# Patient Record
Sex: Female | Born: 1940 | Race: Black or African American | Hispanic: No | Smoking: Former smoker
Health system: Southern US, Community
[De-identification: ages and names within clinical notes are randomized; demographics above are authoritative.]

## PROBLEM LIST (undated history)

## (undated) DIAGNOSIS — I10 Essential (primary) hypertension: Secondary | ICD-10-CM

## (undated) DIAGNOSIS — R609 Edema, unspecified: Secondary | ICD-10-CM

## (undated) DIAGNOSIS — I2510 Atherosclerotic heart disease of native coronary artery without angina pectoris: Secondary | ICD-10-CM

## (undated) DIAGNOSIS — G609 Hereditary and idiopathic neuropathy, unspecified: Secondary | ICD-10-CM

## (undated) DIAGNOSIS — C8590 Non-Hodgkin lymphoma, unspecified, unspecified site: Secondary | ICD-10-CM

## (undated) DIAGNOSIS — K219 Gastro-esophageal reflux disease without esophagitis: Secondary | ICD-10-CM

## (undated) DIAGNOSIS — I739 Peripheral vascular disease, unspecified: Secondary | ICD-10-CM

## (undated) DIAGNOSIS — I509 Heart failure, unspecified: Secondary | ICD-10-CM

## (undated) DIAGNOSIS — E7800 Pure hypercholesterolemia, unspecified: Secondary | ICD-10-CM

## (undated) DIAGNOSIS — R1902 Left upper quadrant abdominal swelling, mass and lump: Secondary | ICD-10-CM

## (undated) DIAGNOSIS — I482 Chronic atrial fibrillation, unspecified: Secondary | ICD-10-CM

## (undated) DIAGNOSIS — M109 Gout, unspecified: Secondary | ICD-10-CM

## (undated) DIAGNOSIS — N189 Chronic kidney disease, unspecified: Secondary | ICD-10-CM

## (undated) DIAGNOSIS — Q2543 Congenital aneurysm of aorta: Secondary | ICD-10-CM

## (undated) DIAGNOSIS — Q2549 Other congenital malformations of aorta: Secondary | ICD-10-CM

## (undated) DIAGNOSIS — F0150 Vascular dementia without behavioral disturbance: Secondary | ICD-10-CM

## (undated) DIAGNOSIS — H548 Legal blindness, as defined in USA: Secondary | ICD-10-CM

## (undated) DIAGNOSIS — R32 Unspecified urinary incontinence: Secondary | ICD-10-CM

## (undated) DIAGNOSIS — I639 Cerebral infarction, unspecified: Secondary | ICD-10-CM

## (undated) DIAGNOSIS — R569 Unspecified convulsions: Secondary | ICD-10-CM

## (undated) HISTORY — DX: Pure hypercholesterolemia, unspecified: E78.00

## (undated) HISTORY — DX: Left upper quadrant abdominal swelling, mass and lump: R19.02

## (undated) HISTORY — DX: Hereditary and idiopathic neuropathy, unspecified: G60.9

## (undated) HISTORY — DX: Essential (primary) hypertension: I10

## (undated) HISTORY — DX: Unspecified convulsions: R56.9

## (undated) HISTORY — PX: OTHER SURGICAL HISTORY: SHX169

## (undated) HISTORY — DX: Unspecified urinary incontinence: R32

## (undated) HISTORY — DX: Peripheral vascular disease, unspecified: I73.9

## (undated) HISTORY — DX: Atherosclerotic heart disease of native coronary artery without angina pectoris: I25.10

## (undated) HISTORY — DX: Vascular dementia, unspecified severity, without behavioral disturbance, psychotic disturbance, mood disturbance, and anxiety: F01.50

## (undated) HISTORY — DX: Legal blindness, as defined in USA: H54.8

## (undated) HISTORY — DX: Gout, unspecified: M10.9

## (undated) HISTORY — DX: Cerebral infarction, unspecified: I63.9

## (undated) HISTORY — DX: Edema, unspecified: R60.9

## (undated) HISTORY — DX: Chronic kidney disease, unspecified: N18.9

## (undated) HISTORY — DX: Heart failure, unspecified: I50.9

## (undated) HISTORY — PX: TUBAL LIGATION: SHX77

## (undated) HISTORY — DX: Chronic atrial fibrillation, unspecified: I48.20

---

## 1997-08-12 ENCOUNTER — Other Ambulatory Visit: Admission: RE | Admit: 1997-08-12 | Discharge: 1997-08-12 | Payer: Self-pay | Admitting: Obstetrics and Gynecology

## 1997-08-27 ENCOUNTER — Ambulatory Visit (HOSPITAL_COMMUNITY): Admission: RE | Admit: 1997-08-27 | Discharge: 1997-08-27 | Payer: Self-pay | Admitting: Hematology and Oncology

## 1997-08-27 ENCOUNTER — Ambulatory Visit (HOSPITAL_COMMUNITY): Admission: RE | Admit: 1997-08-27 | Discharge: 1997-08-27 | Payer: Self-pay | Admitting: Gastroenterology

## 1997-09-29 ENCOUNTER — Encounter: Payer: Self-pay | Admitting: Cardiology

## 1997-09-29 ENCOUNTER — Emergency Department (HOSPITAL_COMMUNITY): Admission: EM | Admit: 1997-09-29 | Discharge: 1997-09-29 | Payer: Self-pay | Admitting: Emergency Medicine

## 1998-04-24 ENCOUNTER — Inpatient Hospital Stay (HOSPITAL_COMMUNITY): Admission: EM | Admit: 1998-04-24 | Discharge: 1998-04-29 | Payer: Self-pay | Admitting: Emergency Medicine

## 1998-04-24 ENCOUNTER — Encounter: Payer: Self-pay | Admitting: Emergency Medicine

## 1998-04-27 ENCOUNTER — Encounter: Payer: Self-pay | Admitting: Cardiology

## 1999-03-11 ENCOUNTER — Inpatient Hospital Stay (HOSPITAL_COMMUNITY): Admission: EM | Admit: 1999-03-11 | Discharge: 1999-03-15 | Payer: Self-pay | Admitting: Emergency Medicine

## 1999-03-11 ENCOUNTER — Encounter: Payer: Self-pay | Admitting: Emergency Medicine

## 1999-03-12 ENCOUNTER — Encounter: Payer: Self-pay | Admitting: Cardiology

## 1999-03-13 ENCOUNTER — Encounter: Payer: Self-pay | Admitting: Cardiology

## 1999-06-07 ENCOUNTER — Encounter: Admission: RE | Admit: 1999-06-07 | Discharge: 1999-06-07 | Payer: Self-pay | Admitting: Hematology and Oncology

## 1999-06-07 ENCOUNTER — Encounter: Payer: Self-pay | Admitting: Hematology and Oncology

## 1999-07-07 ENCOUNTER — Other Ambulatory Visit: Admission: RE | Admit: 1999-07-07 | Discharge: 1999-07-07 | Payer: Self-pay | Admitting: Obstetrics and Gynecology

## 2001-08-22 ENCOUNTER — Emergency Department (HOSPITAL_COMMUNITY): Admission: EM | Admit: 2001-08-22 | Discharge: 2001-08-22 | Payer: Self-pay | Admitting: *Deleted

## 2001-08-22 ENCOUNTER — Encounter: Payer: Self-pay | Admitting: *Deleted

## 2001-10-19 ENCOUNTER — Inpatient Hospital Stay (HOSPITAL_COMMUNITY): Admission: EM | Admit: 2001-10-19 | Discharge: 2001-10-25 | Payer: Self-pay | Admitting: Emergency Medicine

## 2001-10-20 ENCOUNTER — Encounter: Payer: Self-pay | Admitting: Cardiology

## 2001-10-27 ENCOUNTER — Inpatient Hospital Stay (HOSPITAL_COMMUNITY): Admission: EM | Admit: 2001-10-27 | Discharge: 2001-10-30 | Payer: Self-pay | Admitting: Emergency Medicine

## 2001-10-27 ENCOUNTER — Encounter: Payer: Self-pay | Admitting: Emergency Medicine

## 2001-10-28 ENCOUNTER — Encounter: Payer: Self-pay | Admitting: Cardiology

## 2001-10-30 ENCOUNTER — Inpatient Hospital Stay (HOSPITAL_COMMUNITY)
Admission: RE | Admit: 2001-10-30 | Discharge: 2001-11-25 | Payer: Self-pay | Admitting: Physical Medicine & Rehabilitation

## 2001-11-23 ENCOUNTER — Encounter: Payer: Self-pay | Admitting: Physical Medicine & Rehabilitation

## 2001-12-23 ENCOUNTER — Encounter: Admission: RE | Admit: 2001-12-23 | Discharge: 2002-02-11 | Payer: Self-pay | Admitting: Cardiology

## 2002-01-04 ENCOUNTER — Encounter: Payer: Self-pay | Admitting: Emergency Medicine

## 2002-01-04 ENCOUNTER — Inpatient Hospital Stay (HOSPITAL_COMMUNITY): Admission: EM | Admit: 2002-01-04 | Discharge: 2002-01-12 | Payer: Self-pay | Admitting: Emergency Medicine

## 2002-01-05 ENCOUNTER — Encounter: Payer: Self-pay | Admitting: Cardiology

## 2002-01-06 ENCOUNTER — Encounter: Payer: Self-pay | Admitting: Cardiology

## 2002-02-12 ENCOUNTER — Encounter
Admission: RE | Admit: 2002-02-12 | Discharge: 2002-05-13 | Payer: Self-pay | Admitting: Physical Medicine & Rehabilitation

## 2002-03-24 ENCOUNTER — Ambulatory Visit (HOSPITAL_COMMUNITY)
Admission: RE | Admit: 2002-03-24 | Discharge: 2002-03-24 | Payer: Self-pay | Admitting: Physical Medicine & Rehabilitation

## 2002-05-14 ENCOUNTER — Encounter
Admission: RE | Admit: 2002-05-14 | Discharge: 2002-08-12 | Payer: Self-pay | Admitting: Physical Medicine & Rehabilitation

## 2002-05-27 ENCOUNTER — Inpatient Hospital Stay (HOSPITAL_COMMUNITY): Admission: AD | Admit: 2002-05-27 | Discharge: 2002-06-08 | Payer: Self-pay | Admitting: *Deleted

## 2002-05-27 ENCOUNTER — Encounter: Payer: Self-pay | Admitting: *Deleted

## 2002-05-30 HISTORY — PX: FEMORAL-POPLITEAL BYPASS GRAFT: SHX937

## 2002-06-08 ENCOUNTER — Encounter: Payer: Self-pay | Admitting: *Deleted

## 2002-06-08 ENCOUNTER — Inpatient Hospital Stay: Admission: RE | Admit: 2002-06-08 | Discharge: 2002-06-20 | Payer: Self-pay | Admitting: Internal Medicine

## 2002-06-16 ENCOUNTER — Encounter: Payer: Self-pay | Admitting: Internal Medicine

## 2002-06-16 ENCOUNTER — Encounter (HOSPITAL_COMMUNITY): Admission: RE | Admit: 2002-06-16 | Discharge: 2002-06-18 | Payer: Self-pay | Admitting: Internal Medicine

## 2002-07-03 ENCOUNTER — Ambulatory Visit (HOSPITAL_COMMUNITY): Admission: RE | Admit: 2002-07-03 | Discharge: 2002-07-03 | Payer: Self-pay | Admitting: Internal Medicine

## 2002-07-06 ENCOUNTER — Encounter: Admission: RE | Admit: 2002-07-06 | Discharge: 2002-10-04 | Payer: Self-pay | Admitting: *Deleted

## 2002-07-14 ENCOUNTER — Encounter
Admission: RE | Admit: 2002-07-14 | Discharge: 2002-10-12 | Payer: Self-pay | Admitting: Physical Medicine & Rehabilitation

## 2002-07-28 ENCOUNTER — Encounter (HOSPITAL_BASED_OUTPATIENT_CLINIC_OR_DEPARTMENT_OTHER): Admission: RE | Admit: 2002-07-28 | Discharge: 2002-10-26 | Payer: Self-pay | Admitting: Internal Medicine

## 2002-10-05 ENCOUNTER — Encounter: Payer: Self-pay | Admitting: Emergency Medicine

## 2002-10-06 ENCOUNTER — Inpatient Hospital Stay (HOSPITAL_COMMUNITY): Admission: EM | Admit: 2002-10-06 | Discharge: 2002-10-14 | Payer: Self-pay | Admitting: Emergency Medicine

## 2002-10-08 ENCOUNTER — Encounter: Payer: Self-pay | Admitting: Cardiology

## 2002-10-30 ENCOUNTER — Encounter (HOSPITAL_BASED_OUTPATIENT_CLINIC_OR_DEPARTMENT_OTHER): Admission: RE | Admit: 2002-10-30 | Discharge: 2003-01-28 | Payer: Self-pay | Admitting: Internal Medicine

## 2002-11-30 HISTORY — PX: FEMORAL-POPLITEAL BYPASS GRAFT: SHX937

## 2002-12-09 ENCOUNTER — Inpatient Hospital Stay (HOSPITAL_COMMUNITY): Admission: AD | Admit: 2002-12-09 | Discharge: 2002-12-22 | Payer: Self-pay | Admitting: *Deleted

## 2003-02-11 ENCOUNTER — Encounter (HOSPITAL_BASED_OUTPATIENT_CLINIC_OR_DEPARTMENT_OTHER): Admission: RE | Admit: 2003-02-11 | Discharge: 2003-03-12 | Payer: Self-pay | Admitting: Internal Medicine

## 2003-05-30 ENCOUNTER — Emergency Department (HOSPITAL_COMMUNITY): Admission: EM | Admit: 2003-05-30 | Discharge: 2003-05-31 | Payer: Self-pay | Admitting: Emergency Medicine

## 2003-06-02 ENCOUNTER — Encounter (HOSPITAL_BASED_OUTPATIENT_CLINIC_OR_DEPARTMENT_OTHER): Admission: RE | Admit: 2003-06-02 | Discharge: 2003-06-18 | Payer: Self-pay | Admitting: Internal Medicine

## 2003-09-15 ENCOUNTER — Encounter (HOSPITAL_BASED_OUTPATIENT_CLINIC_OR_DEPARTMENT_OTHER): Admission: RE | Admit: 2003-09-15 | Discharge: 2003-10-01 | Payer: Self-pay | Admitting: Internal Medicine

## 2003-12-01 ENCOUNTER — Ambulatory Visit (HOSPITAL_COMMUNITY): Admission: RE | Admit: 2003-12-01 | Discharge: 2003-12-01 | Payer: Self-pay | Admitting: Gastroenterology

## 2003-12-01 ENCOUNTER — Encounter (INDEPENDENT_AMBULATORY_CARE_PROVIDER_SITE_OTHER): Payer: Self-pay | Admitting: *Deleted

## 2003-12-28 ENCOUNTER — Encounter (HOSPITAL_BASED_OUTPATIENT_CLINIC_OR_DEPARTMENT_OTHER): Admission: RE | Admit: 2003-12-28 | Discharge: 2004-01-18 | Payer: Self-pay | Admitting: Internal Medicine

## 2004-04-05 ENCOUNTER — Encounter (HOSPITAL_BASED_OUTPATIENT_CLINIC_OR_DEPARTMENT_OTHER): Admission: RE | Admit: 2004-04-05 | Discharge: 2004-04-14 | Payer: Self-pay | Admitting: Internal Medicine

## 2006-03-12 ENCOUNTER — Emergency Department (HOSPITAL_COMMUNITY): Admission: EM | Admit: 2006-03-12 | Discharge: 2006-03-13 | Payer: Self-pay | Admitting: Emergency Medicine

## 2006-07-19 ENCOUNTER — Ambulatory Visit: Payer: Self-pay | Admitting: *Deleted

## 2007-02-06 ENCOUNTER — Ambulatory Visit: Payer: Self-pay | Admitting: *Deleted

## 2007-08-07 ENCOUNTER — Ambulatory Visit: Payer: Self-pay | Admitting: *Deleted

## 2007-11-06 ENCOUNTER — Encounter: Admission: RE | Admit: 2007-11-06 | Discharge: 2007-11-06 | Payer: Self-pay | Admitting: *Deleted

## 2008-02-12 ENCOUNTER — Ambulatory Visit: Payer: Self-pay | Admitting: *Deleted

## 2008-02-18 ENCOUNTER — Ambulatory Visit (HOSPITAL_COMMUNITY): Admission: RE | Admit: 2008-02-18 | Discharge: 2008-02-18 | Payer: Self-pay | Admitting: *Deleted

## 2008-02-18 ENCOUNTER — Ambulatory Visit: Payer: Self-pay | Admitting: *Deleted

## 2008-04-01 ENCOUNTER — Ambulatory Visit: Payer: Self-pay | Admitting: *Deleted

## 2008-08-26 ENCOUNTER — Ambulatory Visit: Payer: Self-pay | Admitting: *Deleted

## 2008-09-01 ENCOUNTER — Ambulatory Visit (HOSPITAL_COMMUNITY): Admission: RE | Admit: 2008-09-01 | Discharge: 2008-09-01 | Payer: Self-pay | Admitting: *Deleted

## 2008-09-01 ENCOUNTER — Ambulatory Visit: Payer: Self-pay | Admitting: *Deleted

## 2008-09-01 HISTORY — PX: AORTOGRAM: SHX6300

## 2008-09-03 ENCOUNTER — Inpatient Hospital Stay (HOSPITAL_COMMUNITY): Admission: RE | Admit: 2008-09-03 | Discharge: 2008-09-09 | Payer: Self-pay | Admitting: Vascular Surgery

## 2008-09-04 ENCOUNTER — Encounter: Payer: Self-pay | Admitting: Vascular Surgery

## 2008-09-28 ENCOUNTER — Ambulatory Visit: Payer: Self-pay | Admitting: Vascular Surgery

## 2008-12-08 ENCOUNTER — Inpatient Hospital Stay (HOSPITAL_COMMUNITY): Admission: RE | Admit: 2008-12-08 | Discharge: 2008-12-15 | Payer: Self-pay | Admitting: Emergency Medicine

## 2008-12-10 ENCOUNTER — Encounter (INDEPENDENT_AMBULATORY_CARE_PROVIDER_SITE_OTHER): Payer: Self-pay | Admitting: Neurology

## 2008-12-13 ENCOUNTER — Ambulatory Visit: Payer: Self-pay | Admitting: Physical Medicine & Rehabilitation

## 2008-12-13 ENCOUNTER — Encounter (INDEPENDENT_AMBULATORY_CARE_PROVIDER_SITE_OTHER): Payer: Self-pay | Admitting: Neurology

## 2009-03-16 ENCOUNTER — Ambulatory Visit: Payer: Self-pay | Admitting: Vascular Surgery

## 2009-03-22 ENCOUNTER — Ambulatory Visit: Payer: Self-pay | Admitting: Vascular Surgery

## 2009-09-06 ENCOUNTER — Ambulatory Visit: Payer: Self-pay | Admitting: Vascular Surgery

## 2009-09-14 ENCOUNTER — Ambulatory Visit: Payer: Self-pay | Admitting: Cardiology

## 2009-09-29 ENCOUNTER — Ambulatory Visit: Payer: Self-pay | Admitting: Cardiology

## 2009-10-31 ENCOUNTER — Ambulatory Visit: Payer: Self-pay | Admitting: Cardiology

## 2009-11-29 ENCOUNTER — Ambulatory Visit: Payer: Self-pay | Admitting: Cardiology

## 2009-12-13 ENCOUNTER — Ambulatory Visit: Payer: Self-pay | Admitting: Cardiology

## 2009-12-30 ENCOUNTER — Ambulatory Visit: Payer: Self-pay | Admitting: Cardiology

## 2010-01-18 ENCOUNTER — Ambulatory Visit: Payer: Self-pay | Admitting: Cardiology

## 2010-01-26 ENCOUNTER — Ambulatory Visit: Payer: Self-pay | Admitting: Cardiology

## 2010-02-01 ENCOUNTER — Ambulatory Visit: Payer: Self-pay | Admitting: Cardiology

## 2010-02-08 ENCOUNTER — Ambulatory Visit: Payer: Self-pay | Admitting: Cardiology

## 2010-02-20 ENCOUNTER — Ambulatory Visit: Payer: Self-pay | Admitting: Cardiology

## 2010-02-27 ENCOUNTER — Ambulatory Visit: Payer: Self-pay | Admitting: Cardiology

## 2010-03-13 ENCOUNTER — Encounter (INDEPENDENT_AMBULATORY_CARE_PROVIDER_SITE_OTHER): Payer: Medicare Other

## 2010-03-13 DIAGNOSIS — I119 Hypertensive heart disease without heart failure: Secondary | ICD-10-CM

## 2010-03-13 DIAGNOSIS — I4891 Unspecified atrial fibrillation: Secondary | ICD-10-CM

## 2010-03-28 ENCOUNTER — Ambulatory Visit (INDEPENDENT_AMBULATORY_CARE_PROVIDER_SITE_OTHER): Payer: Medicare Other | Admitting: Cardiology

## 2010-03-28 DIAGNOSIS — Z7901 Long term (current) use of anticoagulants: Secondary | ICD-10-CM

## 2010-03-28 DIAGNOSIS — I4891 Unspecified atrial fibrillation: Secondary | ICD-10-CM

## 2010-03-28 DIAGNOSIS — I6350 Cerebral infarction due to unspecified occlusion or stenosis of unspecified cerebral artery: Secondary | ICD-10-CM

## 2010-03-28 DIAGNOSIS — I119 Hypertensive heart disease without heart failure: Secondary | ICD-10-CM

## 2010-04-26 ENCOUNTER — Ambulatory Visit (INDEPENDENT_AMBULATORY_CARE_PROVIDER_SITE_OTHER): Payer: Medicare Other | Admitting: *Deleted

## 2010-04-26 DIAGNOSIS — I4891 Unspecified atrial fibrillation: Secondary | ICD-10-CM | POA: Insufficient documentation

## 2010-04-26 DIAGNOSIS — Z7901 Long term (current) use of anticoagulants: Secondary | ICD-10-CM

## 2010-04-26 LAB — POCT INR: INR: 2.1

## 2010-05-02 ENCOUNTER — Telehealth: Payer: Self-pay | Admitting: Cardiology

## 2010-05-02 NOTE — Telephone Encounter (Signed)
PT SAID LEFT SOME PAPERS TO BE FILLED OUT AND JUST CHECKING THE STATUS.

## 2010-05-03 LAB — GLUCOSE, CAPILLARY
Glucose-Capillary: 108 mg/dL — ABNORMAL HIGH (ref 70–99)
Glucose-Capillary: 115 mg/dL — ABNORMAL HIGH (ref 70–99)
Glucose-Capillary: 132 mg/dL — ABNORMAL HIGH (ref 70–99)
Glucose-Capillary: 134 mg/dL — ABNORMAL HIGH (ref 70–99)
Glucose-Capillary: 144 mg/dL — ABNORMAL HIGH (ref 70–99)
Glucose-Capillary: 107 mg/dL — ABNORMAL HIGH (ref 70–99)
Glucose-Capillary: 120 mg/dL — ABNORMAL HIGH (ref 70–99)
Glucose-Capillary: 124 mg/dL — ABNORMAL HIGH (ref 70–99)
Glucose-Capillary: 134 mg/dL — ABNORMAL HIGH (ref 70–99)
Glucose-Capillary: 137 mg/dL — ABNORMAL HIGH (ref 70–99)
Glucose-Capillary: 139 mg/dL — ABNORMAL HIGH (ref 70–99)
Glucose-Capillary: 139 mg/dL — ABNORMAL HIGH (ref 70–99)
Glucose-Capillary: 141 mg/dL — ABNORMAL HIGH (ref 70–99)
Glucose-Capillary: 143 mg/dL — ABNORMAL HIGH (ref 70–99)
Glucose-Capillary: 146 mg/dL — ABNORMAL HIGH (ref 70–99)
Glucose-Capillary: 173 mg/dL — ABNORMAL HIGH (ref 70–99)
Glucose-Capillary: 183 mg/dL — ABNORMAL HIGH (ref 70–99)
Glucose-Capillary: 76 mg/dL (ref 70–99)

## 2010-05-03 LAB — COMPREHENSIVE METABOLIC PANEL
ALT: 16 U/L (ref 0–35)
AST: 23 U/L (ref 0–37)
Albumin: 3.7 g/dL (ref 3.5–5.2)
Alkaline Phosphatase: 68 U/L (ref 39–117)
BUN: 19 mg/dL (ref 6–23)
CO2: 25 mEq/L (ref 19–32)
Calcium: 9.5 mg/dL (ref 8.4–10.5)
Chloride: 107 mEq/L (ref 96–112)
Creatinine, Ser: 1.13 mg/dL (ref 0.4–1.2)
GFR calc Af Amer: 58 mL/min — ABNORMAL LOW (ref >60)
GFR calc non Af Amer: 48 mL/min — ABNORMAL LOW (ref >60)
Glucose, Bld: 181 mg/dL — ABNORMAL HIGH (ref 70–99)
Potassium: 4.9 mEq/L (ref 3.5–5.1)
Sodium: 137 mEq/L (ref 135–145)
Total Bilirubin: 0.5 mg/dL (ref 0.3–1.2)
Total Protein: 7.4 g/dL (ref 6.0–8.3)

## 2010-05-03 LAB — CBC
HCT: 39.3 % (ref 36.0–46.0)
Hemoglobin: 13 g/dL (ref 12.0–15.0)
Hemoglobin: 13.9 g/dL (ref 12.0–15.0)
MCHC: 33.1 g/dL (ref 30.0–36.0)
MCV: 84.5 fL (ref 78.0–100.0)
MCV: 85.1 fL (ref 78.0–100.0)
Platelets: 148 10*3/uL — ABNORMAL LOW (ref 150–400)
RBC: 4.61 MIL/uL (ref 3.87–5.11)
RBC: 4.63 MIL/uL (ref 3.87–5.11)
RBC: 4.98 MIL/uL (ref 3.87–5.11)
RDW: 16 % — ABNORMAL HIGH (ref 11.5–15.5)
WBC: 3.6 10*3/uL — ABNORMAL LOW (ref 4.0–10.5)
WBC: 4.5 10*3/uL (ref 4.0–10.5)
WBC: 4.6 10*3/uL (ref 4.0–10.5)

## 2010-05-03 LAB — HEMOGLOBIN A1C
Hgb A1c MFr Bld: 7.4 % — ABNORMAL HIGH (ref 4.6–6.1)
Mean Plasma Glucose: 166 mg/dL

## 2010-05-03 LAB — DIFFERENTIAL
Eosinophils Absolute: 0.2 10*3/uL (ref 0.0–0.7)
Eosinophils Relative: 3 % (ref 0–5)
Lymphs Abs: 1.5 10*3/uL (ref 0.7–4.0)
Monocytes Relative: 13 % — ABNORMAL HIGH (ref 3–12)

## 2010-05-03 LAB — PROTIME-INR
INR: 1.96 — ABNORMAL HIGH (ref 0.00–1.49)
INR: 2.14 — ABNORMAL HIGH (ref 0.00–1.49)
INR: 1.1 (ref 0.00–1.49)
INR: 1.15 (ref 0.00–1.49)
INR: 1.57 — ABNORMAL HIGH (ref 0.00–1.49)
Prothrombin Time: 22.2 seconds — ABNORMAL HIGH (ref 11.6–15.2)
Prothrombin Time: 23.7 seconds — ABNORMAL HIGH (ref 11.6–15.2)
Prothrombin Time: 14.6 seconds (ref 11.6–15.2)
Prothrombin Time: 16.2 seconds — ABNORMAL HIGH (ref 11.6–15.2)
Prothrombin Time: 18.6 seconds — ABNORMAL HIGH (ref 11.6–15.2)

## 2010-05-03 LAB — APTT
aPTT: 28 seconds (ref 24–37)
aPTT: 30 seconds (ref 24–37)

## 2010-05-03 LAB — URINALYSIS, ROUTINE W REFLEX MICROSCOPIC
Bilirubin Urine: NEGATIVE
Glucose, UA: NEGATIVE mg/dL
Hgb urine dipstick: NEGATIVE
Ketones, ur: NEGATIVE mg/dL
Nitrite: NEGATIVE
Protein, ur: NEGATIVE mg/dL
Specific Gravity, Urine: 1.009 (ref 1.005–1.030)
Urobilinogen, UA: 0.2 mg/dL (ref 0.0–1.0)
pH: 5 (ref 5.0–8.0)

## 2010-05-03 LAB — CK TOTAL AND CKMB (NOT AT ARMC)
CK, MB: 1.3 ng/mL (ref 0.3–4.0)
Relative Index: INVALID (ref 0.0–2.5)
Total CK: 93 U/L (ref 7–177)

## 2010-05-03 LAB — LIPID PANEL
Cholesterol: 208 mg/dL — ABNORMAL HIGH (ref 0–200)
HDL: 53 mg/dL (ref >39)
LDL Cholesterol: 126 mg/dL — ABNORMAL HIGH (ref 0–99)
Total CHOL/HDL Ratio: 3.9 RATIO
Triglycerides: 146 mg/dL (ref <150)
VLDL: 29 mg/dL (ref 0–40)

## 2010-05-03 LAB — BASIC METABOLIC PANEL
Calcium: 9.8 mg/dL (ref 8.4–10.5)
Chloride: 105 mEq/L (ref 96–112)
Creatinine, Ser: 1.07 mg/dL (ref 0.4–1.2)
GFR calc Af Amer: >60 mL/min (ref >60)
Sodium: 139 mEq/L (ref 135–145)

## 2010-05-03 LAB — MRSA PCR SCREENING: MRSA by PCR: NEGATIVE

## 2010-05-03 LAB — TROPONIN I: Troponin I: 0.03 ng/mL (ref 0.00–0.06)

## 2010-05-07 LAB — URINALYSIS, ROUTINE W REFLEX MICROSCOPIC
Bilirubin Urine: NEGATIVE
Glucose, UA: 250 mg/dL — AB
Ketones, ur: 15 mg/dL — AB
Ketones, ur: NEGATIVE mg/dL
Nitrite: NEGATIVE
Protein, ur: NEGATIVE mg/dL
Urobilinogen, UA: 1 mg/dL (ref 0.0–1.0)
pH: 6 (ref 5.0–8.0)

## 2010-05-07 LAB — COMPREHENSIVE METABOLIC PANEL
ALT: 23 U/L (ref 0–35)
Calcium: 9.8 mg/dL (ref 8.4–10.5)
GFR calc Af Amer: 54 mL/min — ABNORMAL LOW (ref >60)
Glucose, Bld: 346 mg/dL — ABNORMAL HIGH (ref 70–99)
Sodium: 136 mEq/L (ref 135–145)
Total Protein: 7 g/dL (ref 6.0–8.3)

## 2010-05-07 LAB — GLUCOSE, CAPILLARY
Glucose-Capillary: 162 mg/dL — ABNORMAL HIGH (ref 70–99)
Glucose-Capillary: 170 mg/dL — ABNORMAL HIGH (ref 70–99)
Glucose-Capillary: 170 mg/dL — ABNORMAL HIGH (ref 70–99)
Glucose-Capillary: 179 mg/dL — ABNORMAL HIGH (ref 70–99)
Glucose-Capillary: 186 mg/dL — ABNORMAL HIGH (ref 70–99)
Glucose-Capillary: 195 mg/dL — ABNORMAL HIGH (ref 70–99)
Glucose-Capillary: 215 mg/dL — ABNORMAL HIGH (ref 70–99)
Glucose-Capillary: 218 mg/dL — ABNORMAL HIGH (ref 70–99)
Glucose-Capillary: 260 mg/dL — ABNORMAL HIGH (ref 70–99)
Glucose-Capillary: 262 mg/dL — ABNORMAL HIGH (ref 70–99)

## 2010-05-07 LAB — BASIC METABOLIC PANEL
BUN: 14 mg/dL (ref 6–23)
CO2: 26 mEq/L (ref 19–32)
Calcium: 9.1 mg/dL (ref 8.4–10.5)
Chloride: 104 mEq/L (ref 96–112)
Creatinine, Ser: 1.03 mg/dL (ref 0.4–1.2)
GFR calc Af Amer: >60 mL/min (ref >60)
GFR calc non Af Amer: 53 mL/min — ABNORMAL LOW (ref >60)
Glucose, Bld: 168 mg/dL — ABNORMAL HIGH (ref 70–99)
Potassium: 4.4 mEq/L (ref 3.5–5.1)
Sodium: 135 mEq/L (ref 135–145)

## 2010-05-07 LAB — PROTIME-INR
INR: 1.3 (ref 0.00–1.49)
INR: 1.5 (ref 0.00–1.49)
INR: 1.7 — ABNORMAL HIGH (ref 0.00–1.49)
INR: 2.2 — ABNORMAL HIGH (ref 0.00–1.49)
Prothrombin Time: 14.2 seconds (ref 11.6–15.2)
Prothrombin Time: 16.2 seconds — ABNORMAL HIGH (ref 11.6–15.2)
Prothrombin Time: 16.9 seconds — ABNORMAL HIGH (ref 11.6–15.2)
Prothrombin Time: 17.9 seconds — ABNORMAL HIGH (ref 11.6–15.2)
Prothrombin Time: 19.9 seconds — ABNORMAL HIGH (ref 11.6–15.2)
Prothrombin Time: 22.1 seconds — ABNORMAL HIGH (ref 11.6–15.2)
Prothrombin Time: 24.3 seconds — ABNORMAL HIGH (ref 11.6–15.2)

## 2010-05-07 LAB — URINE MICROSCOPIC-ADD ON

## 2010-05-07 LAB — TYPE AND SCREEN
ABO/RH(D): B POS
Antibody Screen: NEGATIVE

## 2010-05-07 LAB — CBC
HCT: 40.3 % (ref 36.0–46.0)
Hemoglobin: 13.6 g/dL (ref 12.0–15.0)
Hemoglobin: 13.8 g/dL (ref 12.0–15.0)
MCHC: 33.8 g/dL (ref 30.0–36.0)
MCV: 87 fL (ref 78.0–100.0)
RDW: 14.7 % (ref 11.5–15.5)
RDW: 15.2 % (ref 11.5–15.5)

## 2010-05-07 LAB — POCT I-STAT, CHEM 8
Calcium, Ion: 1.23 mmol/L (ref 1.12–1.32)
Glucose, Bld: 129 mg/dL — ABNORMAL HIGH (ref 70–99)
HCT: 45 % (ref 36.0–46.0)
Hemoglobin: 15.3 g/dL — ABNORMAL HIGH (ref 12.0–15.0)
TCO2: 26 mmol/L (ref 0–100)

## 2010-05-09 ENCOUNTER — Telehealth: Payer: Self-pay | Admitting: Cardiology

## 2010-05-09 NOTE — Telephone Encounter (Signed)
Per Dr. Mart Piggs this matter in the coumadin clinic tomorrow.  L/M with Leanna Battles, RN

## 2010-05-09 NOTE — Telephone Encounter (Signed)
Ms.Malone,RN, wants RN to know that the patient presented today with symptoms of scalp, temporal and jaw pain.  Patient has an appointment tomorrow with the Coumadin clinic. Ms.Malone did not know if this may need to be assessed by Dr.Brackbill's RN or not.  She just wanted to let us know.

## 2010-05-10 ENCOUNTER — Ambulatory Visit (INDEPENDENT_AMBULATORY_CARE_PROVIDER_SITE_OTHER): Payer: Medicare Other | Admitting: *Deleted

## 2010-05-10 DIAGNOSIS — Z7901 Long term (current) use of anticoagulants: Secondary | ICD-10-CM

## 2010-05-10 DIAGNOSIS — I4891 Unspecified atrial fibrillation: Secondary | ICD-10-CM

## 2010-05-15 LAB — GLUCOSE, CAPILLARY
Glucose-Capillary: 130 mg/dL — ABNORMAL HIGH (ref 70–99)
Glucose-Capillary: 83 mg/dL (ref 70–99)

## 2010-05-15 LAB — POCT I-STAT, CHEM 8
BUN: 25 mg/dL — ABNORMAL HIGH (ref 6–23)
Calcium, Ion: 1.22 mmol/L (ref 1.12–1.32)
Chloride: 104 mEq/L (ref 96–112)
Potassium: 3.6 mEq/L (ref 3.5–5.1)
Sodium: 142 mEq/L (ref 135–145)

## 2010-05-15 LAB — PROTIME-INR: INR: 1.6 — ABNORMAL HIGH (ref 0.00–1.49)

## 2010-05-22 ENCOUNTER — Encounter: Payer: Self-pay | Admitting: Cardiology

## 2010-05-22 DIAGNOSIS — I6990 Unspecified sequelae of unspecified cerebrovascular disease: Secondary | ICD-10-CM | POA: Insufficient documentation

## 2010-05-22 DIAGNOSIS — E7800 Pure hypercholesterolemia, unspecified: Secondary | ICD-10-CM | POA: Insufficient documentation

## 2010-05-22 DIAGNOSIS — E0822 Diabetes mellitus due to underlying condition with diabetic chronic kidney disease: Secondary | ICD-10-CM | POA: Insufficient documentation

## 2010-05-22 DIAGNOSIS — I10 Essential (primary) hypertension: Secondary | ICD-10-CM | POA: Insufficient documentation

## 2010-05-22 DIAGNOSIS — I4891 Unspecified atrial fibrillation: Secondary | ICD-10-CM | POA: Insufficient documentation

## 2010-05-29 ENCOUNTER — Ambulatory Visit: Payer: Medicare Other | Admitting: Cardiology

## 2010-05-29 ENCOUNTER — Encounter: Payer: Medicare Other | Admitting: *Deleted

## 2010-05-31 ENCOUNTER — Encounter: Payer: Medicare Other | Admitting: *Deleted

## 2010-06-08 ENCOUNTER — Other Ambulatory Visit: Payer: Self-pay | Admitting: *Deleted

## 2010-06-08 DIAGNOSIS — E7800 Pure hypercholesterolemia, unspecified: Secondary | ICD-10-CM

## 2010-06-08 MED ORDER — SIMVASTATIN 20 MG PO TABS: 20.0000 mg | ORAL_TABLET | Freq: Every day | ORAL | Status: DC

## 2010-06-08 NOTE — Telephone Encounter (Signed)
Faxed refill for simvastatin to [facility]

## 2010-06-09 ENCOUNTER — Ambulatory Visit (INDEPENDENT_AMBULATORY_CARE_PROVIDER_SITE_OTHER): Payer: Medicare Other | Admitting: *Deleted

## 2010-06-09 DIAGNOSIS — I4891 Unspecified atrial fibrillation: Secondary | ICD-10-CM

## 2010-06-09 DIAGNOSIS — Z7901 Long term (current) use of anticoagulants: Secondary | ICD-10-CM

## 2010-06-13 NOTE — H&P (Signed)
Casey [name], Casey [name]             ACCOUNT NO.:  [id number]   MEDICAL RECORD NO.:  [id number]          PATIENT TYPE:  INP   LOCATION:  3316                         FACILITY:  [facility]   PHYSICIAN:  Quita Skye. Hart Rochester, M.D.  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   HISTORY OF PRESENT ILLNESS:  Casey [name] is a 71 year old patient of  Dr. Madilyn Fireman who has undergone an initial left fem-pop bypass in 2004.  She  required a revision with a proximal patch angioplasty following that.  She was found to have decreased flow in January and had a left external  iliac PTA and stent.  She did well with this until July of this year  when it was determined that the left external iliac stent was occluded  through an angiogram done on September 01, 2008.  Duplex scan in the office  showed that the left iliac artery stent flow was not visualized, but the  left fem-pop bypass was patent.  ABIs done at the same time showed on  the right they were stable and on the left, there was decreased flow.  She is admitted for a right-to-left fem-fem bypass, as she had open  iliac and profound femoral arteries on the right with 3+ palpable  femoral pulse.   PAST MEDICAL HISTORY:  1. Significant for peripheral vascular disease as described above.  2. Hypertension.  3. Coronary artery disease.  4. Congestive heart failure.  5. Atrial fib.  6. History of left hemispheric cerebrovascular accident in the past.  7. Diabetes.  8. Chronic renal disease with a creatinine of 1.4 and GFR of 54.  9. Chronic anemia.   PAST SURGICAL HISTORY:  She had a left fem-pop bypass in 2004 and left  iliac stenting in January 2010.   ALLERGIES:  PENICILLIN causes rash.   MEDICATIONS:  1. Lasix 20 mg daily.  2. Amlodipine 2.5 mg  daily.  3. Simvastatin 80 mg daily.  4. Metoprolol 50 mg b.i.d.  5. Ditropan 5 mg 1 in the a.m. and 2 in the p.m.  6. Lanoxin 0.125 mg daily.  7. Klor-Con 10 mEq daily.  8. Colchicine 0.6 mg daily.   SOCIAL HISTORY:  She has never smoked and did not drink any alcohol.   PHYSICAL EXAMINATION:  GENERAL:  This is a well-developed, well-  nourished female, in no acute distress.  HEENT:  Grossly within normal limits.  LUNGS:  Clear.  HEART:  Rate and rhythm is irregular with rate in the 80s.  ABDOMEN:  Soft.  BILATERAL LOWER EXTREMITIES:  She has no left femoral pulse.  On the  right, she has 3+ right femoral pulse, which is palpable.  Both feet are  warm and pink.  She has good motion and sensation in bilateral lower  extremities.   ASSESSMENT:  Left external iliac occlusive disease with patent left  femoral-popliteal bypass.   Plan is to do a  right-to-left fem-fem bypass with exploration of the  left fem-pop bypass and possible thrombectomy.  She will then be  admitted to the hospital and restarted on her Coumadin.      Casey Goo, PA-C      Quita Skye. Hart Rochester, M.D.  Electronically Signed    RR/MEDQ  D:  09/03/2008  T:  09/03/2008  Job:  [id number]

## 2010-06-13 NOTE — Assessment & Plan Note (Signed)
OFFICE VISIT   Casey [name], Casey [name] A  DOB:  May 19, 1940                                       03/22/2009  [id number]#:[id number]   The patient is a 70 year old black female who has known history of  peripheral vascular disease.  She is most recently status post a right  to left femoral-femoral bypass graft by Dr. Josephina Gip on 09/03/2008.  Prior to that she had a left external iliac artery PTA and stenting by  Dr. Amada Kingfisher in January of 2010 and a left fem-pop bypass with vein in  May of 2004 by Dr. Madilyn Fireman followed by a Dacron patch angioplasty of the  proximal vein patch stenosis in November of 2004 also by Dr. Madilyn Fireman.  Her  fem-fem bypass was done secondary to left iliac occlusion with severe  claudication in her left leg threatening her left femoral-popliteal  bypass graft.  She was most recently seen in our office on 09/28/2008.  Her grafts were patent at that time and claudication symptoms were  better and she has been seen on a 6 month basis.  Of note, since that  visit she has been hospitalized for a new left brain stroke.  Today she  continues to deny any claudication symptoms such as calf, leg or buttock  tightness or pain.  She is able to walk half a mile.  She does ambulate  with a cane.  She has had no foot ulcers.  She does report that the  bottom of her right foot continues to have a tingling sensation which  she describes as nerve pain which she has had since surgery that has  not gotten worse and does not interfere with her daily living.   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  She is on anticoagulation therapy for atrial  fibrillation.  She has hypertension, diabetes mellitus type,  coronary  artery disease and congestive heart failure and renal insufficiency,  although her creatinine in November of 2010 was 1.07.   REVIEW OF SYSTEM:  Negative for chest pain, shortness of breath,  hematochezia, dysuria.   On 03/16/2009 she had a bypass graft evaluation in our vascular lab.  This showed an ABI of 0.27 on the right and 0.64 on the left.  These  were both decreased from her previous study from August of 2010 which  showed 0.33 on the right and 0.82 on the left.  Both her femoral-femoral  bypass and left femoral-popliteal bypass were patent.  There were no  significant high velocities that would indicate any severe stenosis.  These tests were also reviewed by Dr. Josephina Gip.   PHYSICAL EXAM FINDINGS:  Vital signs:  Show blood pressure 158/80, heart  rate of 78, respirations 20.  General:  She is a 70 year old black  female, alert, cooperative, in no acute distress.  HEENT:  Head is  normocephalic atraumatic.  Lungs:  Sounds are clear bilaterally.  Heart:  Rate is irregular.  No murmur was auscultated.  She has palpable carotid  pulses.  No bruits were auscultated.  She had trace ankle  edema  bilaterally.  Abdomen:  Soft, nontender and nondistended.  Neurologic:  Shows that she is alert and oriented.  She does have a right  hemiparesis.  Extremities:  Show palpable femoral-femoral bypass graft.  Feet show no evidence of ulcers.  She has monophasic dorsalis pedis  Doppler signals bilaterally.   Overall the patient seems to be doing well following her femoral-femoral  bypass.  She is now living alone.  She is able to walk half a mile and  denies any claudication or rest pain symptoms.  Although her ABIs show  some decrease in numbers the graft remains patent and since she remains  asymptomatic we will continue to follow this on a 6 month basis.  She  will call us sooner if she develops any foot ulcers or new claudication  or rest pain symptoms.   Jerold Coombe, PA   Quita Skye. Hart Rochester, M.D.  Electronically Signed   AWZ/MEDQ  D:  03/22/2009  T:  03/23/2009  Job:  [id number]   cc:   Cassell Clement, M.D.

## 2010-06-13 NOTE — Assessment & Plan Note (Signed)
OFFICE VISIT   JORA, GALLUZZO A  DOB:  October 08, 1940                                       04/01/2008  YQMVH#:[id number]   The patient has most recently undergone a procedure on 02/18/2008 with  left lower extremity arteriography and PTA and stenting of left external  iliac artery.  This was carried out for an at risk left femoral  popliteal bypass graft.  The bypass graft is widely patent.  The  procedure was uneventful without residual stenosis.   The patient does have chronic lower extremity pain.  Most recent ankle  brachial indices measured 0.41 on the right and 0.51 prior to  intervention in the left lower extremity.   PHYSICAL EXAMINATION:  She appears generally well.  Status post CVA.  Blood pressure 107/69, pulse is 72 per minute.  She has intact femoral  pulses bilaterally.  No palpable popliteal, posterior tibial or dorsalis  pedis pulses.   Medications reviewed today.   Plan to follow up per protocol.  Functioning left femoral popliteal  graft.   Balinda Quails, M.D.  Electronically Signed   PGH/MEDQ  D:  04/01/2008  T:  04/02/2008  Job:  [id number]

## 2010-06-13 NOTE — Discharge Summary (Signed)
Casey [name], Casey [name]             ACCOUNT NO.:  [id number]   MEDICAL RECORD NO.:  [id number]          PATIENT TYPE:  INP   LOCATION:  2039                         FACILITY:  [facility]   PHYSICIAN:  Quita Skye. Hart Rochester, M.D.  DATE OF BIRTH:  11-13-1940   DATE OF ADMISSION:  09/03/2008  DATE OF DISCHARGE:                               DISCHARGE SUMMARY   DATE OF DISCHARGE:  Tentatively September 09, 2008.   CHIEF COMPLAINT:  Peripheral vascular disease with left iliac occlusion  and severe claudication of the left lower extremity.   HISTORY OF PRESENT ILLNESS:  Casey [name] is a 70 year old woman who has  undergone initial left fem-pop bypass in 2004.  She had a revision with  proximal patch angioplasty following that.  She was found to have  decreased flow in January and had a left external iliac PTA and  stenting.  She did well with this until July of this year when it was  determined that the left external iliac stent was occluded via angiogram  done in August of 2010.  Duplex scan showed that the left iliac artery  stent flow was not visualized but the left fem-pop bypass was patent.  ABIs done at the same time showed that the right were stable and on the  left there was decreased flow.  She was admitted to the hospital for a  right to left fem-fem bypass.  This was discussed with the patient and  she consented to the procedure.   PAST MEDICAL HISTORY:  1. Significant for peripheral vascular disease as above.  2. Hypertension.  3. Coronary artery disease.  4. Congestive heart failure.  5. Atrial fibrillation.  6. History of left hemispheric stroke.  7. Diabetes.  8. Chronic renal disease.  9. Chronic anemia.   ALLERGIES:  PENICILLIN which causes a rash.   SOCIAL HISTORY:  She has never smoked.  Does not drink any alcohol.   HOSPITAL COURSE:  The patient was admitted to the hospital on September 03, 2008, for a right to left fem-fem bypass with exploration of left fem-  pop bypass  and it was found that the left limb of the fem bypass was  occluded.  Postoperatively she had good flow to the left leg with  Doppler signal.  In the left lower extremity her leg was warm and pink.  She had a 3+ fem-fem graft pulse.  The wounds were healing well.  Hemoglobin and hematocrit and electrolytes remained stable.  She  complained of some burning pain in the left thigh to the knee which was  felt to be femoral nerve pain.  She was put on Lovenox and Coumadin,  PT/INR were followed daily and adjustments made appropriately.  Wounds  continued to heal well.  She on the right had a positive AP Doppler  signal.  On the left she had DP Doppler signal.  She was begun on  physical therapy and was slow to move.  The Foley was discontinued and  then reinserted for inability to void, however, she remained afebrile  and had no urinary tract symptoms.  She was  slow to move with physical  therapy secondary to pain in the left thigh.  Her feet continued to be  warm and pink with good perfusion.  Fem-fem bypass remained patent.  By  September 08, 2008, her INR was 2.0.  Her insulin was increased to 20 units  of Lantus daily for fasting glucose greater than 200.  She will be  discharged to rehab facility for physical therapy.   DISPOSITION:  1. The patient will be discharged to skilled nursing facility and      rehab when bed available with hope of returning home in a couple of      weeks when she is ambulating well.  2. She will follow up with Dr. Hart Rochester in 2 weeks post discharge.  3. She will continue her insulin coverage and sliding scale per      protocol.  4. She will continue on Coumadin and PT/INRs checked weekly per      nursing home protocol.   DISCHARGE MEDICATIONS:  1. Lasix 20 mg daily.  2. Amlodipine 2.5 mg daily.  3. Simvastatin 80 mg q.p.m.  4. Metoprolol 50 mg b.i.d.  5. Ditropan 5 mg in the morning and 10 mg in the evening.  6. Lanoxin 0.125 mg daily.  7. Klor-Con 10 mEq  daily.  8. Colchicine 0.6 mg daily.  9. Coumadin 6 mg every Monday, Wednesday, Friday.  Coumadin 3 mg every      Tuesday, Thursday, Saturday and Sunday.  10.Gabapentin 200 mg p.o. t.i.d.  11.Lantus 20 units daily subcutaneous.  12.A prescription for oxycodone 5 mg one to two tabs q.4 hours p.r.n.      pain #40 was given and sent home with the patient.   DISCHARGE DIAGNOSES:  1. Peripheral vascular disease and severe claudication with left iliac      artery occlusion.  Status post right to left fem-fem bypass with      good perfusion of leg.  2. Hypertension.  3. Coronary artery disease.  4. Congestive heart failure.  5. Atrial fibrillation.  6. History of left hemispheric stroke.  7. Diabetes.  8. Chronic renal disease with creatinine 1.4, GFR of 54.  9. Chronic anemia.     Della Goo, PA-C      Quita Skye. Hart Rochester, M.D.  Electronically Signed   RR/MEDQ  D:  09/08/2008  T:  09/08/2008  Job:  [id number]

## 2010-06-13 NOTE — Assessment & Plan Note (Signed)
OFFICE VISIT   Casey [name], Casey [name] A  DOB:  [birth year]                                       09/28/2008  [id number]#:[id number]   The patient returns 3 weeks post right to left femoral-femoral bypass  grafting.  She had a threatened left femoral popliteal vein graft which  has remained patent.  The incisions have healed nicely and she continues  to have an excellent pulse in the femoral-femoral graft and an excellent  popliteal pulse in the left leg.  She states the left leg feels much  better.  She has been in the [facility] rehab facility and she feels that  she is ready to return home.  She was living independently at home  preoperatively.   PHYSICAL EXAMINATION:  Vital signs:  Today reveal a blood pressure of  124/74, heart rate 73, respirations 14.   Hopefully she will be able to return home soon.  I would ask the  facility to consider that in the week or two for her to return to her  normal preoperative living state.  We will continue to follow her left  femoral-popliteal vein graft on our protocol.   Quita Skye Hart Rochester, M.D.  Electronically Signed   JDL/MEDQ  D:  09/28/2008  T:  09/29/2008  Job:  [id number]

## 2010-06-13 NOTE — Assessment & Plan Note (Signed)
OFFICE VISIT   TAHARI, CLABAUGH A  DOB:  October 23, 1940                                       08/26/2008  VWUJW#:[id number]   The patient has a left femoral-popliteal bypass graft which was  initially placed in 2004.  She required revision with proximal patch  angioplasty to follow this.  She has had a left external iliac PTA and  stent performed in January of this year.  Duplex evaluation at this time  reveals suspicion that the left external iliac stent is occluded and  this places at risk her bypass graft which appears to be patent,  however, very slow flow noted.   I have set her up for this Wednesday 09/01/2008 for a left lower  extremity arteriogram and possible reintervention.  She will discontinue  her Coumadin on Saturday July 31 with plan to start Lovenox on the  following 2 days.   Balinda Quails, M.D.  Electronically Signed   PGH/MEDQ  D:  08/26/2008  T:  08/27/2008  Job:  [id number]

## 2010-06-13 NOTE — Procedures (Signed)
BYPASS GRAFT EVALUATION   INDICATION:  Followup evaluation of left lower extremity bypass graft.  The patient complains of bilateral claudication and questionable right  lower extremity rest pain.   HISTORY:  Diabetes:  Yes.  Cardiac:  CAD, atrial fibrillation, CHF.  Hypertension:  Yes.  Smoking:  No.  Previous Surgery:  Repair of vein graft stenosis on 12/15/2002.  Left  distal external iliac artery-popliteal artery BPG with reversed  saphenous vein on 12/05/2002 by Dr. Madilyn Fireman.   SINGLE LEVEL ARTERIAL EXAM                               RIGHT              LEFT  Brachial:                    132                136  Anterior tibial:             44                 100 (0.74)  Posterior tibial:            Barely audible     98  Peroneal:                    54 (0.40)  Ankle/brachial index:        0.40               0.74   PREVIOUS ABI:  Date:  02/06/2007  RIGHT:  0.37  LEFT:  0.72   LOWER EXTREMITY BYPASS GRAFT DUPLEX EXAM:   DUPLEX:     1. Elevated peak systolic velocity of 213 cm/second noted in native        distal external iliac artery which is stable from previous        studies.     2. Patent left DEIA-pop BPG with velocities ranging from 38 cm/second        - 164 cm/second without evidence of stenosis.     3. Patent distal native artery.   IMPRESSION:  1. Stable ABIs bilaterally.  2. Patent left DEIA-pop artery BPG.     ___________________________________________  P. Liliane Bade, M.D.   PB/MEDQ  D:  08/07/2007  T:  08/07/2007  Job:  [id number]

## 2010-06-13 NOTE — Op Note (Signed)
NAMEBRAYA, HABERMEHL             ACCOUNT NO.:  [id number]   MEDICAL RECORD NO.:  [id number]          PATIENT TYPE:  INP   LOCATION:  3316                         FACILITY:  [facility]   PHYSICIAN:  Quita Skye. Hart Rochester, M.D.  DATE OF BIRTH:  11-28-1940   DATE OF PROCEDURE:  09/03/2008  DATE OF DISCHARGE:                               OPERATIVE REPORT   PREOPERATIVE DIAGNOSES:  Left iliac occlusion with severe claudication,  left leg and threatened left femoral-popliteal bypass graft.   POSTOPERATIVE DIAGNOSES:  Left iliac occlusion with severe claudication,  left leg and threatened left femoral-popliteal bypass graft.   OPERATION:  Right-to-left femoral-femoral bypass using an 8-mm  Hemashield Dacron graft.   SURGEON:  Quita Skye. Hart Rochester, MD   FIRST ASSISTANT:  Della Goo, PA-C   ANESTHESIA:  General endotracheal.   DESCRIPTION OF PROCEDURE:  The patient was taken to the operating room  and placed in the supine position at which time satisfactory general  endotracheal anesthesia was administered.  Both groins and the left  lower extremity were prepped with Betadine scrub and solution and draped  in routine sterile manner.  Longitudinal incisions were made in both  inguinal areas and carried down through subcutaneous tissue.  On the  right side, there was a 3+ pulse in the femoral artery, and it was  exposed up beneath the inguinal ligament and down to the origin of  superficial femoral and profunda.  The profunda was patent, superficial  femoral was occluded.  On the left side, external iliac was exposed  beneath the inguinal ligament where it was pulseless.  There was a  previous patch angioplasty and a vein graft originated from that and  this did not have a palpable pulse, but was patent on the angiogram.  Suprapubic subcutaneous tunnel was created, and the patient was  heparinized.  Beginning on the right side, the vessels were occluded  with vascular clamps, longitudinal  opening made in the common femoral,  and 15 blade extended with Potts scissors down to the terminal common  femoral artery.  The vessel was thickened, but had excellent inflow.  The 8-mm Hemashield Dacron graft, which had been tunnel, was spatulated  and anastomosed end-to-side with 5-0 Prolene.  Following this, attention  turned to the left side where a longitudinal opening made as far  proximally as possible with the inguinal ligament being the limiting  factor.  This was actually in the hood of the vein graft anastomosis and  carried down the vein graft about 3 cm.  There was very sluggish inflow  coming from above, and no patent profunda was identified.  There was  excellent backbleeding from the vein graft, and Fogarty catheter was  passed down the vein graft to the ankle level.  Upon return, no thrombus  was retrieved, and there was good backbleeding.  Dacron graft  anastomosed end-to-side to the arteriotomy with 5-0 Prolene.  Clamps  released.  There  was an excellent pulse in both femoral arteries and good Doppler flow.  Protamine was given to reverse the heparin following adequate  hemostasis.  Wounds irrigated with saline,  closed in layers with Vicryl  in subcuticular fashion with Dermabond.  Sterile dressing applied.  The  patient was taken to recovery room in satisfactory condition.      Quita Skye Hart Rochester, M.D.  Electronically Signed     JDL/MEDQ  D:  09/03/2008  T:  09/03/2008  Job:  [id number]

## 2010-06-13 NOTE — Procedures (Signed)
BYPASS GRAFT EVALUATION   INDICATION:  Followup, left lower extremity bypass graft.  Patient  states that claudication has become somewhat worse.   HISTORY:  Diabetes:  Yes.  Cardiac:  CAD, A fib, CHF.  Hypertension:  Yes.  Smoking:  Quit.  Previous Surgery:  Repair of vein graft stenosis, left EIA to popliteal  artery bypass graft with reverse saphenous vein on 12/15/2002 by Dr.  Madilyn Fireman.   SINGLE LEVEL ARTERIAL EXAM                               RIGHT              LEFT  Brachial:                    149                142  Anterior tibial:             55                 97  Posterior tibial:            54                 107  Peroneal:  Ankle/brachial index:        0.37               0.72   PREVIOUS ABI:  Date: 07/19/2006  RIGHT:  0.30  LEFT:  0.69   LOWER EXTREMITY BYPASS GRAFT DUPLEX EXAM:   DUPLEX:  1. Doppler arterial waveforms appear monophasic proximal to, within,      and distal to bypass graft.  2. Elevated velocities > 200 cm/s near proximal anastomosis appears      stable.   IMPRESSION:  1. Patent left external iliac artery to popliteal artery bypass graft.  2. Bilateral ankle brachial indices appear stable from previous study.   ___________________________________________  P. Liliane Bade, M.D.   AS/MEDQ  D:  02/06/2007  T:  02/07/2007  Job:  [id number]

## 2010-06-13 NOTE — Procedures (Signed)
BYPASS GRAFT EVALUATION   INDICATION:  Followup evaluation of lower extremity bypass graft.   HISTORY:  Diabetes:  Yes.  Cardiac:  Atrial fibrillation.  Hypertension:  Yes.  Smoking:  No.  Previous Surgery:  Repair of vein graft stenosis on 12/15/2002.  Left  distal external iliac artery to popliteal artery bypass graft with  reverse saphenous vein on 12/05/2002 by Dr. Madilyn Fireman.   SINGLE LEVEL ARTERIAL EXAM                               RIGHT              LEFT  Brachial:                    140                133  Anterior tibial:             38                 53  Posterior tibial:            57                 72  Peroneal:  Ankle/brachial index:        0.41               0.51   PREVIOUS ABI:  Date:  08/07/2007  RIGHT:  0.40  LEFT:  0.74   LOWER EXTREMITY BYPASS GRAFT DUPLEX EXAM:   DUPLEX:  Doppler arterial waveforms are monophasic proximal to, within  and distal to the bypass graft with an elevated peak systolic velocity  of 373 cm/sec in the mid left external iliac artery.  Distal anastomosis  of the bypass graft could not be visualized.   IMPRESSION:  1. Right ABI stable compared to previous study.  2. Left ABI has decreased since previous study.  3. Patent left external iliac to popliteal artery bypass graft.  4. Left external iliac artery stenosis.   ___________________________________________  P. Liliane Bade, M.D.   MC/MEDQ  D:  02/12/2008  T:  02/13/2008  Job:  [id number]

## 2010-06-13 NOTE — Op Note (Signed)
NAMEMONQUIE, FULGHAM             ACCOUNT NO.:  [id number]   MEDICAL RECORD NO.:  [id number]          PATIENT TYPE:  AMB   LOCATION:  SDS                          FACILITY:  [facility]   PHYSICIAN:  Balinda Quails, M.D.    DATE OF BIRTH:  1940-04-27   DATE OF PROCEDURE:  09/01/2008  DATE OF DISCHARGE:  09/01/2008                               OPERATIVE REPORT   SURGEON:  Balinda Quails, MD   DIAGNOSIS:  Left iliac occlusion.   PROCEDURE:  Abdominal aortogram with bilateral lower extremity runoff  arteriography.   ACCESS:  Right common femoral artery 5-French sheath.   CONTRAST:  120 mL of Visipaque.   COMPLICATIONS:  None apparent.   CLINICAL NOTE:  Casey [name] is a 70 year old female who initially  underwent left femoral-popliteal bypass in 2004 with subsequent repair  of vein graft stenosis.  She had a left distal external iliac stent  placed in January 2010.  She presented for protocol followup, this  reveals probable high-grade stenosis or possible occlusion of the left  external iliac stent placed in the left femoral-popliteal bypass at  risk.  This was limb salvage bypass graft.   PROCEDURE NOTE:  The patient was brought to the cath lab in stable  condition.  Placed in the supine position.  Right groin was prepped and  draped in sterile fashion.   Skin and subcutaneous tissue of the right groin were instilled with 1%  Xylocaine.  An 18-gauge needle was introduced in the right common  femoral artery.  A 0.035 Bentson guidewire was advanced through the  needle into the midabdominal aorta.  The 5-French sheath advanced over  the guidewire, the dilator removed, sheath flushed with heparin and  saline solution.   A crossover IMA catheter was then advanced over guidewire.  This was  engaged in the left common iliac artery origin.  An attempted left iliac  arteriogram was obtained.  This, however, revealed the left common iliac  artery to be occluded just beyond its origin.   There was a short cuff of  the vessel.   The guidewire was then reinserted and a pigtail catheter was then  advanced over the guidewire and placed in the infrarenal aorta.  An AP  pelvic arteriogram was obtained.  This revealed the infrarenal aorta to  be widely patent.  The right common iliac artery was intact as were the  right hypogastric and right external iliac arteries.  The left common  iliac artery was occluded just beyond its origin.  Collaterals  reconstituted the left common femoral artery at the head of the femur.  There were extensive pelvic collaterals evident via lumbar vessels and  the middle sacral vessel.   The lower extremity runoff arteriogram was obtained with injection of  contrast through the pigtail catheter.  This revealed the distal left  common femoral artery to reconstitute.  The left profunda femoris artery  was intact.  The left superficial femoral artery occluded.  The left  femoral-popliteal bypass graft was patent.  This revealed an anastomosis  to the distal left popliteal artery.  There was  tibial disease present  with small tibial vessels, however, all tibial vessels were patent in  the left leg.  The right leg revealed the common femoral and profunda  femoris to be intact.  The right superficial femoral artery was occluded  at its origin.  There was no significant reconstitution of the right  popliteal artery.  Tibial vessels were noted in the right leg, small in  caliber, appeared to be all intact via collaterals.   This completed the arteriogram procedure.  The guidewire reinserted and  pigtail catheter removed.  Right femoral sheath removed without apparent  complication.   FINAL IMPRESSION:  Left common iliac artery occlusion, left external  iliac artery occlusion with reconstitution of the distal left popliteal  artery and patent left femoral-popliteal bypass via collateral flow.   DISPOSITION:  These results have been reviewed with the  patient further.  She will require a right-to-left femoral-femoral bypass for maintenance  of at-risk left femoral-popliteal graft.  This will be scheduled fairly  urgently.      Balinda Quails, M.D.  Electronically Signed     PGH/MEDQ  D:  09/26/2008  T:  09/26/2008  Job:  [id number]

## 2010-06-13 NOTE — Assessment & Plan Note (Signed)
OFFICE VISIT   Casey [name]  DOB:  1940-06-14                                       09/06/2009  HYQMV#:[id number]   The patient returns today for continued followup regarding lower  extremity occlusive disease.  She had previously undergone left femoral-  popliteal bypass grafting by Dr. Madilyn Fireman and right to left femoral-femoral  bypass grafting by me in August of 2010.  She has mild symptoms of  claudication in the right foot and lower calf after walking about 1  mile.  She does not have any rest pain or history of nonhealing ulcers,  infection or numbness in the feet.  She has no symptoms in the left leg  at the present time.  She denies any neurologic symptoms such as  hemiparesis, aphasia, amaurosis fugax, diplopia, blurred vision or  syncope.  She does have some orthopnea, occasional chest discomfort,  history of [name] stroke and atrial fibrillation as well as some decreased  visual acuity.   CHRONIC MEDICAL PROBLEMS:  1. Diabetes type 1.  2. Hypertension.  3. Hyperlipidemia.  4. History of atrial fibrillation.  5. Chronic renal insufficiency.  6. Anemia.   SOCIAL HISTORY:  She is single, has two children, is retired.  Does not  use tobacco or alcohol.   REVIEW OF SYSTEMS:  As noted above.  All other systems are negative.   PHYSICAL EXAM:  Vital signs:  Blood pressure 163/73, heart rate 66,  respirations 20.  General:  [name] well-developed, well-nourished female in  no apparent distress, alert and oriented times 3.  HEENT:  Exam is  normal for age.  EOMs intact.  Lungs:  Clear to auscultation.  No  rhonchi or wheezing.  Cardiovascular:  Regular rhythm.  No murmurs.  Carotid pulses 3+.  No audible bruits.  Abdomen:  Soft, nontender with  no masses.  Lower extremities:  Exam reveals 3+ femoral pulses  bilaterally as well as the femoral-femoral bypass.  Left popliteal was  2+ on palpation.  There are no distal pulses palpable in either foot.  Both  feet are well-perfused.   Today I ordered lower extremity arterial Doppler studies which I  reviewed and interpreted.  She has biphasic flow in the left foot with  normal ABI and her right leg is 0.54 consistent with right superficial  femoral occlusion.  I think her symptoms are quite stable.  We will  continue to follow her on an annual basis in the vascular lab in 1 year  unless she has any progressive symptoms.     Casey [name], M.D.  Electronically Signed   JDL/MEDQ  D:  09/06/2009  T:  09/07/2009  Job:  [id number]

## 2010-06-13 NOTE — Assessment & Plan Note (Signed)
OFFICE VISIT   Casey [name], Casey [name]  DOB:  [birth year]                                       02/12/2008  [id number]#:[id number]   The patient underwent [name] left femoral popliteal bypass on 06/03/2002 for  limb salvage and subsequent revision of the bypass with Dacron patch  angioplasty for proximal stenosis on 12/14/2002.   She has continued to follow up per protocol.  Has noted some discomfort  in the left lower extremity.  Left ABI is reduced from 0.74 to 0.51.   The left femoral popliteal vein graft remains patent, however, there is  evidence of [name] high-grade stenosis in the left external iliac artery with  velocity noted at 373 cm/sec.   With these findings I do think we should go ahead with an arteriogram.   Blood pressure is 136/80, pulse 84 per minute.   She will stop Coumadin on 02/15/2008, procedure will be carried out  02/18/2008 with potential intervention for vein graft stenosis left  femoral popliteal bypass.   Balinda Quails, M.D.  Electronically Signed   PGH/MEDQ  D:  02/12/2008  T:  02/13/2008  Job:  [id number]

## 2010-06-13 NOTE — Procedures (Signed)
BYPASS GRAFT EVALUATION   INDICATION:  Followup left lower extremity revascularization, stable  claudication from preop.   HISTORY:  Diabetes:  Yes.  Cardiac:  Atrial fibrillation.  Hypertension:  Yes.  Smoking:  No.  Previous Surgery:  Left external iliac artery PTA and stent 02/18/2008.  Repair of vein graft stenosis 12/15/2002.  Left distal EIA to popliteal  artery bypass graft 12/05/2002 by Dr. Madilyn Fireman.   SINGLE LEVEL ARTERIAL EXAM                               RIGHT              LEFT  Brachial:                    154                148  Anterior tibial:             62                 49  Posterior tibial:            54                 63  Peroneal:  Ankle/brachial index:        0.40               0.41   PREVIOUS ABI:  Date:  02/12/2008  RIGHT:  0.41  LEFT:  0.51   LOWER EXTREMITY BYPASS GRAFT DUPLEX EXAM:   DUPLEX:  Left external iliac artery stent flow not visualized,  suggestive of possible occlusion.  Left distal external iliac artery to popliteal artery bypass graft  appears patent, however, low flow and depth of graft made some areas  technically difficult to visualize.   IMPRESSION:  1. Right ABI appears stable.  2. Left ABI shows decrease from preop study.  3. Possible left external iliac artery stent occlusion.  4. Patent left distal external iliac artery to popliteal artery bypass      graft where visualized with low flow noted.  5. Technically difficult study due to non-n.p.o. as well as low flow      and depth of graft.        ___________________________________________  P. Liliane Bade, M.D.   AS/MEDQ  D:  08/26/2008  T:  08/26/2008  Job:  [id number]

## 2010-06-13 NOTE — Op Note (Signed)
Casey [name], Casey [name]             ACCOUNT NO.:  [id number]   MEDICAL RECORD NO.:  [id number]          PATIENT TYPE:  AMB   LOCATION:  SDS                          FACILITY:  [facility]   PHYSICIAN:  Balinda Quails, M.D.    DATE OF BIRTH:  [birth year]   DATE OF PROCEDURE:  02/18/2008  DATE OF DISCHARGE:                               OPERATIVE REPORT   DIAGNOSIS:  Stenosis, left femoral-popliteal bypass.   PROCEDURES:  1. Left lower extremity arteriogram.  2. Left external iliac PTA/stent.   COMPLICATIONS:  None apparent.   ACCESS:  Right common femoral artery with 6-French sheath.   CONTRAST:  Visipaque 100 mL.   CLINICAL NOTE:  [name] is a 70 year old female with a history  of left femoral-popliteal bypass for limb salvage.  Recent surveillance  revealed evidence of a stenosis in the left external iliac artery  proximal to the left femoral-popliteal graft.  Brought to the cath lab  at this time for a diagnostic workup and intervention for at-risk left  femoral-popliteal bypass.   PROCEDURE NOTE:  The patient brought to the cath lab in stable  condition.  Placed in supine position.  Right groin prepped and draped  in the sterile fashion.  Skin and subcutaneous tissues were instilled  with 1% Xylocaine.  An 18-gauge needle introduced to right common  femoral artery.  A 0.035 Bentson guidewire advanced through the needle  into the mid abdominal aorta.  A 5-French sheath advanced over the  guidewire.  A crossover catheter advanced over the guidewire and engaged  in the left common iliac artery.   Left iliac arteriogram was obtained.  Left common iliac artery was  widely patent.  The left hypogastric artery revealed a high-grade  stenosis at its origin.  Multiple areas of stenosis were noted in the  left external iliac artery.  These appeared to be web-like stenoses.   The left lower extremity arteriogram revealed a patent left femoral-  popliteal graft and distal  anastomosis was widely patent.  Runoff via  the left posterior tibial and peroneal arteries.   A Wholey guidewire was then passed through the catheter and into the  left external iliac artery and down into the left femoral-popliteal vein  graft.  The right femoral sheath removed and a 6-French Terumo sheath  advanced over the guidewire into the distal left common iliac artery.  The dilator removed and sheath flushed with heparin saline solution.  The patient administered 5000 units of heparin intravenously.  ACT 186  seconds.   Predilatation of the stenotic area and left external iliac artery  carried out with a 5 x 4  Synergy balloon, 3 inflations to 8  atmospheres.  The balloon then removed and a 7 x 40 Smart stent placed  in the left external iliac artery at the area of major disease.  This  was then postdilated with a 6 x 40 Synergy balloon at 6 atmospheres for  2 inflations.   Completion arteriogram revealed an excellent technical result without  evidence of complication.   Guidewire removed and the sheath drawn back into the right  external  iliac artery.   FINAL IMPRESSION:  1. Stenosis, left external iliac artery proximal to left femoral-      popliteal bypass.  2. Patent left femoral-popliteal bypass.  3. Successful angioplasty and stenting left external iliac artery      without residual stenosis.      Balinda Quails, M.D.  Electronically Signed     PGH/MEDQ  D:  02/18/2008  T:  02/19/2008  Job:  [id number]

## 2010-06-13 NOTE — Procedures (Signed)
BYPASS GRAFT EVALUATION   INDICATION:  Right to left fem-fem, left external iliac artery to  popliteal bypass grafts.   HISTORY:  Diabetes:  Yes.  Cardiac:  Atrial fibrillation.  Hypertension:  Yes.  Smoking:  No.  Previous Surgery:  Left external iliac artery stent 09/17/2008, left  external iliac artery to pop bypass graft 12/05/2002, right to left fem-  fem bypass graft 09/03/2008.   SINGLE LEVEL ARTERIAL EXAM                               RIGHT              LEFT  Brachial:                    137                128  Anterior tibial:             37                 61  Posterior tibial:            17                 88  Peroneal:  Ankle/brachial index:        0.27               0.64   PREVIOUS ABI:  Date:  09/04/2008  RIGHT:  0.33  LEFT:  0.82   LOWER EXTREMITY BYPASS GRAFT DUPLEX EXAM:   DUPLEX:  Monophasic Doppler waveforms throughout fem-fem and left iliac  to popliteal bypass grafts.   IMPRESSION:     1. Patent bypass graft with diminished flow.     2. Left ankle brachial index suggests moderate disease.     3. The right ankle brachial index suggests critical disease.        ___________________________________________  Janetta Hora. Fields, MD   CJ/MEDQ  D:  03/16/2009  T:  03/16/2009  Job:  [id number]

## 2010-06-16 NOTE — H&P (Signed)
NAME:  Casey [name], Casey [name]                       ACCOUNT NO.:  [id number]   MEDICAL RECORD NO.:  [id number]                   PATIENT TYPE:  INP   LOCATION:  1832                                 FACILITY:  [facility]   PHYSICIAN:  Peter M. [place]il[place], M.D.               DATE OF BIRTH:  1940/02/21   DATE OF ADMISSION:  01/04/2002  DATE OF DISCHARGE:                                HISTORY & PHYSICAL   HISTORY OF PRESENT ILLNESS:  The patient is a 70 year old black female with  history of congestive heart failure.  She is status post CVA and also has  history of chronic atrial fibrillation.  She presents today with a two day  history of worsening shortness of breath associated with minimal cough.  She  has had some increased pedal edema and also notes significant orthopnea.  Today, her shortness of breath got acutely worse.  She presented to the  emergency room.  She also complained of some mid substernal chest pain.  She  denies any recent increased sodium intake.  She reports that she has been  taking her medications.   PAST MEDICAL HISTORY:  1. 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  2. Chronic atrial fibrillation.  3. Hypertension.  4. Hyperlipidemia.  5. Congestive heart failure, ejection fraction of 20% by echo in September     2003.  6. 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  7. Status post tubal ligation.  8. History of medical noncompliance.   ALLERGIES:  PENICILLIN.   CURRENT MEDICATIONS:  1. Foltx once daily.  2. Zocor 20 mg q. h.s.  3. Coumadin 6 mg q. h.s.  4. Combivent metered dose inhaler p.r.n.  5. Lasix 20 mg daily.  6. Avapro 150 mg daily.  7. Prilosec 20 mg daily.  8. Humibid LA b.i.d.  9. Hydrocodone/APAP p.r.n.  10.      Oxybutynin 10 mg in the p.m.  11.      Remeron 15 mg q. h.s. p.r.n.   SOCIAL HISTORY:  The patient lives with her daughter.   She is retired from  [employer]  She denies alcohol use.  She has been a long-term one pack per day smoker  but denies current smoking.   FAMILY HISTORY:  Her father died at age 34 of a CVA.  Mother died at age 47  of unknown causes.  Sister died with lupus.   REVIEW OF SYSTEMS:  As per HPI.  Otherwise, noncontributory.   PHYSICAL EXAMINATION:  GENERAL:  The patient is a well-developed black  female in acute respiratory distress.  VITAL SIGNS:  Respiratory rate is 36, blood pressure is 141/76, pulse is 110  and irregular, temperature is 97.1, saturations 91% on 2 L nasal cannula.  HEENT:  Unremarkable.  NECK:  The patient  has JVD to the angle of the jaw.  She has no bruits or  thyromegaly.  LUNGS:  Diffuse expiratory wheezes with bilateral rales one-half way up.  CARDIAC:  Tachycardic irregular rhythm without murmur.  She has a prominent  apical lift.  ABDOMEN:  Soft, nontender.  EXTREMITIES:  Trace edema in the pedal area.  NEUROLOGIC:  Right hemiparesis.   LABORATORY DATA:  ECG shows atrial fibrillation, rapid ventricular response,  nonspecific  ST-T wave changes.  No acute changes noted in comparison with  prior ECG.   Chest x-ray shows cardiomegaly with early pulmonary edema.   IMPRESSION:  1. Acute congestive heart failure.  2. History of chronic obstructive pulmonary disease.  3. History of dilated cardiomyopathy.  4. Status post cerebrovascular accident with right hemiparesis.  5. Chronic atrial fibrillation.  6. Chronic anticoagulation.   PLAN:  The patient will be admitted to PCU.  She will be treated with IV  Lasix and neubulizer therapy.  Rule out for myocardial infarction.  Will  obtain routine lab work including cardiac enzymes and BNP level.                                               Peter M. [place]iland, M.D.    PMJ/MEDQ  D:  01/04/2002  T:  01/04/2002  Job:  [id number]   cc:   Cassell Clement, M.D.  [address], [address]  [place]  [place]  [address]  Fax: [phone]

## 2010-06-16 NOTE — H&P (Signed)
NAME:  Casey [name], Casey [name]                       ACCOUNT NO.:  [id number]   MEDICAL RECORD NO.:  [id number]                   PATIENT TYPE:  INP   LOCATION:  5522                                 FACILITY:  [facility]   PHYSICIAN:  Balinda Quails, M.D.                 DATE OF BIRTH:  April 30, 1940   DATE OF ADMISSION:  12/09/2002  DATE OF DISCHARGE:                                HISTORY & PHYSICAL   PRIMARY CARE PHYSICIAN:  Karlene Einstein, M.D.   CARDIOLOGIST:  Cassell Clement, M.D.   HISTORY OF PRESENT ILLNESS:  This 70 year old African-American female  returned to the CVTS office and Dr. Madilyn Fireman on November 30, 2002, for follow of  her left femoral-popliteal bypass and left lower extremity peripheral  vascular occlusive disease. Doppler evaluation that day revealed ABI on the  right of 0.45 and on the left 0.87.  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  Dr. Madilyn Fireman felt this most likely will  threaten her graft patency and recommended arteriogram for further  evaluation and possible percutaneous intervention.  The procedure, risks,  and benefits were all discussed with Casey [name], and she agreed with this  plan. The plan is for discontinuation of her Coumadin prior to admission,  and she will be admitted to the hospital two days prior to her procedure for  heparin therapy.   PAST MEDICAL HISTORY:  1. 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  2. Peripheral vascular occlusive disease, status post left femoral-below-the-     knee-popliteal bypass May 2004 by Dr. Madilyn Fireman.  3. Coronary artery disease.  4. Chronic atrial fibrillation on Coumadin therapy.  5. CHF, last admission to hospital September 2004.  Last 2-D echocardiogram     January 2004 revealed ejection fraction 20 to 30% with mild to moderate     tricuspid regurgitation.  6. COPD.  7. Hypertension.  8. Dyslipidemia.  9. Diabetes mellitus, type 2, diagnosed April 2004.  She checks CBGs twice     daily at home.  They usually range 114 to 120.  10.      Chronic renal insufficiency.  11.      GERD.  12.      History of gout.  13.      History of migraine headaches.  14.      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 She is in     remission.   ALLERGIES:  PENICILLIN.  She says she had remote history of a rash after  taking PENICILLIN.   MEDICATIONS PRIOR TO ADMISSION:  1. Coumadin 6 mg daily, last dose November 8.  2. Toprol.  Her dosage has recently been changed to  100 mg a day; however,     she has only been alternating 50 and 100 mg each day due to financial     considerations.  3. Lanoxin 0.125 mg daily.  4. Amaryl 4 mg daily.  5. Colchicine 0.6 mg daily.  6. Ditropan 5 mg q.a.m., 10 mg q.p.m.  7. Zocor 20 mg daily.   SOCIAL HISTORY:  Casey [name] is married.  She has two adult children, a  daughter and a son, six grandchildren, and three great grandchildren.  She  is retired from [employer] after 33 years of employment.  She quit smoking  cigarettes October 2003, does not drink alcohol.  She currently lives with  her daughter in a multi-level dwelling.  She drives, and her daughter, Amie Critchley, will be available for assistance after discharge from the  hospital.   FAMILY HISTORY:  Mother had hypertension and questionable history of  cervical cancer.  She died at age 41.  Her father died at age 59 with CVA.  Siblings include brother who died of head and neck cancer.  Another brother  died in the [place] war.  A sister died of lupus.   REVIEW OF SYSTEMS:  GENERAL:  Casey [name] is feeling pretty well.  She has  had some headaches recently due to elevated blood pressure.  Dr. Kerry Dory  has recently increased her  Toprol dose.  She wears eye glasses and has upper  and lower dentures.  She has had no difficulty chewing or swallowing, no  gross shortness of breath. She does report a dry cough.  She does sleep with  two pillows.  No recent complaints of chest pain.  She does have some mild  left lower extremity swelling.  She has no GI complaints.  Her appetite is  okay.  She follows a regular diet.  GU:  She reports frequency, especially  at night.  She needs to get up every one to two hours.  She does have  urinary incontinence.  No recent dizziness or syncope, no recent falls.  She  ambulates independently, does not use any assistive devices.  No complaints  of claudication.  No recent fevers or infections.  No troubles with  depression or anxiety.   PHYSICAL EXAMINATION:  VITAL SIGNS:  Blood pressure on the right 140/90, on  the left 144/92.  Heart rate approximately 80 beats per minute.  Her height  is 5 feet 5 inches. She keeps her weight between 177 and 180 pounds.  GENERAL:  A 70 year old African-American female in no acute distress.  HEENT:  Eyes: PERRLA.  EOMI.  Oral mucosa is pink and moist.  NECK:  Full range of motion.  No carotid bruits, thyromegaly, or  lymphadenopathy.  LUNGS:  Breathing is  unlabored.  Her breath sounds are clear bilaterally.  HEART:  Irregularly irregular rhythm.  ABDOMEN:  Rotund.  It is soft and nontender.  EXTREMITIES:  She has 2+ femoral pulses bilaterally.  The left femoral pulse  has a bruit.  She has a 1+ left dorsalis pedis pulse.  No pedal pulses are  palpable on the right.  She does have mild left lower extremity edema.  NEUROLOGIC:  Alert and oriented.  Cranial nerves II-XII grossly intact.  Her  gait is steady.  She has a flaccid right upper extremity.  Remaining  extremities have full range of motion.  Her muscle strength is 4/5 in left  upper extremity as well as bilateral lower extremities.  LABORATORY DATA:  Studies are pending and will include CBC,  BMET, PT, INR,  and PTT.    IMPRESSION:  This is a 70 year old female with threatened left femoral-  popliteal bypass due to proximal disease.  Plan is for elective admission to  [facility] and Dr. Madilyn Fireman on November 10 for conversion from  Coumadin to heparin anticoagulation therapy.  She will have a left lower  extremity arteriogram and percutaneous intervention on December 11, 2002.  On admission, we will continue her home medication regimen.      Toribio Harbour, N.P.                  Balinda Quails, M.D.    CTK/MEDQ  D:  12/09/2002  T:  12/09/2002  Job:  [id number]   cc:   Karlene Einstein, M.D.  Internal Medicine Resident - [place]  [place] [address]  Fax: [phone]   Cassell Clement, M.D.  [address], [address]  [place]  [place] [address]  Fax: [phone]

## 2010-06-16 NOTE — H&P (Signed)
NAME:  Casey [name], WHETSEL                         ACCOUNT NO.:  [id number]   MEDICAL RECORD NO.:  [id number]                   PATIENT TYPE:   LOCATION:                                       FACILITY:   PHYSICIAN:  Thomas A. Patty Sermons, M.D.           DATE OF BIRTH:   DATE OF ADMISSION:  10/19/2001  DATE OF DISCHARGE:                                HISTORY & PHYSICAL   HISTORY OF PRESENT ILLNESS:  This is a 70 year old black female admitted  with left-sided headache.  She complains of headache behind her left eye.  A  CT scan of the brain in the emergency room showed a subacute right occipital  infarct which is nonhemorrhagic.  The patient has a history of hypertension,  chronic atrial fibrillation, and prior congestive heart failure.  She has  been on long-term Coumadin but ran out about a week ago and did not have the  money to purchase additional medication.  The patient complains of dyspnea  but has not had chest pain.  She has felt extremely fatigued recently.   CURRENT MEDICATIONS:  1. Diovan 160 mg q.d.  2. Furosemide 40 mg q.d.  3. Coumadin 5 mg q.d.  4. Cardizem 240 mg q.d.  5. Lanoxin 0.25 mg q.d.  6. Potassium 2 mg b.i.d.  7. Darvocet p.r.n.  8. However, she has run out of Lasix, Coumadin, Cardizem, and potassium.   SOCIAL HISTORY:  Reveals that she smokes occasional cigarettes.  She drinks  an occasional beer.  She is single.  She lives with her grandson who is 71.  She is retired from [employer].   ALLERGIES:  Allergic to PENICILLIN.   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  She is being followed by Dr.  Quintin Alto C. Shinn.  She has a history of tubal ligation.  She had a history of  ejection fraction of 37% documented in 1998.   FAMILY HISTORY:  Revealed that her father died at age 41 of a stroke.  Mother  died at 48 of old age.  She had a sister who died of lupus.   SOCIAL HISTORY:  She is retired medically from [employer].   REVIEW OF SYSTEMS:  She had no hematochezia or melena.  She does have  dysuria and nocturia and urinary frequency.  She has had frequent headaches.  The remainder of the review of systems is negative.   PHYSICAL EXAMINATION:  VITAL SIGNS:  Blood pressure is 177/91, pulse is 95  and irregularly irregular.  Respirations are 22.  HEENT:  The pupils are equal and reactive.  Extraocular movements are full.  Sclerae are clear.  Mouth and pharynx are normal.  NECK:  Jugular venous pressure is normal.  Carotid is normal.  Thyroid is  normal.  There is no lymphadenopathy.  CHEST:  Clear.  HEART:  Reveals normal first and second heart sounds without murmurs, rubs,  or gallops.  ABDOMEN:  Soft and nontender.  EXTREMITIES:  There are 1+ pedal pulses.  No edema or phlebitis.   LABORATORY DATA:  Electrocardiogram shows atrial fibrillation with left  ventricular hypertrophy, with mild strain, no acute change.  Chest x-ray  shows old scarring right lung.  There is no CHF but there is cardiomegaly.  CT brain scan shows subacute infarct in the right occipital area.   Labs include pro time 13.1, INR 0.9, PTT 28.  Potassium 2.9, BUN 11,  creatinine 0.9, blood sugar 125.  Digoxin level 0.1.  Urinalysis with  positive nitrites.  CK-MB 2.3, troponin I negative.  White count 7900,  hemoglobin 16.5.  B-type natriuretic peptide 307.   IMPRESSION:  1. Probable subacute cerebral infarct, most likely secondary to embolism     from the left atrium.  2. Chronic headaches which in this case are probably not related to the     embolism.  3. Chronic atrial fibrillation.  4. Chronic Coumadin anticoagulation which is subtherapeutic secondary to her     having run out of medication.  5. Chronic hypertension.  6. Complicated congestive heart failure.  7. Hypokalemia.  8. Past history of  chronic lymphocytic leukemia of the larynx treated by Dr.     Quintin Alto C. Shinn with chemotherapy and radiation with no evidence of     recurrence.  9. Probable urinary tract infection.   DISPOSITION:  We are going to admit her.  We are going to treat with IV  heparin until pro time is therapeutic.  We will replete her potassium.  We  will get a urine culture.  We will get a follow-up MRI as recommended by  radiology.  We will also get 2-D echocardiogram and carotid dopplers.                                               Thomas A. Patty Sermons, M.D.    TAB/MEDQ  D:  10/19/2001  T:  10/19/2001  Job:  [id number]

## 2010-06-16 NOTE — Discharge Summary (Signed)
NAME:  Casey [name], GUNNOE                       ACCOUNT NO.:  [id number]   MEDICAL RECORD NO.:  [id number]                   PATIENT TYPE:  INP   LOCATION:  2006                                 FACILITY:  [facility]   PHYSICIAN:  Lonia Blood, M.D.            DATE OF BIRTH:  1940/02/11   DATE OF ADMISSION:  10/05/2002  DATE OF DISCHARGE:  10/14/2002                                 DISCHARGE SUMMARY   DISCHARGE DIAGNOSES:  1. Acute congestive heart failure exacerbation secondary to uncontrolled     atrial fibrillation.     a. Known significant coronary artery disease.     b. Echocardiogram  this admission revealing ejection fraction 25 to 35%        with severe diffuse left ventricular hypokinesis.     c. Significant titration of medications required during this        hospitalization.     d. Initiation of digoxin therapy.  2. Significant peripheral vascular disease with left femoral to popliteal     bypass graft May 2004.  3. Left parietal embolic cerebrovascular accident September 2003 with     residual right upper extremity hemiparesis and unstable gait.  4. Chronic anemia.  5. Hyperlipidemia.  6. Chronic obstructive pulmonary disease.  7. Diabetes mellitus type 2.  8. Hypertension.  9. Chronic renal failure.  10.      Chronic atrial fibrillation.  11.      History of chronic lymphocytic leukemia.  12.      Gastroesophageal reflux disease.  13.      Urinary incontinence.  14.      Gout.  15.      Depression.  16.      Status post bilateral tubal ligation.   DISCHARGE MEDICATIONS:  1. Toprol XL 25 mg daily.  2. Amaryl 4 mg daily.  3. Colchicine 0.6 mg daily.  4. Ditropan 5 mg a.m. and 10 mg q.p.m.  5. Zocor 20 mg daily.  6. Lanoxin 0.125 mg daily.  7. Coumadin 6 mg q. evening at six.   PROCEDURE:  1. Transthoracic echocardiogram October 08, 2002, - LV systolic function     markedly decreased.  Ejection fraction 25 to 35%.  Severe diffuse LV     hypokinesis.  No  other significant findings.  2. CT scan of the chest October 12, 2002, - radiographic abnormality     noticed on the x-ray corresponds with a 7 mm right middle lobe nodule.     Borderline mediastinal adenopathy.  No significant change when compared     to prior study.  3. CT scan of the head October 10, 2002, - encephalomalacia left cerebral     hemisphere secondary to remote infarct.  No acute hemorrhage or edema.   CONSULTATIONS:  Cassell Clement, M.D., cardiology.   FOLLOW UP:  The patient will follow up in Dr. Yevonne Pax office the Monday  following discharge for recheck of  her INR and to have a digoxin level  obtained.  She will follow up with Dr. Patty Sermons on October 22, 2002, at  3:30 for a full office visit.   HISTORY OF PRESENT ILLNESS:  Casey [name] is a very pleasant 70 year old  female who presented to the hospital on the day of admission with complaints  of severe shortness of breath and sensation of palpitations.  On evaluation  in the emergency room, she was found to be in atrial fibrillation with a  rapid response at approximately 110 beats per minute.  Decision was made  that the patient would be admitted to the hospital to allow for better  control of her atrial fibrillation and further investigate her complaints of  shortness of breath.   HOSPITAL COURSE:  PROBLEM #1 -  ACUTE CONGESTIVE HEART FAILURE EXACERBATION  SECONDARY TO ATRIAL FIBRILLATION:  [name] is known to have significant  coronary artery disease as well as peripheral vascular disease.  She has a  known history of congestive heart failure.  Though the patient's ventricular  rate was never in the 180 to 200 range, it was felt that her ischemic  cardiomyopathy was being greatly exacerbated by ventricular rates up into  the 120s.  Titration of medications was carried out during this  hospitalization to include addition of calcium channel blocker and  continuation of beta-blocker.  Initially,  the patient's heart rate was well  controlled.  Unfortunately, with a combination of these negative  chronotropes, the patient began to experience significant hypotension and  bradycardia.  As a result, decision was made to consult cardiology.  With  confirmation of severe systolic dysfunction on the patient's echocardiogram,  decision was made to initiate digoxin therapy.  Calcium channel blocker was  discontinued.  The beta-blocker was continued.  Ultimately, the patient's  hypotension resolved and her atrial fibrillation became well controlled.  At  the time of discharge, the patient was stable with well compensated  congestive heart failure.  Her atrial fibrillation was well controlled on  the above listed medical regimen.  She will follow up with Dr. Patty Sermons in  the outpatient setting to reassess her tolerance of this regimen.   PROBLEM #2 -  ATRIAL FIBRILLATION:  The patient has a history of chronic  atrial fibrillation for which she has previously been on a number of  negative chronotropes as well as Coumadin.  During this hospitalization,  atrial fibrillation was contributing significantly to an acute exacerbation  of the patient's end-stage diastolic congestive heart failure.  Medications  were titrated as noted above until such time that the patient's ventricular  response rate became well controlled.  Coumadin was continued.  The patient  will follow up with Dr. Patty Sermons for reassessment of her atrial  fibrillation.  At the time of discharge, ventricular response rate is well  controlled and Coumadin is therapeutic.   PROBLEM #3 -  DIABETES:  The patient's diabetes is well controlled.  No  change in her baseline regimen was required during this hospitalization.   PROBLEM #4 -  HYPERTENSION:  The patient has a longstanding history of  hypertension.  During this hospitalization, the patient actually became hypotensive because of aggressive therapy for her atrial fibrillation.   With  appropriate titration of her medications, her blood pressure did, in fact,  return to normal.  At the time of discharge, blood pressure is stable and is  neither too low or too high.   PROBLEM #5 -  CHRONIC OBSTRUCTIVE PULMONARY  DISEASE:  The patient's COPD was  stable during this hospitalization.   PROBLEM #6 -  QUESTIONABLE PULMONARY NODULE WITH HISTORY OF CHRONIC  LYMPHOCYTIC LEUKEMIA:  The patient has a history of CLL and some chronic  mediastinal lymphadenopathy associated with such.  Chest x-ray during this  hospitalization revealed bilateral hilar prominence.  Mention was also made  of her right mid lung nodule.  After discussing the situation with the  patient, decision was made to pursue a CT scan of the chest to further  assess these findings.  Nodule was, in fact, confirmed by CT scan  but did  not have typical appearance of a malignancy.  It is noted that the  possibility of metastatic deposit cannot be ruled out.  The patient  adenopathy appears to be stable in comparison to her prior examinations.  After discussion with the patient, the decision was made that this would  simply be followed and a repeat CT scan of the chest could be accomplished  in approximately three months.                                                Lonia Blood, M.D.    JTM/MEDQ  D:  12/02/2002  T:  12/03/2002  Job:  [id number]   cc:   Cassell Clement, M.D.  [address]  [place]  [place] [address]  Fax: [phone]   Lenon Curt. Chilton Si, M.D.  [address]  [place]  [place] [address]  Fax: [phone]

## 2010-06-16 NOTE — Op Note (Signed)
NAME:  Casey [name], Casey [name]                       ACCOUNT NO.:  [id number]   MEDICAL RECORD NO.:  [id number]                   PATIENT TYPE:  INP   LOCATION:  3705                                 FACILITY:  [facility]   PHYSICIAN:  Balinda Quails, M.D.                 DATE OF BIRTH:  July 20, 1940   DATE OF PROCEDURE:  05/28/2002  DATE OF DISCHARGE:                                 OPERATIVE REPORT   DIAGNOSIS:  Bilateral lower extremity peripheral vascular disease.   PROCEDURE:  Abdominal aortogram with bilateral lower extremity runoff,  arteriography.   ACCESS:  Right common femoral artery 5 French sheath.   CONTRAST:  110 mL of Visipaque.   COMPLICATIONS:  None apparent.   CLINICAL NOTE:  The patient is a 70 year old female with a history of a  significant cerebrovascular accident with right hemiparesis.  She is now  able to ambulate.  She has marked limiting claudication.  She is brought to  the catheterization lab at this time for diagnostic arteriography.  She has  a severe reduction of the ankle brachial indices bilaterally.   DESCRIPTION OF PROCEDURE:  The patient was brought to the catheterization  lab in stable condition.  Placed in the supine position.  Both groins  prepped and draped in a sterile fashion.  Administered 2 mg of Nubain, 1 mg  of Versed intravenously.  Also received 10 mg of labetalol intravenously.   Skin and subcutaneous tissue in the right groin instilled with 1% Xylocaine.  A needle was easily introduced in the right common femoral artery.  A 0.025  J-wire passed through the needle into the midabdominal aorta.   A 5 French sheath had been advanced over the guidewire, the dilator removed,  and the sheath flushed with heparin and saline solution.  A pigtail catheter  advanced over the guidewire to the juxtarenal aorta. AP abdominal aortogram  obtained.  This revealed single widely patent bilateral renal arteries.  The  infrarenal aorta revealed no significant  stenosis.  The inferior mesenteric  artery was patent.  The common iliac arteries bilaterally were normal.  The  left internal iliac artery revealed a high-grade stenosis at its origin.  The right internal iliac artery was widely patent.  The right external iliac  artery was normal.  The left external iliac artery provided flow to the  common femoral level.   The pigtail catheter was brought down to the aortic bifurcation and a  bilateral lower extremity runoff arteriography obtained.   The left lower extremity revealed common femoral occlusion.  There was  reconstitution of the profunda femoris artery.  The left superficial femoral  artery was also occluded.  There was reconstitution of a severely-diseased  popliteal artery above the knee.  This then gained more normal caliber below  the knee joint.  In the left calf there was intact three-vessel tibial  artery runoff.   The right lower  extremity revealed the external iliac artery to provide  continuous flow to a patent common femoral artery.  The right common femoral  artery provided flow to the profunda femoris, which revealed a stenosis at  its origin.  The profunda collaterals in the right thigh formed an extensive  geniculates at the knee level.  The right popliteal artery was occluded.  There was reconstitution of the right anterior tibial artery, which provided  dominant flow to the right foot.   This completed the arteriogram procedure.  There were no apparent  complications.  The guidewire reinserted and the pigtail catheter removed.  The right femoral sheath removed.   FINAL IMPRESSION:  1. Widely patent bilateral single renal arteries.  2. Left common femoral artery occlusion.  3. Bilateral superficial femoral artery occlusions.  4. Reconstitution of the left popliteal artery with intact three-vessel     tibial runoff.  5. Right popliteal occlusion with single anterior tibial runoff.   DISPOSITION:  These results will  be reviewed with the patient.  The patient  will require revascularization of the lower extremities for advanced  peripheral vascular disease, and this will be a staged procedure.                                               Balinda Quails, M.D.    PGH/MEDQ  D:  05/28/2002  T:  05/29/2002  Job:  [id number]   cc:   Ellwood Dense, M.D.  [address] [place]  [place] [address]  Fax: [phone]

## 2010-06-16 NOTE — Op Note (Signed)
NAME:  Casey [name], Casey [name]                       ACCOUNT NO.:  [id number]   MEDICAL RECORD NO.:  [id number]                   PATIENT TYPE:  INP   LOCATION:  2036                                 FACILITY:  [facility]   PHYSICIAN:  Balinda Quails, M.D.                 DATE OF BIRTH:  February 04, 1940   DATE OF PROCEDURE:  12/15/2002  DATE OF DISCHARGE:                                 OPERATIVE REPORT   PREOPERATIVE DIAGNOSIS:  Proximal vein graft stenosis, left femoral-  popliteal bypass.   POSTOPERATIVE DIAGNOSIS:  Proximal vein graft stenosis, left femoral-  popliteal bypass.   OPERATION PERFORMED:  Repair of proximal vein graft stenosis, left femoral-  popliteal bypass with Dacron patch angioplasty.   SURGEON:  Balinda Quails, M.D.   ASSISTANT:  Jerold Coombe, P.A.   ANESTHESIA:  General endotracheal.   ANESTHESIOLOGIST:  Sheldon Silvan, M.D.   INDICATIONS FOR PROCEDURE:  Casey [name] is a 70 year old female with a  history of diabetes and advanced peripheral vascular disease.  She has  previously undergone limb salvage left femoral-popliteal bypass.  Surveillance of this graft revealed a proximal stenosis.  Arteriography  verified these findings with a tight stenosis in the proximal portion of the  graft.  She was brought to the operating room at this time for repair of  this stenosis.   DESCRIPTION OF PROCEDURE:  The patient was brought to the operating room in  stable condition.  Placed in supine position.  Left leg prepped and draped  in the sterile fashion.  General endotracheal anesthesia induced.  Oblique  skin incision made through the scar in the left groin  Subcutaneous tissue  divided with electrocautery.  Dissection carried down to expose the proximal  anastomosis.  The inguinal ligament was mobilized and retracted superiorly.  Proximal anastomosis was actually in the retroperitoneum above the level of  the inguinal ligament.  The external iliac artery was freed and  encircled  with a vessel loop proximally. The common femoral artery was freed and  encircled with a vessel loop distally.  The patient was administered 5000  units of heparin intravenously.  Adequate circulation time permitted.  The  vein graft was then opened longitudinally over the hood of the anastomosis.  There was proximal web-like stenosis.  This was resected.   A Dacron patch angioplasty with a Finesse Hemashield patch was carried out  with a running 6-0 Prolene suture.  At the completion of the proximal patch  angioplasty, the graft was flushed.  Clamps were then removed.  Excellent  flow present.  Adequate hemostasis was obtained.  Sponge and instrument  counts were correct.  The patient was administered 50 mg protamine  intravenously.  Subcutaneous tissues then closed with two layers of running  2-0 Vicryl suture.  Staples applied to the skin.  Sterile dressings applied.  The patient tolerated the procedure well.  Transferred to the recovery room  in stable condition.                                              Balinda Quails, M.D.   PGH/MEDQ  D:  12/15/2002  T:  12/16/2002  Job:  [id number]

## 2010-06-16 NOTE — Discharge Summary (Signed)
NAME:  Casey [name], Casey [name]                       ACCOUNT NO.:  [id number]   MEDICAL RECORD NO.:  [id number]                   PATIENT TYPE:  INP   LOCATION:  3705                                 FACILITY:  [facility]   PHYSICIAN:  Cassell Clement, M.D.              DATE OF BIRTH:  08/22/1940   DATE OF ADMISSION:  01/04/2002  DATE OF DISCHARGE:  01/12/2002                                 DISCHARGE SUMMARY   DISCHARGE DIAGNOSES:  1. Congestive heart failure.  2. Atrial fibrillation.  3. Chronic obstructive pulmonary disease.  4. Chronic lymphoid leukemia in remission.  5. Hypertension of cardiovascular disease.  6. History of tobacco usage.  7. Chronic Coumadin anticoagulation for atrial fibrillation.  8. History of cerebrovascular accident.   PROCEDURE:  None.   HISTORY OF PRESENT ILLNESS:  This 70 year old black female was admitted on  January 04, 2002, by Dr. [place] because of increased dyspnea associated with  cough and pedal edema and orthopnea.  On the day of admission, her shortness  of breath got acutely worse and she presented to the emergency room. She  also complained of mid substernal chest pain. She reports that she has been  taking her medication as prescribed.   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   PHYSICAL EXAMINATION:  GENERAL: A well-developed black female in acute  respiratory distress.  VITAL SIGNS:  Respiratory rate 36, blood pressure 141/76, pulse 110 and  irregular, temperature 97.  O2 saturations 91% on 2 liters.  NECK:  Elevated jugular venous distention, no carotid bruits.  LUNGS:  Diffuse expiratory wheezes.  HEART:  Tachycardic irregular rhythm without murmur to the prominent apical  lift.  ABDOMEN:  Soft and nontender.  EXTREMITIES:  Trace edema.  NEUROLOGY:  Right hemiparesis effecting predominantly the right arm.   HOSPITAL COURSE:  The patient was admitted to the telemetry unit. She  was  treated with IV Lasix and nebulizer therapy.  We will rule her out for a  myocardial infarction.  By the following day, she was less dyspneic after an  adequate diuresis. Chest x-ray was reviewed and showed increased heart size  since the previous x-ray.  Two-dimensional echocardiogram was requested and  did show severe LV dysfunction with an ejection fraction of only 25%. Coreg  was added to her regimen. The patient developed severe leg cramps secondary  to brisk diuresis from Lasix which had to be reduced. Her potassium was also  repleted.  By December 10, she was clinically improving with chest x-ray  showing clearing of CHF. She continued to have some course rhonchi and Z-Pak  and Humibid were added for evidence of bronchitis.  Her pro-time was  followed closely in the hospital.  The patient underwent physical therapy in  the hospital.  She tolerated the addition of Coreg with no exacerbation of  her heart failure.  Her  pro-time was slow to reach target, but did reach an  INR of 2.5 on January 12, 2002, at which point she was able to be  discharged.  For the three days prior to discharge, she had received 10 mg  of Coumadin each day.   DISCHARGE MEDICATIONS:  1. Coumadin 3 mg tablet and she is to take total of 9 mg on December 15 and     December 16 and be rechecked on December 17.  2. Folatex one tablet daily.  3. Zocor 20 mg daily.  4. Lasix 40 mg daily.  5. Avapro 150 mg daily.  6. Prilosec 20 mg daily.  7. Generic Ditropan 5 mg in the morning and 10 at night.  8. Combivent inhaler two puffs q.i.d.  9. Coreg 6.25 twice a day.  10.      Lanoxin 0.25 mg daily.  11.      Remeron 15 mg at h.s.  12.      Tylenol p.r.n.   ACTIVITY:  She is to walk as tolerated and she is to resume outpatient  physical therapy after discharge.   DIET:  No added salt, low cholesterol diet.   She is to call us if she experiences rapid weight gain or increased dyspnea.  She will see Dr.  Patty Sermons on December 17 for office visit and pro-time.   CONDITION ON DISCHARGE:  Improved.                                               Cassell Clement, M.D.    TB/MEDQ  D:  02/18/2002  T:  02/19/2002  Job:  [id number]

## 2010-06-16 NOTE — Discharge Summary (Signed)
NAME:  CATALEYAH, COLBORN                       ACCOUNT NO.:  [id number]   MEDICAL RECORD NO.:  [id number]                   PATIENT TYPE:  INP   LOCATION:  2036                                 FACILITY:  [facility]   PHYSICIAN:  Balinda Quails, M.D.                 DATE OF BIRTH:  07-Apr-1940   DATE OF ADMISSION:  12/09/2002  DATE OF DISCHARGE:  12/22/2002                                 DISCHARGE SUMMARY   ADMISSION DIAGNOSIS:  Threatened left femoral popliteal bypass secondary to  proximal disease.   DISCHARGE DIAGNOSES:  1. Proximal vein graft stenosis of the left femoral popliteal bypass.  2. History of cerebrovascular accident in September of 2003 with residual     right upper extremity hemiparesis.  3. Peripheral vascular occlusive disease status post left femoral below the     knee popliteal bypass in May of 2004.  4. Coronary artery disease.  5. Chronic atrial fibrillation on Coumadin therapy.  6. Congestive heart failure, last admission to the hospital on September,     2004 for this.  7. Chronic pulmonary obstructive disease.  8. Hypertension.  9. Dyslipidemia.  10.      Diabetes mellitus type 2.  11.      Chronic renal insufficiency.  12.      Gastroesophageal reflux disease.  13.      History of gout.  14.      History of migraine headaches.  15.      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  The     patient is being followed by [facility].   HOSPITAL PROCEDURES:  1. Abdominal aortogram with pelvic runoff arteriography and selective left     lower extremity arteriogram completed on December 11, 2002.  2. Repair of proximal vein graft stenosis, left femoral popliteal bypass     with Dacron patch angioplasty completed on December 15, 2002, performed     by Dr. Madilyn Fireman.  3. Routine postoperative arterial brachial indexes.   CONSULTATIONS:  1. Cardiac rehabilitation.  2. Pharmacy for heparin therapy.  3. Physical therapy.  4. Case Production designer, theatre/television/film.   HISTORY OF PRESENT ILLNESS:  Ms. Zipp is a 70 year old African-American  female who presented to the CVTS office to see Dr. Madilyn Fireman on November 30, 2002  for follow up of her left femoral popliteal bypass and left lower extremity  peripheral vascular occlusive disease.  Doppler evaluation on that day  revealed an ABI on the right of 0.45, and on the left of 0.87.  Further  imaging studies revealed a stenosis in the left external iliac artery and  proximal anastomosis of her left femoral popliteal bypass.  At that time,  Dr. Madilyn Fireman felt this most likely would threaten her graft patency and he  recommended arteriogram for further evaluation and possible percutaneous  intervention.  The procedures, risks,  and benefits were all discussed with  Ms. Biscoe at that time and she agreed with the plan to proceed with  arteriogram.  The patient discontinued her Coumadin prior to admission and  was admitted on December 09, 2002 to undergo heparin therapy for the planned  procedure.   HOSPITAL COURSE:  Ms. Meath was admitted on November 70, 2004 and was  started on IV heparin therapy for anticoagulation coverage.  The patient was  taken to the vascular lab, then on December 11, 2002 her PT and PTT were at  adequate levels for arteriogram.  Ms. Shull underwent an abdominal  aortogram, pelvic runoff arteriography and selective left lower extremity  arteriogram at that time.  The final impression of this study was that the  patient had high-grade stenosis at the origin of the left femoral popliteal  vein graft.  The results were reviewed with the patient at that time.  The  plan was to schedule an elective operative treatment for this high-grade  stenosis.  The patient remained in the hospital with plan of surgical  intervention in the near future.   On hospital day number three, the patient complained of urinary  frequency  and urgency.  She denied any dysuria.  A urinalysis was sent and came back  positive for a small amount of leukocytes and bacteria in her urine.  She  was started on oral ciprofloxacin prophylactically in preparation for her  upcoming surgical procedure.  The patient's other medical issues remained  stable over the next several days.  Her blood sugars were well controlled.  She remained in atrial fibrillation throughout this time.   The patient was taken to the operating room on December 15, 2002 at which  time she underwent repair of her proximal vein graft stenosis and left  femoral and popliteal bypass with a Dacron patch angioplasty.  The patient  tolerated the procedure well and was transferred to the recovery room in  stable condition.  Postoperative ABIs on postoperative day number one were  as follows:  right ABI was 0.48 and the left ABI was 0.98.  The impression  was that the right ABI indicated severe reduction in arterial flow, and the  left ABI was within normal limits.  There had been an increase in the ABI  postoperatively bilaterally.  The patient was continued on IV heparin  therapy as well as oral Coumadin until her INR reached a goal of 2.0.  The  patient remained stable over the next several days and with anxious  anticipation the patient for discharge to home.   She had no other medical issues besides the monitoring of her PTI, INR for  therapeutic level.  The patient was started on all of her other preoperative  medications.   At the time of discharge, Ms. Manzer had reached her goal INR of 2.0.  She  remained on oral Coumadin therapy and IV heparin therapy was discontinued.  Her incisions were clean, dry and intact without any drainage, redness or  warmth.  Her staples remained intact.  She was afebrile and vital signs were  stable.  She was ambulating in the hallway without difficulties.  Her bowels were regular and she was urinating without pain,  frequency or urgency.  Case  Management had visited the patient and plans were made for her daughter to  stay with her over the next week or two until she fully recovered.  It was  deemed appropriate for Ms. Shader then to be  discharged to home on  December 22, 2002.   DISCHARGE MEDICATIONS:  1. Toprol XL 100 mg p.o. daily.  2. Lanoxin 0.125 mg daily.  3. Amaryl 4 mg p.o. daily.  4. Colchicine 0.6 mg daily.  5. Ditropan 5 mg q.a.m. and 10 mg every evening.  6. Zocor 20 mg daily.  7. Warfarin 6 mg daily.  8. Ultram 50 mg 1-2 tablets p.o. q.4-6h p.r.n.   ALLERGIES:  The patient has a remote history of a rash with PENICILLIN.   DISCHARGE INSTRUCTIONS:  ACTIVITY:  The patient should avoid driving.  She  is to avoid heavy lifting or strenuous activity.  She is to walk daily.  DIET:  The patient should remain on a diet as she was preoperatively.  WOUND CARE:  The patient may shower.  She should gently wash her incisions  with soap and water daily.  She is to notify a health care Janaya Broy if she  has any increased redness, swelling or drainage from her incisions.  SPECIAL INSTRUCTIONS:  The patient should follow up for INR check with Dr.  Patty Sermons on Monday, December 27, 2002.  The patient is aware of this and  will arrange appointment time.  FOLLOW UP APPOINTMENTS:  1. The patient is to see Dr. Madilyn Fireman in the CVTS office in approximately three     weeks with ABIs on that day; the office will call her with appointment     date and time.  2. She will also have an appointment for staple removal in approximately one     week.  The office will call with that date and time as well.      Carolyn A. Arlean Hopping, M.D.    CAF/MEDQ  D:  12/21/2002  T:  12/22/2002  Job:  [id number]   cc:   Karlene Einstein, M.D.  Internal Medicine Resident - [place]  [place] [address]  Fax: [phone]   Cassell Clement, M.D.  [address]  [place]  [place]  [address]  Fax: [phone]

## 2010-06-16 NOTE — Consult Note (Signed)
NAME:  Casey [name], Casey [name]                       ACCOUNT NO.:  [id number]   MEDICAL RECORD NO.:  [id number]                   PATIENT TYPE:  IPS   LOCATION:  NA                                   FACILITY:  [facility]   PHYSICIAN:  Deanna Artis. Sharene Skeans, M.D.           DATE OF BIRTH:  [birth year]   DATE OF CONSULTATION:  10/27/2001  DATE OF DISCHARGE:                                   CONSULTATION   CHIEF COMPLAINT:  Right arm weakness.   HISTORY OF PRESENT CONDITION:  The patient is a 70 year old right-handed  African-American woman admitted to [facility] 10/19/2001 through  10/25/2001 by Dr. Ronny Flurry.  The patient had right occipital headache  and showed evidence of what was called a subacute infarction; however, it  was remote on the basis of diffusion weighted imaging on MRI scan.   The patient did have very tiny sub-centimeter lesions along a watershed  distribution of the left middle cerebral artery.  There was evidence of  significant edema in the right occipital region without increased diffusion  signal.  This marked the lesion as being subacute to remote.   The patient has had a longstanding history of hypertension, chronic atrial  fibrillation, prior congestive heart failure, and had been on Coumadin but  ran out the week prior to the admission and came in with fatigue.   The patient was evaluated by Dr. Ronny Flurry who found evidence of  increased homocysteinemia at 22.62.  The patient ruled out for myocardial  infarction.  Cholesterol 224, triglycerides 247,  VLDL 49, HDL 63, LDL 112,  total cholesterol to HDL 3.6.  CRP high sensitivity was 7.65 which was high  risk for myocardial infarction.  The patient had abnormal urinalysis  suggesting urinary tract infection; however, this was just a clean catch.   The patient had her Coumadin adjusted and was discharged with a PT of 19.1,  INR 1.7 without apparent weakness.   MRI of the brain, as I mentioned, had shown a  string of acute sub-centimeter  lesions in the left brain.  There was thickening of the diploic space.  MRA  showed no significant proximal stenosis and mild intracranial  atherosclerotic changes.   A 2-D echocardiogram showed 60 to 80% right internal carotid artery  stenosis, no evidence of left internal carotid artery stenosis, antegrade  vertebral flow bilaterally, external carotid artery stenosis bilaterally.  There was mild to moderate plaque in both internal and external carotid  arteries bilaterally.   EKG showed atrial fibrillation.   A 2-D echocardiogram showed marked decrease of left ventricular systolic  function with ejection fraction 20 to 30%, akinesis of the entire  anteroseptal wall, left ventricular wall thickness moderately increased, no  left ventricular thrombus identified.  Left atrium was mildly dilated.  Aortic valve thickness was increased, and the aortic valve was mildly to  moderately calcified.  No other definite abnormalities were seen.  The patient went home and on Saturday seemed to be well.  On Sunday at some  point she developed weakness in her right arm and mentioned it to her son  who called from [place] but did not mention that it was a significant new  finding.  Her daughter found out about it today when she came for a visit  and insisted that her mother come back to the hospital (against mother's  will).  It was there that she was found to have subtherapeutic INR and  evidence of right hemiparesis.  CT scan of the brain showed evidence of a  new left parietal ischemic infarction which must have developed between  discharge on 10/25/2001 and admission on 10/27/2001 given the history that the  patient left the hospital without focal deficits.   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   REVIEW OF SYSTEMS:   The patient has had some diarrhea.  She has complained  of a headache in her left forehead and around her left eye and had some  dizziness as well.  She has not been able to read.  The 12-system was  otherwise negative except as noted above.   MEDICATIONS:  1. Lasix 40 mg per day.  2. K-Dur 28 mEq 3 times a day.  3. Coumadin 5 mg per day.  4. Diovan 160 mg twice a day.  5. Lipitor 10 mg at nighttime.  6. Folic acid 1 mg per day.  7. Norvasc 10 mg per day.   The patient was to avoid salt, eat low-cholesterol diet, and to stop  smoking.  (She smoked a pack of cigarettes per day and only recently cut  down to two to three cigarettes per day.)   FAMILY HISTORY:  Father died at age 27 of stroke.  Mother died at age 64 of  unknown causes.  Sister died of lupus.   SOCIAL HISTORY:  The patient worked for [employer] and retired on medical  leave.  The patient drinks an occasional beer.  She is single.  She lives  with a grandson who is age 70.   ALLERGIES:  PENICILLIN.   PHYSICAL EXAMINATION:  GENERAL:  A thin woman who appears somewhat older  than her stated age.  VITAL SIGNS:  Blood pressure 151/78, resting pulse 86, respirations 16,  temperature 97.  HEENT AND NECK:  Ears, nose, and throat: No signs of infection.  No bruits.  LUNGS:  Clear.  HEART:  No murmurs, pulses normal, irregularly irregular pulse.  ABDOMEN:  Soft.  Bowel sounds normal.  No hepatosplenomegaly.  EXTREMITIES:  Normal.  NEUROLOGIC:  Mental Status: The patient is awake.  She is able to name  objects.  She follows commands.  There is some confusion.  She had some  difficulty with spontaneous language.  Cranial Nerves: Round, reactive  pupils.  Right homonomous hemianopsia (field type). She has some cataracts  that made it difficult to see fundi.  Fundi were normal with sharp disk  margins, no hemorrhages.  Symmetric facial strength.  Midline tongue and uvula.  Air conduction greater than bone conduction  bilaterally.  Motor  Examination:  Normal strength and no drift on the left side.  Right arm was  flaccid.  Right leg 3/5 proximally, 4+ to 5/5 distally.  Left hip flexor  4+/5, rest is 5/5.  Sensory: Mild alternation  in right facial sensation,  otherwise normal.  Good stereoagnosis on the right.  Cerebellar Examination:  Finger-to-nose, rapid repetitive movements were okay on the left, could not  be tested on the right.  Gait could not be tested.  Deep tendon reflexes: No  reflex predominance.  The patient had a  left extensor and a right flexor  plantar response.   IMPRESSION:  1. Acute left parietal ischemic infarction, cardiogenic source (434.11).  2. Remote late subacute right occipital cerebrovascular accident.  3. Hypertension with compensated congestive heart failure.  4. Chronic atrial fibrillation.   PLAN:  1. IV heparin.  2. Adjust Coumadin to INR of 3 with heparin overlap of 24 to 48 hours.  3. PT and OT consultation.   I appreciate the opportunity to see the patient.  If you have questions or I  can be of assistance, do not hesitate to contact me.  She will be seeing and  followed on stroke consultation service.                                                Deanna Artis. Sharene Skeans, M.D.    [facility]  D:  10/27/2001  T:  10/30/2001  Job:  [id number]   cc:   Darden Palmer., M.D.  [address], [address]  [place]  [place] [address]  Fax: [phone]

## 2010-06-16 NOTE — H&P (Signed)
NAME:  Casey [name], Casey [name]                       ACCOUNT NO.:  [id number]   MEDICAL RECORD NO.:  [id number]                   PATIENT TYPE:  INP   LOCATION:                                       FACILITY:  [facility]   PHYSICIAN:  Rosanne Sack, M.D.         DATE OF BIRTH:  12-22-1940   DATE OF ADMISSION:  06/08/2002  DATE OF DISCHARGE:                                HISTORY & PHYSICAL   CHIEF COMPLAINT:  Generalized weakness and deconditioning.   PROBLEM LIST:  1. Deconditioning and generalized weakness secondary to recent vascular     surgery and prolonged hospital stay.  2. New onset type 2 diabetes, Initial capillary blood glucose (CBG) when     discovered it the 600s, now roughly stable on oral medications, CBGs,     sliding scale insulin.  3. Uncontrolled hypertension, improved blood pressure though still somewhat     elevated in the 150 to 170 range.  4. Chronic renal insufficiency status post acute renal insufficiency and     dehydration.  Dehydration resolved with baseline BUN of 7 and creatinine     1.0.  5. Chronic atrial fibrillation, stable current dose Coreg.  Anticoagulation     in process, conversion Lovenox to Coumadin.  INR today 1.1 with pharmacy     following dosing.  6. Anemia of chronic disease with hemoglobin roughly stable postoperatively     at 11.6.  7. Peripheral vascular disease, original reason for hospitalization     05/27/2002.  Bilateral lower extremity claudication.     a. Status post CVTS consult with Dr. Madilyn Fireman.     b. Left leg femoral bypass graft 06/03/2002, Dr. Madilyn Fireman.  8. History of congestive heart failure.     a. Previous cardiologist, Dr. Patty Sermons.     b. A 2-D echocardiogram 01/15/2002 noted markedly decreased left        ventricular function with ejection fraction estimated 20 to 30%.        Repeat echocardiogram done 01/2002 noted ejection fraction reduced 25%        with A-V thickness mildly to moderately increased.  Left atrium  mildly        to moderately dilated.  Right ventricular systolic pressure was 35        mmHg.  There was mild to moderate tricuspid regurgitation.  Right        atrium was mildly dilated.  9. History of cerebrovascular accident with right hemiparesis.  Stroke to     the left frontal parietal area 09/2001.     a. MRI/MRA of the brain 10/28/2001 noted significant extension left        frontal parietal infarct.  Narrowing left internal carotid artery and        A1 segment left anterior cerebral artery.     b. Carotid Doppler study 09/2001 noted 60 to 80% stenosis right internal        carotid artery.  c. Status post neurology consult, Dr. Sharene Skeans.  10.      History of tobacco smoking.  11.      History of urinary tract infection, resolved.  12.      History of medication noncompliance.  13.      History of chronic lymphatic leukemia larynx and lung in 1998,     status post multiple rounds of chemotherapy, in remission.  14.      Hyperlipidemia on Zocor.  15.      Previous surgeries listed status post tubal ligation.  16.      Chronic obstructive pulmonary disease with 46-pack-year tobacco     history.   ALLERGIES:  PENICILLIN.   HISTORY OF PRESENT ILLNESS:  This 70 year old female presented to Dr. Madilyn Fireman  with complaints of bilateral lower extremity claudication.  Admitted for  surgical repair.  She had lower extremity outpatient arterial Doppler study  noting ABI right 0.24 and left 0.18.  Her symptoms have progressed since  2000.  She was admitted for surgical repair of the left lower extremity.  Had an abdominal aortogram with bilateral lower extremity runoff,  arteriogram.  This revealed single, widely patent bilateral renal arteries.  Inferior mesenteric patent.  Common iliac arteries normal.  Left internal  iliac artery high grade stenosis at origin.  Right internal iliac widely  patent.  Left external iliac provided flow to the common femoral.  Proceeded  with a left  femoral-popliteal bypass on 06/03/2002.  The patient tolerated the  procedure well.  Postoperatively per lab work, noted she was quite  hyperglycemic with blood glucose in to the 600s.  Internal medicine consult  obtained with [facility], Dr. Nolon Rod Monguilod.  Dr. Jamie Brookes followed along for medical problems including diabetes, mild  acute renal insufficiency/dehydration, hypertension.  She was placed  initially on insulin and then transitioned to oral medications. Currently  her diabetes status is roughly stable on oral medications.  She is still  covered, however, with sliding scale insulin per CBGs.   Her blood pressure medications were adjusted, and surgically she has  remained stable postoperatively.  She was though to have a urinary tract  infection and treated for this.  Her chronic atrial fibrillation remained  stable on Coreg.  Following surgery, she was returned to Coumadin, and  Lovenox started concurrently.  Her INRs still subtherapeutic at 1.1, thus  continues Lovenox.  This will be continued until INR therapeutic at 2.0 or  greater.  Pharmacy is following the Coumadin dosing.   She was found to be quite deconditioned postoperatively.  Transferred to  [facility] for continued physical and occupational therapy in that setting.  This  will be under the service of Dr. Nolon Rod Monguilod, [phone].   FAMILY HISTORY:  Mother died at age 51, high blood pressure, congestive  failure.  Father died at age 54 with stroke.  One brother deceased with  throat cancer.  One sister died with lupus.   SOCIAL HISTORY:  The patient is single.  Smoked approximately one pack daily  for 46 years.  Quite 10/2001.   REVIEW OF SYSTEMS:  No history of thyroid disease.  Has some trouble  swallowing.  No history of weight loss, diabetes, kidney disease, or asthma.  She does have COPD with longstanding smoking history.  She has a postive history of CVA.  No syncope or falls.   No significant angina.  She does have  history of chronic atrial fibrillation but no MI.  No history  of deep vein  thrombosis or pulmonary emboli.  No history of GI bleed or dysuria.  No  hematuria, reflux, or heart failure.  Does have some shortness of breath and  dyspnea on exertion, likely related to COPD.  Denies orthopnea.   MEDICATIONS AT TIME OF TRANSFER:  1. Zocor 20 mg daily at bedtime.  2. Avapro 150 mg daily.  3. Lasix 40 mg daily.  4. Protonix 40 mg daily.  5. Ditropan 5 mg a.m. and 10 mg p.m.  6. Coreg 3.125 mg twice daily.  7. Remeron 15 mg daily at bedtime.  8. Four times daily CBGs with Humulin regular sliding scale insulin coverage     as follows, 71 to 130, give 0 units; 131 to 160, give 1 unit; 161 to 200,     give 2 units; 201 to 250, give 4 units; 251 to 300, give 6 units; 301 to     350, give 8 units; 351 to 400, give 10 units; greater than 400, give 12     units and call.  9. Glucophage 800 mg a.m. and 500 mg p.m.  10.      Amaryl 4 mg twice daily.  11.      Coumadin currently 7.5 mg daily with pharmacy following dosing.  12.      Lovenox 80 mg subcutaneously every 12 hours, to continue until INR     2.0 or greater.  13.      OxyContin immediate release 5 to 10 mg every 4 hours as needed.  14.      Ultram 50 mg 1 or 2 tables equalling 100 mg every 6 hours as     needed.  15.      Tylenol 650 mg every 4 hours as needed.  16.      Dulcolax 10 mg equals two 5 mg tablets daily as needed for     constipation.  17.      Milk of magnesia 30 ml daily as needed.   PHYSICAL EXAMINATION:  VITAL SIGNS:  Temperature 97.2, pulse 85,  respirations 20, blood pressure 170/60.  GENERAL:  Well-developed, moderately obese female in no acute distress.  She  is alert and cooperative.  HEENT:  Head normocephalic.  Eyes: PERRLA.  Extraocular movements intact.  Ears: Canals clear and hearing grossly normal.  Nose: Nares patent without  mass or discharge.  Oral mucosa pink and  moist.  NECK:  No jugular venous distention or bruits noted.  No adenopathy or  thyromegaly.  HEART:  Rate and rhythm irregularly irregular with controlled rate.  No  murmur, S3, S4.  LUNGS:  Clear to auscultation bilaterally though somewhat reduced.  No  distress or cough.  BREASTS:  Deferred as noncontributory.  No axillary adenopathy.  ABDOMEN:  Soft and round with positive bowel sounds.  No pain or masses with  palpation.  No organomegaly noted.  URINARY/GENITAL:  No bladder pain or CVA tenderness.  RECTAL:  Deferred as noncontributory.  EXTREMITIES:  Status post left femoral-popliteal bypass.  Incision clean and  dry without erythema or increased warmth.  Closed with surgical staples, no  discharge noted.  Decreased pedal pulses 1/3.  No significant lower  extremity edema.  NEUROLOGIC:  Strength right side 5/5, left side 4/5.  Deep tendon reflexes  3/5 all extremities.  Cranial nerves II-XII grossly intact.  Downgoing toes.  LABORATORY DATA:  CBC 06/07/2002: WBC 7.4, hemoglobin 11.6, hematocrit 33.8,  platelets 182.  Last INR on 06/08/2002 was  1.1.  Metabolic panel 06/04/2002:  Sodium 143,  potassium 3.9, chloride 109, CO2 26, BUN 7, creatinine 1.0.  Serum TSH was 3.536.  Digoxin level 0.7.  Hemoglobin A1C 05/28/2002 elevated  at 11.5.  Urinalysis on admission showed 3 to 6 white blood cells and rare  bacteria.   Chest x-ray 05/27/2002 noted cardiomegaly and right lower lobe scar.   IMPRESSION AND PLAN:  1. Deconditioning/weakness.  The patient for [facility] placement today. Will     transfer to service of  Dr. Nolon Rod Monguilod [phone]).  Physical     and occupational therapy in that setting will follow along.  2. Uncontrolled diabetes.  Increase Glucophage 800 mg a.m. and continue 500     mg p.m.  Follow CBGs four times daily and cover with sliding scale     insulin as indicated.  3. Uncontrolled hypertension.  Will increase Avapro to 300 mg daily.  Follow     blood pressure and  renal function post transfer.  Basic metabolic panel     in a.m.  4. Chronic atrial fibrillation.  Stable on current dose of Coreg.  Follow     vital signs and exam post transfer.  5. Chronic anticoagulation.  In process now conversion Lovenox to Coumadin.     INR today 1.1.  Follow along with pharmacy as they adjust dosing.     Lovenox can be discontinued when INR 2.0 or greater.  6. Anemia of chronic disease.  Hemoglobin stable.  No signs of active     bleeding. Follow hemoglobin and hematocrit in a.m.  7. Peripheral arterial disease status post left femoral-popliteal bypass.     Surgical site stable.  Dr. Madilyn Fireman will follow along as consult post     transfer to [facility].   DISPOSITION:  Transfer [facility] for continued physical and occupational therapy.   CONDITION:  Stable.    ACTIVITY:  Up as tolerated with physical and occupational therapy.   DIET:  1800 calorie ADA, low-sodium, low-fat.      Everett Graff, N.P.                     Rosanne Sack, M.D.    TC/MEDQ  D:  06/08/2002  T:  06/09/2002  Job:  [id number]   cc:   Balinda Quails, M.D.  [address]  [place]  [place] [address]  Fax: [phone]   [facility]

## 2010-06-16 NOTE — H&P (Signed)
NAME:  Casey [name], Casey [name]                       ACCOUNT NO.:  [id number]   MEDICAL RECORD NO.:  [id number]                   PATIENT TYPE:  EMS   LOCATION:  [facility]                                 FACILITY:  [facility]   PHYSICIAN:  Lenon Curt. Chilton Si, M.D.               DATE OF BIRTH:  09-07-1940   DATE OF ADMISSION:  10/05/2002  DATE OF DISCHARGE:                                HISTORY & PHYSICAL   CHIEF COMPLAINT:  Short of breath and heart was fluttering.   HISTORY OF PRESENT ILLNESS:  A 70 year old black female, primary care Dr.  Kerry Dory, and under the cardiac care of Dr. Patty Sermons, who presented to  [facility] emergency room with complaints of shortness of breath,  increasing over the last two days.  She has had increased edema of the left  foot and leg, and is status post bypass graft of this femoral artery in May  2004.  The patient also felt like her heart went back into atrial  fibrillation.  Prior history indicates she is now in chronic atrial  fibrillation, and in fact is on Coumadin for this.  In the emergency room,  her chest x-ray did show an increase in interstitial markings of  cardiomegaly.  Brain natruretic peptide was also elevated.  EKG confirmed  atrial fibrillation with variable rates from 85 to 115.  She has a past  history of congestive heart failure, and previous 2-D echocardiograms have  disclosed 20-30% ejection fractions.  She denies any recent fever, chills,  nausea, or chest pains.   PAST HISTORY:  1. Known coronary artery disease.  2. Peripheral vascular disease with recent left femoral-popliteal bypass     graft May 2004.  3. September 2003 - left parietal embolic CVA with residual right upper     extremity hemiparesis and unstable gait.  4. 2000 - congestive heart failure.  5. History of anemia.  6. Hyperlipidemia.  7. COPD.  8. April 2004 - noninsulin-dependent diabetes mellitus.  9. Hypertension.  10.      History of renal insufficiency.  11.       Atrial fibrillation.  12.      GERD.  13.      Urinary incontinence.  14.      Gout.  15.      Depression.  16.      History of migraine headaches.  17.      Elevated homocysteine.  18.      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  This was     subsequently treated with a laser surgery by Dr. Nedra Hai, according to the     patient.  She then subsequently had radiation therapy, and then was     treated through the oncologist with initially chlorambucil and     prednisone, and then several courses of Cytoxan, vincristine, and  prednisone.  She has been followed regularly at the oncology clinic with     the last appointment being 2002, and with a consultation by Dr. Darnelle Catalan     last year when she was in the hospital with her strokes.   SURGERY:  1. Jun 03, 2002 - left femoral-popliteal bypass graft by Dr. Liliane Bade.  2. History of tubal ligation.   CONSULTANTS:  1. Cassell Clement, M.D., cardiology.  2. Balinda Quails, M.D., CVTS.  3. Ellwood Dense, M.D., rehabilitation.  4. Lowell C. Shinn, M.D./Gustav C. Magrinat, M.D., hemology/oncology.  5. Dr. Christophe Louis, optometry.  6. Kathy Breach, M.D., ENT.  7. Kathrin Greathouse, MD, radiation oncology.  8. Deanna Artis. Sharene Skeans, M.D., neurology.   PROCEDURES:  1. January 2004 and May 2003 - 2-D echocardiogram - ejection fractions 20-     30%, mild to moderate tricuspid regurgitation.  2. September 2003 - MRI of the brain.  Left frontoparietal infarction.  3. September 2003 - carotid Dopplers, 68% stenosis, right internal coronary     artery.  4. May 2004 - CT of the chest - adenopathy of the hilar region.  5. February 2001 - CT of the chest - right infrahilar node.  6. May 28, 2002 - abdominal aortogram with runoff, left common femoral     artery occlusion, and right popliteal occlusion.   ALLERGIES:  The patient denies any.  Prior records elude  to possible  penicillin reactions.   MEDICATIONS:  1. Hydrocodone/APAP 5/500 one q.6h. p.r.n. pain.  2. Prilosec 20 mg daily.  3. Folic-cyanocobalamin-pyridoxine one 1daily.  4. Zocor 20 mg daily.  5. Oxybutynin 5 mg in the morning, 10 mg in the evening.  6. Mi 15 mg q.h.s.  7. Warfarin 3 mg two tabs daily.  8. Toprol-XL 25 mg one daily.  9. Colchicine 0.6 mg daily.  10.      Amaryl 4 mg each morning.   FAMILY HISTORY:  Mother died at age 93 with hypertension and possible  cervical cancer.  Father died at age 33 of a stroke.  One brother died with  head and neck cancer.  One brother died in [place].  One sister died with  lupus.  Two children living.   SOCIAL HISTORY:  Was living with a grandson for the earlier parts of this  year.  Has reportedly no tobacco or alcohol use now, but has used both of  these extensively in the past.  She is retired from [employer].  Daughters  are Amie Critchley, at-home number [phone], or alternate number [phone],  and Dorette Grate at [phone].   REVIEW OF SYSTEMS:  GENERAL:  Other than her current complaint, she has had  no recent weight loss or serious weight gain.  She has had her bypass  surgery and has noticed some increased swelling down the left leg since  then.  EYES:  Blurry vision.  EARS/NOSE/THROAT:  No complaints.  RESPIRATORY:  See history of present illness.  Denies cough or hemoptysis.  HEART:  See history of present illness.  GI:  No complaints.  GU:  Incontinence of urine.  MUSCULOSKELETAL:  No complaints.  SKIN:  No  complaints.  NEUROLOGIC:  Balance has changed since her stroke last fall,  and she also has residual mild right upper extremity weakness.   PHYSICAL EXAMINATION:  VITAL SIGNS:  Temperature 97.7, pulse 105 and  irregular, with atrial fibrillation, respirations 20, blood pressure 166/116  (repeated 182/102).  GENERAL:  This is an  alert, cooperative, acutely ill, but lucid elderly female.  HEENT:  Sclerae white.   Pupils equal, round and reactive to light and  accommodation.  Prescription lenses present.  Pinnae, external auditory  canals, and TM's normal.  Hearing grossly normal.  Oropharynx without acute  lesions.  NECK:  Supple.  No thyromegaly, nodule mass, or audible bruit at this time.  NODES:  No palpable cervical, axillary, inguinal, or other areas.  BREASTS:  Nontender without nodules or mass.  CHEST:  Bilateral rales with shortness of breath at rest.  No rub or focal  pain.  HEART:  Heart rate 85-115.  Atrial fibrillation.  Left chest heave.  A grade  2/6 left sternal border systolic ejection murmur.  ABDOMEN:  Nontender.  No organomegaly, mass, or bruit.  GENITAL:  Deferred.  Done June 2004 with a normal Pap smear at [facility].  RECTAL:  Deferred.  EXTREMITIES:  Edema of the left lower extremity.  Scars in the left leg at  the left groin and calf.  SKIN:  Normal.  NEUROLOGIC:  Grossly normal.  Cranial nerves intact.  There was mild minimal  right upper extremity weakness.  There was no evidence of tremor.  She was  able to use both arms.   CHEST X-RAY:  Interstitial edema and cardiomegaly.   LABORATORY DATA:  Urinalysis normal.  CBC normal.  Complete metabolic panel  showed a glucose of 133, creatinine 1.0, BUN 12.  CK 76, MB 2.1.  Troponin  #1 was 0.02.  INR 2.5.  PTT 39.  BNP 920.   IMPRESSION:  1. 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  2. Noninsulin-dependent diabetes mellitus.  3. Hypertension.   PLAN:  The patient is to be admitted for diuresis and initiation of  additional therapy for congestive heart failure.  She apparently has been  poorly compliant with therapies in the past, and at least some of her  medical care has been obtained through [employer].  She has  recently established herself as a patient with Dr. Kerry Dory.  We will  notify Dr. Patty Sermons of her admission.   For this evening, we have started Coreg.   We will leave her on her standing  dose of Toprol for rate control at the present time.  Avapro was added for  blood pressure control, as well as strengthening the heart.  We will need to  watch renal function.                                                Lenon Curt Chilton Si, M.D.    AGG/MEDQ  D:  10/06/2002  T:  10/06/2002  Job:  [id number]   cc:   Cassell Clement, M.D.  [address]  [place]  [place] [address]  Fax: [phone]   P. Liliane Bade, M.D.  [address]  [place]  [place] [address]  Fax: [phone]

## 2010-06-16 NOTE — Discharge Summary (Signed)
[facility]  Patient:    Casey [name], Casey [name]                    MRN: [id number] Adm. Date:  [id number] Disc. Date: [id number] Attending:  Rudean Hitt                           Discharge Summary  FINAL DIAGNOSIS: 1.  Congestive heart failure. 2.  Hypertensive cardiovascular disease. 3.  Bronchitis. 4.  Cigarette use disorder. 5.  Chronic Coumadin anticoagulation. 6.  Remote lymphosarcoma. 7.  Hypertension. 8.  Atrial fibrillation, chronic.  OPERATION/PROCEDURE:  None.  HISTORY OF PRESENT ILLNESS:  This patient is a 70 year old black female admitted as an emergency on March 11, 1999 by Dr. Roger Shelter because of worsening dyspnea.  She has had chronic atrial fibrillation managed with rate control and  chronic Coumadin.  She presented to the emergency room on March 11, 1999 with complaints of chest pain and worsening dyspnea.  She was supposed to see Dr. Patty Sermons in the office early next week.  Blood pressure had been noted by the company nurse to be very elevated at 190/100 and her Zestril had been increased on an outpatient basis, and she was sent home to rest.  She came to the emergency oom around 12 p.m. being unable to sleep, with acutely worsening dyspnea, cough productive of frothy sputum, as well as some intermittent chest pain.  X-ray showed mild CHF and she was given IV Lasix and had a good initial diuresis.  PHYSICAL EXAMINATION:  VITAL SIGNS:  On physical examination her blood pressure was down to 152/77, rate 75 and irregular, respirations 20.  Room air oxygen saturation 96 per cent.  SKIN:  Warm and dry.  CHEST:  Rales in the lungs were noted one-fourth of the way up bilaterally.  HEART:  Irregularly irregular rhythm.  ABDOMEN:  Positive bowel sounds, nontender.  EXTREMITIES:  No edema.  NEUROLOGIC:  Intact.  LABORATORY DATA:  Initial laboratory studies showed CK-MB and Troponin were negative.   Digoxin level was 0.8.  Potassium was 3.4.  INR for her Coumadin was  3.3.  HOSPITAL COURSE:  She was admitted to telemetry.  A repeat 2-D echocardiogram was requested.  She did complain of chronic cough and she was switched from Zestril  over to Cozaar.  She was diuresed with IV Lasix.  Potassium was supplemented. Serial enzymes revealed CK was negative x three.  She showed gradual improvement while in the hospital.  Rhythm remained stable in atrial fibrillation with a controlled ventricular response.  After diuresis blood pressure was more easy to control.  She underwent a CT scan of the chest consistent with her old lymphoma but no evidence of any active lymphoma.  Her 2-D echocardiogram showed the left ventricle was normal in size and there was mild concentric LVH, ejection fraction was mildly reduced at 45 to 55 per cent, with septal hypokinesis.  The left atrium was moderately dilated at 5.28.  By March 15, 1999 she had been switched over from IV to p.o. Cardizem and blood pressure was stable at 140/70 supine, 120/70 standing.  Her lungs were clear and she was able to be discharged home.  It was felt that she would not be able to return to work for the foreseeable future and we recommended that she proceed to apply for medical disability.  DISCHARGE MEDICATIONS: 1.  Coumadin 5 mg 1 q.d.  2.  Z-pack as directed for bronchitis. 3.  Humibid LA 600 mg b.i.d. for chest congestion. 4.  K-Dur 20 mEq 1 q.d. 5.  Cardizem CD 120 mg q.d. 6.  Lasix 40 mg b.i.d. 7.  Lanoxin 0.25 mg q.d. 8.  Cozaar 50 mg q.d. 9.  Darvocet p.r.n. pain. 10. Tylenol 2 tablets p.r.n. pain.  She is to stop Zestril now that she is on Cozaar.  ACTIVITY:  She is to walk as tolerated.  She is disabled from work.  DIET:  She will be on a low-salt diet.  DISPOSITION:  She will be seen in the office in one week for an office visit, pro time, and a BMP.  DISCHARGE CONDITION:  Improved. DD:   04/05/99 TD:  04/07/99 Job: [id number]

## 2010-06-16 NOTE — Cardiovascular Report (Signed)
NAME:  Casey [name], Casey [name]                       ACCOUNT NO.:  [id number]   MEDICAL RECORD NO.:  [id number]                   PATIENT TYPE:  INP   LOCATION:  5522                                 FACILITY:  [facility]   PHYSICIAN:  Balinda Quails, M.D.                 DATE OF BIRTH:  1940/02/12   DATE OF PROCEDURE:  12/11/2002  DATE OF DISCHARGE:                              CARDIAC CATHETERIZATION   PHYSICIAN:  Balinda Quails, M.D.   DIAGNOSES:  Vein graft stenosis left femoral popliteal bypass.   PROCEDURE:  1. Abdominal aortogram.  2. Pelvic runoff arteriography.  3. Selective left lower extremity arteriogram.   ACCESS:  Right common femoral artery 5-French sheath.   CONTRAST:  110 mL of Visipaque.   COMPLICATIONS:  None apparent.   CLINICAL NOTE:  Casey [name] is a 70 year old diabetic female with a  history of advanced peripheral vascular disease.  She has previously  undergone a left femoral popliteal bypass for limb salvage.  Recent  surveillance revealed evidence of stenosis in the proximal segment of the  graft at the common femoral artery.  Brought to the catheterization  laboratory at this time for diagnostic arteriography and possible  percutaneous intervention.   PROCEDURE NOTE:  The patient brought to the catheterization lab in stable  condition.  Placed in the supine position.  Both groins prepped and draped  in the sterile fashion.  Administered 1 mg of Versed, 2 mg of Nubain  intravenously.  Skin and subcutaneous tissue in the right groin instilled  with 1% Xylocaine.  A needle was easily introduced into the right common  femoral artery.  A 0.035 J-wire passed through the needle into the mid  abdominal aorta.  The needle was removed and a 5-French sheath advanced over  the guidewire, the dilator removed, and sheath flushed with heparin and  saline solution.  A pigtail catheter was then advanced over the guidewire to  the mid abdominal aorta.  An AP mid abdominal  aortogram obtained.  This  revealed single widely patent bilateral renal arteries.  The inferior  mesenteric artery was noted to be widely patent also.  Common iliac arteries  bilaterally were widely patent with mild atherosclerotic irregularity.  The  external iliac arteries revealed no significant stenosis.  There was a tight  stenosis of the left hypogastric artery origin.  The right hypogastric  artery was widely patent.   Pelvic arteriography was obtained with the pigtail catheter drawn down to  the bifurcation.  This again verified widely patent common and external  iliac arteries bilaterally.  The hypogastric artery origin on the left  revealed a tight stenosis.   The pigtail catheter was hooked on the bifurcation and a 0.035 Glidewire was  advanced into the left external iliac artery.  A catheter exchange made for  an end hole catheter positioned in the left external iliac artery.  Left  lower extremity  runoff arteriography was obtained.  Initial views revealed  the left femoral popliteal vein graft to be widely patent throughout its  course.  There was an anastomosis to the distal below knee popliteal artery.  The runoff revealed intact posterior tibial and peroneal vessels.  Slow flow  was noted through the anterior tibial artery with reconstitution of the  dorsalis pedis distally.   Magnified oblique views of the origin of the left femoral popliteal bypass  graft common femoral level were obtained.  These did reveal a high grade  stenosis at the origin of the left femoral popliteal graft to the area of  anastomosis.  This was not felt to be a lesion that would be most suitably  treated with angioplasty.   The pigtail catheter was then drawn back up into the left common iliac  artery.  An oblique left iliac arteriogram was obtained.  This revealed the  left external iliac artery to be widely patent.   At completion the end hole catheter removed, the right femoral sheath   removed.  No apparent complications.   FINAL IMPRESSION:  High grade stenosis origin of left femoral popliteal vein  graft.   DISPOSITION:  These results have been reviewed with the patient.  She will  be scheduled for elective operative treatment of this.                                               Balinda Quails, M.D.    PGH/MEDQ  D:  12/11/2002  T:  12/12/2002  Job:  [id number]   cc:   [facility]   Cassell Clement, M.D.  [address], [address]  [place]  [place] [address]  Fax: [phone]

## 2010-06-16 NOTE — H&P (Signed)
NAME:  Casey [name], Casey [name].                      ACCOUNT NO.:  [id number]   MEDICAL RECORD NO.:  [id number]                   PATIENT TYPE:  INP   LOCATION:                                       FACILITY:  [facility]   PHYSICIAN:  Balinda Quails, M.D.                 DATE OF BIRTH:  July 08, 1940   DATE OF ADMISSION:  05/27/2002  DATE OF DISCHARGE:                                HISTORY & PHYSICAL   REFERRING PHYSICIANS:  1. Cassell Clement, M.D.  2. Ellwood Dense, M.D.   CHIEF COMPLAINT:  Bilateral lower extremity claudication.   HISTORY OF PRESENT ILLNESS:  The patient is [name] 70 year old black female with  bilateral claudication at 30 to 70 years.  ABI's are 0.24 on the right and  0.18 on the left. She complains of pain in the calf, pain dates back to 2000  when she retired from [employer] work.  She has had [name] progression of her symptoms  and was referred to CVTS and Dr. Madilyn Fireman by Dr. Thomasena Edis.   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  She has [name] history of hypertension,  hyperlipidemia for four years, history of congestive heart failure, and  atrial fibrillation.  Her last hospitalization was December 2003.  She also  has [name] history of tobacco use.  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   MEDICATIONS ON ADMISSION:  1. Zocor 20 mg daily.  2. Avapro 150 mg daily.  3. Coumadin 6 mg daily.  4. Foltx one daily.  5. Digitek 0.25 mg daily.  6. Lasix 40 mg daily.  7. Coreg 6.25 mg b.i.d.  8. Prilosec 20 mg daily.  9. Oxybutynin/Ditropan 5 mg one in the morning and two in the evening.   ALLERGIES:  PENICILLIN causes [name] rash.   REVIEW OF SYMPTOMS:  No history of thyroid disease, some trouble swallowing,  no history of weight loss, no history of diabetes, kidney disease, no  history of asthma, chronic obstructive pulmonary disease, TIA's, positive  history of  CVA.  No history of syncope, amaurosis.  She does not have any  angina to speak of.  She does have some arrhythmias, no myocardial  infarction, no history of deep venous thrombosis, pulmonary embolus, no  gastrointestinal bleed, no dysuria, no hematuria, no reflux, no heart  failure.  She does have shortness of breath and dyspnea on exertion.  She  denies PND.  She has two pillow orthopnea.   FAMILY HISTORY:  Mother died at 59 with high blood pressure and congestive  heart failure.  Father died age 99 with [name] stroke.  One brother is deceased  with throat cancer.  One sister is deceased with lupus.   SOCIAL HISTORY:  The patient is single.  Cigarettes less than one pack per  day  for 46 years.  Patient quit October 2003 with stroke.   PHYSICAL EXAMINATION:  VITAL SIGNS:  Blood pressure is 140/80 in right arm,  150/72 in the left.  Respirations were 80 and unlabored.  The patient was  alert and oriented.  HEENT: Head normocephalic. Eyes:  PERRLA.  EOM's intact.  There are soft  bruits, bilateral arcus senilis and partial plates.  NECK:  Supple.  No jugular venous distention.  No bruits on the right, soft  bruit on the left.  No thyromegaly.  No lymphadenopathy.  CHEST:  Symmetrical on inspiration.  No wheezing, rales or rhonchi.  CARDIAC:  Irregular with no murmurs, rubs or gallops.  ABDOMEN:  Soft, nontender, positive bowel sounds, no hepatosplenomegaly.  GENITOURINARY/RECTAL:  Deferred.  EXTREMITIES:  No cyanosis, clubbing or edema.  The left foot is cool, no  palpable pulses bilaterally.  NEUROLOGICAL:  Weak right hand.  Gait is slightly unsteady.  She is alert  and oriented, cranial nerves II-XII are intact. She drives herself to and  from her appointments.   IMPRESSION:  1. Bilateral lower extremity claudication.  2. 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  3. Hypertension.  4. Congestive heart failure, new onset atrial  fibrillation.  5. History of non-Hodgkin's lymphoma radiated without recurrence 16 years     ago.   PLAN:  Patient is admitted for heparin, arteriogram with further treatment  as indicated.     Eber Hong, P.[name].                 Balinda Quails, M.D.    WDJ/MEDQ  D:  05/27/2002  T:  05/27/2002  Job:  [id number]

## 2010-06-16 NOTE — Discharge Summary (Signed)
NAME:  Casey [name], Casey [name] NO.:  [id number]   MEDICAL RECORD NO.:  [id number]                   PATIENT TYPE:   LOCATION:                                       FACILITY:   PHYSICIAN:  Cassell Clement, MD                DATE OF BIRTH:  10/09/1940   DATE OF ADMISSION:  10/19/2001  DATE OF DISCHARGE:  10/25/2001                                 DISCHARGE SUMMARY   FINAL DIAGNOSES:  1. Cerebral artery occlusion with cerebral infarction.  2. Chronic atrial fibrillation.  3. Compensated congestive heart failure.  4. Urinary tract infection.  5. Hypokalemia.  6. Headache.  7. 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   HISTORY OF PRESENT ILLNESS:  This 70 year old black female was admitted with  a left-sided headache and left eye.  She had a CT scan of the brain in the  emergency room which showed a subacute right occipital infarct which is non-  hemorrhagic, but no evidence of any left brain acute stroke.  The patient  has a complex past medical history, including hypertension, chronic atrial  fibrillation, and compensated congestive heart failure.  She had been on  long-term Coumadin, but had missed her most recent appointment, and had run  out of her medications a week prior to coming to the hospital and had not  called for refills.  The patient denies any recent chest pain, but has been  dyspneic and markedly fatigued.   SOCIAL HISTORY:  She smokes occasional cigarettes and drinks occasional  beer.  She is single.  She lives with a grandson who is 57.  She herself is  retired from [employer].   PAST MEDICAL HISTORY:  1. She has had chronic lymphocytic lymphoma of the larynx.  Dr. Lyndal Pulley     is her physician.  2. History of a depressed left ventricular ejection fraction of 37% in 1998.   PHYSICAL EXAMINATION:  VITAL SIGNS:  Blood  pressure 177/91, pulse 95 and  irregularly irregular, respirations were 22.  NECK:  Carotids reveal no bruits, there is no lymphadenopathy.  HEART:  Reveals normal first and second heart sounds, no murmurs, rubs, or  gallops.  Rhythm is irregular, atrial fibrillation.  ABDOMEN:  Soft and nontender.  EXTREMITIES:  Show 1+ edema, there is no phlebitis.   HOSPITAL COURSE:  The patient was admitted.  Her initial labs in the  emergency room showed that her prothrombin time ws 13.1, and her INR was  0.9.  Other labs of interest were a low potassium of 2.9, BUN 11, creatinine  0.9, blood sugar 125.  Digoxin level was low.  CK-MB was 2.3, troponin-I was  negative.  White count was 7900.  Hemoglobin is 16.5.  The patient after  admission was treated  with IV heparin and kept on heparin until prothrombin  time was therapeutic.  The patient underwent a 2-D echocardiogram that  showed severe left ventricular systolic dysfunction.  The carotid Dopplers  showed moderate stenosis.  Rhythm remained in atrial fibrillation with a  slow ventricular response.  She was off all medications that would tend to  slow her heart.  Blood pressure remained high, and she was given an  increased dose of Zyban q.12h.  The heart rate remained slow response atrial  fibrillation.  Lanoxin was stopped because of the slow heart rate.  It was  noted that her lipids and her C-reactive protein were elevated, and she was  started on Lipitor.  Homocystine level was also felt to be elevated, and she  was started on folic acid.  She was relatively resistant to anticoagulation  with Coumadin.  On 10/24/01, she received 10 units of Coumadin stat for an  INR of 1.5.  On 10/25/01, INR had increased to 1.7, prothrombin time up to  19.1, and she was given 10 mg additional Coumadin prior to going home.  She  was anxious to be discharged home, and by 10/25/01, was ambulating well in  the hall, and her headaches had improved.  It was emphasized  to the patient  how important it was not to allow herself to run out of Coumadin in the  future.  Her blood pressure at discharge was improved on the higher dose of  Norvasc.  She was seen by the smoking cessation counselor one day prior to  discharge.   Telemetry showed a persistent atrial flutter fibrillation with a variable  response, and at times the pulse was slow despite not being on beta blockers  or Lanoxin.   LABORATORY DATA:  The patient had a MRI of the brain and of the head on  10/20/01, which showed bilateral parietal occipital areas of signal  alteration which did not correspond to the diffusion abnormality, and raised  the question of hypertensive encephalopathy with rise in water regulation  and vasogenic edema.  There was no abnormal intracranial enhancement seen  post contrast.  MR angiography of the carotids revealed no significant  proximal stenosis, and there was intracranial atherosclerotic change.   CONDITION ON DISCHARGE:  The patient is being discharged home improved on  10/25/01.   DISCHARGE MEDICATIONS:  1. Lasix 40 mg q.d.  2. K-Dur 20 mEq t.i.d.  3. Coumadin 10 mg on 10/25/01, then 5 mg q.d. starting on 10/26/01.  4. Diovan 160 mg b.i.d.  5. Lipitor 10 mg q.d. at night for cholesterol.  6. Folic acid 1 mg q.d. for heart.  7. Norvasc 10 mg q.d. for blood pressure.   ACTIVITY:  Walk as tolerated.   DIET:  Avoid salts, low cholesterol diet.   She is advised to stop Lanoxin and to stop Cardizem.  She is advised not to  smoke.   FOLLOWUP:  She is to see Dr. Patty Sermons on Wednesday, 10/29/01, for office  visit and prothrombin time, and she will call for an appointment.      Cassell Clement, MD                      Cassell Clement, MD    TB/MEDQ  D:  11/05/2001  T:  11/09/2001  Job:  [id number]

## 2010-06-16 NOTE — Discharge Summary (Signed)
NAME:  Casey [name], Casey [name]                       ACCOUNT NO.:  [id number]   MEDICAL RECORD NO.:  [id number]                   PATIENT TYPE:  INP   LOCATION:  2028                                 FACILITY:  [facility]   PHYSICIAN:  Balinda Quails, M.D.                 DATE OF BIRTH:  [birth year]   DATE OF ADMISSION:  05/27/2002  DATE OF DISCHARGE:  06/08/2002                                 DISCHARGE SUMMARY   PROVIDERS OF CARE:  Hospitalist is [employer], hospitalist Juan-  Elliot Gurney, M.D.  Cardiology is Cassell Clement, M.D.  Neurology is  Deanna Artis. Sharene Skeans, M.D.  Rehab consult in the past has been Ellwood Dense, M.D.   FINAL DIAGNOSES:  1. Peripheral vascular disease.  2. Newly-diagnosed diabetes.  3. Hypertension.  4. Congestive heart failure with ejection fraction of 20-30%.  5. Atrial fibrillation.  6. Bradycardia.  7. Dyslipidemia.  8. Chronic obstructive pulmonary disease.  9. Urinary tract infection.  10.      History of cerebrovascular accident with right hemiparesis     September 2003.  11.      Carotid artery disease.  12.      Non-Hodgkin's lymphoma.  13.      History of throat cancer, lymphoma resection.   PROCEDURES:  1. Left lower extremity femoral to below-knee popliteal bypass graft with     nonreversed greater saphenous vein on 06/03/02.  2. Postoperative ankle brachial indices on 06/03/02 showed left ABI going from     0.18 to 0.79, right ABI increasing from 0.24 to 0.5.   HOSPITAL COURSE:  The patient is a 70 year old female with history of  diabetes, atrial fibrillation, and stroke.  She is referred to CVTS  secondary to lower extremity pain, claudication, and decreased ABIs.  Arteriography was performed, and this showed severe bilateral disease.  Dr.  Madilyn Fireman recommended staged bilateral lower extremity revascularization.  She  was managed medically by [facility] hospitalist Dr. Jamie Brookes.  After several days of medical care and  tuning up, she underwent left lower  extremity femoral to below-knee popliteal bypass graft.  She tolerated the  procedure well.  Postop she did very well.  There were no major  complications. She was treated for her diabetes and also was treated for a  UTI postop.  She was also on heparin postoperatively.  Coumadin was started.  This was managed by pharmacy.  She was debilitated, and SACU was felt to be  appropriate for her.  By 06/07/02 she has made good progress.  She is  afebrile, vital signs are stable.  Her left lower extremity is well-  perfused.  There is a 2+ popliteal pulse.  Her INR has been slow to bound.  It is 1.1 today.  PTT is 45.  Pharmacy continues to manage the heparin and  Coumadin.  It is anticipated she should be suitable for transfer to  SACU  Monday, 06/08/02.   DISCHARGE MEDICATIONS:  1. Zocor 20 mg daily.  2. Avapro 150 mg daily.  3. Lasix 40 mg daily.  4. Protonix 40 mg daily.  5. Ditropan 5 mg in the morning and 10 mg in the evening.  6. Coreg 3.125 mg p.o. b.i.d.  7. Remeron 15 mg p.o. q.h.s.  8. Sliding scale insulin.  9. Coumadin and heparin as per pharmacy.  10.      Glucophage 500 mg p.o. b.i.d.  11.      Amaryl 4 mg p.o. b.i.d.  12.      Ultram 50-100 mg every four to six hours p.r.n. for pain.  13.      Tylenol 650 mg p.o. q.4h. p.r.n. pain.   ALLERGIES:  PENICILLIN.   CONDITION ON DISCHARGE:  Improved.   DISPOSITION:  SACU.   FOLLOW-UP:  CVTS will continue to follow along peripherally while the  patient is on SACU.     Lissa Merlin, Alver Sorrow, M.D.    Alwyn Ren  D:  06/07/2002  T:  06/07/2002  Job:  [id number]   cc:   Rosanne Sack, M.D.  [address]  [place], [place] [address]  Fax: [phone]   Cassell Clement, M.D.  [address]  [place]  [place] [address]  Fax: [phone]

## 2010-06-16 NOTE — Op Note (Signed)
NAME:  Casey [name], Casey [name]             ACCOUNT NO.:  [id number]   MEDICAL RECORD NO.:  [id number]          PATIENT TYPE:  AMB   LOCATION:  ENDO                         FACILITY:  [facility]   PHYSICIAN:  John C. Madilyn Fireman, M.D.    DATE OF BIRTH:  Jun 17, 1940   DATE OF PROCEDURE:  12/01/2003  DATE OF DISCHARGE:                                 OPERATIVE REPORT   PROCEDURE PERFORMED:  Colonoscopy with polypectomy.   ENDOSCOPIST:  Barrie Folk, M.D.   INDICATIONS FOR PROCEDURE:  Heme positive stool.   DESCRIPTION OF PROCEDURE:  The patient was placed in the left lateral  decubitus position and placed on the pulse monitor with continuous low-flow  oxygen delivered by nasal cannula.  The patient was sedated with 50 mcg of  IV fentanyl and 5 mg of IV Versed.  The Olympus video colonoscope was  inserted into the rectum and advanced to the cecum, confirmed by  transillumination of McBurney's point and visualization of the ileocecal  valve and appendiceal orifice.  The prep was excellent.  In the base of the  cecum, there was an 8 mm polyp near the appendiceal orifice, which was  removed by snare.  The ascending colon appeared normal.  Within the  transverse colon, there was a 6 mm polyp removed by snare.  Otherwise, no  abnormalities within the transverse, descending or sigmoid colon.  I did not  see any diverticula.  The rectum appeared normal and retroflex view of the  anus revealed no obvious internal hemorrhoids. The scope was then withdrawn  and the patient returned to the recovery room in stable condition.  She  tolerated the procedure well.  There were no immediate complications.   IMPRESSION:  Cecal and transverse colon polyps.   PLAN:  Await histology to determine method and interval for future colon  screening.      JCH/MEDQ  D:  12/01/2003  T:  12/01/2003  Job:  [id number]   cc:   Karlene Einstein, M.D.  Fax: [phone]   Cassell Clement, M.D.  [address], [address]   [place]  [place] [address]  Fax: [phone]

## 2010-06-16 NOTE — Discharge Summary (Signed)
NAME:  Casey [name], Casey [name]                       ACCOUNT NO.:  [id number]   MEDICAL RECORD NO.:  [id number]                   PATIENT TYPE:  INP   LOCATION:  2028                                 FACILITY:  [facility]   PHYSICIAN:  Rosanne Sack, M.D.         DATE OF BIRTH:  Apr 09, 1940   DATE OF ADMISSION:  06/08/2002  DATE OF DISCHARGE:  06/20/2002                                 DISCHARGE SUMMARY   CHIEF COMPLAINT:  Generalized weakness and deconditioning.   DISCHARGE DIAGNOSES:  1. Deconditioning and weakness secondary to prolonged hospital stay and     recent left femoral/popliteal bypass, post transfer to subacute care     unit.     A. Not felt dischargeable post physical and occupational therapy, but I        believe will require physical and occupational therapy at discharge        with home health.  2. Hypotension and orthostasis, improved with discontinuance of angiotensin     to walker.  3. Intermittent complaints of weakness, thought most likely secondary to     prednisone, now status post discontinuance of prednisone.     A. Diagnosed chronic atrial fibrillation, stable, blood pressure stable,        computed tomography scan of chest, negative pulmonary embolus, cardiac        enzymes unremarkable, and thyroid stimulating hormone within normal        limits.     B. Computed tomography scan of the chest, Jun 16, 2002, found no evidence        of pulmonary embolus.  There was hilar and mediastinal adenopathy with        prior history of lymphoma.  Felt this could represent acute lymphoma.        Noted left atrial enlargement and a scattered subsegmental        atelectasis, especially in lower lobes.  4. Uncontrolled diabetes with blood pressures roughly stable on current     medications.  Will maintain post discharge on Amaryl only.     A. Glucophage discontinued due to chronic renal insufficiency.  5. Acute gout, resolved, post steroid taper.     A. We will continue  prophylaxis with colchicine post discharge.  6. History of non-Hodgkin's lymphoma.     A. Will need to follow up with Dr. Patty Sermons as an outpatient post        discharge in this regard.  Noted abnormalities per chest computed        tomography scan.  7. Chronic atrial fibrillation, stable on current medications.     A. Status post discontinuance of Coreg and initiation of Toprol XL 25 mg        daily.     B. Previous medications, Coreg and digoxin, not continued post discharge.  8. History of cerebrovascular accidents, stable, with no neurologic changes     over hospitalization.  9. Peripheral vascular disease, status post  left femoral/popliteal bypass by     Dr. Madilyn Fireman, Jun 03, 2002.  10.      Chronic renal insufficiency, baseline BUN 7, creatinine 1.0.  11.      Chronic anticoagulation due to chronic atrial fibrillation, on     Coumadin.     A. Will resume previous 6-mg-daily dosing and request followup at Dr.        Yevonne Pax office, Jun 22, 2002, or Jun 23, 2002, for repeat        prothrombin time/International Normalized Ratio.  12.      History of congestive heart failure.     A. Followed in cardiology and primary care by Dr. Patty Sermons.     B. A 2-D echocardiogram, Jun 15, 2001, noted markedly decreased left        ventricular function with ejection fraction estimated 20 to 30%.  A        repeat echocardiogram, January 2004, noted ejection fraction of 25%        with atrioventricular thickness mildly to moderately increased.  Left        atrium mildly to moderately dilated.  Right ventricular systolic        pressure was 35 mmHg.  There was mild to moderate tricuspid        regurgitation, and the right atrium was mildly dilated.  13.      History of right hemiparesis, status post cerebrovascular accident,     September 2003.     A. Magnetic resonance image/magnetic resonance angiography of the brain,        October 28, 2001, noted significant extension left frontal parietal         infarct, narrowing left internal carotid artery, and A-1 segment of        the left anterior cerebral artery.     B. Doppler study, September 2003, of the carotids noted 68% stenosis,        right internal carotid artery.     C. Status post consult with neurology, Dr. Sharene Skeans.  14.      Tobacco smoking.  15.      History of urinary tract infection.  16.      History of medication noncompliance.  17.      History of chronic lymphatic leukemia, larynx and lung, 1998, with     multiple rounds of chemotherapy, reported previously in remission.  18.      Hyperlipidemia.  19.      Previous surgeries listed, tubal ligation.  20.      Chronic obstructive pulmonary disease with 46-year tobacco history.  21.      Allergies listed, no known drug allergies.   DISCHARGE MEDICATIONS:  1. Zocor 20 mg daily.  2. Protonix 40 mg daily but may substitute previous Prilosec 20 mg once     daily.  3. Ditropan 5 mg a.m. and 10 mg p.m.  4. Remeron 15 mg daily at bedtime.  5. Milk of Magnesium 30 mL daily.  6. Colchicine 0.6 mg daily.  7. Amaryl 4 mg daily, a.m.  8. Toprol XL 25 mg daily.  9. Coumadin 6 mg daily, in p.m.  10.      Vicodin 5/500 mg one tablet every six hours as needed for pain.  11.      Patient instructed not to restart Avapro, Digitek, Lasix, Coreg at     discharge.   CONSULTS OVER THE COURSE OF HOSPITALIZATION:  1. Dr. Madilyn Fireman, CVTS.  2. Rehab services,  subacute care unit.   PROCEDURES OVER THE COURSE OF HOSPITALIZATION:  A CT scan of the chest, Jun 16, 2002, with results as above.   SPECIAL DISCHARGE INSTRUCTIONS:   DISCHARGE DIET:  An 1800-calorie ADA, low-sodium, low-fat, low-cholesterol.   FOLLOW UP:  1. Follow up with Dr. Madilyn Fireman in approximately one week post discharge for     review of incision and staple removal; call [phone] for an appointment.  2. Follow up with Dr. Patty Sermons in one to two weeks; also contact his office    regarding lab draw for PT/INR done on Jun 22, 2002, or Jun 23, 2002, next     week.   ACTIVITY:  Home health physical and occupational therapy.   WOUND CARE:  Wound care instructions for the left fem/pop incision, clean  with mild soap and water and pat dry.  Notify Dr. Madilyn Fireman if any increased  incisional redness, pain, pussy discharge.   PLAN:  Should have a followup CBC and basic metabolic profile at next office  visit with Dr. Patty Sermons.  Also, ongoing followup of Coumadin PT/INR.   LABORATORY DATA:  PT/INR from Jun 19, 2002, 24.8 and 2.7, respectively.  Serum TSH normal, 1.846.  CBC done Jun 17, 2002, found WBC 6.5, hemoglobin  12.9, hematocrit 29.3, platelets 369.  Cardiac enzymes done on Jun 16, 2002,  CK 34, MB 1.0, troponin 0.01.  Metabolic panel, Jun 13, 2002, sodium 140,  potassium 4.1, chloride 104, CO2 of 29, BUN 32, and creatinine 1.2.  Last  serum uric acid was 6.0.   HISTORY AND PHYSICAL:  For dictated admission history of present illness,  family history, social history, review of systems, admission medications,  physical exam, laboratory data, impressions, and plan, please see dictated  admission history and physical, dated Jun 08, 2002.   HOSPITAL COURSE:  1. Weakness and deconditioning.  Patient was transferred to the subacute     care unit post prolonged hospital stay for left leg femoral/popliteal     bypass graft by Dr. Madilyn Fireman; for full details of this portion of     hospitalization, see that hospital discharge summary.  She was felt to be     too deconditioned and unstable in her gait to return home to her     independent living environment.  She was transferred to the subacute care     unit on Jun 08, 2002; this for ongoing physical and occupational therapy.     She progressed nicely with physical and occupational therapy in that     setting and will be tentatively ready for discharge to home on Jun 20, 2002.  She will likely need some continuance of physical and occupational     therapy with home  health post discharge.  Also, equipment needs, as     determined by the subacute care unit staff, will be provided.  2. Hypotension and orthostasis.  Patient complained of some dizziness with     ambulation.  Orthostatic blood pressures were checked, and there was a     significant drop in her systolic blood pressure, supine compared to     standing.  Her blood pressure medications were adjusted with her previous     ARB discontinued.  Post discontinuance of this medication, the     orthostasis appears to have resolved.  There was also some bradycardia     noted; this was also thought possibly contributory to the patient's     complaints.  She had been maintained on Coreg, and this was discontinued.     It was replaced with low-dose metoprolol, and at discharge, I am changing     this to Toprol XL at 25 mg daily.  Pulse rate has been stable on this,     and, again, the postural drop appears to have resolved. 3. Complaints of weakness and some chest tightness.  Patient does have a     known history of coronary artery disease, as outlined above.  She had     some intermittent complaints of some chest tightness.  Also, some     intermittent complaints of shortness of breath.  A CT scan of the chest     was done, Jun 16, 2002, essentially to evaluate for pulmonary emboli, and     this was negative; full report, as above.  Her cardiac enzymes were     checked times one set and were unremarkable.  A 12-lead EKG was     unchanged.  Serum TSH was evaluated, and this was negative.  Ultimately,     complaints thought quite possibly due to prednisone, which was provided     for an acute gout; the prednisone was discontinued prior to discharge.  4. Uncontrolled diabetes.  Blood glucose has been essentially stable on     current medications.  Initial portion of hospitalization, it was somewhat     uncontrolled with hyperglycemia.  Her previous medication, Glucophage,     was discontinued due to her  chronic renal insufficiency.  She is     maintained currently on Amaryl, and I will continue this dose post     discharge.  She is provided a Glucometer at discharge and instructions on     its utilization.  Blood glucose control and hemoglobin A1c will need to     be followed as an outpatient post discharge.  It must be noted that     diabetes was new-onset and first identified during the original portion     of this hospitalization, April 2004.  5. Acute gout.  The patient, post transfer to [facility], began complaining of     some left foot pain; x-ray of the left foot was obtained on Jun 08, 2002,     noting degenerative changes, particularly of tarsal bones, without acute     abnormality.  This was felt to be an acute flare of gout.  She was     started on a prednisone taper and colchicine.  Clinically, the patient     has improved nicely on this regime.  Prior to discharge, the prednisone     was discontinued.  She will continue on the colchicine at prophylactic     dosing post discharge.  6. History of non-Hodgkin's lymphoma.  This was felt to be in remission, per     admission hospital information.  CT scan report from Jun 16, 2002, of the     chest, as above, but this did note some adenopathy of the hilar region     with a node measuring 2.1 x 1.7 cm on the right and a hilar region, 1.6 x     1.4 cm, node on the left.  AP window lymph node 1.7 x 0.8.  Considering     her history, she should be reviewed post discharge at next appointment     with Dr. Patty Sermons for followup, as this could represent active lymphoma.     Perhaps a repeat oncology appointment  would be in order and followup     chest CT scan at some point.  7. Chronic atrial fib.  This has remained stable over the course of     hospitalization; this, despite change from Coreg to Toprol XL.  8. History of previous cerebrovascular accidents.  There have been no     neurologic changes over the course of hospitalization, and  patient has    remained stable from this perspective.  9. Peripheral vascular disease.  Again, patient had a left leg fem/pop     bypass, Jun 03, 2002, by Dr. Madilyn Fireman.  He followed her along during subacute     care unit setting and removed every other staple along the incision     during her subacute care stay; she tolerated this well.  At discharge,     the incision is well approximated, clean and dry, without erythema or     other signs of infection.  She was instructed to follow up with Dr. Madilyn Fireman     in approximately one week at his office, calling [phone] for an     appointment.  She was also instructed to monitor the site and inform of     any increased redness, pain, pussy discharge to Dr. Madilyn Fireman.   CONDITION AT TIME OF DISCHARGE:  Stable/improved, although fair with  multiple medical problems, as above.   ACTIVITY:  Up as tolerated.  Likely will need followup home health PT/OT.   DISPOSITION:  Returning home where she lives independently.   DISCHARGE PROCESS:  Greater than 30 minutes, 10:00 a.m. through 11:00 a.m.     Everett Graff, N.P.                     Rosanne Sack, M.D.    TC/MEDQ  D:  06/19/2002  T:  06/20/2002  Job:  [id number]   cc:   Cassell Clement, M.D.  [address], [address]  [place]  [place] [address]  Fax: [phone]   P. Liliane Bade, M.D.  [address]  [place]  [place] [address]  Fax: [phone]

## 2010-06-16 NOTE — H&P (Signed)
NAME:  Casey [name], Casey [name]                       ACCOUNT NO.:  [id number]   MEDICAL RECORD NO.:  [id number]                   PATIENT TYPE:  INP   LOCATION:  1829                                 FACILITY:  [facility]   PHYSICIAN:  W. Ashley Royalty., M.D.         DATE OF BIRTH:  [birth year]   DATE OF ADMISSION:  10/27/2001  DATE OF DISCHARGE:                                HISTORY & PHYSICAL   REASON FOR ADMISSION:  Right arm weakness.   HISTORY OF PRESENT ILLNESS:  This 70 year old black female has a history of  chronic headaches, decreased left ventricular function, and was discharged  just two days ago following an admission for a subacute right occipital  infarct which was nonhemorrhagic.  She also has chronic atrial fibrillation  and has been on longterm Coumadin, did not have money to purchase additional  medicine, and was subtherapeutic on admission.  During that hospitalization  she was found to have a slow ventricular response and was taken off of her  medicines to block the AV node.  She was treated with heparin.  Two-  dimensional echocardiogram showed severe LV systolic dysfunction with an EF  of around 20%.  MRI scan done on September 22, showed bilateral parieto-  occipital areas of signal alteration, question of a high participant  encephalopathy was noted.  There was an acute infarction in the left  hemisphere. There was no significant proximal stenosis.  There was  intracranial atherosclerotic disease noted at that time.  CT scan showed a  subacute infarct in the right high occipital lobe. Chest x-ray showed  cardiomegaly.  A carotid Doppler study showed a 60 to 80% right internal  carotid artery stenosis and mild to moderate plaque in the ICA and ECA  bilaterally.  There is no ICA stenosis.  There is antegrade vertebral artery  flow.  ECA stenoses were noted bilaterally.  She was discharged home.  Evidently she lives with a nephew.  The daughter I talked to tonight  said  that she laid in the bed all day yesterday and today.  Another daughter  checked on her and said that she was not right and was not talking right,  complaining of a bad headache, and was brought back to the emergency room  tonight.  She was found to have severe right arm weakness with some right  facial weakness, and when asked about the duration of the weakness, the  family was uncertain whether the arm was weak yesterday or whether it  happened today.  A CT scan showed a new left parietal stroke and she is  admitted at this time for treatment of this.   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  She is in clinical remission.  She has a prior history of  hypertension and cigarette abuse.  She was  recently diagnosed with  hyperlipidemia as well as elevated homocystine.   PAST SURGICAL HISTORY:  Tubal ligation.   ALLERGIES:  PENICILLIN.   MEDICATIONS:  1. Diovan 160 daily.  2. Furosemide 40 mg daily.  3. Coumadin 5 mg daily.  4. Potassium 20 mEq t.i.d.  5. Norvasc 10 mg daily.   SOCIAL HISTORY:    FAMILY HISTORY:   REVIEW OF SYSTEMS:  All unchanged from the previous record dictated on  October 19, 2001, except as noted above.   PHYSICAL EXAMINATION:  GENERAL:  She is a black female appearing her stated  age.  VITAL SIGNS:  Blood pressure is currently 140/74, pulse is currently 70 and  irregular.  SKIN:  Warm and dry.  HEENT:  EOMI. PERRLA.  CNS were clear, somewhat protuberant.  No hemorrhages  in the eyes.  Pharynx is negative.  NECK:  Supple without masses.  There is no JVD or thyromegaly.  No definite  bruits were noted.  LUNGS:  Clear.  HEART:  Normal S1 and S2, no S3.  Irregular rhythm.  ABDOMEN:  Soft and nontender. There is no mass.  Her femoral pulses are 2+,  peripheral pulses are 1+. There is no edema noted.  NEUROLOGY:  Right  facial weakness.  There is flaccid paralysis of the right  arm. The right leg has good strength to it.  Toes are downgoing bilaterally.   Chest x-ray showed cardiomegaly last week.  Pro-time INR is subtherapeutic  at 1.8.   IMPRESSION:  1. New left parietal infarct with dysarthria and right arm weakness, right     facial weakness.  2. Atrial fibrillation.  3. Subtherapeutic Coumadin anticoagulation.  4. Chronic hypertensive heart disease.  5. Compensated congestive heart failure.  6. History of lymphoma of larynx with previous treatment.  7. History of cigarette abuse.   RECOMMENDATIONS:  Admit.  Begin IV heparin.  Neurologic consultation.  Control blood pressure.  Try to get Coumadin therapeutic.  Stroke team to  see.                                               Darden Palmer., M.D.    WST/MEDQ  D:  10/27/2001  T:  10/27/2001  Job:  [id number]   cc:   Thomas A. Patty Sermons, M.D.   Deanna Artis. Sharene Skeans, M.D.  [address]  [place]  [place] [address]  Fax: [phone]

## 2010-06-16 NOTE — Op Note (Signed)
NAME:  Casey [name], Casey [name]                       ACCOUNT NO.:  [id number]   MEDICAL RECORD NO.:  [id number]                   PATIENT TYPE:  INP   LOCATION:  3316                                 FACILITY:  [facility]   PHYSICIAN:  Balinda Quails, M.D.                 DATE OF BIRTH:  10-24-1940   DATE OF PROCEDURE:  06/03/2002  DATE OF DISCHARGE:                                 OPERATIVE REPORT   PREOPERATIVE DIAGNOSIS:  Ischemic left lower extremity.   POSTOPERATIVE DIAGNOSIS:  Ischemic left lower extremity.   OPERATION PERFORMED:  Left femoral-popliteal bypass with reversed saphenous  vein graft.   SURGEON:  Balinda Quails, M.D.   ASSISTANT:  Lissa Merlin, P.A.   ANESTHESIA:  General endotracheal.   ANESTHESIOLOGIST:  Burna Forts, M.D.   INDICATIONS FOR PROCEDURE:  This is a 70 year old female with recent onset  diabetes, history of atrial fibrillation and a stroke.  She was referred to  the office with pain in her lower extremities and marked reduction in ankle  brachial indices.  Arteriography was performed.  In the left leg she had an  occlusion of the common femoral artery, reconstitution of the profunda  femoris, occlusion of the left superficial femoral artery,  reconstitution  of a diseased popliteal artery with diseased tibial artery run-off.  The  right lower extremity reveals an occluded right superficial femoral artery  and popliteal artery with reconstitution of the dominant anterior tibial  artery.  She is brought to the operating room at this time for left lower  extremity revascularization, planned left femoral popliteal bypass.   DESCRIPTION OF PROCEDURE:  The patient was brought to the operating room in  stable condition.  Placed in supine position.  General endotracheal  anesthesia induced.  Left leg prepped and draped in sterile fashion.  An  oblique skin incision made in the left groin.  Dissection carried down  through the subcutaneous tissue with  electrocautery.  The left  saphenofemoral junction exposed.  The proximal saphenous vein was free,  tributaries ligated with 3-0 silk and divided.  The common femoral artery  was then exposed.  The distal common femoral artery was occluded.  Proximal  dissection was carried up under the inguinal ligament.  There was an  excellent pulse at the junction of the external iliac and common femoral  arteries.  The left external iliac artery was freed and encircled with a  vessel loop.  This was the donor vessel for the bypass graft.   Attention then placed on the vein which was harvested throughout the length  of the left thigh through a separate longitudinal skin incision down to the  midcalf.  The vein tributaries were ligated with 3-0 silk and divided.  Through the medial incision, the popliteal fossa entered.  The left  popliteal artery exposed.  The most distal segment of the left popliteal  artery although diseased was  the softest segment and this was freed.  The  origin of the tibial peroneal trunk and anterior tibial artery were created  and encircled with vessel loops.  A subsartorial tunnel was made between the  knee and the groin.  The patient then administered 5000 units heparin  intravenously.  The left external iliac artery was controlled proximally and  distally with clamps.  A longitudinal arteriotomy made.  Although this  vessel was diseased with plaque, there was excellent inflow.  The reversed  vein graft was beveled and anastomosed end-to-side to the distal left  external iliac artery using running 6-0 Prolene suture.  At completion of  this proximal anastomosis, the vein graft was flushed, excellent flow was  present to the vein graft.  This was brought down through the subsartorial  tunnel in a reversed fashion.  The left popliteal artery was then controlled  proximally and distally with Seraphim clamps.  A longitudinal arteriotomy  made.  The artery was diseased with a  large posterior plaque extending down  into the tibial arteries.  The vein graft was beveled and appropriate length  and anastomosed end-to-side to the distal left popliteal artery using  running 6-0 Prolene suture.  At completion of the distal anastomosis,  flushing was carried out, clamps were removed and excellent flow was  present.   Adequate hemostasis was obtained.  Sponge, needle and instrument counts were  correct.  The subcutaneous tissue was drained through the saphenous vein bed  with a 15 round Jackson-Pratt drain, exited inferiorly through the skin,  fixed to the skin with a 2-0 silk suture.   The groin incision closed with a deep layer of running 2-0 Vicryl suture and  a superficial layer of running 3-0 Vicryl suture.  Staples applied to the  skin.  Vein harvest incisions were closed with a single layer of running 2-0  Vicryl suture and staples applied to the skin.  The popliteal incision was  closed with a deep layer of interrupted 2-0 Vicryl suture for the fascia.  Running 2-0 Vicryl suture for the subcutaneous layer and running 3-0 Vicryl  suture for the superficial subcutaneous layer.  Staples applied to the skin.   The patient tolerated the procedure well, was stable and transferred to the  recovery room without apparent complication.                                                Balinda Quails, M.D.    PGH/MEDQ  D:  06/03/2002  T:  06/04/2002  Job:  [id number]

## 2010-06-16 NOTE — Discharge Summary (Signed)
NAME:  Casey [name], Casey [name]                       ACCOUNT NO.:  [id number]   MEDICAL RECORD NO.:  [id number]                   PATIENT TYPE:  IPS   LOCATION:  4039                                 FACILITY:  [facility]   PHYSICIAN:  Ellwood Dense, MD                  DATE OF BIRTH:  06-05-1940   DATE OF ADMISSION:  10/30/2001  DATE OF DISCHARGE:  11/25/2001                                 DISCHARGE SUMMARY   DISCHARGE DIAGNOSES:  1. 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  2. Atrial fibrillation.  3. Congestive heart failure, compensated.  4. Renal insufficiency, resolved.  5. Situational depression.  6. Dyslipidemia.  7. Urgency, probably neurogenic in nature.   HISTORY OF PRESENT ILLNESS:  The patient is a 70 year old left-handed female  with a history of hypertension, migraines, and recent right occipital  infarct with chronic Coumadin for atrial fibrillation, readmitted on  October 27, 2001, with complaints of right arm numbness and dysarthria.  The INR was 1.8 at admission.  A CCT done showed more extensive  nonhemorrhagic infarcts in the left posterior parietal region.  MRI showed  significant left hemispheric infarct in left frontoparietal lobe and  occipital lobe with abnormal narrowing of left ICA and segment of left ECA.  Neurology was consulted for input and felt that acute infarcts were probably  cardioembolic in nature secondary to atrial fibrillation.  They recommended  INR goals of 3.0 or greater.  The patient continues on heparin as the INR is  at 2.6.  Her right upper extremity remains flaccid with mild right lower  extremity weakness noted.  PT was initiated and the patient is moderate  assist for bed mobility and minimal assist for transfers.  She ambulates 25  feet with problems advancing the right lower extremity secondary to  instability.   PAST MEDICAL HISTORY:  Significant for:  1. Hypertension.  2. Atrial fibrillation with an EF of  20%.  3. Right CVA.  Discharged recently on October 25, 2001.  4. Hypertension.  5. CLL of larynx treated with chemotherapy.  6. Migraines.  7. 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  8. Elevated homocystine levels.  9. CHF.   ALLERGIES:  PENICILLIN.   SOCIAL HISTORY:  The patient lives with family.  The house is two levels  with a few steps at entry.  She was smoking one pack per day.  Rare use of  alcohol.  She is retired from Designer, fashion/clothing.   HOSPITAL COURSE:  The patient was admitted to rehabilitation on October 30, 2001, for inpatient therapies to consist of PT and OT daily.  Post  admission, she was maintained on Coumadin for CVA prophylaxis.  Cassell Clement, M.D., has been following daily and adjusting her Coumadin.  The  patient remained in atrial fibrillation with heart rate control.  The last  EKG of November 14, 2001, showed atrial fibrillation with PVCs and left axis  deviation  with ST changes to be less prominent.  Check of labs showed the  patient to be getting dehydrated with a BUN of 48 and creatinine 1.7.  Also,  the BP was noted to be on the lower side.  The patient's Norvasc was held  and the Lasix dose was decreased.  Her renal status did improve and Lasix  was resumed at 20 mg every other day.  Check of electrolytes on the day of  discharge showed renal insufficiency to have resolved with sodium 145,  potassium 4.7, chloride 106, CO2 89, BUN 17, creatinine 1.3, and glucose  118.  The patient's weights have been followed along and is noted to have  gained approximately 6 pounds.  On discussion with Dr. Patty Sermons, Lasix was  increased to 20 mg every day for now.  The patient's blood pressures have  been reasonably stable in the last week.   During this stay there have been problems with depressed mood in part  secondary to aphasia.  Gladstone Pih, Ph.D., of neuropsychiatry was  consulted for input and has been following the patient along.  Also, the  patient was started  on Remeron with improvement in her affect and progress.  The patient's appetite has also picked up on this.  She has had problems  with frequency.  A UAUC checked on November 18, 2001, showed greater than  100,000 colonies of Escherichia coli.  She was started on Macrodantin for  this.  As she still continued with symptoms of urgency, she was started on  Ditropan with some improvement in her symptomatology.  On November 23, 2001,  the patient was noted to have cough with wheezing.  A chest x-ray done  showed no acute disease, however, due to a productive sounding cough, as  well as wheezing, she was started on Z-Pak and Humibid.  An inhaler was  ordered to be used post discharge.  Of note, a hemoglobin at admission  showed a hemoglobin of 16.3, hematocrit 49.4, white count 5.4, and platelets  246.  The patient's PT and INR at discharge are therapeutic at 29.8 and 3.8,  respectively.  The patient is discharged on 6 mg a day of Coumadin.  The  next pro time is to be drawn by the home health R.N. on November 18, 2001,  with the results to Dr. Yevonne Pax office.   At the time of admission, the patient was noted to have problems following  multistep commands with a decrease in high-level comprehension, as well as  requiring moderate assist for high-level expression.  No apraxia or  dysarthria was noted.  Speech therapy has been following the patient along.  Her expressive problems have decreased with the patient able to compensate  for word-finding problems.  Currently the patient's right upper extremity  strength is improving.  She still requires some cueing instructions to  position the right upper extremity during ambulation and transfers.  She is  currently at supervision for ADLs and modified independent for hygiene.  She  is modified independent for bed mobility, modified independent for  transfers, and supervision for ambulating 200+ feet on uneven surfaces.  She has decreased activity  tolerance and decreased stability of the right hip  secondary to weakness and timing sequencing deficits.  She is at risk  secondary to endurance and fatigue.  This puts her at a high risk for falls.  The family is to provide intermittent supervision post discharge.  Further  follow-up therapies to include home health PT  and OT by [facility].  A home health R.N. has been arranged to draw pro times with the results to  Cassell Clement, M.D.   DISPOSITION:  On November 25, 2001, the patient is discharged to home.   DISCHARGE MEDICATIONS:  1. Zithromax 250 mg p.o. per day x 3 days.  2. Zocor 20 mg q.h.s.  3. Foltx one per day.  4. Coumadin 6 mg per day.  5. Remeron 50 mg a day.  6. Combivent two puffs b.i.d.  7. Lasix 20 mg per day.  8. Avapro 150 mg per day.  9. Macrodantin 50 mg q.i.d.  10.      Prilosec 20 mg a day.  11.      Humibid LA 600 mg b.i.d.  12.      Ditropan 5 mg in a.m. and 10 mg in p.m.  13.      Vicodin one p.o. q.a.m. for headaches p.r.n.   ACTIVITY:  Intermittent supervision level.   DIET:  Low fat, low salt.   SPECIAL INSTRUCTIONS:  No alcohol and no smoking.  Home health R.N. to draw  pro times next on November 28, 2001, with results to Cassell Clement, M.D.   FOLLOW-UP:  The patient is to follow up with Cassell Clement, M.D., in two  weeks.  Follow up with Ellwood Dense, M.D., on January 05, 2002, at 10:30  a.m.     Dian Situ, P.A.          Ellwood Dense, MD    PP/MEDQ  D:  11/25/2001  T:  11/26/2001  Job:  [id number]   cc:   Cassell Clement, MD   C. Lesia Sago, M.D.  [address]  [address]  [place]  [place] [address]  Fax: [phone]

## 2010-06-16 NOTE — Discharge Summary (Signed)
NAME:  Casey [name], Casey [name]                       ACCOUNT NO.:  [id number]   MEDICAL RECORD NO.:  [id number]                   PATIENT TYPE:  INP   LOCATION:  4025                                 FACILITY:  [facility]   PHYSICIAN:  Cassell Clement, M.D.              DATE OF BIRTH:  November 27, 1940   DATE OF ADMISSION:  10/27/2001  DATE OF DISCHARGE:  10/30/2001                                 DISCHARGE SUMMARY   HISTORY OF PRESENT ILLNESS:  This is a 70 year old black female who had been  discharged two days earlier after admission for right occipital infarct.  She had had a history of chronic atrial fibrillation and CHF and was  subtherapeutic on admission.  Blood pressure was markedly elevated on  admission, but responded to the medication.  She had onset of right arm  numbness and was brought back to the emergency room complaining of headache  on the evening of October 27, 2001.  Duration of the right arm weakness  has improved, but not noted on discharge. Family was not available at  present for Dr. Donnie Aho to interview.   PHYSICAL EXAMINATION:  VITAL SIGNS:  Blood pressure 140/70.  CHEST:  Clear.  No S3 gallop.  ABDOMEN:  Negative.  EXTREMITIES: Pedal pulses were diminished.  She has a right flaccid weakness  of the right arm.   LABORATORY DATA:  INR on admission was 1.8.  CT scan showed a left parietal  infarct, but no bleeding. I felt that the patient had a new left brain CVA  in the face of chronic atrial fibrillation.  She was admitted and her  Coumadin was increased and she was placed on IV heparin until her pro-time  was therapeutic.  She was seen by Deanna Artis. Hickling, M.D. who diagnosed an  acute left parietal ischemia infarction of cardiogenic source.  He thought  that the Coumadin should be adjusted to an INR of 3.0  with heparin overlap  for 24 to 48 hours.  He recommended stroke team consult and physical therapy  and occupational therapy consults.  This was undertaken. It  was felt that  she would be a good candidate for the rehab service.  At the time of  transfer to the rehab service, she still had a flaccid paralysis of the  right arm.  By October 1, her INR was up to 2.3 and we continued to push her  INR further up to a goal INR of 3.0.  She was seen in consultation by the  physical medicine and rehab team who felt she would be a good candidate for  rehab.  A bed became available on October 30, 2001, and she was transferred  that date.  Cassell Clement, M.D.    TB/MEDQ  D:  12/17/2001  T:  12/17/2001  Job:  [id number]

## 2010-06-16 NOTE — Consult Note (Signed)
NAME:  Casey [name], Casey [name]                       ACCOUNT NO.:  [id number]   MEDICAL RECORD NO.:  [id number]                   PATIENT TYPE:  INP   LOCATION:  2028                                 FACILITY:  [facility]   PHYSICIAN:  Valentino Hue. Magrinat, M.D.            DATE OF BIRTH:  Nov 30, 1940   DATE OF CONSULTATION:  DATE OF DISCHARGE:  06/08/2002                                   CONSULTATION   HISTORY OF PRESENT ILLNESS:  The patient is a 70 year old woman currently in  the [facility] for rehabilitation following left femoral bypass grafting Jun 03, 2002.   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  This dates back at least to August  1988 when she was evaluated by Dr. Glenford Peers for difficulty swallowing.  She was found to have adenopathy in the supraglottic area and received  radiation to relieve the obstruction.  She was then briefly treated with  chlorambucil and prednisone (three months) and the following year, 1989, she  received six cycles of Cytoxan, vincristine, and prednisone.  The patient  was then followed off treatment and was last seen by Dr. Catha Gosselin in our office  in 2002.  She is otherwise followed by [facility] for her health needs.   PAST MEDICAL HISTORY:  The past medical history is extensive and well  delineated in Dr. Gailen Shelter note.  It includes:  1. Anemia of chronic illness.  2. Severe peripheral vascular disease with bilateral lower extremity     claudication, status post left femoral bypass grafting as noted above.  3. History of congestive heart failure, previously followed by Dr. Patty Sermons     with an ejection fraction in the 20%-30% per prior studies.  4. History of a cerebrovascular accident with right hemiparesis.  5. History of tobacco abuse.  6. History of hyperlipidemia.  7. History of bilateral tubal ligation.  8. History of chronic obstructive pulmonary disease.  9. History of  type 2 diabetes.  10.      History of uncontrolled hypertension.  11.      History of chronic renal insufficiency.  12.      History of chronic atrial fibrillation.   FAMILY HISTORY:  The patient's father died at the age of 81 from a stroke.  The patient's mother died at the age of 14 from complications of  hypertension.  The patient had one brother who died from head and neck  cancer, one brother was killed in [place], and one sister died from lupus.   GYNECOLOGIC HISTORY:  The patient has had two live births.   SOCIAL HISTORY:  She used to work at [employer].  She is single.  Prior to  admission, the patient was living with her daughter who works first shift.  The patient was receiving some outpatient therapy through the [facility].   HEALTH MAINTENANCE:  There is continuing  tobacco and apparently also ETOH  abuse.   REVIEW OF SYSTEMS:  The patient is currently asleep and unable to give me a  review of systems but per the prior notes in the chart she continues to have  some trouble swallowing.  There is no history of weight loss.  It is hard to  assess her energy level given the prior stroke and significant congestive  heart failure and peripheral vascular disease but there had been no history  of unusual infections, bleeding, or night sweats.   ALLERGIES:  PENICILLIN.   MEDICATIONS:  1. Zocor.  2. Avapro.  3. Lasix.  4. Protonix.  5. Ditropan.  6. Coreg.  7. Remeron.  8. Subcutaneous insulin.  9. Coumadin.  10.      Glucophage.  11.      Amaryl.   PHYSICAL EXAMINATION:  GENERAL:  A middle-aged black female.  VITAL SIGNS:  Temperature 97.3, pulse 46, respiratory rate 18, blood  pressure 102/65 with a CBG of 400.  I did not wake up the patient to do an  exam but Dr. Jamie Brookes notes no adenopathy and no organomegaly.   LABORATORY DATA:  Lab work shows a white cell count of 11.7 on May 13 and  dropping to 10.6 on May 16.  Hemoglobin was 12.6 with an MCV of 86.7  and her  platelet count was 369,000.   IMPRESSION AND PLAN:  A 70 year old [place] woman with a history of  chronic lymphoid leukemia/well-differentiated lymphocytic lymphoma present  at least since 1988 and treated in the past with radiation and oral and  intravenous chemotherapy (mild).  She was last seen in our office by Dr.  Lyndal Pulley May 2002.   At this point, the patient has multiple medical problems but no signs or  symptoms of active lymphoma.  From that point-of-view, she requires no  intervention.  We will follow peripherally during the hospitalization and  schedule her a yearly appointment as an outpatient once she is fully  ambulatory.                                               Valentino Hue. Magrinat, M.D.    Ronna Polio  D:  06/16/2002  T:  06/17/2002  Job:  [id number]   cc:   Rosanne Sack, M.D.  [address]  [place], [place] [address]  Fax: [phone]   [facility]   Deanna Artis. Sharene Skeans, M.D.  [address]  [address]  [place]  [place] [address]  Fax: [phone]   Cassell Clement, M.D.  [address]  [place]  [place] [address]  Fax: [phone]

## 2010-06-23 ENCOUNTER — Ambulatory Visit (INDEPENDENT_AMBULATORY_CARE_PROVIDER_SITE_OTHER): Payer: Medicare Other | Admitting: *Deleted

## 2010-06-23 DIAGNOSIS — I4891 Unspecified atrial fibrillation: Secondary | ICD-10-CM

## 2010-06-23 DIAGNOSIS — Z7901 Long term (current) use of anticoagulants: Secondary | ICD-10-CM

## 2010-06-23 LAB — POCT INR: INR: 2.2

## 2010-06-27 ENCOUNTER — Telehealth: Payer: Self-pay | Admitting: Cardiology

## 2010-06-27 NOTE — Telephone Encounter (Signed)
RN wanted [place] to know that her BP readings are progressivley getting lower.  Last Tues: 111/59, 110/54.  Today it is 97/52.  Pulse ranges btwn 52 and 61.  She is asymptomatic.

## 2010-06-27 NOTE — Telephone Encounter (Signed)
Call to Willia Craze RN from wellness clinic. Discussed with her Dr Yevonne Pax recommendations of Toprol XL 50mg  to be decreased to once a day. She asked that we call and advise the pt. 1344 advised pt to decrease Toprol to 50 mg. Pt verbalizes understanding.

## 2010-06-27 NOTE — Telephone Encounter (Signed)
This is Dr. Yevonne Pax pt.

## 2010-07-07 ENCOUNTER — Ambulatory Visit (INDEPENDENT_AMBULATORY_CARE_PROVIDER_SITE_OTHER): Payer: Medicare Other | Admitting: Cardiology

## 2010-07-07 ENCOUNTER — Ambulatory Visit (INDEPENDENT_AMBULATORY_CARE_PROVIDER_SITE_OTHER): Payer: Medicare Other | Admitting: *Deleted

## 2010-07-07 ENCOUNTER — Encounter: Payer: Self-pay | Admitting: Cardiology

## 2010-07-07 DIAGNOSIS — I10 Essential (primary) hypertension: Secondary | ICD-10-CM

## 2010-07-07 DIAGNOSIS — I4891 Unspecified atrial fibrillation: Secondary | ICD-10-CM

## 2010-07-07 DIAGNOSIS — Z7901 Long term (current) use of anticoagulants: Secondary | ICD-10-CM

## 2010-07-07 LAB — POCT INR: INR: 2.4

## 2010-07-07 NOTE — Assessment & Plan Note (Signed)
The patient has a past history of essential hypertension.  She has been feeling well on her present regimen and her blood pressure has been well-controlled.  She's having no headaches or dizzy spells.  No chest pain or angina or symptoms of congestive heart failure.

## 2010-07-07 NOTE — Progress Notes (Signed)
[name] Date of Birth:  1940-07-29 [facility] [address]   [address] [place], [place]  [address] [phone]           Fax   [phone]  History of Present Illness: This pleasant 70 year old woman is seen for a scheduled followup visit.  She has a history of hypertension diabetes chronic atrial fibrillation dyslipidemia and prior embolic strokes.  Since last visit she's been doing well.  She's not having any chest pain or shortness of breath.  No dizziness or syncope.  She gets a little confused about her medicine so her drugstore prepare is several weeks worth of pills in containers for her on a regular basis to keep her straight on her medicines.  Current Outpatient Prescriptions  Medication Sig Dispense Refill  . allopurinol (ZYLOPRIM) 100 MG tablet Take 100 mg by mouth daily.        Marland Kitchen amLODipine (NORVASC) 5 MG tablet Take 5 mg by mouth daily.        Marland Kitchen aspirin 81 MG tablet Take 81 mg by mouth daily.        . digoxin (LANOXIN) 0.125 MG tablet Take 125 mcg by mouth daily.        . furosemide (LASIX) 20 MG tablet Take 20 mg by mouth daily.        Marland Kitchen gabapentin (NEURONTIN) 300 MG capsule Take 300 mg by mouth at bedtime.        . levETIRAcetam (KEPPRA) 250 MG tablet Take 250 mg by mouth every 12 (twelve) hours.        . metoprolol (TOPROL-XL) 100 MG 24 hr tablet Take 50 mg by mouth 2 (two) times daily.        Marland Kitchen oxybutynin (DITROPAN) 5 MG tablet Take 5 mg by mouth 3 (three) times daily.        . potassium chloride (K-DUR,KLOR-CON) 10 MEQ tablet Take 10 mEq by mouth daily.        . simvastatin (ZOCOR) 20 MG tablet Take 1 tablet (20 mg total) by mouth at bedtime.  30 tablet  11    Allergies  Allergen Reactions  . Penicillins     Patient Active Problem List  Diagnoses  . Atrial fibrillation  . Chronic atrial fibrillation  . Stroke  . Hypertension  . Hypercholesterolemia  . Diabetes mellitus    History  Smoking status  . Former Smoker  .  Quit date: 01/29/2001  Smokeless tobacco  . Not on file    History  Alcohol Use No    No family history on file.  Review of Systems: Constitutional: no fever chills diaphoresis or fatigue or change in weight.  Head and neck: no hearing loss, no epistaxis, no photophobia or visual disturbance. Respiratory: No cough, shortness of breath or wheezing. Cardiovascular: No chest pain peripheral edema, palpitations. Gastrointestinal: No abdominal distention, no abdominal pain, no change in bowel habits hematochezia or melena. Genitourinary: No dysuria, no frequency, no urgency, no nocturia. Musculoskeletal:No arthralgias, no back pain, no gait disturbance or myalgias. Neurological: No dizziness, no headaches, no numbness, no seizures, no syncope, no weakness, no tremors. Hematologic: No lymphadenopathy, no easy bruising. Psychiatric: No confusion, no hallucinations, no sleep disturbance.    Physical Exam: Filed Vitals:   07/07/10 1049  BP: 120/70  Pulse: 70  The general appearance reveals a well-developed well-nourished [place] American woman with a evidence of a prior right hemiparesis.Pupils equal and reactive.   Extraocular Movements are full.  There  is no scleral icterus.  The mouth and pharynx are normal.  The neck is supple.  The carotids reveal no bruits.  The jugular venous pressure is normal.  The thyroid is not enlarged.  There is no lymphadenopathy.The chest is clear to percussion and auscultation. There are no rales or rhonchi. Expansion of the chest is symmetrical.The heart is irregular.  There is no murmur gallop rub or click.The abdomen is soft and nontender. Bowel sounds are normal. The liver and spleen are not enlarged. There Are no abdominal masses. There are no bruits.The pedal pulses are good.  There is no phlebitis or edema.  There is no cyanosis or clubbing. The skin is warm and dry.  There is no rash.   Assessment / Plan: Continue same medication.  Recheck her monthly  protimes and we will see her in 4 months

## 2010-07-07 NOTE — Assessment & Plan Note (Signed)
The patient has a past history of chronic atrial fibrillation.  She has had several previous embolic strokes.  She is on long-term Coumadin.  Recently her Coumadin regulation has been more satisfactory.  Her INR today is 2.4.  She has had no recent TIA or stroke symptoms.

## 2010-08-04 ENCOUNTER — Ambulatory Visit (INDEPENDENT_AMBULATORY_CARE_PROVIDER_SITE_OTHER): Payer: Medicare Other | Admitting: *Deleted

## 2010-08-04 DIAGNOSIS — I4891 Unspecified atrial fibrillation: Secondary | ICD-10-CM

## 2010-08-04 DIAGNOSIS — Z7901 Long term (current) use of anticoagulants: Secondary | ICD-10-CM

## 2010-08-04 LAB — POCT INR: INR: 2.1

## 2010-09-01 ENCOUNTER — Ambulatory Visit (INDEPENDENT_AMBULATORY_CARE_PROVIDER_SITE_OTHER): Payer: Medicare Other | Admitting: *Deleted

## 2010-09-01 DIAGNOSIS — Z7901 Long term (current) use of anticoagulants: Secondary | ICD-10-CM

## 2010-09-01 DIAGNOSIS — I4891 Unspecified atrial fibrillation: Secondary | ICD-10-CM

## 2010-09-29 ENCOUNTER — Ambulatory Visit (INDEPENDENT_AMBULATORY_CARE_PROVIDER_SITE_OTHER): Payer: Medicare Other | Admitting: *Deleted

## 2010-09-29 ENCOUNTER — Encounter: Payer: Medicare Other | Admitting: *Deleted

## 2010-09-29 DIAGNOSIS — Z7901 Long term (current) use of anticoagulants: Secondary | ICD-10-CM

## 2010-09-29 DIAGNOSIS — R0989 Other specified symptoms and signs involving the circulatory and respiratory systems: Secondary | ICD-10-CM

## 2010-09-29 DIAGNOSIS — I4891 Unspecified atrial fibrillation: Secondary | ICD-10-CM

## 2010-09-29 LAB — POCT INR: INR: 2.4

## 2010-09-29 NOTE — Patient Instructions (Signed)
Advised patient and will let [facility] know since they fill her pill box.

## 2010-10-06 ENCOUNTER — Telehealth: Payer: Self-pay | Admitting: Cardiology

## 2010-10-06 NOTE — Telephone Encounter (Signed)
Called because she received two brand new Glucose meters in the mail and does not know how to use them and is very confused. Please call back. I have pulled her chart.

## 2010-10-06 NOTE — Telephone Encounter (Signed)
Advised patient not sure what kind of monitor she may have.  She will take to the clinic they have on tuesdays where she lives.  [facility] manages her diabetes

## 2010-10-12 ENCOUNTER — Ambulatory Visit (INDEPENDENT_AMBULATORY_CARE_PROVIDER_SITE_OTHER): Payer: Medicare Other | Admitting: Vascular Surgery

## 2010-10-12 DIAGNOSIS — I739 Peripheral vascular disease, unspecified: Secondary | ICD-10-CM

## 2010-10-12 DIAGNOSIS — Z48812 Encounter for surgical aftercare following surgery on the circulatory system: Secondary | ICD-10-CM

## 2010-10-16 ENCOUNTER — Ambulatory Visit (INDEPENDENT_AMBULATORY_CARE_PROVIDER_SITE_OTHER): Payer: Medicare Other | Admitting: Vascular Surgery

## 2010-10-16 ENCOUNTER — Encounter: Payer: Self-pay | Admitting: Vascular Surgery

## 2010-10-16 VITALS — BP 126/59 | HR 54 | Resp 20 | Ht 65.0 in | Wt 153.0 lb

## 2010-10-16 DIAGNOSIS — I70219 Atherosclerosis of native arteries of extremities with intermittent claudication, unspecified extremity: Secondary | ICD-10-CM

## 2010-10-16 NOTE — Progress Notes (Signed)
Addended by: Clementeen Hoof on: 10/16/2010 12:48 PM   Modules accepted: Orders

## 2010-10-16 NOTE — Procedures (Unsigned)
BYPASS GRAFT EVALUATION  INDICATION:  Follow up peripheral vascular disease.  HISTORY: Diabetes:  Yes. Cardiac:  A fib. Hypertension:  Yes. Smoking:  Previous. Previous Surgery:  Left external iliac artery to popliteal artery bypass graft, left external iliac artery stent on 09/17/2008.  Right to left femoral to femoral bypass graft on 09/03/2008.  SINGLE LEVEL ARTERIAL EXAM                              RIGHT              LEFT Brachial: Anterior tibial: Posterior tibial: Peroneal: Ankle/brachial index:  PREVIOUS ABI:  Date: 09/06/2009  RIGHT:  0.54  LEFT:  0.99  LOWER EXTREMITY BYPASS GRAFT DUPLEX EXAM:  DUPLEX: 1. Elevated velocities present involving the right distal external     iliac artery and common femoral artery, suggestive of 50% to 75%     stenosis with a peak systolic velocity of 345 cm/s. 2. Mildly elevated velocity present at the proximal anastomosis of the     right femoral to left femoral bypass graft, which may be suggestive     of stenosis; however, no plaque is identified.  IMPRESSION: 1. Patent right femoral to left femoral bypass graft. 2. Patent left common femoral artery to popliteal artery bypass graft. 3. Right ankle brachial index is 0.49, which is in the moderate-to-     severe claudication range. 4. Left ankle brachial index is 0.93, which is in the mild     claudication range. 5. Bilateral decrease in the ankle brachial indices since 09/06/2009.  ___________________________________________ Quita Skye. Hart Rochester, M.D.  SH/MEDQ  D:  10/12/2010  T:  10/12/2010  Job:  [id number]

## 2010-10-16 NOTE — Progress Notes (Signed)
Subjective:     Patient ID: Casey [name], female   DOB: July 19, 1940, 70 y.o.   MRN: [id number]  HPI this 70 year old female returns today because of an abnormal duplex scan of her right lower extremity. She has a functioning right to left femoral-femoral bypass and a functioning left femoral popliteal saphenous vein graft. Last week she had a duplex scan performed which revealed an area of high velocity in the right external iliac artery above a patent right to left femoral-femoral bypass. The external iliac artery had a velocity of 375 cm/s. The femoral-femoral bypass and a left femoral-popliteal bypass grafts are patent. She does take chronic Coumadin therapy. She does not ambulate much because of a previous stroke.  Past Medical History  Diagnosis Date  . Chronic atrial fibrillation   . Stroke   . Hypertension   . Hypercholesterolemia   . Diabetes mellitus     History  Substance Use Topics  . Smoking status: Former Smoker    Types: Cigarettes    Quit date: 01/29/2001  . Smokeless tobacco: Never Used  . Alcohol Use: No    Family History  Problem Relation Age of Onset  . Cancer Mother   . Stroke Mother   . Stroke Father     Allergies  Allergen Reactions  . Penicillins     Current outpatient prescriptions:allopurinol (ZYLOPRIM) 100 MG tablet, Take 100 mg by mouth daily.  , Disp: , Rfl: ;  amLODipine (NORVASC) 5 MG tablet, Take 5 mg by mouth daily.  , Disp: , Rfl: ;  aspirin 81 MG tablet, Take 81 mg by mouth daily.  , Disp: , Rfl: ;  digoxin (LANOXIN) 0.125 MG tablet, Take 125 mcg by mouth daily.  , Disp: , Rfl: ;  furosemide (LASIX) 20 MG tablet, Take 20 mg by mouth daily.  , Disp: , Rfl:  gabapentin (NEURONTIN) 300 MG capsule, Take 300 mg by mouth at bedtime.  , Disp: , Rfl: ;  levETIRAcetam (KEPPRA) 250 MG tablet, Take 250 mg by mouth every 12 (twelve) hours.  , Disp: , Rfl: ;  metoprolol (TOPROL-XL) 100 MG 24 hr tablet, Take 50 mg by mouth 2 (two) times daily.  , Disp: ,  Rfl: ;  oxybutynin (DITROPAN) 5 MG tablet, Take 5 mg by mouth 3 (three) times daily.  , Disp: , Rfl:  potassium chloride (K-DUR,KLOR-CON) 10 MEQ tablet, Take 10 mEq by mouth daily.  , Disp: , Rfl: ;  simvastatin (ZOCOR) 20 MG tablet, Take 1 tablet (20 mg total) by mouth at bedtime., Disp: 30 tablet, Rfl: 11  BP 126/59  Pulse 54  Resp 20  Ht 5\' 5"  (1.651 m)  Wt 153 lb (69.4 kg)  BMI 25.46 kg/m2  Body mass index is 25.46 kg/(m^2).        Review of Systems denies rest pain in either lower extremity and has had no change in granulation. Denies chest pain.     Objective:   Physical Exam Blood pressure 126/59 heart rate 64 respirations 20 Chest good auscultation no rhonchi or wheezing Abdomen soft nontender with no masses Lower skin exam reveals 2-3+ femoral pulse palpable bilaterally. There is a thrill in the right femoral area. Left popliteal pulse is 2+. No evidence of ischemia distally.    Assessment:     Area of increased velocity in right external iliac artery proximal to right to left femoral-femoral bypass    Plan:     Return in 6 months with repeat ABIs and duplex  scan including right external iliac artery to see if any change occurs. Consider angiography at that time.

## 2010-10-19 ENCOUNTER — Encounter: Payer: Self-pay | Admitting: Vascular Surgery

## 2010-10-27 ENCOUNTER — Encounter: Payer: Medicare Other | Admitting: *Deleted

## 2010-11-02 ENCOUNTER — Ambulatory Visit (INDEPENDENT_AMBULATORY_CARE_PROVIDER_SITE_OTHER): Payer: Medicare Other | Admitting: *Deleted

## 2010-11-02 DIAGNOSIS — Z7901 Long term (current) use of anticoagulants: Secondary | ICD-10-CM

## 2010-11-02 DIAGNOSIS — I4891 Unspecified atrial fibrillation: Secondary | ICD-10-CM

## 2010-11-21 ENCOUNTER — Telehealth: Payer: Self-pay | Admitting: Cardiology

## 2010-11-21 NOTE — Telephone Encounter (Signed)
Advised  Dr. Patty Sermons doesn't monitor her diabetes

## 2010-11-21 NOTE — Telephone Encounter (Signed)
Calling about order for diabetic supplies that was faxed

## 2010-11-30 ENCOUNTER — Ambulatory Visit (INDEPENDENT_AMBULATORY_CARE_PROVIDER_SITE_OTHER): Payer: Medicare Other | Admitting: *Deleted

## 2010-11-30 DIAGNOSIS — Z7901 Long term (current) use of anticoagulants: Secondary | ICD-10-CM

## 2010-11-30 DIAGNOSIS — I4891 Unspecified atrial fibrillation: Secondary | ICD-10-CM

## 2010-12-25 ENCOUNTER — Telehealth: Payer: Self-pay | Admitting: Cardiology

## 2010-12-25 NOTE — Telephone Encounter (Signed)
Pt's dtr wants to know last ov with dr Patty Sermons, has appt this Friday 11-30

## 2010-12-25 NOTE — Telephone Encounter (Signed)
Left message

## 2010-12-27 ENCOUNTER — Telehealth: Payer: Self-pay | Admitting: Cardiology

## 2010-12-27 NOTE — Telephone Encounter (Signed)
Advised of last appointment

## 2010-12-27 NOTE — Telephone Encounter (Signed)
They have been trying to contact us regarding pt diabetic supplies

## 2010-12-27 NOTE — Telephone Encounter (Signed)
Advised form faxed back to contact PCP

## 2010-12-28 ENCOUNTER — Telehealth: Payer: Self-pay | Admitting: Cardiology

## 2010-12-28 NOTE — Telephone Encounter (Signed)
Re receipt of fax to dr Patty Sermons on this pt

## 2010-12-29 ENCOUNTER — Ambulatory Visit (INDEPENDENT_AMBULATORY_CARE_PROVIDER_SITE_OTHER): Payer: Medicare Other | Admitting: *Deleted

## 2010-12-29 ENCOUNTER — Ambulatory Visit (INDEPENDENT_AMBULATORY_CARE_PROVIDER_SITE_OTHER): Payer: Medicare Other | Admitting: Cardiology

## 2010-12-29 ENCOUNTER — Encounter: Payer: Self-pay | Admitting: Cardiology

## 2010-12-29 VITALS — BP 122/72 | HR 60 | Ht 65.0 in | Wt 153.0 lb

## 2010-12-29 DIAGNOSIS — E7800 Pure hypercholesterolemia, unspecified: Secondary | ICD-10-CM

## 2010-12-29 DIAGNOSIS — I10 Essential (primary) hypertension: Secondary | ICD-10-CM

## 2010-12-29 DIAGNOSIS — E119 Type 2 diabetes mellitus without complications: Secondary | ICD-10-CM

## 2010-12-29 DIAGNOSIS — Z7901 Long term (current) use of anticoagulants: Secondary | ICD-10-CM

## 2010-12-29 DIAGNOSIS — I4891 Unspecified atrial fibrillation: Secondary | ICD-10-CM

## 2010-12-29 DIAGNOSIS — E1140 Type 2 diabetes mellitus with diabetic neuropathy, unspecified: Secondary | ICD-10-CM | POA: Insufficient documentation

## 2010-12-29 NOTE — Telephone Encounter (Signed)
Left message for Trinna Post to call

## 2010-12-29 NOTE — Patient Instructions (Signed)
Will obtain INR today  Your physician recommends that you schedule a follow-up appointment in: 4 months with fasting labs (lp/bmet/hfp)

## 2010-12-29 NOTE — Progress Notes (Signed)
[name] Date of Birth:  1940/09/28 [facility] [address]   [address] [place], [place]  [address] [phone]           Fax   [phone]  HPI: This pleasant African American woman is seen for a scheduled four-month followup office visit.  This atrial fibrillation and prior embolic strokes.  She is on long-term Coumadin.  Since last visit she denies any new TIA symptoms or strokes.  The patient also has a history of high blood pressure and diabetes and dyslipidemia.  She has poor vision and a relative takes care of organizing her medication from the drugstore.  Current Outpatient Prescriptions  Medication Sig Dispense Refill  . allopurinol (ZYLOPRIM) 100 MG tablet Take 100 mg by mouth daily.        Marland Kitchen amLODipine (NORVASC) 5 MG tablet Take 5 mg by mouth daily.        Marland Kitchen aspirin 81 MG tablet Take 81 mg by mouth daily.        . digoxin (LANOXIN) 0.125 MG tablet Take 125 mcg by mouth daily.        . furosemide (LASIX) 20 MG tablet Take 20 mg by mouth daily.        Marland Kitchen gabapentin (NEURONTIN) 300 MG capsule Take 300 mg by mouth at bedtime.        . levETIRAcetam (KEPPRA) 250 MG tablet Take 250 mg by mouth every 12 (twelve) hours.        . metoprolol (TOPROL-XL) 100 MG 24 hr tablet Take 50 mg by mouth 2 (two) times daily.        Marland Kitchen oxybutynin (DITROPAN) 5 MG tablet Take 5 mg by mouth 3 (three) times daily.        . potassium chloride (K-DUR,KLOR-CON) 10 MEQ tablet Take 10 mEq by mouth daily.        . simvastatin (ZOCOR) 20 MG tablet Take 1 tablet (20 mg total) by mouth at bedtime.  30 tablet  11  . warfarin (COUMADIN) 5 MG tablet Take 5 mg by mouth as directed. 1 tablet 4 days a week and 1/2 tablet 3 days a week or as directed         Allergies  Allergen Reactions  . Penicillins     Patient Active Problem List  Diagnoses  . Atrial fibrillation  . Chronic atrial fibrillation  . Stroke  . Hypertension  . Hypercholesterolemia  . Diabetes mellitus     History  Smoking status  . Former Smoker  . Types: Cigarettes  . Quit date: 01/29/2001  Smokeless tobacco  . Never Used    History  Alcohol Use No    Family History  Problem Relation Age of Onset  . Cancer Mother   . Stroke Mother   . Stroke Father     Review of Systems: The patient denies any heat or cold intolerance.  No weight gain or weight loss.  The patient denies headaches or blurry vision.  There is no cough or sputum production.  The patient denies dizziness.  There is no hematuria or hematochezia.  The patient denies any muscle aches or arthritis.  The patient denies any rash.  The patient denies frequent falling or instability.  There is no history of depression or anxiety.  All other systems were reviewed and are negative.   Physical Exam: Filed Vitals:   12/29/10 1525  BP: 122/72  Pulse: 60   the general appearance reveals a elderly  African American woman in no distress.  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   Extraocular Movements are full.  There is no scleral icterus.  The mouth and pharynx are normal.  The neck is supple.  The carotids reveal no bruits.  The jugular venous pressure is normal.  The thyroid is not enlarged.  There is no lymphadenopathy.  The chest is clear to percussion and auscultation. There are no rales or rhonchi. Expansion of the chest is symmetrical.  Heart reveals an irregular rhythm and a soft systolic ejection murmur at the base.The abdomen is soft and nontender. Bowel sounds are normal. The liver and spleen are not enlarged. There Are no abdominal masses. There are no bruits.  The pedal pulses are good.  There is no phlebitis or edema.  There is no cyanosis or clubbing. Neurologic reveals right hemiparesisThe skin is warm and dry.  There is no rash.     Assessment / Plan: Checking a protime today.  She will return in 4 months for followup office visit lipid panel hepatic function panel and a basal  metabolic panel.  Continue same medication

## 2010-12-29 NOTE — Assessment & Plan Note (Signed)
The patient states that she is taking her warfarin as prescribed.  We're sending her for an INR today.  She has not had any TIA symptoms or bleeding problems.

## 2010-12-29 NOTE — Assessment & Plan Note (Signed)
The patient is a diabetic.  She is followed by [facility] care though she states she has not seen them in a while.  She denies any hypoglycemic episodes.

## 2010-12-29 NOTE — Assessment & Plan Note (Signed)
The patient denies any headaches or dizzy spells related to her high blood pressure.

## 2011-01-01 NOTE — Patient Instructions (Signed)
Pt called in to the coumadin clinic to inform us that she had received her coumadin tablets from the pharmacy and they are cut in half. Reviewed her instructions. She verbalizes understanding and will call the Coumadin Clinic with any questions.

## 2011-01-01 NOTE — Progress Notes (Signed)
Addended byEarma Reading R on: 01/01/2011 02:33 PM   Modules accepted: Level of Service

## 2011-01-08 ENCOUNTER — Ambulatory Visit (INDEPENDENT_AMBULATORY_CARE_PROVIDER_SITE_OTHER): Payer: Medicare Other | Admitting: *Deleted

## 2011-01-08 DIAGNOSIS — Z7901 Long term (current) use of anticoagulants: Secondary | ICD-10-CM

## 2011-01-08 DIAGNOSIS — I4891 Unspecified atrial fibrillation: Secondary | ICD-10-CM

## 2011-01-25 ENCOUNTER — Encounter: Payer: Medicare Other | Admitting: *Deleted

## 2011-02-02 ENCOUNTER — Ambulatory Visit (INDEPENDENT_AMBULATORY_CARE_PROVIDER_SITE_OTHER): Payer: Medicare Other | Admitting: *Deleted

## 2011-02-02 DIAGNOSIS — I4891 Unspecified atrial fibrillation: Secondary | ICD-10-CM

## 2011-02-02 DIAGNOSIS — Z7901 Long term (current) use of anticoagulants: Secondary | ICD-10-CM

## 2011-02-06 ENCOUNTER — Telehealth: Payer: Self-pay | Admitting: Cardiology

## 2011-02-06 NOTE — Telephone Encounter (Signed)
Pt requested refill of coumadin, [facility], pharmacy was to request on Friday and deliver today, pt has not received yet

## 2011-02-07 MED ORDER — WARFARIN SODIUM 5 MG PO TABS: 5.0000 mg | ORAL_TABLET | ORAL | Status: DC

## 2011-02-07 NOTE — Telephone Encounter (Signed)
Addended by: Raul Del on: 02/07/2011 08:21 AM   Modules accepted: Orders

## 2011-02-23 ENCOUNTER — Other Ambulatory Visit: Payer: Self-pay | Admitting: *Deleted

## 2011-02-23 MED ORDER — ASPIRIN 81 MG PO TABS: 81.0000 mg | ORAL_TABLET | Freq: Every day | ORAL | Status: DC

## 2011-02-23 NOTE — Telephone Encounter (Signed)
Refilled aspirin.

## 2011-03-21 ENCOUNTER — Telehealth: Payer: Self-pay | Admitting: Cardiology

## 2011-03-21 NOTE — Telephone Encounter (Signed)
Advised will have papers signed and forward to [facility]

## 2011-03-21 NOTE — Telephone Encounter (Signed)
New Msg: Pt calling wanting to check on status of pt papers that she left for Dr. Patty Sermons to fill out which would allow pt to ride [employer] bus. Please return pt call to discuss further.

## 2011-04-16 ENCOUNTER — Encounter: Payer: Self-pay | Admitting: Vascular Surgery

## 2011-04-17 ENCOUNTER — Ambulatory Visit: Payer: Medicare Other | Admitting: Vascular Surgery

## 2011-04-17 ENCOUNTER — Other Ambulatory Visit: Payer: Medicare Other

## 2011-04-17 ENCOUNTER — Telehealth: Payer: Self-pay | Admitting: Cardiology

## 2011-04-17 NOTE — Telephone Encounter (Signed)
New msg Pt's grandson called about paperwork that was sent about riding the [facility] bus for transportation. Please call him back

## 2011-04-17 NOTE — Telephone Encounter (Signed)
Patient's grandson called no answer,left message on voice mail Dr.Brackbill's nurse not in office today will send message to her,she will call 04/18/11.

## 2011-04-18 NOTE — Telephone Encounter (Signed)
Fu call °Patient returning your call °

## 2011-04-18 NOTE — Telephone Encounter (Signed)
Left message

## 2011-04-18 NOTE — Telephone Encounter (Signed)
Spoke with patient and advised information has been sent to [facility]

## 2011-06-18 ENCOUNTER — Encounter (HOSPITAL_COMMUNITY): Payer: Self-pay | Admitting: *Deleted

## 2011-06-18 ENCOUNTER — Inpatient Hospital Stay (HOSPITAL_COMMUNITY)
Admission: EM | Admit: 2011-06-18 | Discharge: 2011-06-21 | DRG: 637 | Disposition: A | Discharge status: Home Health | Payer: Medicare Other | Attending: Internal Medicine | Admitting: Internal Medicine

## 2011-06-18 ENCOUNTER — Emergency Department (HOSPITAL_COMMUNITY): Payer: Medicare Other

## 2011-06-18 DIAGNOSIS — Z79899 Other long term (current) drug therapy: Secondary | ICD-10-CM

## 2011-06-18 DIAGNOSIS — J180 Bronchopneumonia, unspecified organism: Secondary | ICD-10-CM | POA: Diagnosis present

## 2011-06-18 DIAGNOSIS — I4891 Unspecified atrial fibrillation: Secondary | ICD-10-CM | POA: Diagnosis present

## 2011-06-18 DIAGNOSIS — E1100 Type 2 diabetes mellitus with hyperosmolarity without nonketotic hyperglycemic-hyperosmolar coma (NKHHC): Principal | ICD-10-CM | POA: Diagnosis present

## 2011-06-18 DIAGNOSIS — A498 Other bacterial infections of unspecified site: Secondary | ICD-10-CM | POA: Diagnosis present

## 2011-06-18 DIAGNOSIS — N179 Acute kidney failure, unspecified: Secondary | ICD-10-CM | POA: Diagnosis present

## 2011-06-18 DIAGNOSIS — Q254 Other congenital malformations of aorta: Secondary | ICD-10-CM

## 2011-06-18 DIAGNOSIS — Z7901 Long term (current) use of anticoagulants: Secondary | ICD-10-CM

## 2011-06-18 DIAGNOSIS — R739 Hyperglycemia, unspecified: Secondary | ICD-10-CM

## 2011-06-18 DIAGNOSIS — E785 Hyperlipidemia, unspecified: Secondary | ICD-10-CM | POA: Diagnosis present

## 2011-06-18 DIAGNOSIS — Z7982 Long term (current) use of aspirin: Secondary | ICD-10-CM

## 2011-06-18 DIAGNOSIS — R7989 Other specified abnormal findings of blood chemistry: Secondary | ICD-10-CM

## 2011-06-18 DIAGNOSIS — I482 Chronic atrial fibrillation, unspecified: Secondary | ICD-10-CM

## 2011-06-18 DIAGNOSIS — E782 Mixed hyperlipidemia: Secondary | ICD-10-CM

## 2011-06-18 DIAGNOSIS — I10 Essential (primary) hypertension: Secondary | ICD-10-CM

## 2011-06-18 DIAGNOSIS — R627 Adult failure to thrive: Secondary | ICD-10-CM | POA: Diagnosis present

## 2011-06-18 DIAGNOSIS — I69959 Hemiplegia and hemiparesis following unspecified cerebrovascular disease affecting unspecified side: Secondary | ICD-10-CM

## 2011-06-18 DIAGNOSIS — Z880 Allergy status to penicillin: Secondary | ICD-10-CM

## 2011-06-18 DIAGNOSIS — Z794 Long term (current) use of insulin: Secondary | ICD-10-CM

## 2011-06-18 DIAGNOSIS — N390 Urinary tract infection, site not specified: Secondary | ICD-10-CM | POA: Diagnosis present

## 2011-06-18 DIAGNOSIS — I639 Cerebral infarction, unspecified: Secondary | ICD-10-CM

## 2011-06-18 DIAGNOSIS — Z823 Family history of stroke: Secondary | ICD-10-CM

## 2011-06-18 DIAGNOSIS — Z87891 Personal history of nicotine dependence: Secondary | ICD-10-CM

## 2011-06-18 DIAGNOSIS — E7800 Pure hypercholesterolemia, unspecified: Secondary | ICD-10-CM | POA: Diagnosis present

## 2011-06-18 LAB — URINE MICROSCOPIC-ADD ON

## 2011-06-18 LAB — DIFFERENTIAL
Lymphocytes Relative: 33 % (ref 12–46)
Lymphs Abs: 2.4 10*3/uL (ref 0.7–4.0)
Monocytes Relative: 9 % (ref 3–12)
Neutrophils Relative %: 55 % (ref 43–77)

## 2011-06-18 LAB — URINALYSIS, ROUTINE W REFLEX MICROSCOPIC
Bilirubin Urine: NEGATIVE
Glucose, UA: >1000 mg/dL — AB
Specific Gravity, Urine: 1.026 (ref 1.005–1.030)
Urobilinogen, UA: 0.2 mg/dL (ref 0.0–1.0)
pH: 5.5 (ref 5.0–8.0)

## 2011-06-18 LAB — CBC
HCT: 44.9 % (ref 36.0–46.0)
Hemoglobin: 15.3 g/dL — ABNORMAL HIGH (ref 12.0–15.0)
MCV: 83.8 fL (ref 78.0–100.0)
RBC: 5.36 MIL/uL — ABNORMAL HIGH (ref 3.87–5.11)
RDW: 14.3 % (ref 11.5–15.5)
WBC: 7.3 10*3/uL (ref 4.0–10.5)

## 2011-06-18 LAB — GLUCOSE, CAPILLARY: Glucose-Capillary: 423 mg/dL — ABNORMAL HIGH (ref 70–99)

## 2011-06-18 LAB — BASIC METABOLIC PANEL
BUN: 27 mg/dL — ABNORMAL HIGH (ref 6–23)
CO2: 22 mEq/L (ref 19–32)
Chloride: 93 mEq/L — ABNORMAL LOW (ref 96–112)
Glucose, Bld: 462 mg/dL — ABNORMAL HIGH (ref 70–99)
Potassium: 5 mEq/L (ref 3.5–5.1)
Sodium: 129 mEq/L — ABNORMAL LOW (ref 135–145)

## 2011-06-18 LAB — PROTIME-INR: Prothrombin Time: 42.2 seconds — ABNORMAL HIGH (ref 11.6–15.2)

## 2011-06-18 MED ORDER — SODIUM CHLORIDE 0.9 % IV SOLN
INTRAVENOUS | Status: AC
  Administered 2011-06-19: via INTRAVENOUS
  Filled 2011-06-18: qty 1

## 2011-06-18 MED ORDER — SODIUM CHLORIDE 0.9 % IV BOLUS (SEPSIS)
1000.0000 mL | Freq: Once | INTRAVENOUS | Status: AC
  Administered 2011-06-19: 1000 mL via INTRAVENOUS

## 2011-06-18 MED ORDER — SODIUM CHLORIDE 0.9 % IV SOLN
INTRAVENOUS | Status: DC
  Administered 2011-06-19: 01:00:00 via INTRAVENOUS

## 2011-06-18 MED ORDER — SODIUM CHLORIDE 0.9 % IV SOLN
INTRAVENOUS | Status: DC
  Administered 2011-06-19: 4.2 [IU]/h via INTRAVENOUS

## 2011-06-18 MED ORDER — DEXTROSE-NACL 5-0.45 % IV SOLN
INTRAVENOUS | Status: DC
  Administered 2011-06-19: 04:00:00 via INTRAVENOUS

## 2011-06-18 MED ORDER — SODIUM CHLORIDE 0.9 % IV SOLN: 1000.0000 mL | INTRAVENOUS | Status: DC

## 2011-06-18 MED ORDER — SODIUM CHLORIDE 0.9 % IV BOLUS (SEPSIS)
1000.0000 mL | Freq: Once | INTRAVENOUS | Status: AC
  Administered 2011-06-18: 1000 mL via INTRAVENOUS

## 2011-06-18 MED ORDER — SODIUM CHLORIDE 0.9 % IV SOLN
INTRAVENOUS | Status: AC
  Administered 2011-06-19: via INTRAVENOUS

## 2011-06-18 MED ORDER — CIPROFLOXACIN IN D5W 400 MG/200ML IV SOLN
400.0000 mg | Freq: Once | INTRAVENOUS | Status: AC
  Administered 2011-06-18: 400 mg via INTRAVENOUS
  Filled 2011-06-18: qty 200

## 2011-06-18 MED ORDER — DEXTROSE 50 % IV SOLN: 25.0000 mL | INTRAVENOUS | Status: DC | PRN

## 2011-06-18 MED ORDER — INSULIN REGULAR BOLUS VIA INFUSION
0.0000 [IU] | Freq: Three times a day (TID) | INTRAVENOUS | Status: DC
  Filled 2011-06-18: qty 10

## 2011-06-18 NOTE — ED Notes (Signed)
MD at bedside, admitting.

## 2011-06-18 NOTE — ED Provider Notes (Signed)
History     CSN: [id number]  Arrival date & time 06/18/11  1811   First MD Initiated Contact with Patient 06/18/11 2024      Chief Complaint  Patient presents with  . Hyperglycemia    (Consider location/radiation/quality/duration/timing/severity/associated sxs/prior treatment) HPI Pt reports she has had elevated blood sugar at home the last 2 days. Was reported in the 500 at home. EMS was apparently called by a neighbor although the patient denies any pain, fever, SOB, cough, N/V/D or dysuria. She is otherwise in her normal state of health.   Past Medical History  Diagnosis Date  . Chronic atrial fibrillation   . Stroke   . Hypertension   . Hypercholesterolemia   . Diabetes mellitus     Past Surgical History  Procedure Date  . Femoral to femoral bypass graft      Family History  Problem Relation Age of Onset  . Cancer Mother   . Stroke Mother   . Stroke Father     History  Substance Use Topics  . Smoking status: Former Smoker    Types: Cigarettes    Quit date: 01/29/2001  . Smokeless tobacco: Never Used  . Alcohol Use: No    OB History    Grav Para Term Preterm Abortions TAB SAB Ect Mult Living                  Review of Systems All other systems reviewed and are negative except as noted in HPI.   Allergies  Penicillins  Home Medications   Current Outpatient Rx  Name Route Sig Dispense Refill  . ALLOPURINOL 100 MG PO TABS Oral Take 100 mg by mouth daily.      Marland Kitchen AMLODIPINE BESYLATE 5 MG PO TABS Oral Take 5 mg by mouth daily.      . ASPIRIN 81 MG PO TABS Oral Take 1 tablet (81 mg total) by mouth daily. 30 tablet 11  . DIGOXIN 0.125 MG PO TABS Oral Take 125 mcg by mouth daily.      . FUROSEMIDE 20 MG PO TABS Oral Take 20 mg by mouth daily.      Marland Kitchen GABAPENTIN 300 MG PO CAPS Oral Take 300 mg by mouth at bedtime.      Marland Kitchen LEVETIRACETAM 250 MG PO TABS Oral Take 250 mg by mouth every 12 (twelve) hours.      Marland Kitchen METOPROLOL SUCCINATE ER 100 MG PO TB24 Oral Take  50 mg by mouth 2 (two) times daily.      . OXYBUTYNIN CHLORIDE 5 MG PO TABS Oral Take 5 mg by mouth 3 (three) times daily.      Marland Kitchen POTASSIUM CHLORIDE CRYS ER 10 MEQ PO TBCR Oral Take 10 mEq by mouth daily.      Marland Kitchen SIMVASTATIN 20 MG PO TABS Oral Take 1 tablet (20 mg total) by mouth at bedtime. 30 tablet 11  . WARFARIN SODIUM 5 MG PO TABS Oral Take 1 tablet (5 mg total) by mouth as directed. 1 tablet daily except 1/2 tablet on Tuesdays and Saturdays. 30 tablet 3    BP 134/59  Pulse 66  Temp(Src) 97.8 F (36.6 C) (Oral)  Resp 18  SpO2 98%  Physical Exam  Nursing note and vitals reviewed. Constitutional: She is oriented to person, place, and time. She appears well-developed and well-nourished.  HENT:  Head: Normocephalic and atraumatic.  Eyes: EOM are normal. Pupils are equal, round, and reactive to light.  Neck: Normal range of motion. Neck  supple.  Cardiovascular: Normal rate, normal heart sounds and intact distal pulses.   Pulmonary/Chest: Effort normal and breath sounds normal.  Abdominal: Bowel sounds are normal. She exhibits no distension. There is no tenderness.  Musculoskeletal: Normal range of motion. She exhibits no edema and no tenderness.  Neurological: She is alert and oriented to person, place, and time. No cranial nerve deficit or sensory deficit.       R sided hemiplegia is baseline from old stroke  Skin: Skin is warm and dry. No rash noted.  Psychiatric: She has a normal mood and affect.    ED Course  Procedures (including critical care time)  Labs Reviewed  CBC - Abnormal; Notable for the following:    RBC 5.36 (*)    Hemoglobin 15.3 (*)    All other components within normal limits  BASIC METABOLIC PANEL - Abnormal; Notable for the following:    Sodium 129 (*)    Chloride 93 (*)    Glucose, Bld 462 (*)    BUN 27 (*)    Creatinine, Ser 1.17 (*)    GFR calc non Af Amer 46 (*)    GFR calc Af Amer 53 (*)    All other components within normal limits  URINALYSIS,  ROUTINE W REFLEX MICROSCOPIC - Abnormal; Notable for the following:    APPearance CLOUDY (*)    Glucose, UA >1000 (*)    Hgb urine dipstick SMALL (*)    Ketones, ur 15 (*)    Nitrite POSITIVE (*)    Leukocytes, UA MODERATE (*)    All other components within normal limits  GLUCOSE, CAPILLARY - Abnormal; Notable for the following:    Glucose-Capillary 480 (*)    All other components within normal limits  URINE MICROSCOPIC-ADD ON - Abnormal; Notable for the following:    Squamous Epithelial / LPF FEW (*)    Bacteria, UA MANY (*)    All other components within normal limits  DIFFERENTIAL  URINE CULTURE  APTT  PROTIME-INR   No results found.   No diagnosis found.    MDM  Pt with UTI, likely as the cause of her hyperglycemia. Will admit for hydration, Abx and glucose management.         Allysson Rinehimer B. Bernette Mayers, MD 06/18/11 2252

## 2011-06-18 NOTE — ED Notes (Signed)
Attempted to try PIV start w/o success due to poor veins.  Christa, RN looked and agrees with poor vein access.  Paged IV therapy

## 2011-06-18 NOTE — ED Notes (Signed)
Pt.has a month hx. Of high blood sugar.  Pt.'s blood sugars have been running in the "500's."  Today pt.'s meter read high and pt. Was sent here by her neighbor.  Per EMS pt.'s blood sugar was 532.  Pt. denies n/v/d, pain, or SOB.

## 2011-06-18 NOTE — ED Notes (Signed)
Hospitalist notified of patient's HR ranging from 30's-60's. R.N also aware.

## 2011-06-18 NOTE — H&P (Signed)
PCP:   Vida Roller, MD, MD   Chief Complaint:  High sugars for 2 days, generalized weakness for 1 day. HPI: Casey [name] came in with high sugars since yesterday. She states that the meter showed unreadable value. In ED her sugars were noted to be greater than 400mg /dl. She has since been started on IVF and GlucoStabilizer and referred for admission. She was also found to have uti, hence given cipro. She states that her sugars have been high in the last month, but insists she has been compliant with her meds and diet. She has been increasingly thirsty. She denies fever, diarrhea or dysuria or cough. She has lost significant amount of weight in the last 2 months such that she now wears size 10 instead of size 12.  Review of Systems:  Unremarkable except as highlighted in the hpi. Past Medical History: Past Medical History  Diagnosis Date  . Chronic atrial fibrillation   . Stroke   . Hypertension   . Hypercholesterolemia   . Diabetes mellitus    Past Surgical History  Procedure Date  . Femoral to femoral bypass graft      Medications: Prior to Admission medications   Medication Sig Start Date End Date Taking? Authorizing Provider  allopurinol (ZYLOPRIM) 100 MG tablet Take 100 mg by mouth daily.     Yes Historical Provider, MD  amLODipine (NORVASC) 5 MG tablet Take 5 mg by mouth daily.     Yes Historical Provider, MD  aspirin 81 MG tablet Take 1 tablet (81 mg total) by mouth daily. 02/23/11  Yes Cassell Clement, MD  digoxin (LANOXIN) 0.125 MG tablet Take 125 mcg by mouth daily.     Yes Historical Provider, MD  furosemide (LASIX) 20 MG tablet Take 20 mg by mouth daily.     Yes Historical Provider, MD  gabapentin (NEURONTIN) 300 MG capsule Take 300 mg by mouth at bedtime.     Yes Historical Provider, MD  levETIRAcetam (KEPPRA) 250 MG tablet Take 250 mg by mouth every 12 (twelve) hours.     Yes Historical Provider, MD  metoprolol (TOPROL-XL) 100 MG 24 hr tablet Take 50 mg by mouth 2  (two) times daily.     Yes Historical Provider, MD  oxybutynin (DITROPAN) 5 MG tablet Take 5 mg by mouth 3 (three) times daily.     Yes Historical Provider, MD  potassium chloride (K-DUR,KLOR-CON) 10 MEQ tablet Take 10 mEq by mouth daily.     Yes Historical Provider, MD  simvastatin (ZOCOR) 20 MG tablet Take 1 tablet (20 mg total) by mouth at bedtime. 06/08/10  Yes Cassell Clement, MD  warfarin (COUMADIN) 5 MG tablet Take 1 tablet (5 mg total) by mouth as directed. 1 tablet daily except 1/2 tablet on Tuesdays and Saturdays. 02/07/11   Cassell Clement, MD    Allergies:   Allergies  Allergen Reactions  . Penicillins     Social History:  reports that she quit smoking about 10 years ago. Her smoking use included Cigarettes. She has never used smokeless tobacco. She reports that she does not drink alcohol or use illicit drugs. Lives alone.  Family History: Family History  Problem Relation Age of Onset  . Cancer Mother   . Stroke Mother   . Stroke Father     Physical Exam: Filed Vitals:   06/18/11 1815 06/18/11 2139 06/18/11 2230  BP: 134/59 162/42 155/56  Pulse: 66 76 57  Temp: 97.8 F (36.6 C)    TempSrc: Oral  Resp: 18 16 18   SpO2: 98% 100% 97%  Patient shivering, sick looking. Dry oral mucosa. No oral thrush. No caroptid bruits. No JVD. Good air entry bilaterally. No rhonchi or rales. S1S2 heard. No murmurs. RRR. Abdomen- soft, non tender. +BS. CNS- grossly intact. Extremities- no pedal edema. Peripheral pulses equal.   Labs on Admission:   [facility] 06/18/11 2059  NA 129*  K 5.0  CL 93*  CO2 22  GLUCOSE 462*  BUN 27*  CREATININE 1.17*  CALCIUM 10.2  MG --  PHOS --   No results found for this basename: AST:2,ALT:2,ALKPHOS:2,BILITOT:2,PROT:2,ALBUMIN:2 in the last 72 hours No results found for this basename: LIPASE:2,AMYLASE:2 in the last 72 hours  Basename 06/18/11 2059  WBC 7.3  NEUTROABS 4.0  HGB 15.3*  HCT 44.9  MCV 83.8  PLT 206   No results found for  this basename: CKTOTAL:3,CKMB:3,CKMBINDEX:3,TROPONINI:3 in the last 72 hours No results found for this basename: TSH,T4TOTAL,FREET3,T3FREE,THYROIDAB in the last 72 hours No results found for this basename: VITAMINB12:2,FOLATE:2,FERRITIN:2,TIBC:2,IRON:2,RETICCTPCT:2 in the last 72 hours  Radiological Exams on Admission: No results found.  Assessment  71 year old diabetic, who presents with nonketotic hyperosmolar hyperglycemia. Last hba1c 7.4 in 2010 per records. She has a uti, which may have triggered the hyperglycemia, as she professes compliance with diabetic meds(I do not see any listed for diabetes, and she is not sure what she is taking for the diabetes, other than the fact that her doctor took her off insulin a few years ago).  Plan  .Uncontrolled type 2 DM with hyperosmolar nonketotic hyperglycemia- admit med/surg. Continue ivfr/glucose stabilizer, check hb a1c. May eventually need to be on insulin. Marland KitchenUTI (lower urinary tract infection)- septic work up, including cxr/UC/BC. Will cover with vanc/cipro as patient sick looking and may be getting septic. ?descalate abx in the next 24-48 hours. Marland KitchenHTN (hypertension), benign- uncontrolled. Resume home meds. .A-fib- rate controlled. Rate sometimes dips into 40's. Check digoxin level. Continue coumadin per pharmacy. [facility] INR 2-3 .Hyperlipidemia- on zocor. Continue. Marland KitchenAKI (acute kidney injury)- seems prerenal. Give ivf. Hold lasix. Expect quick resolution. gi prophylaxis. Condition guarded.  Casey [name] [phone]. 06/18/2011, 11:43 PM

## 2011-06-19 DIAGNOSIS — N179 Acute kidney failure, unspecified: Secondary | ICD-10-CM

## 2011-06-19 DIAGNOSIS — I10 Essential (primary) hypertension: Secondary | ICD-10-CM

## 2011-06-19 DIAGNOSIS — I369 Nonrheumatic tricuspid valve disorder, unspecified: Secondary | ICD-10-CM

## 2011-06-19 DIAGNOSIS — R7989 Other specified abnormal findings of blood chemistry: Secondary | ICD-10-CM

## 2011-06-19 DIAGNOSIS — E782 Mixed hyperlipidemia: Secondary | ICD-10-CM

## 2011-06-19 LAB — CBC
MCHC: 33.6 g/dL (ref 30.0–36.0)
RDW: 14.2 % (ref 11.5–15.5)

## 2011-06-19 LAB — GLUCOSE, CAPILLARY
Glucose-Capillary: 158 mg/dL — ABNORMAL HIGH (ref 70–99)
Glucose-Capillary: 98 mg/dL (ref 70–99)
Glucose-Capillary: 112 mg/dL — ABNORMAL HIGH (ref 70–99)
Glucose-Capillary: 260 mg/dL — ABNORMAL HIGH (ref 70–99)
Glucose-Capillary: 230 mg/dL — ABNORMAL HIGH (ref 70–99)
Glucose-Capillary: 372 mg/dL — ABNORMAL HIGH (ref 70–99)

## 2011-06-19 LAB — HEMOGLOBIN A1C
Hgb A1c MFr Bld: 13.2 % — ABNORMAL HIGH (ref <5.7)
Mean Plasma Glucose: 332 mg/dL — ABNORMAL HIGH (ref <117)

## 2011-06-19 LAB — COMPREHENSIVE METABOLIC PANEL
ALT: 18 U/L (ref 0–35)
Alkaline Phosphatase: 88 U/L (ref 39–117)
CO2: 25 mEq/L (ref 19–32)
GFR calc Af Amer: 67 mL/min — ABNORMAL LOW (ref >90)
GFR calc non Af Amer: 57 mL/min — ABNORMAL LOW (ref >90)
Glucose, Bld: 104 mg/dL — ABNORMAL HIGH (ref 70–99)
Potassium: 3.2 mEq/L — ABNORMAL LOW (ref 3.5–5.1)
Sodium: 140 mEq/L (ref 135–145)
Total Bilirubin: 0.4 mg/dL (ref 0.3–1.2)

## 2011-06-19 LAB — PROTIME-INR
INR: 4.93 — ABNORMAL HIGH (ref 0.00–1.49)
Prothrombin Time: 46.6 seconds — ABNORMAL HIGH (ref 11.6–15.2)

## 2011-06-19 LAB — MAGNESIUM: Magnesium: 1.8 mg/dL (ref 1.5–2.5)

## 2011-06-19 LAB — DIGOXIN LEVEL: Digoxin Level: 1 ng/mL (ref 0.8–2.0)

## 2011-06-19 MED ORDER — ONDANSETRON HCL 4 MG PO TABS: 4.0000 mg | ORAL_TABLET | Freq: Four times a day (QID) | ORAL | Status: DC | PRN

## 2011-06-19 MED ORDER — INSULIN GLARGINE 100 UNIT/ML ~~LOC~~ SOLN
5.0000 [IU] | Freq: Every day | SUBCUTANEOUS | Status: DC
  Administered 2011-06-19: 5 [IU] via SUBCUTANEOUS

## 2011-06-19 MED ORDER — METOPROLOL SUCCINATE ER 50 MG PO TB24
50.0000 mg | ORAL_TABLET | Freq: Two times a day (BID) | ORAL | Status: DC
  Administered 2011-06-19 – 2011-06-21 (×5): 50 mg via ORAL
  Filled 2011-06-19 (×6): qty 1

## 2011-06-19 MED ORDER — DEXTROSE 50 % IV SOLN: 25.0000 mL | Freq: Once | INTRAVENOUS | Status: DC | PRN

## 2011-06-19 MED ORDER — BD GETTING STARTED TAKE HOME KIT: 1/2ML X 30G SYRINGES
1.0000 | Freq: Once | Status: AC
  Administered 2011-06-19: 1
  Filled 2011-06-19: qty 1

## 2011-06-19 MED ORDER — LEVETIRACETAM 250 MG PO TABS
250.0000 mg | ORAL_TABLET | Freq: Two times a day (BID) | ORAL | Status: DC
  Administered 2011-06-19 – 2011-06-21 (×5): 250 mg via ORAL
  Filled 2011-06-19 (×6): qty 1

## 2011-06-19 MED ORDER — WARFARIN SODIUM 5 MG PO TABS: 5.0000 mg | ORAL_TABLET | ORAL | Status: DC

## 2011-06-19 MED ORDER — DIGOXIN 125 MCG PO TABS
125.0000 ug | ORAL_TABLET | Freq: Every day | ORAL | Status: DC
  Administered 2011-06-19 – 2011-06-21 (×3): 125 ug via ORAL
  Filled 2011-06-19 (×3): qty 1

## 2011-06-19 MED ORDER — INSULIN ASPART 100 UNIT/ML ~~LOC~~ SOLN
0.0000 [IU] | Freq: Every day | SUBCUTANEOUS | Status: DC
  Administered 2011-06-19: 5 [IU] via SUBCUTANEOUS

## 2011-06-19 MED ORDER — CIPROFLOXACIN IN D5W 400 MG/200ML IV SOLN
400.0000 mg | Freq: Two times a day (BID) | INTRAVENOUS | Status: DC
  Administered 2011-06-20 (×2): 400 mg via INTRAVENOUS
  Filled 2011-06-19 (×3): qty 200

## 2011-06-19 MED ORDER — INSULIN GLARGINE 100 UNIT/ML ~~LOC~~ SOLN
3.0000 [IU] | Freq: Every day | SUBCUTANEOUS | Status: DC
  Administered 2011-06-19: 3 [IU] via SUBCUTANEOUS

## 2011-06-19 MED ORDER — ALUM & MAG HYDROXIDE-SIMETH 200-200-20 MG/5ML PO SUSP: 30.0000 mL | Freq: Four times a day (QID) | ORAL | Status: DC | PRN

## 2011-06-19 MED ORDER — INSULIN ASPART 100 UNIT/ML ~~LOC~~ SOLN: 0.0000 [IU] | Freq: Three times a day (TID) | SUBCUTANEOUS | Status: DC

## 2011-06-19 MED ORDER — DEXTROSE 5 % IV SOLN: INTRAVENOUS | Status: DC

## 2011-06-19 MED ORDER — VANCOMYCIN HCL IN DEXTROSE 1-5 GM/200ML-% IV SOLN
1000.0000 mg | Freq: Every day | INTRAVENOUS | Status: DC
  Administered 2011-06-19: 1000 mg via INTRAVENOUS
  Filled 2011-06-19 (×2): qty 200

## 2011-06-19 MED ORDER — CIPROFLOXACIN IN D5W 400 MG/200ML IV SOLN
400.0000 mg | Freq: Two times a day (BID) | INTRAVENOUS | Status: DC
  Administered 2011-06-19: 400 mg via INTRAVENOUS
  Filled 2011-06-19 (×2): qty 200

## 2011-06-19 MED ORDER — HYDROCODONE-ACETAMINOPHEN 5-325 MG PO TABS: 1.0000 | ORAL_TABLET | ORAL | Status: DC | PRN

## 2011-06-19 MED ORDER — INSULIN ASPART 100 UNIT/ML ~~LOC~~ SOLN
0.0000 [IU] | Freq: Three times a day (TID) | SUBCUTANEOUS | Status: DC
  Administered 2011-06-19: 15 [IU] via SUBCUTANEOUS
  Administered 2011-06-19: 5 [IU] via SUBCUTANEOUS
  Administered 2011-06-20: 8 [IU] via SUBCUTANEOUS
  Administered 2011-06-20: 15 [IU] via SUBCUTANEOUS
  Administered 2011-06-20: 3 [IU] via SUBCUTANEOUS

## 2011-06-19 MED ORDER — LIVING WELL WITH DIABETES BOOK
Freq: Once | Status: AC
  Administered 2011-06-19: 16:00:00
  Filled 2011-06-19: qty 1

## 2011-06-19 MED ORDER — GLUCERNA SHAKE PO LIQD
237.0000 mL | Freq: Two times a day (BID) | ORAL | Status: DC
  Administered 2011-06-19 – 2011-06-20 (×2): 237 mL via ORAL
  Filled 2011-06-19 (×2): qty 237

## 2011-06-19 MED ORDER — POTASSIUM CHLORIDE CRYS ER 20 MEQ PO TBCR
40.0000 meq | EXTENDED_RELEASE_TABLET | Freq: Once | ORAL | Status: AC
  Administered 2011-06-19: 40 meq via ORAL
  Filled 2011-06-19: qty 2

## 2011-06-19 MED ORDER — DEXTROSE 50 % IV SOLN: 50.0000 mL | Freq: Once | INTRAVENOUS | Status: DC | PRN

## 2011-06-19 MED ORDER — DOCUSATE SODIUM 100 MG PO CAPS
100.0000 mg | ORAL_CAPSULE | Freq: Two times a day (BID) | ORAL | Status: DC
  Administered 2011-06-19 – 2011-06-21 (×5): 100 mg via ORAL
  Filled 2011-06-19 (×6): qty 1

## 2011-06-19 MED ORDER — GABAPENTIN 300 MG PO CAPS
300.0000 mg | ORAL_CAPSULE | Freq: Every day | ORAL | Status: DC
  Administered 2011-06-19 – 2011-06-20 (×2): 300 mg via ORAL
  Filled 2011-06-19 (×3): qty 1

## 2011-06-19 MED ORDER — ZOLPIDEM TARTRATE 5 MG PO TABS: 5.0000 mg | ORAL_TABLET | Freq: Every evening | ORAL | Status: DC | PRN

## 2011-06-19 MED ORDER — SIMVASTATIN 20 MG PO TABS
20.0000 mg | ORAL_TABLET | Freq: Every day | ORAL | Status: DC
  Administered 2011-06-19 – 2011-06-20 (×2): 20 mg via ORAL
  Filled 2011-06-19 (×3): qty 1

## 2011-06-19 MED ORDER — OXYBUTYNIN CHLORIDE 5 MG PO TABS
5.0000 mg | ORAL_TABLET | Freq: Three times a day (TID) | ORAL | Status: DC
  Administered 2011-06-19 – 2011-06-21 (×7): 5 mg via ORAL
  Filled 2011-06-19 (×9): qty 1

## 2011-06-19 MED ORDER — ONDANSETRON HCL 4 MG/2ML IJ SOLN: 4.0000 mg | Freq: Four times a day (QID) | INTRAMUSCULAR | Status: DC | PRN

## 2011-06-19 MED ORDER — ASPIRIN 81 MG PO CHEW
81.0000 mg | CHEWABLE_TABLET | Freq: Every day | ORAL | Status: DC
  Administered 2011-06-19 – 2011-06-21 (×3): 81 mg via ORAL
  Filled 2011-06-19 (×3): qty 1

## 2011-06-19 MED ORDER — ASPIRIN 81 MG PO TABS: 81.0000 mg | ORAL_TABLET | Freq: Every day | ORAL | Status: DC

## 2011-06-19 MED ORDER — INSULIN GLARGINE 100 UNIT/ML ~~LOC~~ SOLN: 15.0000 [IU] | Freq: Every day | SUBCUTANEOUS | Status: DC

## 2011-06-19 MED ORDER — MORPHINE SULFATE 2 MG/ML IJ SOLN: 2.0000 mg | INTRAMUSCULAR | Status: DC | PRN

## 2011-06-19 MED ORDER — GLUCOSE 40 % PO GEL: 1.0000 | Freq: Once | ORAL | Status: DC | PRN

## 2011-06-19 MED ORDER — ACETAMINOPHEN 650 MG RE SUPP: 650.0000 mg | Freq: Four times a day (QID) | RECTAL | Status: DC | PRN

## 2011-06-19 MED ORDER — ALLOPURINOL 100 MG PO TABS
100.0000 mg | ORAL_TABLET | Freq: Every day | ORAL | Status: DC
  Administered 2011-06-19 – 2011-06-21 (×3): 100 mg via ORAL
  Filled 2011-06-19 (×3): qty 1

## 2011-06-19 MED ORDER — AMLODIPINE BESYLATE 5 MG PO TABS
5.0000 mg | ORAL_TABLET | Freq: Every day | ORAL | Status: DC
  Administered 2011-06-19 – 2011-06-21 (×3): 5 mg via ORAL
  Filled 2011-06-19 (×3): qty 1

## 2011-06-19 MED ORDER — ENOXAPARIN SODIUM 40 MG/0.4ML ~~LOC~~ SOLN: 40.0000 mg | SUBCUTANEOUS | Status: DC

## 2011-06-19 MED ORDER — ACETAMINOPHEN 325 MG PO TABS: 650.0000 mg | ORAL_TABLET | Freq: Four times a day (QID) | ORAL | Status: DC | PRN

## 2011-06-19 MED ORDER — WARFARIN - PHYSICIAN DOSING INPATIENT: Freq: Every day | Status: DC

## 2011-06-19 NOTE — Progress Notes (Signed)
INITIAL ADULT NUTRITION ASSESSMENT Date: 06/19/2011   Time: 12:18 PM Reason for Assessment: Nutrition Risk  ASSESSMENT: Female 71 y.o.  Dx: weakness  Hx:  Past Medical History  Diagnosis Date  . Chronic atrial fibrillation   . Stroke   . Hypertension   . Hypercholesterolemia   . Diabetes mellitus     Related Meds:     . sodium chloride   Intravenous STAT  . allopurinol  100 mg Oral Daily  . amLODipine  5 mg Oral Daily  . aspirin  81 mg Oral Daily  . ciprofloxacin  400 mg Intravenous Once  . ciprofloxacin  400 mg Intravenous Q12H  . digoxin  125 mcg Oral Daily  . docusate sodium  100 mg Oral BID  . gabapentin  300 mg Oral QHS  . insulin aspart  0-15 Units Subcutaneous TID WC  . insulin aspart  0-5 Units Subcutaneous QHS  . insulin glargine  5 Units Subcutaneous QHS  . insulin (NOVOLIN-R) infusion   Intravenous To Major  . levETIRAcetam  250 mg Oral Q12H  . metoprolol succinate  50 mg Oral BID  . oxybutynin  5 mg Oral TID  . potassium chloride  40 mEq Oral Once  . simvastatin  20 mg Oral QHS  . sodium chloride  1,000 mL Intravenous Once  . sodium chloride  1,000 mL Intravenous Once  . Warfarin - Physician Dosing Inpatient   Does not apply q1800  . DISCONTD: aspirin  81 mg Oral Daily  . DISCONTD: ciprofloxacin  400 mg Intravenous Q12H  . DISCONTD: enoxaparin  40 mg Subcutaneous Q24H  . DISCONTD: insulin aspart  0-9 Units Subcutaneous TID WC  . DISCONTD: insulin glargine  15 Units Subcutaneous Daily  . DISCONTD: insulin glargine  3 Units Subcutaneous Daily  . DISCONTD: insulin regular  0-10 Units Intravenous TID WC  . DISCONTD: vancomycin  1,000 mg Intravenous QHS  . DISCONTD: warfarin  5 mg Oral UD     Ht: 5\' 5"  (165.1 cm)  Wt: 144 lb 10 oz (65.6 kg)  Ideal Wt: 56.8 kg  % Ideal Wt: 116%  Usual Wt: 156 lbs per pt report Wt Readings from Last 10 Encounters:  06/19/11 144 lb 10 oz (65.6 kg)  12/29/10 153 lb (69.4 kg)  10/16/10 153 lb (69.4 kg)  07/07/10  156 lb (70.761 kg)    % Usual Wt: 92%  Body mass index is 24.07 kg/(m^2). WNL  Food/Nutrition Related Hx: Per nutrition risk assessment pt has had weight loss.   Labs:  CMP     Component Value Date/Time   NA 140 06/19/2011 0515   K 3.2* 06/19/2011 0515   CL 103 06/19/2011 0515   CO2 25 06/19/2011 0515   GLUCOSE 104* 06/19/2011 0515   BUN 18 06/19/2011 0515   CREATININE 0.97 06/19/2011 0515   CALCIUM 10.0 06/19/2011 0515   PROT 6.9 06/19/2011 0515   ALBUMIN 3.4* 06/19/2011 0515   AST 17 06/19/2011 0515   ALT 18 06/19/2011 0515   ALKPHOS 88 06/19/2011 0515   BILITOT 0.4 06/19/2011 0515   GFRNONAA 57* 06/19/2011 0515   GFRAA 67* 06/19/2011 0515   CBG (last 3)   Basename 06/19/11 1211 06/19/11 1004 06/19/11 0847  GLUCAP 353* 260* 147*    Lab Results  Component Value Date   HGBA1C  Value: 7.4 (NOTE) The ADA recommends the following therapeutic goal for glycemic control related to Hgb A1c measurement: Goal of therapy: <6.5 Hgb A1c  Reference: American Diabetes Association:  Clinical Practice Recommendations 2010, Diabetes Care, 2010, 33: (Suppl  1).* 12/08/2008      Intake/Output Summary (Last 24 hours) at 06/19/11 1220 Last data filed at 06/19/11 0900  Gross per 24 hour  Intake   2375 ml  Output      0 ml  Net   2375 ml     Diet Order: Carb Control  Supplements/Tube Feeding: none  IVF:    DISCONTD: sodium chloride   DISCONTD: sodium chloride Last Rate: Stopped (06/19/11 0044)  DISCONTD: dextrose   DISCONTD: dextrose   DISCONTD: dextrose 5 % and 0.45% NaCl Last Rate: 50 mL/hr at 06/19/11 0420  DISCONTD: insulin (NOVOLIN-R) infusion Last Rate: 4.2 Units/hr (06/19/11 0044)    Estimated Nutritional Needs:   Kcal: 1550-1700 Protein: 70-80 gm  Fluid:  1.6- 1.9 L  Per MD notes pt reports a 25 lb weight loss in 6 months. Per pt weight hx, pt has had about 9 lb weight loss (5.8%) not significant in 6 months. Pt has had increased blood sugars over the last few months despite  being compliant with her diet and medications per her report. ? If uncontrolled sugars playing a roll in recent weight loss.  RD spoke with pt who stated she was eating her normal amount and losing weight. Willing to drink Glucerna at this admission.    NUTRITION DIAGNOSIS: Unintentional weight loss  Status: Ongoing  RELATED TO: etiology unknown  AS EVIDENCE BY: weight loss despite normal intake  MONITORING/EVALUATION(Goals): Goal: PO intake to maintain weight  Monitor: PO intake, weight, labs, I/O's  EDUCATION NEEDS: -No education needs identified at this time  INTERVENTION: 1. Recommend obtaining more recent A1c.  2. Will add Glucerna shake BID 3. RD will continue to follow   Dietitian [phone]  DOCUMENTATION CODES Per approved criteria  -Not Applicable    Clarene Duke MARIE 06/19/2011, 12:18 PM

## 2011-06-19 NOTE — Evaluation (Signed)
Physical Therapy Evaluation Patient Details Name: Casey [name] MRN: [id number] DOB: Dec 25, 1940 Today's Date: 06/19/2011 Time: 4098-1191 PT Time Calculation (min): 14 min  PT Assessment / Plan / Recommendation Clinical Impression  Pt adm with hyperglycemia.  Pt very weak and unable to maintain standing. Needs skilled PT to maximize I and safety so pt can eventually live in least restrictive environment.  Lived alone and will need ST-SNF.    PT Assessment  Patient needs continued PT services    Follow Up Recommendations  Skilled nursing facility    Barriers to Discharge        lEquipment Recommendations  Defer to next venue    Recommendations for Other Services OT consult   Frequency Min 3X/week    Precautions / Restrictions Precautions Precautions: Fall Restrictions Weight Bearing Restrictions: No   Pertinent Vitals/Pain N/A      Mobility  Bed Mobility Bed Mobility: Supine to Sit;Sitting - Scoot to Edge of Bed Supine to Sit: 4: Min assist;HOB flat Sitting - Scoot to [place] of Bed: 3: Mod assist Details for Bed Mobility Assistance: Pt hesitant to use lt hand for mobility due to IV  Transfers Transfers: Sit to Stand;Stand to Sit;Stand Pivot Transfers Sit to Stand: 1: +2 Total assist;From bed Sit to Stand: Patient Percentage: 40% Stand to Sit: 1: +2 Total assist;To bed;To chair/3-in-1;With upper extremity assist Stand to Sit: Patient Percentage: 40% Stand Pivot Transfers: 1: +2 Total assist Stand Pivot Transfers: Patient Percentage: 40% Details for Transfer Assistance: Pt with knees buckling in standing.  Transferred from bed to chair.    Exercises     PT Diagnosis: Difficulty walking;Generalized weakness  PT Problem List: Decreased strength;Decreased activity tolerance;Decreased mobility;Decreased balance PT Treatment Interventions: Functional mobility training;Gait training;DME instruction;Therapeutic activities;Therapeutic exercise;Balance  training;Patient/family education   PT Goals Acute Rehab PT Goals PT Goal Formulation: With patient Time For Goal Achievement: 07/03/11 Potential to Achieve Goals: Good Pt will go Supine/Side to Sit: with modified independence PT Goal: Supine/Side to Sit - Progress: Goal set today Pt will go Sit to Supine/Side: with modified independence PT Goal: Sit to Supine/Side - Progress: Goal set today Pt will go Sit to Stand: with min assist PT Goal: Sit to Stand - Progress: Goal set today Pt will go Stand to Sit: with min assist PT Goal: Stand to Sit - Progress: Goal set today Pt will Ambulate: 16 - 50 feet;with min assist;with least restrictive assistive device PT Goal: Ambulate - Progress: Goal set today  Visit Information  Last PT Received On: 06/19/11 Assistance Needed: +2    Subjective Data  Subjective: "They woke me up every hour last night." Patient Stated Goal: Return home   Prior Functioning  Home Living Lives With: Alone (Independent living vs ALF) Type of Home: Apartment Home Access: Level entry Home Layout: One level Home Adaptive Equipment: Straight cane Prior Function Level of Independence: Independent with assistive device(s) (amb with cane) Vocation: Retired Musician: No difficulties    Cognition  Overall Cognitive Status: Appears within functional limits for tasks assessed/performed Arousal/Alertness: Awake/alert Orientation Level: Appears intact for tasks assessed Behavior During Session: [facility] for tasks performed    Extremity/Trunk Assessment Right Lower Extremity Assessment RLE ROM/Strength/Tone: Deficits RLE ROM/Strength/Tone Deficits: test 3+/5 but functionally is less than 3 Left Lower Extremity Assessment LLE ROM/Strength/Tone: Deficits LLE ROM/Strength/Tone Deficits: test 3+/5 but functionally is less than 3   Balance Static Sitting Balance Static Sitting - Balance Support: Bilateral upper extremity supported Static Sitting - Level  of Assistance:  5: Stand by assistance  End of Session PT - End of Session Equipment Utilized During Treatment: Gait belt Activity Tolerance: Patient limited by fatigue Patient left: in chair;with call bell/phone within reach Nurse Communication: Mobility status   Casey [name] 06/19/2011, 10:08 AM  Casey [name] PT [phone]

## 2011-06-19 NOTE — Progress Notes (Addendum)
Inpatient Diabetes Program Recommendations  AACE/ADA: New Consensus Statement on Inpatient Glycemic Control (2009)  Target Ranges:  Prepandial:   less than 140 mg/dL      Peak postprandial:   less than 180 mg/dL (1-2 hours)      Critically ill patients:  140 - 180 mg/dL   Reason for Visit: Results for Casey [name], Casey [name] (MRN [id number]) as of 06/19/2011 15:06  Ref. Range 06/19/2011 08:47 06/19/2011 10:04 06/19/2011 12:11  Glucose-Capillary Latest Range: 70-99 mg/dL 409 (H) 811 (H) 914 (H)    Inpatient Diabetes Program Recommendations Insulin - Basal: Increase Lantus to 12 units daily. Insulin - Meal Coverage: Add Novolog meal coverage 4 units tid with meals. HgbA1C: A1C pending.  Note: Will follow.     Addendum:  Spoke briefly to RN.  She states patient is very weak and may need short-term placement.  Will order diabetes education material for family and patient if appropriate.

## 2011-06-19 NOTE — Progress Notes (Signed)
Addendum  Patient seen and examined, chart and data base reviewed.  I agree with the above assessment and plan  For full details please see Mrs. Algis Downs PA. Note.  UTI, known diabetic and history of atrial fibrillation. Has unintentional weight loss too.   Clint Lipps Pager: [phone] 06/19/2011, 2:17 PM

## 2011-06-19 NOTE — Progress Notes (Signed)
Patient ID: Casey [name], female   DOB: 15-Nov-1940, 71 y.o.   MRN: [id number]  PATIENT DETAILS Name: Casey [name] Age: 71 y.o. Sex: female Date of Birth: 1940-08-06 Admit Date: 06/18/2011 PCP:  Cassell Clement  Subjective: Patient appears extremely weak.  Not a good historian.  Received some history from grand daughter.  Patient has lost 25 lbs in 6 months, but has an appetite and eats.  Has grown progressively weak over 6 months - gets out of the recliner as little as possible.  Can't remember the names of her medications at home.  She requests that I call Dr. Patty Sermons to let him know she is sick.  Objective: Weight change:   Intake/Output Summary (Last 24 hours) at 06/19/11 1133 Last data filed at 06/19/11 0900  Gross per 24 hour  Intake   2375 ml  Output      0 ml  Net   2375 ml   Blood pressure 156/66, pulse 66, temperature 98 F (36.7 C), temperature source Oral, resp. rate 18, height 5\' 5"  (1.651 m), weight 65.6 kg (144 lb 10 oz), SpO2 98.00%. Filed Vitals:   06/19/11 0100 06/19/11 0510 06/19/11 0535 06/19/11 0833  BP: 174/71  123/60 156/66  Pulse: 51  66   Temp: 97.6 F (36.4 C)  98 F (36.7 C)   TempSrc: Oral  Oral   Resp: 18  18   Height:      Weight:  65.6 kg (144 lb 10 oz)    SpO2: 100%  98%     Physical Exam: General: No acute distress, appears frail, fatigued, chronically ill. Lungs: Clear to auscultation bilaterally without wheezes or crackles Cardiovascular: Irregular rate and rhythm without murmur gallop or rub normal S1 and S2 Abdomen: Nontender, nondistended, soft, bowel sounds positive, no rebound, no ascites, no appreciable mass Extremities: No significant cyanosis, clubbing, or edema bilateral lower extremities Neuro:  Cranial Nerves 2 - 12 grossly intact.  Non focal.  Basic Metabolic Panel:  Lab 06/19/11 7829 06/18/11 2059  NA 140 129*  K 3.2* 5.0  CL 103 93*  CO2 25 22  GLUCOSE 104* 462*  BUN 18 27*  CREATININE 0.97 1.17*    CALCIUM 10.0 10.2  MG 1.8 --  PHOS 2.0* --   Liver Function Tests:  Lab 06/19/11 0515  AST 17  ALT 18  ALKPHOS 88  BILITOT 0.4  PROT 6.9  ALBUMIN 3.4*   CBC:  Lab 06/19/11 0515 06/18/11 2059  WBC 6.0 7.3  NEUTROABS -- 4.0  HGB 13.6 15.3*  HCT 40.5 44.9  MCV 84.2 83.8  PLT 203 206   CBG:  Lab 06/19/11 1004 06/19/11 0847 06/19/11 0728 06/19/11 0623 06/19/11 0517 06/19/11 0412  GLUCAP 260* 147* 112* 98 119* 158*   Coagulation:  Lab 06/19/11 0515 06/18/11 2313  LABPROT 46.6* 42.2*  INR 4.93* 4.34*    Studies/Results: Scheduled Meds:    . sodium chloride   Intravenous STAT  . allopurinol  100 mg Oral Daily  . amLODipine  5 mg Oral Daily  . aspirin  81 mg Oral Daily  . ciprofloxacin  400 mg Intravenous Once  . ciprofloxacin  400 mg Intravenous Q12H  . digoxin  125 mcg Oral Daily  . docusate sodium  100 mg Oral BID  . gabapentin  300 mg Oral QHS  . insulin aspart  0-15 Units Subcutaneous TID WC  . insulin aspart  0-5 Units Subcutaneous QHS  . insulin glargine  5 Units Subcutaneous QHS  .  insulin (NOVOLIN-R) infusion   Intravenous To Major  . levETIRAcetam  250 mg Oral Q12H  . metoprolol succinate  50 mg Oral BID  . oxybutynin  5 mg Oral TID  . potassium chloride  40 mEq Oral Once  . simvastatin  20 mg Oral QHS  . sodium chloride  1,000 mL Intravenous Once  . sodium chloride  1,000 mL Intravenous Once  . Warfarin - Physician Dosing Inpatient   Does not apply q1800  . DISCONTD: aspirin  81 mg Oral Daily  . DISCONTD: ciprofloxacin  400 mg Intravenous Q12H  . DISCONTD: enoxaparin  40 mg Subcutaneous Q24H  . DISCONTD: insulin aspart  0-9 Units Subcutaneous TID WC  . DISCONTD: insulin glargine  15 Units Subcutaneous Daily  . DISCONTD: insulin glargine  3 Units Subcutaneous Daily  . DISCONTD: insulin regular  0-10 Units Intravenous TID WC  . DISCONTD: vancomycin  1,000 mg Intravenous QHS  . DISCONTD: warfarin  5 mg Oral UD   Continuous Infusions:    .  DISCONTD: sodium chloride    . DISCONTD: sodium chloride Stopped (06/19/11 0044)  . DISCONTD: dextrose    . DISCONTD: dextrose    . DISCONTD: dextrose 5 % and 0.45% NaCl 50 mL/hr at 06/19/11 0420  . DISCONTD: insulin (NOVOLIN-R) infusion 4.2 Units/hr (06/19/11 0044)   PRN Meds:.acetaminophen, acetaminophen, alum & mag hydroxide-simeth, ondansetron (ZOFRAN) IV, ondansetron, zolpidem, DISCONTD: dextrose, DISCONTD: dextrose, DISCONTD: dextrose, DISCONTD: dextrose, DISCONTD: dextrose, DISCONTD: dextrose, DISCONTD: HYDROcodone-acetaminophen, DISCONTD:  morphine injection  Anti-infectives:  Anti-infectives     Start     Dose/Rate Route Frequency Ordered Stop   06/19/11 1200   ciprofloxacin (CIPRO) IVPB 400 mg        400 mg 200 mL/hr over 60 Minutes Intravenous Every 12 hours 06/19/11 1100     06/19/11 1000   ciprofloxacin (CIPRO) IVPB 400 mg  Status:  Discontinued        400 mg 200 mL/hr over 60 Minutes Intravenous Every 12 hours 06/19/11 0031 06/19/11 1100   06/19/11 0130   vancomycin (VANCOCIN) IVPB 1000 mg/200 mL premix  Status:  Discontinued        1,000 mg 200 mL/hr over 60 Minutes Intravenous Daily at bedtime 06/19/11 0031 06/19/11 1100   06/18/11 2230   ciprofloxacin (CIPRO) IVPB 400 mg        400 mg 200 mL/hr over 60 Minutes Intravenous  Once 06/18/11 2219 06/18/11 2325          Assessment/Plan: Active Problems:  Uncontrolled type 2 DM with hyperosmolar nonketotic hyperglycemia  UTI (lower urinary tract infection)  HTN (hypertension), benign  A-fib  Current use of long term anticoagulation  Hyperlipidemia  AKI (acute kidney injury)    Urinary Tract Infection -started on Vanc and Cipro.  Will narrow to Cipro.  Patient has an allergy to PCN.  Urine Culture is pending.  Uncontrolled DM II - patient initially placed on glucose stabilizer, now on Lantus and SSI Moderate. - Does not appear to have been on any medications at home for DM.  CBGs likely worse due to acute  infection.  -Will request DM Coordinator Consultation.  Unintentional Weight Loss, failure to thrive -Estimated 25 lbs in 6 months, despite eating -Patient denies pain -Will request nutrition consult. -Physical Therapy working with patient. -If patient's clinically appearance does not improve with resolution of UTI would consider CT scan to r/o malignancy.  Atrial Fibrillation / history of CHF -Patient with symptoms of decreased energy / increased SOB will  re-check Echo (last done in 2010). -noted to be occasionally bradycardic in the 40s.  Supratherapeutic INR -Coumadin per Pharmacy  HTN (hypertension) - Benign- uncontrolled. Resume home meds.  Hyperlipidemia On zocor. Continue.  AKI (acute kidney injury) -Resolved with IVF.  Will continue to hold lasix for now.  Abnormal CXR -will review results with Dr. Arthor Captain.  Not currently on antibiotic therapy for PNA.  DVT Prophylaxis:  -INR supra-therapeutic  Disposition - Patient lives at home alone.  Will likely go to grand-daughter's house at discharge.   LOS: 1 day  Stephani Police 06/19/2011, 11:33 AM [phone]

## 2011-06-19 NOTE — Progress Notes (Signed)
ANTIBIOTIC and ANTICOAGULATION CONSULT NOTE - INITIAL  Pharmacy Consult for vancomycin, ciprofloxacin, and warfarin Indication: Empiric, h/o atrial fibrillation  Allergies  Allergen Reactions  . Penicillins     Patient Measurements:   Ht 5'5" Wt 150 lb per RN.   Vital Signs: Temp: 97.5 F (36.4 C) (05/20 2343) Temp src: Oral (05/20 2343) BP: 186/70 mmHg (05/20 2343) Pulse Rate: 57  (05/20 2230) Intake/Output from previous day: 05/20 0701 - 05/21 0700 In: 1250 [I.V.:1250] Out: -  Intake/Output from this shift: Total I/O In: 1250 [I.V.:1250] Out: -   Labs:  Basename 06/18/11 2059  WBC 7.3  HGB 15.3*  PLT 206  LABCREA --  CREATININE 1.17*   Basename 06/18/11 2313 06/18/11 2059  HGB -- 15.3*  HCT -- 44.9  PLT -- 206  APTT 54* --  LABPROT 42.2* --  INR 4.34* --  HEPARINUNFRC -- --  CREATININE -- 1.17*  CKTOTAL -- --  CKMB -- --  TROPONINI -- --    The CrCl is unknown because both a height and weight (above a minimum accepted value) are required for this calculation. No results found for this basename: VANCOTROUGH:2,VANCOPEAK:2,VANCORANDOM:2,GENTTROUGH:2,GENTPEAK:2,GENTRANDOM:2,TOBRATROUGH:2,TOBRAPEAK:2,TOBRARND:2,AMIKACINPEAK:2,AMIKACINTROU:2,AMIKACIN:2, in the last 72 hours   Microbiology: No results found for this or any previous visit (from the past 720 hour(s)).  Medical History: Past Medical History  Diagnosis Date  . Chronic atrial fibrillation   . Stroke   . Hypertension   . Hypercholesterolemia   . Diabetes mellitus     Medications:  Scheduled:    . sodium chloride   Intravenous STAT  . ciprofloxacin  400 mg Intravenous Once  . insulin (NOVOLIN-R) infusion   Intravenous To Major  . insulin regular  0-10 Units Intravenous TID WC  . sodium chloride  1,000 mL Intravenous Once  . sodium chloride  1,000 mL Intravenous Once   Assessment: 71 yo female with h/o atrial fibrillation on Coumadin PTA. Pharmacy consulted to manage vancomycin and  ciprofloxacin for empiric therapy. INR (4.24) is above-goal.   Goal of Therapy:  INR 2-3 Monitor platelets by anticoagulation protocol: Yes Vancomycin trough 10-20 mcg/mL    Plan:  1. Hold Coumadin.  2. Daily PT / INR.  3. Cipro 400mg  IV Q12H. 4. Vancomycin  1gm IV Q24H.  Emeline Gins 06/19/2011,12:22 AM

## 2011-06-19 NOTE — Plan of Care (Signed)
Problem: Phase I Progression Outcomes Goal: Initial discharge plan identified Outcome: Completed/Met Date Met:  06/19/11 Needs ST rehab

## 2011-06-19 NOTE — ED Notes (Signed)
Patient had episode of incontinence, patient cleaned and linens changed.

## 2011-06-19 NOTE — Progress Notes (Signed)
PT Treatment Note   06/19/11 1112  PT Visit Information  Last PT Received On 06/19/11  Assistance Needed +2  PT Time Calculation  PT Start Time 1043  PT Stop Time 1055  PT Time Calculation (min) 12 min  Subjective Data  Subjective "I need to use the bathroom."  Precautions  Precautions Fall  Cognition  Overall Cognitive Status Appears within functional limits for tasks assessed/performed  Arousal/Alertness Awake/alert  Orientation Level Appears intact for tasks assessed  Behavior During Session [facility] for tasks performed  Bed Mobility  Bed Mobility Sit to Supine  Sit to Supine 3: Mod assist  Details for Bed Mobility Assistance Assist to bring legs up and lower trunk in controlled motion.  Transfers  Sit to Stand 1: +2 Total assist;From chair/3-in-1  Sit to Stand: Patient Percentage 50%  Stand to Sit 1: +2 Total assist;To bed;To chair/3-in-1;With upper extremity assist  Stand to Sit: Patient Percentage 50%  Stand Pivot Transfers 1: +2 Total assist  Stand Pivot Transfers: Patient Percentage 50%  Details for Transfer Assistance Pt with knees buckling in standing.  Transferred from bed to chair.  Static Sitting Balance  Static Sitting - Balance Support Bilateral upper extremity supported  Static Sitting - Level of Assistance 5: Stand by assistance  PT - End of Session  Equipment Utilized During Treatment Gait belt  Activity Tolerance Patient limited by fatigue  Patient left in bed;with call bell/phone within reach;with nursing in room;with family/visitor present  Nurse Communication Mobility status  PT - Assessment/Plan  PT Plan Discharge plan remains appropriate;Frequency remains appropriate  PT Frequency Min 3X/week  Recommendations for Other Services OT consult  Follow Up Recommendations Skilled nursing facility  Equipment Recommended Defer to next venue  Acute Rehab PT Goals  PT Goal: Supine/Side to Sit - Progress Progressing toward goal  PT Goal: Sit to Supine/Side -  Progress Progressing toward goal  PT Goal: Sit to Stand - Progress Progressing toward goal  PT Goal: Stand to Sit - Progress Progressing toward goal    [facility] PT [phone]

## 2011-06-19 NOTE — Progress Notes (Signed)
Pt admitted to 5503 on GlucoStabilizer. Skin intact.Pt is alert and oriented but vision is very blurry. Very unstable on her feet, complaining of some dizziness. Oriented to room and unit.  Idalia Needle RN

## 2011-06-19 NOTE — Progress Notes (Signed)
ANTICOAGULATION CONSULT NOTE - Follow Up Consult  Pharmacy Consult for warfarin Indication: atrial fibrillation  Vital Signs: Temp: 98 F (36.7 C) (05/21 0535) Temp src: Oral (05/21 0535) BP: 123/60 mmHg (05/21 0535) Pulse Rate: 66  (05/21 0535)  Labs:  Basename 06/19/11 0515 06/18/11 2313 06/18/11 2059  HGB 13.6 -- 15.3*  HCT 40.5 -- 44.9  PLT 203 -- 206  APTT 53* 54* --  LABPROT 46.6* 42.2* --  INR 4.93* 4.34* --  HEPARINUNFRC -- -- --  CREATININE 0.97 -- 1.17*  CKTOTAL -- -- --  CKMB -- -- --  TROPONINI -- -- --    Estimated Creatinine Clearance: 47.9 ml/min (by C-G formula based on Cr of 0.97).  Assessment: 71 yof on chronic coumadin for history of atrial fibrillation. Today INR remains elevated at 4.93. CBC is WNL and there is not documented bleeding.   Goal of Therapy:  INR 2-3   Plan:  1. No coumadin today 2. F/u AM INR  Casey [name], Drake Leach 06/19/2011,8:39 AM

## 2011-06-19 NOTE — ED Notes (Signed)
Called [phone], RN report given via phone.

## 2011-06-20 DIAGNOSIS — N179 Acute kidney failure, unspecified: Secondary | ICD-10-CM

## 2011-06-20 DIAGNOSIS — I10 Essential (primary) hypertension: Secondary | ICD-10-CM

## 2011-06-20 DIAGNOSIS — R7989 Other specified abnormal findings of blood chemistry: Secondary | ICD-10-CM

## 2011-06-20 DIAGNOSIS — E782 Mixed hyperlipidemia: Secondary | ICD-10-CM

## 2011-06-20 LAB — GLUCOSE, CAPILLARY
Glucose-Capillary: 436 mg/dL — ABNORMAL HIGH (ref 70–99)
Glucose-Capillary: 193 mg/dL — ABNORMAL HIGH (ref 70–99)
Glucose-Capillary: 119 mg/dL — ABNORMAL HIGH (ref 70–99)

## 2011-06-20 LAB — PROTIME-INR: INR: 3.51 — ABNORMAL HIGH (ref 0.00–1.49)

## 2011-06-20 LAB — BASIC METABOLIC PANEL
CO2: 22 mEq/L (ref 19–32)
Calcium: 9.5 mg/dL (ref 8.4–10.5)
Chloride: 105 mEq/L (ref 96–112)
Potassium: 4.2 mEq/L (ref 3.5–5.1)
Sodium: 137 mEq/L (ref 135–145)

## 2011-06-20 LAB — PRO B NATRIURETIC PEPTIDE: Pro B Natriuretic peptide (BNP): 1846 pg/mL — ABNORMAL HIGH (ref 0–125)

## 2011-06-20 LAB — URINE CULTURE
Colony Count: >=100000
Culture  Setup Time: 201305210149

## 2011-06-20 MED ORDER — INSULIN GLARGINE 100 UNIT/ML ~~LOC~~ SOLN: 10.0000 [IU] | Freq: Every day | SUBCUTANEOUS | Status: DC

## 2011-06-20 MED ORDER — METFORMIN HCL 500 MG PO TABS
500.0000 mg | ORAL_TABLET | Freq: Two times a day (BID) | ORAL | Status: DC
  Administered 2011-06-20 – 2011-06-21 (×2): 500 mg via ORAL
  Filled 2011-06-20 (×4): qty 1

## 2011-06-20 MED ORDER — INSULIN ASPART 100 UNIT/ML ~~LOC~~ SOLN
3.0000 [IU] | Freq: Three times a day (TID) | SUBCUTANEOUS | Status: DC
  Administered 2011-06-20: 3 [IU] via SUBCUTANEOUS

## 2011-06-20 MED ORDER — INSULIN ASPART 100 UNIT/ML ~~LOC~~ SOLN
4.0000 [IU] | Freq: Three times a day (TID) | SUBCUTANEOUS | Status: DC
  Administered 2011-06-20 – 2011-06-21 (×2): 4 [IU] via SUBCUTANEOUS

## 2011-06-20 MED ORDER — INSULIN ASPART 100 UNIT/ML ~~LOC~~ SOLN
15.0000 [IU] | Freq: Once | SUBCUTANEOUS | Status: AC
  Administered 2011-06-20: 15 [IU] via SUBCUTANEOUS

## 2011-06-20 MED ORDER — LEVOFLOXACIN 500 MG PO TABS
500.0000 mg | ORAL_TABLET | Freq: Every day | ORAL | Status: DC
  Administered 2011-06-20: 500 mg via ORAL
  Filled 2011-06-20 (×2): qty 1

## 2011-06-20 MED ORDER — INSULIN GLARGINE 100 UNIT/ML ~~LOC~~ SOLN
12.0000 [IU] | Freq: Every day | SUBCUTANEOUS | Status: DC
  Administered 2011-06-20: 12 [IU] via SUBCUTANEOUS

## 2011-06-20 NOTE — Progress Notes (Signed)
Inpatient Diabetes Program Recommendations  AACE/ADA: New Consensus Statement on Inpatient Glycemic Control (2009)  Target Ranges:  Prepandial:   less than 140 mg/dL      Peak postprandial:   less than 180 mg/dL (1-2 hours)      Critically ill patients:  140 - 180 mg/dL   Reason for Visit: Results for Casey [name], Casey [name] (MRN [id number]) as of 06/20/2011 14:11  Ref. Range 06/19/2011 16:57 06/19/2011 21:40 06/20/2011 05:15 06/20/2011 07:57 06/20/2011 12:26  Glucose-Capillary Latest Range: 70-99 mg/dL 409 (H) 811 (H)  914 (H) 496 (H)   Insulin further increased today.  Spoke to patient.  Note that patient will be discharged home instead of to rehab. Patient states that she has been on insulin in the past, but it was discontinued by MD.  She states she knows how to administer insulin.  Asked RN to allow patient to draw up and give own insulin to assess her ability to do so.  Unsure of patient's support at home.  Note that this insulin regimen will require 4 shots a day.  Patient currently does not have a meter, so therefore will need a new one.  Agree with Home health referral. Will need further education prior to discharge.

## 2011-06-20 NOTE — Care Management Note (Signed)
    Page 1 of 2   06/21/2011     11:48:54 AM   CARE MANAGEMENT NOTE 06/21/2011  Patient:  Casey [name], Casey [name]   Account Number:  [id number]  Date Initiated:  06/20/2011  Documentation initiated by:  Letha Cape  Subjective/Objective Assessment:   dx dm, hyperosmolar nonketotic hyperglycemia  admit- lives alone in independent living.     Action/Plan:   pt eval- rec hhpt   Anticipated DC Date:  06/21/2011   Anticipated DC Plan:  HOME W HOME HEALTH SERVICES      DC Planning Services  CM consult      [facility] Choice  HOME HEALTH   Choice offered to / List presented to:  C-1 Patient        HH arranged  HH-1 RN  HH-2 PT      [facility] agency  [facility]   Status of service:  Completed, signed off Medicare Important Message given?   (If response is "NO", the following Medicare IM given date fields will be blank) Date Medicare IM given:   Date Additional Medicare IM given:    Discharge Disposition:  HOME W HOME HEALTH SERVICES  Per UR Regulation:  Reviewed for med. necessity/level of care/duration of stay  If discussed at Long Length of Stay Meetings, dates discussed:    Comments:  PCP Dr. Patty Sermons  06/21/11 11:47 Letha Cape RN, BSN [phone] patient dc to  home with hh servicesx.  [facility] notified.  06/20/11 15:08 Letha Cape RN, BSN [phone] patient lives alone in independent living.  Patient has medication coverage and transportation.  Per physical therapy recs hhpt.  Patient states she has worked with [facility] before and would like to work with them again, referral made to [facility]  for HHRN/PT.  Lupita Leash notified.  Patient for possible dc tomorrow.  Soc will begin 24-48 hrs post discharge.   NCM will continue to follow for dc needs. Patient uses [facility] for MD appts, patient has pt/inr appt with [facility] on 5/24 at 10:45, [facility] will pick her up between 9:45 and 10:15 on 5/24.

## 2011-06-20 NOTE — Progress Notes (Signed)
PATIENT DETAILS Name: Casey [name] Age: 71 y.o. Sex: female Date of Birth: 09/19/1940 Admit Date: 06/18/2011 ZOX:WRUEAV,WUJWJ D, MD, MD  Subjective: Anxious to be discharged home  Objective: Vital signs in last 24 hours: Filed Vitals:   06/19/11 1327 06/19/11 2124 06/20/11 0426 06/20/11 1334  BP: 98/65 129/68 153/85 114/76  Pulse: 68 65 56 52  Temp: 97.9 F (36.6 C) 98.3 F (36.8 C) 97.8 F (36.6 C) 97.6 F (36.4 C)  TempSrc: Oral Oral Oral Oral  Resp: 18 18 16 18   Height:      Weight:   65.9 kg (145 lb 4.5 oz)   SpO2: 97% 96% 100% 99%    Weight change: 2.396 kg (5 lb 4.5 oz)  Body mass index is 24.18 kg/(m^2).  Intake/Output from previous day:  Intake/Output Summary (Last 24 hours) at 06/20/11 1340 Last data filed at 06/20/11 0900  Gross per 24 hour  Intake    558 ml  Output      0 ml  Net    558 ml    PHYSICAL EXAM: Gen Exam: Awake and alert with clear speech.   Neck: Supple, No JVD.   Chest: B/L Clear.   CVS: S1 S2 Regular, no murmurs.  Abdomen: soft, BS +, non tender, non distended.  Extremities: no edema, lower extremities warm to touch. Neurologic: Non Focal.   Skin: No Rash.   Wounds: N/A.    CONSULTS:  None  LAB RESULTS: CBC  Lab 06/19/11 0515 06/18/11 2059  WBC 6.0 7.3  HGB 13.6 15.3*  HCT 40.5 44.9  PLT 203 206  MCV 84.2 83.8  MCH 28.3 28.5  MCHC 33.6 34.1  RDW 14.2 14.3  LYMPHSABS -- 2.4  MONOABS -- 0.7  EOSABS -- 0.2  BASOSABS -- 0.1  BANDABS -- --    Chemistries   Lab 06/20/11 0515 06/19/11 0515 06/18/11 2059  NA 137 140 129*  K 4.2 3.2* 5.0  CL 105 103 93*  CO2 22 25 22   GLUCOSE 273* 104* 462*  BUN 14 18 27*  CREATININE 0.96 0.97 1.17*  CALCIUM 9.5 10.0 10.2  MG -- 1.8 --    GFR Estimated Creatinine Clearance: 48.4 ml/min (by C-G formula based on Cr of 0.96).  Coagulation profile  Lab 06/20/11 0515 06/19/11 0515 06/18/11 2313  INR 3.51* 4.93* 4.34*  PROTIME -- -- --    Cardiac Enzymes No results  found for this basename: CK:3,CKMB:3,TROPONINI:3,MYOGLOBIN:3 in the last 168 hours  No components found with this basename: POCBNP:3 No results found for this basename: DDIMER:2 in the last 72 hours  Basename 06/19/11 1058 06/19/11 0515  HGBA1C 13.2* 12.1*   No results found for this basename: CHOL:2,HDL:2,LDLCALC:2,TRIG:2,CHOLHDL:2,LDLDIRECT:2 in the last 72 hours  Basename 06/19/11 0515  TSH 3.624  T4TOTAL --  T3FREE --  THYROIDAB --   No results found for this basename: VITAMINB12:2,FOLATE:2,FERRITIN:2,TIBC:2,IRON:2,RETICCTPCT:2 in the last 72 hours No results found for this basename: LIPASE:2,AMYLASE:2 in the last 72 hours  Urine Studies No results found for this basename: UACOL:2,UAPR:2,USPG:2,UPH:2,UTP:2,UGL:2,UKET:2,UBIL:2,UHGB:2,UNIT:2,UROB:2,ULEU:2,UEPI:2,UWBC:2,URBC:2,UBAC:2,CAST:2,CRYS:2,UCOM:2,BILUA:2 in the last 72 hours  MICROBIOLOGY: Recent Results (from the past 240 hour(s))  URINE CULTURE     Status: Normal   Collection Time   06/18/11  9:27 PM      Component Value Range Status Comment   Specimen Description URINE, CLEAN CATCH   Final    Special Requests NONE   Final    Culture  Setup Time [id number]   Final    Colony Count >=100,000 COLONIES/ML  Final    Culture ESCHERICHIA COLI   Final    Report Status 06/20/2011 FINAL   Final    Organism ID, Bacteria ESCHERICHIA COLI   Final   CULTURE, BLOOD (ROUTINE X 2)     Status: Normal (Preliminary result)   Collection Time   06/18/11 11:30 PM      Component Value Range Status Comment   Specimen Description BLOOD RIGHT ARM   Final    Special Requests BOTTLES DRAWN AEROBIC AND ANAEROBIC 10CC EACH   Final    Culture  Setup Time [id number]   Final    Culture     Final    Value:        BLOOD CULTURE RECEIVED NO GROWTH TO DATE CULTURE WILL BE HELD FOR 5 DAYS BEFORE ISSUING A FINAL NEGATIVE REPORT   Report Status PENDING   Incomplete   CULTURE, BLOOD (ROUTINE X 2)     Status: Normal (Preliminary result)   Collection  Time   06/18/11 11:45 PM      Component Value Range Status Comment   Specimen Description BLOOD RIGHT HAND   Final    Special Requests BOTTLES DRAWN AEROBIC ONLY 4CC   Final    Culture  Setup Time [id number]   Final    Culture     Final    Value:        BLOOD CULTURE RECEIVED NO GROWTH TO DATE CULTURE WILL BE HELD FOR 5 DAYS BEFORE ISSUING A FINAL NEGATIVE REPORT   Report Status PENDING   Incomplete     RADIOLOGY STUDIES/RESULTS: Dg Chest Port 1 View  06/18/2011  *RADIOLOGY REPORT*  Clinical Data: Fever.  Chills.  Hyperglycemia.  PORTABLE CHEST - 1 VIEW 06/18/2011 2336 hours:  Comparison: Portable chest x-ray 12/08/2008 and two-view chest x- ray 09/01/2008, 03/12/2006.  Findings: Cardiac silhouette moderately enlarged but stable. Prominent central pulmonary arteries, unchanged.  Thoracic aorta atherosclerotic, unchanged.  Streaky airspace opacities at the left lung base.  Lungs otherwise clear.  Pulmonary vascularity normal.  IMPRESSION: Bronchopneumonia suspected at the left lung base.  Stable cardiomegaly without pulmonary edema.  Original Report Authenticated By: Arnell Sieving, M.D.    MEDICATIONS: Scheduled Meds:   . allopurinol  100 mg Oral Daily  . amLODipine  5 mg Oral Daily  . aspirin  81 mg Oral Daily  . bd getting started take home kit  1 kit Other Once  . ciprofloxacin  400 mg Intravenous Q12H  . digoxin  125 mcg Oral Daily  . docusate sodium  100 mg Oral BID  . feeding supplement  237 mL Oral BID BM  . gabapentin  300 mg Oral QHS  . insulin aspart  0-15 Units Subcutaneous TID WC  . insulin aspart  0-5 Units Subcutaneous QHS  . insulin aspart  4 Units Subcutaneous TID WC  . insulin glargine  12 Units Subcutaneous QHS  . levETIRAcetam  250 mg Oral Q12H  . living well with diabetes book   Does not apply Once  . metFORMIN  500 mg Oral BID WC  . metoprolol succinate  50 mg Oral BID  . oxybutynin  5 mg Oral TID  . simvastatin  20 mg Oral QHS  . Warfarin - Physician  Dosing Inpatient   Does not apply q1800  . DISCONTD: insulin aspart  3 Units Subcutaneous TID WC  . DISCONTD: insulin glargine  10 Units Subcutaneous QHS  . DISCONTD: insulin glargine  5 Units Subcutaneous QHS   Continuous Infusions:  PRN Meds:.acetaminophen, acetaminophen, alum & mag hydroxide-simeth, ondansetron (ZOFRAN) IV, ondansetron, zolpidem  Antibiotics: Anti-infectives     Start     Dose/Rate Route Frequency Ordered Stop   06/19/11 1200   ciprofloxacin (CIPRO) IVPB 400 mg        400 mg 200 mL/hr over 60 Minutes Intravenous Every 12 hours 06/19/11 1100     06/19/11 1000   ciprofloxacin (CIPRO) IVPB 400 mg  Status:  Discontinued        400 mg 200 mL/hr over 60 Minutes Intravenous Every 12 hours 06/19/11 0031 06/19/11 1100   06/19/11 0130   vancomycin (VANCOCIN) IVPB 1000 mg/200 mL premix  Status:  Discontinued        1,000 mg 200 mL/hr over 60 Minutes Intravenous Daily at bedtime 06/19/11 0031 06/19/11 1100   06/18/11 2230   ciprofloxacin (CIPRO) IVPB 400 mg        400 mg 200 mL/hr over 60 Minutes Intravenous  Once 06/18/11 2219 06/18/11 2325          Assessment/Plan: Patient Active Hospital Problem List: Urinary Tract Infection  -started on Vanc and Cipro. Change to Levaquin-given possible Broncho-pneumonia. Patient has an allergy to PCN. Urine Culture -pan sensitive E Coli  Uncontrolled DM II  - patient initially placed on glucose stabilizer, now on Lantus and SSI Moderate.  - CBG's very elevated today-will increase Lantus to 12 units and Novolog with meals -start metformin as well.  Unintentional Weight Loss, failure to thrive  -Estimated 25 lbs in 6 months, despite eating.TSH within normal limits -Patient denies pain  -Will request nutrition consult.  -Physical Therapy working with patient.  -no obvious evidence of malignancy-further work-up deferred to the outpatient setting  Atrial Fibrillation / history of CHF  -Patient with symptoms of decreased energy  / increased SOB will re-check Echo (last done in 2010).  -noted to be occasionally bradycardic in the 40s.  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 Patient to follow up with Dr Patty Sermons as an outpatient for this  Supratherapeutic INR  -Coumadin per Pharmacy   HTN (hypertension)  - Benign- uncontrolled. Resume home meds.  Hyperlipidemia  On zocor. Continue.  AKI (acute kidney injury)  -Resolved with IVF. Will continue to hold lasix for now.   Abnormal CXR  -will change antibiotic to Levaquin-should cover this as well.  DVT Prophylaxis:  -INR supra-therapeutic   Disposition  - home in am  Maretta Bees, MD. 06/20/2011, 1:40 PM

## 2011-06-20 NOTE — Clinical Social Work Psychosocial (Signed)
     Clinical Social Work Department BRIEF PSYCHOSOCIAL ASSESSMENT 06/20/2011  Patient:  Casey [name], Casey [name]     Account Number:  [id number]     Admit date:  06/18/2011  Clinical Social Worker:  Jacelyn Grip  Date/Time:  06/20/2011 10:00 AM  Referred by:  Physician  Date Referred:  06/20/2011 Referred for  SNF Placement   Other Referral:   Interview type:  Patient Other interview type:   and patient son via telephone    PSYCHOSOCIAL DATA Living Status:  ALONE Admitted from facility:   Level of care:   Primary support name:  Casimiro Needle Meckel/son/[phone] Primary support relationship to patient:  CHILD, ADULT Degree of support available:   adequate    CURRENT CONCERNS Current Concerns  Post-Acute Placement   Other Concerns:    SOCIAL WORK ASSESSMENT / PLAN CSW met with pt at bedside to discuss disposition planning. CSW discussed recommendation for short term rehab at SNF at discharge. Pt discussed that she was surprised that was the recommendation as prior to admission she was walking with a cane and per her report able to care for herself at home. Pt stated that she does not want to go to SNF at discharge, but confirms that she does not have 24 hour care. Pt discussed that she had previously been to SNF for short term rehab and she did not want to return. CSW clarified that pt would not have to return to same facility if that was the concern. CSW discussed home health services and pt stated that she would be agreeable to those services. CSW discussed that this CSW would ask PT to work with pt today to see how pt is progressing. Pt is agreeable to CSW contacting pt son to discuss. CSW contacted pt son via telephone to discuss disposition planning. Discussed that pt currently refusing rehab at SNF and PT planning to work with pt again today. Pt son expressed understanding, but concerned that pt does not have 24 hour supervision and has been weak due to her diabetes. CSW  notified PT and CSW to follow up with pt and pt son following PT treatment. MD and RNCM notified. CSW to follow as needed.   Assessment/plan status:  Psychosocial Support/Ongoing Assessment of Needs Other assessment/ plan:   discharge planning   Information/referral to community resources:   None at this time    PATIENTS/FAMILYS RESPONSE TO PLAN OF CARE: Pt alert and oriented x 4. Pt eager to participate with PT again today as she feels that she is at her baseline and does not want short term rehab. Pt son appears to be actively involved in care, but currently lives in [place].

## 2011-06-20 NOTE — Progress Notes (Signed)
ANTICOAGULATION CONSULT NOTE - Follow Up Consult  Pharmacy Consult for warfarin Indication: atrial fibrillation  Vital Signs: Temp: 97.8 F (36.6 C) (05/22 0426) Temp src: Oral (05/22 0426) BP: 153/85 mmHg (05/22 0426) Pulse Rate: 56  (05/22 0426)  Labs:  Basename 06/20/11 0515 06/19/11 0515 06/18/11 2313 06/18/11 2059  HGB -- 13.6 -- 15.3*  HCT -- 40.5 -- 44.9  PLT -- 203 -- 206  APTT -- 53* 54* --  LABPROT 35.7* 46.6* 42.2* --  INR 3.51* 4.93* 4.34* --  HEPARINUNFRC -- -- -- --  CREATININE 0.96 0.97 -- 1.17*  CKTOTAL -- -- -- --  CKMB -- -- -- --  TROPONINI -- -- -- --    Estimated Creatinine Clearance: 48.4 ml/min (by C-G formula based on Cr of 0.96).  Assessment: 71 yof on chronic coumadin for history of afib. Today INR has decreased but remains supratherapeutic at 3.51. Also started on cipro which can increase the INR. No bleeding noted. No CBC today.   Goal of Therapy:  INR 2-3   Plan:  1. No coumadin tonight - will likely restart tomorrow 2. F/u AM INR  Adela Esteban, Drake Leach 06/20/2011,8:26 AM

## 2011-06-20 NOTE — Progress Notes (Signed)
Instructed patient on insulin administration. Patient was able to return demonstration of self administration of insulin. Madelin Rear RN, CMSRN

## 2011-06-20 NOTE — Progress Notes (Signed)
Clinical Social Worker received notification from Physical Therapist that pt was much improved with mobility today during Physical Therapy treatment and discharge recommendation updated to Home PT with intermittent supervision. Clinical Social Worker notified pt son and RNCM. RNCM to assist with arranging home health services. No further social work needs identified at this time. Clinical Social Worker signing off.   Jacklynn Lewis, MSW, LCSWA  Clinical Social Work [phone]

## 2011-06-20 NOTE — Progress Notes (Signed)
Physical Therapy Treatment Patient Details Name: NOVA SCHMUHL MRN: [id number] DOB: December 07, 1940 Today's Date: 06/20/2011 Time: 3086-5784 PT Time Calculation (min): 30 min  PT Assessment / Plan / Recommendation Comments on Treatment Session  Pt adm with hyperglycemia.  Pt much improved with mobility today.  Pt at or very close to baseline.  Feel pt can return home with [facility] services.    Follow Up Recommendations  Home health PT;Supervision - Intermittent    Barriers to Discharge        Equipment Recommendations  None recommended by PT    Recommendations for Other Services    Frequency Min 3X/week   Plan Discharge plan needs to be updated;Frequency remains appropriate    Precautions / Restrictions Precautions Precautions: Fall Restrictions Weight Bearing Restrictions: No   Pertinent Vitals/Pain N/A    Mobility  Bed Mobility Supine to Sit: 6: Modified independent (Device/Increase time);HOB flat Sitting - Scoot to Edge of Bed: 6: Modified independent (Device/Increase time) Details for Bed Mobility Assistance: Incr time Transfers Sit to Stand: 6: Modified independent (Device/Increase time);With armrests;From bed;With upper extremity assist;From chair/3-in-1 Stand to Sit: 6: Modified independent (Device/Increase time);With armrests;To chair/3-in-1;With upper extremity assist Ambulation/Gait Ambulation/Gait Assistance: 5: Supervision Ambulation Distance (Feet): 200 Feet Assistive device: Straight cane Gait Pattern: Decreased step length - left Gait velocity: slow pace    Exercises     PT Diagnosis:    PT Problem List:   PT Treatment Interventions:     PT Goals Acute Rehab PT Goals PT Goal: Supine/Side to Sit - Progress: Met PT Goal: Sit to Stand - Progress: Met PT Goal: Stand to Sit - Progress: Met Pt will Ambulate: >150 feet;with modified independence;with cane PT Goal: Ambulate - Progress: Updated due to goal met  Visit Information  Last PT Received On:  06/20/11 Assistance Needed: +1    Subjective Data  Subjective: "I want to get on out of here."   Cognition  Overall Cognitive Status: 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) Arousal/Alertness: Awake/alert Orientation Level: Appears intact for tasks assessed Behavior During Session: [facility] for tasks performed    Balance  Static Sitting Balance Static Sitting - Balance Support: No upper extremity supported Static Sitting - Level of Assistance: 7: Independent Static Standing Balance Static Standing - Balance Support: Left upper extremity supported Static Standing - Level of Assistance: 6: Modified independent (Device/Increase time)  End of Session PT - End of Session Equipment Utilized During Treatment: Gait belt Activity Tolerance: Patient tolerated treatment well Patient left: in chair;with call bell/phone within reach;with chair alarm set    [facility] 06/20/2011, 10:32 AM  Skip Mayer PT [phone]

## 2011-06-21 DIAGNOSIS — R7989 Other specified abnormal findings of blood chemistry: Secondary | ICD-10-CM

## 2011-06-21 DIAGNOSIS — I10 Essential (primary) hypertension: Secondary | ICD-10-CM

## 2011-06-21 DIAGNOSIS — E782 Mixed hyperlipidemia: Secondary | ICD-10-CM

## 2011-06-21 DIAGNOSIS — N179 Acute kidney failure, unspecified: Secondary | ICD-10-CM

## 2011-06-21 LAB — PROTIME-INR
INR: 2.3 — ABNORMAL HIGH (ref 0.00–1.49)
Prothrombin Time: 25.7 seconds — ABNORMAL HIGH (ref 11.6–15.2)

## 2011-06-21 LAB — GLUCOSE, CAPILLARY

## 2011-06-21 MED ORDER — LEVOFLOXACIN 500 MG PO TABS: 500.0000 mg | ORAL_TABLET | Freq: Every day | ORAL | Status: AC

## 2011-06-21 MED ORDER — INSULIN ASPART 100 UNIT/ML ~~LOC~~ SOLN: 4.0000 [IU] | Freq: Three times a day (TID) | SUBCUTANEOUS | Status: DC

## 2011-06-21 MED ORDER — INSULIN GLARGINE 100 UNIT/ML ~~LOC~~ SOLN: 12.0000 [IU] | Freq: Every day | SUBCUTANEOUS | Status: DC

## 2011-06-21 MED ORDER — METFORMIN HCL 500 MG PO TABS: 500.0000 mg | ORAL_TABLET | Freq: Two times a day (BID) | ORAL | Status: DC

## 2011-06-21 NOTE — Progress Notes (Signed)
Cattaleya A Putnam to be D/C'd Home per MD order.  Discussed with the patient the After Visit Summary and all questions fully answered. IV discontinued, no bleeding noted from site. Diabetic teaching completed.    Susann Givens, RN, [facility] 06/21/2011 10:10 AM

## 2011-06-21 NOTE — Discharge Summary (Signed)
PATIENT DETAILS Name: Casey [name] Age: 71 y.o. Sex: female Date of Birth: 08-Jan-1941 MRN: [id number]. Admit Date: 06/18/2011 Admitting Physician: Conley Canal, MD JXB:JYNWGN,FAOZH D, MD, MD  PRIMARY DISCHARGE DIAGNOSIS:  Active Problems:  Uncontrolled type 2 DM with hyperosmolar nonketotic hyperglycemia  UTI (lower urinary tract infection)  HTN (hypertension), benign  A-fib  Current use of long term anticoagulation  Hyperlipidemia  AKI (acute kidney injury)      PAST MEDICAL HISTORY: Past Medical History  Diagnosis Date  . Chronic atrial fibrillation   . Stroke   . Hypertension   . Hypercholesterolemia   . Diabetes mellitus     DISCHARGE MEDICATIONS: Medication List  As of 06/21/2011  9:56 AM   TAKE these medications         allopurinol 100 MG tablet   Commonly known as: ZYLOPRIM   Take 100 mg by mouth daily.      amLODipine 5 MG tablet   Commonly known as: NORVASC   Take 5 mg by mouth daily.      aspirin 81 MG tablet   Take 1 tablet (81 mg total) by mouth daily.      digoxin 0.125 MG tablet   Commonly known as: LANOXIN   Take 125 mcg by mouth daily.      donepezil 10 MG tablet   Commonly known as: ARICEPT   Take 10 mg by mouth daily.      furosemide 20 MG tablet   Commonly known as: LASIX   Take 20 mg by mouth daily.      insulin aspart 100 UNIT/ML injection   Commonly known as: novoLOG   Inject 4 Units into the skin 3 (three) times daily with meals.      insulin glargine 100 UNIT/ML injection   Commonly known as: LANTUS   Inject 12 Units into the skin at bedtime.      levETIRAcetam 250 MG tablet   Commonly known as: KEPPRA   Take 250 mg by mouth every 12 (twelve) hours.      levofloxacin 500 MG tablet   Commonly known as: LEVAQUIN   Take 1 tablet (500 mg total) by mouth daily.      metFORMIN 500 MG tablet   Commonly known as: GLUCOPHAGE   Take 1 tablet (500 mg total) by mouth 2 (two) times daily with a meal.      metoprolol succinate  50 MG 24 hr tablet   Commonly known as: TOPROL-XL   Take 50 mg by mouth 2 (two) times daily. Take with or immediately following a meal.      oxybutynin 5 MG tablet   Commonly known as: DITROPAN   Take 5-10 mg by mouth 2 (two) times daily. Take 1 tab in the morning and 2 tab at night      potassium chloride 10 MEQ tablet   Commonly known as: K-DUR,KLOR-CON   Take 10 mEq by mouth daily.      simvastatin 20 MG tablet   Commonly known as: ZOCOR   Take 1 tablet (20 mg total) by mouth at bedtime.      traMADol 50 MG tablet   Commonly known as: ULTRAM   Take 50 mg by mouth 3 (three) times daily as needed. For pain      warfarin 5 MG tablet   Commonly known as: COUMADIN   Take 1 tablet (5 mg total) by mouth as directed. 1 tablet daily except 1/2 tablet on Tuesdays and Saturdays.  BRIEF HPI:  See H&P, Labs, Consult and Test reports for all details in brief, Ms Hew came in with high sugars one day prior to admission. She states that the meter showed unreadable value. In ED her sugars were noted to be greater than 400mg /dl. She was then started on IVF and GlucoStabilizer and referred for admission. She was also found to have uti, hence given cipro. She states that her sugars have been high in the last month, but insists she has been compliant with her meds and diet. She has been increasingly thirsty. She denies fever, diarrhea or dysuria or cough. She has lost significant amount of weight in the last 2 months such that she now wears size 10 instead of size 12.  CONSULTATIONS:   None  PERTINENT RADIOLOGIC STUDIES: Dg Chest Port 1 View  06/18/2011  *RADIOLOGY REPORT*  Clinical Data: Fever.  Chills.  Hyperglycemia.  PORTABLE CHEST - 1 VIEW 06/18/2011 2336 hours:  Comparison: Portable chest x-ray 12/08/2008 and two-view chest x- ray 09/01/2008, 03/12/2006.  Findings: Cardiac silhouette moderately enlarged but stable. Prominent central pulmonary arteries, unchanged.  Thoracic  aorta atherosclerotic, unchanged.  Streaky airspace opacities at the left lung base.  Lungs otherwise clear.  Pulmonary vascularity normal.  IMPRESSION: Bronchopneumonia suspected at the left lung base.  Stable cardiomegaly without pulmonary edema.  Original Report Authenticated By: Arnell Sieving, M.D.     PERTINENT LAB RESULTS: CBC:  Basename 06/19/11 0515 06/18/11 2059  WBC 6.0 7.3  HGB 13.6 15.3*  HCT 40.5 44.9  PLT 203 206   CMET CMP     Component Value Date/Time   NA 137 06/20/2011 0515   K 4.2 06/20/2011 0515   CL 105 06/20/2011 0515   CO2 22 06/20/2011 0515   GLUCOSE 273* 06/20/2011 0515   BUN 14 06/20/2011 0515   CREATININE 0.96 06/20/2011 0515   CALCIUM 9.5 06/20/2011 0515   PROT 6.9 06/19/2011 0515   ALBUMIN 3.4* 06/19/2011 0515   AST 17 06/19/2011 0515   ALT 18 06/19/2011 0515   ALKPHOS 88 06/19/2011 0515   BILITOT 0.4 06/19/2011 0515   GFRNONAA 58* 06/20/2011 0515   GFRAA 67* 06/20/2011 0515    GFR Estimated Creatinine Clearance: 48.4 ml/min (by C-G formula based on Cr of 0.96). No results found for this basename: LIPASE:2,AMYLASE:2 in the last 72 hours No results found for this basename: CKTOTAL:3,CKMB:3,CKMBINDEX:3,TROPONINI:3 in the last 72 hours No components found with this basename: POCBNP:3 No results found for this basename: DDIMER:2 in the last 72 hours  Basename 06/19/11 1058 06/19/11 0515  HGBA1C 13.2* 12.1*   No results found for this basename: CHOL:2,HDL:2,LDLCALC:2,TRIG:2,CHOLHDL:2,LDLDIRECT:2 in the last 72 hours  Basename 06/19/11 0515  TSH 3.624  T4TOTAL --  T3FREE --  THYROIDAB --   No results found for this basename: VITAMINB12:2,FOLATE:2,FERRITIN:2,TIBC:2,IRON:2,RETICCTPCT:2 in the last 72 hours Coags:  Basename 06/21/11 0536 06/20/11 0515  INR 2.30* 3.51*   Microbiology: Recent Results (from the past 240 hour(s))  URINE CULTURE     Status: Normal   Collection Time   06/18/11  9:27 PM      Component Value Range Status Comment    Specimen Description URINE, CLEAN CATCH   Final    Special Requests NONE   Final    Culture  Setup Time [id number]   Final    Colony Count >=100,000 COLONIES/ML   Final    Culture ESCHERICHIA COLI   Final    Report Status 06/20/2011 FINAL   Final  Organism ID, Bacteria ESCHERICHIA COLI   Final   CULTURE, BLOOD (ROUTINE X 2)     Status: Normal (Preliminary result)   Collection Time   06/18/11 11:30 PM      Component Value Range Status Comment   Specimen Description BLOOD RIGHT ARM   Final    Special Requests BOTTLES DRAWN AEROBIC AND ANAEROBIC 10CC EACH   Final    Culture  Setup Time [id number]   Final    Culture     Final    Value:        BLOOD CULTURE RECEIVED NO GROWTH TO DATE CULTURE WILL BE HELD FOR 5 DAYS BEFORE ISSUING A FINAL NEGATIVE REPORT   Report Status PENDING   Incomplete   CULTURE, BLOOD (ROUTINE X 2)     Status: Normal (Preliminary result)   Collection Time   06/18/11 11:45 PM      Component Value Range Status Comment   Specimen Description BLOOD RIGHT HAND   Final    Special Requests BOTTLES DRAWN AEROBIC ONLY 4CC   Final    Culture  Setup Time [id number]   Final    Culture     Final    Value:        BLOOD CULTURE RECEIVED NO GROWTH TO DATE CULTURE WILL BE HELD FOR 5 DAYS BEFORE ISSUING A FINAL NEGATIVE REPORT   Report Status PENDING   Incomplete      BRIEF HOSPITAL COURSE:   Active Problems:  Uncontrolled type 2 DM with hyperosmolar nonketotic hyperglycemia -patient initially placed on glucose stabilizer, and then transitioned to Lantus and scheduled pre-meal Novolog  - started on  metformin as well. -CBG's still on the higher side-however will slowly need to increase and optimize insulin regimen to prevent hypoglycemic episodes -Insulin and diabetic education have been done, patient has been taught how to inject insulin, home RN has been set up   UTI (lower urinary tract infection) -Urine cultures-pan sensitive E Coli-initially on Cipro-but questionable  bronchopneumonia seen on CXR-therefore changed to Levaquin-she will take this for 3 more days on discharge   HTN (hypertension), benign - controlled, continue with pre-admission meds   A-fib -rate controlled -continue with digoxin, metoprolol -on chronic coumadin therpay -INR check scheduled tomorrow at [facility]  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 Patient to follow up with Dr Patty Sermons as an outpatient for this   Current use of long term anticoagulation -continue with coumadin on discharge   Hyperlipidemia -continue with Statin   AKI (acute kidney injury) Resolved with IVF. Resume low dose lasix on discharge  TODAY-DAY OF DISCHARGE:  Subjective:   Charlyne Robertshaw today has no headache,no chest abdominal pain,no new weakness tingling or numbness, feels much better wants to go home today.   Objective:   Blood pressure 112/65, pulse 68, temperature 97.4 F (36.3 C), temperature source Oral, resp. rate 20, height 5\' 5"  (1.651 m), weight 66.8 kg (147 lb 4.3 oz), SpO2 96.00%.  Intake/Output Summary (Last 24 hours) at 06/21/11 0956 Last data filed at 06/21/11 0900  Gross per 24 hour  Intake    240 ml  Output    350 ml  Net   -110 ml    Exam Awake Alert, Oriented *3, No new F.N deficits, Normal affect [place].AT,PERRAL Supple Neck,No JVD, No cervical lymphadenopathy appriciated.  Symmetrical Chest wall movement, Good  air movement bilaterally, CTAB RRR,No Gallops,Rubs or new Murmurs, No Parasternal Heave +ve B.Sounds, Abd Soft, Non tender, No organomegaly appriciated, No rebound -guarding or rigidity. No Cyanosis, Clubbing or edema, No new Rash or bruise  DISPOSITION: Home with Home Health Serices   DISCHARGE INSTRUCTIONS:    Follow-up Information    Follow up with [facility] on 06/22/2011. (10:45 am)    Contact information:   [phone],  [facility]      Follow up with Vida Roller, MD. Schedule an appointment as soon as possible for a visit in 1 week.   Contact information:   [address] [place] [address] [phone]       Follow up with Cassell Clement, MD. Schedule an appointment as soon as possible for a visit in 1 week.   Contact information:   [address], [address] [place] [address] [phone]          Total Time spent on discharge equals 45 minutes.  SignedJeoffrey Massed 06/21/2011 9:56 AM

## 2011-06-21 NOTE — Progress Notes (Signed)
Physical Therapy Treatment Patient Details Name: Casey [name] MRN: [id number] DOB: 10-15-1940 Today's Date: 06/21/2011 Time: 4098-1191 PT Time Calculation (min): 10 min  PT Assessment / Plan / Recommendation Comments on Treatment Session  Pt adm with hyperglycemia.  Pt more fatigued today so amb distance not as great today.  Pt to return home with [facility] services including PT.    Follow Up Recommendations  Home health PT    Barriers to Discharge        Equipment Recommendations  None recommended by PT    Recommendations for Other Services    Frequency Min 3X/week   Plan Discharge plan remains appropriate;Frequency remains appropriate    Precautions / Restrictions Precautions Precautions: Fall Restrictions Weight Bearing Restrictions: No   Pertinent Vitals/Pain N/A    Mobility  Bed Mobility Supine to Sit: 6: Modified independent (Device/Increase time) Sitting - Scoot to Edge of Bed: 6: Modified independent (Device/Increase time) Sit to Supine: 6: Modified independent (Device/Increase time) Details for Bed Mobility Assistance: Incr time Transfers Sit to Stand: 6: Modified independent (Device/Increase time);From bed;With upper extremity assist Stand to Sit: 6: Modified independent (Device/Increase time);With upper extremity assist;To bed Ambulation/Gait Ambulation/Gait Assistance: 5: Supervision Ambulation Distance (Feet): 75 Feet Assistive device: Straight cane Gait Pattern: Decreased step length - left Gait velocity: slow pace    Exercises     PT Diagnosis:    PT Problem List:   PT Treatment Interventions:     PT Goals Acute Rehab PT Goals PT Goal: Sit to Supine/Side - Progress: Met PT Goal: Ambulate - Progress: Progressing toward goal  Visit Information  Last PT Received On: 06/21/11 Assistance Needed: +1    Subjective Data  Subjective: Pt states she is tired this morning because she didn't sleep much last night.   Cognition  Overall Cognitive Status:  History of cognitive impairments - at baseline Arousal/Alertness: Awake/alert Orientation Level: Appears intact for tasks assessed Behavior During Session: [facility] for tasks performed    Balance  Static Standing Balance Static Standing - Balance Support: Left upper extremity supported Static Standing - Level of Assistance: 6: Modified independent (Device/Increase time)  End of Session PT - End of Session Equipment Utilized During Treatment: Gait belt Activity Tolerance: Patient limited by fatigue Patient left: in bed;with call bell/phone within reach;with bed alarm set Nurse Communication: Mobility status    Teige Rountree 06/21/2011, 10:04 AM  Skip Mayer PT [phone]

## 2011-06-22 ENCOUNTER — Other Ambulatory Visit (HOSPITAL_COMMUNITY): Payer: Self-pay | Admitting: *Deleted

## 2011-06-22 ENCOUNTER — Ambulatory Visit (INDEPENDENT_AMBULATORY_CARE_PROVIDER_SITE_OTHER): Payer: Self-pay | Admitting: Cardiology

## 2011-06-22 ENCOUNTER — Encounter: Payer: Self-pay | Admitting: Vascular Surgery

## 2011-06-22 DIAGNOSIS — R0989 Other specified symptoms and signs involving the circulatory and respiratory systems: Secondary | ICD-10-CM

## 2011-06-22 DIAGNOSIS — I4891 Unspecified atrial fibrillation: Secondary | ICD-10-CM

## 2011-06-22 DIAGNOSIS — E7800 Pure hypercholesterolemia, unspecified: Secondary | ICD-10-CM

## 2011-06-22 LAB — POCT INR: INR: 1.7

## 2011-06-22 MED ORDER — SIMVASTATIN 20 MG PO TABS: 20.0000 mg | ORAL_TABLET | Freq: Every day | ORAL | Status: DC

## 2011-06-22 NOTE — Telephone Encounter (Signed)
Refilled simvastatin.

## 2011-06-25 LAB — CULTURE, BLOOD (ROUTINE X 2)
Culture  Setup Time: 201305210329
Culture: NO GROWTH
Culture: NO GROWTH

## 2011-06-26 ENCOUNTER — Ambulatory Visit: Payer: Medicare Other | Admitting: Vascular Surgery

## 2011-06-26 ENCOUNTER — Other Ambulatory Visit: Payer: Medicare Other

## 2011-06-26 ENCOUNTER — Telehealth: Payer: Self-pay | Admitting: Cardiology

## 2011-06-26 NOTE — Telephone Encounter (Signed)
Will forward to  Dr. Patty Sermons and anti coag clinic

## 2011-06-26 NOTE — Telephone Encounter (Signed)
Per Elmer Bales- [facility] [phone]  Patient has moved to [place] with her daughter for the time being, [facility] will transfer this to their [facility] to handle.

## 2011-06-26 NOTE — Telephone Encounter (Signed)
Noted  

## 2011-06-27 ENCOUNTER — Encounter: Payer: Self-pay | Admitting: Cardiology

## 2011-06-27 DIAGNOSIS — I4891 Unspecified atrial fibrillation: Secondary | ICD-10-CM

## 2011-06-27 NOTE — Progress Notes (Signed)
This encounter was created in error - please disregard.

## 2011-06-27 NOTE — Telephone Encounter (Signed)
Spoke with home health care Casey [name] and she stated daughter would be bringing her to town Friday to get INR

## 2011-06-29 ENCOUNTER — Telehealth: Payer: Self-pay | Admitting: Cardiology

## 2011-06-29 NOTE — Telephone Encounter (Signed)
New Problem:    Called in requesting verbal orders for an occupational therapist and home health aid for the patient.  Please call back.

## 2011-06-29 NOTE — Telephone Encounter (Signed)
Verbal orders given  

## 2011-07-02 ENCOUNTER — Telehealth: Payer: Self-pay | Admitting: *Deleted

## 2011-07-02 NOTE — Telephone Encounter (Signed)
Received call from [place], [facility]. Patient is not back in town and has not had her INR checked since 06/22/11 but due 06/29/2011. Patient presently with daughter in [place] and will not be back in [place] until 07/09/2011. The nurse is asking if we should get an INR while pt in [place] done by [facility] or can pt wait until 07/09/2011, advised by coumadin clinic due to last 2 INR being below 2 out of range and there has been a recent dose change patient does need INR arranged ASAP with [facility] in [place].

## 2011-07-03 NOTE — Telephone Encounter (Signed)
Gave order to [facility], [facility],  for an INR check 07/05/2011 call results to [facility].

## 2011-07-05 ENCOUNTER — Ambulatory Visit: Payer: Self-pay | Admitting: Internal Medicine

## 2011-07-05 DIAGNOSIS — I4891 Unspecified atrial fibrillation: Secondary | ICD-10-CM

## 2011-07-05 LAB — POCT INR: INR: 3.2

## 2011-07-19 ENCOUNTER — Ambulatory Visit: Payer: Self-pay | Admitting: Cardiology

## 2011-07-19 ENCOUNTER — Telehealth: Payer: Self-pay | Admitting: Cardiology

## 2011-07-19 DIAGNOSIS — I4891 Unspecified atrial fibrillation: Secondary | ICD-10-CM

## 2011-07-19 LAB — POCT INR: INR: 2.2

## 2011-07-19 NOTE — Telephone Encounter (Signed)
Casey [name] with [facility] calling re getting verbal order for having pt's blood sugar checked, since stroke not abel to do it herself, it was checked today while they were out and it was 164 fasting

## 2011-07-19 NOTE — Telephone Encounter (Signed)
Spoke with nurse/ beth, explained dr/nurse on vacation/ didn't feel comfortable giving verbal for glucose checks, pt had pcp but not currently, told beth to encourage pt to establish with a pcp and i will forward msg to dr/nurse. Agreed to plan.

## 2011-07-23 NOTE — Telephone Encounter (Signed)
Verbal ok given to [facility] for glucose checks.  Beth again stated she is working with patient to get PCP.  Did schedule follow up with  Dr. Patty Sermons for next  week

## 2011-07-23 NOTE — Telephone Encounter (Signed)
Agree with plan 

## 2011-07-25 ENCOUNTER — Telehealth: Payer: Self-pay | Admitting: Cardiology

## 2011-07-25 NOTE — Telephone Encounter (Signed)
error 

## 2011-07-25 NOTE — Telephone Encounter (Signed)
Number taken wrong, called [facility] and verbal ok given

## 2011-07-25 NOTE — Telephone Encounter (Signed)
New msg Casey [name] from [facility] wants to get two week extension for Physical therapy

## 2011-08-01 ENCOUNTER — Ambulatory Visit (INDEPENDENT_AMBULATORY_CARE_PROVIDER_SITE_OTHER): Payer: Medicare Other | Admitting: Pharmacist

## 2011-08-01 ENCOUNTER — Ambulatory Visit (INDEPENDENT_AMBULATORY_CARE_PROVIDER_SITE_OTHER): Payer: Medicare Other | Admitting: Cardiology

## 2011-08-01 ENCOUNTER — Encounter: Payer: Self-pay | Admitting: Cardiology

## 2011-08-01 VITALS — BP 122/78 | HR 66 | Ht 65.0 in | Wt 149.0 lb

## 2011-08-01 DIAGNOSIS — I119 Hypertensive heart disease without heart failure: Secondary | ICD-10-CM

## 2011-08-01 DIAGNOSIS — I4891 Unspecified atrial fibrillation: Secondary | ICD-10-CM

## 2011-08-01 DIAGNOSIS — IMO0001 Reserved for inherently not codable concepts without codable children: Secondary | ICD-10-CM

## 2011-08-01 DIAGNOSIS — I10 Essential (primary) hypertension: Secondary | ICD-10-CM

## 2011-08-01 LAB — POCT INR: INR: 3

## 2011-08-01 NOTE — Assessment & Plan Note (Signed)
The patient has not had any new TIA or stroke symptoms.  He remains on Coumadin and is not having any bleeding problems from the Coumadin

## 2011-08-01 NOTE — Assessment & Plan Note (Signed)
Blood pressure is adequate control on current medication.  He denies any headaches or dizzy spells.

## 2011-08-01 NOTE — Patient Instructions (Addendum)
Your physician recommends that you continue on your current medications as directed. Please refer to the Current Medication list given to you today.  Your physician recommends that you schedule a follow-up appointment in: 4 months  

## 2011-08-01 NOTE — Assessment & Plan Note (Signed)
The patient denies any hypoglycemic episodes 

## 2011-08-01 NOTE — Progress Notes (Signed)
Casey [name] Date of Birth:  December 09, 1940 [facility] [address] [place], [place]  [address] [phone]         Fax   [phone]  History of Present Illness: This pleasant elderly African American woman is seen for a scheduled followup office visit.  He has a history of atrial fibrillation and prior embolic strokes.  She has a long history of high blood pressure.  She has diabetes and dyslipidemia.  She now has a new primary care provider at [facility] care who is helping her with her insulin.  He states that her heart has been feeling better.  She has not been having any chest pain or shortness of breath.  She apparently was hospitalized in may 2013 with poorly controlled diabetes.  Current Outpatient Prescriptions  Medication Sig Dispense Refill  . allopurinol (ZYLOPRIM) 100 MG tablet Take 100 mg by mouth daily.        Marland Kitchen amLODipine (NORVASC) 5 MG tablet Take 5 mg by mouth daily.        Marland Kitchen aspirin 81 MG tablet Take 1 tablet (81 mg total) by mouth daily.  30 tablet  11  . digoxin (LANOXIN) 0.125 MG tablet Take 125 mcg by mouth daily.        Marland Kitchen donepezil (ARICEPT) 10 MG tablet Take 10 mg by mouth daily.      . furosemide (LASIX) 20 MG tablet Take 20 mg by mouth daily.        . insulin aspart (NOVOLOG) 100 UNIT/ML injection Inject 4 Units into the skin 3 (three) times daily with meals.  1 vial  0  . insulin glargine (LANTUS) 100 UNIT/ML injection Inject 12 Units into the skin at bedtime.  10 mL  0  . metFORMIN (GLUCOPHAGE) 500 MG tablet Take 1 tablet (500 mg total) by mouth 2 (two) times daily with a meal.  60 tablet  0  . metoprolol succinate (TOPROL-XL) 50 MG 24 hr tablet Take 50 mg by mouth 2 (two) times daily. Take with or immediately following a meal.      . oxybutynin (DITROPAN) 5 MG tablet Take 5-10 mg by mouth 2 (two) times daily. Take 1 tab in the morning and 2 tab at night      . potassium chloride (K-DUR,KLOR-CON) 10 MEQ tablet Take 10 mEq by mouth  daily.        . simvastatin (ZOCOR) 20 MG tablet Take 1 tablet (20 mg total) by mouth at bedtime.  30 tablet  11  . traMADol (ULTRAM) 50 MG tablet Take 50 mg by mouth 3 (three) times daily as needed. For pain      . warfarin (COUMADIN) 5 MG tablet Take 1 tablet (5 mg total) by mouth as directed. 1 tablet daily except 1/2 tablet on Tuesdays and Saturdays.  30 tablet  3    Allergies  Allergen Reactions  . Penicillins     Patient Active Problem List  Diagnosis  . Atrial fibrillation  . Chronic atrial fibrillation  . Stroke  . Hypertension  . Hypercholesterolemia  . Diabetes mellitus  . Diabetes mellitus without mention of complication  . Uncontrolled type 2 DM with hyperosmolar nonketotic hyperglycemia  . UTI (lower urinary tract infection)  . HTN (hypertension), benign  . A-fib  . Current use of long term anticoagulation  . Hyperlipidemia  . AKI (acute kidney injury)    History  Smoking status  . Former Smoker  . Types: Cigarettes  . Quit  date: 01/29/2001  Smokeless tobacco  . Never Used    History  Alcohol Use No    Family History  Problem Relation Age of Onset  . Cancer Mother   . Stroke Mother   . Stroke Father     Review of Systems: Constitutional: no fever chills diaphoresis or fatigue or change in weight.  Head and neck: no hearing loss, no epistaxis, no photophobia or visual disturbance. Respiratory: No cough, shortness of breath or wheezing. Cardiovascular: No chest pain peripheral edema, palpitations. Gastrointestinal: No abdominal distention, no abdominal pain, no change in bowel habits hematochezia or melena. Genitourinary: No dysuria, no frequency, no urgency, no nocturia. Musculoskeletal:No arthralgias, no back pain, no gait disturbance or myalgias. Neurological: No dizziness, no headaches, no numbness, no seizures, no syncope, no weakness, no tremors. Hematologic: No lymphadenopathy, no easy bruising. Psychiatric: No confusion, no hallucinations,  no sleep disturbance.    Physical Exam: Filed Vitals:   08/01/11 1521  BP: 122/78  Pulse: 66   the general appearance reveals a well-developed well-nourished elderly woman with evidence of a prior left brain stroke with right hemiparesis.The head and neck exam reveals pupils equal and reactive.  Extraocular movements are full.  There is no scleral icterus.  The mouth and pharynx are normal.  The neck is supple.  The carotids reveal no bruits.  The jugular venous pressure is normal.  The  thyroid is not enlarged.  There is no lymphadenopathy.  The chest is clear to percussion and auscultation.  There are no rales or rhonchi.  Expansion of the chest is symmetrical.  The precordium is quiet.  The first heart sound is normal.  The second heart sound is physiologically split.  There is a soft systolic ejection murmur at the base.  There is no abnormal lift or heave.  The abdomen is soft and nontender.  The bowel sounds are normal.  The liver and spleen are not enlarged.  There are no abdominal masses.  There are no abdominal bruits.  Extremities reveal good pedal pulses.  There is no phlebitis or edema.  There is no cyanosis or clubbing.   There is evidence of prior left brain stroke with right hemiparesis  There are no sensory deficits.  The skin is warm and dry.  There is no rash.    Assessment / Plan: Continue same medication.  Recheck in 4 months for a followup office visit.  Recheck in Coumadin clinic as directed

## 2011-08-10 ENCOUNTER — Telehealth: Payer: Self-pay | Admitting: Cardiology

## 2011-08-10 NOTE — Telephone Encounter (Signed)
[facility] would like to recertify pt for additional home care for a month or two

## 2011-08-10 NOTE — Telephone Encounter (Signed)
T.O. Ok to continue-given to [employer]

## 2011-08-15 ENCOUNTER — Ambulatory Visit: Payer: Self-pay | Admitting: Internal Medicine

## 2011-08-15 DIAGNOSIS — I4891 Unspecified atrial fibrillation: Secondary | ICD-10-CM

## 2011-08-15 LAB — POCT INR: INR: 2.7

## 2011-08-21 DIAGNOSIS — I4891 Unspecified atrial fibrillation: Secondary | ICD-10-CM

## 2011-08-30 ENCOUNTER — Other Ambulatory Visit: Payer: Self-pay | Admitting: Internal Medicine

## 2011-08-30 DIAGNOSIS — Z780 Asymptomatic menopausal state: Secondary | ICD-10-CM

## 2011-09-06 ENCOUNTER — Ambulatory Visit: Payer: Self-pay | Admitting: Cardiology

## 2011-09-06 DIAGNOSIS — I4891 Unspecified atrial fibrillation: Secondary | ICD-10-CM

## 2011-09-06 LAB — POCT INR: INR: 2.4

## 2011-09-11 ENCOUNTER — Other Ambulatory Visit: Payer: Medicare Other

## 2011-09-25 ENCOUNTER — Other Ambulatory Visit: Payer: Medicare Other

## 2011-09-27 ENCOUNTER — Telehealth: Payer: Self-pay | Admitting: Cardiology

## 2011-09-27 ENCOUNTER — Encounter: Payer: Self-pay | Admitting: *Deleted

## 2011-09-27 NOTE — Telephone Encounter (Signed)
New problem:  Will Dr. Patty Sermons filled out a form - patient is summoned for jury duty.

## 2011-09-27 NOTE — Telephone Encounter (Signed)
Beth from [facility] returning nurse MP call, she can be reached at [phone].  Beth said the jury date is 11/05/11

## 2011-09-27 NOTE — Telephone Encounter (Signed)
Left message to call back  

## 2011-09-27 NOTE — Telephone Encounter (Signed)
Left message with Waynetta Sandy that letter being mailed out today

## 2011-10-08 ENCOUNTER — Ambulatory Visit: Payer: Self-pay | Admitting: Internal Medicine

## 2011-10-08 DIAGNOSIS — I4891 Unspecified atrial fibrillation: Secondary | ICD-10-CM

## 2011-10-11 ENCOUNTER — Encounter: Payer: Self-pay | Admitting: Cardiology

## 2011-11-05 ENCOUNTER — Ambulatory Visit (INDEPENDENT_AMBULATORY_CARE_PROVIDER_SITE_OTHER): Payer: Medicare Other | Admitting: Pharmacist

## 2011-11-05 DIAGNOSIS — I4891 Unspecified atrial fibrillation: Secondary | ICD-10-CM

## 2011-11-12 ENCOUNTER — Ambulatory Visit
Admission: RE | Admit: 2011-11-12 | Discharge: 2011-11-12 | Disposition: A | Discharge status: Home / Self Care | Payer: Medicare Other | Source: Ambulatory Visit | Attending: Internal Medicine | Admitting: Internal Medicine

## 2011-11-12 ENCOUNTER — Other Ambulatory Visit: Payer: Self-pay | Admitting: Internal Medicine

## 2011-11-12 DIAGNOSIS — R1904 Left lower quadrant abdominal swelling, mass and lump: Secondary | ICD-10-CM

## 2011-12-04 ENCOUNTER — Inpatient Hospital Stay (HOSPITAL_COMMUNITY)
Admission: EM | Admit: 2011-12-04 | Discharge: 2011-12-11 | DRG: 909 | Disposition: A | Discharge status: Skilled Nursing Facility | Payer: Medicare Other | Attending: Vascular Surgery | Admitting: Vascular Surgery

## 2011-12-04 ENCOUNTER — Encounter: Payer: Self-pay | Admitting: Cardiology

## 2011-12-04 ENCOUNTER — Ambulatory Visit (INDEPENDENT_AMBULATORY_CARE_PROVIDER_SITE_OTHER): Payer: Medicare Other | Admitting: Cardiology

## 2011-12-04 ENCOUNTER — Emergency Department (HOSPITAL_COMMUNITY): Payer: Medicare Other

## 2011-12-04 ENCOUNTER — Encounter (HOSPITAL_COMMUNITY): Payer: Self-pay | Admitting: Emergency Medicine

## 2011-12-04 VITALS — BP 160/82 | HR 63 | Resp 18 | Ht 65.0 in | Wt 158.0 lb

## 2011-12-04 DIAGNOSIS — Z7982 Long term (current) use of aspirin: Secondary | ICD-10-CM

## 2011-12-04 DIAGNOSIS — Z8673 Personal history of transient ischemic attack (TIA), and cerebral infarction without residual deficits: Secondary | ICD-10-CM

## 2011-12-04 DIAGNOSIS — Z794 Long term (current) use of insulin: Secondary | ICD-10-CM

## 2011-12-04 DIAGNOSIS — I724 Aneurysm of artery of lower extremity: Secondary | ICD-10-CM | POA: Diagnosis present

## 2011-12-04 DIAGNOSIS — Z87891 Personal history of nicotine dependence: Secondary | ICD-10-CM

## 2011-12-04 DIAGNOSIS — Z79899 Other long term (current) drug therapy: Secondary | ICD-10-CM

## 2011-12-04 DIAGNOSIS — Z7901 Long term (current) use of anticoagulants: Secondary | ICD-10-CM

## 2011-12-04 DIAGNOSIS — R109 Unspecified abdominal pain: Secondary | ICD-10-CM

## 2011-12-04 DIAGNOSIS — I729 Aneurysm of unspecified site: Secondary | ICD-10-CM

## 2011-12-04 DIAGNOSIS — K219 Gastro-esophageal reflux disease without esophagitis: Secondary | ICD-10-CM | POA: Diagnosis present

## 2011-12-04 DIAGNOSIS — Y849 Medical procedure, unspecified as the cause of abnormal reaction of the patient, or of later complication, without mention of misadventure at the time of the procedure: Secondary | ICD-10-CM | POA: Diagnosis present

## 2011-12-04 DIAGNOSIS — I739 Peripheral vascular disease, unspecified: Secondary | ICD-10-CM | POA: Diagnosis present

## 2011-12-04 DIAGNOSIS — E7800 Pure hypercholesterolemia, unspecified: Secondary | ICD-10-CM

## 2011-12-04 DIAGNOSIS — I4891 Unspecified atrial fibrillation: Secondary | ICD-10-CM | POA: Diagnosis present

## 2011-12-04 DIAGNOSIS — T85898A Other specified complication of other internal prosthetic devices, implants and grafts, initial encounter: Principal | ICD-10-CM | POA: Diagnosis present

## 2011-12-04 DIAGNOSIS — E119 Type 2 diabetes mellitus without complications: Secondary | ICD-10-CM | POA: Diagnosis present

## 2011-12-04 DIAGNOSIS — R0989 Other specified symptoms and signs involving the circulatory and respiratory systems: Secondary | ICD-10-CM

## 2011-12-04 DIAGNOSIS — I10 Essential (primary) hypertension: Secondary | ICD-10-CM | POA: Diagnosis present

## 2011-12-04 HISTORY — DX: Heart failure, unspecified: I50.9

## 2011-12-04 HISTORY — DX: Other congenital malformations of aorta: Q25.49

## 2011-12-04 HISTORY — DX: Non-Hodgkin lymphoma, unspecified, unspecified site: C85.90

## 2011-12-04 HISTORY — DX: Congenital aneurysm of aorta: Q25.43

## 2011-12-04 HISTORY — DX: Gastro-esophageal reflux disease without esophagitis: K21.9

## 2011-12-04 LAB — COMPREHENSIVE METABOLIC PANEL
ALT: 14 U/L (ref 0–35)
Albumin: 3.8 g/dL (ref 3.5–5.2)
Alkaline Phosphatase: 83 U/L (ref 39–117)
BUN: 20 mg/dL (ref 6–23)
Potassium: 4.1 mEq/L (ref 3.5–5.1)
Sodium: 140 mEq/L (ref 135–145)
Total Protein: 7.7 g/dL (ref 6.0–8.3)

## 2011-12-04 LAB — CBC WITH DIFFERENTIAL/PLATELET
Basophils Relative: 1 % (ref 0–1)
Eosinophils Absolute: 0.3 10*3/uL (ref 0.0–0.7)
MCH: 28.1 pg (ref 26.0–34.0)
MCHC: 33.3 g/dL (ref 30.0–36.0)
Neutrophils Relative %: 56 % (ref 43–77)
Platelets: 171 10*3/uL (ref 150–400)

## 2011-12-04 LAB — POCT I-STAT, CHEM 8
BUN: 22 mg/dL (ref 6–23)
Calcium, Ion: 1.3 mmol/L (ref 1.13–1.30)
Chloride: 109 mEq/L (ref 96–112)
Creatinine, Ser: 1.1 mg/dL (ref 0.50–1.10)
Glucose, Bld: 117 mg/dL — ABNORMAL HIGH (ref 70–99)

## 2011-12-04 LAB — PROTIME-INR
INR: 2.88 — ABNORMAL HIGH (ref 0.00–1.49)
Prothrombin Time: 28.7 seconds — ABNORMAL HIGH (ref 11.6–15.2)

## 2011-12-04 LAB — APTT: aPTT: 41 seconds — ABNORMAL HIGH (ref 24–37)

## 2011-12-04 LAB — URINALYSIS, ROUTINE W REFLEX MICROSCOPIC
Bilirubin Urine: NEGATIVE
Nitrite: NEGATIVE
Specific Gravity, Urine: 1.015 (ref 1.005–1.030)
pH: 5 (ref 5.0–8.0)

## 2011-12-04 LAB — LACTIC ACID, PLASMA: Lactic Acid, Venous: 0.9 mmol/L (ref 0.5–2.2)

## 2011-12-04 MED ORDER — IOHEXOL 300 MG/ML  SOLN
100.0000 mL | Freq: Once | INTRAMUSCULAR | Status: AC | PRN
  Administered 2011-12-04: 100 mL via INTRAVENOUS

## 2011-12-04 NOTE — ED Notes (Addendum)
Patient voided x2,she was placed on bedpan.

## 2011-12-04 NOTE — ED Notes (Signed)
Pt is back in room from radiology 

## 2011-12-04 NOTE — ED Notes (Addendum)
Pt waiting for ED MD to evaluate. Pt remains stable, on cardiac monitor, requesting food. MD aware patient continues need to been seen.

## 2011-12-04 NOTE — ED Provider Notes (Signed)
CT results reviewed and discussed with Dr. Ethelda Chick and with resident, shared with patient.  Case discussed with Dr. Edilia Bo (Vascular surgery)--he will see in ED.  Jimmye Norman, NP 12/04/11 2350

## 2011-12-04 NOTE — ED Notes (Signed)
Pt here via EMS from [facility]. Pt c/o LLQ abdominal pain and mass which developed over the last month. 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

## 2011-12-04 NOTE — Progress Notes (Signed)
This 71 year old woman with a long history of atrial fibrillation, chronic Coumadin anticoagulation, prior embolic strokes, high blood pressure, diabetes, dyslipidemia, comes in for a routine followup office visit.  She is complaining of severe lower abdominal discomfort.  She is followed by [facility] care who saw her 2 weeks ago and ordered a KUB of the abdomen which showed only stool.  Today she comes in complaining of lower abdominal pain.  Examination reveals a large suprapubic pulsatile mass.  There is an audible bruit.  She has had previous stroke of peripheral vascular surgery remotely.  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  We will send her to come the emergency room for further evaluation.  If her renal function is satisfactory she will need a CT scan of the abdomen with contrast.

## 2011-12-04 NOTE — H&P (Signed)
Vascular and Vein Specialist of [facility]  Patient name: Casey [name] MRN: [id number] DOB: 27-Jan-1941 Sex: female  CC: pulsatile mass left lower abdomen  HPI: Casey [name] is a 71 y.o. female who underwent a right to left fem-fem bypass graft by Dr. Hart Rochester in 2010. I believe the patient had a previous left femoropopliteal bypass graft with vein. She was last seen in follow up by Dr. Hart Rochester in September of 2012. She was to return in 6 months but apparently did not return at that time. She had a routine visit with Dr. Patty Sermons today. She noted that she had been having some lower abdominal discomfort and on exam was found a to have a large pulsatile mass in the left lower quadrant. She was sent to the emergency department.  She is a very poor historian. She believes that she has had this mass for approximately one month. It has been gradually enlarging. He denies fever or chills. She does not remember any sudden acute onset of symptoms. She is on chronic Coumadin therapy for atrial fibrillation. She denies rest pain or nonhealing ulcers.  Past Medical History  Diagnosis Date  . Chronic atrial fibrillation   . Stroke   . Hypertension   . Hypercholesterolemia   . Diabetes mellitus     Family History  Problem Relation Age of Onset  . Cancer Mother   . Stroke Mother   . Stroke Father     SOCIAL HISTORY: History  Substance Use Topics  . Smoking status: Former Smoker    Types: Cigarettes    Quit date: 01/29/2001  . Smokeless tobacco: Never Used  . Alcohol Use: No    Allergies  Allergen Reactions  . Penicillins     Current Facility-Administered Medications  Medication Dose Route Frequency Provider Last Rate Last Dose  . [COMPLETED] iohexol (OMNIPAQUE) 300 MG/ML solution 100 mL  100 mL Intravenous Once PRN Medication Radiologist, MD   100 mL at 12/04/11 2113   Current Outpatient Prescriptions  Medication Sig Dispense Refill  . allopurinol (ZYLOPRIM) 100 MG tablet Take  100 mg by mouth daily.        Marland Kitchen amLODipine (NORVASC) 5 MG tablet Take 5 mg by mouth daily.        Marland Kitchen aspirin 81 MG tablet Take 1 tablet (81 mg total) by mouth daily.  30 tablet  11  . digoxin (LANOXIN) 0.125 MG tablet Take 125 mcg by mouth daily.        Marland Kitchen donepezil (ARICEPT) 10 MG tablet Take 10 mg by mouth daily.      . furosemide (LASIX) 20 MG tablet Take 20 mg by mouth daily.        . insulin aspart (NOVOLOG) 100 UNIT/ML injection Inject 4 Units into the skin 3 (three) times daily with meals.  1 vial  0  . insulin glargine (LANTUS) 100 UNIT/ML injection Inject 12 Units into the skin at bedtime.  10 mL  0  . metFORMIN (GLUCOPHAGE) 500 MG tablet Take 1 tablet (500 mg total) by mouth 2 (two) times daily with a meal.  60 tablet  0  . metoprolol succinate (TOPROL-XL) 50 MG 24 hr tablet Take 50 mg by mouth 2 (two) times daily. Take with or immediately following a meal.      . oxybutynin (DITROPAN) 5 MG tablet Take 5-10 mg by mouth 2 (two) times daily. Take 1 tab in the am and 2 tabs at night      . polyethylene glycol powder (  GLYCOLAX/MIRALAX) powder Take 17 g by mouth daily.       . potassium chloride (K-DUR,KLOR-CON) 10 MEQ tablet Take 10 mEq by mouth daily.        . simvastatin (ZOCOR) 20 MG tablet Take 1 tablet (20 mg total) by mouth at bedtime.  30 tablet  11  . traMADol (ULTRAM) 50 MG tablet Take 50 mg by mouth 3 (three) times daily as needed. For pain      . warfarin (COUMADIN) 5 MG tablet Take 2.5-5 mg by mouth daily. 1 tablet daily except 1/2 tablet on Tuesdays and Saturdays.      . [DISCONTINUED] warfarin (COUMADIN) 5 MG tablet Take 1 tablet (5 mg total) by mouth as directed. 1 tablet daily except 1/2 tablet on Tuesdays and Saturdays.  30 tablet  3    REVIEW OF SYSTEMS: Arly.Keller ] denotes positive finding; [  ] denotes negative finding CARDIOVASCULAR:  [ ]  chest pain   [ ]  chest pressure   [ ]  palpitations   [ ]  orthopnea   [ ]  dyspnea on exertion   [ ]  claudication   [ ]  rest pain   [ ]  DVT   [  ] phlebitis PULMONARY:   [ ]  productive cough   [ ]  asthma   [ ]  wheezing NEUROLOGIC:   [ ]  weakness  [ ]  paresthesias  [ ]  aphasia  [ ]  amaurosis  [ ]  dizziness HEMATOLOGIC:   [ ]  bleeding problems   [ ]  clotting disorders MUSCULOSKELETAL:  Arly.Keller ] joint pain   [ ]  joint swelling [ ]  leg swelling GASTROINTESTINAL: [ ]   blood in stool  [ ]   hematemesis GENITOURINARY:  [ ]   dysuria  [ ]   hematuria PSYCHIATRIC:  [ ]  history of major depression INTEGUMENTARY:  [ ]  rashes  [ ]  ulcers CONSTITUTIONAL:  [ ]  fever   [ ]  chills  PHYSICAL EXAM: Filed Vitals:   12/04/11 1651 12/04/11 1800 12/04/11 1916 12/04/11 2120  BP: 162/54 149/78 145/83 178/82  Pulse: 66 50 77 69  Temp: 97.7 F (36.5 C)  98 F (36.7 C) 97.9 F (36.6 C)  TempSrc: Oral  Oral Oral  Resp: 18 19 18 14   SpO2: 100% 100% 98% 99%   There is no height or weight on file to calculate BMI. GENERAL: The patient is a well-nourished female, in no acute distress. The vital signs are documented above. CARDIOVASCULAR: There is a regular rate and rhythm without significant murmur appreciated. I do not detect carotid bruits. She has a thrill in her left groin. She has a large pulsatile mass in the left lower quadrant. She has a palpable right femoral pulse. I cannot palpate pedal pulses. PULMONARY: There is good air exchange bilaterally without wheezing or rales. ABDOMEN: Soft and non-tender with normal pitched bowel sounds. I do not palpate an abdominal aortic aneurysm. MUSCULOSKELETAL: There are no major deformities or cyanosis. NEUROLOGIC: No focal weakness or paresthesias are detected. SKIN: There are no ulcers or rashes noted. PSYCHIATRIC: The patient has a normal affect.  DATA:  I have reviewed her CT scan. This shows a large pseudoaneurysm involving the left side of her right to left fem-fem bypass graft. I am unable to follow the graft all the way to the femoral artery on the left.this was a CT scan of the abdomen and pelvis and I am  not able to determine if her left pop bypass graft is patent.  Her INR is 2.88. Crt is normal.  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 Hart Rochester in 2010. Based on her exam it does not appear to be clearly infected. She is afebrile and is not warm. It appears that she's had this for close to a month and that he has been gradually enlarging. Even the size I think she will require exploration and possible removal of her graft or revision of her graft. We will hold her Coumadin and place her on Lovenox.  I will obtain ABIs and also a duplex of her left femoropopliteal bypass graft. We will tentatively plan surgery later in the week either Thursday or Friday once her INR is improved. I discussed the serious nature of her problem and the fact that this could become a limb threatening problem if her graft needs to be removed.  Kati Riggenbach S [facility] [place] Office: [phone]

## 2011-12-04 NOTE — ED Notes (Signed)
Patient transported to CT 

## 2011-12-04 NOTE — ED Provider Notes (Signed)
History     CSN: [id number]  Arrival date & time 12/04/11  1641   First MD Initiated Contact with Patient 12/04/11 1920      Chief Complaint  Patient presents with  . Abdominal Pain    (Consider location/radiation/quality/duration/timing/severity/associated sxs/prior treatment) HPI Sent here via EMS from cariology appointment with pulsating lower abdominal mass that has been gradually increasing in size over past few weeks.  Pain described as pressure, 7/10 in intensity. The location of the patient's problem is lower abdomen.  Modifying factors:  Worse with palpation.  Associated symptoms: none.  Past Medical History  Diagnosis Date  . Chronic atrial fibrillation   . Stroke   . Hypertension   . Hypercholesterolemia   . Diabetes mellitus     Past Surgical History  Procedure Date  . Femoral to femoral bypass graft      Family History  Problem Relation Age of Onset  . Cancer Mother   . Stroke Mother   . Stroke Father     History  Substance Use Topics  . Smoking status: Former Smoker    Types: Cigarettes    Quit date: 01/29/2001  . Smokeless tobacco: Never Used  . Alcohol Use: No    OB History    Grav Para Term Preterm Abortions TAB SAB Ect Mult Living                  Review of Systems Negative for respiratory distress, cough. Negative for vomiting, diarrhea.  All other systems reviewed and negative unless noted in HPI.    Allergies  Penicillins  Home Medications   Current Outpatient Rx  Name  Route  Sig  Dispense  Refill  . ALLOPURINOL 100 MG PO TABS   Oral   Take 100 mg by mouth daily.           Marland Kitchen AMLODIPINE BESYLATE 5 MG PO TABS   Oral   Take 5 mg by mouth daily.           . ASPIRIN 81 MG PO TABS   Oral   Take 1 tablet (81 mg total) by mouth daily.   30 tablet   11   . DIGOXIN 0.125 MG PO TABS   Oral   Take 125 mcg by mouth daily.           . DONEPEZIL HCL 10 MG PO TABS   Oral   Take 10 mg by mouth daily.         .  FUROSEMIDE 20 MG PO TABS   Oral   Take 20 mg by mouth daily.           . INSULIN ASPART 100 UNIT/ML [place] SOLN   Subcutaneous   Inject 4 Units into the skin 3 (three) times daily with meals.   1 vial   0   . INSULIN GLARGINE 100 UNIT/ML [place] SOLN   Subcutaneous   Inject 12 Units into the skin at bedtime.   10 mL   0   . METFORMIN HCL 500 MG PO TABS   Oral   Take 1 tablet (500 mg total) by mouth 2 (two) times daily with a meal.   60 tablet   0   . METOPROLOL SUCCINATE ER 50 MG PO TB24   Oral   Take 50 mg by mouth 2 (two) times daily. Take with or immediately following a meal.         . OXYBUTYNIN CHLORIDE 5 MG PO TABS  Oral   Take 5-10 mg by mouth 2 (two) times daily. Take 1 tab in the am and 2 tabs at night         . POLYETHYLENE GLYCOL 3350 PO POWD   Oral   Take 17 g by mouth daily.          Marland Kitchen POTASSIUM CHLORIDE CRYS ER 10 MEQ PO TBCR   Oral   Take 10 mEq by mouth daily.           Marland Kitchen SIMVASTATIN 20 MG PO TABS   Oral   Take 1 tablet (20 mg total) by mouth at bedtime.   30 tablet   11   . TRAMADOL HCL 50 MG PO TABS   Oral   Take 50 mg by mouth 3 (three) times daily as needed. For pain         . WARFARIN SODIUM 5 MG PO TABS   Oral   Take 2.5-5 mg by mouth daily. 1 tablet daily except 1/2 tablet on Tuesdays and Saturdays.           BP 145/83  Pulse 77  Temp 98 F (36.7 C) (Oral)  Resp 18  SpO2 98%  Physical Exam Nursing note and vitals reviewed.  Constitutional: Pt is alert and appears stated age. Oropharynx: Airway open without erythema or exudate. Respiratory: No respiratory distress. Equal breathing bilaterally. CV: Extremities warm and well perfused. Neuro: No motor nor sensory deficit. Head: Normocephalic and atraumatic. Eyes: No conjunctivitis, no scleral icterus. Neck: Supple, no mass. Chest: Non-tender. Abdomen: Lower abdominal mass with palpable pulsation. Tender to palpation.  MSK: Extremities are atraumatic without  deformity. Skin: No rash, no wounds.  ED Course  Procedures (including critical care time)  Labs Reviewed  COMPREHENSIVE METABOLIC PANEL - Abnormal; Notable for the following:    Glucose, Bld 115 (*)     GFR calc non Af Amer 56 (*)     GFR calc Af Amer 65 (*)     All other components within normal limits  URINALYSIS, ROUTINE W REFLEX MICROSCOPIC - Abnormal; Notable for the following:    APPearance HAZY (*)     All other components within normal limits  PROTIME-INR - Abnormal; Notable for the following:    Prothrombin Time 28.7 (*)     INR 2.88 (*)     All other components within normal limits  APTT - Abnormal; Notable for the following:    aPTT 41 (*)     All other components within normal limits  POCT I-STAT, CHEM 8 - Abnormal; Notable for the following:    Glucose, Bld 117 (*)     All other components within normal limits  GLUCOSE, CAPILLARY - Abnormal; Notable for the following:    Glucose-Capillary 141 (*)     All other components within normal limits  CBC WITH DIFFERENTIAL  LACTIC ACID, PLASMA  TYPE AND SCREEN  HEMOGLOBIN A1C   Ct Abdomen Pelvis W Contrast  12/04/2011  *RADIOLOGY REPORT*  Clinical Data: Left lower quadrant abdominal pain and enlarged mass.  CT ABDOMEN AND PELVIS WITH CONTRAST  Technique:  Multidetector CT imaging of the abdomen and pelvis was performed following the standard protocol during bolus administration of intravenous contrast.  Contrast: OMNIPAQUE IOHEXOL 300 MG/ML  SOLN  Comparison: None.  Findings:  There are extensive atherosclerotic calcifications within a normal caliber abdominal aorta.  The left common iliac artery occludes shortly after its origin (image 46, series 2). The left external iliac artery is  occluded throughout its course.  There is minimal opacification of the distal aspect of the left internal iliac vascular supply, likely via right to left transpelvic collaterals.  Post right to left fem-fem bypass grafting.  The right common  femoral anastomosis appeas widely patent.  There is a large (approximately 10.4 x 7.8 cm pseudoaneurysm involving the mid and distal aspect of the bypass graft within the left lower abdominal quadrant.  There is opacification of the left common femoral artery (image 74).  There are extensive atherosclerotic calcifications involving the origin of the major branch vessels of the abdominal aorta and particularly involving the proximal course of the SMA possibly resulting in hemodynamically significant narrowing.  The IMA remains patent.  Normal hepatic contour.  No discrete hepatic lesions.  Normal appearance of the gallbladder.  No intra or extrahepatic biliary ductal dilatation with the common bile duct measuring approximately 7.4 mm in greatest oblique coronal dimension (image 41, series 400).  No ascites.  There is symmetric enhancement and excretion of the bilateral kidneys.  Note is made of an approximately 3.2 cm hypoattenuating (4 HU) partially exophytic cyst arising from the inferior pole left kidney.  Note is made of a slightly lateral line of the right kidney.  No discrete right-sided renal lesions.  No definite renal stones.  No urinary obstruction.  No perinephric stranding.  Normal appearance of the bilateral adrenal glands, pancreas and spleen.  Colonic diverticulosis without evidence of diverticulitis.  The bowel is otherwise normal in course and caliber without wall thickening or evidence of obstruction.  Normal appearance of the appendix.  No pneumoperitoneum, pneumatosis or portal venous gas.  Partially calcified fibroid within an otherwise atrophic uterus. No discrete adnexal mass.  No free fluid in the pelvis.  Limited visualization of the lower thorax demonstrates bibasilar subsegmental atelectasis, left greater than right.  Cardiomegaly, in particular, there is enlargement of the right atrium.  No pericardial effusion.  No acute or aggressive osseous abnormalities.  There is partial sacralization  of the L5 vertebral body.  Lumbar spine degenerative change, worst at L4 - L5.  IMPRESSION: 1.  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  These findings are compatible with graft failure though note superimposed infection of the graft component is not excluded. Clinical correlation is advised.  2.  Extensive atherosclerotic calcifications within a normal caliber abdominal aorta.  Possible hemodynamically significant narrowing of the SMA.  The IMA remains patent.  Above findings discussed with Dr. Harmon Dun at 2131.   Original Report Authenticated By: Tacey Ruiz, MD      1. Hypercholesterolemia   2. Pseudoaneurysm       MDM  71 y.o. female here from cardiology clinic with concern for lower abdominal pulsating mass.  Pertinent past problems include a fib on coumadin, prior stroke, HTN, HL, DM on insulin, prior fem to fem bypass graft. Pt in stable condition. This problem has been gradually getting worse over past few weeks. Plan for labs and imaging. Pending results will move pt to CDU.   Medical Decision Making discussed with ED attending Doug Sou, MD          Charm Barges, MD 12/05/11 [phone]

## 2011-12-05 ENCOUNTER — Encounter (HOSPITAL_COMMUNITY): Payer: Self-pay | Admitting: Physician Assistant

## 2011-12-05 DIAGNOSIS — Z01810 Encounter for preprocedural cardiovascular examination: Secondary | ICD-10-CM

## 2011-12-05 DIAGNOSIS — I4891 Unspecified atrial fibrillation: Secondary | ICD-10-CM

## 2011-12-05 DIAGNOSIS — I729 Aneurysm of unspecified site: Secondary | ICD-10-CM

## 2011-12-05 DIAGNOSIS — T82898A Other specified complication of vascular prosthetic devices, implants and grafts, initial encounter: Secondary | ICD-10-CM

## 2011-12-05 DIAGNOSIS — I369 Nonrheumatic tricuspid valve disorder, unspecified: Secondary | ICD-10-CM

## 2011-12-05 DIAGNOSIS — Z48812 Encounter for surgical aftercare following surgery on the circulatory system: Secondary | ICD-10-CM

## 2011-12-05 LAB — COMPREHENSIVE METABOLIC PANEL
ALT: 13 U/L (ref 0–35)
AST: 20 U/L (ref 0–37)
CO2: 25 mEq/L (ref 19–32)
Chloride: 100 mEq/L (ref 96–112)
GFR calc non Af Amer: 56 mL/min — ABNORMAL LOW (ref >90)
Sodium: 137 mEq/L (ref 135–145)
Total Bilirubin: 0.6 mg/dL (ref 0.3–1.2)

## 2011-12-05 LAB — GLUCOSE, CAPILLARY: Glucose-Capillary: 151 mg/dL — ABNORMAL HIGH (ref 70–99)

## 2011-12-05 LAB — CBC
MCV: 83.9 fL (ref 78.0–100.0)
Platelets: 155 10*3/uL (ref 150–400)
RBC: 4.34 MIL/uL (ref 3.87–5.11)
WBC: 7.2 10*3/uL (ref 4.0–10.5)

## 2011-12-05 LAB — PROTIME-INR
INR: 1.43 (ref 0.00–1.49)
Prothrombin Time: 17.1 seconds — ABNORMAL HIGH (ref 11.6–15.2)

## 2011-12-05 LAB — HEMOGLOBIN A1C
Hgb A1c MFr Bld: 8.3 % — ABNORMAL HIGH (ref <5.7)
Mean Plasma Glucose: 192 mg/dL — ABNORMAL HIGH (ref <117)

## 2011-12-05 MED ORDER — ONDANSETRON HCL 4 MG/2ML IJ SOLN: 4.0000 mg | Freq: Four times a day (QID) | INTRAMUSCULAR | Status: DC | PRN

## 2011-12-05 MED ORDER — AMLODIPINE BESYLATE 5 MG PO TABS
5.0000 mg | ORAL_TABLET | Freq: Every day | ORAL | Status: DC
  Administered 2011-12-06: 5 mg via ORAL
  Filled 2011-12-05: qty 1

## 2011-12-05 MED ORDER — VITAMIN K1 10 MG/ML IJ SOLN
2.0000 mg | Freq: Once | INTRAVENOUS | Status: AC
  Administered 2011-12-05: 2 mg via INTRAVENOUS
  Filled 2011-12-05 (×2): qty 0.2

## 2011-12-05 MED ORDER — CEFAZOLIN SODIUM 1-5 GM-% IV SOLN
1.0000 g | INTRAVENOUS | Status: AC
  Administered 2011-12-05 – 2011-12-06 (×2): 1 g via INTRAVENOUS
  Filled 2011-12-05: qty 50

## 2011-12-05 MED ORDER — ACETAMINOPHEN 650 MG RE SUPP: 325.0000 mg | RECTAL | Status: DC | PRN

## 2011-12-05 MED ORDER — INSULIN ASPART 100 UNIT/ML ~~LOC~~ SOLN
4.0000 [IU] | Freq: Three times a day (TID) | SUBCUTANEOUS | Status: DC
  Administered 2011-12-05 (×2): 4 [IU] via SUBCUTANEOUS

## 2011-12-05 MED ORDER — POLYETHYLENE GLYCOL 3350 17 G PO PACK
17.0000 g | PACK | Freq: Every day | ORAL | Status: DC
  Administered 2011-12-07: 17 g via ORAL
  Filled 2011-12-05 (×3): qty 1

## 2011-12-05 MED ORDER — PANTOPRAZOLE SODIUM 40 MG PO TBEC
40.0000 mg | DELAYED_RELEASE_TABLET | Freq: Every day | ORAL | Status: DC
  Administered 2011-12-05: 40 mg via ORAL
  Filled 2011-12-05: qty 1

## 2011-12-05 MED ORDER — POTASSIUM CHLORIDE CRYS ER 10 MEQ PO TBCR
10.0000 meq | EXTENDED_RELEASE_TABLET | Freq: Every day | ORAL | Status: DC
  Administered 2011-12-05: 10 meq via ORAL
  Filled 2011-12-05 (×2): qty 1

## 2011-12-05 MED ORDER — GUAIFENESIN-DM 100-10 MG/5ML PO SYRP: 15.0000 mL | ORAL_SOLUTION | ORAL | Status: DC | PRN

## 2011-12-05 MED ORDER — OXYCODONE-ACETAMINOPHEN 5-325 MG PO TABS
1.0000 | ORAL_TABLET | ORAL | Status: DC | PRN
  Administered 2011-12-05: 2 via ORAL
  Filled 2011-12-05: qty 2

## 2011-12-05 MED ORDER — DIGOXIN 125 MCG PO TABS
125.0000 ug | ORAL_TABLET | Freq: Every day | ORAL | Status: DC
  Administered 2011-12-05: 125 ug via ORAL
  Filled 2011-12-05 (×2): qty 1

## 2011-12-05 MED ORDER — SIMVASTATIN 20 MG PO TABS
20.0000 mg | ORAL_TABLET | Freq: Every day | ORAL | Status: DC
  Administered 2011-12-05: 20 mg via ORAL
  Filled 2011-12-05 (×2): qty 1

## 2011-12-05 MED ORDER — INSULIN GLARGINE 100 UNIT/ML ~~LOC~~ SOLN
12.0000 [IU] | Freq: Every day | SUBCUTANEOUS | Status: DC
  Administered 2011-12-05: 12 [IU] via SUBCUTANEOUS

## 2011-12-05 MED ORDER — AMLODIPINE BESYLATE 5 MG PO TABS
5.0000 mg | ORAL_TABLET | Freq: Every day | ORAL | Status: DC
  Administered 2011-12-05: 5 mg via ORAL
  Filled 2011-12-05: qty 1

## 2011-12-05 MED ORDER — METOPROLOL SUCCINATE ER 50 MG PO TB24
50.0000 mg | ORAL_TABLET | Freq: Two times a day (BID) | ORAL | Status: DC
  Administered 2011-12-05 (×2): 50 mg via ORAL
  Filled 2011-12-05 (×4): qty 1

## 2011-12-05 MED ORDER — INSULIN ASPART 100 UNIT/ML ~~LOC~~ SOLN
0.0000 [IU] | Freq: Three times a day (TID) | SUBCUTANEOUS | Status: DC
  Administered 2011-12-05: 3 [IU] via SUBCUTANEOUS
  Administered 2011-12-05 – 2011-12-07 (×3): 2 [IU] via SUBCUTANEOUS
  Administered 2011-12-08: 5 [IU] via SUBCUTANEOUS
  Administered 2011-12-08 – 2011-12-09 (×2): 2 [IU] via SUBCUTANEOUS
  Administered 2011-12-09 (×2): 3 [IU] via SUBCUTANEOUS
  Administered 2011-12-10 – 2011-12-11 (×3): 2 [IU] via SUBCUTANEOUS

## 2011-12-05 MED ORDER — POLYETHYLENE GLYCOL 3350 17 GM/SCOOP PO POWD
17.0000 g | Freq: Every day | ORAL | Status: DC
  Filled 2011-12-05: qty 255

## 2011-12-05 MED ORDER — METOPROLOL SUCCINATE ER 50 MG PO TB24
50.0000 mg | ORAL_TABLET | Freq: Every day | ORAL | Status: DC
  Filled 2011-12-05: qty 1

## 2011-12-05 MED ORDER — DONEPEZIL HCL 10 MG PO TABS
10.0000 mg | ORAL_TABLET | Freq: Every day | ORAL | Status: DC
  Administered 2011-12-05: 10 mg via ORAL
  Filled 2011-12-05 (×2): qty 1

## 2011-12-05 MED ORDER — POTASSIUM CHLORIDE CRYS ER 20 MEQ PO TBCR: 20.0000 meq | EXTENDED_RELEASE_TABLET | Freq: Once | ORAL | Status: DC

## 2011-12-05 MED ORDER — ASPIRIN 81 MG PO CHEW
81.0000 mg | CHEWABLE_TABLET | Freq: Every day | ORAL | Status: DC
  Administered 2011-12-05: 81 mg via ORAL
  Filled 2011-12-05 (×2): qty 1

## 2011-12-05 MED ORDER — TRAMADOL HCL 50 MG PO TABS: 50.0000 mg | ORAL_TABLET | Freq: Three times a day (TID) | ORAL | Status: DC | PRN

## 2011-12-05 MED ORDER — HYDRALAZINE HCL 20 MG/ML IJ SOLN
10.0000 mg | INTRAMUSCULAR | Status: DC | PRN
  Filled 2011-12-05: qty 0.5

## 2011-12-05 MED ORDER — OXYBUTYNIN CHLORIDE 5 MG PO TABS
5.0000 mg | ORAL_TABLET | Freq: Two times a day (BID) | ORAL | Status: DC
  Filled 2011-12-05 (×2): qty 2

## 2011-12-05 MED ORDER — PHENOL 1.4 % MT LIQD
1.0000 | OROMUCOSAL | Status: DC | PRN
  Filled 2011-12-05: qty 177

## 2011-12-05 MED ORDER — LABETALOL HCL 5 MG/ML IV SOLN
10.0000 mg | INTRAVENOUS | Status: DC | PRN
  Filled 2011-12-05: qty 4

## 2011-12-05 MED ORDER — METOPROLOL TARTRATE 1 MG/ML IV SOLN: 2.0000 mg | INTRAVENOUS | Status: DC | PRN

## 2011-12-05 MED ORDER — ALLOPURINOL 100 MG PO TABS
100.0000 mg | ORAL_TABLET | Freq: Every day | ORAL | Status: DC
  Administered 2011-12-05: 100 mg via ORAL
  Filled 2011-12-05 (×2): qty 1

## 2011-12-05 MED ORDER — LABETALOL HCL 5 MG/ML IV SOLN
10.0000 mg | Freq: Once | INTRAVENOUS | Status: AC
  Administered 2011-12-05: 10 mg via INTRAVENOUS
  Filled 2011-12-05: qty 4

## 2011-12-05 MED ORDER — ACETAMINOPHEN 325 MG PO TABS: 325.0000 mg | ORAL_TABLET | ORAL | Status: DC | PRN

## 2011-12-05 MED ORDER — MORPHINE SULFATE 4 MG/ML IJ SOLN
5.0000 mg | Freq: Once | INTRAMUSCULAR | Status: AC
  Administered 2011-12-05: 5 mg via INTRAVENOUS
  Filled 2011-12-05: qty 2

## 2011-12-05 MED ORDER — ENOXAPARIN SODIUM 80 MG/0.8ML ~~LOC~~ SOLN
70.0000 mg | Freq: Two times a day (BID) | SUBCUTANEOUS | Status: DC
  Administered 2011-12-05: 70 mg via SUBCUTANEOUS
  Filled 2011-12-05 (×4): qty 0.8

## 2011-12-05 MED ORDER — ALUM & MAG HYDROXIDE-SIMETH 200-200-20 MG/5ML PO SUSP: 15.0000 mL | ORAL | Status: DC | PRN

## 2011-12-05 MED ORDER — OXYBUTYNIN CHLORIDE 5 MG PO TABS
10.0000 mg | ORAL_TABLET | Freq: Every day | ORAL | Status: DC
  Administered 2011-12-05 – 2011-12-10 (×6): 10 mg via ORAL
  Filled 2011-12-05 (×8): qty 2

## 2011-12-05 MED ORDER — MORPHINE SULFATE 4 MG/ML IJ SOLN: 4.0000 mg | INTRAMUSCULAR | Status: DC | PRN

## 2011-12-05 MED ORDER — OXYBUTYNIN CHLORIDE 5 MG PO TABS
5.0000 mg | ORAL_TABLET | Freq: Every day | ORAL | Status: DC
  Administered 2011-12-07 – 2011-12-11 (×5): 5 mg via ORAL
  Filled 2011-12-05 (×9): qty 1

## 2011-12-05 MED ORDER — METFORMIN HCL 500 MG PO TABS
500.0000 mg | ORAL_TABLET | Freq: Two times a day (BID) | ORAL | Status: DC
  Administered 2011-12-05: 500 mg via ORAL
  Filled 2011-12-05 (×3): qty 1

## 2011-12-05 MED ORDER — FUROSEMIDE 20 MG PO TABS
20.0000 mg | ORAL_TABLET | Freq: Every day | ORAL | Status: DC
  Administered 2011-12-05: 20 mg via ORAL
  Filled 2011-12-05 (×2): qty 1

## 2011-12-05 NOTE — Progress Notes (Signed)
I have personally reviewed her two-dimensional echocardiogram.  Her ejection fraction is 45%.  She is cleared from a cardiology standpoint for surgery tomorrow.

## 2011-12-05 NOTE — Progress Notes (Signed)
  Echocardiogram 2D Echocardiogram has been performed.  Casey [name] 12/05/2011, 11:56 AM

## 2011-12-05 NOTE — Progress Notes (Signed)
VASCULAR PROGRESS NOTE  SUBJECTIVE: Patient does not complaining of leg pain this morning.  PHYSICAL EXAM: Filed Vitals:   12/05/11 0435 12/05/11 0505 12/05/11 0605 12/05/11 0700  BP: 152/69 159/75 128/66 158/71  Pulse: 80 68 88 79  Temp: 97.7 F (36.5 C) 97.9 F (36.6 C) 97.9 F (36.6 C) 98.2 F (36.8 C)  TempSrc: Oral Oral Oral Oral  Resp: 24 22 16 16   Height: 5\' 5"  (1.651 m)     Weight: 158 lb (71.668 kg)     SpO2: 100%  100%    No change in the left lower quadrant pseudoaneurysm. Both feet appear adequately perfused.  LABS: Lab Results  Component Value Date   WBC 6.6 12/04/2011   HGB 13.9 12/04/2011   HCT 41.0 12/04/2011   MCV 84.4 12/04/2011   PLT 171 12/04/2011   Lab Results  Component Value Date   CREATININE 1.10 12/04/2011   Lab Results  Component Value Date   INR 2.88* 12/04/2011   Patient is receiving FFP and follow up INR is pending. 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 Is not clear from exam or CT scan whether or not the left femoropopliteal bypass graft is patent.The best I can tell, she has likely had the pseudoaneurysm for approximately one month. However, given its size I think it needs to be addressed and I have recommended elective repair tomorrow. INR last night was 2.88 and she is receiving fresh frozen plasma and also received 2 mg of vitamin K. Follow up INR is pending. In anticipation of surgery tomorrow I have ordered a duplex of her left femoropopliteal bypass graft and ABIs. Her cardiologist is Dr. Ronny Flurry and we will also have cardiology see the patient for preoperative evaluation.  2. Although this does not appear to be infected by exam and she is not febrile, certainly is possible that on exploration we will find evidence of infection. In that case the graft would have to be removed with vein patch  angioplasty of the femoral arteries. I've explained that if the graft is removed this could result in a limb threatening situation.  3. If the graft is not infected I will have to try to determine how to maintain perfusion to the left leg after the pseudoaneurysm is addressed and this could potentially be a significant problem. This is a very large pseudoaneurysm  And she will be at high risk for wound complications related to this which could result in graft infection. I think I will have to determine how best to approach this if it is not infected intraoperatively. This could potentially require revision of her fem-fem graft or potentially even left axillofemoral bypass grafting. I've explained the risks including but not limited to bleeding, wound healing problems, limb loss or other unpredictable medical problems. She states that her family is in [place] and they are not available.  Waverly Ferrari, MD, FACS Beeper: [phone] 12/05/2011

## 2011-12-05 NOTE — Consult Note (Signed)
CARDIOLOGY CONSULT NOTE  Patient ID: Casey [name], MRN: [id number], DOB/AGE: 71/31/1942 71 y.o. Admit date: 12/04/2011   Date of Consult: 12/05/2011 Primary Physician: Vida Roller, MD Primary Cardiologist: Patty Sermons  Chief Complaint: lower abdominal pain Reason for Consult: pre-op clearance  HPI: Casey [name] is a 71 y/o F with hx of chronic atrial fib, stroke, HTN, DM, PVD who underwent R->L fem-fem bpg 2010. She was seen yesterday by Dr. Patty Sermons for routine cardiology followup and reported lower abd pain and gradually enlarging pulsatile LLQ mass for the last month. In October she had been treated for constipation after KUB by [facility] showed moderate stool burden. Dr. 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 She will require exploration and possible removal of her graft or revision. Coumadin has been held with pharmacy consult for Lovenox when INR<2. 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 She denies any cardiac symptoms including CP, SOB/DOE, diaphoresis, nausea, palpitations, orthopnea, LEE or syncope. She only reports occasional LLQ abd soreness particular when she goes to change positions. No fever or chills.  Past Medical History  Diagnosis Date  . Chronic atrial fibrillation   . Stroke   . Hypertension   . Hypercholesterolemia   . Diabetes mellitus   . Sinus of Valsalva aneurysm     a. By 2D echo 05/2011.  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      Most Recent Cardiac Studies: 2D Echo 06/19/11 Study Conclusions - Left ventricle: The cavity size was normal. Wall thickness was increased in a pattern of mild LVH. Systolic function was normal. The estimated ejection fraction was in the range  of 50% to 55%. Wall motion was normal; there were no regional wall motion abnormalities. - Left atrium: The atrium was moderately dilated. - Right atrium: The atrium was mildly dilated. - Pulmonary arteries: Systolic pressure was mildly to moderately increased. PA peak pressure: 45mm Hg (S). - Pericardium, extracardiac: A trivial pericardial effusion was identified. 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   Surgical History:  Past Surgical History  Procedure Date  . Femoral to femoral bypass graft       Home Meds: Prior to Admission medications   Medication Sig Start Date End Date Taking? Authorizing Provider  allopurinol (ZYLOPRIM) 100 MG tablet Take 100 mg by mouth daily.     Yes Historical Provider, MD  amLODipine (NORVASC) 5 MG tablet Take 5 mg by mouth daily.     Yes Historical Provider, MD  aspirin 81 MG tablet Take 1 tablet (81 mg total) by mouth daily. 02/23/11  Yes Cassell Clement, MD  digoxin (LANOXIN) 0.125 MG tablet Take 125 mcg by mouth daily.     Yes Historical Provider, MD  donepezil (ARICEPT) 10 MG tablet Take 10 mg by mouth daily.   Yes Historical Provider, MD  furosemide (LASIX) 20 MG tablet Take 20 mg by mouth daily.     Yes Historical Provider, MD  insulin aspart (NOVOLOG) 100 UNIT/ML injection Inject 4 Units into the skin 3 (three) times daily with meals. 06/21/11 06/20/12 Yes Shanker Levora Dredge, MD  insulin glargine (LANTUS) 100 UNIT/ML injection Inject 12 Units into the skin at bedtime. 06/21/11  06/20/12 Yes Shanker Levora Dredge, MD  metFORMIN (GLUCOPHAGE) 500 MG tablet Take 1 tablet (500 mg total) by mouth 2 (two) times daily with a meal. 06/21/11 06/20/12 Yes Shanker Levora Dredge, MD  metoprolol succinate (TOPROL-XL) 50 MG 24 hr tablet Take 50 mg by mouth 2 (two) times daily. Take with or immediately following a meal.   Yes Historical Provider, MD  oxybutynin (DITROPAN) 5 MG tablet Take 5-10 mg by  mouth 2 (two) times daily. Take 1 tab in the am and 2 tabs at night   Yes Historical Provider, MD  polyethylene glycol powder (GLYCOLAX/MIRALAX) powder Take 17 g by mouth daily.  11/13/11  Yes Historical Provider, MD  potassium chloride (K-DUR,KLOR-CON) 10 MEQ tablet Take 10 mEq by mouth daily.     Yes Historical Provider, MD  simvastatin (ZOCOR) 20 MG tablet Take 1 tablet (20 mg total) by mouth at bedtime. 06/22/11  Yes Cassell Clement, MD  traMADol (ULTRAM) 50 MG tablet Take 50 mg by mouth 3 (three) times daily as needed. For pain   Yes Historical Provider, MD  warfarin (COUMADIN) 5 MG tablet Take 2.5-5 mg by mouth daily. 1 tablet daily except 1/2 tablet on Tuesdays and Saturdays. 02/07/11  Yes Cassell Clement, MD    Inpatient Medications:     . allopurinol  100 mg Oral Daily  . amLODipine  5 mg Oral Daily  . aspirin  81 mg Oral Daily  .  ceFAZolin (ANCEF) IV  1 g Intravenous On Call  . digoxin  125 mcg Oral Daily  . donepezil  10 mg Oral Daily  . furosemide  20 mg Oral Daily  . insulin aspart  0-15 Units Subcutaneous TID WC  . insulin aspart  4 Units Subcutaneous TID WC  . insulin glargine  12 Units Subcutaneous QHS  . [COMPLETED] labetalol  10 mg Intravenous Once  . metFORMIN  500 mg Oral BID WC  . metoprolol succinate  50 mg Oral BID  . [COMPLETED]  morphine injection  5 mg Intravenous Once  . oxybutynin  10 mg Oral QHS  . oxybutynin  5 mg Oral Daily  . pantoprazole  40 mg Oral Daily  . phytonadione (VITAMIN K) IV  2 mg Intravenous Once  . polyethylene glycol  17 g Oral Daily  . potassium chloride  10 mEq Oral Daily  . potassium chloride  20-40 mEq Oral Once  . simvastatin  20 mg Oral QHS  . [DISCONTINUED] oxybutynin  5-10 mg Oral BID  . [DISCONTINUED] polyethylene glycol powder  17 g Oral Daily    Allergies:  Allergies  Allergen Reactions  . Penicillins     History   Social History  . Marital Status: Single    Spouse Name: N/A    Number of Children: N/A  . Years  of Education: N/A   Occupational History  . Not on file.   Social History Main Topics  . Smoking status: Former Smoker    Types: Cigarettes    Quit date: 01/29/2001  . Smokeless tobacco: Never Used  . Alcohol Use: No  . Drug Use: No  . Sexually Active: No   Other Topics Concern  . Not on file   Social History Narrative  . No narrative on file     Family History  Problem Relation Age of Onset  . Cancer Mother   . Stroke Mother   . Stroke Father      Review of Systems: General: negative for chills, fever, night sweats  Cardiovascular: see above Dermatogical: negative for rash but enlarging mass as above Respiratory: negative for cough or wheezing Urologic: negative for hematuria Abdominal: negative for nausea, vomiting, diarrhea, bright red blood per rectum, melena, or hematemesis Neurologic: negative for visual changes, syncope, or dizziness All other systems reviewed and are otherwise negative except as noted above.  Labs:  Lab Results  Component Value Date   WBC 6.6 12/04/2011   HGB 13.9 12/04/2011   HCT 41.0 12/04/2011   MCV 84.4 12/04/2011   PLT 171 12/04/2011     Lab 12/04/11 2039 12/04/11 1835  NA 143 --  K 4.3 --  CL 109 --  CO2 -- 25  BUN 22 --  CREATININE 1.10 --  CALCIUM -- 10.0  PROT -- 7.7  BILITOT -- 0.4  ALKPHOS -- 83  ALT -- 14  AST -- 20  GLUCOSE 117* --    Radiology/Studies:  Ct Abdomen Pelvis W Contrast 12/04/2011  *RADIOLOGY REPORT*  Clinical Data: Left lower quadrant abdominal pain and enlarged mass.  CT ABDOMEN AND PELVIS WITH CONTRAST  Technique:  Multidetector CT imaging of the abdomen and pelvis was performed following the standard protocol during bolus administration of intravenous contrast.  Contrast: OMNIPAQUE IOHEXOL 300 MG/ML  SOLN  Comparison: None.  Findings:  There are extensive atherosclerotic calcifications within a normal caliber abdominal aorta.  The left common iliac artery occludes shortly after its origin (image  46, series 2). The left external iliac artery is occluded throughout its course.  There is minimal opacification of the distal aspect of the left internal iliac vascular supply, likely via right to left transpelvic collaterals.  Post right to left fem-fem bypass grafting.  The right common femoral anastomosis appeas widely patent.  There is a large (approximately 10.4 x 7.8 cm pseudoaneurysm involving the mid and distal aspect of the bypass graft within the left lower abdominal quadrant.  There is opacification of the left common femoral artery (image 74).  There are extensive atherosclerotic calcifications involving the origin of the major branch vessels of the abdominal aorta and particularly involving the proximal course of the SMA possibly resulting in hemodynamically significant narrowing.  The IMA remains patent.  Normal hepatic contour.  No discrete hepatic lesions.  Normal appearance of the gallbladder.  No intra or extrahepatic biliary ductal dilatation with the common bile duct measuring approximately 7.4 mm in greatest oblique coronal dimension (image 41, series 400).  No ascites.  There is symmetric enhancement and excretion of the bilateral kidneys.  Note is made of an approximately 3.2 cm hypoattenuating (4 HU) partially exophytic cyst arising from the inferior pole left kidney.  Note is made of a slightly lateral line of the right kidney.  No discrete right-sided renal lesions.  No definite renal stones.  No urinary obstruction.  No perinephric stranding.  Normal appearance of the bilateral adrenal glands, pancreas and spleen.  Colonic diverticulosis without evidence of diverticulitis.  The bowel is otherwise normal in course and caliber without wall thickening or evidence of obstruction.  Normal appearance of the appendix.  No pneumoperitoneum, pneumatosis or portal venous gas.  Partially calcified fibroid within an otherwise atrophic uterus. No discrete adnexal mass.  No free fluid in the pelvis.   Limited visualization of the lower thorax demonstrates bibasilar subsegmental atelectasis, left greater than right.  Cardiomegaly, in particular, there is enlargement of the right atrium.  No pericardial effusion.  No acute or aggressive osseous abnormalities.  There is partial sacralization of the L5 vertebral  body.  Lumbar spine degenerative change, worst at L4 - L5.  IMPRESSION: 1.  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  These findings are compatible with graft failure though note superimposed infection of the graft component is not excluded. Clinical correlation is advised.  2.  Extensive atherosclerotic calcifications within a normal caliber abdominal aorta.  Possible hemodynamically significant narrowing of the SMA.  The IMA remains patent.  Above findings discussed with Dr. Harmon Dun at 2131.   Original Report Authenticated By: Tacey Ruiz, MD    EKG: atrial fib 68bpm left axis deviation, diffuse ST & T wave abnormality similar to prior tracing (reviewed with Dr. Patty Sermons)  Physical Exam: Blood pressure 138/70, pulse 74, temperature 98.4 F (36.9 C), temperature source Oral, resp. rate 16, height 5\' 5"  (1.651 m), weight 158 lb (71.668 kg), SpO2 100.00%. General: Well developed AAF in no acute distress. Head: Normocephalic, atraumatic, sclera non-icteric, no xanthomas, nares are without discharge.  Neck: Bilateral carotid bruits R>L. JVD not elevated. Lungs: Clear bilaterally to auscultation without wheezes, rales, or rhonchi. Breathing is unlabored. Heart: Irregularly irregular but rate controlled, with S1 S2. No murmurs, rubs, or gallops appreciated. Abdomen: General abdomen is soft, nontender and nondistented with normoactive bowel sounds. However, she has a very firm palpable LLQ mass about 2/3 the size of a bowling ball that is pulsatile in nature with a palpable thrill. Msk:  Strength and tone appear  normal for age. Extremities: No clubbing or cyanosis. Tr LLE edema.  Distal pedal pulses are diminished L>r. Neuro: Alert and oriented X 3. No facial asymmetry. No focal deficit. Moves all extremities spontaneously. Psych:  Responds to questions appropriately with a normal affect.   Assessment and Plan:   1. PVD with prior fem-fem bpg with large pseudoaneurysm requiring upcoming surgical intervention 2. Chronic atrial fibrillation, on Coumadin 3. ?Sinus of valsalva aneurysm by last echo 4. H/o CVA 5. HTN 6. Diabetes mellitus 7. Remote hx of lymphoma - will need to f/u with primary doctor to make sure no further surveillance is needed for this as outpatient (normal CBC) 8. Carotid bruits - w/u per vascular   Discussed case with Dr. Patty Sermons. Preliminarily we will obtain 2D echo to re-evaluate LV function, wall motion, and also to assess possible sinus of valsalva aneurysm seen on prior echocardiogram. Continue tele perioperatively as well as BB & digoxin. See addendum by Dr. Patty Sermons below.  Signed, Dayna Dunn PA-C 12/05/2011, 10:14 AM Agree with assessment and plan as noted above.  The patient has not been experiencing any symptoms of angina pectoris or congestive heart failure.  She has had no recent TIAs or recurrent cerebrovascular accident symptoms.  She has had steadily progressive discomfort in her lower abdomen over the past several weeks.  Exam in the office yesterday showed the large firm but pulsatile lower abdominal mass with a palpable thrill and an audible bruit.  We will update her two-dimensional echocardiogram to evaluate left ventricular systolic function and wall motion.  Her electrocardiogram was reviewed and compared with previous EKGs and shows no significant change in the chronic inferolateral T-wave abnormalities  We will continue current outpatient medication except warfarin has been reversed.  Will also hold metformin since she did receive contrast last evening.  From a  cardiac standpoint we will await results of today's echo but at this point I see no reason not to proceed with surgery tomorrow as planned.

## 2011-12-05 NOTE — ED Notes (Addendum)
Pt notified me that she was having pain in her left upper thigh and leg. Pt rated pain as a 10/ pt continued to moan and holler verbally. Notified Charge Christa,RN and the  Cardiovascular Physician Dr. Waverly Ferrari, Per telephone order by Dr. Edilia Bo give patient 5mg  of morphine once by IV for pain, 10mg  labetalol once IV for pt Blood Pressure of 184/111. Read back and confirmed order

## 2011-12-05 NOTE — ED Notes (Signed)
Patient has been placed on bedpan 2 more times to void.

## 2011-12-05 NOTE — ED Provider Notes (Addendum)
Medical screening examination/treatment/procedure(s) were conducted as a shared visit with non-physician practitioner(s) and myself.  I personally evaluated the patient during the encounter. Patient seen by me complains of painful mass in lower abdomen for one month. On exam patient has a grapefruit-sized mass as suprapubic area, minimally tender, nonpulsatile  Doug Sou, MD 12/05/11 1610  Doug Sou, MD 12/05/11 [phone]

## 2011-12-05 NOTE — Progress Notes (Signed)
ANTICOAGULATION CONSULT NOTE - Initial Consult  Pharmacy Consult for Lovenox Indication: atrial fibrillation  Allergies  Allergen Reactions  . Penicillins     Patient Measurements: Height: 5\' 5"  (165.1 cm) Weight: 158 lb (71.668 kg) IBW/kg (Calculated) : 57   Vital Signs: Temp: 96.9 F (36.1 C) (11/06 1615) Temp src: Oral (11/06 1615) BP: 153/82 mmHg (11/06 1615) Pulse Rate: 68  (11/06 1415)  Labs:  Basename 12/05/11 1746 12/04/11 2039 12/04/11 1835  HGB -- 13.9 13.0  HCT -- 41.0 39.0  PLT -- -- 171  APTT -- -- 41*  LABPROT 17.1* -- 28.7*  INR 1.43 -- 2.88*  HEPARINUNFRC -- -- --  CREATININE -- 1.10 0.99  CKTOTAL -- -- --  CKMB -- -- --  TROPONINI -- -- --    Estimated Creatinine Clearance: 46.6 ml/min (by C-G formula based on Cr of 1.1).   Medical History: Past Medical History  Diagnosis Date  . Chronic atrial fibrillation   . Stroke   . Hypertension   . Hypercholesterolemia   . Diabetes mellitus   . Sinus of Valsalva aneurysm     a. By 2D echo 05/2011.  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  . CHF (congestive heart failure)   . GERD (gastroesophageal reflux disease)    Assessment: Casey [name] is a 71 y.o. female found to have a large pseudoaneurysm of fem-fem bypass graft that will need exploration later in week. On chronic coumadin PTA for afib. Coumadin currently on hold and plan to start full dose lovenox when INR ~2 (spoke with Dr. Edilia Bo regarding start time of lovenox). INR 2.88 at baseline now down to 1.43 after Vit K 2mg  IV given ~921 AM. FFP given ~0400, and ~noon.  Last coumadin dose taken 11/5.   Goal of Therapy:  Anti-Xa level 0.6-1.2 units/ml 4hrs after LMWH dose given Monitor platelets by anticoagulation protocol: Yes   Plan:  Begin lovenox 70mg  SQ q12h  CBC every 3 days while on lovenox   Thank you,  Brett Fairy, PharmD 12/05/2011 7:37 PM

## 2011-12-05 NOTE — ED Provider Notes (Signed)
I have personally seen and examined the patient.  I have discussed the plan of care with the resident.  I have reviewed the documentation on PMH/FH/Soc. History.  I have reviewed the documentation of the resident and agree.  Doug Sou, MD 12/05/11 [phone]

## 2011-12-05 NOTE — Progress Notes (Signed)
*  PRELIMINARY RESULTS* Vascular Ultrasound Left Lower Extremity Arterial Duplex has been completed.   The left femoral-popliteal bypass demonstrates patency from the groin to the distal calf, with monophasic waveforms throughout. There is evidence of a very large pseudoaneurysm of the left groin with what appears to be an outpouching of unknown etiology extending anteriorly into the palpable abdominal mass. Preliminary results discussed with Dr.Dickson.  12/05/2011 11:57 AM Gertie Fey, RDMS, RDCS

## 2011-12-05 NOTE — ED Notes (Signed)
MD Dickson at bedside.

## 2011-12-05 NOTE — Progress Notes (Signed)
VASCULAR LAB PRELIMINARY  ARTERIAL  ABI completed:      RIGHT    LEFT    PRESSURE WAVEFORM  PRESSURE WAVEFORM  BRACHIAL 149 Triphasic  BRACHIAL 154 Triphasic   DP Unable to obtain due to barely audible signal Severely dampened mono DP Unable to obtain due to uncontrollable movement Severely dampened mono  AT   AT    PT Not insonated  PT Unable to obtain due to uncontrollable movement Dampened monophasic   PER Not insonated  PER    GREAT TOE  NA GREAT TOE  NA    RIGHT LEFT  ABI       Devony Mcgrady, RVT 12/05/2011, 9:55 AM

## 2011-12-05 NOTE — Progress Notes (Signed)
BP 145/64 LEFT ARM HR 71,   BP 151/73 HR 78 RIGHT ARM

## 2011-12-05 NOTE — Progress Notes (Addendum)
ANTICOAGULATION CONSULT NOTE - Initial Consult  Pharmacy Consult for Lovenox (when INR </=2) Indication: atrial fibrillation  Allergies  Allergen Reactions  . Penicillins     Patient Measurements: Height: 5\' 5"  (165.1 cm) Weight: 158 lb (71.668 kg) IBW/kg (Calculated) : 57   Vital Signs: Temp: 97.9 F (36.6 C) (11/05 2120) Temp src: Oral (11/05 2120) BP: 178/82 mmHg (11/05 2120) Pulse Rate: 69  (11/05 2120)  Labs:  [facility] 12/04/11 2039 12/04/11 1835  HGB 13.9 13.0  HCT 41.0 39.0  PLT -- 171  APTT -- 41*  LABPROT -- 28.7*  INR -- 2.88*  HEPARINUNFRC -- --  CREATININE 1.10 0.99  CKTOTAL -- --  CKMB -- --  TROPONINI -- --    Estimated Creatinine Clearance: 46.6 ml/min (by C-G formula based on Cr of 1.1).   Medical History: Past Medical History  Diagnosis Date  . Chronic atrial fibrillation   . Stroke   . Hypertension   . Hypercholesterolemia   . Diabetes mellitus     Medications:  Allopurinol, amlodipine, ASA 81 mg, digoxin, donepezil, lasix, SSI, Lantus, metformin, metoprolol XL, oxybutynin, KCL, miralax, zocor, warfarin 5mg  daily except for 2.5mg  on Tues and Sat  Assessment: 71 y.o. female presents with pulsatile mass L lower abdomen - found to have large pseudoaneurysm of fem-fem bypass graft - will need exploration later in week. On chronic coumadin PTA for afib. Coumadin currently on hold and plan to start lovenox when INR ~2 (spoke with Dr. Edilia Bo regarding start time of lovenox). INR 2.88 at baseline. Last coumadin dose taken 11/5.  Goal of Therapy:  Anti-Xa level 0.6-1.2 units/ml 4hrs after LMWH dose given Monitor platelets by anticoagulation protocol: Yes   Plan:  1. Daily INR - will plan to start lovenox when INR close to 2 (per Dr Edilia Bo phone order clarification)  Shona Simpson Hilario Quarry 12/05/2011,12:58 AM  Addendum: Pt now being given Vit K 2mg  IV and FFP in case surgery needs to be done sooner. Will f/u INR in a.m.

## 2011-12-05 NOTE — Progress Notes (Signed)
VASCULAR PROGRESS NOTE  SUBJECTIVE: Called by ED nurse to see patient because of sudden increase in pain in both legs. Pt denies pain at the site of the pseudoaneurysm. She says that she has pain in both legs, but is unable to describe the pain for me. Earlier, when I first examined her, she was complaining of pain in her right arm. She is a very poor historian.   PHYSICAL EXAM: Filed Vitals:   12/04/11 2120 12/04/11 2300 12/05/11 0030 12/05/11 0230  BP: 178/82 145/80 188/90 179/91  Pulse: 69 136 71 68  Temp: 97.9 F (36.6 C)     TempSrc: Oral     Resp: 14 21 16 17   Height:  5\' 5"  (1.651 m)    Weight:  158 lb (71.668 kg)    SpO2: 99% 97% 98% 98%   No change in pulsatile mass in LLQ. This is not tender. Both feet are warm.  LABS: Lab Results  Component Value Date   WBC 6.6 12/04/2011   HGB 13.9 12/04/2011   HCT 41.0 12/04/2011   MCV 84.4 12/04/2011   PLT 171 12/04/2011   Lab Results  Component Value Date   CREATININE 1.10 12/04/2011   Lab Results  Component Value Date   INR 2.88* 12/04/2011   CBG (last 3)   Basename 12/05/11 0018  GLUCAP 141*     ASSESSMENT/PLAN: Etiology of bilat leg pain is not clear, but given that the feet are warm, I do not think that the pain is from ischemia. Likewise, there has not been any change in the LLQ aneurysm, which she says has been there for 1 month. Regardless, will transfuse FFP slowly in case we need to consider surgery sooner than Thursday. Will also control BP and give pain meds as needed.  Waverly Ferrari, MD, FACS Beeper: [phone] 12/05/2011

## 2011-12-06 ENCOUNTER — Encounter (HOSPITAL_COMMUNITY)
Admission: EM | Disposition: A | Discharge status: Skilled Nursing Facility | Payer: Self-pay | Source: Home / Self Care | Attending: Vascular Surgery

## 2011-12-06 ENCOUNTER — Inpatient Hospital Stay (HOSPITAL_COMMUNITY): Payer: Medicare Other | Admitting: Anesthesiology

## 2011-12-06 ENCOUNTER — Encounter (HOSPITAL_COMMUNITY): Payer: Self-pay | Admitting: Anesthesiology

## 2011-12-06 ENCOUNTER — Inpatient Hospital Stay (HOSPITAL_COMMUNITY): Payer: Medicare Other

## 2011-12-06 HISTORY — PX: FEMORAL ARTERY EXPLORATION: SHX5160

## 2011-12-06 HISTORY — PX: FEMORAL-FEMORAL BYPASS GRAFT: SHX936

## 2011-12-06 HISTORY — PX: FALSE ANEURYSM REPAIR: SHX5152

## 2011-12-06 LAB — BASIC METABOLIC PANEL
CO2: 25 mEq/L (ref 19–32)
Calcium: 9.8 mg/dL (ref 8.4–10.5)
Chloride: 104 mEq/L (ref 96–112)
Creatinine, Ser: 1 mg/dL (ref 0.50–1.10)
GFR calc Af Amer: 64 mL/min — ABNORMAL LOW (ref >90)
Sodium: 140 mEq/L (ref 135–145)

## 2011-12-06 LAB — PROTIME-INR
INR: 1.38 (ref 0.00–1.49)
Prothrombin Time: 16.6 seconds — ABNORMAL HIGH (ref 11.6–15.2)

## 2011-12-06 LAB — PREPARE FRESH FROZEN PLASMA: Unit division: 0

## 2011-12-06 LAB — CBC
MCH: 28.1 pg (ref 26.0–34.0)
MCV: 83.5 fL (ref 78.0–100.0)
Platelets: 194 10*3/uL (ref 150–400)
RDW: 15.3 % (ref 11.5–15.5)
WBC: 6.6 10*3/uL (ref 4.0–10.5)

## 2011-12-06 LAB — GLUCOSE, CAPILLARY: Glucose-Capillary: 133 mg/dL — ABNORMAL HIGH (ref 70–99)

## 2011-12-06 SURGERY — EXPLORATION, ARTERY, FEMORAL
Anesthesia: General | Site: Groin | Wound class: Clean

## 2011-12-06 MED ORDER — LIDOCAINE HCL (CARDIAC) 20 MG/ML IV SOLN
INTRAVENOUS | Status: DC | PRN
  Administered 2011-12-06: 50 mg via INTRAVENOUS

## 2011-12-06 MED ORDER — PROTAMINE SULFATE 10 MG/ML IV SOLN
INTRAVENOUS | Status: DC | PRN
  Administered 2011-12-06 (×4): 10 mg via INTRAVENOUS

## 2011-12-06 MED ORDER — HYDROMORPHONE HCL PF 1 MG/ML IJ SOLN: 0.2500 mg | INTRAMUSCULAR | Status: DC | PRN

## 2011-12-06 MED ORDER — METOPROLOL SUCCINATE ER 25 MG PO TB24
25.0000 mg | ORAL_TABLET | Freq: Two times a day (BID) | ORAL | Status: DC
Start: 2011-12-06 — End: 2011-12-06
  Filled 2011-12-06: qty 1

## 2011-12-06 MED ORDER — ROCURONIUM BROMIDE 100 MG/10ML IV SOLN
INTRAVENOUS | Status: DC | PRN
  Administered 2011-12-06: 50 mg via INTRAVENOUS

## 2011-12-06 MED ORDER — ALBUMIN HUMAN 5 % IV SOLN
INTRAVENOUS | Status: DC | PRN
  Administered 2011-12-06: 14:00:00 via INTRAVENOUS

## 2011-12-06 MED ORDER — VECURONIUM BROMIDE 10 MG IV SOLR
INTRAVENOUS | Status: DC | PRN
  Administered 2011-12-06: 2 mg via INTRAVENOUS

## 2011-12-06 MED ORDER — HEPARIN SODIUM (PORCINE) 1000 UNIT/ML IJ SOLN
INTRAMUSCULAR | Status: DC | PRN
  Administered 2011-12-06: 7000 [IU] via INTRAVENOUS

## 2011-12-06 MED ORDER — ZOLPIDEM TARTRATE 5 MG PO TABS: 5.0000 mg | ORAL_TABLET | Freq: Every evening | ORAL | Status: DC | PRN

## 2011-12-06 MED ORDER — HYDROMORPHONE HCL PF 1 MG/ML IJ SOLN
INTRAMUSCULAR | Status: AC
  Administered 2011-12-06: 0.25 mg via INTRAVENOUS
  Filled 2011-12-06: qty 1

## 2011-12-06 MED ORDER — FENTANYL CITRATE 0.05 MG/ML IJ SOLN
INTRAMUSCULAR | Status: DC | PRN
  Administered 2011-12-06: 100 ug via INTRAVENOUS
  Administered 2011-12-06: 150 ug via INTRAVENOUS

## 2011-12-06 MED ORDER — 0.9 % SODIUM CHLORIDE (POUR BTL) OPTIME
TOPICAL | Status: DC | PRN
  Administered 2011-12-06: 2000 mL

## 2011-12-06 MED ORDER — HYDROMORPHONE HCL PF 1 MG/ML IJ SOLN
0.2500 mg | INTRAMUSCULAR | Status: DC | PRN
  Administered 2011-12-06 (×2): 0.25 mg via INTRAVENOUS

## 2011-12-06 MED ORDER — LACTATED RINGERS IV SOLN
INTRAVENOUS | Status: DC | PRN
  Administered 2011-12-06 (×2): via INTRAVENOUS

## 2011-12-06 MED ORDER — SODIUM CHLORIDE 0.9 % IV SOLN
INTRAVENOUS | Status: DC
  Administered 2011-12-06: 75 mL/h via INTRAVENOUS

## 2011-12-06 MED ORDER — SODIUM CHLORIDE 0.9 % IR SOLN
Status: DC | PRN
  Administered 2011-12-06: 10:00:00

## 2011-12-06 MED ORDER — TEMAZEPAM 15 MG PO CAPS: 15.0000 mg | ORAL_CAPSULE | Freq: Every evening | ORAL | Status: DC | PRN

## 2011-12-06 MED ORDER — THROMBIN 20000 UNITS EX SOLR
CUTANEOUS | Status: AC
  Filled 2011-12-06: qty 20000

## 2011-12-06 MED ORDER — MAGNESIUM SULFATE 40 MG/ML IJ SOLN
2.0000 g | Freq: Once | INTRAMUSCULAR | Status: AC | PRN
  Filled 2011-12-06: qty 50

## 2011-12-06 MED ORDER — DOCUSATE SODIUM 100 MG PO CAPS
100.0000 mg | ORAL_CAPSULE | Freq: Every day | ORAL | Status: DC
  Administered 2011-12-07 – 2011-12-11 (×5): 100 mg via ORAL
  Filled 2011-12-06 (×5): qty 1

## 2011-12-06 MED ORDER — SODIUM CHLORIDE 0.9 % IV SOLN: 500.0000 mL | Freq: Once | INTRAVENOUS | Status: AC | PRN

## 2011-12-06 MED ORDER — SODIUM CHLORIDE 0.9 % IV SOLN
INTRAVENOUS | Status: DC | PRN
  Administered 2011-12-06: 15:00:00 via INTRAVENOUS

## 2011-12-06 MED ORDER — VANCOMYCIN HCL IN DEXTROSE 1-5 GM/200ML-% IV SOLN
1000.0000 mg | Freq: Two times a day (BID) | INTRAVENOUS | Status: AC
  Administered 2011-12-06 – 2011-12-07 (×2): 1000 mg via INTRAVENOUS
  Filled 2011-12-06 (×2): qty 200

## 2011-12-06 MED ORDER — CEFAZOLIN SODIUM 1-5 GM-% IV SOLN
INTRAVENOUS | Status: AC
  Filled 2011-12-06: qty 50

## 2011-12-06 MED ORDER — POTASSIUM CHLORIDE CRYS ER 20 MEQ PO TBCR: 20.0000 meq | EXTENDED_RELEASE_TABLET | Freq: Once | ORAL | Status: AC | PRN

## 2011-12-06 MED ORDER — MORPHINE SULFATE 2 MG/ML IJ SOLN
2.0000 mg | INTRAMUSCULAR | Status: DC | PRN
  Administered 2011-12-07 – 2011-12-08 (×4): 2 mg via INTRAVENOUS
  Filled 2011-12-06 (×5): qty 1

## 2011-12-06 MED ORDER — ONDANSETRON HCL 4 MG/2ML IJ SOLN: 4.0000 mg | Freq: Four times a day (QID) | INTRAMUSCULAR | Status: DC | PRN

## 2011-12-06 MED ORDER — LACTATED RINGERS IV SOLN
INTRAVENOUS | Status: DC
  Administered 2011-12-06: 11:00:00 via INTRAVENOUS

## 2011-12-06 MED ORDER — DOPAMINE-DEXTROSE 3.2-5 MG/ML-% IV SOLN: 3.0000 ug/kg/min | INTRAVENOUS | Status: DC

## 2011-12-06 MED ORDER — PROPOFOL 10 MG/ML IV BOLUS
INTRAVENOUS | Status: DC | PRN
  Administered 2011-12-06: 150 mg via INTRAVENOUS

## 2011-12-06 SURGICAL SUPPLY — 74 items
BANDAGE ELASTIC 4 VELCRO ST LF (GAUZE/BANDAGES/DRESSINGS) IMPLANT
BANDAGE ESMARK 6X9 LF (GAUZE/BANDAGES/DRESSINGS) IMPLANT
BNDG ESMARK 6X9 LF (GAUZE/BANDAGES/DRESSINGS)
CANISTER SUCTION 2500CC (MISCELLANEOUS) ×5 IMPLANT
CANNULA VESSEL W/WING W/VALVE (CANNULA) ×5 IMPLANT
CATH EMB 4FR 40CM (CATHETERS) ×5 IMPLANT
CATH EMB 4FR 80CM (CATHETERS) ×5 IMPLANT
CATH EMB 6FR 80CM (CATHETERS) ×5 IMPLANT
CLIP TI MEDIUM 24 (CLIP) ×5 IMPLANT
CLIP TI WIDE RED SMALL 24 (CLIP) ×5 IMPLANT
CLOTH BEACON ORANGE TIMEOUT ST (SAFETY) ×5 IMPLANT
COVER SURGICAL LIGHT HANDLE (MISCELLANEOUS) ×5 IMPLANT
CUFF TOURNIQUET SINGLE 24IN (TOURNIQUET CUFF) IMPLANT
CUFF TOURNIQUET SINGLE 34IN LL (TOURNIQUET CUFF) IMPLANT
CUFF TOURNIQUET SINGLE 44IN (TOURNIQUET CUFF) IMPLANT
DERMABOND ADVANCED (GAUZE/BANDAGES/DRESSINGS) ×2
DERMABOND ADVANCED .7 DNX12 (GAUZE/BANDAGES/DRESSINGS) ×8 IMPLANT
DRAIN CHANNEL 15F RND FF W/TCR (WOUND CARE) IMPLANT
DRAIN PENROSE 3/4X12 (DRAIN) IMPLANT
DRAIN SNY 10X20 3/4 PERF (WOUND CARE) IMPLANT
DRAPE INCISE IOBAN 66X45 STRL (DRAPES) ×5 IMPLANT
DRAPE PROXIMA HALF (DRAPES) ×5 IMPLANT
DRAPE WARM FLUID 44X44 (DRAPE) ×5 IMPLANT
DRAPE X-RAY CASS 24X20 (DRAPES) IMPLANT
DRSG COVADERM 4X10 (GAUZE/BANDAGES/DRESSINGS) IMPLANT
DRSG COVADERM 4X8 (GAUZE/BANDAGES/DRESSINGS) IMPLANT
ELECT REM PT RETURN 9FT ADLT (ELECTROSURGICAL) ×5
ELECTRODE REM PT RTRN 9FT ADLT (ELECTROSURGICAL) ×4 IMPLANT
EVACUATOR SILICONE 100CC (DRAIN) IMPLANT
GAUZE SPONGE 4X4 16PLY XRAY LF (GAUZE/BANDAGES/DRESSINGS) ×5 IMPLANT
GLOVE BIO SURGEON STRL SZ7.5 (GLOVE) ×5 IMPLANT
GLOVE BIOGEL PI IND STRL 6.5 (GLOVE) ×4 IMPLANT
GLOVE BIOGEL PI IND STRL 8 (GLOVE) ×4 IMPLANT
GLOVE BIOGEL PI INDICATOR 6.5 (GLOVE) ×1
GLOVE BIOGEL PI INDICATOR 8 (GLOVE) ×1
GLOVE ORTHO TXT STRL SZ7.5 (GLOVE) ×5 IMPLANT
GLOVE SURG SS PI 6.5 STRL IVOR (GLOVE) ×10 IMPLANT
GLOVE SURG SS PI 7.0 STRL IVOR (GLOVE) ×15 IMPLANT
GOWN PREVENTION PLUS XLARGE (GOWN DISPOSABLE) ×10 IMPLANT
GOWN STRL NON-REIN LRG LVL3 (GOWN DISPOSABLE) ×10 IMPLANT
INSERT FOGARTY SM (MISCELLANEOUS) IMPLANT
KIT BASIN OR (CUSTOM PROCEDURE TRAY) ×5 IMPLANT
KIT ROOM TURNOVER OR (KITS) ×5 IMPLANT
MARKER GRAFT CORONARY BYPASS (MISCELLANEOUS) IMPLANT
NS IRRIG 1000ML POUR BTL (IV SOLUTION) ×10 IMPLANT
PACK PERIPHERAL VASCULAR (CUSTOM PROCEDURE TRAY) ×5 IMPLANT
PAD ARMBOARD 7.5X6 YLW CONV (MISCELLANEOUS) ×10 IMPLANT
PADDING CAST COTTON 6X4 STRL (CAST SUPPLIES) IMPLANT
SET COLLECT BLD 21X3/4 12 (NEEDLE) IMPLANT
SPONGE SURGIFOAM ABS GEL 100 (HEMOSTASIS) IMPLANT
STAPLER VISISTAT 35W (STAPLE) IMPLANT
STOPCOCK 4 WAY LG BORE MALE ST (IV SETS) ×15 IMPLANT
SUT ETHILON 3 0 PS 1 (SUTURE) IMPLANT
SUT PROLENE 5 0 C 1 24 (SUTURE) ×5 IMPLANT
SUT PROLENE 5 0 C 1 36 (SUTURE) ×5 IMPLANT
SUT PROLENE 6 0 BV (SUTURE) ×5 IMPLANT
SUT PROLENE 7 0 BV 1 (SUTURE) IMPLANT
SUT SILK 2 0 FS (SUTURE) IMPLANT
SUT SILK 3 0 (SUTURE) ×1
SUT SILK 3-0 18XBRD TIE 12 (SUTURE) ×4 IMPLANT
SUT VIC AB 2-0 CT1 27 (SUTURE) ×3
SUT VIC AB 2-0 CT1 TAPERPNT 27 (SUTURE) ×12 IMPLANT
SUT VIC AB 2-0 CTB1 (SUTURE) ×10 IMPLANT
SUT VIC AB 3-0 SH 27 (SUTURE) ×3
SUT VIC AB 3-0 SH 27X BRD (SUTURE) ×12 IMPLANT
SUT VICRYL 4-0 PS2 18IN ABS (SUTURE) ×15 IMPLANT
SYR 3ML LL SCALE MARK (SYRINGE) ×15 IMPLANT
TOWEL OR 17X24 6PK STRL BLUE (TOWEL DISPOSABLE) ×10 IMPLANT
TOWEL OR 17X26 10 PK STRL BLUE (TOWEL DISPOSABLE) ×10 IMPLANT
TRAY FOLEY CATH 14FRSI W/METER (CATHETERS) ×5 IMPLANT
TUBING EXTENTION W/L.L. (IV SETS) IMPLANT
UNDERPAD 30X30 INCONTINENT (UNDERPADS AND DIAPERS) ×5 IMPLANT
WATER STERILE IRR 1000ML POUR (IV SOLUTION) ×5 IMPLANT
YANKAUER SUCT BULB TIP NO VENT (SUCTIONS) ×5 IMPLANT

## 2011-12-06 NOTE — Interval H&P Note (Signed)
History and Physical Interval Note:  12/06/2011 11:04 AM  Casey [name]  has presented today for surgery, with the diagnosis of Pseudoaneurysm of left side femoral-femoral bypass   The various methods of treatment have been discussed with the patient and family. After consideration of risks, benefits and other options for treatment, the patient has consented to  Procedure(s) (LRB) with comments: FEMORAL ARTERY EXPLORATION (Left) - Exploration of large pseudoaneurysm left side of fem-fem bypass graft  BYPASS GRAFT AXILLA-BIFEMORAL (Left) - Possible Revision of Fem-Fem Bypass; possible removal of Fem-Fem Bypass; Possible Left Ax- Fem Bypass Graft as a surgical intervention .  The patient's history has been reviewed, patient examined, no change in status, stable for surgery.  I have reviewed the patient's chart and labs.  Questions were answered to the patient's satisfaction.     DICKSON,CHRISTOPHER S

## 2011-12-06 NOTE — Op Note (Signed)
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 Exploration of left femoral pseudoaneurysm  2. Repair of pseudoaneurysm of fem-fem bypass graft  3. Revision of fem-fem bypass graft  SURGEON: Di Kindle. Edilia Bo, MD, FACS  ASSIST: Doreatha Massed, PA  ANESTHESIA: Gen.   EBL: minimal  INDICATIONS: Casey [name] is a 71 y.o. female who had a previous femoropopliteal bypass graft with vein and then most recently a right to left fem-fem bypass graft by Dr. Hart Rochester 2010. She presented with a large pseudoaneurysm on the left side of her right to left fem-fem bypass graft. Once her Coumadin was corrected she was brought for repair of her pseudoaneurysm  FINDINGS: a previous Dacron patch on the common femoral artery had separated as had the anastomosis of the fem-fem graft on the left. Femoropopliteal bypass graft was patent. I was able to reimplant the left limb of the fem-fem bypass graft encompassing both the external iliac artery which had some backbleeding and also the origin of the left femoropopliteal bypass graft. The superficial femoral artery and deep femoral arteries on the left were occluded.  TECHNIQUE: The patient was brought to the operating room and received a general anesthetic. Both groins, the lower abdomen, and the entire left lower extremity were prepped and draped in the usual sterile fashion. First an incision was made in the right groin for proximal control. The right limb of the fem-fem bypass graft was dissected free circumferentially so that it could be clamped. Next a long incision was made in the left groin and distally I was able to control the native superficial femoral artery and deep femoral artery. Proximal bypass graft was identified. The patient was then heparinized. The right limb of the fem-fem bypass graft was  clamped and then the superficial femoral artery and deep femoral arteries were controlled with vessel loops. The large pseudoaneurysm was entered and a large amount of clot and blood retrieved. There was back bleeding from the femoropopliteal bypass graft and I placed a 4 Fogarty catheter inflated the balloon for hemostasis. Was also bleeding from the common femoral artery proximally and again a separate Fogarty catheter was used for control. At this point it became clear that the Dacron patch which had been previously placed on the common femoral artery in which likely was the origin of the femoropopliteal bypass graft with vein had separated from the artery. Likewise the anastomosis of the fem-fem graft and separate it completely. It was enough graft remaining that I could reimplant the graft to encompass both the origin of the left femoropopliteal bypass graft and also the common femoral artery proximally. Thrombectomy was performed of the graft there was no clot within the graft. The anastomosis was sewn with 5-0 Prolene suture. Prior to completing anastomosis the graft was flushed the arteries backbled and the anastomosis completed. Flow was established the left leg. Hemostasis was obtained and the wounds. The right groin incision was closed with deep layer of 2-0 Vicryl, subcutaneous layer 2-0 Vicryl and the skin closed with 4-0 subcuticular stitch. Of note the heparin was partially reversed with protamine. The left groin incision was closed the deep layer 2-0 Vicryl, subcutaneous layer of 3-0 Vicryl, and the skin closed with a 4-0 subcuticular stitch. Dermabond was applied. Patient tolerated the procedure well and was transferred to the recovery room in stable condition. All  needle and sponge counts were correct  Waverly Ferrari, MD, FACS [facility]  DATE OF DICTATION:   12/06/2011

## 2011-12-06 NOTE — Progress Notes (Signed)
Subjective:  No new complaints overnight.  No chest pain or dyspnea.  O2 sats normal. Telemetry shows atrial fibrillation with slow response while asleep, normal response otherwise.  Objective:  Vital Signs in the last 24 hours: Temp:  [96.8 F (36 C)-99 F (37.2 C)] 98.6 F (37 C) (11/07 0434) Pulse Rate:  [68-97] 83  (11/07 0434) Resp:  [16-20] 18  (11/07 0434) BP: (138-168)/(60-89) 152/60 mmHg (11/07 0434) SpO2:  [97 %-100 %] 97 % (11/07 0434) Weight:  [152 lb 6.4 oz (69.128 kg)] 152 lb 6.4 oz (69.128 kg) (11/07 0434)  Intake/Output from previous day: 11/06 0701 - 11/07 0700 In: 1305 [P.O.:480; I.V.:500; Blood:325] Out: -  Intake/Output from this shift:       . allopurinol  100 mg Oral Daily  . amLODipine  5 mg Oral Daily  . aspirin  81 mg Oral Daily  . [COMPLETED]  ceFAZolin (ANCEF) IV  1 g Intravenous On Call  . digoxin  125 mcg Oral Daily  . donepezil  10 mg Oral Daily  . enoxaparin (LOVENOX) injection  70 mg Subcutaneous Q12H  . furosemide  20 mg Oral Daily  . insulin aspart  0-15 Units Subcutaneous TID WC  . insulin aspart  4 Units Subcutaneous TID WC  . insulin glargine  12 Units Subcutaneous QHS  . metoprolol succinate  50 mg Oral BID  . metoprolol succinate  50 mg Oral Daily  . oxybutynin  10 mg Oral QHS  . oxybutynin  5 mg Oral Daily  . pantoprazole  40 mg Oral Daily  . [COMPLETED] phytonadione (VITAMIN K) IV  2 mg Intravenous Once  . polyethylene glycol  17 g Oral Daily  . potassium chloride  10 mEq Oral Daily  . potassium chloride  20-40 mEq Oral Once  . simvastatin  20 mg Oral QHS  . [DISCONTINUED] amLODipine  5 mg Oral Daily  . [DISCONTINUED] metFORMIN  500 mg Oral BID WC      Physical Exam: The patient appears to be in no distress.  Head: Normocephalic, atraumatic, sclera non-icteric, no xanthomas, nares are without discharge.  Neck: Bilateral carotid bruits R>L. JVD not elevated.  Lungs: Clear bilaterally to auscultation without wheezes,  rales, or rhonchi. Breathing is unlabored.  Heart: Irregularly irregular but rate controlled, with S1 S2. No murmurs, rubs, or gallops appreciated.  Abdomen: General abdomen is soft, nontender and nondistented with normoactive bowel sounds. However, she has a very firm palpable LLQ mass about 2/3 the size of a bowling ball that is pulsatile in nature with a palpable thrill.  Msk: Strength and tone appear normal for age.  Extremities: No clubbing or cyanosis. Tr LLE edema. Distal pedal pulses are diminished L>r.  Neuro: Alert and oriented X 3. No facial asymmetry. No focal deficit. Moves all extremities spontaneously.  Psych: Responds to questions appropriately with a normal affect.    Lab Results:  Basename 12/06/11 0440 12/05/11 2004  WBC PENDING 7.2  HGB 12.3 12.1  PLT 194 155    Basename 12/06/11 0440 12/05/11 2004  NA 140 137  K 3.8 3.9  CL 104 100  CO2 25 25  GLUCOSE 102* 167*  BUN 15 18  CREATININE 1.00 0.99   No results found for this basename: TROPONINI:2,CK,MB:2 in the last 72 hours Hepatic Function Panel  Basename 12/05/11 2004  PROT 7.5  ALBUMIN 3.7  AST 20  ALT 13  ALKPHOS 95  BILITOT 0.6  BILIDIR --  IBILI --   No results  found for this basename: CHOL in the last 72 hours No results found for this basename: PROTIME in the last 72 hours  Imaging: Imaging results have been reviewed  Cardiac Studies: Telemetry shows atrial fibrillation with variable ventricular response. 2D echo shows EF 45% Assessment/Plan:   1. PVD with prior fem-fem bpg with large pseudoaneurysm requiring upcoming surgical intervention  2. 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 H/o CVA  5. HTN  6. Diabetes mellitus  7. Remote hx of lymphoma - will need to f/u with primary doctor to make sure no further surveillance is needed for this as outpatient (normal CBC)  8. Carotid bruits - w/u per vascular   Plan: Okay for surgery today.    LOS: 2 days    Cassell Clement 12/06/2011, 6:46 AM

## 2011-12-06 NOTE — H&P (View-Only) (Signed)
VASCULAR PROGRESS NOTE  SUBJECTIVE: Patient does not complaining of leg pain this morning.  PHYSICAL EXAM: Filed Vitals:   12/05/11 0435 12/05/11 0505 12/05/11 0605 12/05/11 0700  BP: 152/69 159/75 128/66 158/71  Pulse: 80 68 88 79  Temp: 97.7 F (36.5 C) 97.9 F (36.6 C) 97.9 F (36.6 C) 98.2 F (36.8 C)  TempSrc: Oral Oral Oral Oral  Resp: 24 22 16 16  Height: 5' 5" (1.651 m)     Weight: 158 lb (71.668 kg)     SpO2: 100%  100%    No change in the left lower quadrant pseudoaneurysm. Both feet appear adequately perfused.  LABS: Lab Results  Component Value Date   WBC 6.6 12/04/2011   HGB 13.9 12/04/2011   HCT 41.0 12/04/2011   MCV 84.4 12/04/2011   PLT 171 12/04/2011   Lab Results  Component Value Date   CREATININE 1.10 12/04/2011   Lab Results  Component Value Date   INR 2.88* 12/04/2011   Patient is receiving FFP and follow up INR is pending. 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 Is not clear from exam or CT scan whether or not the left femoropopliteal bypass graft is patent.The best I can tell, she has likely had the pseudoaneurysm for approximately one month. However, given its size I think it needs to be addressed and I have recommended elective repair tomorrow. INR last night was 2.88 and she is receiving fresh frozen plasma and also received 2 mg of vitamin K. Follow up INR is pending. In anticipation of surgery tomorrow I have ordered a duplex of her left femoropopliteal bypass graft and ABIs. Her cardiologist is Dr. Tom Brackbill and we will also have cardiology see the patient for preoperative evaluation.  2. Although this does not appear to be infected by exam and she is not febrile, certainly is possible that on exploration we will find evidence of infection. In that case the graft would have to be removed with vein patch  angioplasty of the femoral arteries. I've explained that if the graft is removed this could result in a limb threatening situation.  3. If the graft is not infected I will have to try to determine how to maintain perfusion to the left leg after the pseudoaneurysm is addressed and this could potentially be a significant problem. This is a very large pseudoaneurysm  And she will be at high risk for wound complications related to this which could result in graft infection. I think I will have to determine how best to approach this if it is not infected intraoperatively. This could potentially require revision of her fem-fem graft or potentially even left axillofemoral bypass grafting. I've explained the risks including but not limited to bleeding, wound healing problems, limb loss or other unpredictable medical problems. She states that her family is in [place] and they are not available.  Christopher Dickson, MD, FACS Beeper: [phone] 12/05/2011    

## 2011-12-06 NOTE — Anesthesia Postprocedure Evaluation (Signed)
  Anesthesia Post-op Note  Patient: Casey [name]  Procedure(s) Performed: Procedure(s) (LRB) with comments: FEMORAL ARTERY EXPLORATION (N/A) - Exploration of large pseudoaneurysm left side of fem-fem bypass graft  BYPASS GRAFT FEMORAL-FEMORAL ARTERY (Bilateral) - Revision of left to right Femoral-Femoral bypass graft REPAIR FALSE ANEURYSM (Left) - Repair of left femoral Artery pseudoaneurysm  Patient Location: PACU  Anesthesia Type:General  Level of Consciousness: awake  Airway and Oxygen Therapy: Patient Spontanous Breathing  Post-op Pain: mild  Post-op Assessment: Post-op Vital signs reviewed  Post-op Vital Signs: Reviewed  Complications: No apparent anesthesia complications

## 2011-12-06 NOTE — Progress Notes (Signed)
Transported via bed to surgery per surgical staff. Casey [name]

## 2011-12-06 NOTE — Anesthesia Preprocedure Evaluation (Addendum)
Anesthesia Evaluation  Patient identified by MRN, date of birth, ID band Patient awake    Reviewed: Allergy & Precautions, H&P , NPO status , Patient's Chart, lab work & pertinent test results  Airway Mallampati: I TM Distance: >3 FB Neck ROM: full    Dental   Pulmonary          Cardiovascular hypertension, Pt. on medications +CHF + dysrhythmias Atrial Fibrillation Rhythm:irregular Rate:Normal     Neuro/Psych CVA    GI/Hepatic GERD-  ,  Endo/Other  diabetes, Type 2  Renal/GU CRFRenal disease     Musculoskeletal   Abdominal   Peds  Hematology   Anesthesia Other Findings   Reproductive/Obstetrics                          Anesthesia Physical Anesthesia Plan  ASA: III  Anesthesia Plan: General   Post-op Pain Management:    Induction: Intravenous  Airway Management Planned: Oral ETT  Additional Equipment:   Intra-op Plan:   Post-operative Plan: Possible Post-op intubation/ventilation  Informed Consent: I have reviewed the patients History and Physical, chart, labs and discussed the procedure including the risks, benefits and alternatives for the proposed anesthesia with the patient or authorized representative who has indicated his/her understanding and acceptance.     Plan Discussed with: CRNA, Anesthesiologist and Surgeon  Anesthesia Plan Comments:        Anesthesia Quick Evaluation

## 2011-12-06 NOTE — Transfer of Care (Signed)
Immediate Anesthesia Transfer of Care Note  Patient: Aliyah A Kollman  Procedure(s) Performed: Procedure(s) (LRB) with comments: FEMORAL ARTERY EXPLORATION (N/A) - Exploration of large pseudoaneurysm left side of fem-fem bypass graft  BYPASS GRAFT FEMORAL-FEMORAL ARTERY (Bilateral) - Revision of left to right Femoral-Femoral bypass graft REPAIR FALSE ANEURYSM (Left) - Repair of left femoral Artery pseudoaneurysm  Patient Location: PACU  Anesthesia Type:General  Level of Consciousness: awake and alert   Airway & Oxygen Therapy: Patient Spontanous Breathing and Patient connected to nasal cannula oxygen  Post-op Assessment: Report given to PACU RN and Post -op Vital signs reviewed and stable  Post vital signs: Reviewed and stable  Complications: No apparent anesthesia complications

## 2011-12-06 NOTE — Preoperative (Signed)
Beta Blockers   Reason not to administer Beta Blockers:Not Applicable 

## 2011-12-06 NOTE — Progress Notes (Signed)
Patient's HR in 30's to 40's Atrial Fib. BP 140/58. Patient is asymptomatic. Spoke to Dr. Patty Sermons regarding HR; orders given to hold digoxin, metoprolol parameters/changes, and to hold lovenox. Casey [name]

## 2011-12-07 ENCOUNTER — Encounter (HOSPITAL_COMMUNITY): Payer: Self-pay | Admitting: Vascular Surgery

## 2011-12-07 ENCOUNTER — Telehealth: Payer: Self-pay | Admitting: Vascular Surgery

## 2011-12-07 DIAGNOSIS — Z48812 Encounter for surgical aftercare following surgery on the circulatory system: Secondary | ICD-10-CM

## 2011-12-07 LAB — PROTIME-INR: INR: 1.23 (ref 0.00–1.49)

## 2011-12-07 LAB — GLUCOSE, CAPILLARY
Glucose-Capillary: 105 mg/dL — ABNORMAL HIGH (ref 70–99)
Glucose-Capillary: 138 mg/dL — ABNORMAL HIGH (ref 70–99)
Glucose-Capillary: 142 mg/dL — ABNORMAL HIGH (ref 70–99)
Glucose-Capillary: 144 mg/dL — ABNORMAL HIGH (ref 70–99)

## 2011-12-07 LAB — TYPE AND SCREEN

## 2011-12-07 LAB — BASIC METABOLIC PANEL
BUN: 10 mg/dL (ref 6–23)
CO2: 25 mEq/L (ref 19–32)
Calcium: 8.9 mg/dL (ref 8.4–10.5)
Chloride: 106 mEq/L (ref 96–112)
Creatinine, Ser: 0.94 mg/dL (ref 0.50–1.10)

## 2011-12-07 LAB — CBC
HCT: 34.3 % — ABNORMAL LOW (ref 36.0–46.0)
MCH: 27.5 pg (ref 26.0–34.0)
MCV: 84.1 fL (ref 78.0–100.0)
RBC: 4.08 MIL/uL (ref 3.87–5.11)
WBC: 7.5 10*3/uL (ref 4.0–10.5)

## 2011-12-07 MED ORDER — ALLOPURINOL 100 MG PO TABS
100.0000 mg | ORAL_TABLET | Freq: Every day | ORAL | Status: DC
  Administered 2011-12-07 – 2011-12-11 (×5): 100 mg via ORAL
  Filled 2011-12-07 (×5): qty 1

## 2011-12-07 MED ORDER — POTASSIUM CHLORIDE CRYS ER 10 MEQ PO TBCR
10.0000 meq | EXTENDED_RELEASE_TABLET | Freq: Every day | ORAL | Status: DC
  Administered 2011-12-07 – 2011-12-11 (×5): 10 meq via ORAL
  Filled 2011-12-07 (×5): qty 1

## 2011-12-07 MED ORDER — OXYBUTYNIN CHLORIDE 5 MG PO TABS
5.0000 mg | ORAL_TABLET | Freq: Two times a day (BID) | ORAL | Status: DC
  Filled 2011-12-07 (×3): qty 2

## 2011-12-07 MED ORDER — INSULIN GLARGINE 100 UNIT/ML ~~LOC~~ SOLN
12.0000 [IU] | Freq: Every day | SUBCUTANEOUS | Status: DC
  Administered 2011-12-07 – 2011-12-10 (×3): 12 [IU] via SUBCUTANEOUS

## 2011-12-07 MED ORDER — METFORMIN HCL 500 MG PO TABS
500.0000 mg | ORAL_TABLET | Freq: Two times a day (BID) | ORAL | Status: DC
  Administered 2011-12-07 – 2011-12-11 (×9): 500 mg via ORAL
  Filled 2011-12-07 (×12): qty 1

## 2011-12-07 MED ORDER — INSULIN ASPART 100 UNIT/ML ~~LOC~~ SOLN
4.0000 [IU] | Freq: Three times a day (TID) | SUBCUTANEOUS | Status: DC
  Administered 2011-12-07 – 2011-12-11 (×10): 4 [IU] via SUBCUTANEOUS

## 2011-12-07 MED ORDER — ENOXAPARIN SODIUM 80 MG/0.8ML ~~LOC~~ SOLN
70.0000 mg | Freq: Two times a day (BID) | SUBCUTANEOUS | Status: DC
  Administered 2011-12-07 – 2011-12-08 (×4): 70 mg via SUBCUTANEOUS
  Filled 2011-12-07 (×8): qty 0.8

## 2011-12-07 MED ORDER — INFLUENZA VIRUS VACC SPLIT PF IM SUSP
0.5000 mL | INTRAMUSCULAR | Status: AC
  Administered 2011-12-09: 0.5 mL via INTRAMUSCULAR
  Filled 2011-12-07: qty 0.5

## 2011-12-07 MED ORDER — OXYBUTYNIN CHLORIDE 5 MG PO TABS: 5.0000 mg | ORAL_TABLET | Freq: Two times a day (BID) | ORAL | Status: DC

## 2011-12-07 MED ORDER — DONEPEZIL HCL 10 MG PO TABS
10.0000 mg | ORAL_TABLET | Freq: Every day | ORAL | Status: DC
  Administered 2011-12-07 – 2011-12-11 (×5): 10 mg via ORAL
  Filled 2011-12-07 (×5): qty 1

## 2011-12-07 MED ORDER — ASPIRIN 81 MG PO CHEW
81.0000 mg | CHEWABLE_TABLET | Freq: Every day | ORAL | Status: DC
  Administered 2011-12-07 – 2011-12-11 (×5): 81 mg via ORAL
  Filled 2011-12-07 (×5): qty 1

## 2011-12-07 MED ORDER — SIMVASTATIN 20 MG PO TABS
20.0000 mg | ORAL_TABLET | Freq: Every day | ORAL | Status: DC
  Administered 2011-12-07 – 2011-12-10 (×4): 20 mg via ORAL
  Filled 2011-12-07 (×5): qty 1

## 2011-12-07 MED ORDER — POLYETHYLENE GLYCOL 3350 17 GM/SCOOP PO POWD
17.0000 g | Freq: Every day | ORAL | Status: DC
  Administered 2011-12-07 – 2011-12-09 (×3): 17 g via ORAL
  Filled 2011-12-07 (×7): qty 255

## 2011-12-07 MED ORDER — METOPROLOL SUCCINATE ER 50 MG PO TB24
50.0000 mg | ORAL_TABLET | Freq: Two times a day (BID) | ORAL | Status: DC
  Administered 2011-12-07 – 2011-12-11 (×9): 50 mg via ORAL
  Filled 2011-12-07 (×10): qty 1

## 2011-12-07 MED ORDER — OXYCODONE HCL 5 MG PO TABS: 5.0000 mg | ORAL_TABLET | Freq: Four times a day (QID) | ORAL | Status: DC | PRN

## 2011-12-07 MED ORDER — POLYETHYLENE GLYCOL 3350 17 GM/SCOOP PO POWD
17.0000 g | Freq: Every day | ORAL | Status: DC
  Filled 2011-12-07: qty 255

## 2011-12-07 MED ORDER — FUROSEMIDE 20 MG PO TABS
20.0000 mg | ORAL_TABLET | Freq: Every day | ORAL | Status: DC
  Administered 2011-12-07 – 2011-12-11 (×5): 20 mg via ORAL
  Filled 2011-12-07 (×5): qty 1

## 2011-12-07 MED ORDER — AMLODIPINE BESYLATE 5 MG PO TABS
5.0000 mg | ORAL_TABLET | Freq: Every day | ORAL | Status: DC
  Filled 2011-12-07: qty 1

## 2011-12-07 MED ORDER — TRAMADOL HCL 50 MG PO TABS
50.0000 mg | ORAL_TABLET | Freq: Three times a day (TID) | ORAL | Status: DC | PRN
  Administered 2011-12-07 – 2011-12-11 (×7): 50 mg via ORAL
  Filled 2011-12-07 (×7): qty 1

## 2011-12-07 MED ORDER — AMLODIPINE BESYLATE 10 MG PO TABS
10.0000 mg | ORAL_TABLET | Freq: Every day | ORAL | Status: DC
  Administered 2011-12-07 – 2011-12-11 (×5): 10 mg via ORAL
  Filled 2011-12-07 (×5): qty 1

## 2011-12-07 MED ORDER — DIGOXIN 125 MCG PO TABS
125.0000 ug | ORAL_TABLET | Freq: Every day | ORAL | Status: DC
  Administered 2011-12-07 – 2011-12-11 (×5): 125 ug via ORAL
  Filled 2011-12-07 (×5): qty 1

## 2011-12-07 NOTE — Progress Notes (Signed)
Notified Dr. Imogene Burn, on call, of patient's home medications being DC'd when patient transferred from PACU.  No new orders received; was told medications would be addressed tomorrow morning.  Will continue to monitor.  Vivi Martens RN

## 2011-12-07 NOTE — Telephone Encounter (Signed)
Message copied by Sara Chu on Fri Dec 07, 2011  2:06 PM ------      Message from: [place], [place] K      Created: Fri Dec 07, 2011  8:15 AM      Regarding: Schedule                   ----- Message -----         From: Dara Lords, PA         Sent: 12/07/2011   7:33 AM           To: Sharee Pimple, CMA            S/p       1. Exploration of left femoral pseudoaneurysm        2. Repair of pseudoaneurysm of fem-fem bypass graft        3. Revision of fem-fem bypass graft      By CSD on 12/06/11            F/u with him in 2 weeks.            Thanks,      Lelon Mast

## 2011-12-07 NOTE — Evaluation (Signed)
Physical Therapy Evaluation Patient Details Name: Casey [name] MRN: [id number] DOB: 25-Oct-1940 Today's Date: 12/07/2011 Time: 0850-0910 PT Time Calculation (min): 20 min  PT Assessment / Plan / Recommendation Clinical Impression  Pt is 71 y/o female admitted for s/p bilateral fem-fem bypass graft.  Pt will benefit from acute PT services to improve overall mobility and prepare for safe d/c to next venue.  Pt very limited due to pain and very restless.  Pt wanting to return to bed.    PT Assessment  Patient needs continued PT services    Follow Up Recommendations  SNF    Does the patient have the potential to tolerate intense rehabilitation      Barriers to Discharge Decreased caregiver support      Equipment Recommendations  None recommended by PT    Recommendations for Other Services     Frequency Min 3X/week    Precautions / Restrictions Precautions Precautions: Fall Restrictions Weight Bearing Restrictions: No   Pertinent Vitals/Pain 10/10 all over and restless in recliner; HR increased to 148 with mobility      Mobility  Bed Mobility Bed Mobility: Sit to Supine Sit to Supine: 1: +2 Total assist;HOB flat Sit to Supine: Patient Percentage: 50% Details for Bed Mobility Assistance: +2 (A) to slowly descend upper body into bed and elevate LE.  Cues for proper technique Transfers Transfers: Sit to Stand;Stand to Sit;Stand Pivot Transfers Sit to Stand: 1: +2 Total assist;From chair/3-in-1 Sit to Stand: Patient Percentage: 60% Stand to Sit: 1: +2 Total assist;To bed Stand to Sit: Patient Percentage: 60% Stand Pivot Transfers: 1: +2 Total assist Stand Pivot Transfers: Patient Percentage: 60% Details for Transfer Assistance: +2 (A) to initiate transfer and slowly descend to bed with cues for hand placement.  +2 (A) to maintain balance and max cues for proper step sequence.   Ambulation/Gait Ambulation/Gait Assistance: Not tested (comment)    Shoulder Instructions      Exercises     PT Diagnosis: Difficulty walking;Abnormality of gait;Generalized weakness;Acute pain  PT Problem List: Decreased strength;Decreased range of motion;Decreased activity tolerance;Decreased balance;Decreased mobility;Decreased knowledge of use of DME;Pain PT Treatment Interventions: DME instruction;Gait training;Functional mobility training;Therapeutic activities;Therapeutic exercise;Balance training;Patient/family education   PT Goals Acute Rehab PT Goals PT Goal Formulation: With patient Time For Goal Achievement: 12/14/11 Potential to Achieve Goals: Good Pt will go Supine/Side to Sit: with supervision PT Goal: Supine/Side to Sit - Progress: Goal set today Pt will go Sit to Supine/Side: with supervision PT Goal: Sit to Supine/Side - Progress: Goal set today Pt will go Sit to Stand: with supervision PT Goal: Sit to Stand - Progress: Goal set today Pt will go Stand to Sit: with supervision PT Goal: Stand to Sit - Progress: Goal set today Pt will Transfer Bed to Chair/Chair to Bed: with min assist PT Transfer Goal: Bed to Chair/Chair to Bed - Progress: Goal set today Pt will Ambulate: 16 - 50 feet;with min assist;with rolling walker PT Goal: Ambulate - Progress: Goal set today  Visit Information  Last PT Received On: 12/07/11 Assistance Needed: +2    Subjective Data  Subjective: "I need to move out of this chair." Patient Stated Goal: To eventually return to ALF   Prior Functioning  Home Living Lives With: Alone Available Help at Discharge:  (none- help has to be hired ) Type of Home: Assisted living Home Access: Level entry Home Layout: One level Bathroom Shower/Tub: Health visitor: Standard Home Adaptive Equipment: Straight cane;Shower chair with back Prior  Function Level of Independence: Independent with assistive device(s) Able to Take Stairs?: No Driving: No Vocation: Retired Musician: No difficulties Dominant  Hand: Left    Cognition  Overall Cognitive Status: Appears within functional limits for tasks assessed/performed Arousal/Alertness: Awake/alert Orientation Level: Appears intact for tasks assessed Behavior During Session: Restless (due to pain)    Extremity/Trunk Assessment Right Lower Extremity Assessment RLE ROM/Strength/Tone: Unable to fully assess;Due to pain RLE Sensation:  (H) Left Lower Extremity Assessment LLE ROM/Strength/Tone: Unable to fully assess;Due to pain   Balance Balance Balance Assessed: Yes Static Sitting Balance Static Sitting - Balance Support: Feet supported Static Sitting - Level of Assistance: 5: Stand by assistance Static Sitting - Comment/# of Minutes: ~5 minutes prior to bed mobility  End of Session PT - End of Session Equipment Utilized During Treatment: Gait belt Activity Tolerance: Patient limited by pain Patient left: in bed;with call bell/phone within reach;with nursing in room Nurse Communication: Mobility status  GP     Hildur Bayer 12/07/2011, 10:06 AM Jake Shark, PT DPT [phone]

## 2011-12-07 NOTE — Progress Notes (Signed)
Utilization review completed.  

## 2011-12-07 NOTE — Progress Notes (Signed)
VASCULAR LAB PRELIMINARY  ARTERIAL  ABI completed:    RIGHT    LEFT    PRESSURE WAVEFORM  PRESSURE WAVEFORM  BRACHIAL 115 Triphasic  BRACHIAL 113 Triphasic   DP 65 Monophasic  DP 100 Monophasic   AT   AT    PT   PT 104 Monophasic   PER 41 Monophasic  PER    GREAT TOE  NA GREAT TOE  NA    RIGHT LEFT  ABI 0.57 0.90     Maecy Podgurski, RVT 12/07/2011, 2:58 PM

## 2011-12-07 NOTE — Progress Notes (Signed)
Report called to receiving RN on 2000.  This am, pt misplaced upper and lower partials when she was trying to put them in.  Bed and pt searched unsuccessfully.  Pt's family aware that pt misplaced partials.  Pt transferred to 2040 via wheelchair rest of belongings.   Roselie Awkward, RN

## 2011-12-07 NOTE — Care Management Note (Signed)
    Page 1 of 1   12/11/2011     4:28:23 PM   CARE MANAGEMENT NOTE 12/11/2011  Patient:  Casey [name], Casey [name]   Account Number:  [id number]  Date Initiated:  12/07/2011  Documentation initiated by:  Donn Pierini  Subjective/Objective Assessment:   Pt admitted to 3300 s/p . Exploration of left femoral pseudoaneurysm  2. Repair of pseudoaneurysm of fem-fem bypass graft  3. Revision of fem-fem bypass graft  on 12/06/11     Action/Plan:   PTA pt lived at home alone, PT/OT evals   Anticipated DC Date:  12/10/2011   Anticipated DC Plan:  SKILLED NURSING FACILITY  In-house referral  Clinical Social Worker      DC Planning Services  CM consult      Choice offered to / List presented to:             Status of service:  Completed, signed off Medicare Important Message given?   (If response is "NO", the following Medicare IM given date fields will be blank) Date Medicare IM given:   Date Additional Medicare IM given:    Discharge Disposition:  SKILLED NURSING FACILITY  Per UR Regulation:  Reviewed for med. necessity/level of care/duration of stay  If discussed at Long Length of Stay Meetings, dates discussed:   12/11/2011    Comments:  12/11/11 Birda Didonato,RN,BSN [phone] PT FOR DISCHARGE TO SNF TODAY, PER CSW ARRANGEMENTS.  12/07/11- 1200- Donn Pierini RN, BSN [phone] Pt admitted to 3300 post op - with tx order to 2000 today- PT/OT evals recommending SNF- CSW consulted for possible SNF placement

## 2011-12-07 NOTE — Progress Notes (Signed)
Subjective:  Doing well postoperatively.  No chest pain or shortness of breath.  Expected amount of incisional pain.  Rhythm remains atrial fibrillation with controlled ventricular response.  Blood pressure running slightly high.  Objective:  Vital Signs in the last 24 hours: Temp:  [97.4 F (36.3 C)-99.1 F (37.3 C)] 99.1 F (37.3 C) (11/08 0822) Pulse Rate:  [72-101] 92  (11/08 0411) Resp:  [16-25] 23  (11/08 0411) BP: (144-174)/(51-88) 160/59 mmHg (11/08 0411) SpO2:  [92 %-100 %] 99 % (11/08 0411)  Intake/Output from previous day: 11/07 0701 - 11/08 0700 In: 2975 [I.V.:2525; IV Piggyback:450] Out: 2170 [Urine:1970; Blood:200] Intake/Output from this shift:       . allopurinol  100 mg Oral Daily  . amLODipine  10 mg Oral Daily  . aspirin  81 mg Oral Daily  . [COMPLETED]  ceFAZolin (ANCEF) IV  1 g Intravenous On Call  . digoxin  125 mcg Oral Daily  . docusate sodium  100 mg Oral Daily  . enoxaparin (LOVENOX) injection  70 mg Subcutaneous Q12H  . furosemide  20 mg Oral Daily  . insulin aspart  0-15 Units Subcutaneous TID WC  . insulin aspart  4 Units Subcutaneous TID WC  . insulin glargine  12 Units Subcutaneous QHS  . metFORMIN  500 mg Oral BID WC  . metoprolol succinate  50 mg Oral BID  . oxybutynin  10 mg Oral QHS  . oxybutynin  5 mg Oral Daily  . polyethylene glycol  17 g Oral Daily  . potassium chloride  10 mEq Oral Daily  . simvastatin  20 mg Oral QHS  . vancomycin  1,000 mg Intravenous Q12H  . [DISCONTINUED] allopurinol  100 mg Oral Daily  . [DISCONTINUED] amLODipine  5 mg Oral Daily  . [DISCONTINUED] amLODipine  5 mg Oral Daily  . [DISCONTINUED] aspirin  81 mg Oral Daily  . [DISCONTINUED] digoxin  125 mcg Oral Daily  . [DISCONTINUED] donepezil  10 mg Oral Daily  . [DISCONTINUED] enoxaparin (LOVENOX) injection  70 mg Subcutaneous Q12H  . [DISCONTINUED] furosemide  20 mg Oral Daily  . [DISCONTINUED] insulin aspart  4 Units Subcutaneous TID WC  .  [DISCONTINUED] insulin glargine  12 Units Subcutaneous QHS  . [DISCONTINUED] metoprolol succinate  25 mg Oral BID  . [DISCONTINUED] metoprolol succinate  50 mg Oral Daily  . [DISCONTINUED] oxybutynin  5-10 mg Oral BID  . [DISCONTINUED] pantoprazole  40 mg Oral Daily  . [DISCONTINUED] polyethylene glycol powder  17 g Oral Daily  . [DISCONTINUED] potassium chloride  10 mEq Oral Daily  . [DISCONTINUED] potassium chloride  20-40 mEq Oral Once  . [DISCONTINUED] simvastatin  20 mg Oral QHS      . sodium chloride 75 mL/hr (12/06/11 1645)  . DOPamine    . lactated ringers 50 mL/hr at 12/06/11 1036    Physical Exam: The patient appears to be in no distress.  Head: Normocephalic, atraumatic, sclera non-icteric, no xanthomas, nares are without discharge.  Neck: Bilateral carotid bruits R>L. JVD not elevated.  Lungs: Clear bilaterally to auscultation without wheezes, rales, or rhonchi. Breathing is unlabored.  Heart: Irregularly irregular but rate controlled, with S1 S2. No murmurs, rubs, or gallops appreciated.  Abdomen: General abdomen is soft, nontender and nondistented with normoactive bowel sounds.  Groin incisions intact Msk: Strength and tone appear normal for age.  Extremities: No clubbing or cyanosis. Tr LLE edema. Distal pedal pulses are diminished. Neuro: Alert and oriented X 3. No facial asymmetry. No  focal deficit. Moves all extremities spontaneously.  Psych: Responds to questions appropriately with a normal affect.    Lab Results:  Basename 12/07/11 0442 12/06/11 0440  WBC 7.5 6.6  HGB 11.2* 12.3  PLT 149* 194    Basename 12/07/11 0442 12/06/11 0440  NA 140 140  K 3.8 3.8  CL 106 104  CO2 25 25  GLUCOSE 138* 102*  BUN 10 15  CREATININE 0.94 1.00   No results found for this basename: TROPONINI:2,CK,MB:2 in the last 72 hours Hepatic Function Panel  Basename 12/05/11 2004  PROT 7.5  ALBUMIN 3.7  AST 20  ALT 13  ALKPHOS 95  BILITOT 0.6  BILIDIR --  IBILI --    No results found for this basename: CHOL in the last 72 hours No results found for this basename: PROTIME in the last 72 hours  Imaging: Imaging results have been reviewed  Cardiac Studies: Telemetry shows atrial fibrillation with variable ventricular response. 2D echo shows EF 45% Assessment/Plan:   1. status post resection of pseudoaneurysm of femoral-femoral bypass. 2. Chronic atrial fibrillation, Coumadin presently on hold. 3  Mild LV systolic dysfunction EF 45% 4. H/o CVA  5. HTN  6. Diabetes mellitus  7. Remote hx of lymphoma - will need to f/u with primary doctor to make sure no further surveillance is needed for this as outpatient (normal CBC)  8. Carotid bruits - w/u per vascular   Plan: Continue current medication.  Will increase amlodipine up to 10 mg daily for better systolic blood pressure control.   LOS: 3 days    Cassell Clement 12/07/2011, 8:49 AM

## 2011-12-07 NOTE — Progress Notes (Signed)
ANTICOAGULATION CONSULT NOTE - Follow Up Consult  Pharmacy Consult for Lovenox Indication: atrial fibrillation  Labs:  Basename 12/07/11 0442 12/06/11 0440 12/05/11 2004 12/05/11 1746 12/04/11 1835  HGB 11.2* 12.3 -- -- --  HCT 34.3* 36.5 36.4 -- --  PLT 149* 194 155 -- --  APTT -- -- -- -- 41*  LABPROT 15.3* 16.6* -- 17.1* --  INR 1.23 1.38 -- 1.43 --  HEPARINUNFRC -- -- -- -- --  CREATININE 0.94 1.00 0.99 -- --  CKTOTAL -- -- -- -- --  CKMB -- -- -- -- --  TROPONINI -- -- -- -- --    Assessment: 71yo female had been on Coumadin PTA for Afib, changed to Lovenox to bridge to procedure, now s/p vascular surgery, to resume Lovenox this am with plan to resume Coumadin in a few days.   Plan:  Will resume Lovenox 70mg  SQ Q12H and monitor CBC.  Colleen Can PharmD BCPS 12/07/2011,7:46 AM

## 2011-12-07 NOTE — Evaluation (Signed)
Occupational Therapy Evaluation Patient Details Name: Casey [name] MRN: [id number] DOB: 16-Aug-1940 Today's Date: 12/07/2011 Time: 4010-2725 OT Time Calculation (min): 24 min  OT Assessment / Plan / Recommendation Clinical Impression  Pt s/p revision Lt to Rt. femoral-femoral bypass graft and reapair of left femoral artery pseudoaneurysm. Eval limited by pain. Pt will benefit from skilled OT in the acute sitting to maximize I with ADL and ADL mobility prior to d/c.    OT Assessment  Patient needs continued OT Services    Follow Up Recommendations  SNF    Barriers to Discharge      Equipment Recommendations  None recommended by OT;None recommended by PT    Recommendations for Other Services    Frequency  Min 2X/week    Precautions / Restrictions Precautions Precautions: Fall Restrictions Weight Bearing Restrictions: No   Pertinent Vitals/Pain Pt reports 10/10 "all over" pain, was able to specify pain in LLE. Pt is premedicated and was repositioned at end of session for pain relief    ADL  Lower Body Bathing: +1 Total assistance Where Assessed - Lower Body Bathing: Supported sit to stand Lower Body Dressing: +1 Total assistance Where Assessed - Lower Body Dressing: Supported sit to stand Toilet Transfer: +2 Total assistance Toilet Transfer: Patient Percentage: 60% Toilet Transfer Method: Stand pivot Equipment Used: Gait belt Transfers/Ambulation Related to ADLs: Pt +2total(pt=60%) with stand pivot transfer ADL Comments: Pt limited by pain    OT Diagnosis: Generalized weakness;Acute pain  OT Problem List: Decreased strength;Decreased activity tolerance;Impaired balance (sitting and/or standing);Decreased safety awareness;Decreased knowledge of use of DME or AE;Decreased knowledge of precautions;Cardiopulmonary status limiting activity;Impaired UE functional use;Pain;Increased edema OT Treatment Interventions: Self-care/ADL training;DME and/or AE instruction;Therapeutic  activities;Patient/family education;Balance training   OT Goals Acute Rehab OT Goals OT Goal Formulation: With patient Time For Goal Achievement: 12/21/11 Potential to Achieve Goals: Good ADL Goals Pt Will Perform Grooming: Standing at sink;with supervision ADL Goal: Grooming - Progress: Goal set today Pt Will Perform Upper Body Dressing: Independently;Sitting, bed;Sitting, chair ADL Goal: Upper Body Dressing - Progress: Goal set today Pt Will Perform Lower Body Dressing: with min assist;Sit to stand from bed;Sit to stand from chair ADL Goal: Lower Body Dressing - Progress: Goal set today Pt Will Transfer to Toilet: with supervision;Ambulation;with DME ADL Goal: Toilet Transfer - Progress: Goal set today Pt Will Perform Toileting - Clothing Manipulation: with supervision;Standing ADL Goal: Toileting - Clothing Manipulation - Progress: Goal set today  Visit Information  Last OT Received On: 12/07/11 Assistance Needed: +2    Subjective Data  Subjective: I hurt all over. Patient Stated Goal: Return home   Prior Functioning     Home Living Lives With: Alone Available Help at Discharge:  (none- help has to be hired ) Type of Home: Assisted living Home Access: Level entry Home Layout: One level Bathroom Shower/Tub: Health visitor: Standard Home Adaptive Equipment: Straight cane;Shower chair with back Prior Function Level of Independence: Independent with assistive device(s) Able to Take Stairs?: No Driving: No Vocation: Retired Musician: No difficulties Dominant Hand: Left         Vision/Perception     Cognition  Overall Cognitive Status: Appears within functional limits for tasks assessed/performed Arousal/Alertness: Awake/alert Orientation Level: Appears intact for tasks assessed Behavior During Session: Restless (due to pain)    Extremity/Trunk Assessment Right Upper Extremity Assessment RUE ROM/Strength/Tone:  Deficits RUE ROM/Strength/Tone Deficits: grossly 3-/5 shoulder to wrist; grip grossly 2/5 (premorbid stroke) RUE Coordination: Deficits Left Upper Extremity Assessment  LUE ROM/Strength/Tone: Deficits LUE ROM/Strength/Tone Deficits: grossly 4/5 LUE Coordination: WFL - gross/fine motor Right Lower Extremity Assessment RLE ROM/Strength/Tone: Unable to fully assess;Due to pain RLE Sensation:  (H) Left Lower Extremity Assessment LLE ROM/Strength/Tone: Unable to fully assess;Due to pain     Mobility Bed Mobility Bed Mobility: Sit to Supine Sit to Supine: 1: +2 Total assist;HOB flat Sit to Supine: Patient Percentage: 50% Details for Bed Mobility Assistance: +2 (A) to slowly descend upper body into bed and elevate LE.  Cues for proper technique Transfers Sit to Stand: 1: +2 Total assist;From chair/3-in-1 Sit to Stand: Patient Percentage: 60% Stand to Sit: 1: +2 Total assist;To bed Stand to Sit: Patient Percentage: 60% Details for Transfer Assistance: +2 (A) to initiate transfer and slowly descend to bed with cues for hand placement.  +2 (A) to maintain balance and max cues for proper step sequence.       Shoulder Instructions     Exercise     Balance Balance Balance Assessed: Yes Static Sitting Balance Static Sitting - Balance Support: Feet supported Static Sitting - Level of Assistance: 5: Stand by assistance Static Sitting - Comment/# of Minutes: ~5 minutes prior to bed mobility   End of Session OT - End of Session Equipment Utilized During Treatment: Gait belt Activity Tolerance: Patient tolerated treatment well Patient left: in bed;with call bell/phone within reach Nurse Communication: Mobility status  GO     Corey Laski 12/07/2011, 11:29 AM

## 2011-12-07 NOTE — Progress Notes (Addendum)
[facility] Progress Note  12/07/2011 7:16 AM POD 1  Subjective:  "hurtin' all over this morning"  Tm 99.1 140's-170's systolic HR  80's irreg 99%2LO2NC  Filed Vitals:   12/07/11 0411  BP: 160/59  Pulse: 92  Temp: 98.9 F (37.2 C)  Resp: 23    Physical Exam: Incisions:  Bilateral groin incisions are c/d/i without hematoma; tender to palpation over pubis area. Extremities:  Bilateral feet are warm and well perfused.  CBC    Component Value Date/Time   WBC 7.5 12/07/2011 0442   RBC 4.08 12/07/2011 0442   HGB 11.2* 12/07/2011 0442   HCT 34.3* 12/07/2011 0442   PLT 149* 12/07/2011 0442   MCV 84.1 12/07/2011 0442   MCH 27.5 12/07/2011 0442   MCHC 32.7 12/07/2011 0442   RDW 14.8 12/07/2011 0442   LYMPHSABS 1.9 12/04/2011 1835   MONOABS 0.7 12/04/2011 1835   EOSABS 0.3 12/04/2011 1835   BASOSABS 0.0 12/04/2011 1835    BMET    Component Value Date/Time   NA 140 12/07/2011 0442   K 3.8 12/07/2011 0442   CL 106 12/07/2011 0442   CO2 25 12/07/2011 0442   GLUCOSE 138* 12/07/2011 0442   BUN 10 12/07/2011 0442   CREATININE 0.94 12/07/2011 0442   CALCIUM 8.9 12/07/2011 0442   GFRNONAA 60* 12/07/2011 0442   GFRAA 69* 12/07/2011 0442    INR    Component Value Date/Time   INR 1.23 12/07/2011 0442   INR 3.0 11/05/2011 1325     Intake/Output Summary (Last 24 hours) at 12/07/11 0716 Last data filed at 12/07/11 0600  Gross per 24 hour  Intake   2975 ml  Output   2170 ml  Net    805 ml     Assessment/Plan:  71 y.o. female is s/p  1. Exploration of left femoral pseudoaneurysm  2. Repair of pseudoaneurysm of fem-fem bypass graft  3. Revision of fem-fem bypass graft   POD 1  -doing well this am -start lovenox this am for Afib-will start coumadin in a few days. -mobilize  -transfer to 2000  Doreatha Massed, PA-C [facility] [phone] 12/07/2011 7:16 AM  Feet warm Incisions look fine. Suprapubic area, soft Transfer to 2000. Stop  Coumadin tomorrow PM per pharmacy  Di Kindle. Edilia Bo, MD, FACS Beeper [phone] 12/07/2011

## 2011-12-08 LAB — GLUCOSE, CAPILLARY
Glucose-Capillary: 128 mg/dL — ABNORMAL HIGH (ref 70–99)
Glucose-Capillary: 81 mg/dL (ref 70–99)
Glucose-Capillary: 242 mg/dL — ABNORMAL HIGH (ref 70–99)

## 2011-12-08 MED ORDER — WARFARIN - PHARMACIST DOSING INPATIENT
Freq: Every day | Status: DC
  Administered 2011-12-10: 18:00:00

## 2011-12-08 MED ORDER — WARFARIN SODIUM 7.5 MG PO TABS
7.5000 mg | ORAL_TABLET | Freq: Once | ORAL | Status: AC
  Administered 2011-12-08: 7.5 mg via ORAL
  Filled 2011-12-08: qty 1

## 2011-12-08 NOTE — Progress Notes (Signed)
ANTICOAGULATION CONSULT NOTE - Initial Consult  Pharmacy Consult for Coumadin Indication: atrial fibrillation  Allergies  Allergen Reactions  . Penicillins Other (See Comments)    Patient states that she was told previously by a doctor to not take this medication but is unsure why.     Patient Measurements: Height: 5\' 5"  (165.1 cm) Weight: 152 lb 6.4 oz (69.128 kg) IBW/kg (Calculated) : 57   Vital Signs: Temp: 98.5 F (36.9 C) (11/08 2025) Temp src: Oral (11/08 2025) BP: 136/60 mmHg (11/08 2025) Pulse Rate: 81  (11/08 2025)  Labs:  Basename 12/07/11 0442 12/06/11 0440 12/05/11 2004 12/05/11 1746  HGB 11.2* 12.3 -- --  HCT 34.3* 36.5 36.4 --  PLT 149* 194 155 --  APTT -- -- -- --  LABPROT 15.3* 16.6* -- 17.1*  INR 1.23 1.38 -- 1.43  HEPARINUNFRC -- -- -- --  CREATININE 0.94 1.00 0.99 --  CKTOTAL -- -- -- --  CKMB -- -- -- --  TROPONINI -- -- -- --    Estimated Creatinine Clearance: 53.6 ml/min (by C-G formula based on Cr of 0.94).   Medical History: Past Medical History  Diagnosis Date  . Chronic atrial fibrillation   . Stroke   . Hypertension   . Hypercholesterolemia   . Diabetes mellitus   . Sinus of Valsalva aneurysm     a. By 2D echo 05/2011.  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  . CHF (congestive heart failure)   . GERD (gastroesophageal reflux disease)     Medications:  Prescriptions prior to admission  Medication Sig Dispense Refill  . allopurinol (ZYLOPRIM) 100 MG tablet Take 100 mg by mouth daily.        Marland Kitchen amLODipine (NORVASC) 5 MG tablet Take 5 mg by mouth daily.        Marland Kitchen aspirin 81 MG tablet Take 1 tablet (81 mg total) by mouth daily.  30 tablet  11  . digoxin (LANOXIN) 0.125 MG tablet Take 125 mcg by mouth daily.        Marland Kitchen donepezil (ARICEPT) 10 MG tablet Take 10 mg by mouth daily.      . furosemide (LASIX) 20 MG  tablet Take 20 mg by mouth daily.        . insulin aspart (NOVOLOG) 100 UNIT/ML injection Inject 4 Units into the skin 3 (three) times daily with meals.  1 vial  0  . insulin glargine (LANTUS) 100 UNIT/ML injection Inject 12 Units into the skin at bedtime.  10 mL  0  . metFORMIN (GLUCOPHAGE) 500 MG tablet Take 1 tablet (500 mg total) by mouth 2 (two) times daily with a meal.  60 tablet  0  . metoprolol succinate (TOPROL-XL) 50 MG 24 hr tablet Take 50 mg by mouth 2 (two) times daily. Take with or immediately following a meal.      . oxybutynin (DITROPAN) 5 MG tablet Take 5-10 mg by mouth 2 (two) times daily. Take 1 tab in the am and 2 tabs at night      . polyethylene glycol powder (GLYCOLAX/MIRALAX) powder Take 17 g by mouth daily.       . potassium chloride (K-DUR,KLOR-CON) 10 MEQ tablet Take 10 mEq by mouth daily.        . simvastatin (ZOCOR) 20 MG tablet Take 1 tablet (20 mg total) by mouth at bedtime.  30 tablet  11  . traMADol (ULTRAM) 50 MG tablet Take 50 mg by mouth 3 (three) times daily as needed. For pain      . warfarin (COUMADIN) 5 MG tablet Take 2.5-5 mg by mouth daily. 1 tablet daily except 1/2 tablet on Tuesdays and Saturdays.       Scheduled:    . allopurinol  100 mg Oral Daily  . amLODipine  10 mg Oral Daily  . aspirin  81 mg Oral Daily  . digoxin  125 mcg Oral Daily  . docusate sodium  100 mg Oral Daily  . donepezil  10 mg Oral Daily  . enoxaparin (LOVENOX) injection  70 mg Subcutaneous Q12H  . furosemide  20 mg Oral Daily  . influenza  inactive virus vaccine  0.5 mL Intramuscular Tomorrow-1000  . insulin aspart  0-15 Units Subcutaneous TID WC  . insulin aspart  4 Units Subcutaneous TID WC  . insulin glargine  12 Units Subcutaneous QHS  . metFORMIN  500 mg Oral BID WC  . metoprolol succinate  50 mg Oral BID  . oxybutynin  10 mg Oral QHS  . oxybutynin  5 mg Oral Daily  . oxybutynin  5-10 mg Oral BID  . polyethylene glycol powder  17 g Oral Daily  . potassium chloride   10 mEq Oral Daily  . simvastatin  20 mg Oral QHS  . [COMPLETED] vancomycin  1,000 mg Intravenous Q12H  . warfarin  7.5 mg Oral ONCE-1800  . Warfarin - Pharmacist Dosing Inpatient   Does not apply q1800  . [DISCONTINUED] amLODipine  5 mg Oral Daily  . [DISCONTINUED] oxybutynin  5-10 mg Oral BID  . [DISCONTINUED] polyethylene glycol  17 g Oral Daily  . [DISCONTINUED] polyethylene glycol powder  17 g Oral Daily    Assessment: 71yo female now to resume Coumadin s/p vascular surgery; has been on Lovenox bridge.  Goal of Therapy:  INR 2-3   Plan:  Will give boosted Coumadin dose of 7.5mg  po x1 this evening and monitor INR for dose adjustments prior to resuming home regimen.  Colleen Can PharmD BCPS 12/08/2011,1:00 AM

## 2011-12-08 NOTE — Progress Notes (Signed)
VASCULAR PROGRESS NOTE  SUBJECTIVE: No complaints. She lives in an apartment alone, but says that she does fine by herself.  PHYSICAL EXAM: Filed Vitals:   12/07/11 1200 12/07/11 1552 12/07/11 2025 12/08/11 0503  BP: 125/48 134/73 136/60 129/59  Pulse: 89  81 82  Temp: 98.8 F (37.1 C)  98.5 F (36.9 C) 99.3 F (37.4 C)  TempSrc: Oral  Oral Oral  Resp: 25  20 18   Height:      Weight:      SpO2: 95% 96% 96% 98%   Groin incisions look fine.  Feet warm  LABS: Lab Results  Component Value Date   WBC 7.5 12/07/2011   HGB 11.2* 12/07/2011   HCT 34.3* 12/07/2011   MCV 84.1 12/07/2011   PLT 149* 12/07/2011   Lab Results  Component Value Date   CREATININE 0.94 12/07/2011   Lab Results  Component Value Date   INR 1.22 12/08/2011   CBG (last 3)   Basename 12/07/11 2124 12/07/11 1613 12/07/11 1225  GLUCAP 142* 90 138*   ASSESSMENT/PLAN: 1. 2 Days Post-Op s/p: Repair of pseudoaneurysm left limb of femfem graft. 2. Lovenox and Coumadin. If she is D/C'd before coumadin is therapeutic, she will need Lovenox bridge. 3. She does not feel that she needs SNF. However, PTx recommends this. Will see how she does with PT over next day or 2. If slow progress then agree with SNF. Otherwise, will try to get her home with HHPT.  Lovenia Shuck, MD, FACS Beeper: [phone] 12/08/2011

## 2011-12-08 NOTE — Progress Notes (Signed)
Notified Dr. Edilia Bo of CBG 86. Orders were given to hold scheduled Lantus for tonight. Will continue to monitor.

## 2011-12-08 NOTE — Progress Notes (Signed)
Subjective:  Doing well postoperatively.  No chest pain or shortness of breath.  Expected amount of incisional pain.  Rhythm remains atrial fibrillation with controlled ventricular response.  Blood pressure stable.  Objective:  Vital Signs in the last 24 hours: Temp:  [98.5 F (36.9 C)-99.3 F (37.4 C)] 99.3 F (37.4 C) (11/09 0503) Pulse Rate:  [73-82] 73  (11/09 1051) Resp:  [18-20] 18  (11/09 0503) BP: (101-136)/(59-73) 101/61 mmHg (11/09 1051) SpO2:  [96 %-98 %] 98 % (11/09 0503)  Intake/Output from previous day: 11/08 0701 - 11/09 0700 In: 680 [P.O.:480; IV Piggyback:200] Out: 225 [Urine:225] Intake/Output from this shift: Total I/O In: 240 [P.O.:240] Out: -      . allopurinol  100 mg Oral Daily  . amLODipine  10 mg Oral Daily  . aspirin  81 mg Oral Daily  . digoxin  125 mcg Oral Daily  . docusate sodium  100 mg Oral Daily  . donepezil  10 mg Oral Daily  . enoxaparin (LOVENOX) injection  70 mg Subcutaneous Q12H  . furosemide  20 mg Oral Daily  . influenza  inactive virus vaccine  0.5 mL Intramuscular Tomorrow-1000  . insulin aspart  0-15 Units Subcutaneous TID WC  . insulin aspart  4 Units Subcutaneous TID WC  . insulin glargine  12 Units Subcutaneous QHS  . metFORMIN  500 mg Oral BID WC  . metoprolol succinate  50 mg Oral BID  . oxybutynin  10 mg Oral QHS  . oxybutynin  5 mg Oral Daily  . oxybutynin  5-10 mg Oral BID  . polyethylene glycol powder  17 g Oral Daily  . potassium chloride  10 mEq Oral Daily  . simvastatin  20 mg Oral QHS  . warfarin  7.5 mg Oral ONCE-1800  . Warfarin - Pharmacist Dosing Inpatient   Does not apply q1800  . [DISCONTINUED] polyethylene glycol  17 g Oral Daily      . [DISCONTINUED] sodium chloride 75 mL/hr (12/06/11 1645)  . [DISCONTINUED] DOPamine    . [DISCONTINUED] lactated ringers 50 mL/hr at 12/06/11 1036    Physical Exam: The patient appears to be in no distress.  Head: Normocephalic, atraumatic, sclera non-icteric,  no xanthomas, nares are without discharge.  Neck: Bilateral carotid bruits R>L. JVD not elevated.  Lungs: Clear bilaterally to auscultation without wheezes, rales, or rhonchi. Breathing is unlabored.  Heart: Irregularly irregular but rate controlled, with S1 S2. No murmurs, rubs, or gallops appreciated.  Abdomen: General abdomen is soft, nontender and nondistented with normoactive bowel sounds.  Groin incisions intact Msk: Strength and tone appear normal for age.  Extremities: No clubbing or cyanosis. Tr LLE edema. Distal pedal pulses are diminished. Neuro: Alert and oriented X 3. No facial asymmetry. No focal deficit. Moves all extremities spontaneously.  Psych: Responds to questions appropriately with a normal affect.    Lab Results:  Basename 12/07/11 0442 12/06/11 0440  WBC 7.5 6.6  HGB 11.2* 12.3  PLT 149* 194    Basename 12/07/11 0442 12/06/11 0440  NA 140 140  K 3.8 3.8  CL 106 104  CO2 25 25  GLUCOSE 138* 102*  BUN 10 15  CREATININE 0.94 1.00   No results found for this basename: TROPONINI:2,CK,MB:2 in the last 72 hours Hepatic Function Panel  Basename 12/05/11 2004  PROT 7.5  ALBUMIN 3.7  AST 20  ALT 13  ALKPHOS 95  BILITOT 0.6  BILIDIR --  IBILI --   No results found for this basename:  CHOL in the last 72 hours No results found for this basename: PROTIME in the last 72 hours  Imaging: Imaging results have been reviewed  Cardiac Studies: Telemetry shows atrial fibrillation with variable ventricular response. 2D echo shows EF 45% Assessment/Plan:   1. status post resection of pseudoaneurysm of femoral-femoral bypass. 2. Chronic atrial fibrillation, Coumadin presently on hold. 3  Mild LV systolic dysfunction EF 45% 4. H/o CVA  5. HTN  6. Diabetes mellitus  7. Remote hx of lymphoma - will need to f/u with primary doctor to make sure no further surveillance is needed for this as outpatient (normal CBC)  8. Carotid bruits - w/u per vascular   Plan:  Continue current medication.  Warfarin has been restarted. Physical therapy working with her.    LOS: 4 days    Cassell Clement 12/08/2011, 12:30 PM

## 2011-12-09 LAB — GLUCOSE, CAPILLARY
Glucose-Capillary: 163 mg/dL — ABNORMAL HIGH (ref 70–99)
Glucose-Capillary: 160 mg/dL — ABNORMAL HIGH (ref 70–99)

## 2011-12-09 LAB — CBC
HCT: 30.1 % — ABNORMAL LOW (ref 36.0–46.0)
Hemoglobin: 10 g/dL — ABNORMAL LOW (ref 12.0–15.0)
MCHC: 33.2 g/dL (ref 30.0–36.0)
RDW: 14.7 % (ref 11.5–15.5)
WBC: 8.3 10*3/uL (ref 4.0–10.5)

## 2011-12-09 LAB — PROTIME-INR
INR: 1.16 (ref 0.00–1.49)
Prothrombin Time: 14.6 seconds (ref 11.6–15.2)

## 2011-12-09 MED ORDER — ENOXAPARIN SODIUM 100 MG/ML ~~LOC~~ SOLN
100.0000 mg | SUBCUTANEOUS | Status: DC
  Administered 2011-12-09 – 2011-12-11 (×3): 100 mg via SUBCUTANEOUS
  Filled 2011-12-09 (×4): qty 1

## 2011-12-09 MED ORDER — WARFARIN SODIUM 7.5 MG PO TABS
7.5000 mg | ORAL_TABLET | Freq: Once | ORAL | Status: AC
  Administered 2011-12-09: 7.5 mg via ORAL
  Filled 2011-12-09: qty 1

## 2011-12-09 MED ORDER — ENOXAPARIN (LOVENOX) PATIENT EDUCATION KIT
PACK | Freq: Once | Status: AC
  Administered 2011-12-09: 09:00:00
  Filled 2011-12-09: qty 1

## 2011-12-09 NOTE — Progress Notes (Signed)
Subjective:  Doing well postoperatively.  No chest pain or shortness of breath.  Still having some operative site discomfort.  She is looking forward to ambulating with physical therapy later today. Rhythm remains atrial fibrillation with controlled ventricular response.  Blood pressure stable.  Objective:  Vital Signs in the last 24 hours: Temp:  [98.6 F (37 C)-99.5 F (37.5 C)] 98.7 F (37.1 C) (11/10 0421) Pulse Rate:  [75-87] 87  (11/10 0421) Resp:  [18-20] 18  (11/10 0421) BP: (116-120)/(55-64) 118/58 mmHg (11/10 0421) SpO2:  [97 %-98 %] 97 % (11/10 0421)  Intake/Output from previous day: 11/09 0701 - 11/10 0700 In: 480 [P.O.:480] Out: 400 [Urine:400] Intake/Output from this shift:       . allopurinol  100 mg Oral Daily  . amLODipine  10 mg Oral Daily  . aspirin  81 mg Oral Daily  . digoxin  125 mcg Oral Daily  . docusate sodium  100 mg Oral Daily  . donepezil  10 mg Oral Daily  . enoxaparin (LOVENOX) injection  100 mg Subcutaneous Q24H  . [COMPLETED] enoxaparin   Does not apply Once  . furosemide  20 mg Oral Daily  . [COMPLETED] influenza  inactive virus vaccine  0.5 mL Intramuscular Tomorrow-1000  . insulin aspart  0-15 Units Subcutaneous TID WC  . insulin aspart  4 Units Subcutaneous TID WC  . insulin glargine  12 Units Subcutaneous QHS  . metFORMIN  500 mg Oral BID WC  . metoprolol succinate  50 mg Oral BID  . oxybutynin  10 mg Oral QHS  . oxybutynin  5 mg Oral Daily  . polyethylene glycol powder  17 g Oral Daily  . potassium chloride  10 mEq Oral Daily  . simvastatin  20 mg Oral QHS  . [COMPLETED] warfarin  7.5 mg Oral ONCE-1800  . warfarin  7.5 mg Oral ONCE-1800  . Warfarin - Pharmacist Dosing Inpatient   Does not apply q1800  . [DISCONTINUED] enoxaparin (LOVENOX) injection  70 mg Subcutaneous Q12H  . [DISCONTINUED] oxybutynin  5-10 mg Oral BID      Physical Exam: The patient appears to be in no distress.  Head: Normocephalic, atraumatic, sclera  non-icteric, no xanthomas, nares are without discharge.  Neck: Bilateral carotid bruits R>L. JVD not elevated.  Lungs: Clear bilaterally to auscultation without wheezes, rales, or rhonchi. Breathing is unlabored.  Heart: Irregularly irregular but rate controlled, with S1 S2. No murmurs, rubs, or gallops appreciated.  Abdomen: General abdomen is soft, nontender and nondistented with normoactive bowel sounds.  Groin incisions intact Msk: Strength and tone appear normal for age.  Extremities: No clubbing or cyanosis. Tr LLE edema. Distal pedal pulses are diminished. Neuro: Alert and oriented X 3. No facial asymmetry. No focal deficit. Moves all extremities spontaneously.  Psych: Responds to questions appropriately with a normal affect.    Lab Results:  Basename 12/09/11 0635 12/07/11 0442  WBC 8.3 7.5  HGB 10.0* 11.2*  PLT 181 149*    Basename 12/07/11 0442  NA 140  K 3.8  CL 106  CO2 25  GLUCOSE 138*  BUN 10  CREATININE 0.94   No results found for this basename: TROPONINI:2,CK,MB:2 in the last 72 hours Hepatic Function Panel No results found for this basename: PROT,ALBUMIN,AST,ALT,ALKPHOS,BILITOT,BILIDIR,IBILI in the last 72 hours No results found for this basename: CHOL in the last 72 hours No results found for this basename: PROTIME in the last 72 hours  Imaging: Imaging results have been reviewed  Cardiac Studies:  Telemetry shows atrial fibrillation with variable ventricular response. 2D echo shows EF 45% Assessment/Plan:   1. status post resection of pseudoaneurysm of femoral-femoral bypass. 2. Chronic atrial fibrillation, Coumadin presently on hold. 3  Mild LV systolic dysfunction EF 45% 4. H/o CVA  5. HTN  6. Diabetes mellitus  7. Remote hx of lymphoma - will need to f/u with primary doctor to make sure no further surveillance is needed for this as outpatient (normal CBC)  8. Carotid bruits - w/u per vascular   Plan: Continue current medication.  Warfarin is not  yet therapeutic.  Anticipate home soon with home health physical therapy and with Lovenox bridging back to therapeutic Coumadin levels.    LOS: 5 days    Cassell Clement 12/09/2011, 12:05 PM

## 2011-12-09 NOTE — Progress Notes (Signed)
Pt ambulated with two people assisting and rolling walker. Pt very limited by pain and fatigue; only able to walk approx 75 feet. Pt does not have a steady gait and required a lot of help walking. Placed pt back to bed with call bell in reach.

## 2011-12-09 NOTE — Progress Notes (Signed)
ANTICOAGULATION CONSULT NOTE - Initial Consult  Pharmacy Consult for Lovenox/Coumadin Indication: atrial fibrillation  Allergies  Allergen Reactions  . Penicillins Other (See Comments)    Patient states that she was told previously by a doctor to not take this medication but is unsure why.     Patient Measurements: Height: 5\' 5"  (165.1 cm) Weight: 152 lb 6.4 oz (69.128 kg) IBW/kg (Calculated) : 57  Heparin Dosing Weight:    Vital Signs: Temp: 98.7 F (37.1 C) (11/10 0421) Temp src: Oral (11/10 0421) BP: 118/58 mmHg (11/10 0421) Pulse Rate: 87  (11/10 0421)  Labs:  Basename 12/09/11 0635 12/08/11 0500 12/07/11 0442  HGB 10.0* -- 11.2*  HCT 30.1* -- 34.3*  PLT 181 -- 149*  APTT -- -- --  LABPROT 14.6 15.2 15.3*  INR 1.16 1.22 1.23  HEPARINUNFRC -- -- --  CREATININE -- -- 0.94  CKTOTAL -- -- --  CKMB -- -- --  TROPONINI -- -- --    Estimated Creatinine Clearance: 53.6 ml/min (by C-G formula based on Cr of 0.94).   Medical History: Past Medical History  Diagnosis Date  . Chronic atrial fibrillation   . Stroke   . Hypertension   . Hypercholesterolemia   . Diabetes mellitus   . Sinus of Valsalva aneurysm     a. By 2D echo 05/2011.  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  . CHF (congestive heart failure)   . GERD (gastroesophageal reflux disease)     Assessment: 71 y.o. female presents with pulsatile mass L lower abdomen - found to have large pseudoaneurysm of fem-fem bypass graft. OR on 11/7 for repair of pseudoaneurysm left limb of femfem graft.  Anticoag: Lovenox to change to once daily dosing for discharge. INR 1.16. Hgb down to 10. Platelets stable. CHADS2 score=4 (Home Coumadin 5mg  qMWThFSUN & 2.5MG  qTSat)  ID: WBC 8.3, Tm 99.5  Cardiovas: Hx afib,, HTN: VSS/S/p ECHO 11/6 with EF 45%   Meds: amlo, asa 81mg , dig, furosemide,  metoprolol, simvastatin, K  Endocrinology: Hx DM: CBGs 81-242 on metformin, Lantus, SSI. Allopurinol for gout GI / Nutrition:  Neuro: hx stroke. Meds: Aricept Nephrology: oxybutynin, Scr 0.94 stable Pulm: RA Heme / Onc: hx lymphoma 1980s, Hgb down to 10  Goal of Therapy:  INR 2-3 Monitor platelets by anticoagulation protocol: Yes   Plan:  Lovenox 100mg  sq once daily. Coumadin 7.5mg  po x1 again today.  Casey [name], Casey [name] 12/09/2011,8:39 AM

## 2011-12-09 NOTE — Progress Notes (Signed)
VASCULAR PROGRESS NOTE  SUBJECTIVE: No specific complaints.   PHYSICAL EXAM: Filed Vitals:   12/08/11 1051 12/08/11 1544 12/08/11 2040 12/09/11 0421  BP: 101/61 120/55 116/64 118/58  Pulse: 73 75 86 87  Temp:  98.6 F (37 C) 99.5 F (37.5 C) 98.7 F (37.1 C)  TempSrc:  Oral Oral Oral  Resp:  18 20 18   Height:      Weight:      SpO2:  98% 98% 97%   Groin incisions look fine.  Both feet warm.  LABS: Lab Results  Component Value Date   WBC 8.3 12/09/2011   HGB 10.0* 12/09/2011   HCT 30.1* 12/09/2011   MCV 82.9 12/09/2011   PLT 181 12/09/2011   CBG (last 3)   Basename 12/09/11 0604 12/08/11 2104 12/08/11 1623  GLUCAP 163* 86 242*    ASSESSMENT/PLAN: 1. 3 Days Post-Op s/p: Repair of pseudoaneurysm of left limb of right to left fem fem bypass. 2. Chronic A Fib. Followed by Dr. Patty Sermons. Coumadin started yesterday. Needs Lovenox bridge. If goes home, may need HHN to give SQ Lovenox.  3. PTx last worked with her on Friday. They had recommended SNF, but patient wants to go home. Will see how she does with next PTx session. If does well, could go home with HHPT.   Waverly Ferrari, MD, FACS Beeper: [phone] 12/09/2011

## 2011-12-09 NOTE — Progress Notes (Signed)
Educated pt about how to give lovenox injections. Pt will return demonstration tomorrow. Gave pt lovenox starter kit and showed her the contents along with the book.

## 2011-12-10 DIAGNOSIS — E7800 Pure hypercholesterolemia, unspecified: Secondary | ICD-10-CM

## 2011-12-10 LAB — GLUCOSE, CAPILLARY: Glucose-Capillary: 142 mg/dL — ABNORMAL HIGH (ref 70–99)

## 2011-12-10 MED ORDER — POLYETHYLENE GLYCOL 3350 17 G PO PACK
17.0000 g | PACK | Freq: Every day | ORAL | Status: DC
  Administered 2011-12-10 – 2011-12-11 (×2): 17 g via ORAL
  Filled 2011-12-10 (×2): qty 1

## 2011-12-10 MED ORDER — WARFARIN SODIUM 5 MG PO TABS
5.0000 mg | ORAL_TABLET | Freq: Once | ORAL | Status: AC
  Administered 2011-12-10: 5 mg via ORAL
  Filled 2011-12-10: qty 1

## 2011-12-10 NOTE — Progress Notes (Signed)
Subjective: No CP or SOB Objective: Filed Vitals:   12/09/11 2155 12/10/11 0530 12/10/11 0558 12/10/11 0638  BP: 120/72 114/69    Pulse: 95 65    Temp:  98.5 F (36.9 C)    TempSrc:  Oral    Resp:    17  Height:      Weight:   154 lb 12.8 oz (70.217 kg)   SpO2:  98%     Weight change:   Intake/Output Summary (Last 24 hours) at 12/10/11 0756 Last data filed at 12/09/11 1735  Gross per 24 hour  Intake    720 ml  Output      0 ml  Net    720 ml    General: Alert, awake, oriented x3, in no acute distress Neck:  JVP is normal Heart: Irregular rate and rhythm, without murmurs, rubs, gallops.  Lungs: Clear to auscultation.  No rales or wheezes. Exemities:  No edema.   Neuro: Grossly intact, nonfocal.  Te;e"  Afib  80s  Lab Results: Results for orders placed during the hospital encounter of 12/04/11 (from the past 24 hour(s))  GLUCOSE, CAPILLARY     Status: Abnormal   Collection Time   12/09/11 11:03 AM      Component Value Range   Glucose-Capillary 160 (*) 70 - 99 mg/dL  GLUCOSE, CAPILLARY     Status: Abnormal   Collection Time   12/09/11  4:10 PM      Component Value Range   Glucose-Capillary 135 (*) 70 - 99 mg/dL  GLUCOSE, CAPILLARY     Status: Abnormal   Collection Time   12/09/11  9:13 PM      Component Value Range   Glucose-Capillary 133 (*) 70 - 99 mg/dL  PROTIME-INR     Status: Abnormal   Collection Time   12/10/11  5:38 AM      Component Value Range   Prothrombin Time 15.4 (*) 11.6 - 15.2 seconds   INR 1.24  0.00 - 1.49  GLUCOSE, CAPILLARY     Status: Abnormal   Collection Time   12/10/11  6:04 AM      Component Value Range   Glucose-Capillary 122 (*) 70 - 99 mg/dL    Studies/Results: @RISRSLT24 @  Medications: Reviewed   Patient Active Hospital Problem List:  1.  Afib.  Rates controlled.  Patient to get instruction on Lovenox injection for bridging until coumadin therapeutic. HHN can check  INR if giving lovenox.  WIll review with our coumadin  clinic. WIll make sure she has f/u in clinic near future.  LOS: 6 days   Dietrich Pates 12/10/2011, 7:56 AM

## 2011-12-10 NOTE — Clinical Social Work Psychosocial (Signed)
Clinical Social Work Department BRIEF PSYCHOSOCIAL ASSESSMENT 12/10/2011  Patient:  Casey [name], Casey [name]     Account Number:  [id number]     Admit date:  12/04/2011  Clinical Social Worker:  Thomasene Mohair  Date/Time:  12/10/2011 11:00 AM  Referred by:  Physician  Date Referred:  12/07/2011 Referred for  SNF Placement   Other Referral:   Interview type:  Patient Other interview type:    PSYCHOSOCIAL DATA Living Status:  ALONE Admitted from facility:   Level of care:   Primary support name:  Amie Critchley [phone] Primary support relationship to patient:  CHILD, ADULT Degree of support available:   Lives out of area, but is a good support emotionally    CURRENT CONCERNS Current Concerns  Post-Acute Placement   Other Concerns:    SOCIAL WORK ASSESSMENT / PLAN CSW was referred to P tto assisst with dc planning. Pt lives alone in senior living apartments. She only has 1 cousin in the area with whom she talks to on the phone but does not see too often. Pt has 2 adult children who live in [place].  Pt is requiring assistance with mobility and does not have support at home.  CSW began snf bed seach, Pt likely ready for dc today.   Assessment/plan status:  Psychosocial Support/Ongoing Assessment of Needs Other assessment/ plan:   Information/referral to community resources:    PATIENT'S/FAMILY'S RESPONSE TO PLAN OF CARE: Pt has been to snf in the past and would prefer to go home. Pt shared that she worries about "not being able to go home once there." CSW offered reassurance that SNF recommendation is based on goal for rehab and go home to independent living.  Pt plans to work with therapist today to see how she has improved. Pt is agreeable to CSW searcing for SNF bed.   Frederico Hamman, LCSW [phone]

## 2011-12-10 NOTE — Progress Notes (Addendum)
[facility]  Progress Note Bypass Surgery  Date of Surgery: 12/04/2011 - 12/06/2011  Procedure(s): Left FEMORAL ARTERY EXPLORATION BYPASS GRAFT FEMORAL-FEMORAL ARTERY REPAIR FALSE ANEURYSM Surgeon: Surgeon(s): Chuck Hint, MD  4 Days Post-Op  History of Present Illness  Casey [name] is a 71 y.o. female who is doing well, but slow to progress with ambulation. She is 2 assist to transfer at this point. Pt wants to go back to assisted living. She is on coumadin and can receive lovenox bridging. She would minimally need HHPT and RN   Significant Diagnostic Studies: CBC Lab Results  Component Value Date   WBC 8.3 12/09/2011   HGB 10.0* 12/09/2011   HCT 30.1* 12/09/2011   MCV 82.9 12/09/2011   PLT 181 12/09/2011    BMET     Component Value Date/Time   NA 140 12/07/2011 0442   K 3.8 12/07/2011 0442   CL 106 12/07/2011 0442   CO2 25 12/07/2011 0442   GLUCOSE 138* 12/07/2011 0442   BUN 10 12/07/2011 0442   CREATININE 0.94 12/07/2011 0442   CALCIUM 8.9 12/07/2011 0442   GFRNONAA 60* 12/07/2011 0442   GFRAA 69* 12/07/2011 0442    COAG Lab Results  Component Value Date   INR 1.24 12/10/2011   INR 1.16 12/09/2011   INR 1.22 12/08/2011   No results found for this basename: PTT    Physical Examination  BP Readings from Last 3 Encounters:  12/10/11 114/69  12/10/11 114/69  12/04/11 160/82   Temp Readings from Last 3 Encounters:  12/10/11 98.5 F (36.9 C) Oral  12/10/11 98.5 F (36.9 C) Oral  06/21/11 97.4 F (36.3 C) Oral   SpO2 Readings from Last 3 Encounters:  12/10/11 98%  12/10/11 98%  12/04/11 97%   Pulse Readings from Last 3 Encounters:  12/10/11 65  12/10/11 65  12/04/11 63    Pt is A&O x 3 left lower extremity: Incision/s is/are clean,dry.intact, and  healing without hematoma, erythema or drainage Limb is warm; with good color  Assessment/Plan: Pt. Doing well except slow mobilization, still requiring mod  assist Post-op pain is controlled Wounds are healing well PT/OT for ambulation - await further recommendations Continue wound care as ordered  Marlowe Shores [phone] 12/10/2011 9:53 AM  Waiting to see how she does with physical therapy. If she does well then she can go home with home health physical therapy. She will need a Lovenox bridge until her Coumadin is therapeutic as per Dr. Patty Sermons. Possibly home tomorrow.  Cari Caraway Beeper [phone] 12/10/2011

## 2011-12-10 NOTE — Progress Notes (Signed)
Physical Therapy Treatment Patient Details Name: Casey [name] MRN: [id number] DOB: [birth year] Today's Date: 12/10/2011 Time: 5284-1324 PT Time Calculation (min): 36 min  PT Assessment / Plan / Recommendation Comments on Treatment Session  pt's gait improved, but she is unrealistic about  some of her abilities, incl having difficulty getting in the bed    Follow Up Recommendations  SNF     Does the patient have the potential to tolerate intense rehabilitation     Barriers to Discharge        Equipment Recommendations  None recommended by PT    Recommendations for Other Services    Frequency Min 3X/week   Plan Discharge plan remains appropriate;Frequency remains appropriate    Precautions / Restrictions Precautions Precautions: Fall   Pertinent Vitals/Pain     Mobility  Bed Mobility Bed Mobility: Sit to Supine Sit to Supine: HOB flat;3: Mod assist Details for Bed Mobility Assistance: Vc's for help problem solving ways to get her legs into the bed given the pain in her incision areas. Transfers Transfers: Sit to Stand;Stand to Sit Sit to Stand: 4: Min assist Stand to Sit: 4: Min guard Details for Transfer Assistance: vc's for hand placement and minaor steadying assist to stand Ambulation/Gait Ambulation/Gait Assistance: 4: Min guard Ambulation Distance (Feet): 300 Feet Assistive device: Straight cane Ambulation/Gait Assistance Details: mildly, stable HP gait on the R LE.  Safe use of the cane which pt prefers over the RW. Gait Pattern: Step-through pattern (mildly HP gait) Stairs: No Wheelchair Mobility Wheelchair Mobility: No    Exercises     PT Diagnosis:    PT Problem List:   PT Treatment Interventions:     PT Goals Acute Rehab PT Goals Time For Goal Achievement: 12/14/11 Potential to Achieve Goals: Good PT Goal: Supine/Side to Sit - Progress: Progressing toward goal PT Goal: Sit to Supine/Side - Progress: Progressing toward goal PT Goal: Sit to  Stand - Progress: Progressing toward goal PT Goal: Stand to Sit - Progress: Progressing toward goal PT Transfer Goal: Bed to Chair/Chair to Bed - Progress: Progressing toward goal Pt will Ambulate: 16 - 50 feet;with rolling walker;with supervision PT Goal: Ambulate - Progress: Updated due to goal met  Visit Information  Last PT Received On: 12/10/11 Assistance Needed: +1    Subjective Data  Subjective: I don't want to hear about going to rehab, if I find I can't do something, I'll just wait for someone to come. Patient Stated Goal: To eventually return to ALF   Cognition  Overall Cognitive Status: Appears within functional limits for tasks assessed/performed (but doesn't problem solve  well for potential barriers) Arousal/Alertness: Awake/alert Orientation Level: Appears intact for tasks assessed Behavior During Session: [facility] for tasks performed    Balance  Balance Balance Assessed: No  End of Session PT - End of Session Activity Tolerance: Patient tolerated treatment well Patient left: in bed;with call bell/phone within reach;with nursing in room Nurse Communication: Mobility status   GP     Niv Darley, Eliseo Gum 12/10/2011, 6:14 PM 12/10/2011  [place] Bing, PT [phone] (pager)

## 2011-12-10 NOTE — Progress Notes (Signed)
ANTICOAGULATION CONSULT NOTE - Follow Up Consult  Pharmacy Consult for Lovenox and Coumadin Indication: atrial fibrillation  Allergies  Allergen Reactions  . Penicillins Other (See Comments)    Patient states that she was told previously by a doctor to not take this medication but is unsure why.     Patient Measurements: Height: 5\' 5"  (165.1 cm) Weight: 154 lb 12.8 oz (70.217 kg) IBW/kg (Calculated) : 57   Vital Signs: Temp: 98.5 F (36.9 C) (11/11 0530) Temp src: Oral (11/11 0530) BP: 114/69 mmHg (11/11 0530) Pulse Rate: 65  (11/11 0530)  Labs:  Basename 12/10/11 0538 12/09/11 0635 12/08/11 0500  HGB -- 10.0* --  HCT -- 30.1* --  PLT -- 181 --  APTT -- -- --  LABPROT 15.4* 14.6 15.2  INR 1.24 1.16 1.22  HEPARINUNFRC -- -- --  CREATININE -- -- --  CKTOTAL -- -- --  CKMB -- -- --  TROPONINI -- -- --    Estimated Creatinine Clearance: 54 ml/min (by C-G formula based on Cr of 0.94).   Medications:  Scheduled:    . allopurinol  100 mg Oral Daily  . amLODipine  10 mg Oral Daily  . aspirin  81 mg Oral Daily  . digoxin  125 mcg Oral Daily  . docusate sodium  100 mg Oral Daily  . donepezil  10 mg Oral Daily  . enoxaparin (LOVENOX) injection  100 mg Subcutaneous Q24H  . furosemide  20 mg Oral Daily  . insulin aspart  0-15 Units Subcutaneous TID WC  . insulin aspart  4 Units Subcutaneous TID WC  . insulin glargine  12 Units Subcutaneous QHS  . metFORMIN  500 mg Oral BID WC  . metoprolol succinate  50 mg Oral BID  . oxybutynin  10 mg Oral QHS  . oxybutynin  5 mg Oral Daily  . polyethylene glycol powder  17 g Oral Daily  . potassium chloride  10 mEq Oral Daily  . simvastatin  20 mg Oral QHS  . [COMPLETED] warfarin  7.5 mg Oral ONCE-1800  . Warfarin - Pharmacist Dosing Inpatient   Does not apply q1800    Assessment: 71 yo female with pulsatile mass L lower abdomen found to have large pseudoaneurysm of fem-fem bypass graft s/p repair on 11/7. Patient received  vitamin K 2mg  on 11/6. Pharmacy consulted to manage Lovenox and Coumadin for hx Afib. INR is subtherapeutic and slow to move after two doses of 7.5 mg. No bleeding noted, Hb trending down - will watch.  Home dose: 5 mg daily except 2.5 mg Tues, Sat  Goal of Therapy:  INR 2-3 Monitor platelets by anticoagulation protocol: Yes   Plan:  -Lovenox 100 mg SQ q24h -Coumadin 5 mg po tonight -INR daily, CBC q72h while on Lovenox  [facility], [address].D., BCPS Clinical Pharmacist Pager: [phone] 12/10/2011 9:48 AM

## 2011-12-10 NOTE — Clinical Social Work Note (Signed)
CSW gave Pt SNF bed offers. Pt was upset and stated that she does not want to go to rehab. Pt also upset because she slipped down in the recliner and could not pull herself into a more comfortable position.  CSW gently noted the concern with not going to st-snf at dc d/t to pt's current strength. Pt was waiting to work with therapist at time of CSW visit.  CSW will f/u in the am with Pt's decision. Pt has been to SNF and has 10 different facilities in the area to chose from at this time.   Frederico Hamman, LCSW [phone]

## 2011-12-11 LAB — PROTIME-INR: Prothrombin Time: 17 seconds — ABNORMAL HIGH (ref 11.6–15.2)

## 2011-12-11 LAB — GLUCOSE, CAPILLARY: Glucose-Capillary: 150 mg/dL — ABNORMAL HIGH (ref 70–99)

## 2011-12-11 MED ORDER — ENOXAPARIN SODIUM 100 MG/ML ~~LOC~~ SOLN: 100.0000 mg | SUBCUTANEOUS | Status: DC

## 2011-12-11 MED ORDER — OXYCODONE HCL 5 MG PO TABS: 5.0000 mg | ORAL_TABLET | Freq: Four times a day (QID) | ORAL | Status: DC | PRN

## 2011-12-11 NOTE — Discharge Summary (Addendum)
[facility] Discharge Summary   Patient ID:  Casey [name] MRN: [id number] DOB/AGE: Aug 18, 1940 71 y.o.  Admit date: 12/04/2011 Discharge date: 12/11/2011 Date of Surgery: 12/04/2011 - 12/06/2011 Surgeon: Moishe Spice): Chuck Hint, MD  Admission Diagnosis: Hypercholesterolemia [272.0] Pseudoaneurysm [442.9] from dr off Pseudoaneurysm of left side femoral-femoral bypass   Discharge Diagnoses:  Hypercholesterolemia [272.0] Pseudoaneurysm [442.9] from dr off Pseudoaneurysm of left side femoral-femoral bypass   Secondary Diagnoses: Past Medical History  Diagnosis Date  . Chronic atrial fibrillation   . Stroke   . Hypertension   . Hypercholesterolemia   . Diabetes mellitus   . Sinus of Valsalva aneurysm     a. By 2D echo 05/2011.  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  . CHF (congestive heart failure)   . GERD (gastroesophageal reflux disease)     Procedure(s): FEMORAL ARTERY EXPLORATION BYPASS GRAFT FEMORAL-FEMORAL ARTERY REPAIR FALSE ANEURYSM  Discharged Condition: good  HPI:  Casey [name] is a 71 y.o. female who underwent a right to left fem-fem bypass graft by Dr. Hart Rochester in 2010. I believe the patient had a previous left femoropopliteal bypass graft with vein. She was last seen in follow up by Dr. Hart Rochester in September of 2012. She was to return in 6 months but apparently did not return at that time. She had a routine visit with Dr. Patty Sermons today. She noted that she had been having some lower abdominal discomfort and on exam was found a to have a large pulsatile mass in the left lower quadrant. She was sent to the emergency department.  She is a very poor historian. She believes that she has had this mass for approximately one month. It has been gradually enlarging. He denies fever or chills. She does not remember any  sudden acute onset of symptoms. She is on chronic Coumadin therapy for atrial fibrillation. She denies rest pain or nonhealing ulcers.    Hospital Course:  Casey [name] is a 71 y.o. female is S/P Both Procedure(s): FEMORAL ARTERY EXPLORATION BYPASS GRAFT FEMORAL-FEMORAL ARTERY REPAIR FALSE ANEURYSM Extubated: POD # 0 Post-op wounds healing well Pt. Ambulating, voiding and taking PO diet without difficulty. Pt pain controlled with PO pain meds. Labs as below Complications:none  Consults:  Treatment Team:  Chuck Hint, MD [facility], MD  Significant Diagnostic Studies: CBC Lab Results  Component Value Date   WBC 8.3 12/09/2011   HGB 10.0* 12/09/2011   HCT 30.1* 12/09/2011   MCV 82.9 12/09/2011   PLT 181 12/09/2011    BMET    Component Value Date/Time   NA 140 12/07/2011 0442   K 3.8 12/07/2011 0442   CL 106 12/07/2011 0442   CO2 25 12/07/2011 0442   GLUCOSE 138* 12/07/2011 0442   BUN 10 12/07/2011 0442   CREATININE 0.94 12/07/2011 0442   CALCIUM 8.9 12/07/2011 0442   GFRNONAA 60* 12/07/2011 0442   GFRAA 69* 12/07/2011 0442   COAG Lab Results  Component Value Date   INR 1.42 12/11/2011   INR 1.24 12/10/2011   INR 1.16 12/09/2011     Disposition:  Discharge to :  Skilled Nursing Facility Discharge Orders    Future Appointments: Provider: Department: Dept Phone: Center:   12/17/2011 1:00 PM [facility] [phone] None   12/19/2011 4:00 PM Chuck Hint, MD [facility]  [phone] [facility]     Future Orders Please Complete By Expires   Resume previous diet      Driving Restrictions      Comments:   No driving for 2 weeks   Lifting restrictions      Comments:   No lifting for 6 weeks   Call MD for:  temperature >100.5      Call MD for:  redness, tenderness, or signs of infection (pain, swelling, bleeding, redness, odor or green/yellow discharge around  incision site)      Call MD for:  severe or increased pain, loss or decreased feeling  in affected limb(s)      Discharge wound care:      Comments:   Shower daily with soap and water starting 12/10/11   Nursing communication      Scheduling Instructions:   Please give paper Rx to patient at discharge.      Shanyla, Marconi  Home Medication Instructions [id number]   Printed on:12/11/11 0757  Medication Information                    digoxin (LANOXIN) 0.125 MG tablet Take 125 mcg by mouth daily.             furosemide (LASIX) 20 MG tablet Take 20 mg by mouth daily.             potassium chloride (K-DUR,KLOR-CON) 10 MEQ tablet Take 10 mEq by mouth daily.             amLODipine (NORVASC) 5 MG tablet Take 5 mg by mouth daily.             allopurinol (ZYLOPRIM) 100 MG tablet Take 100 mg by mouth daily.             aspirin 81 MG tablet Take 1 tablet (81 mg total) by mouth daily.           metoprolol succinate (TOPROL-XL) 50 MG 24 hr tablet Take 50 mg by mouth 2 (two) times daily. Take with or immediately following a meal.           donepezil (ARICEPT) 10 MG tablet Take 10 mg by mouth daily.           traMADol (ULTRAM) 50 MG tablet Take 50 mg by mouth 3 (three) times daily as needed. For pain           oxybutynin (DITROPAN) 5 MG tablet Take 5-10 mg by mouth 2 (two) times daily. Take 1 tab in the am and 2 tabs at night           insulin aspart (NOVOLOG) 100 UNIT/ML injection Inject 4 Units into the skin 3 (three) times daily with meals.           insulin glargine (LANTUS) 100 UNIT/ML injection Inject 12 Units into the skin at bedtime.           metFORMIN (GLUCOPHAGE) 500 MG tablet Take 1 tablet (500 mg total) by mouth 2 (two) times daily with a meal.           simvastatin (ZOCOR) 20 MG tablet Take 1 tablet (20 mg total) by mouth at bedtime.           polyethylene glycol powder (GLYCOLAX/MIRALAX) powder Take 17 g by mouth daily.            warfarin (COUMADIN) 5  MG tablet Take 2.5-5 mg by mouth daily. 1 tablet daily except 1/2 tablet on  Tuesdays and Saturdays.           enoxaparin (LOVENOX) 100 MG/ML injection Inject 1 mL (100 mg total) into the skin daily.           oxyCODONE (ROXICODONE) 5 MG immediate release tablet Take 1 tablet (5 mg total) by mouth every 6 (six) hours as needed for pain.            Verbal and written Discharge instructions given to the patient. Wound care per Discharge AVS Follow-up Information    Follow up with Eldine Rencher S, MD. In 2 weeks. (Office will call you to arrange your appt (sent))    Contact information:   [address] [place] [address] [phone]          Signed: Clinton Gallant [facility] 12/11/2011, 7:57 AM  Agree with plans for D/C to SNF today.  Cari Caraway Beeper [phone] 12/11/2011

## 2011-12-11 NOTE — Discharge Summary (Signed)
Agree with above  Cari Caraway Beeper [phone] 12/11/2011

## 2011-12-11 NOTE — Progress Notes (Signed)
Called by monitor tech for 7 beats of V-Tach. Pt was resting on the bed. Asymptomatic. Strip is in the chart. Will continue to monitor.

## 2011-12-11 NOTE — Progress Notes (Signed)
VASCULAR PROGRESS NOTE  SUBJECTIVE: She tells me that she did well ambulating yesterday. However, PT still recommending SNF?  PHYSICAL EXAM: Filed Vitals:   12/10/11 1540 12/10/11 2107 12/11/11 0319 12/11/11 0456  BP: 114/53 134/72  117/67  Pulse: 62 73  68  Temp: 98.2 F (36.8 C) 98.4 F (36.9 C)  97.9 F (36.6 C)  TempSrc: Oral Oral  Oral  Resp: 18 18  18   Height:      Weight:   153 lb 11.2 oz (69.718 kg)   SpO2: 99% 100%  97%   Incisions look fine Feet warm  LABS: Lab Results  Component Value Date   WBC 8.3 12/09/2011   HGB 10.0* 12/09/2011   HCT 30.1* 12/09/2011   MCV 82.9 12/09/2011   PLT 181 12/09/2011   Lab Results  Component Value Date   CREATININE 0.94 12/07/2011   Lab Results  Component Value Date   INR 1.42 12/11/2011   CBG (last 3)   Basename 12/10/11 2103 12/10/11 1641 12/10/11 1126  GLUCAP 80 142* 104*     ASSESSMENT/PLAN: 1. 5 Days Post-Op s/p: Repair of pseudoaneurysm of fem fem graft. 2. SNF vs home with HHPT. Will discuss with PT. 3. Lovenox bridge until coumadin therapeutic.  Cari Caraway Beeper: [phone] 12/11/2011

## 2011-12-18 ENCOUNTER — Encounter: Payer: Self-pay | Admitting: Vascular Surgery

## 2011-12-19 ENCOUNTER — Ambulatory Visit: Payer: Medicare Other | Admitting: Vascular Surgery

## 2012-01-01 ENCOUNTER — Encounter: Payer: Self-pay | Admitting: Vascular Surgery

## 2012-01-02 ENCOUNTER — Ambulatory Visit: Payer: Medicare Other | Admitting: Vascular Surgery

## 2012-02-19 ENCOUNTER — Encounter: Payer: Self-pay | Admitting: Vascular Surgery

## 2012-02-20 ENCOUNTER — Other Ambulatory Visit: Payer: Self-pay | Admitting: *Deleted

## 2012-02-20 ENCOUNTER — Encounter: Payer: Self-pay | Admitting: Vascular Surgery

## 2012-02-20 ENCOUNTER — Ambulatory Visit (INDEPENDENT_AMBULATORY_CARE_PROVIDER_SITE_OTHER): Payer: Medicare Other | Admitting: Vascular Surgery

## 2012-02-20 VITALS — BP 137/55 | HR 117 | Ht 65.0 in | Wt 161.6 lb

## 2012-02-20 DIAGNOSIS — I739 Peripheral vascular disease, unspecified: Secondary | ICD-10-CM

## 2012-02-20 DIAGNOSIS — Z48812 Encounter for surgical aftercare following surgery on the circulatory system: Secondary | ICD-10-CM

## 2012-02-20 DIAGNOSIS — I70219 Atherosclerosis of native arteries of extremities with intermittent claudication, unspecified extremity: Secondary | ICD-10-CM | POA: Insufficient documentation

## 2012-02-20 NOTE — Progress Notes (Signed)
[facility] [place]  Patient name: Casey [name] MRN: [id number] DOB: 06-23-1940 Sex: female  REASON FOR VISIT: follow up after repair of pseudoaneurysm left groin  HPI: Casey [name] is a 72 y.o. female who had a previous left femoropopliteal bypass graft and most recently a right to left fem-fem bypass graft by Dr. Hart Rochester in 2010. She developed a large pseudoaneurysm in her left groin. I explored this on 12/06/2011 repair the pseudoaneurysm of the fem-fem graft. The Dacron patch on the left common femoral artery had separated as had the anastomosis of the fem-fem graft on the left. Amazingly the femoropopliteal bypass graft on the left was patent. I was able to reimplant the left limb of the fem-fem graft and incompetence the left external iliac artery and the origin of the left femoropopliteal bypass graft. The superficial femoral artery and deep femoral arteries on the left were occluded. He comes in for a follow up visit. She has no specific complaints. She's been ambulating without difficulty.   REVIEW OF SYSTEMS: Arly.Keller ] denotes positive finding; [  ] denotes negative finding  CARDIOVASCULAR:  [ ]  chest pain   [ ]  dyspnea on exertion    CONSTITUTIONAL:  [ ]  fever   [ ]  chills  PHYSICAL EXAM: Filed Vitals:   02/20/12 1138  BP: 137/55  Pulse: 117  Height: 5\' 5"  (1.651 m)  Weight: 161 lb 9.6 oz (73.301 kg)  SpO2: 99%   Body mass index is 26.89 kg/(m^2). GENERAL: The patient is a well-nourished female, in no acute distress. The vital signs are documented above. CARDIOVASCULAR: There is a regular rate and rhythm  PULMONARY: There is good air exchange bilaterally without wheezing or rales. He has palpable femoral pulses. Both feet are warm and well-perfused. Surgeon in the left groin is healed nicely.  MEDICAL ISSUES: The patient is doing well status post repair of left femoral artery pseudoaneurysm. We'll get ABIs in graft duplex in 6 months. She knows to  call sooner if she has problems.  DICKSON,CHRISTOPHER S [facility] [place] Beeper: [phone]

## 2012-04-24 ENCOUNTER — Other Ambulatory Visit: Payer: Self-pay | Admitting: *Deleted

## 2012-04-24 DIAGNOSIS — I10 Essential (primary) hypertension: Secondary | ICD-10-CM

## 2012-04-24 DIAGNOSIS — IMO0001 Reserved for inherently not codable concepts without codable children: Secondary | ICD-10-CM

## 2012-04-24 DIAGNOSIS — I4891 Unspecified atrial fibrillation: Secondary | ICD-10-CM

## 2012-04-25 ENCOUNTER — Encounter: Payer: Self-pay | Admitting: Pharmacist

## 2012-04-28 ENCOUNTER — Ambulatory Visit (INDEPENDENT_AMBULATORY_CARE_PROVIDER_SITE_OTHER): Payer: Medicare Other | Admitting: Pharmacotherapy

## 2012-04-28 ENCOUNTER — Encounter: Payer: Self-pay | Admitting: Pharmacotherapy

## 2012-04-28 VITALS — BP 130/58 | HR 74 | Temp 96.5°F | Resp 18 | Wt 157.2 lb

## 2012-04-28 DIAGNOSIS — E785 Hyperlipidemia, unspecified: Secondary | ICD-10-CM

## 2012-04-28 DIAGNOSIS — E119 Type 2 diabetes mellitus without complications: Secondary | ICD-10-CM

## 2012-04-28 DIAGNOSIS — I10 Essential (primary) hypertension: Secondary | ICD-10-CM

## 2012-04-28 NOTE — Progress Notes (Signed)
  Subjective:    Casey [name] is a 72 y.o. female who presents for follow-up of Type 2 diabetes mellitus.    Current diabetic medications include Lantus 12 units daily and Metformin. Eye exam current (within one year): no vision problems  Trying to eat healthy.  Supplementing with Boost No routine exercise. Denies problems with feet.  Sees a podiatrist regularly.  She did not bring blood glucose meter to visit today Home blood sugar records: 70-172 Any episodes of hypoglycemia? No - lowest BG was a 68  No nocturia    Review of Systems A comprehensive review of systems was negative.    Objective:    BP 130/58  Pulse 74  Temp(Src) 96.5 F (35.8 C)  Resp 18  Wt 157 lb 3.2 oz (71.305 kg)  BMI 26.16 kg/m2  General:  alert, cooperative and appears stated age  Oropharynx: lips, mucosa, and tongue normal; teeth and gums normal   Eyes:  conjunctivae/corneas clear. PERRL, EOM's intact. Fundi benign.           Lung: clear to auscultation bilaterally  Heart:  regular rate and rhythm, S1, S2 normal, no murmur, click, rub or gallop     Extremities: extremities normal, atraumatic, no cyanosis or edema  Skin: warm and dry, no hyperpigmentation, vitiligo, or suspicious lesions         Lab Review Glucose, Bld (mg/dL)  Date Value  12/07/2011 138*  12/06/2011 102*  12/05/2011 167*     CO2 (mEq/L)  Date Value  12/07/2011 25   12/06/2011 25   12/05/2011 25      BUN (mg/dL)  Date Value  12/07/2011 10   12/06/2011 15   12/05/2011 18      Creatinine, Ser (mg/dL)  Date Value  12/07/2011 0.94   12/06/2011 1.00   12/05/2011 0.99    Will do labs today:  A1C, CMP, microalbumin  Assessment:    Diabetes Mellitus type II, under fair control.  HTN - BP well controlled.   Plan:    1.  Rx changes: none  Will wait to see lab results prior to making any changes.  She is reporting good numbers.  Continue Lantus 12 units daily and Metformin 2.  Counseled on nutrition goals. 3.   Counseled on exercise goals.  She is to exercise as able.  She has a new pair of diabetic shoes that has not been picked up yet. 4.  BP at goal <140/80  5. Follow up: 3 months - lab prior:  A1C , CMP, FLP

## 2012-04-29 LAB — HEMOGLOBIN A1C
Est. average glucose Bld gHb Est-mCnc: 148 mg/dL
Hgb A1c MFr Bld: 6.8 % — ABNORMAL HIGH (ref 4.8–5.6)

## 2012-04-29 LAB — COMPREHENSIVE METABOLIC PANEL
ALT: 9 IU/L (ref 0–32)
AST: 15 IU/L (ref 0–40)
Albumin/Globulin Ratio: 1.3 (ref 1.1–2.5)
Albumin: 4.3 g/dL (ref 3.5–4.8)
Alkaline Phosphatase: 86 IU/L (ref 39–117)
BUN/Creatinine Ratio: 19 (ref 11–26)
BUN: 22 mg/dL (ref 8–27)
CO2: 25 mmol/L (ref 19–28)
Calcium: 10.6 mg/dL — ABNORMAL HIGH (ref 8.6–10.2)
Chloride: 104 mmol/L (ref 97–108)
Creatinine, Ser: 1.16 mg/dL — ABNORMAL HIGH (ref 0.57–1.00)
GFR calc Af Amer: 55 mL/min/{1.73_m2} — ABNORMAL LOW (ref >59)
GFR calc non Af Amer: 47 mL/min/{1.73_m2} — ABNORMAL LOW (ref >59)
Globulin, Total: 3.3 g/dL (ref 1.5–4.5)
Glucose: 99 mg/dL (ref 65–99)
Potassium: 6.3 mmol/L — ABNORMAL HIGH (ref 3.5–5.2)
Sodium: 142 mmol/L (ref 134–144)
Total Bilirubin: 0.4 mg/dL (ref 0.0–1.2)
Total Protein: 7.6 g/dL (ref 6.0–8.5)

## 2012-04-29 LAB — MICROALBUMIN / CREATININE URINE RATIO
Creatinine, Ur: 43.7 mg/dL (ref 15.0–278.0)
MICROALB/CREAT RATIO: 12.1 mg/g creat (ref 0.0–30.0)
Microalbumin, Urine: 5.3 ug/mL (ref 0.0–17.0)

## 2012-05-02 ENCOUNTER — Other Ambulatory Visit: Payer: Medicare Other

## 2012-05-04 ENCOUNTER — Telehealth: Payer: Self-pay | Admitting: Pharmacotherapy

## 2012-05-06 ENCOUNTER — Telehealth: Payer: Self-pay | Admitting: Cardiology

## 2012-05-06 ENCOUNTER — Ambulatory Visit (INDEPENDENT_AMBULATORY_CARE_PROVIDER_SITE_OTHER): Payer: Medicare Other | Admitting: Internal Medicine

## 2012-05-06 ENCOUNTER — Encounter: Payer: Self-pay | Admitting: Internal Medicine

## 2012-05-06 ENCOUNTER — Telehealth: Payer: Self-pay | Admitting: Internal Medicine

## 2012-05-06 VITALS — BP 122/60 | HR 59 | Temp 97.7°F | Resp 18 | Wt 155.0 lb

## 2012-05-06 DIAGNOSIS — F0390 Unspecified dementia without behavioral disturbance: Secondary | ICD-10-CM | POA: Insufficient documentation

## 2012-05-06 DIAGNOSIS — E1329 Other specified diabetes mellitus with other diabetic kidney complication: Secondary | ICD-10-CM

## 2012-05-06 DIAGNOSIS — E0822 Diabetes mellitus due to underlying condition with diabetic chronic kidney disease: Secondary | ICD-10-CM

## 2012-05-06 DIAGNOSIS — F0280 Dementia in other diseases classified elsewhere without behavioral disturbance: Secondary | ICD-10-CM

## 2012-05-06 DIAGNOSIS — M109 Gout, unspecified: Secondary | ICD-10-CM | POA: Insufficient documentation

## 2012-05-06 DIAGNOSIS — I482 Chronic atrial fibrillation, unspecified: Secondary | ICD-10-CM

## 2012-05-06 DIAGNOSIS — I10 Essential (primary) hypertension: Secondary | ICD-10-CM

## 2012-05-06 DIAGNOSIS — N058 Unspecified nephritic syndrome with other morphologic changes: Secondary | ICD-10-CM

## 2012-05-06 DIAGNOSIS — I4891 Unspecified atrial fibrillation: Secondary | ICD-10-CM

## 2012-05-06 DIAGNOSIS — E875 Hyperkalemia: Secondary | ICD-10-CM | POA: Insufficient documentation

## 2012-05-06 NOTE — Assessment & Plan Note (Signed)
bp remains stable. Will continue amlodipine 5 mg daily for now with furosemide . On kcl supplement. Will discontinue this for now. Monitor bmp- check today

## 2012-05-06 NOTE — Assessment & Plan Note (Signed)
Recent a1c 6.8, improved from last visit. No hypoglycemia. Continue current regimen of lantus and metformin. Monitor renal function. Continue statin. With impaired renal function will recheck bmp prior to next visit and consider starting ACEI/ARB

## 2012-05-06 NOTE — Assessment & Plan Note (Signed)
Under control with current regimen of allopurinol, no recent flare up- continue current regimen

## 2012-05-06 NOTE — Assessment & Plan Note (Signed)
Rate under control with toprol xl and digoxin. Not on ASA or coumadin. Unclear about reason for stopping with hx of CVA, aneurysm and a.fib. Pt not sure if she is taking the medication. Will call her home health nurse and verify further.

## 2012-05-06 NOTE — Assessment & Plan Note (Signed)
Will stop kcl. Recheck bmp today.currently asymptomatic

## 2012-05-06 NOTE — Telephone Encounter (Signed)
Attempted to return Casey [name]'s call.  After holding for 5 minutes I was disconnected.   Called back a few minutes later and spoke with [facility] who needed information about patient's appointments.  Patient was last seen in [facility] 10/13.  Patient was supposed to follow up 6 weeks later and was a no show for her appointment.  Patient again did not show up for 4/4 appointment.

## 2012-05-06 NOTE — Progress Notes (Signed)
Patient ID: Casey [name], female   DOB: January 07, 1941, 72 y.o.   MRN: [id number]  On further review, pt was lost to follow up with home nursing and not going for inr check to cardiology office either. She has stopped taking coumadin. Being a high risk pt. Have spoken with her son (Print production planner spoke with him) and an appointment has been scheduled for tomorrow for inr check and resuming of coumadin.

## 2012-05-06 NOTE — Telephone Encounter (Signed)
New Prob   Has some questions regarding coumadin medication for pt. States it is an emergency and would like to speak to someone.

## 2012-05-06 NOTE — Assessment & Plan Note (Signed)
Mix of alzhimer and vascular dementia. Will continue aricept for now. Discussed with pt about being in assisted living but pt wants to live by herself and assistance being provided by her daughter. [facility] and they do not provide pre-packaged medication which can be a problem from hereon given her being on multiple meds and not knowing the medications and then her memory issue. Will provide home health referral for nursing to help with medication management.

## 2012-05-06 NOTE — Progress Notes (Signed)
Subjective:    Patient ID: Casey [name], female    DOB: Sep 27, 1940, 72 y.o.   MRN: [id number]  CC- routine follow up, hyperkalemia  HPI Pt with hx of dm, CVA, HTN and hyperlipidemia among others is here for her routine follow up. She has dementia but continues to live alone. She administers her medication by herself. Her daughter in law helps with her insulin and her medications are prepackaged and sent by her pharmacy/ she has no complaints this visit. She does not have her glucose monitor with her. She mentions sugar to be normal at home between 88-150. No falls reported    Review of Systems  Constitutional: Negative for fever, chills, activity change and appetite change.  HENT: Negative for congestion.   Respiratory: Negative for shortness of breath.   Cardiovascular: Negative for chest pain, palpitations and leg swelling.  Gastrointestinal: Negative for abdominal pain and abdominal distention.  Genitourinary: Negative.   Musculoskeletal: Negative for back pain and arthralgias.  Neurological: Negative for dizziness, light-headedness and headaches.  Hematological: Negative for adenopathy.  Psychiatric/Behavioral: Negative for agitation.       Objective:   Physical Exam  Constitutional: She appears well-developed and well-nourished. No distress.  HENT:  Head: Normocephalic and atraumatic.  Mouth/Throat: No oropharyngeal exudate.  Eyes: Conjunctivae and EOM are normal. Pupils are equal, round, and reactive to light.  Neck: Normal range of motion. Neck supple.  Cardiovascular: Normal rate and normal heart sounds.   Pulmonary/Chest: Effort normal and breath sounds normal.  Abdominal: Soft. Bowel sounds are normal.  Musculoskeletal: Normal range of motion.  Weakness in UE and LE on right side, sensation intact  Neurological: She is alert.  Oriented to person and place  Skin: Skin is warm and dry. She is not diaphoretic. No erythema.  Psychiatric: She has a normal mood and  affect.  using a cane  BP 122/60  Pulse 59  Temp(Src) 97.7 F (36.5 C) (Oral)  Resp 18  Wt 155 lb (70.308 kg)  BMI 25.79 kg/m2  SpO2 98%  LABS REVIEWED- Component     Latest Ref Rng 04/28/2012  Chloride     97 - 108 mmol/L 104   Lab Results  Component Value Date   HGBA1C 6.8* 04/28/2012   Lab Results  Component Value Date   HGBA1C 6.8* 04/28/2012   HGBA1C 8.3* 12/05/2011   HGBA1C 13.2* 06/19/2011   Lab Results  Component Value Date   LDLCALC  Value: 126        Total Cholesterol/HDL:CHD Risk Coronary Heart Disease Risk Table                     Men   Women  1/2 Average Risk   3.4   3.3  Average Risk       5.0   4.4  2 X Average Risk   9.6   7.1  3 X Average Risk  23.4   11.0        Use the calculated Patient Ratio above and the CHD Risk Table to determine the patient's CHD Risk.        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 No hypoglycemia. Continue current regimen of lantus and metformin. Monitor renal function. Continue statin. With impaired renal function will recheck bmp prior to next visit and consider starting ACEI/ARB  Hypertension bp remains stable. Will continue amlodipine 5 mg daily for now with furosemide . On kcl supplement. Will discontinue this for now. Monitor bmp- check today  Gout Under control with current regimen of allopurinol, no recent flare up- continue current regimen  Alzheimer's disease Mix of alzhimer and vascular dementia. Will continue aricept for now. Discussed with pt about being in assisted living but pt wants to live by herself and assistance being provided by her daughter. [facility]  [facility] and they do not provide pre-packaged medication which can be a problem from hereon given her being on multiple meds and not knowing the medications and then her memory issue. Will provide home health referral for nursing to help with medication management.  Chronic atrial fibrillation Rate under control with toprol xl and digoxin. Not on ASA or coumadin. Unclear about reason for stopping with hx of CVA, aneurysm and a.fib. Pt not sure if she is taking the medication. Will call her home health nurse and verify further.  Hyperkalemia Will stop kcl. Recheck bmp today.currently asymptomatic  discussed with patient again about it not being a safe plan for her to be at home by herself. Pt insists to live alone and have her daughter in law help her when needed.

## 2012-05-06 NOTE — Telephone Encounter (Signed)
I spoke w/ pts son on today, says he would get off work early on tomorrow and bring his mother into the office. Discussed w/ Casimiro Needle the urgency of this visit, told Casimiro Needle his mother should not be living along per Dr. Glade Lloyd. Mrs. Ose home health ins has ran out therefore, home health would not be coming to her home to make sure she has all of her medication.  cdavis

## 2012-05-07 LAB — BASIC METABOLIC PANEL
BUN/Creatinine Ratio: 19 (ref 11–26)
BUN: 24 mg/dL (ref 8–27)
CO2: 25 mmol/L (ref 19–28)
Chloride: 104 mmol/L (ref 97–108)
Sodium: 140 mmol/L (ref 134–144)

## 2012-05-08 ENCOUNTER — Telehealth: Payer: Self-pay | Admitting: Internal Medicine

## 2012-05-08 NOTE — Telephone Encounter (Addendum)
Called Mrs Riccobono's home.  No ans. Or ans machine to leave a message.   Jamesetta Orleans, Pts son.  Says Mrs. Evers (his mother) refused to come to the office on Wedneday,, May 07, 2012.  Says he will be moving back to [place] on this weekend.  Says he will able to assist his mother with her medication and office visit.  Asked if Casimiro Needle would give [place] a call on next.  Says he would do so.  fyi to Dr. Glade Lloyd.  05-08-2012 cdavis

## 2012-05-08 NOTE — Telephone Encounter (Signed)
Will need patient's INR checked. Can we arrange for homehealth to check the inr and then start her on coumadin or if son can bring her to office for INR chec- whichever is sooner will be good. thanks

## 2012-05-12 ENCOUNTER — Telehealth: Payer: Self-pay | Admitting: *Deleted

## 2012-05-12 ENCOUNTER — Ambulatory Visit (INDEPENDENT_AMBULATORY_CARE_PROVIDER_SITE_OTHER): Payer: Medicare Other | Admitting: Internal Medicine

## 2012-05-12 DIAGNOSIS — I4891 Unspecified atrial fibrillation: Secondary | ICD-10-CM

## 2012-05-12 LAB — PROTIME-INR
INR: 2.3 — AB (ref 0.9–1.1)
INR: 2.3 — AB (ref 0.9–1.1)

## 2012-05-12 LAB — POCT INR: INR: 2.3

## 2012-05-12 NOTE — Progress Notes (Signed)
Checked patient's coumadin today and was 2.3. Patient currently taking Warfarin 5mg  on Sunday, Monday,Wednesday, Thursday and Friday and 2.5mg  on Tuesday and Saturday. Per Dr. Renato Gails patient is to continue current dose and recheck in 1 month. Patient and Son agreed.

## 2012-05-12 NOTE — Progress Notes (Signed)
Patient states she hasn't taken any medications for 2 days. Medication was out of medication bottles in bag. Son is going to take them to the pharmacy and have them put in pill box correctly. Son called pharmacy in the room and is taking them after leaving here.

## 2012-05-12 NOTE — Telephone Encounter (Signed)
Casey [name] called and stated that Casey [name] has declined services over the week end They are suppose to go back out and see her'

## 2012-05-19 ENCOUNTER — Encounter: Payer: Self-pay | Admitting: *Deleted

## 2012-05-21 ENCOUNTER — Telehealth: Payer: Self-pay | Admitting: *Deleted

## 2012-05-21 NOTE — Telephone Encounter (Signed)
Patient dropped off paperwork to be filled out for Diabetic shoes and inserts. Dr. Rozetta Nunnery with [facility] wrote a Rx for 1 pair extra depth diabetic shoes and 3 pairs plastizole inserts. And he needed a Statement of Certifying Physician Form filled out by Dr. Glade Lloyd. Dr. Glade Lloyd signed and filled out form and I mailed to Patient's Address:  [address] [place] [address] Patient son, Casey [name], Notified.

## 2012-05-27 ENCOUNTER — Telehealth: Payer: Self-pay | Admitting: *Deleted

## 2012-05-27 DIAGNOSIS — I129 Hypertensive chronic kidney disease with stage 1 through stage 4 chronic kidney disease, or unspecified chronic kidney disease: Secondary | ICD-10-CM

## 2012-05-27 DIAGNOSIS — I4891 Unspecified atrial fibrillation: Secondary | ICD-10-CM

## 2012-05-27 DIAGNOSIS — N189 Chronic kidney disease, unspecified: Secondary | ICD-10-CM

## 2012-05-27 NOTE — Telephone Encounter (Signed)
[facility] will be getting patient  1.Physical therapy 2.Social Woker 3.Nursing   Casey [name] will still take care of patient INR results.

## 2012-05-28 NOTE — Telephone Encounter (Signed)
thanks

## 2012-05-29 NOTE — Addendum Note (Signed)
Addended by: Kermit Balo on: 05/29/2012 08:57 AM   Modules accepted: Level of Service

## 2012-06-04 ENCOUNTER — Telehealth: Payer: Self-pay | Admitting: *Deleted

## 2012-06-04 NOTE — Telephone Encounter (Signed)
Son states that [employer] needs the Progress Note to state that patient needs Diabetic Shoes and Inserts due to diabetes. Can you please make an addendum to the Progress Note.

## 2012-06-04 NOTE — Telephone Encounter (Signed)
i had not done a detailed foot exam on her last routine visit as there were multiple chronic problems to address. pls have an appointment schedule with son being present for diabetic shoe evalluation and also have a form brought in.

## 2012-06-05 NOTE — Telephone Encounter (Signed)
LMOM to return call.

## 2012-06-05 NOTE — Telephone Encounter (Signed)
Talked with son and scheduled an appointment for 06/10/2012 @ 9:45 with Dr. Glade Lloyd. Son agreed.

## 2012-06-06 ENCOUNTER — Encounter: Payer: Self-pay | Admitting: *Deleted

## 2012-06-09 ENCOUNTER — Ambulatory Visit (INDEPENDENT_AMBULATORY_CARE_PROVIDER_SITE_OTHER): Payer: Medicare Other | Admitting: Cardiology

## 2012-06-09 ENCOUNTER — Encounter: Payer: Self-pay | Admitting: Pharmacotherapy

## 2012-06-09 ENCOUNTER — Ambulatory Visit (INDEPENDENT_AMBULATORY_CARE_PROVIDER_SITE_OTHER): Payer: Medicare Other | Admitting: Pharmacotherapy

## 2012-06-09 ENCOUNTER — Encounter: Payer: Self-pay | Admitting: Cardiology

## 2012-06-09 ENCOUNTER — Encounter: Payer: Self-pay | Admitting: Geriatric Medicine

## 2012-06-09 VITALS — BP 132/62 | HR 70 | Temp 94.5°F | Resp 16 | Ht 65.0 in | Wt 156.8 lb

## 2012-06-09 VITALS — BP 128/74 | HR 64 | Ht 65.0 in | Wt 153.0 lb

## 2012-06-09 DIAGNOSIS — R32 Unspecified urinary incontinence: Secondary | ICD-10-CM

## 2012-06-09 DIAGNOSIS — IMO0001 Reserved for inherently not codable concepts without codable children: Secondary | ICD-10-CM

## 2012-06-09 DIAGNOSIS — I4891 Unspecified atrial fibrillation: Secondary | ICD-10-CM

## 2012-06-09 DIAGNOSIS — Z7901 Long term (current) use of anticoagulants: Secondary | ICD-10-CM

## 2012-06-09 DIAGNOSIS — I6350 Cerebral infarction due to unspecified occlusion or stenosis of unspecified cerebral artery: Secondary | ICD-10-CM

## 2012-06-09 DIAGNOSIS — E785 Hyperlipidemia, unspecified: Secondary | ICD-10-CM

## 2012-06-09 DIAGNOSIS — G894 Chronic pain syndrome: Secondary | ICD-10-CM

## 2012-06-09 DIAGNOSIS — I10 Essential (primary) hypertension: Secondary | ICD-10-CM

## 2012-06-09 DIAGNOSIS — R413 Other amnesia: Secondary | ICD-10-CM

## 2012-06-09 DIAGNOSIS — I639 Cerebral infarction, unspecified: Secondary | ICD-10-CM

## 2012-06-09 DIAGNOSIS — M109 Gout, unspecified: Secondary | ICD-10-CM

## 2012-06-09 DIAGNOSIS — R569 Unspecified convulsions: Secondary | ICD-10-CM

## 2012-06-09 MED ORDER — WARFARIN SODIUM 5 MG PO TABS: 5.0000 mg | ORAL_TABLET | Freq: Every day | ORAL | Status: DC

## 2012-06-09 NOTE — Assessment & Plan Note (Signed)
The patient has not been having any hypoglycemic episodes 

## 2012-06-09 NOTE — Patient Instructions (Signed)
Increase Coumadin 5mg  daily

## 2012-06-09 NOTE — Assessment & Plan Note (Signed)
Blood pressure was remaining stable on current therapy 

## 2012-06-09 NOTE — Patient Instructions (Addendum)
Your physician recommends that you continue on your current medications as directed. Please refer to the Current Medication list given to you today.  Your physician recommends that you schedule a follow-up appointment in: 4 MONTH OV 

## 2012-06-09 NOTE — Assessment & Plan Note (Signed)
The patient has not had any recurrent TIA or stroke symptoms and overall she appears to be stronger than she was last visit.

## 2012-06-09 NOTE — Progress Notes (Signed)
Subjective:     Patient ID: Casey [name], female   DOB: 1940/12/07, 72 y.o.   MRN: [id number]  HPI Goal INR 2-3 Denies missed doses. Son manages all medications.  He is not with her today. Denies unusual bleeding or bruising. Denies CP, palpitations, falls, Consistent with vitamin K intake   Review of Systems  HENT: Negative for nosebleeds.   Respiratory: Negative for chest tightness and shortness of breath.   Cardiovascular: Negative for chest pain and palpitations.  Gastrointestinal: Negative for blood in stool and anal bleeding.  Genitourinary: Negative for hematuria.       Objective:   Physical Exam  Constitutional: She is oriented to person, place, and time. She appears well-developed and well-nourished.  Eyes: Conjunctivae are normal. Pupils are equal, round, and reactive to light.  Neck: Normal range of motion.  Cardiovascular: An irregular rhythm present.  Musculoskeletal:  Ambulates with cane, fall risk  Neurological: She is alert and oriented to person, place, and time.  Skin: Skin is warm and dry.  Psychiatric: She has a normal mood and affect. Her behavior is normal.    BP:  122/62    HR:  70    Wt:  156lb      Temp:  94.5     Assessment:     INR 1.6     Plan:     1.  INR below goal 2-3 2.  AF - stable 3.  CVA - stable 4.  Increase Coumadin 5mg  QD 5.  RTC 3 weeks

## 2012-06-09 NOTE — Progress Notes (Signed)
Casey [name] Date of Birth:  [birth year] [facility] [address] [place], [place]  [address] [phone]         Fax   [phone]  History of Present Illness: This pleasant elderly African American woman is seen for a scheduled followup office visit.  She has a history of atrial fibrillation and prior embolic strokes. She has a long history of high blood pressure. She has diabetes and dyslipidemia. She now has a new primary care provider at [facility] care who is helping her with her insulin.  She states that her heart has been feeling better. She has not been having any chest pain or shortness of breath. She apparently was hospitalized in may 2013 with poorly controlled diabetes.  Her prothrombin times have been followed at [facility]   Current Outpatient Prescriptions  Medication Sig Dispense Refill  . allopurinol (ZYLOPRIM) 100 MG tablet Take 1 tablet by mouth daily. Take 1 tablet once daily      . amLODipine (NORVASC) 5 MG tablet Take 1 tablet by mouth daily. Take 1 tablet once daily      . aspirin 81 MG tablet Take 81 mg by mouth daily.      Marland Kitchen donepezil (ARICEPT) 10 MG tablet Take 1 tablet by mouth at bedtime. Take 1 tablet at bedtime      . furosemide (LASIX) 20 MG tablet Take 1 tablet by mouth daily. Take 1 tablet once daily      . HYDROcodone-acetaminophen (NORCO) 10-325 MG per tablet Take one tablet by mouth every 6 hours as needed for severe pain      . LANOXIN 125 MCG tablet Take 1 tablet by mouth daily. Take 1 tablet once daily      . LANTUS 100 UNIT/ML injection Inject 12 Units into the skin daily. Inject 12 units once daily      . levETIRAcetam (KEPPRA) 250 MG tablet Take 1 tablet by mouth 2 (two) times daily. Take 1 tablet twice daily      . metFORMIN (GLUCOPHAGE) 500 MG tablet Take 1 tablet (500 mg total) by mouth 2 (two) times daily with a meal.  60 tablet  0  . metoprolol succinate (TOPROL-XL) 50 MG 24 hr tablet Take 1 tablet by  mouth 2 (two) times daily. Take 1 tablet twice daily      . oxybutynin (DITROPAN) 5 MG tablet Take 1 tablet by mouth 2 (two) times daily. Take 1 tablet in the morning and 2 tablets in the evening      . potassium chloride (K-DUR) 10 MEQ tablet Take 1 tablet by mouth daily. Take 1 tablet once daily      . simvastatin (ZOCOR) 20 MG tablet Take 1 tablet by mouth daily. Take 1 tablet once daily      . traMADol (ULTRAM) 50 MG tablet Take 1 tablet by mouth 3 (three) times daily as needed. Take 1 tablet three times daily as needed for pain      . warfarin (COUMADIN) 5 MG tablet Take 1 tablet (5 mg total) by mouth daily.  30 tablet  3   No current facility-administered medications for this visit.    Allergies  Allergen Reactions  . Penicillins Other (See Comments)    Patient states that she was told previously by a doctor to not take this medication but is unsure why.     Patient Active Problem List   Diagnosis Date Noted  . Atrial fibrillation 04/26/2010  Priority: High  . Stroke     Priority: Medium  . Hypertension     Priority: Medium  . Long term (current) use of anticoagulants 06/09/2012  . Gout 05/06/2012  . Alzheimer's disease 05/06/2012  . Hyperkalemia 05/06/2012  . 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 Uncontrolled type 2 DM with hyperosmolar nonketotic hyperglycemia 06/18/2011  . UTI (lower urinary tract infection) 06/18/2011  . HTN (hypertension), benign 06/18/2011  . A-fib 06/18/2011  . Current use of long term anticoagulation 06/18/2011  . Hyperlipidemia 06/18/2011  . AKI (acute kidney injury) 06/18/2011  . Diabetes mellitus without mention of complication 12/29/2010  . Chronic atrial fibrillation   . Hypercholesterolemia   . 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 Former Smoker  . Types: Cigarettes  . Quit date: 01/29/2001    Smokeless tobacco  . Never Used    History  Alcohol Use No    Family History  Problem Relation Age of Onset  . Cancer Mother     Cervical  . Stroke Mother   . Diabetes Mother   . Stroke Father   . Heart disease Father     Review of Systems: Constitutional: no fever chills diaphoresis or fatigue or change in weight.  Head and neck: no hearing loss, no epistaxis, no photophobia or visual disturbance. Respiratory: No cough, shortness of breath or wheezing. Cardiovascular: No chest pain peripheral edema, palpitations. Gastrointestinal: No abdominal distention, no abdominal pain, no change in bowel habits hematochezia or melena. Genitourinary: No dysuria, no frequency, no urgency, no nocturia. Musculoskeletal:No arthralgias, no back pain, no gait disturbance or myalgias. Neurological: No dizziness, no headaches, no numbness, no seizures, no syncope, no weakness, no tremors. Hematologic: No lymphadenopathy, no easy bruising. Psychiatric: No confusion, no hallucinations, no sleep disturbance.    Physical Exam: Filed Vitals:   06/09/12 1526  BP: 128/74  Pulse: 64   the general appearance reveals a well-developed well-nourished African American woman in no distress.  She has a residual right hemiparesis.The head and neck exam reveals pupils equal and reactive.  Extraocular movements are full.  There is no scleral icterus.  The mouth and pharynx are normal.  The neck is supple.  The carotids reveal no bruits.  The jugular venous pressure is normal.  The  thyroid is not enlarged.  There is no lymphadenopathy.  The chest is clear to percussion and auscultation.  There are no rales or rhonchi.  Expansion of the chest is symmetrical.  The precordium is quiet.  The pulse is irregularly irregular. The first heart sound is normal.  The second heart sound is physiologically split.  There is no murmur gallop rub or click.  There is no abnormal lift or heave.  The abdomen is soft and nontender.   The bowel sounds are normal.  The liver and spleen are not enlarged.  There are no abdominal masses.  There are no abdominal bruits.  Extremities reveal good pedal pulses.  There is no phlebitis or edema.  There is no cyanosis or clubbing.  Strength is normal and symmetrical in all extremities.  There is residual right hemiparesis  There are no sensory deficits.  The skin is warm and dry.  There is no rash.     Assessment / Plan: Continue on same medication.  Recheck in 4 months for followup office visit.

## 2012-06-10 ENCOUNTER — Ambulatory Visit (INDEPENDENT_AMBULATORY_CARE_PROVIDER_SITE_OTHER): Payer: Medicare Other | Admitting: Internal Medicine

## 2012-06-10 ENCOUNTER — Encounter: Payer: Self-pay | Admitting: Internal Medicine

## 2012-06-10 VITALS — BP 118/64 | HR 51 | Temp 97.3°F | Resp 14 | Ht 65.0 in | Wt 153.0 lb

## 2012-06-10 DIAGNOSIS — E1140 Type 2 diabetes mellitus with diabetic neuropathy, unspecified: Secondary | ICD-10-CM

## 2012-06-10 DIAGNOSIS — M21612 Bunion of left foot: Secondary | ICD-10-CM

## 2012-06-10 DIAGNOSIS — G589 Mononeuropathy, unspecified: Secondary | ICD-10-CM

## 2012-06-10 DIAGNOSIS — M21619 Bunion of unspecified foot: Secondary | ICD-10-CM

## 2012-06-10 DIAGNOSIS — E1149 Type 2 diabetes mellitus with other diabetic neurological complication: Secondary | ICD-10-CM

## 2012-06-10 DIAGNOSIS — I4891 Unspecified atrial fibrillation: Secondary | ICD-10-CM

## 2012-06-10 NOTE — Progress Notes (Signed)
Subjective:    Patient ID: Casey [name], female    DOB: Jul 05, 1940, 72 y.o.   MRN: [id number]  Chief Complaint  Patient presents with  . Need Form filled out for Diabetic Shoes   HPI 72 y/o female patient with history of HTN, CVA, diabetes, afib on coumadin and mild cognitive impairment is here for diabetic shoes. Her son is here with her today. i see him for the first time today assisting her on her visit. She has been taking all her medications. Her son helps with her medications. No recent fall or trauma reported. She has been eating well. Had seen Ronal Fear yesterday and had inr checked. Also reviewed cardiology notes  Review of Systems  Constitutional: Negative for fever, chills and diaphoresis.  HENT: Negative for congestion.   Eyes: Negative for blurred vision.  Respiratory: Negative for cough and shortness of breath.   Cardiovascular: Negative for chest pain and palpitations.  Gastrointestinal: Negative for heartburn, nausea, vomiting and abdominal pain.  Genitourinary: Negative for dysuria.  Musculoskeletal: Negative for falls.  Skin: Negative for rash.  Neurological: Negative for dizziness, tremors, weakness and headaches.  Psychiatric/Behavioral: Negative for depression.   Review of Systems  Constitutional: Negative for fever, chills and diaphoresis.  HENT: Negative for congestion.   Eyes: Negative for blurred vision.  Respiratory: Negative for cough and shortness of breath.   Cardiovascular: Negative for chest pain and palpitations.  Gastrointestinal: Negative for heartburn, nausea, vomiting and abdominal pain.  Genitourinary: Negative for dysuria.  Musculoskeletal: Negative for falls.  Skin: Negative for rash.  Neurological: Negative for dizziness, tremors, weakness and headaches.  Psychiatric/Behavioral: Negative for depression.      Objective:   Physical Exam  Constitutional:  Frail, elderly patient in no acute distress  HENT:  Head: Normocephalic and  atraumatic.  Mouth/Throat: Oropharynx is clear and moist. No oropharyngeal exudate.  Eyes: Pupils are equal, round, and reactive to light.  Neck: Normal range of motion. Neck supple. No JVD present. No thyromegaly present.  Cardiovascular: Normal rate, regular rhythm and normal heart sounds.   No murmur heard. Diminished dorsalis pedis  Pulmonary/Chest: Effort normal and breath sounds normal. No respiratory distress. She has no wheezes.  Abdominal: Soft. Bowel sounds are normal. She exhibits no distension. There is no tenderness.  Musculoskeletal: Normal range of motion. She exhibits no edema and no tenderness.  Unsteady gait, uses a cane, right UE flaccid, weakness present, also has strength 4/5 in RLE no spinous process tenderness, nail dystrophy noted nd left foot bunion present  Lymphadenopathy:    She has no cervical adenopathy.  Neurological: She is alert.  Oriented to person, place and time, no cranial nerve deficit, decreased vibration sense upto ankles, dry skin, callus in right foot dorsum and tip of right great toe, no open sores present, microfilament test decreased sensation in both feet, normal reflexes, normal proprioception and normal sensation to touch otherwise  Skin: Skin is warm. No rash noted. No erythema.  Extremely dry skin in both feet with scaling present, no open sores  Psychiatric: She has a normal mood and affect. Her behavior is normal.   BP 118/64  Pulse 51  Temp(Src) 97.3 F (36.3 C) (Oral)  Resp 14  Ht 5\' 5"  (1.651 m)  Wt 153 lb (69.4 kg)  BMI 25.46 kg/m2     Assessment & Plan:   afib- rate controlled. Continue warfarin and follow on inr in next few week. 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 On lantus and metformin at present. Continue current regimen. Monitor cbg. This am was 89. No further hypoglycemic episodes. Continue asa for now. Will benefit from diabetic shoes- custom molded shoes with  shoe inserts because of her DM to prevent sores and calluses which can lead to non healing sores.  Left foot bunion- diabetic shoes will provide help for this patient. Form filled out

## 2012-07-07 NOTE — Telephone Encounter (Signed)
error 

## 2012-07-31 ENCOUNTER — Other Ambulatory Visit: Payer: Self-pay | Admitting: Geriatric Medicine

## 2012-07-31 ENCOUNTER — Other Ambulatory Visit: Payer: Self-pay | Admitting: Internal Medicine

## 2012-07-31 MED ORDER — GLUCOSE BLOOD VI STRP: ORAL_STRIP | Status: DC

## 2012-08-04 ENCOUNTER — Ambulatory Visit (INDEPENDENT_AMBULATORY_CARE_PROVIDER_SITE_OTHER): Payer: Medicare Other | Admitting: Pharmacotherapy

## 2012-08-04 ENCOUNTER — Encounter: Payer: Self-pay | Admitting: Pharmacotherapy

## 2012-08-04 VITALS — BP 146/78 | HR 64 | Temp 96.9°F | Resp 16 | Ht 65.0 in | Wt 153.6 lb

## 2012-08-04 DIAGNOSIS — I639 Cerebral infarction, unspecified: Secondary | ICD-10-CM

## 2012-08-04 DIAGNOSIS — I4891 Unspecified atrial fibrillation: Secondary | ICD-10-CM

## 2012-08-04 DIAGNOSIS — E119 Type 2 diabetes mellitus without complications: Secondary | ICD-10-CM

## 2012-08-04 DIAGNOSIS — I6350 Cerebral infarction due to unspecified occlusion or stenosis of unspecified cerebral artery: Secondary | ICD-10-CM

## 2012-08-04 DIAGNOSIS — R569 Unspecified convulsions: Secondary | ICD-10-CM

## 2012-08-04 DIAGNOSIS — Z7901 Long term (current) use of anticoagulants: Secondary | ICD-10-CM

## 2012-08-04 MED ORDER — WARFARIN SODIUM 5 MG PO TABS: ORAL_TABLET | ORAL | Status: DC

## 2012-08-04 NOTE — Patient Instructions (Signed)
Increase Coumadin to 5mg  daily except 7.5mg  (1&1/2 tablets) on Mondays and Fridays

## 2012-08-04 NOTE — Progress Notes (Signed)
Subjective:     Patient ID: Casey [name], female   DOB: 03-08-1940, 72 y.o.   MRN: [id number]  HPI Last INR was low at 1.6 Coumadin was increased to 5mg  daily Denies missed doses. Denies unusual bleeding or bruising Denies CP, falls Consistent with vitamin K intake.   Review of Systems  HENT: Negative for nosebleeds.   Respiratory: Negative for shortness of breath.   Cardiovascular: Negative for chest pain.  Gastrointestinal: Negative for blood in stool and anal bleeding.  Genitourinary: Negative for hematuria.       Objective:   Physical Exam  Constitutional: She is oriented to person, place, and time. She appears well-developed and well-nourished.  Eyes: Conjunctivae are normal. Pupils are equal, round, and reactive to light.  Pulmonary/Chest: Effort normal.  Musculoskeletal:  Unsteady gait with cane  Neurological: She is alert and oriented to person, place, and time.  Skin: Skin is warm and dry.  Psychiatric: She has a normal mood and affect.    BP:  146/78   HR:  64  Wt:   153lb    Assessment:     INR 1.5     Plan:     1.  INR is below goal 2-3 2.  Increase Coumadin 5mg  QD except 7.5mg  M/F 3.  RTC in 3 weeks

## 2012-08-19 ENCOUNTER — Ambulatory Visit: Payer: Medicare Other | Admitting: Neurosurgery

## 2012-08-25 ENCOUNTER — Encounter: Payer: Self-pay | Admitting: Pharmacotherapy

## 2012-08-25 ENCOUNTER — Ambulatory Visit (INDEPENDENT_AMBULATORY_CARE_PROVIDER_SITE_OTHER): Payer: Medicare Other | Admitting: Pharmacotherapy

## 2012-08-25 VITALS — BP 124/60 | HR 72 | Temp 96.0°F | Resp 18 | Ht 65.0 in | Wt 154.6 lb

## 2012-08-25 DIAGNOSIS — Z7901 Long term (current) use of anticoagulants: Secondary | ICD-10-CM

## 2012-08-25 DIAGNOSIS — I6350 Cerebral infarction due to unspecified occlusion or stenosis of unspecified cerebral artery: Secondary | ICD-10-CM

## 2012-08-25 DIAGNOSIS — I4891 Unspecified atrial fibrillation: Secondary | ICD-10-CM

## 2012-08-25 DIAGNOSIS — M79609 Pain in unspecified limb: Secondary | ICD-10-CM

## 2012-08-25 DIAGNOSIS — I639 Cerebral infarction, unspecified: Secondary | ICD-10-CM

## 2012-08-25 LAB — POCT INR: INR: 2.5

## 2012-08-25 MED ORDER — HYDROCODONE-ACETAMINOPHEN 10-325 MG PO TABS: 1.0000 | ORAL_TABLET | Freq: Four times a day (QID) | ORAL | Status: DC | PRN

## 2012-08-25 NOTE — Progress Notes (Signed)
Subjective:     Patient ID: Casey [name], female   DOB: Jan 18, 1941, 72 y.o.   MRN: [id number]  HPI Last INR was low at 1.5 Coumadin was increased to 5mg  QD except 7.5mg  M/F Denies missed doses. Denies unusual bleeding or bruising. Denies CP, palpitations, falls Consistent with vitamin K intake   Review of Systems  HENT: Negative for nosebleeds.   Respiratory: Negative for shortness of breath.   Cardiovascular: Negative for chest pain and palpitations.  Gastrointestinal: Negative for blood in stool and anal bleeding.  Genitourinary: Negative for hematuria.       Objective:   Physical Exam  Constitutional: She is oriented to person, place, and time. She appears well-developed and well-nourished.  Eyes: Pupils are equal, round, and reactive to light.  Cardiovascular: An irregular rhythm present.  Pulmonary/Chest: Effort normal. She has rales.  Left sided rales  Musculoskeletal:  Staggers a little and uses cane.  Neurological: She is alert and oriented to person, place, and time.  Skin: Skin is warm and dry.  Psychiatric: She has a normal mood and affect.    BP:  124/60   HR:  72   Wt:  154 lb     Assessment:     INR 2.5     Plan:     1.  INR at goal 2-3 2.  Continue Coumadin 5mg  QD except 7.5mg  M/F 3.  RTC in 1 month

## 2012-08-25 NOTE — Patient Instructions (Signed)
Continue Coumadin 5mg  (1 tablet) Sundays, Tuesdays, Wednesdays, Thursdays, Saturdays and 7.5mg  (1 & 1/2 tablets) on Mondays and Fridays

## 2012-08-26 ENCOUNTER — Other Ambulatory Visit: Payer: Self-pay | Admitting: Geriatric Medicine

## 2012-08-26 DIAGNOSIS — M79609 Pain in unspecified limb: Secondary | ICD-10-CM

## 2012-08-26 MED ORDER — HYDROCODONE-ACETAMINOPHEN 10-325 MG PO TABS: 1.0000 | ORAL_TABLET | Freq: Four times a day (QID) | ORAL | Status: DC | PRN

## 2012-09-22 ENCOUNTER — Ambulatory Visit: Payer: Medicare Other | Admitting: Pharmacotherapy

## 2012-09-26 ENCOUNTER — Encounter: Payer: Self-pay | Admitting: Vascular Surgery

## 2012-09-30 ENCOUNTER — Encounter (INDEPENDENT_AMBULATORY_CARE_PROVIDER_SITE_OTHER): Payer: Medicare Other | Admitting: *Deleted

## 2012-09-30 ENCOUNTER — Encounter: Payer: Self-pay | Admitting: Vascular Surgery

## 2012-09-30 ENCOUNTER — Ambulatory Visit (INDEPENDENT_AMBULATORY_CARE_PROVIDER_SITE_OTHER): Payer: Medicare Other | Admitting: Vascular Surgery

## 2012-09-30 VITALS — BP 154/81 | HR 75 | Ht 65.0 in | Wt 156.0 lb

## 2012-09-30 DIAGNOSIS — Z48812 Encounter for surgical aftercare following surgery on the circulatory system: Secondary | ICD-10-CM

## 2012-09-30 DIAGNOSIS — I739 Peripheral vascular disease, unspecified: Secondary | ICD-10-CM

## 2012-09-30 NOTE — Progress Notes (Signed)
Subjective:     Patient ID: Casey [name], female   DOB: Dec 20, 1940, 72 y.o.   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 Woodfin Ganja. She also has a left femoral-popliteal graft placed by Dr. Georganna Skeans in the past. She has no specific complaints today.  Past Medical History  Diagnosis Date  . Chronic atrial fibrillation   . Stroke   . Hypertension   . Hypercholesterolemia   . Diabetes mellitus   . Sinus of Valsalva aneurysm     a. By 2D echo 05/2011.  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  . CHF (congestive heart failure)   . GERD (gastroesophageal reflux disease)   . Seizures   . Urinary frequency   . Hypopotassemia   . Long term (current) use of anticoagulants   . Unspecified disorder of kidney and ureter   . Unspecified hereditary and idiopathic peripheral neuropathy   . Unspecified late effects of cerebrovascular disease   . Edema   . Peripheral vascular disease, unspecified   . Unspecified urinary incontinence   . Congestive heart failure, unspecified   . Gout, unspecified   . Abdominal or pelvic swelling, mass, or lump, left upper quadrant   . Vascular dementia, uncomplicated   . Electrolyte and fluid disorders not elsewhere classified   . Hypopotassemia   . Long term (current) use of anticoagulants   . Chronic kidney disease   . Unspecified hereditary and idiopathic peripheral neuropathy   . Unspecified late effects of cerebrovascular disease   . Coronary atherosclerosis of unspecified type of vessel, native or graft   . Peripheral vascular disease, unspecified   . Gout, unspecified     History  Substance Use Topics  . Smoking status: Former Smoker    Types: Cigarettes    Quit date: 01/29/2001  . Smokeless  tobacco: Never Used  . Alcohol Use: No    Family History  Problem Relation Age of Onset  . Cancer Mother     Cervical  . Stroke Mother   . Diabetes Mother   . Stroke Father   . Heart disease Father     Allergies  Allergen Reactions  . Penicillins Other (See Comments)    Patient states that she was told previously by a doctor to not take this medication but is unsure why.     Current outpatient prescriptions:allopurinol (ZYLOPRIM) 100 MG tablet, Take 1 tablet by mouth daily. Take 1 tablet once daily, Disp: , Rfl: ;  amLODipine (NORVASC) 5 MG tablet, Take 1 tablet by mouth daily. Take 1 tablet once daily, Disp: , Rfl: ;  ARICEPT 10 MG tablet, Take 10 mg by mouth at bedtime., Disp: , Rfl: ;  aspirin 81 MG tablet, Take 81 mg by mouth daily., Disp: , Rfl: ;  DIGOX 125 MCG tablet, , Disp: , Rfl:  donepezil (ARICEPT) 10 MG tablet, Take 1 tablet by mouth at bedtime. Take 1 tablet at bedtime, Disp: , Rfl: ;  furosemide (LASIX) 20 MG tablet, Take 1 tablet by mouth daily. Take 1 tablet once daily, Disp: , Rfl: ;  glucose blood (TRUETEST TEST) test strip, Test blood sugar twice daily to control blood sugar. 250.00, Disp: 100 each, Rfl: 12 HYDROcodone-acetaminophen (NORCO) 10-325 MG per tablet, Take 1 tablet by mouth  every 6 (six) hours as needed for pain. Take one tablet by mouth every 6 hours as needed for severe pain, Disp: 120 tablet, Rfl: 0;  LANTUS SOLOSTAR 100 UNIT/ML SOPN, , Disp: , Rfl: ;  levETIRAcetam (KEPPRA) 250 MG tablet, , Disp: , Rfl: ;  metFORMIN (GLUCOPHAGE) 500 MG tablet, , Disp: , Rfl:  metoprolol succinate (TOPROL-XL) 50 MG 24 hr tablet, Take 1 tablet by mouth 2 (two) times daily. Take 1 tablet twice daily, Disp: , Rfl: ;  oxybutynin (DITROPAN) 5 MG tablet, Take 1 tablet by mouth 2 (two) times daily. Take 1 tablet in the morning and 2 tablets in the evening, Disp: , Rfl: ;  potassium chloride (K-DUR) 10 MEQ tablet, Take 1 tablet by mouth daily. Take 1 tablet once daily, Disp: , Rfl:   simvastatin (ZOCOR) 20 MG tablet, Take 1 tablet by mouth daily. Take 1 tablet once daily, Disp: , Rfl: ;  traMADol (ULTRAM) 50 MG tablet, Take 1 tablet by mouth 3 (three) times daily as needed. Take 1 tablet three times daily as needed for pain, Disp: , Rfl: ;  warfarin (COUMADIN) 5 MG tablet, Take 1 tablet on Sundays, Tuesday, Wednesday, Thursday, and Saturdays.  Take 1&1/2 tablets on Mondays and Fridays, Disp: 30 tablet, Rfl: 3 metFORMIN (GLUCOPHAGE) 500 MG tablet, Take 1 tablet (500 mg total) by mouth 2 (two) times daily with a meal., Disp: 60 tablet, Rfl: 0  BP 154/81  Pulse 75  Ht 5\' 5"  (1.651 m)  Wt 156 lb (70.761 kg)  BMI 25.96 kg/m2  SpO2 100%  Body mass index is 25.96 kg/(m^2).          Review of Systems denies chest pain, dyspnea on exertion. Walks with a cane.    Objective:   Physical Exam BP 154/81  Pulse 75  Ht 5\' 5"  (1.651 m)  Wt 156 lb (70.761 kg)  BMI 25.96 kg/m2  SpO2 100%  Gen.-alert and oriented x3 in no apparent distress HEENT normal for age Lungs no rhonchi or wheezing Cardiovascular regular rhythm no murmurs carotid pulses 3+ palpable no bruits audible Abdomen soft nontender no palpable masses Musculoskeletal free of  major deformities Skin clear -no rashes Neurologic normal Lower extremities 3+ femoral pulses bilaterally. No evidence of recurrence of pseudoaneurysm in left inguinal area. Both the well-perfused.  Today I ordered lower chimney ABIs which was 1.07 on the left and 0.63 on the right. Also ordered a duplex scan of the right to left femoral-femoral graft in the left femoral-popliteal graft and there is no evidence of stenosis graft all being widely patent        Assessment:     Widely patent femoral-femoral bypass and left femoral-popliteal vein graft no evidence of restenosis    Plan:     Patient return in one year with repeat ABIs and duplex scan and seen by the nurse practitioner

## 2012-10-07 ENCOUNTER — Ambulatory Visit: Payer: Medicare Other | Admitting: Internal Medicine

## 2012-10-07 ENCOUNTER — Telehealth: Payer: Self-pay | Admitting: *Deleted

## 2012-10-07 NOTE — Telephone Encounter (Signed)
Patient son, Casimiro Needle, called and stated that he dropped off a form for test strips last week to Dr. Renato Gails and wants to know if it was faxed back to the pharmacy? Please Advise.

## 2012-10-07 NOTE — Telephone Encounter (Signed)
Diabetic Form for Test strips for [facility] filled out by Dr. Glade Lloyd and faxed to [facility] Fax#: [phone]

## 2012-10-10 ENCOUNTER — Ambulatory Visit: Payer: Medicare Other | Admitting: Cardiology

## 2012-10-13 ENCOUNTER — Other Ambulatory Visit: Payer: Self-pay | Admitting: *Deleted

## 2012-10-14 ENCOUNTER — Other Ambulatory Visit: Payer: Self-pay | Admitting: *Deleted

## 2012-10-14 MED ORDER — GLUCOSE BLOOD VI STRP: ORAL_STRIP | Status: DC

## 2012-10-27 ENCOUNTER — Other Ambulatory Visit: Payer: Self-pay | Admitting: *Deleted

## 2012-10-27 ENCOUNTER — Other Ambulatory Visit: Payer: Self-pay | Admitting: Internal Medicine

## 2012-10-27 DIAGNOSIS — E119 Type 2 diabetes mellitus without complications: Secondary | ICD-10-CM

## 2012-10-27 MED ORDER — "INSULIN SYRINGE-NEEDLE U-100 31G X 5/16"" 0.3 ML MISC": Status: DC

## 2012-10-28 ENCOUNTER — Encounter: Payer: Self-pay | Admitting: Cardiology

## 2012-11-25 ENCOUNTER — Ambulatory Visit (INDEPENDENT_AMBULATORY_CARE_PROVIDER_SITE_OTHER): Payer: Medicare Other | Admitting: Cardiology

## 2012-11-25 ENCOUNTER — Encounter: Payer: Self-pay | Admitting: Cardiology

## 2012-11-25 ENCOUNTER — Other Ambulatory Visit: Payer: Self-pay | Admitting: *Deleted

## 2012-11-25 ENCOUNTER — Encounter (INDEPENDENT_AMBULATORY_CARE_PROVIDER_SITE_OTHER): Payer: Self-pay

## 2012-11-25 VITALS — BP 144/76 | HR 71 | Ht 65.0 in | Wt 160.0 lb

## 2012-11-25 DIAGNOSIS — I639 Cerebral infarction, unspecified: Secondary | ICD-10-CM

## 2012-11-25 DIAGNOSIS — I4891 Unspecified atrial fibrillation: Secondary | ICD-10-CM

## 2012-11-25 DIAGNOSIS — I482 Chronic atrial fibrillation, unspecified: Secondary | ICD-10-CM

## 2012-11-25 DIAGNOSIS — F0280 Dementia in other diseases classified elsewhere without behavioral disturbance: Secondary | ICD-10-CM

## 2012-11-25 DIAGNOSIS — I119 Hypertensive heart disease without heart failure: Secondary | ICD-10-CM

## 2012-11-25 DIAGNOSIS — I6350 Cerebral infarction due to unspecified occlusion or stenosis of unspecified cerebral artery: Secondary | ICD-10-CM

## 2012-11-25 MED ORDER — AMBULATORY NON FORMULARY MEDICATION: Status: DC

## 2012-11-25 NOTE — Progress Notes (Signed)
[name] Date of Birth:  07/19/1940 [address] [place], [place]  [address] [phone]         Fax   [phone]  History of Present Illness: This pleasant 72 year old [place] American woman is seen for a scheduled followup office visit.  She has a history of atrial fibrillation and prior embolic strokes. She has a long history of high blood pressure. She has diabetes and dyslipidemia. She now has a new primary care provider at [facility] care who is helping her with her insulin.  She states that her heart has been feeling better. She has not been having any chest pain or shortness of breath. She apparently was hospitalized in may 2013 with poorly controlled diabetes.  Her prothrombin times have been followed at [facility].  However, the patient has not had a protime since July.  The patient has some early dementia and memory problems.  Her son is her caregiver but was not aware that the patient needed to be carried to [facility] on a regular basis in order to get her INR checked.   Current Outpatient Prescriptions  Medication Sig Dispense Refill  . allopurinol (ZYLOPRIM) 100 MG tablet Take 1 tablet by mouth daily. Take 1 tablet once daily      . amLODipine (NORVASC) 5 MG tablet Take 1 tablet by mouth daily. Take 1 tablet once daily      . aspirin 81 MG tablet Take 81 mg by mouth daily.      Marland Kitchen DIGOX 125 MCG tablet       . donepezil (ARICEPT) 10 MG tablet Take 1 tablet by mouth at bedtime. Take 1 tablet at bedtime      . furosemide (LASIX) 20 MG tablet Take 1 tablet by mouth daily. Take 1 tablet once daily      . glucose blood (TRUETEST TEST) test strip Test blood sugar twice daily to control blood sugar. 250.00  100 each  12  . HYDROcodone-acetaminophen (NORCO) 10-325 MG per tablet Take 1 tablet by mouth every 6 (six) hours as needed for pain. Take one tablet by mouth every 6 hours as needed for severe pain  120 tablet  0  . Insulin  Syringe-Needle U-100 (BD INSULIN SYRINGE ULTRAFINE) 31G X 5/16" 0.3 ML MISC USE TO GIVE NOVOLOG AND LANTUS INSULIN 4 TIMES A DAY.  100 each  3  . LANTUS SOLOSTAR 100 UNIT/ML SOPN       . levETIRAcetam (KEPPRA) 250 MG tablet       . metFORMIN (GLUCOPHAGE) 500 MG tablet       . metoprolol succinate (TOPROL-XL) 50 MG 24 hr tablet Take 1 tablet by mouth 2 (two) times daily. Take 1 tablet twice daily      . oxybutynin (DITROPAN) 5 MG tablet Take 1 tablet by mouth 2 (two) times daily. Take 1 tablet in the morning and 2 tablets in the evening      . potassium chloride (K-DUR) 10 MEQ tablet Take 1 tablet by mouth daily. Take 1 tablet once daily      . simvastatin (ZOCOR) 20 MG tablet Take 1 tablet by mouth daily. Take 1 tablet once daily      . traMADol (ULTRAM) 50 MG tablet Take 1 tablet by mouth 3 (three) times daily as needed. Take 1 tablet three times daily as needed for pain      . warfarin (COUMADIN) 5 MG tablet Take 1 tablet on Sundays, Tuesday, Wednesday,  Thursday, and Saturdays.  Take 1&1/2 tablets on Mondays and Fridays  30 tablet  3  . AMBULATORY NON FORMULARY MEDICATION BD Lancets Use as Directed to check blood sugar Dx: 250.00  100 Units  11  . metFORMIN (GLUCOPHAGE) 500 MG tablet Take 1 tablet (500 mg total) by mouth 2 (two) times daily with a meal.  60 tablet  0   No current facility-administered medications for this visit.    Allergies  Allergen Reactions  . Penicillins Other (See Comments)    Patient states that she was told previously by a doctor to not take this medication but is unsure why.     Patient Active Problem List   Diagnosis Date Noted  . Atrial fibrillation 04/26/2010    Priority: High  . Stroke     Priority: Medium  . Hypertension     Priority: Medium  . PVD (peripheral vascular disease) 09/30/2012  . Bunion 06/10/2012  . Long term (current) use of anticoagulants 06/09/2012  . Gout 05/06/2012  . Alzheimer's disease 05/06/2012  . Hyperkalemia 05/06/2012  .  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 Uncontrolled type 2 DM with hyperosmolar nonketotic hyperglycemia 06/18/2011  . UTI (lower urinary tract infection) 06/18/2011  . HTN (hypertension), benign 06/18/2011  . A-fib 06/18/2011  . Current use of long term anticoagulation 06/18/2011  . Hyperlipidemia 06/18/2011  . AKI (acute kidney injury) 06/18/2011  . Type 2 diabetes mellitus with diabetic neuropathy 12/29/2010  . Chronic atrial fibrillation   . Hypercholesterolemia   . 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 Former Smoker  . Types: Cigarettes  . Quit date: 01/29/2001  Smokeless tobacco  . Never Used    History  Alcohol Use No    Family History  Problem Relation Age of Onset  . Cancer Mother     Cervical  . Stroke Mother   . Diabetes Mother   . Stroke Father   . Heart disease Father     Review of Systems: Constitutional: no fever chills diaphoresis or fatigue or change in weight.  Head and neck: no hearing loss, no epistaxis, no photophobia or visual disturbance. Respiratory: No cough, shortness of breath or wheezing. Cardiovascular: No chest pain peripheral edema, palpitations. Gastrointestinal: No abdominal distention, no abdominal pain, no change in bowel habits hematochezia or melena. Genitourinary: No dysuria, no frequency, no urgency, no nocturia. Musculoskeletal:No arthralgias, no back pain, no gait disturbance or myalgias. Neurological: No dizziness, no headaches, no numbness, no seizures, no syncope, no weakness, no tremors. Hematologic: No lymphadenopathy, no easy bruising. Psychiatric: No confusion, no hallucinations, no sleep disturbance.    Physical Exam: Filed Vitals:   11/25/12 1424  BP: 144/76  Pulse: 71   the general appearance reveals a well-developed well-nourished African American woman in no distress.  She  has a residual right hemiparesis.The head and neck exam reveals pupils equal and reactive.  Extraocular movements are full.  There is no scleral icterus.  The mouth and pharynx are normal.  The neck is supple.  The carotids reveal no bruits.  The jugular venous pressure is normal.  The  thyroid is not enlarged.  There is no lymphadenopathy.  The chest is clear to percussion and auscultation.  There are no rales or rhonchi.  Expansion of the chest is symmetrical.  The precordium is quiet.  The pulse is irregularly irregular. The first heart sound  is normal.  The second heart sound is physiologically split.  There is no murmur gallop rub or click.  There is no abnormal lift or heave.  The abdomen is soft and nontender.  The bowel sounds are normal.  The liver and spleen are not enlarged.  There are no abdominal masses.  There are no abdominal bruits.  Extremities reveal good pedal pulses.  There is no phlebitis or edema.  There is no cyanosis or clubbing.  Strength is normal and symmetrical in all extremities.  There is residual right hemiparesis  There are no sensory deficits.  The skin is warm and dry.  There is no rash.     Assessment / Plan: Continue on same medication.  We called [facility] care and the patient has an appointment to have her INR checked there within the next several days.  Recheck here in 3 months for followup office visit and EKG.

## 2012-11-25 NOTE — Assessment & Plan Note (Signed)
The patient has not had any symptoms of recurrent TIA or stroke

## 2012-11-25 NOTE — Assessment & Plan Note (Signed)
The patient is in permanent atrial fibrillation.  We considered switching her to Xarelto at a dose of 15 mg daily.  However, her son indicates that he will be careful to carry her to her appointments on a regular basis for her INR checks.  Therefore, we will continue her on Coumadin.  She has done well on Coumadin over the years since her stroke.  We emphasized to her today who reported his to get it checked regularly however

## 2012-11-25 NOTE — Patient Instructions (Addendum)
Your physician recommends that you continue on your current medications as directed. Please refer to the Current Medication list given to you today.  Your physician wants you to follow-up in: 3 MONTH OV You will receive a reminder letter in the mail two months in advance. If you don't receive a letter, please call our office to schedule the follow-up appointment.   GO Monday FOR COUMADIN CHECK TO [facility]

## 2012-11-25 NOTE — Assessment & Plan Note (Signed)
The patient's memory is worse.  She really is not aware of what medicines she is on or aware of the fact that she was not having her prothrombin times checked.

## 2012-11-26 ENCOUNTER — Other Ambulatory Visit: Payer: Self-pay | Admitting: *Deleted

## 2012-12-01 ENCOUNTER — Ambulatory Visit: Payer: Medicare Other | Admitting: Pharmacotherapy

## 2012-12-03 ENCOUNTER — Telehealth: Payer: Self-pay | Admitting: *Deleted

## 2012-12-03 NOTE — Telephone Encounter (Signed)
Patient was in to see  Dr. Patty Sermons on 11/25/12 and had not had a recent INR check. Spoke with son and he stated he would be willing to take his mother for INR checks at [facility] office but had not known about the visits. Spoke with [facility] and scheduled INR on 12/01/12. At the visit on AVS highlighted dated and time and went over with patient and son. Son verbalized understanding.  Followed up to see if patient showed up. Per epic patient cancelled secondary to transportation issues. Called to speak with the son today, Left message to call back

## 2012-12-24 NOTE — Telephone Encounter (Signed)
Tried to call patient and son, unable to reach.

## 2013-01-17 ENCOUNTER — Other Ambulatory Visit: Payer: Self-pay | Admitting: Internal Medicine

## 2013-02-02 ENCOUNTER — Emergency Department (HOSPITAL_COMMUNITY): Payer: Medicare Other

## 2013-02-02 ENCOUNTER — Inpatient Hospital Stay (HOSPITAL_COMMUNITY)
Admission: EM | Admit: 2013-02-02 | Discharge: 2013-02-07 | DRG: 065 | Disposition: A | Discharge status: Skilled Nursing Facility | Payer: Medicare Other | Attending: Internal Medicine | Admitting: Internal Medicine

## 2013-02-02 ENCOUNTER — Encounter (HOSPITAL_COMMUNITY): Payer: Self-pay | Admitting: Emergency Medicine

## 2013-02-02 DIAGNOSIS — N189 Chronic kidney disease, unspecified: Secondary | ICD-10-CM

## 2013-02-02 DIAGNOSIS — N390 Urinary tract infection, site not specified: Secondary | ICD-10-CM

## 2013-02-02 DIAGNOSIS — E1129 Type 2 diabetes mellitus with other diabetic kidney complication: Secondary | ICD-10-CM | POA: Diagnosis present

## 2013-02-02 DIAGNOSIS — I10 Essential (primary) hypertension: Secondary | ICD-10-CM

## 2013-02-02 DIAGNOSIS — E1140 Type 2 diabetes mellitus with diabetic neuropathy, unspecified: Secondary | ICD-10-CM

## 2013-02-02 DIAGNOSIS — I2510 Atherosclerotic heart disease of native coronary artery without angina pectoris: Secondary | ICD-10-CM | POA: Diagnosis present

## 2013-02-02 DIAGNOSIS — K219 Gastro-esophageal reflux disease without esophagitis: Secondary | ICD-10-CM | POA: Diagnosis present

## 2013-02-02 DIAGNOSIS — Z87891 Personal history of nicotine dependence: Secondary | ICD-10-CM

## 2013-02-02 DIAGNOSIS — E785 Hyperlipidemia, unspecified: Secondary | ICD-10-CM

## 2013-02-02 DIAGNOSIS — I70219 Atherosclerosis of native arteries of extremities with intermittent claudication, unspecified extremity: Secondary | ICD-10-CM

## 2013-02-02 DIAGNOSIS — E875 Hyperkalemia: Secondary | ICD-10-CM

## 2013-02-02 DIAGNOSIS — I5022 Chronic systolic (congestive) heart failure: Secondary | ICD-10-CM | POA: Diagnosis present

## 2013-02-02 DIAGNOSIS — H47619 Cortical blindness, unspecified side of brain: Secondary | ICD-10-CM | POA: Diagnosis present

## 2013-02-02 DIAGNOSIS — I6990 Unspecified sequelae of unspecified cerebrovascular disease: Secondary | ICD-10-CM | POA: Diagnosis present

## 2013-02-02 DIAGNOSIS — G309 Alzheimer's disease, unspecified: Secondary | ICD-10-CM | POA: Diagnosis present

## 2013-02-02 DIAGNOSIS — F0390 Unspecified dementia without behavioral disturbance: Secondary | ICD-10-CM | POA: Diagnosis present

## 2013-02-02 DIAGNOSIS — E1329 Other specified diabetes mellitus with other diabetic kidney complication: Secondary | ICD-10-CM

## 2013-02-02 DIAGNOSIS — Z856 Personal history of leukemia: Secondary | ICD-10-CM

## 2013-02-02 DIAGNOSIS — N179 Acute kidney failure, unspecified: Secondary | ICD-10-CM

## 2013-02-02 DIAGNOSIS — I482 Chronic atrial fibrillation, unspecified: Secondary | ICD-10-CM

## 2013-02-02 DIAGNOSIS — E1100 Type 2 diabetes mellitus with hyperosmolarity without nonketotic hyperglycemic-hyperosmolar coma (NKHHC): Secondary | ICD-10-CM

## 2013-02-02 DIAGNOSIS — Z7982 Long term (current) use of aspirin: Secondary | ICD-10-CM

## 2013-02-02 DIAGNOSIS — E0822 Diabetes mellitus due to underlying condition with diabetic chronic kidney disease: Secondary | ICD-10-CM

## 2013-02-02 DIAGNOSIS — I739 Peripheral vascular disease, unspecified: Secondary | ICD-10-CM

## 2013-02-02 DIAGNOSIS — H539 Unspecified visual disturbance: Secondary | ICD-10-CM | POA: Diagnosis present

## 2013-02-02 DIAGNOSIS — R569 Unspecified convulsions: Secondary | ICD-10-CM | POA: Diagnosis present

## 2013-02-02 DIAGNOSIS — G8190 Hemiplegia, unspecified affecting unspecified side: Secondary | ICD-10-CM | POA: Diagnosis present

## 2013-02-02 DIAGNOSIS — I69398 Other sequelae of cerebral infarction: Secondary | ICD-10-CM

## 2013-02-02 DIAGNOSIS — M109 Gout, unspecified: Secondary | ICD-10-CM

## 2013-02-02 DIAGNOSIS — C9110 Chronic lymphocytic leukemia of B-cell type not having achieved remission: Secondary | ICD-10-CM | POA: Diagnosis present

## 2013-02-02 DIAGNOSIS — Z79899 Other long term (current) drug therapy: Secondary | ICD-10-CM

## 2013-02-02 DIAGNOSIS — H547 Unspecified visual loss: Secondary | ICD-10-CM

## 2013-02-02 DIAGNOSIS — E1142 Type 2 diabetes mellitus with diabetic polyneuropathy: Secondary | ICD-10-CM | POA: Diagnosis present

## 2013-02-02 DIAGNOSIS — I5020 Unspecified systolic (congestive) heart failure: Secondary | ICD-10-CM | POA: Diagnosis present

## 2013-02-02 DIAGNOSIS — I129 Hypertensive chronic kidney disease with stage 1 through stage 4 chronic kidney disease, or unspecified chronic kidney disease: Secondary | ICD-10-CM | POA: Diagnosis present

## 2013-02-02 DIAGNOSIS — I639 Cerebral infarction, unspecified: Secondary | ICD-10-CM

## 2013-02-02 DIAGNOSIS — I6350 Cerebral infarction due to unspecified occlusion or stenosis of unspecified cerebral artery: Principal | ICD-10-CM | POA: Diagnosis present

## 2013-02-02 DIAGNOSIS — E1149 Type 2 diabetes mellitus with other diabetic neurological complication: Secondary | ICD-10-CM | POA: Diagnosis present

## 2013-02-02 DIAGNOSIS — F0280 Dementia in other diseases classified elsewhere without behavioral disturbance: Secondary | ICD-10-CM

## 2013-02-02 DIAGNOSIS — E7800 Pure hypercholesterolemia, unspecified: Secondary | ICD-10-CM

## 2013-02-02 DIAGNOSIS — I4891 Unspecified atrial fibrillation: Secondary | ICD-10-CM

## 2013-02-02 DIAGNOSIS — Z7901 Long term (current) use of anticoagulants: Secondary | ICD-10-CM

## 2013-02-02 DIAGNOSIS — I509 Heart failure, unspecified: Secondary | ICD-10-CM | POA: Diagnosis present

## 2013-02-02 LAB — CBC
HCT: 42.1 % (ref 36.0–46.0)
Hemoglobin: 14.1 g/dL (ref 12.0–15.0)
MCH: 29.2 pg (ref 26.0–34.0)
MCHC: 33.5 g/dL (ref 30.0–36.0)
MCV: 87.2 fL (ref 78.0–100.0)
PLATELETS: 184 10*3/uL (ref 150–400)
RBC: 4.83 MIL/uL (ref 3.87–5.11)
RDW: 15.2 % (ref 11.5–15.5)
WBC: 8.8 10*3/uL (ref 4.0–10.5)

## 2013-02-02 LAB — DIFFERENTIAL
BASOS ABS: 0.1 10*3/uL (ref 0.0–0.1)
Basophils Relative: 1 % (ref 0–1)
EOS ABS: 0.3 10*3/uL (ref 0.0–0.7)
EOS PCT: 3 % (ref 0–5)
LYMPHS ABS: 1.8 10*3/uL (ref 0.7–4.0)
LYMPHS PCT: 20 % (ref 12–46)
Monocytes Absolute: 0.6 10*3/uL (ref 0.1–1.0)
Monocytes Relative: 7 % (ref 3–12)
NEUTROS PCT: 70 % (ref 43–77)
Neutro Abs: 6.1 10*3/uL (ref 1.7–7.7)

## 2013-02-02 LAB — COMPREHENSIVE METABOLIC PANEL
ALBUMIN: 3.7 g/dL (ref 3.5–5.2)
ALK PHOS: 93 U/L (ref 39–117)
ALT: 11 U/L (ref 0–35)
AST: 22 U/L (ref 0–37)
BUN: 26 mg/dL — ABNORMAL HIGH (ref 6–23)
CALCIUM: 9.8 mg/dL (ref 8.4–10.5)
CO2: 26 mEq/L (ref 19–32)
Chloride: 102 mEq/L (ref 96–112)
Creatinine, Ser: 1.33 mg/dL — ABNORMAL HIGH (ref 0.50–1.10)
GFR calc Af Amer: 45 mL/min — ABNORMAL LOW (ref >90)
GFR calc non Af Amer: 39 mL/min — ABNORMAL LOW (ref >90)
Glucose, Bld: 152 mg/dL — ABNORMAL HIGH (ref 70–99)
Potassium: 5.3 mEq/L (ref 3.7–5.3)
Sodium: 139 mEq/L (ref 137–147)
TOTAL PROTEIN: 7.9 g/dL (ref 6.0–8.3)
Total Bilirubin: 0.4 mg/dL (ref 0.3–1.2)

## 2013-02-02 LAB — POCT I-STAT TROPONIN I: TROPONIN I, POC: 0.03 ng/mL (ref 0.00–0.08)

## 2013-02-02 LAB — POCT I-STAT, CHEM 8
BUN: 27 mg/dL — ABNORMAL HIGH (ref 6–23)
CALCIUM ION: 1.27 mmol/L (ref 1.13–1.30)
Chloride: 106 mEq/L (ref 96–112)
Creatinine, Ser: 1.4 mg/dL — ABNORMAL HIGH (ref 0.50–1.10)
Glucose, Bld: 152 mg/dL — ABNORMAL HIGH (ref 70–99)
HEMATOCRIT: 46 % (ref 36.0–46.0)
Hemoglobin: 15.6 g/dL — ABNORMAL HIGH (ref 12.0–15.0)
Potassium: 5.2 mEq/L (ref 3.7–5.3)
Sodium: 140 mEq/L (ref 137–147)
TCO2: 25 mmol/L (ref 0–100)

## 2013-02-02 LAB — URINALYSIS, ROUTINE W REFLEX MICROSCOPIC
BILIRUBIN URINE: NEGATIVE
Glucose, UA: NEGATIVE mg/dL
Hgb urine dipstick: NEGATIVE
KETONES UR: NEGATIVE mg/dL
LEUKOCYTES UA: NEGATIVE
NITRITE: NEGATIVE
PROTEIN: NEGATIVE mg/dL
Specific Gravity, Urine: 1.008 (ref 1.005–1.030)
UROBILINOGEN UA: 0.2 mg/dL (ref 0.0–1.0)
pH: 5.5 (ref 5.0–8.0)

## 2013-02-02 LAB — PROTIME-INR
INR: 0.97 (ref 0.00–1.49)
PROTHROMBIN TIME: 12.7 s (ref 11.6–15.2)

## 2013-02-02 LAB — RAPID URINE DRUG SCREEN, HOSP PERFORMED
AMPHETAMINES: NOT DETECTED
BARBITURATES: NOT DETECTED
BENZODIAZEPINES: NOT DETECTED
COCAINE: NOT DETECTED
Opiates: NOT DETECTED
TETRAHYDROCANNABINOL: NOT DETECTED

## 2013-02-02 LAB — TROPONIN I: Troponin I: <0.3 ng/mL (ref <0.30)

## 2013-02-02 LAB — APTT: aPTT: 28 seconds (ref 24–37)

## 2013-02-02 LAB — ETHANOL

## 2013-02-02 MED ORDER — IOHEXOL 350 MG/ML SOLN
80.0000 mL | Freq: Once | INTRAVENOUS | Status: AC | PRN
  Administered 2013-02-02: 80 mL via INTRAVENOUS

## 2013-02-02 MED ORDER — TETRACAINE HCL 0.5 % OP SOLN
2.0000 [drp] | Freq: Once | OPHTHALMIC | Status: DC
  Filled 2013-02-02: qty 2

## 2013-02-02 NOTE — ED Notes (Signed)
Pt in from home, called by friend due to patient not acting right, pt LSN of Friday, pt today c/o loss of vision, states she could see fine this morning but is a poor historian, c/o headache earlier today but denies upon arrival, then c/o headache again, EDP at bedside upon arrival to evaluate for possible code stroke, not called due to LSN

## 2013-02-02 NOTE — Consult Note (Addendum)
Referring Physician: Mingo Amber    Chief Complaint: Loss of vision  HPI: Casey [name] is an 73 y.o. female with a history of stroke who presents today with vision loss.  The patient is a poor historian.  It seems that prior to this event she had difficulty seeing to one side but is unable to tell me which side.  The patient reports that 1-2 days ago she began to feel that she was gradually losing her vision.  She reports that today she was unable to see at all.  It seems that at some point in time she was able to contact friends who called EMS.  Patient was brought in for evaluation.    Date last known well: Unable to determine Time last known well: Unable to determine tPA Given: No: Unable to determine LKW, On Coumadin  Past Medical History  Diagnosis Date  . Chronic atrial fibrillation   . Stroke   . Hypertension   . Hypercholesterolemia   . Diabetes mellitus   . Sinus of Valsalva aneurysm     a. By 2D echo 05/2011.  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  . CHF (congestive heart failure)   . GERD (gastroesophageal reflux disease)   . Seizures   . Urinary frequency   . Hypopotassemia   . Long term (current) use of anticoagulants   . Unspecified disorder of kidney and ureter   . Unspecified hereditary and idiopathic peripheral neuropathy   . Unspecified late effects of cerebrovascular disease   . Edema   . Peripheral vascular disease, unspecified   . Unspecified urinary incontinence   . Congestive heart failure, unspecified   . Gout, unspecified   . Abdominal or pelvic swelling, mass, or lump, left upper quadrant   . Vascular dementia, uncomplicated   . Electrolyte and fluid disorders not elsewhere classified   . Hypopotassemia   . Long term (current) use of anticoagulants   . Chronic kidney disease   . Unspecified hereditary and idiopathic peripheral  neuropathy   . Unspecified late effects of cerebrovascular disease   . Coronary atherosclerosis of unspecified type of vessel, native or graft   . Peripheral vascular disease, unspecified   . Gout, unspecified     Past Surgical History  Procedure Laterality Date  . Femoral to femoral bypass graft     . Femoral artery exploration  12/06/2011    Procedure: FEMORAL ARTERY EXPLORATION;  Surgeon: Angelia Mould, MD;  Location: [facility] OR;  Service: Vascular;  Laterality: N/A;  Exploration of large pseudoaneurysm left side of fem-fem bypass graft   . Femoral-femoral bypass graft  12/06/2011    Procedure: BYPASS GRAFT FEMORAL-FEMORAL ARTERY;  Surgeon: Angelia Mould, MD;  Location: [facility] OR;  Service: Vascular;  Laterality: Bilateral;  Revision of left to right Femoral-Femoral bypass graft  . False aneurysm repair  12/06/2011    Procedure: REPAIR FALSE ANEURYSM;  Surgeon: Angelia Mould, MD;  Location: [place];  Service: Vascular;  Laterality: Left;  Repair of left femoral Artery pseudoaneurysm  . Femoral-popliteal bypass graft Left 05/2002    lower extremity femoral to below knee w/ non-versed greater saphenous vein  . Femoral-popliteal bypass graft Left 11/2002  . Tubal ligation Bilateral   . Aortogram  09/01/2008    Abd w/ bilateral lower extremity runoff arteriography    Family History  Problem Relation Age of Onset  .  Cancer Mother     Cervical  . Stroke Mother   . Diabetes Mother   . Stroke Father   . Heart disease Father    Social History:  reports that she quit smoking about 12 years ago. Her smoking use included Cigarettes. She smoked 0.00 packs per day. She has never used smokeless tobacco. She reports that she does not drink alcohol or use illicit drugs.  Allergies:  Allergies  Allergen Reactions  . Penicillins Other (See Comments)    Patient states that she was told previously by a doctor to not take this medication but is unsure why.     Medications: I have  reviewed the patient's current medications. Prior to Admission:  Current outpatient prescriptions: allopurinol (ZYLOPRIM) 100 MG tablet, Take 1 tablet by mouth daily. Take 1 tablet once daily, Disp: , Rfl: ;   AMBULATORY NON FORMULARY MEDICATION, BD Lancets Use as Directed to check blood sugar Dx: 250.00, Disp: 100 Units, Rfl: 11;  amLODipine (NORVASC) 5 MG tablet, Take 1 tablet by mouth daily. Take 1 tablet once daily, Disp: , Rfl: ;   aspirin 81 MG tablet, Take 81 mg by mouth daily., Disp: , Rfl:  DIGOX 125 MCG tablet, , Disp: , Rfl: ;   donepezil (ARICEPT) 10 MG tablet, Take 1 tablet by mouth at bedtime. Take 1 tablet at bedtime, Disp: , Rfl: ;   furosemide (LASIX) 20 MG tablet, Take 1 tablet by mouth daily. Take 1 tablet once daily, Disp: , Rfl: ;   glucose blood (TRUETEST TEST) test strip, Test blood sugar twice daily to control blood sugar. 250.00, Disp: 100 each, Rfl: 12 HYDROcodone-acetaminophen (NORCO) 10-325 MG per tablet, Take 1 tablet by mouth every 6 (six) hours as needed for pain. Take one tablet by mouth every 6 hours as needed for severe pain, Disp: 120 tablet, Rfl: 0;   Insulin Syringe-Needle U-100 (BD INSULIN SYRINGE ULTRAFINE) 31G X 5/16" 0.3 ML MISC, USE TO GIVE NOVOLOG AND LANTUS INSULIN 4 TIMES A DAY., Disp: 100 each, Rfl: 3;  LANTUS SOLOSTAR 100 UNIT/ML SOPN, , Disp: , Rfl:  levETIRAcetam (KEPPRA) 250 MG tablet, , Disp: , Rfl: ;   metFORMIN (GLUCOPHAGE) 500 MG tablet, Take 1 tablet (500 mg total) by mouth 2 (two) times daily with a meal., Disp: 60 tablet, Rfl: 0;  metFORMIN (GLUCOPHAGE) 500 MG tablet, , Disp: , Rfl: ;  metoprolol succinate (TOPROL-XL) 50 MG 24 hr tablet, Take 1 tablet by mouth 2 (two) times daily. Take 1 tablet twice daily, Disp: , Rfl:  oxybutynin (DITROPAN) 5 MG tablet, Take 1 tablet by mouth 2 (two) times daily. Take 1 tablet in the morning and 2 tablets in the evening, Disp: , Rfl: ;  potassium chloride (K-DUR) 10 MEQ tablet, Take 1 tablet by mouth daily.  Take 1 tablet once daily, Disp: , Rfl: ;  simvastatin (ZOCOR) 20 MG tablet, Take 1 tablet by mouth daily. Take 1 tablet once daily, Disp: , Rfl:  traMADol (ULTRAM) 50 MG tablet, Take 1 tablet by mouth 3 (three) times daily as needed for moderate pain. Take 1 tablet three times daily as needed for pain, Disp: , Rfl: ;   warfarin (COUMADIN) 5 MG tablet, Take 1 tablet on Sundays, Tuesday, Wednesday, Thursday, and Saturdays.  Take 1&1/2 tablets on Mondays and Fridays, Disp: 30 tablet, Rfl: 3  ROS: History obtained from the patient  General ROS: negative for - chills, fatigue, fever, night sweats, weight gain or weight loss Psychological ROS: negative for -  behavioral disorder, hallucinations, memory difficulties, mood swings or suicidal ideation Ophthalmic ROS: negative for - blurry vision, double vision, eye pain or loss of vision ENT ROS: negative for - epistaxis, nasal discharge, oral lesions, sore throat, tinnitus or vertigo Allergy and Immunology ROS: negative for - hives or itchy/watery eyes Hematological and Lymphatic ROS: negative for - bleeding problems, bruising or swollen lymph nodes Endocrine ROS: negative for - galactorrhea, hair pattern changes, polydipsia/polyuria or temperature intolerance Respiratory ROS: negative for - cough, hemoptysis, shortness of breath or wheezing Cardiovascular ROS: negative for - chest pain, dyspnea on exertion, edema or irregular heartbeat Gastrointestinal ROS: negative for - abdominal pain, diarrhea, hematemesis, nausea/vomiting or stool incontinence Genito-Urinary ROS: negative for - dysuria, hematuria, incontinence or urinary frequency/urgency Musculoskeletal ROS: negative for - joint swelling or muscular weakness Neurological ROS: as noted in HPI Dermatological ROS: negative for rash and skin lesion changes  Physical Examination: Blood pressure 147/76, pulse 100, temperature 97.4 F (36.3 C), temperature source Oral, resp. rate 23, height 5\' 8"   (1.727 m), weight 81.647 kg (180 lb), SpO2 96.00%.  Neurologic Examination: Mental Status: Alert, not oriented to time.  Speech fluent without evidence of aphasia.  Able to follow 3 step commands without difficulty. Cranial Nerves: II: Discs flat bilaterally; Patient unable to see in any visual field.  Unable to count fingers or see light. Pupils equal, round, reactive to light and accommodation III,IV, VI: ptosis not present, left gaze preference.  Unable to go beyond midline to the right.  Limited upward gaze.  Able to look downward.   V,VII: right facial droop, facial light touch sensation normal bilaterally VIII: hearing normal bilaterally IX,X: gag reflex present XI: bilateral shoulder shrug XII: midline tongue extension Motor: Right : Upper extremity   4/5    Left:     Upper extremity   5/5  Lower extremity   5-/5     Lower extremity   5/5 Tone and bulk:increased tone on the right Sensory: Pinprick and light touch intact throughout, bilaterally Deep Tendon Reflexes: 2+ and symmetric with absent AJ's bilaterally Plantars: Right: mute   Left: mute Cerebellar: No dysmetria noted using the LUE.  Heel to shin intact bilaterally Gait: Unable to test CV: pulses palpable throughout     Laboratory Studies:  Basic Metabolic Panel:  Recent Labs Lab 02/02/13 1845 02/02/13 1926  NA 139 140  K 5.3 5.2  CL 102 106  CO2 26  --   GLUCOSE 152* 152*  BUN 26* 27*  CREATININE 1.33* 1.40*  CALCIUM 9.8  --     Liver Function Tests:  Recent Labs Lab 02/02/13 1845  AST 22  ALT 11  ALKPHOS 93  BILITOT 0.4  PROT 7.9  ALBUMIN 3.7   No results found for this basename: LIPASE, AMYLASE,  in the last 168 hours No results found for this basename: AMMONIA,  in the last 168 hours  CBC:  Recent Labs Lab 02/02/13 1845 02/02/13 1926  WBC 8.8  --   NEUTROABS 6.1  --   HGB 14.1 15.6*  HCT 42.1 46.0  MCV 87.2  --   PLT 184  --     Cardiac Enzymes:  Recent Labs Lab  02/02/13 1845  TROPONINI <0.30    BNP: No components found with this basename: POCBNP,   CBG: No results found for this basename: GLUCAP,  in the last 168 hours  Microbiology: Results for orders placed during the hospital encounter of 06/18/11  URINE CULTURE  Status: None   Collection Time    06/18/11  9:27 PM      Result Value Range Status   Specimen Description URINE, CLEAN CATCH   Final   Special Requests NONE   Final   Culture  Setup Time [id number]   Final   Colony Count >=100,000 COLONIES/ML   Final   Culture ESCHERICHIA COLI   Final   Report Status 06/20/2011 FINAL   Final   Organism ID, Bacteria ESCHERICHIA COLI   Final  CULTURE, BLOOD (ROUTINE X 2)     Status: None   Collection Time    06/18/11 11:30 PM      Result Value Range Status   Specimen Description BLOOD RIGHT ARM   Final   Special Requests BOTTLES DRAWN AEROBIC AND ANAEROBIC 10CC EACH   Final   Culture  Setup Time [id number]   Final   Culture NO GROWTH 5 DAYS   Final   Report Status 06/25/2011 FINAL   Final  CULTURE, BLOOD (ROUTINE X 2)     Status: None   Collection Time    06/18/11 11:45 PM      Result Value Range Status   Specimen Description BLOOD RIGHT HAND   Final   Special Requests BOTTLES DRAWN AEROBIC ONLY 4CC   Final   Culture  Setup Time [id number]   Final   Culture NO GROWTH 5 DAYS   Final   Report Status 06/25/2011 FINAL   Final    Coagulation Studies:  Recent Labs  02/02/13 1845  LABPROT 12.7  INR 0.97    Urinalysis:  Recent Labs Lab 02/02/13 2008  COLORURINE YELLOW  LABSPEC 1.008  PHURINE 5.5  GLUCOSEU NEGATIVE  HGBUR NEGATIVE  BILIRUBINUR NEGATIVE  KETONESUR NEGATIVE  PROTEINUR NEGATIVE  UROBILINOGEN 0.2  NITRITE NEGATIVE  LEUKOCYTESUR NEGATIVE    Lipid Panel:    Component Value Date/Time   CHOL  Value: 208        ATP III CLASSIFICATION:  <200     mg/dL   Desirable  200-239  mg/dL   Borderline High  >=240    mg/dL   High       * 12/09/2008 0643   TRIG  146 12/09/2008 0643   HDL 53 12/09/2008 0643   CHOLHDL 3.9 12/09/2008 0643   VLDL 29 12/09/2008 0643   LDLCALC  Value: 126        Total Cholesterol/HDL:CHD Risk Coronary Heart Disease Risk Table                     Men   Women  1/2 Average Risk   3.4   3.3  Average Risk       5.0   4.4  2 X Average Risk   9.6   7.1  3 X Average Risk  23.4   11.0        Use the calculated Patient Ratio above and the CHD Risk Table to determine the patient's CHD Risk.        ATP III CLASSIFICATION (LDL):  <100     mg/dL   Optimal  100-129  mg/dL   Near or Above                    Optimal  130-159  mg/dL   Borderline  160-189  mg/dL   High  >190     mg/dL   Very High* 12/09/2008 0643    HgbA1C:  Lab Results  Component Value  Date   HGBA1C 6.8* 04/28/2012    Urine Drug Screen:     Component Value Date/Time   LABOPIA NONE DETECTED 02/02/2013 2008   COCAINSCRNUR NONE DETECTED 02/02/2013 2008   LABBENZ NONE DETECTED 02/02/2013 2008   AMPHETMU NONE DETECTED 02/02/2013 2008   THCU NONE DETECTED 02/02/2013 2008   LABBARB NONE DETECTED 02/02/2013 2008    Alcohol Level:  Recent Labs Lab 02/02/13 1845  ETH <11    Other results: EKG: atrial fibrillation at 86 bpm  Imaging: Ct Head Wo Contrast  02/02/2013   CLINICAL DATA:  Vision changes  EXAM: CT HEAD WITHOUT CONTRAST  TECHNIQUE: Contiguous axial images were obtained from the base of the skull through the vertex without intravenous contrast.  COMPARISON:  Prior MRI from 12/09/2008 and prior CT from 12/08/2008  FINDINGS: Wedge-shaped encephalomalacia within the left frontal lobe and right occipital lobe is seen, consistent with remote infarct. Remote left cerebral watershed infarct also present. Additional scattered and confluent hypodensity within the periventricular and deep white matter are most consistent with chronic microvascular ischemic change. No definite acute large vessel territory infarct. There is no acute intracranial hemorrhage.  No mass lesion or midline shift.  No hydrocephalus. There is no extra-axial fluid collection.  The calvarium is intact. Orbits are normal. Paranasal sinuses and mastoid air cells are clear.  IMPRESSION: 1. No acute intracranial abnormality. 2. Remote infarcts involving the left frontal and parietal lobes as well as the right occipital lobe. 3. Atrophy with chronic microvascular ischemic changes.   Electronically Signed   By: Jeannine Boga M.D.   On: 02/02/2013 19:26    Assessment: 73 y.o. female presenting with loss of vision.  History is poor.  Examination reveals cranial nerve abnormalities and a right hemiparesis.  It is unclear at this time which findings are new and which are chronic.  Concerned about a posterior circulation ischemic event.  Patient with multiple risk factors.  Head CT reviewed and shows evidence of remote infarcts but no acute infarcts or hemorrhage.  Further work up recommended.    Stroke Risk Factors - atrial fibrillation, diabetes mellitus, hyperlipidemia and hypertension  Plan: 1. HgbA1c, fasting lipid panel 2. MRI of the brain without contrast 3. PT consult, OT consult, Speech consult 4. Echocardiogram 5. Carotid dopplers 6. Prophylactic therapy-Would continue ASA and Coumadin at current doses.  A determination will need to be made as to whether the patient can continue on anticoagulant therapy.  Patient with cognitive deficits and vision abnormalities-she may very well be a fall risk. 7. Telemetry monitoring 8. Frequent neuro checks 9. CTA of the head and neck tonight  Case discussed with Dr. Samuel Bouche, MD [facility] [phone] 02/02/2013, 10:11 PM

## 2013-02-02 NOTE — ED Notes (Signed)
Dr. Reynolds neurologist at bedside. 

## 2013-02-02 NOTE — H&P (Signed)
[facility] History and Physical  Casey [name] [id number] DOB: 1940-08-14    PCP:   Blanchie Serve, MD   Chief Complaint: visual loss.  HPI: Casey [name] is an 72 y.o. female with hx of afib on Coumadin, though there were thought about switching her to Xarelto due to difficulty regulating her INR, hyperlipidemia, prior CVA, hx of CLL, PVD, gout, CHF, presents to the ER with visual loss.  She said she had a bad eye before, and thought it was the left eye, but she is quite a poor historian.  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  She has no complaint of focal weakness, palpitation, or shortness of breath.  Her work up in the ER included a head CT, which showed remote infarcts of the left frontal and parietal lobes, along with the right occipital lobe.  Her EKG showed afib with rate controlled, and no acute ST_T changes.  She has an unremarkable CBC, electrolytes, and a Cr of 1.4.  Neurology was consulted and hospitalist was asked to admit her for acute CVA.  Rewiew of Systems: I am unable to obtain a meaningful ROS, but she cannot see, and has no palpitation, HA, chest pain or SOB.    Past Medical History  Diagnosis Date  . Chronic atrial fibrillation   . Stroke   . Hypertension   . Hypercholesterolemia   . Diabetes mellitus   . Sinus of Valsalva aneurysm     a. By 2D echo 05/2011.  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  . CHF (congestive heart failure)   . GERD (gastroesophageal reflux disease)   . Seizures   . Urinary frequency   . Hypopotassemia   . Long term (current) use of anticoagulants   . Unspecified disorder of kidney and ureter   . Unspecified hereditary and idiopathic peripheral neuropathy   . Unspecified late effects of cerebrovascular disease   . Edema   . Peripheral vascular disease, unspecified   . Unspecified urinary incontinence   .  Congestive heart failure, unspecified   . Gout, unspecified   . Abdominal or pelvic swelling, mass, or lump, left upper quadrant   . Vascular dementia, uncomplicated   . Electrolyte and fluid disorders not elsewhere classified   . Hypopotassemia   . Long term (current) use of anticoagulants   . Chronic kidney disease   . Unspecified hereditary and idiopathic peripheral neuropathy   . Unspecified late effects of cerebrovascular disease   . Coronary atherosclerosis of unspecified type of vessel, native or graft   . Peripheral vascular disease, unspecified   . Gout, unspecified     Past Surgical History  Procedure Laterality Date  . Femoral to femoral bypass graft     . Femoral artery exploration  12/06/2011    Procedure: FEMORAL ARTERY EXPLORATION;  Surgeon: Angelia Mould, MD;  Location: [facility] OR;  Service: Vascular;  Laterality: N/A;  Exploration of large pseudoaneurysm left side of fem-fem bypass graft   . Femoral-femoral bypass graft  12/06/2011    Procedure: BYPASS GRAFT FEMORAL-FEMORAL ARTERY;  Surgeon: Angelia Mould, MD;  Location: [facility] OR;  Service: Vascular;  Laterality: Bilateral;  Revision of left to right Femoral-Femoral bypass graft  . False aneurysm repair  12/06/2011    Procedure: REPAIR FALSE ANEURYSM;  Surgeon: Angelia Mould, MD;  Location: [place];  Service: Vascular;  Laterality: Left;  Repair of left femoral Artery pseudoaneurysm  . Femoral-popliteal bypass graft Left 05/2002    lower extremity femoral to below knee w/ non-versed greater saphenous vein  . Femoral-popliteal bypass graft Left 11/2002  . Tubal ligation Bilateral   . Aortogram  09/01/2008    Abd w/ bilateral lower extremity runoff arteriography    Medications:  HOME MEDS: Prior to Admission medications   Medication Sig Start Date End Date Taking? Authorizing Provider  allopurinol (ZYLOPRIM) 100 MG tablet Take 1 tablet by mouth daily. Take 1 tablet once daily 05/31/12   Historical Provider,  MD  AMBULATORY NON FORMULARY MEDICATION BD Lancets Use as Directed to check blood sugar Dx: 250.00 11/25/12   Estill Dooms, MD  amLODipine (NORVASC) 5 MG tablet Take 1 tablet by mouth daily. Take 1 tablet once daily 06/01/12   Historical Provider, MD  aspirin 81 MG tablet Take 81 mg by mouth daily.    Historical Provider, MD  [place] 125 MCG tablet  07/31/12   Historical Provider, MD  donepezil (ARICEPT) 10 MG tablet Take 1 tablet by mouth at bedtime. Take 1 tablet at bedtime 05/12/12   Historical Provider, MD  furosemide (LASIX) 20 MG tablet Take 1 tablet by mouth daily. Take 1 tablet once daily 06/01/12   Historical Provider, MD  glucose blood (TRUETEST TEST) test strip Test blood sugar twice daily to control blood sugar. 250.00 10/14/12   Estill Dooms, MD  HYDROcodone-acetaminophen (NORCO) 10-325 MG per tablet Take 1 tablet by mouth every 6 (six) hours as needed for pain. Take one tablet by mouth every 6 hours as needed for severe pain 08/26/12   Blanchie Serve, MD  Insulin Syringe-Needle U-100 (BD INSULIN SYRINGE ULTRAFINE) 31G X 5/16" 0.3 ML MISC USE TO GIVE NOVOLOG AND LANTUS INSULIN 4 TIMES A DAY. 10/27/12   Pricilla Larsson, NP  LANTUS SOLOSTAR 100 UNIT/ML SOPN  07/21/12   Historical Provider, MD  levETIRAcetam (KEPPRA) 250 MG tablet  07/31/12   Historical Provider, MD  metFORMIN (GLUCOPHAGE) 500 MG tablet Take 1 tablet (500 mg total) by mouth 2 (two) times daily with a meal. 06/21/11 06/20/12  Jonetta Osgood, MD  metFORMIN (GLUCOPHAGE) 500 MG tablet  07/26/12   Historical Provider, MD  metoprolol succinate (TOPROL-XL) 50 MG 24 hr tablet Take 1 tablet by mouth 2 (two) times daily. Take 1 tablet twice daily 06/01/12   Historical Provider, MD  oxybutynin (DITROPAN) 5 MG tablet Take 1 tablet by mouth 2 (two) times daily. Take 1 tablet in the morning and 2 tablets in the evening 05/31/12   Historical Provider, MD  potassium chloride (K-DUR) 10 MEQ tablet Take 1 tablet by mouth daily. Take 1 tablet once daily  06/01/12   Historical Provider, MD  simvastatin (ZOCOR) 20 MG tablet Take 1 tablet by mouth daily. Take 1 tablet once daily 04/09/12   Historical Provider, MD  traMADol (ULTRAM) 50 MG tablet Take 1 tablet by mouth 3 (three) times daily as needed for moderate pain. Take 1 tablet three times daily as needed for pain 04/09/12   Historical Provider, MD  warfarin (COUMADIN) 5 MG tablet Take 1 tablet on Sundays, Tuesday, Wednesday, Thursday, and Saturdays.  Take 1&1/2 tablets on Mondays and Fridays 08/04/12   Tivis Ringer, RPH-CPP     Allergies:  Allergies  Allergen Reactions  . Penicillins Other (See Comments)    Patient states that she was told previously by a doctor to not take this medication but is unsure why.  Social History:   reports that she quit smoking about 12 years ago. Her smoking use included Cigarettes. She smoked 0.00 packs per day. She has never used smokeless tobacco. She reports that she does not drink alcohol or use illicit drugs.  Family History: Family History  Problem Relation Age of Onset  . Cancer Mother     Cervical  . Stroke Mother   . Diabetes Mother   . Stroke Father   . Heart disease Father      Physical Exam: Filed Vitals:   02/02/13 2115 02/02/13 2130 02/02/13 2145 02/02/13 2200  BP: 145/86 181/86 151/79 170/74  Pulse: 99 84 95 98  Temp:      TempSrc:      Resp: 21 19 26 24   Height:      Weight:      SpO2: 98% 98% 98% 98%   Blood pressure 170/74, pulse 98, temperature 97.4 F (36.3 C), temperature source Oral, resp. rate 24, height 5\' 8"  (1.727 m), weight 81.647 kg (180 lb), SpO2 98.00%.  GEN:  Pleasant  patient lying in the stretcher.  Looking toward the left, and unable to see. HEENT: Mucous membranes pink and anicteric; Her pupils are reactive to light, fundi difficult to see. EOM intact; no cervical lymphadenopathy nor thyromegaly or carotid bruit; no JVD; There were no stridor. Neck is very supple. Breasts:: Not examined CHEST WALL: No  tenderness CHEST: Normal respiration, clear to auscultation bilaterally.  HEART: Regular rate and rhythm.  There are no murmur, rub, or gallops.   BACK: No kyphosis or scoliosis; no CVA tenderness ABDOMEN: soft and non-tender; no masses, no organomegaly, normal abdominal bowel sounds; no pannus; no intertriginous candida. There is no rebound and no distention. Rectal Exam: Not done EXTREMITIES: No bone or joint deformity; age-appropriate arthropathy of the hands and knees; no edema; no ulcerations.  There is no calf tenderness. Genitalia: not examined PULSES: 2+ and symmetric SKIN: Normal hydration no rash or ulceration CNS: She does respond to bright lights in both eyes.  Speech is fluent; uvula elevated with phonation, facial symmetry and tongue midline. Her right side is slightly weaker than her left side.  Labs on Admission:  Basic Metabolic Panel:  Recent Labs Lab 02/02/13 1845 02/02/13 1926  NA 139 140  K 5.3 5.2  CL 102 106  CO2 26  --   GLUCOSE 152* 152*  BUN 26* 27*  CREATININE 1.33* 1.40*  CALCIUM 9.8  --    Liver Function Tests:  Recent Labs Lab 02/02/13 1845  AST 22  ALT 11  ALKPHOS 93  BILITOT 0.4  PROT 7.9  ALBUMIN 3.7   No results found for this basename: LIPASE, AMYLASE,  in the last 168 hours No results found for this basename: AMMONIA,  in the last 168 hours CBC:  Recent Labs Lab 02/02/13 1845 02/02/13 1926  WBC 8.8  --   NEUTROABS 6.1  --   HGB 14.1 15.6*  HCT 42.1 46.0  MCV 87.2  --   PLT 184  --    Cardiac Enzymes:  Recent Labs Lab 02/02/13 1845  TROPONINI <0.30    CBG: No results found for this basename: GLUCAP,  in the last 168 hours   Radiological Exams on Admission: Ct Head Wo Contrast  02/02/2013   CLINICAL DATA:  Vision changes  EXAM: CT HEAD WITHOUT CONTRAST  TECHNIQUE: Contiguous axial images were obtained from the base of the skull through the vertex without intravenous contrast.  COMPARISON:  Prior  MRI from 12/09/2008  and prior CT from 12/08/2008  FINDINGS: Wedge-shaped encephalomalacia within the left frontal lobe and right occipital lobe is seen, consistent with remote infarct. Remote left cerebral watershed infarct also present. Additional scattered and confluent hypodensity within the periventricular and deep white matter are most consistent with chronic microvascular ischemic change. No definite acute large vessel territory infarct. There is no acute intracranial hemorrhage.  No mass lesion or midline shift. No hydrocephalus. There is no extra-axial fluid collection.  The calvarium is intact. Orbits are normal. Paranasal sinuses and mastoid air cells are clear.  IMPRESSION: 1. No acute intracranial abnormality. 2. Remote infarcts involving the left frontal and parietal lobes as well as the right occipital lobe. 3. Atrophy with chronic microvascular ischemic changes.   Electronically Signed   By: Jeannine Boga M.D.   On: 02/02/2013 19:26    EKG: Independently reviewed. Afib, rate controlled.  No acute ST T changes.   Assessment/Plan Present on Admission:  . Stroke . Type 2 diabetes mellitus with diabetic neuropathy . Hypertension . Hyperlipidemia . Hypercholesterolemia . HTN (hypertension), benign . Chronic atrial fibrillation . AKI (acute kidney injury) . Alzheimer's disease . CVA (cerebral infarction)  PLAN:  She likely has another CVA, though her history is difficult to obtain, and I am unsure what is new finding for her.  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  I spoke with Dr Tyron Russell, and she recommended to hold off on Xarelto, as she may not be a candidate for full anticoagulation at all, given her current condition.  Will continue her coumadin and ASA for now, to see how she will do.  For her other problems, including HTN, hyperlipidemia and her DM2, they are stable.  Will continue her meds.  She is a full code, and will be admitted to [facility] service for her stroke work up.  Her  progress note had stated that her memory has gotten worse, so her history is not reliable.  Thank you for allowing me to participate in her care.  Other plans as per orders.  Code Status: FULL Haskel Khan, MD. [facility] Pager [phone] 7pm to 7am.  02/02/2013, 10:41 PM

## 2013-02-02 NOTE — ED Provider Notes (Signed)
CSN: [id number]     Arrival date & time 02/02/13  1834 History   First MD Initiated Contact with Patient 02/02/13 1844     Chief Complaint  Patient presents with  . Altered Mental Status   (Consider location/radiation/quality/duration/timing/severity/associated sxs/prior Treatment) Patient is a 73 y.o. female presenting with eye problem. The history is provided by the EMS personnel and the patient. The history is limited by the condition of the patient.  Eye Problem Location:  Both Quality: loss of vision. Severity:  Severe Onset quality:  Sudden Duration:  1 day Timing:  Constant Chronicity:  Recurrent Relieved by:  Nothing Associated symptoms: decreased vision   Associated symptoms: no headaches, no nausea, no numbness and no vomiting     Past Medical History  Diagnosis Date  . Chronic atrial fibrillation   . Stroke   . Hypertension   . Hypercholesterolemia   . Diabetes mellitus   . Sinus of Valsalva aneurysm     a. By 2D echo 05/2011.  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  . CHF (congestive heart failure)   . GERD (gastroesophageal reflux disease)   . Seizures   . Urinary frequency   . Hypopotassemia   . Long term (current) use of anticoagulants   . Unspecified disorder of kidney and ureter   . Unspecified hereditary and idiopathic peripheral neuropathy   . Unspecified late effects of cerebrovascular disease   . Edema   . Peripheral vascular disease, unspecified   . Unspecified urinary incontinence   . Congestive heart failure, unspecified   . Gout, unspecified   . Abdominal or pelvic swelling, mass, or lump, left upper quadrant   . Vascular dementia, uncomplicated   . Electrolyte and fluid disorders not elsewhere classified   . Hypopotassemia   . Long term (current) use of anticoagulants   . Chronic kidney disease   . Unspecified  hereditary and idiopathic peripheral neuropathy   . Unspecified late effects of cerebrovascular disease   . Coronary atherosclerosis of unspecified type of vessel, native or graft   . Peripheral vascular disease, unspecified   . Gout, unspecified    Past Surgical History  Procedure Laterality Date  . Femoral to femoral bypass graft     . Femoral artery exploration  12/06/2011    Procedure: FEMORAL ARTERY EXPLORATION;  Surgeon: Angelia Mould, MD;  Location: [facility] OR;  Service: Vascular;  Laterality: N/A;  Exploration of large pseudoaneurysm left side of fem-fem bypass graft   . Femoral-femoral bypass graft  12/06/2011    Procedure: BYPASS GRAFT FEMORAL-FEMORAL ARTERY;  Surgeon: Angelia Mould, MD;  Location: [facility] OR;  Service: Vascular;  Laterality: Bilateral;  Revision of left to right Femoral-Femoral bypass graft  . False aneurysm repair  12/06/2011    Procedure: REPAIR FALSE ANEURYSM;  Surgeon: Angelia Mould, MD;  Location: [place];  Service: Vascular;  Laterality: Left;  Repair of left femoral Artery pseudoaneurysm  . Femoral-popliteal bypass graft Left 05/2002    lower extremity femoral to below knee w/ non-versed greater saphenous vein  . Femoral-popliteal bypass graft Left 11/2002  . Tubal ligation Bilateral   . Aortogram  09/01/2008    Abd w/ bilateral lower extremity runoff arteriography   Family History  Problem Relation Age of Onset  . Cancer Mother     Cervical  . Stroke Mother   . Diabetes Mother   .  Stroke Father   . Heart disease Father    History  Substance Use Topics  . Smoking status: Former Smoker    Types: Cigarettes    Quit date: 01/29/2001  . Smokeless tobacco: Never Used  . Alcohol Use: No   OB History   Grav Para Term Preterm Abortions TAB SAB Ect Mult Living                 Review of Systems  Constitutional: Negative for fever and chills.  HENT: Negative for sore throat.   Eyes: Positive for visual disturbance. Negative for pain.   Respiratory: Negative for cough and shortness of breath.   Cardiovascular: Negative for chest pain.  Gastrointestinal: Negative for nausea, vomiting, abdominal pain and diarrhea.  Genitourinary: Negative for dysuria.  Musculoskeletal: Negative for back pain.  Skin: Negative for rash.  Neurological: Negative for numbness and headaches.    Allergies  Penicillins  Home Medications   No current outpatient prescriptions on file. BP 157/104  Pulse 79  Temp(Src) 97.5 F (36.4 C) (Oral)  Resp 20  Ht 5\' 5"  (1.651 m)  Wt 160 lb 4.8 oz (72.712 kg)  BMI 26.68 kg/m2  SpO2 100% Physical Exam  Constitutional: She is oriented to person, place, and time. She appears well-developed and well-nourished. No distress.  HENT:  Head: Normocephalic and atraumatic.  Eyes: Pupils are equal, round, and reactive to light. Right eye exhibits no discharge. Left eye exhibits no discharge. Right conjunctiva is not injected. Right conjunctiva has no hemorrhage. Left conjunctiva is not injected. Left conjunctiva has no hemorrhage. Right eye exhibits abnormal extraocular motion. Left eye exhibits abnormal extraocular motion. Right pupil is round and reactive. Left pupil is round and reactive.  EOM does not cross midline to the right.   Neck: Normal range of motion.  Cardiovascular: Normal rate and normal heart sounds.  An irregularly irregular rhythm present.  Pulmonary/Chest: Effort normal and breath sounds normal.  Abdominal: Soft. She exhibits no distension. There is no tenderness.  Musculoskeletal: Normal range of motion.  Neurological: She is alert and oriented to person, place, and time. A cranial nerve deficit (EOM deficit) is present. No sensory deficit. She exhibits abnormal muscle tone (weakness in all extremities, baseline per patient). GCS eye subscore is 4. GCS verbal subscore is 5. GCS motor subscore is 6.  Skin: Skin is warm. She is not diaphoretic.    ED Course  Procedures (including critical  care time) Labs Review Labs Reviewed  COMPREHENSIVE METABOLIC PANEL - Abnormal; Notable for the following:    Glucose, Bld 152 (*)    BUN 26 (*)    Creatinine, Ser 1.33 (*)    GFR calc non Af Amer 39 (*)    GFR calc Af Amer 45 (*)    All other components within normal limits  POCT I-STAT, CHEM 8 - Abnormal; Notable for the following:    BUN 27 (*)    Creatinine, Ser 1.40 (*)    Glucose, Bld 152 (*)    Hemoglobin 15.6 (*)    All other components within normal limits  ETHANOL  PROTIME-INR  APTT  CBC  DIFFERENTIAL  TROPONIN I  URINE RAPID DRUG SCREEN (HOSP PERFORMED)  URINALYSIS, ROUTINE W REFLEX MICROSCOPIC  SEDIMENTATION RATE  C-REACTIVE PROTEIN  HEMOGLOBIN A1C  LIPID PANEL  URINE RAPID DRUG SCREEN (HOSP PERFORMED)  POCT I-STAT TROPONIN I   Imaging Review Ct Angio Head W/cm &/or Wo Cm  02/02/2013   CLINICAL DATA:  Vision changes.  Rule out straw  EXAM: CT ANGIOGRAPHY HEAD AND NECK  TECHNIQUE: Multidetector CT imaging of the head and neck was performed using the standard protocol during bolus administration of intravenous contrast. Multiplanar CT image reconstructions including MIPs were obtained to evaluate the vascular anatomy. Carotid stenosis measurements (when applicable) are obtained utilizing NASCET criteria, using the distal internal carotid diameter as the denominator.  CONTRAST:  68mL OMNIPAQUE IOHEXOL 350 MG/ML SOLN  COMPARISON:  Head CT from the same day. Brain MRI and MRA 12/09/2008  FINDINGS: CTA HEAD FINDINGS  Posterior circulation: Most notable for abrupt vessel cut off of the distal left P2. Codominant vertebral arteries. No aneurysm or significant stenosis.  Right anterior circulation: Extensive carotid siphon atherosclerosis. No evidence of aneurysm or significant stenosis. No major vessel cut off.  Left anterior circulation: Chronic occlusion of the left ICA to the level of the supraclinoid carotid, were there is reconstitution via the posterior communicating artery.  Left A1 is hypoplastic. The left A2 is chronically attenuated. No high-grade stenosis or major vessel occlusion seen more distally.  Remote infarcts in the left frontal lobe and right parasagittal lobe. It is difficult to clearly see infarct in the parasagittal left occipital lobe, although there is likely acute ischemia given the history and angiographic findings. There is also subtle gray-white differentiation loss in this region. No abnormal intracranial enhancement. Patent dural venous sinuses.  Review of the MIP images confirms the above findings.  CTA NECK FINDINGS  Intrathoracic imaging notable for mediastinal and bilateral hilar lymphadenopathy. From chest CT 10/12/2002 report, this is a chronic finding. There is an irregularly marginated 7 mm nodule in the right upper lobe. Apical emphysema.  Two vessel aortic arch. Bilateral vertebral arteries show mild luminal irregularity and mural calcifications, consistent with atherosclerosis. No significant stenosis.  There is extensive calcified and noncalcified plaque throughout the right carotid system, including the common carotid artery. There is kinking of the right internal carotid artery approximate 2 cm beyond the bifurcation, with kink and atherosclerosis causing 50-60% stenosis. No evidence of ulcerated plaque or dissection.  There is extensive atherosclerosis throughout the left common carotid artery. Chronic occlusion of the left ICA just beyond the bifurcation. The ICA is occluded to the supraclinoid segment, were there is reconstitution as noted below. This finding is chronic based on 2010 MRA.  Critical results were called by telephone at the time of interpretation on 02/02/2013 at 10:59 PM to Dr. Alexis Goodell , who verbally acknowledged these results.  Review of the MIP images confirms the above findings.  IMPRESSION: 1. Distal left P2 occlusion. No definite occipital infarct by CT, MRI MRI could confirm the presence of infarction. 2. Chronic left  ICA occlusion with reconstitution via the left posterior communicating artery. 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 3. 50-60% stenosis of the cervical right ICA secondary to atherosclerotic plaque and vessel kinking. 4. 7 mm right upper lobe pulmonary nodule. Recommend chest CT followup in 3 months. 5. Mediastinal and hilar lymphadenopathy, chronic based on reporting from 2004. Is there history of sarcoidosis?   Electronically Signed   By: Jorje Guild M.D.   On: 02/02/2013 23:18   Ct Head Wo Contrast  02/02/2013   CLINICAL DATA:  Vision changes  EXAM: CT HEAD WITHOUT CONTRAST  TECHNIQUE: Contiguous axial images were obtained from the base of the skull through the vertex without intravenous contrast.  COMPARISON:  Prior MRI from 12/09/2008 and prior CT from 12/08/2008  FINDINGS: Wedge-shaped encephalomalacia within the left frontal lobe and right  occipital lobe is seen, consistent with remote infarct. Remote left cerebral watershed infarct also present. Additional scattered and confluent hypodensity within the periventricular and deep white matter are most consistent with chronic microvascular ischemic change. No definite acute large vessel territory infarct. There is no acute intracranial hemorrhage.  No mass lesion or midline shift. No hydrocephalus. There is no extra-axial fluid collection.  The calvarium is intact. Orbits are normal. Paranasal sinuses and mastoid air cells are clear.  IMPRESSION: 1. No acute intracranial abnormality. 2. Remote infarcts involving the left frontal and parietal lobes as well as the right occipital lobe. 3. Atrophy with chronic microvascular ischemic changes.   Electronically Signed   By: Jeannine Boga M.D.   On: 02/02/2013 19:26   Ct Angio Neck W/cm &/or Wo/cm  02/02/2013   CLINICAL DATA:  Vision changes.  Rule out straw  EXAM: CT ANGIOGRAPHY HEAD AND NECK  TECHNIQUE: Multidetector CT imaging of the head and neck was performed using the standard protocol during bolus  administration of intravenous contrast. Multiplanar CT image reconstructions including MIPs were obtained to evaluate the vascular anatomy. Carotid stenosis measurements (when applicable) are obtained utilizing NASCET criteria, using the distal internal carotid diameter as the denominator.  CONTRAST:  18mL OMNIPAQUE IOHEXOL 350 MG/ML SOLN  COMPARISON:  Head CT from the same day. Brain MRI and MRA 12/09/2008  FINDINGS: CTA HEAD FINDINGS  Posterior circulation: Most notable for abrupt vessel cut off of the distal left P2. Codominant vertebral arteries. No aneurysm or significant stenosis.  Right anterior circulation: Extensive carotid siphon atherosclerosis. No evidence of aneurysm or significant stenosis. No major vessel cut off.  Left anterior circulation: Chronic occlusion of the left ICA to the level of the supraclinoid carotid, were there is reconstitution via the posterior communicating artery. Left A1 is hypoplastic. The left A2 is chronically attenuated. No high-grade stenosis or major vessel occlusion seen more distally.  Remote infarcts in the left frontal lobe and right parasagittal lobe. It is difficult to clearly see infarct in the parasagittal left occipital lobe, although there is likely acute ischemia given the history and angiographic findings. There is also subtle gray-white differentiation loss in this region. No abnormal intracranial enhancement. Patent dural venous sinuses.  Review of the MIP images confirms the above findings.  CTA NECK FINDINGS  Intrathoracic imaging notable for mediastinal and bilateral hilar lymphadenopathy. From chest CT 10/12/2002 report, this is a chronic finding. There is an irregularly marginated 7 mm nodule in the right upper lobe. Apical emphysema.  Two vessel aortic arch. Bilateral vertebral arteries show mild luminal irregularity and mural calcifications, consistent with atherosclerosis. No significant stenosis.  There is extensive calcified and noncalcified plaque  throughout the right carotid system, including the common carotid artery. There is kinking of the right internal carotid artery approximate 2 cm beyond the bifurcation, with kink and atherosclerosis causing 50-60% stenosis. No evidence of ulcerated plaque or dissection.  There is extensive atherosclerosis throughout the left common carotid artery. Chronic occlusion of the left ICA just beyond the bifurcation. The ICA is occluded to the supraclinoid segment, were there is reconstitution as noted below. This finding is chronic based on 2010 MRA.  Critical results were called by telephone at the time of interpretation on 02/02/2013 at 10:59 PM to Dr. Alexis Goodell , who verbally acknowledged these results.  Review of the MIP images confirms the above findings.  IMPRESSION: 1. Distal left P2 occlusion. No definite occipital infarct by CT, MRI MRI could confirm the presence of  infarction. 2. 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 3. 50-60% stenosis of the cervical right ICA secondary to atherosclerotic plaque and vessel kinking. 4. 7 mm right upper lobe pulmonary nodule. Recommend chest CT followup in 3 months. 5. Mediastinal and hilar lymphadenopathy, chronic based on reporting from 2004. Is there history of sarcoidosis?   Electronically Signed   By: Jorje Guild M.D.   On: 02/02/2013 23:18   EKG Interpretation    Date/Time:  Monday February 02 2013 18:49:32 EST Ventricular Rate:  86 PR Interval:    QRS Duration: 111 QT Interval:  389 QTC Calculation: 465 R Axis:   -65 Text Interpretation:  Atrial fibrillation Left anterior fascicular block Anteroseptal infarct, age indeterminate Lateral leads are also involved Similar to prior with lateral ST inversions Confirmed by Mingo Amber  MD, Arnold ([id number]) on 02/02/2013 7:06:27 PM           MDM   1. Vision loss   2. A-fib   3. AKI (acute kidney injury)   4. Alzheimer's disease    5. Current use of long term anticoagulation   6. 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 Stroke    73 yo F with hx of prior strokes presents with vision loss. Patient unreliable historian, but per EMS, patient last seen normal 3 days ago. Patient states her visual symptoms have been ongoing for approximately 1 day. Patient with intermittent headaches as well.   Patient with reactive pupils. Patient with history of possible vision changes in the past. Patient with evidence of prior occipital stroke in the past. Concern for contralateral new involvement at this time. Neurology consulted, recommended telemetry admission. Will admit to hospitalist.   Patient admitted to floor in stable condition under hospitalist service. Patient seen and evaluated by myself and my attending, Dr. Mingo Amber.      Freddi Che, MD 02/03/13 [phone]

## 2013-02-03 ENCOUNTER — Observation Stay (HOSPITAL_COMMUNITY): Payer: Medicare Other

## 2013-02-03 DIAGNOSIS — I70219 Atherosclerosis of native arteries of extremities with intermittent claudication, unspecified extremity: Secondary | ICD-10-CM

## 2013-02-03 DIAGNOSIS — I10 Essential (primary) hypertension: Secondary | ICD-10-CM

## 2013-02-03 DIAGNOSIS — E7800 Pure hypercholesterolemia, unspecified: Secondary | ICD-10-CM

## 2013-02-03 DIAGNOSIS — I059 Rheumatic mitral valve disease, unspecified: Secondary | ICD-10-CM

## 2013-02-03 LAB — SEDIMENTATION RATE: Sed Rate: 3 mm/hr (ref 0–22)

## 2013-02-03 LAB — LIPID PANEL
CHOLESTEROL: 192 mg/dL (ref 0–200)
HDL: 72 mg/dL (ref >39)
LDL Cholesterol: 89 mg/dL (ref 0–99)
Total CHOL/HDL Ratio: 2.7 RATIO
Triglycerides: 156 mg/dL — ABNORMAL HIGH (ref <150)
VLDL: 31 mg/dL (ref 0–40)

## 2013-02-03 LAB — PROTIME-INR
INR: 1.03 (ref 0.00–1.49)
Prothrombin Time: 13.3 seconds (ref 11.6–15.2)

## 2013-02-03 LAB — GLUCOSE, CAPILLARY
GLUCOSE-CAPILLARY: 140 mg/dL — AB (ref 70–99)
GLUCOSE-CAPILLARY: 128 mg/dL — AB (ref 70–99)
GLUCOSE-CAPILLARY: 122 mg/dL — AB (ref 70–99)
Glucose-Capillary: 187 mg/dL — ABNORMAL HIGH (ref 70–99)

## 2013-02-03 LAB — C-REACTIVE PROTEIN

## 2013-02-03 LAB — HEMOGLOBIN A1C
Hgb A1c MFr Bld: 7 % — ABNORMAL HIGH (ref <5.7)
Mean Plasma Glucose: 154 mg/dL — ABNORMAL HIGH (ref <117)

## 2013-02-03 MED ORDER — METOPROLOL SUCCINATE ER 50 MG PO TB24
50.0000 mg | ORAL_TABLET | Freq: Two times a day (BID) | ORAL | Status: DC
  Administered 2013-02-03 – 2013-02-07 (×9): 50 mg via ORAL
  Filled 2013-02-03 (×11): qty 1

## 2013-02-03 MED ORDER — WARFARIN SODIUM 7.5 MG PO TABS
7.5000 mg | ORAL_TABLET | ORAL | Status: AC
  Administered 2013-02-03: 7.5 mg via ORAL
  Filled 2013-02-03: qty 1

## 2013-02-03 MED ORDER — FUROSEMIDE 20 MG PO TABS
20.0000 mg | ORAL_TABLET | Freq: Every day | ORAL | Status: DC
  Administered 2013-02-05 – 2013-02-07 (×3): 20 mg via ORAL
  Filled 2013-02-03 (×3): qty 1

## 2013-02-03 MED ORDER — OXYBUTYNIN CHLORIDE 5 MG PO TABS
5.0000 mg | ORAL_TABLET | Freq: Two times a day (BID) | ORAL | Status: DC
  Administered 2013-02-03 – 2013-02-07 (×10): 5 mg via ORAL
  Filled 2013-02-03 (×11): qty 1

## 2013-02-03 MED ORDER — DIGOXIN 125 MCG PO TABS
0.1250 mg | ORAL_TABLET | Freq: Every day | ORAL | Status: DC
  Administered 2013-02-03 – 2013-02-07 (×5): 0.125 mg via ORAL
  Filled 2013-02-03 (×5): qty 1

## 2013-02-03 MED ORDER — INSULIN ASPART 100 UNIT/ML ~~LOC~~ SOLN: 0.0000 [IU] | SUBCUTANEOUS | Status: DC

## 2013-02-03 MED ORDER — ALLOPURINOL 100 MG PO TABS
100.0000 mg | ORAL_TABLET | Freq: Every day | ORAL | Status: DC
  Administered 2013-02-03 – 2013-02-07 (×5): 100 mg via ORAL
  Filled 2013-02-03 (×5): qty 1

## 2013-02-03 MED ORDER — POTASSIUM CHLORIDE ER 10 MEQ PO TBCR
10.0000 meq | EXTENDED_RELEASE_TABLET | Freq: Every day | ORAL | Status: DC
  Administered 2013-02-03 – 2013-02-07 (×5): 10 meq via ORAL
  Filled 2013-02-03 (×5): qty 1

## 2013-02-03 MED ORDER — TRAMADOL HCL 50 MG PO TABS: 50.0000 mg | ORAL_TABLET | Freq: Three times a day (TID) | ORAL | Status: DC | PRN

## 2013-02-03 MED ORDER — WARFARIN - PHARMACIST DOSING INPATIENT: Freq: Every day | Status: DC

## 2013-02-03 MED ORDER — SIMVASTATIN 20 MG PO TABS
20.0000 mg | ORAL_TABLET | Freq: Every day | ORAL | Status: DC
  Administered 2013-02-03 – 2013-02-07 (×5): 20 mg via ORAL
  Filled 2013-02-03 (×5): qty 1

## 2013-02-03 MED ORDER — LEVETIRACETAM 250 MG PO TABS
250.0000 mg | ORAL_TABLET | Freq: Two times a day (BID) | ORAL | Status: DC
  Administered 2013-02-03 – 2013-02-07 (×10): 250 mg via ORAL
  Filled 2013-02-03 (×11): qty 1

## 2013-02-03 MED ORDER — WARFARIN SODIUM 7.5 MG PO TABS
7.5000 mg | ORAL_TABLET | Freq: Once | ORAL | Status: AC
  Administered 2013-02-03: 7.5 mg via ORAL
  Filled 2013-02-03: qty 1

## 2013-02-03 MED ORDER — INSULIN ASPART 100 UNIT/ML ~~LOC~~ SOLN
0.0000 [IU] | Freq: Three times a day (TID) | SUBCUTANEOUS | Status: DC
  Administered 2013-02-03 (×2): 1 [IU] via SUBCUTANEOUS
  Administered 2013-02-04: 2 [IU] via SUBCUTANEOUS
  Administered 2013-02-04: 1 [IU] via SUBCUTANEOUS
  Administered 2013-02-05: 2 [IU] via SUBCUTANEOUS
  Administered 2013-02-05 – 2013-02-06 (×5): 1 [IU] via SUBCUTANEOUS
  Administered 2013-02-07: 2 [IU] via SUBCUTANEOUS
  Administered 2013-02-07: 1 [IU] via SUBCUTANEOUS

## 2013-02-03 MED ORDER — DONEPEZIL HCL 10 MG PO TABS
10.0000 mg | ORAL_TABLET | Freq: Every day | ORAL | Status: DC
  Administered 2013-02-03 – 2013-02-06 (×5): 10 mg via ORAL
  Filled 2013-02-03 (×6): qty 1

## 2013-02-03 MED ORDER — ASPIRIN 81 MG PO CHEW
81.0000 mg | CHEWABLE_TABLET | Freq: Every day | ORAL | Status: DC
  Administered 2013-02-03 – 2013-02-07 (×5): 81 mg via ORAL
  Filled 2013-02-03 (×8): qty 1

## 2013-02-03 MED ORDER — FUROSEMIDE 20 MG PO TABS
20.0000 mg | ORAL_TABLET | Freq: Every day | ORAL | Status: DC
  Filled 2013-02-03: qty 1

## 2013-02-03 MED ORDER — AMLODIPINE BESYLATE 5 MG PO TABS
5.0000 mg | ORAL_TABLET | Freq: Every day | ORAL | Status: DC
  Administered 2013-02-03 – 2013-02-07 (×5): 5 mg via ORAL
  Filled 2013-02-03 (×5): qty 1

## 2013-02-03 MED ORDER — SODIUM CHLORIDE 0.9 % IV SOLN
INTRAVENOUS | Status: DC
  Administered 2013-02-03: 11:00:00 via INTRAVENOUS

## 2013-02-03 NOTE — Progress Notes (Signed)
UR completed 

## 2013-02-03 NOTE — Plan of Care (Signed)
Problem: Consults Goal: Ischemic Stroke Patient Education See Patient Education Module for education specifics. Outcome: Not Progressing Unable to educate this patient related to current cognitive deficits. Baseline dementia with very poor recall.  Plan to educate with family/caregiver presence.

## 2013-02-03 NOTE — Evaluation (Signed)
Occupational Therapy Evaluation Patient Details Name: GEORGENE KOPPER MRN: [id number] DOB: 1940-02-15 Today's Date: 02/03/2013 Time: 1157-2620 OT Time Calculation (min): 25 min  OT Assessment / Plan / Recommendation History of present illness 73 y.o. female admitted to [facility] on 02/02/13 with hx of afib on Coumadin, though there were thought about switching her to Xarelto due to difficulty regulating her INR, hyperlipidemia, prior CVA, hx of CLL, PVD, gout, CHF, presents to the ER with visual loss.  She said she had a bad eye before, and thought it was the left eye, but she is quite a poor historian.  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  She has no complaint of focal weakness, palpitation, or shortness of breath.  Her work up in the ER included a head CT, which showed remote infarcts of the left frontal and parietal lobes, along with the right occipital lobe.  Her EKG showed afib with rate controlled, and no acute ST_T changes.  She has an unremarkable CBC, electrolytes, and a Cr of 1.4.  Neurology was consulted and hospitalist was asked to admit her for acute CVA.   Clinical Impression   Pt presents with below problem list. Feel pt will benefit from acute OT to increase independence prior to d/c. Recommending SNF for additional rehab. Pt appears to have significant visual impairments, but seems to be able to see something at times.   OT Assessment  Patient needs continued OT Services    Follow Up Recommendations  SNF;Supervision/Assistance - 24 hour    Barriers to Discharge      Equipment Recommendations  Other (comment) (defer to next venue)    Recommendations for Other Services    Frequency  Min 2X/week    Precautions / Restrictions Precautions Precautions: Fall Precaution Comments: due to visual deficits and right sided weakness (upper extremity)   Pertinent Vitals/Pain No pain reported.     ADL  Grooming: Wash/dry face;Teeth care;Minimal assistance Where Assessed - Grooming: Supported  sitting Upper Body Dressing: Moderate assistance Where Assessed - Upper Body Dressing: Unsupported sitting Lower Body Dressing: Moderate assistance Where Assessed - Lower Body Dressing: Supported sit to Lobbyist: Minimal assistance Toilet Transfer Method: Sit to stand;Stand pivot Science writer: Bedside commode Toileting - Clothing Manipulation and Hygiene: Minimal assistance Where Assessed - Best boy and Hygiene: Sit to stand from 3-in-1 or toilet Tub/Shower Transfer Method: Not assessed Equipment Used: Gait belt Transfers/Ambulation Related to ADLs: Min A ADL Comments: Educated on dressing technique. Pt stood and performed hygiene after toileting and also wiped off legs with washcloth while sitting on bed due to pt being incontinent of urine in bed.    OT Diagnosis: Generalized weakness;Disturbance of vision;Altered mental status  OT Problem List: Impaired vision/perception;Impaired balance (sitting and/or standing);Decreased activity tolerance;Decreased strength;Decreased cognition;Decreased safety awareness;Decreased knowledge of use of DME or AE;Decreased knowledge of precautions;Impaired UE functional use;Impaired sensation OT Treatment Interventions: Self-care/ADL training;Therapeutic exercise;Neuromuscular education;DME and/or AE instruction;Therapeutic activities;Cognitive remediation/compensation;Visual/perceptual remediation/compensation;Patient/family education;Balance training   OT Goals(Current goals can be found in the care plan section) Acute Rehab OT Goals Patient Stated Goal: not stated OT Goal Formulation:  (pt with decreased cognition) Time For Goal Achievement: 02/10/13 Potential to Achieve Goals: Good ADL Goals Pt Will Perform Upper Body Dressing: with supervision;sitting Pt Will Perform Lower Body Dressing: with supervision;sit to/from stand Pt Will Transfer to Toilet: with supervision;ambulating;bedside commode Pt  Will Perform Toileting - Clothing Manipulation and hygiene: with supervision;sit to/from stand  Visit Information  Last OT Received On: 02/03/13 Assistance  Needed: +1 History of Present Illness: 73 y.o. female admitted to [facility] on 02/02/13 with hx of afib on Coumadin, though there were thought about switching her to Xarelto due to difficulty regulating her INR, hyperlipidemia, prior CVA, hx of CLL, PVD, gout, CHF, presents to the ER with visual loss.  She said she had a bad eye before, and thought it was the left eye, but she is quite a poor historian.  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  She has no complaint of focal weakness, palpitation, or shortness of breath.  Her work up in the ER included a head CT, which showed remote infarcts of the left frontal and parietal lobes, along with the right occipital lobe.  Her EKG showed afib with rate controlled, and no acute ST_T changes.  She has an unremarkable CBC, electrolytes, and a Cr of 1.4.  Neurology was consulted and hospitalist was asked to admit her for acute CVA.       Prior [place] expects to be discharged to:: Private residence Living Arrangements: Alone Type of Home: Apartment Home Access: Stairs to enter [employer] of Steps: 1 Entrance Stairs-Rails: None Home Layout: One level Home Equipment: Cane - single point;Shower seat Additional Comments: unsure of accuracy of information; told OT she lived in apartment and PT mobile home/senior center. Prior Function Level of Independence:  (not sure) Comments: Per PT, pt reports she uses a cane outside of the home, does not drive, gets rides from friends, again poor historian an no family here to confirm any info given.   Communication Communication: No difficulties Dominant Hand: Left         Vision/Perception Vision - History Patient Visual Report:  (difficulty seeing-pt having hard time describing)   Cognition  Cognition Arousal/Alertness:  Awake/alert Behavior During Therapy: WFL for tasks assessed/performed Overall Cognitive Status: Impaired/Different from baseline Area of Impairment: Orientation;Memory Orientation Level: Disoriented to;Time;Situation Memory: Decreased short-term memory (could not recall birthday independently)     Extremity/Trunk Assessment Upper Extremity Assessment Upper Extremity Assessment: RUE deficits/detail RUE Deficits / Details: Pt with less than 90 degrees AROM shoulder flexion; minimal finger movement. RUE Sensation: decreased light touch Lower Extremity Assessment Lower Extremity Assessment: Defer to PT evaluation Cervical / Trunk Assessment Cervical / Trunk Assessment: Normal     Mobility Bed Mobility Bed Mobility: Supine to Sit;Sit to Supine;Scooting to HOB Supine to Sit: 4: Min assist Sitting - Scoot to Edge of Bed: 4: Min assist Sit to Supine: 4: Min guard Scooting to [facility]: 4: Min assist Details for Bed Mobility Assistance: Cues for technique. Transfers Transfers: Sit to Stand;Stand to Sit Sit to Stand: 4: Min assist;From bed;From chair/3-in-1 Stand to Sit: 4: Min assist;To bed;To chair/3-in-1 Details for Transfer Assistance: Min A for sit <> stand and stand pivot transfer. Cues and assist for technique.     Exercise        End of Session OT - End of Session Equipment Utilized During Treatment: Gait belt Activity Tolerance: Patient tolerated treatment well Patient left: in bed;with call bell/phone within reach;with bed alarm set Nurse Communication: Other (comment);Mobility status (discussed with tech)  GO Functional Assessment Tool Used: clinical judgment Functional Limitation: Self care Self Care Current Status (K1601): At least 20 percent but less than 40 percent impaired, limited or restricted Self Care Goal Status (U9323): At least 1 percent but less than 20 percent impaired, limited or restricted   Benito Mccreedy OTR/L [phone] 02/03/2013, 6:01 PM

## 2013-02-03 NOTE — Progress Notes (Signed)
Stroke Team Progress Note  HISTORY Casey [name] is an 73 y.o. female with a history of stroke who presents today 02/02/2013 with vision loss. The patient is a poor historian. It seems that prior to this event she had difficulty seeing to one side but is unable to tell me which side. The patient reports that 1-2 days ago she began to feel that she was gradually losing her vision. She reports that today she was unable to see at all. It seems that at some point in time she was able to contact friends who called EMS. Patient was brought in for evaluation.  Patient was not a TPA candidate secondary to inability to determine LKW, On Coumadin. She was admitted for further evaluation and treatment.  SUBJECTIVE No family is at the bedside.  Overall she feels her condition is unchanged. "I can't see anything".  OBJECTIVE Most recent Vital Signs: Filed Vitals:   02/03/13 0000 02/03/13 0200 02/03/13 0400 02/03/13 0600  BP: 157/104 155/91 169/92 155/84  Pulse: 79 86 87 81  Temp: 97.5 F (36.4 C)   98.1 F (36.7 C)  TempSrc: Oral   Oral  Resp: 20   18  Height: 5\' 5"  (1.651 m)     Weight: 72.712 kg (160 lb 4.8 oz)     SpO2: 100%   97%   CBG (last 3)   Recent Labs  02/03/13 0756  GLUCAP 140*    IV Fluid Intake:   . sodium chloride      MEDICATIONS  . allopurinol  100 mg Oral Daily  . amLODipine  5 mg Oral Daily  . aspirin  81 mg Oral Daily  . digoxin  0.125 mg Oral Daily  . donepezil  10 mg Oral QHS  . [START ON 02/05/2013] furosemide  20 mg Oral Daily  . insulin aspart  0-9 Units Subcutaneous TID WC  . levETIRAcetam  250 mg Oral BID  . metoprolol succinate  50 mg Oral BID  . oxybutynin  5 mg Oral BID  . potassium chloride  10 mEq Oral Daily  . simvastatin  20 mg Oral Daily  . warfarin  7.5 mg Oral ONCE-1800  . Warfarin - Pharmacist Dosing Inpatient   Does not apply q1800   PRN:  traMADol  Diet:  Carb Control thin liquids Activity:   Bathroom privileges with assistance DVT  Prophylaxis:  warfarin  CLINICALLY SIGNIFICANT STUDIES Basic Metabolic Panel:  Recent Labs Lab 02/02/13 1845 02/02/13 1926  NA 139 140  K 5.3 5.2  CL 102 106  CO2 26  --   GLUCOSE 152* 152*  BUN 26* 27*  CREATININE 1.33* 1.40*  CALCIUM 9.8  --    Liver Function Tests:  Recent Labs Lab 02/02/13 1845  AST 22  ALT 11  ALKPHOS 93  BILITOT 0.4  PROT 7.9  ALBUMIN 3.7   CBC:  Recent Labs Lab 02/02/13 1845 02/02/13 1926  WBC 8.8  --   NEUTROABS 6.1  --   HGB 14.1 15.6*  HCT 42.1 46.0  MCV 87.2  --   PLT 184  --    Coagulation:  Recent Labs Lab 02/02/13 1845 02/03/13 0500  LABPROT 12.7 13.3  INR 0.97 1.03   Cardiac Enzymes:  Recent Labs Lab 02/02/13 1845  TROPONINI <0.30   Urinalysis:  Recent Labs Lab 02/02/13 2008  COLORURINE YELLOW  LABSPEC 1.008  PHURINE 5.5  GLUCOSEU NEGATIVE  HGBUR NEGATIVE  BILIRUBINUR NEGATIVE  KETONESUR NEGATIVE  PROTEINUR NEGATIVE  UROBILINOGEN 0.2  NITRITE NEGATIVE  LEUKOCYTESUR NEGATIVE   Lipid Panel    Component Value Date/Time   CHOL 192 02/03/2013 0500   TRIG 156* 02/03/2013 0500   HDL 72 02/03/2013 0500   CHOLHDL 2.7 02/03/2013 0500   VLDL 31 02/03/2013 0500   LDLCALC 89 02/03/2013 0500   HgbA1C  Lab Results  Component Value Date   HGBA1C 6.8* 04/28/2012    Urine Drug Screen:     Component Value Date/Time   LABOPIA NONE DETECTED 02/02/2013 2008   COCAINSCRNUR NONE DETECTED 02/02/2013 2008   LABBENZ NONE DETECTED 02/02/2013 2008   AMPHETMU NONE DETECTED 02/02/2013 2008   THCU NONE DETECTED 02/02/2013 2008   LABBARB NONE DETECTED 02/02/2013 2008    Alcohol Level:  Recent Labs Lab 02/02/13 1845  ETH <11     CT of the brain  02/02/2013    1. No acute intracranial abnormality. 2. Remote infarcts involving the left frontal and parietal lobes as well as the right occipital lobe. 3. Atrophy with chronic microvascular ischemic changes.     CT of the brain and neck  02/02/2013    1. Distal left P2 occlusion. No definite  occipital infarct by CT. 2. 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 3. 50-60% stenosis of the cervical right ICA secondary to atherosclerotic plaque and vessel kinking. 4. 7 mm right upper lobe pulmonary nodule. Recommend chest CT followup in 3 months. 5. Mediastinal and hilar lymphadenopathy, chronic based on reporting from 2004.    MRI of the brain   2D Echocardiogram    EKG  atrial fibrillation, rate 86.   Therapy Recommendations   Physical Exam   Frail elderly African American lady currently not in distress.Awake alert. Afebrile. Head is nontraumatic. Neck is supple without bruit. Hearing is normal. Cardiac exam no murmur or gallop. Lungs are clear to auscultation. Distal pulses are well felt. Neurological Exam : Awake alert oriented x2. Diminished attention registration and recall. No aphasia apraxia dysarthria. Slight left gaze preference but will follow gaze not erections. 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 Pupils are 4 mm equal and reactive to light. Fundi were not visualized. Mild left lower facial asymmetry. Tongue is midline. Motor system exam symmetric upper and lower distant no focal weakness. Moves all 4 extremities well against gravity. Sensation appears preserved bilaterally. Plantars are downgoing. Gait was not tested. ASSESSMENT Casey [name] is a 73 y.o. female presenting with loss of vision.  CT does not confirm a new stroke, though based on sx and left P2 occlusion, suspicious for a new left PCA infarct not seen on CT. Infarct felt to be embolic secondary to known atrial fibrillation. This likely new stroke in the setting of an old right PCA infarct that would have given pt a right field cut (could see to the left). 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 On aspirin 81 mg orally every day and warfarin prior to admission; INR on admission  was subtherapeutic at 0.97.  Now on aspirin 81 mg orally every day and warfarin for secondary stroke prevention. Patient with resultant left field cut that would lead to her current cortical blindness. Work up underway.  atrial fibrillation - on coumadin PTA, were considering change to xarelto prior to admission Hyperlipidemia, LDL 89, on zocor 20 PTA, now on zocor 20, goal LDL < 100 (< 70 for diabetics) Hx stroke Hx CLL PVD  CHF Renal insufficiency, Cr. 1.4  Hospital day # 1  TREATMENT/PLAN  Continue aspirin 81 mg orally every day and warfarin for secondary stroke prevention. Given Creatinine, would be cautious with NOAC.  F/u MRI for confirmation of new stroke  F/u 2D echo  Burnetta Sabin, MSN, RN, ANVP-BC, ANP-BC, GNP-BC [facility] Pager: [phone] 02/03/2013 10:21 AM  I have personally obtained a history, examined the patient, evaluated imaging results, and formulated the assessment and plan of care. I agree with the above. Antony Contras, MD

## 2013-02-03 NOTE — Progress Notes (Signed)
  Echocardiogram 2D Echocardiogram has been performed.  [place], [place] 02/03/2013, 4:04 PM

## 2013-02-03 NOTE — Evaluation (Addendum)
Physical Therapy Evaluation Patient Details Name: Casey [name] MRN: [id number] DOB: Jul 26, 1940 Today's Date: 02/03/2013 Time: 4098-1191 PT Time Calculation (min): 26 min  PT Assessment / Plan / Recommendation History of Present Illness  73 y.o. female admitted to [facility] on 02/02/13 with hx of afib on Coumadin, though there were thought about switching her to Xarelto due to difficulty regulating her INR, hyperlipidemia, prior CVA, hx of CLL, PVD, gout, CHF, presents to the ER with visual loss.  She said she had a bad eye before, and thought it was the left eye, but she is quite a poor historian.  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  She has no complaint of focal weakness, palpitation, or shortness of breath.  Her work up in the ER included a head CT, which showed remote infarcts of the left frontal and parietal lobes, along with the right occipital lobe.  Her EKG showed afib with rate controlled, and no acute ST_T changes.  She has an unremarkable CBC, electrolytes, and a Cr of 1.4.  Neurology was consulted and hospitalist was asked to admit her for acute CVA.  Clinical Impression  Pt is unable to give any kind of clear history at this point.  She does demonstrate significant visual deficits, but at times it seems as though she can see something (she reached in the direction of the telemetry box when it dropped from her pocket, RN tech reports similar thing when feeding her earlier today), but reports unable to see anything at all to me.  Her vision and deficits that are left over from a previous stroke put her at extremely high risk for falling at home.  She would benefit from SNF for rehab with possible transition to ALF after the rehab vs living with family because with new vision deficits, and significant cognitive deficits I am not sure that she will be safe to live alone from this point forward.      PT Assessment  Patient needs continued PT services    Follow Up Recommendations  SNF    Does the patient  have the potential to tolerate intense rehabilitation     NA  Barriers to Discharge Decreased caregiver support as far as I know she does not have a caregiver who could provide 24/7 physical assistance.      Equipment Recommendations  Other (comment) (TBD- she was unable to report home equipment)    Recommendations for Other Services Other (comment) (services for the blind?)   Frequency Min 3X/week    Precautions / Restrictions Precautions Precautions: Fall Precaution Comments: due to visual deficits and right sided weakness (upper extremity)   Pertinent Vitals/Pain See vitals flow sheet.      Mobility  Bed Mobility Bed Mobility: Supine to Sit;Sitting - Scoot to [employer] of Bed;Sit to Supine;Scooting to [facility] Supine to Sit: 4: Min assist;With rails;HOB flat Sitting - Scoot to Edge of Bed: 4: Min assist;With rail Sit to Supine: 4: Min assist;With rail;HOB flat Scooting to HOB: 4: Min assist;With rail Details for Bed Mobility Assistance: Min assist to use tactile cues to assist pt to the side of the bed.  Hand over hand assist to find bed rail to pull up to sitting.  Min assist at trunk to stabilize during transition to sitting and to help pt weight shift to scoot to EOB.   Transfers Transfers: Sit to Stand;Stand to Sit Sit to Stand: 4: Min assist;With upper extremity assist;With armrests;From bed Stand to Sit: 4: Min assist;With upper extremity assist;With armrests;To  bed Details for Transfer Assistance: min assist to support trunk for balance during transition to stand.  Tactile cues to find bed rail to use to push up.   Ambulation/Gait Ambulation/Gait Assistance: 4: Min assist Ambulation Distance (Feet): 5 Feet Assistive device: 2 person hand held assist Ambulation/Gait Assistance Details: PT had pt put her left hand on therapist's shoulder for balance and support and help her know where she is in space.  We side stepped to both the right and left next to the EOB.  No obvious lower  extremity buckling noted with side stepping.  Due to pt's cognitive status and her visual deficits I did not feel safe with her sitting up in the recliner chair even with a chair alarm (she is also at the far end of the hall away from the nurses' station.   Gait Pattern: Step-to pattern;Shuffle Modified Rankin (Stroke Patients Only) Pre-Morbid Rankin Score: No significant disability Modified Rankin: Moderately severe disability        PT Diagnosis: Difficulty walking;Abnormality of gait;Generalized weakness;Altered mental status  PT Problem List: Decreased strength;Decreased activity tolerance;Decreased balance;Decreased mobility;Decreased cognition;Decreased knowledge of use of DME;Decreased safety awareness;Other (comment) (visual deficits ) PT Treatment Interventions: DME instruction;Gait training;Functional mobility training;Therapeutic activities;Therapeutic exercise;Balance training;Stair training;Neuromuscular re-education;Cognitive remediation;Patient/family education     PT Goals(Current goals can be found in the care plan section) Acute Rehab PT Goals Patient Stated Goal: to go home and be independent again PT Goal Formulation: With patient Time For Goal Achievement: 02/17/13 Potential to Achieve Goals: Good  Visit Information  Last PT Received On: 02/03/13 Assistance Needed: +1 History of Present Illness: 73 y.o. female admitted to [facility] on 02/02/13 with hx of afib on Coumadin, though there were thought about switching her to Xarelto due to difficulty regulating her INR, hyperlipidemia, prior CVA, hx of CLL, PVD, gout, CHF, presents to the ER with visual loss.  She said she had a bad eye before, and thought it was the left eye, but she is quite a poor historian.  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  She has no complaint of focal weakness, palpitation, or shortness of breath.  Her work up in the ER included a head CT, which showed remote infarcts of the left frontal and parietal lobes, along with  the right occipital lobe.  Her EKG showed afib with rate controlled, and no acute ST_T changes.  She has an unremarkable CBC, electrolytes, and a Cr of 1.4.  Neurology was consulted and hospitalist was asked to admit her for acute CVA.       Prior [place] expects to be discharged to:: Private residence Living Arrangements: Alone Type of Home: Mobile home Home Access: Stairs to enter Entrance Stairs-Number of Steps: 1 Entrance Stairs-Rails: None Home Layout: One level Home Equipment: Cane - single point Additional Comments: Pt is not giving an accurate history as she keeps saying, "I think" "maybe".  She at one point told me she lived in a mobile home and at another point told me she lived in a senior center.   Prior Function Level of Independence:  (not sure) Comments: pt reports she uses a cane outside of the home, does not drive, gets rides from friends, again poor historian an no family here to confirm any info given.   Communication Communication: No difficulties Dominant Hand: Left    Cognition  Cognition Arousal/Alertness: Awake/alert Behavior During Therapy: WFL for tasks assessed/performed Overall Cognitive Status: Impaired/Different from baseline Area of Impairment: Orientation;Safety/judgement Orientation Level: Disoriented  to;Place;Time;Situation ("Nov, 1971, I'm at home- at my living facility") Memory: Decreased short-term memory Safety/Judgement: Decreased awareness of safety;Decreased awareness of deficits General Comments: When talking about SNF level rehab pt seemed to have poor insight about how her new visual deficts would affect her ability to care for herself at home.      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  Decreased strength throughout.  OT to assess further as pt has difficulty with grip and  raising arm against gravity.  She is unable to tell me if this is new or old, but likely old based on PMHx.   Lower Extremity Assessment Lower Extremity Assessment: Generalized weakness (no obvious asymmetry noted left vs right with MMT EOB) Cervical / Trunk Assessment Cervical / Trunk Assessment: Normal   Balance Balance Balance Assessed: Yes Static Sitting Balance Static Sitting - Balance Support: Bilateral upper extremity supported;Feet supported Static Sitting - Level of Assistance: 5: Stand by assistance Static Standing Balance Static Standing - Balance Support: Left upper extremity supported Static Standing - Level of Assistance: 4: Min assist Dynamic Standing Balance Dynamic Standing - Balance Support: Left upper extremity supported Dynamic Standing - Level of Assistance: 4: Min assist  End of Session PT - End of Session Equipment Utilized During Treatment: Gait belt Activity Tolerance: Patient tolerated treatment well Patient left: in bed;with call bell/phone within reach;with bed alarm set Nurse Communication: Mobility status;Other (comment) (needs soft touch call bell- RN tech)  GP Functional Assessment Tool Used: assist level Functional Limitation: Mobility: Walking and moving around Mobility: Walking and Moving Around Current Status [phone]): At least 20 percent but less than 40 percent impaired, limited or restricted Mobility: Walking and Moving Around Goal Status [phone]): At least 1 percent but less than 20 percent impaired, limited or restricted   Casey Bronkema B. [name], [place], DPT [phone]   02/03/2013, 4:43 PM

## 2013-02-03 NOTE — ED Provider Notes (Signed)
I saw and evaluated the patient, reviewed the resident's note and I agree with the findings and plan.  EKG Interpretation    Date/Time:  Monday February 02 2013 18:49:32 EST Ventricular Rate:  86 PR Interval:    QRS Duration: 111 QT Interval:  389 QTC Calculation: 465 R Axis:   -65 Text Interpretation:  Atrial fibrillation Left anterior fascicular block Anteroseptal infarct, age indeterminate Lateral leads are also involved Similar to prior with lateral ST inversions Confirmed by Mingo Amber  MD, [place] ([id number]) on 02/02/2013 7:06:27 PM            Patient here with vision loss. Last known normal time was roughly 3 days ago. No need for code stroke. Patient denies any dizziness, weakness, numbness. Patient states she cannot see. She does not blink to threat. Patient relaxing comfortably in a stretcher. No strength or sensory deficits. Head CT shows remote occipital infarcts, but there is new from prior scan. We spoke with ophthalmology had some recommendations but does not feel this is likely ophthalmologic. Normal eye pressures. Patient admitted to medicine after neurology evaluated patient and recommended further imaging.  Osvaldo Shipper, MD 02/03/13 [phone]

## 2013-02-03 NOTE — Progress Notes (Signed)
ANTICOAGULATION CONSULT NOTE - Follow Up Consult  Pharmacy Consult for Coumadin Indication: atrial fibrillation, stroke  Allergies  Allergen Reactions  . Penicillins Other (See Comments)    Patient states that she was told previously by a doctor to not take this medication but is unsure why.     Patient Measurements: Height: 5\' 5"  (165.1 cm) Weight: 160 lb 4.8 oz (72.712 kg) IBW/kg (Calculated) : 57  Vital Signs: Temp: 98.1 F (36.7 C) (01/06 0600) Temp src: Oral (01/06 0600) BP: 155/84 mmHg (01/06 0600) Pulse Rate: 81 (01/06 0600)  Labs:  Recent Labs  02/02/13 1845 02/02/13 1926 02/03/13 0500  HGB 14.1 15.6*  --   HCT 42.1 46.0  --   PLT 184  --   --   APTT 28  --   --   LABPROT 12.7  --  13.3  INR 0.97  --  1.03  CREATININE 1.33* 1.40*  --   TROPONINI <0.30  --   --     Estimated Creatinine Clearance: 36.3 ml/min (by C-G formula based on Cr of 1.4).  Assessment: 73 y/o female on chronic Coumadin for Afib and hx stroke who presents with vision loss likely due to a new stroke. INR was at baseline on admission and is subtherapeutic today at 1.03. No bleeding noted, CBC wnl.  Goal of Therapy:  INR 2-3 Monitor platelets by anticoagulation protocol: Yes   Plan:  -Coumadin 7.5 mg PO x1 tonight -INR daily -Monitor for s/sx bleeding  [facility], Pharm.D., BCPS Clinical Pharmacist Pager: [phone] 02/03/2013 8:23 AM

## 2013-02-03 NOTE — Plan of Care (Signed)
Problem: Acute Treatment Outcomes Goal: Prognosis discussed with family/patient as appropriate Outcome: Completed/Met Date Met:  02/03/13 Discussed with pts son

## 2013-02-03 NOTE — Progress Notes (Signed)
ANTICOAGULATION CONSULT NOTE - Initial Consult  Pharmacy Consult for Coumadin Indication: atrial fibrillation and stroke  Allergies  Allergen Reactions  . Penicillins Other (See Comments)    Patient states that she was told previously by a doctor to not take this medication but is unsure why.     Patient Measurements: Height: 5\' 5"  (165.1 cm) Weight: 160 lb 4.8 oz (72.712 kg) IBW/kg (Calculated) : 57  Vital Signs: Temp: 97.5 F (36.4 C) (01/06 0000) Temp src: Oral (01/06 0000) BP: 157/104 mmHg (01/06 0000) Pulse Rate: 79 (01/06 0000)  Labs:  Recent Labs  02/02/13 1845 02/02/13 1926  HGB 14.1 15.6*  HCT 42.1 46.0  PLT 184  --   APTT 28  --   LABPROT 12.7  --   INR 0.97  --   CREATININE 1.33* 1.40*  TROPONINI <0.30  --     Estimated Creatinine Clearance: 36.3 ml/min (by C-G formula based on Cr of 1.4).   Medical History: Past Medical History  Diagnosis Date  . Chronic atrial fibrillation   . Stroke   . Hypertension   . Hypercholesterolemia   . Diabetes mellitus   . Sinus of Valsalva aneurysm     a. By 2D echo 05/2011.  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  . CHF (congestive heart failure)   . GERD (gastroesophageal reflux disease)   . Seizures   . Urinary frequency   . Hypopotassemia   . Long term (current) use of anticoagulants   . Unspecified disorder of kidney and ureter   . Unspecified hereditary and idiopathic peripheral neuropathy   . Unspecified late effects of cerebrovascular disease   . Edema   . Peripheral vascular disease, unspecified   . Unspecified urinary incontinence   . Congestive heart failure, unspecified   . Gout, unspecified   . Abdominal or pelvic swelling, mass, or lump, left upper quadrant   . Vascular dementia, uncomplicated   . Electrolyte and fluid disorders not elsewhere classified   . Hypopotassemia    . Long term (current) use of anticoagulants   . Chronic kidney disease   . Unspecified hereditary and idiopathic peripheral neuropathy   . Unspecified late effects of cerebrovascular disease   . Coronary atherosclerosis of unspecified type of vessel, native or graft   . Peripheral vascular disease, unspecified   . Gout, unspecified     Medications:  Prescriptions prior to admission  Medication Sig Dispense Refill  . allopurinol (ZYLOPRIM) 100 MG tablet Take 1 tablet by mouth daily. Take 1 tablet once daily      . AMBULATORY NON FORMULARY MEDICATION BD Lancets Use as Directed to check blood sugar Dx: 250.00  100 Units  11  . amLODipine (NORVASC) 5 MG tablet Take 1 tablet by mouth daily. Take 1 tablet once daily      . aspirin 81 MG tablet Take 81 mg by mouth daily.      Marland Kitchen DIGOX 125 MCG tablet       . donepezil (ARICEPT) 10 MG tablet Take 1 tablet by mouth at bedtime. Take 1 tablet at bedtime      . furosemide (LASIX) 20 MG tablet Take 1 tablet by mouth daily. Take 1 tablet once daily      . glucose blood (TRUETEST TEST) test strip Test blood sugar twice daily to control blood sugar. 250.00  100 each  12  .  HYDROcodone-acetaminophen (NORCO) 10-325 MG per tablet Take 1 tablet by mouth every 6 (six) hours as needed for pain. Take one tablet by mouth every 6 hours as needed for severe pain  120 tablet  0  . Insulin Syringe-Needle U-100 (BD INSULIN SYRINGE ULTRAFINE) 31G X 5/16" 0.3 ML MISC USE TO GIVE NOVOLOG AND LANTUS INSULIN 4 TIMES A DAY.  100 each  3  . LANTUS SOLOSTAR 100 UNIT/ML SOPN       . levETIRAcetam (KEPPRA) 250 MG tablet       . metFORMIN (GLUCOPHAGE) 500 MG tablet Take 1 tablet (500 mg total) by mouth 2 (two) times daily with a meal.  60 tablet  0  . metFORMIN (GLUCOPHAGE) 500 MG tablet       . metoprolol succinate (TOPROL-XL) 50 MG 24 hr tablet Take 1 tablet by mouth 2 (two) times daily. Take 1 tablet twice daily      . oxybutynin (DITROPAN) 5 MG tablet Take 1 tablet by  mouth 2 (two) times daily. Take 1 tablet in the morning and 2 tablets in the evening      . potassium chloride (K-DUR) 10 MEQ tablet Take 1 tablet by mouth daily. Take 1 tablet once daily      . simvastatin (ZOCOR) 20 MG tablet Take 1 tablet by mouth daily. Take 1 tablet once daily      . traMADol (ULTRAM) 50 MG tablet Take 1 tablet by mouth 3 (three) times daily as needed for moderate pain. Take 1 tablet three times daily as needed for pain      . warfarin (COUMADIN) 5 MG tablet Take 1 tablet on Sundays, Tuesday, Wednesday, Thursday, and Saturdays.  Take 1&1/2 tablets on Mondays and Fridays  30 tablet  3   Scheduled:  . allopurinol  100 mg Oral Daily  . amLODipine  5 mg Oral Daily  . aspirin  81 mg Oral Daily  . digoxin  0.125 mg Oral Daily  . donepezil  10 mg Oral QHS  . furosemide  20 mg Oral Daily  . insulin aspart  0-9 Units Subcutaneous Q4H  . levETIRAcetam  250 mg Oral BID  . metoprolol succinate  50 mg Oral BID  . oxybutynin  5 mg Oral BID  . potassium chloride  10 mEq Oral Daily  . simvastatin  20 mg Oral Daily  . warfarin  7.5 mg Oral NOW  . Warfarin - Pharmacist Dosing Inpatient   Does not apply q1800    Assessment: 73yo female c/o vision loss over 1-2 days but is a poor historian, neuro concerned about posterior circulation ischemic event, CT reveals evidence of remote infarcts but none acute, for now to continue Coumadin w/ plan to decide whether pt is appropriate candidate for continued anticoag; pt admitted w/ INR near baseline, per most recent OP ds note in Epic pt had not been having INR checked and pt's son/caregiver was to assume role of taking pt to have INR checked regularly, unclear whether this has happened.  Goal of Therapy:  INR 2-3   Plan:  Will give Coumadin 7.5mg  po now and monitor INR for dose adjustments.  Wynona Neat, PharmD, BCPS  02/03/2013,12:38 AM

## 2013-02-03 NOTE — Consult Note (Signed)
Reason for consult:  Vision loss  HPI: Casey [name] is an 73 y.o. female.  She reports that vision has been poor OU "for a long time".  She is a poor historian.  She reports she had strokes.  She cannot tell me if vision has changed recently or not.  "It has been bad for a long time."  No eye pain.  She has not seen an eye doctor in years.  She has multiple comorbidities associated with stroke.  She denies past eye surgery.    Past Medical History  Diagnosis Date  . Chronic atrial fibrillation   . Stroke   . Hypertension   . Hypercholesterolemia   . Diabetes mellitus   . Sinus of Valsalva aneurysm     a. By 2D echo 05/2011.  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  . CHF (congestive heart failure)   . GERD (gastroesophageal reflux disease)   . Seizures   . Urinary frequency   . Hypopotassemia   . Long term (current) use of anticoagulants   . Unspecified disorder of kidney and ureter   . Unspecified hereditary and idiopathic peripheral neuropathy   . Unspecified late effects of cerebrovascular disease   . Edema   . Peripheral vascular disease, unspecified   . Unspecified urinary incontinence   . Congestive heart failure, unspecified   . Gout, unspecified   . Abdominal or pelvic swelling, mass, or lump, left upper quadrant   . Vascular dementia, uncomplicated   . Electrolyte and fluid disorders not elsewhere classified   . Hypopotassemia   . Long term (current) use of anticoagulants   . Chronic kidney disease   . Unspecified hereditary and idiopathic peripheral neuropathy   . Unspecified late effects of cerebrovascular disease   . Coronary atherosclerosis of unspecified type of vessel, native or graft   . Peripheral vascular disease, unspecified   . Gout, unspecified    Past Surgical History  Procedure Laterality Date  . Femoral to femoral bypass  graft     . Femoral artery exploration  12/06/2011    Procedure: FEMORAL ARTERY EXPLORATION;  Surgeon: Angelia Mould, MD;  Location: [facility] OR;  Service: Vascular;  Laterality: N/A;  Exploration of large pseudoaneurysm left side of fem-fem bypass graft   . Femoral-femoral bypass graft  12/06/2011    Procedure: BYPASS GRAFT FEMORAL-FEMORAL ARTERY;  Surgeon: Angelia Mould, MD;  Location: [facility] OR;  Service: Vascular;  Laterality: Bilateral;  Revision of left to right Femoral-Femoral bypass graft  . False aneurysm repair  12/06/2011    Procedure: REPAIR FALSE ANEURYSM;  Surgeon: Angelia Mould, MD;  Location: [place];  Service: Vascular;  Laterality: Left;  Repair of left femoral Artery pseudoaneurysm  . Femoral-popliteal bypass graft Left 05/2002    lower extremity femoral to below knee w/ non-versed greater saphenous vein  . Femoral-popliteal bypass graft Left 11/2002  . Tubal ligation Bilateral   . Aortogram  09/01/2008    Abd w/ bilateral lower extremity runoff arteriography   Family History  Problem Relation Age of Onset  . Cancer Mother     Cervical  . Stroke Mother   . Diabetes Mother   . Stroke Father   . Heart disease Father    Current Facility-Administered Medications  Medication Dose Route Frequency Provider Last Rate Last Dose  . 0.9 %  sodium chloride infusion  Intravenous Continuous Clanford Marisa Hua, MD 50 mL/hr at 02/03/13 1048    . allopurinol (ZYLOPRIM) tablet 100 mg  100 mg Oral Daily Orvan Falconer, MD   100 mg at 02/03/13 1029  . amLODipine (NORVASC) tablet 5 mg  5 mg Oral Daily Orvan Falconer, MD   5 mg at 02/03/13 1029  . aspirin chewable tablet 81 mg  81 mg Oral Daily Orvan Falconer, MD   81 mg at 02/03/13 1029  . digoxin (LANOXIN) tablet 0.125 mg  0.125 mg Oral Daily Orvan Falconer, MD   0.125 mg at 02/03/13 1029  . donepezil (ARICEPT) tablet 10 mg  10 mg Oral QHS Orvan Falconer, MD   10 mg at 02/03/13 0120  . [START ON 02/05/2013] furosemide (LASIX) tablet 20 mg  20 mg Oral Daily  Clanford L Johnson, MD      . insulin aspart (novoLOG) injection 0-9 Units  0-9 Units Subcutaneous TID WC Orvan Falconer, MD   1 Units at 02/03/13 1240  . levETIRAcetam (KEPPRA) tablet 250 mg  250 mg Oral BID Orvan Falconer, MD   250 mg at 02/03/13 1029  . metoprolol succinate (TOPROL-XL) 24 hr tablet 50 mg  50 mg Oral BID Orvan Falconer, MD   50 mg at 02/03/13 1029  . oxybutynin (DITROPAN) tablet 5 mg  5 mg Oral BID Orvan Falconer, MD   5 mg at 02/03/13 1029  . potassium chloride (K-DUR) CR tablet 10 mEq  10 mEq Oral Daily Orvan Falconer, MD   10 mEq at 02/03/13 1030  . simvastatin (ZOCOR) tablet 20 mg  20 mg Oral Daily Orvan Falconer, MD   20 mg at 02/03/13 1029  . traMADol (ULTRAM) tablet 50 mg  50 mg Oral TID PRN Orvan Falconer, MD      . Warfarin - Pharmacist Dosing Inpatient   Does not apply q1800 Rogue Bussing, [facility]       Allergies  Allergen Reactions  . Penicillins Other (See Comments)    Patient states that she was told previously by a doctor to not take this medication but is unsure why.    History   Social History  . Marital Status: Widowed    Spouse Name: N/A    Number of Children: N/A  . Years of Education: N/A   Occupational History  . Not on file.   Social History Main Topics  . Smoking status: Former Smoker    Types: Cigarettes    Quit date: 01/29/2001  . Smokeless tobacco: Never Used  . Alcohol Use: No  . Drug Use: No  . Sexual Activity: No   Other Topics Concern  . Not on file   Social History Narrative  . No narrative on file    Review of systems: ROS  General:  No fever or chills. CV: no chest pain Pulm: no SOB GI: no nausea Derm: no rash MS: no aches Neuro: no headache  Physical Exam:   Patient lying in bed in NAD.  She is awake and answering questions, but her answers are not consistent. Blood pressure 138/81, pulse 81, temperature 98.1 F (36.7 C), temperature source Oral, resp. rate 18, height '5\' 5"'  (1.651 m), weight 72.712 kg (160 lb 4.8 oz), SpO2 97.00%.   VA cc (OTC  rdrs):  OD: Handmotion vs LP    OS:  Handmotion vs. LP  (Sometimes she sees my hand move with either eye, and sometimes she does not  Pupils:   OD round,  Sluggish reaction to light,  no APD            OS round, sluggish reaction to light, no APD  IOP (T pen)  OD 22  OS  22  (Squeezing OU)  CVF: Unable.  Patient unable to maintain fixation  Motility:  OD full ductions  OS full ductions (When asked to look in different directions.  She cannot fix or follow)  Balance/alignment:  Ortho by Luiz Ochoa   Slit lamp examination:                                 OD                                       External/adnexa: Normal                                      Lids/lashes:        Normal                                      Conjunctiva        White, quiet        Cornea:              Clear                  AC:                     Deep, quiet                                Iris:                     Normal        Lens:                 NS and cortical cataract                                     OS                                       External/adnexa: Normal                                      Lids/lashes:        Normal                                      Conjunctiva        White, quiet        Cornea:              Clear                  AC:  Deep, quiet                                Iris:                     Normal        Lens:                  NS and Cortical Cataract  Dilated fundus exam: (Neo 2.5; Myd 1%)      OD Vitreous            Clear, quiet                                Optic Disc:       Normal, perfused                      Macula:             Flat                                            Vessels:           Normal caliber,distribution         Periphery:         Flat, attached                                      OS Vitreous            Clear, quiet                                Optic Disc:       Normal, perfused                      Macula:              Flat                                            Vessels:           Normal caliber,distribution         Periphery:         Flat, attached        Labs/studies: Results for orders placed during the hospital encounter of 02/02/13 (from the past 48 hour(s))  ETHANOL     Status: None   Collection Time    02/02/13  6:45 PM      Result Value Range   Alcohol, Ethyl (B) <11  0 - 11 mg/dL   Comment:            LOWEST DETECTABLE LIMIT FOR     SERUM ALCOHOL IS 11 mg/dL     FOR MEDICAL PURPOSES ONLY  PROTIME-INR     Status: None   Collection Time    02/02/13  6:45 PM      Result Value Range   Prothrombin Time 12.7  11.6 - 15.2 seconds  INR 0.97  0.00 - 1.49  APTT     Status: None   Collection Time    02/02/13  6:45 PM      Result Value Range   aPTT 28  24 - 37 seconds  CBC     Status: None   Collection Time    02/02/13  6:45 PM      Result Value Range   WBC 8.8  4.0 - 10.5 K/uL   RBC 4.83  3.87 - 5.11 MIL/uL   Hemoglobin 14.1  12.0 - 15.0 g/dL   HCT 42.1  36.0 - 46.0 %   MCV 87.2  78.0 - 100.0 fL   MCH 29.2  26.0 - 34.0 pg   MCHC 33.5  30.0 - 36.0 g/dL   RDW 15.2  11.5 - 15.5 %   Platelets 184  150 - 400 K/uL  DIFFERENTIAL     Status: None   Collection Time    02/02/13  6:45 PM      Result Value Range   Neutrophils Relative % 70  43 - 77 %   Neutro Abs 6.1  1.7 - 7.7 K/uL   Lymphocytes Relative 20  12 - 46 %   Lymphs Abs 1.8  0.7 - 4.0 K/uL   Monocytes Relative 7  3 - 12 %   Monocytes Absolute 0.6  0.1 - 1.0 K/uL   Eosinophils Relative 3  0 - 5 %   Eosinophils Absolute 0.3  0.0 - 0.7 K/uL   Basophils Relative 1  0 - 1 %   Basophils Absolute 0.1  0.0 - 0.1 K/uL  COMPREHENSIVE METABOLIC PANEL     Status: Abnormal   Collection Time    02/02/13  6:45 PM      Result Value Range   Sodium 139  137 - 147 mEq/L   Potassium 5.3  3.7 - 5.3 mEq/L   Comment: HEMOLYSIS AT THIS LEVEL MAY AFFECT RESULT   Chloride 102  96 - 112 mEq/L   CO2 26  19 - 32 mEq/L   Glucose, Bld 152 (*) 70  - 99 mg/dL   BUN 26 (*) 6 - 23 mg/dL   Creatinine, Ser 1.33 (*) 0.50 - 1.10 mg/dL   Calcium 9.8  8.4 - 10.5 mg/dL   Total Protein 7.9  6.0 - 8.3 g/dL   Albumin 3.7  3.5 - 5.2 g/dL   AST 22  0 - 37 U/L   Comment: HEMOLYSIS AT THIS LEVEL MAY AFFECT RESULT   ALT 11  0 - 35 U/L   Comment: HEMOLYSIS AT THIS LEVEL MAY AFFECT RESULT   Alkaline Phosphatase 93  39 - 117 U/L   Total Bilirubin 0.4  0.3 - 1.2 mg/dL   GFR calc non Af Amer 39 (*) >90 mL/min   GFR calc Af Amer 45 (*) >90 mL/min   Comment: (NOTE)     The eGFR has been calculated using the CKD EPI equation.     This calculation has not been validated in all clinical situations.     eGFR's persistently <90 mL/min signify possible Chronic Kidney     Disease.  TROPONIN I     Status: None   Collection Time    02/02/13  6:45 PM      Result Value Range   Troponin I <0.30  <0.30 ng/mL   Comment:            Due to the release kinetics of cTnI,  a negative result within the first hours     of the onset of symptoms does not rule out     myocardial infarction with certainty.     If myocardial infarction is still suspected,     repeat the test at appropriate intervals.  POCT I-STAT TROPONIN I     Status: None   Collection Time    02/02/13  7:25 PM      Result Value Range   Troponin i, poc 0.03  0.00 - 0.08 ng/mL   Comment 3            Comment: Due to the release kinetics of cTnI,     a negative result within the first hours     of the onset of symptoms does not rule out     myocardial infarction with certainty.     If myocardial infarction is still suspected,     repeat the test at appropriate intervals.  POCT I-STAT, CHEM 8     Status: Abnormal   Collection Time    02/02/13  7:26 PM      Result Value Range   Sodium 140  137 - 147 mEq/L   Potassium 5.2  3.7 - 5.3 mEq/L   Chloride 106  96 - 112 mEq/L   BUN 27 (*) 6 - 23 mg/dL   Creatinine, Ser 1.40 (*) 0.50 - 1.10 mg/dL   Glucose, Bld 152 (*) 70 - 99 mg/dL   Calcium, Ion  1.27  1.13 - 1.30 mmol/L   TCO2 25  0 - 100 mmol/L   Hemoglobin 15.6 (*) 12.0 - 15.0 g/dL   HCT 46.0  36.0 - 46.0 %  URINE RAPID DRUG SCREEN (HOSP PERFORMED)     Status: None   Collection Time    02/02/13  8:08 PM      Result Value Range   Opiates NONE DETECTED  NONE DETECTED   Cocaine NONE DETECTED  NONE DETECTED   Benzodiazepines NONE DETECTED  NONE DETECTED   Amphetamines NONE DETECTED  NONE DETECTED   Tetrahydrocannabinol NONE DETECTED  NONE DETECTED   Barbiturates NONE DETECTED  NONE DETECTED   Comment:            DRUG SCREEN FOR MEDICAL PURPOSES     ONLY.  IF CONFIRMATION IS NEEDED     FOR ANY PURPOSE, NOTIFY LAB     WITHIN 5 DAYS.                LOWEST DETECTABLE LIMITS     FOR URINE DRUG SCREEN     Drug Class       Cutoff (ng/mL)     Amphetamine      1000     Barbiturate      200     Benzodiazepine   169     Tricyclics       450     Opiates          300     Cocaine          300     THC              50  URINALYSIS, ROUTINE W REFLEX MICROSCOPIC     Status: None   Collection Time    02/02/13  8:08 PM      Result Value Range   Color, Urine YELLOW  YELLOW   APPearance CLEAR  CLEAR   Specific Gravity, Urine 1.008  1.005 - 1.030  pH 5.5  5.0 - 8.0   Glucose, UA NEGATIVE  NEGATIVE mg/dL   Hgb urine dipstick NEGATIVE  NEGATIVE   Bilirubin Urine NEGATIVE  NEGATIVE   Ketones, ur NEGATIVE  NEGATIVE mg/dL   Protein, ur NEGATIVE  NEGATIVE mg/dL   Urobilinogen, UA 0.2  0.0 - 1.0 mg/dL   Nitrite NEGATIVE  NEGATIVE   Leukocytes, UA NEGATIVE  NEGATIVE   Comment: MICROSCOPIC NOT DONE ON URINES WITH NEGATIVE PROTEIN, BLOOD, LEUKOCYTES, NITRITE, OR GLUCOSE <1000 mg/dL.  SEDIMENTATION RATE     Status: None   Collection Time    02/03/13  5:00 AM      Result Value Range   Sed Rate 3  0 - 22 mm/hr  C-REACTIVE PROTEIN     Status: Abnormal   Collection Time    02/03/13  5:00 AM      Result Value Range   CRP <0.5 (*) <0.60 mg/dL   Comment: Performed at [facility]     Status: Abnormal   Collection Time    02/03/13  5:00 AM      Result Value Range   Hemoglobin A1C 7.0 (*) <5.7 %   Comment: (NOTE)                                                                               According to the ADA Clinical Practice Recommendations for 2011, when     HbA1c is used as a screening test:      >=6.5%   Diagnostic of Diabetes Mellitus               (if abnormal result is confirmed)     5.7-6.4%   Increased risk of developing Diabetes Mellitus     References:Diagnosis and Classification of Diabetes Mellitus,Diabetes     VHQI,6962,95(MWUXL 1):S62-S69 and Standards of Medical Care in             Diabetes - 2011,Diabetes Care,2011,34 (Suppl 1):S11-S61.   Mean Plasma Glucose 154 (*) <117 mg/dL   Comment: Performed at [facility]     Status: Abnormal   Collection Time    02/03/13  5:00 AM      Result Value Range   Cholesterol 192  0 - 200 mg/dL   Triglycerides 156 (*) <150 mg/dL   HDL 72  >39 mg/dL   Total CHOL/HDL Ratio 2.7     VLDL 31  0 - 40 mg/dL   LDL Cholesterol 89  0 - 99 mg/dL   Comment:            Total Cholesterol/HDL:CHD Risk     Coronary Heart Disease Risk Table                         Men   Women      1/2 Average Risk   3.4   3.3      Average Risk       5.0   4.4      2 X Average Risk   9.6   7.1      3 X Average Risk  23.4   11.0  Use the calculated Patient Ratio     above and the CHD Risk Table     to determine the patient's CHD Risk.                ATP III CLASSIFICATION (LDL):      <100     mg/dL   Optimal      100-129  mg/dL   Near or Above                        Optimal      130-159  mg/dL   Borderline      160-189  mg/dL   High      >190     mg/dL   Very High  PROTIME-INR     Status: None   Collection Time    02/03/13  5:00 AM      Result Value Range   Prothrombin Time 13.3  11.6 - 15.2 seconds   INR 1.03  0.00 - 1.49  GLUCOSE, CAPILLARY     Status: Abnormal    Collection Time    02/03/13  7:56 AM      Result Value Range   Glucose-Capillary 140 (*) 70 - 99 mg/dL  GLUCOSE, CAPILLARY     Status: Abnormal   Collection Time    02/03/13 11:30 AM      Result Value Range   Glucose-Capillary 128 (*) 70 - 99 mg/dL  GLUCOSE, CAPILLARY     Status: Abnormal   Collection Time    02/03/13  5:12 PM      Result Value Range   Glucose-Capillary 122 (*) 70 - 99 mg/dL   Ct Angio Head W/cm &/or Wo Cm  02/02/2013   CLINICAL DATA:  Vision changes.  Rule out straw  EXAM: CT ANGIOGRAPHY HEAD AND NECK  TECHNIQUE: Multidetector CT imaging of the head and neck was performed using the standard protocol during bolus administration of intravenous contrast. Multiplanar CT image reconstructions including MIPs were obtained to evaluate the vascular anatomy. Carotid stenosis measurements (when applicable) are obtained utilizing NASCET criteria, using the distal internal carotid diameter as the denominator.  CONTRAST:  43m OMNIPAQUE IOHEXOL 350 MG/ML SOLN  COMPARISON:  Head CT from the same day. Brain MRI and MRA 12/09/2008  FINDINGS: CTA HEAD FINDINGS  Posterior circulation: Most notable for abrupt vessel cut off of the distal left P2. Codominant vertebral arteries. No aneurysm or significant stenosis.  Right anterior circulation: Extensive carotid siphon atherosclerosis. No evidence of aneurysm or significant stenosis. No major vessel cut off.  Left anterior circulation: Chronic occlusion of the left ICA to the level of the supraclinoid carotid, were there is reconstitution via the posterior communicating artery. Left A1 is hypoplastic. The left A2 is chronically attenuated. No high-grade stenosis or major vessel occlusion seen more distally.  Remote infarcts in the left frontal lobe and right parasagittal lobe. It is difficult to clearly see infarct in the parasagittal left occipital lobe, although there is likely acute ischemia given the history and angiographic findings. There is also  subtle gray-white differentiation loss in this region. No abnormal intracranial enhancement. Patent dural venous sinuses.  Review of the MIP images confirms the above findings.  CTA NECK FINDINGS  Intrathoracic imaging notable for mediastinal and bilateral hilar lymphadenopathy. From chest CT 10/12/2002 report, this is a chronic finding. There is an irregularly marginated 7 mm nodule in the right upper lobe. Apical emphysema.  Two vessel aortic arch. Bilateral vertebral  arteries show mild luminal irregularity and mural calcifications, consistent with atherosclerosis. No significant stenosis.  There is extensive calcified and noncalcified plaque throughout the right carotid system, including the common carotid artery. There is kinking of the right internal carotid artery approximate 2 cm beyond the bifurcation, with kink and atherosclerosis causing 50-60% stenosis. No evidence of ulcerated plaque or dissection.  There is extensive atherosclerosis throughout the left common carotid artery. Chronic occlusion of the left ICA just beyond the bifurcation. The ICA is occluded to the supraclinoid segment, were there is reconstitution as noted below. This finding is chronic based on 2010 MRA.  Critical results were called by telephone at the time of interpretation on 02/02/2013 at 10:59 PM to Dr. Alexis Goodell , who verbally acknowledged these results.  Review of the MIP images confirms the above findings.  IMPRESSION: 1. Distal left P2 occlusion. No definite occipital infarct by CT, MRI MRI could confirm the presence of infarction. 2. 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 3. 50-60% stenosis of the cervical right ICA secondary to atherosclerotic plaque and vessel kinking. 4. 7 mm right upper lobe pulmonary nodule. Recommend chest CT followup in 3 months. 5. Mediastinal and hilar lymphadenopathy, chronic based on reporting from 2004. Is there  history of sarcoidosis?   Electronically Signed   By: Jorje Guild M.D.   On: 02/02/2013 23:18   Ct Head Wo Contrast  02/02/2013   CLINICAL DATA:  Vision changes  EXAM: CT HEAD WITHOUT CONTRAST  TECHNIQUE: Contiguous axial images were obtained from the base of the skull through the vertex without intravenous contrast.  COMPARISON:  Prior MRI from 12/09/2008 and prior CT from 12/08/2008  FINDINGS: Wedge-shaped encephalomalacia within the left frontal lobe and right occipital lobe is seen, consistent with remote infarct. Remote left cerebral watershed infarct also present. Additional scattered and confluent hypodensity within the periventricular and deep white matter are most consistent with chronic microvascular ischemic change. No definite acute large vessel territory infarct. There is no acute intracranial hemorrhage.  No mass lesion or midline shift. No hydrocephalus. There is no extra-axial fluid collection.  The calvarium is intact. Orbits are normal. Paranasal sinuses and mastoid air cells are clear.  IMPRESSION: 1. No acute intracranial abnormality. 2. Remote infarcts involving the left frontal and parietal lobes as well as the right occipital lobe. 3. Atrophy with chronic microvascular ischemic changes.   Electronically Signed   By: Jeannine Boga M.D.   On: 02/02/2013 19:26   Ct Angio Neck W/cm &/or Wo/cm  02/02/2013   CLINICAL DATA:  Vision changes.  Rule out straw  EXAM: CT ANGIOGRAPHY HEAD AND NECK  TECHNIQUE: Multidetector CT imaging of the head and neck was performed using the standard protocol during bolus administration of intravenous contrast. Multiplanar CT image reconstructions including MIPs were obtained to evaluate the vascular anatomy. Carotid stenosis measurements (when applicable) are obtained utilizing NASCET criteria, using the distal internal carotid diameter as the denominator.  CONTRAST:  40m OMNIPAQUE IOHEXOL 350 MG/ML SOLN  COMPARISON:  Head CT from the same day. Brain  MRI and MRA 12/09/2008  FINDINGS: CTA HEAD FINDINGS  Posterior circulation: Most notable for abrupt vessel cut off of the distal left P2. Codominant vertebral arteries. No aneurysm or significant stenosis.  Right anterior circulation: Extensive carotid siphon atherosclerosis. No evidence of aneurysm or significant stenosis. No major vessel cut off.  Left anterior circulation: Chronic occlusion of the left ICA to the level  of the supraclinoid carotid, were there is reconstitution via the posterior communicating artery. Left A1 is hypoplastic. The left A2 is chronically attenuated. No high-grade stenosis or major vessel occlusion seen more distally.  Remote infarcts in the left frontal lobe and right parasagittal lobe. It is difficult to clearly see infarct in the parasagittal left occipital lobe, although there is likely acute ischemia given the history and angiographic findings. There is also subtle gray-white differentiation loss in this region. No abnormal intracranial enhancement. Patent dural venous sinuses.  Review of the MIP images confirms the above findings.  CTA NECK FINDINGS  Intrathoracic imaging notable for mediastinal and bilateral hilar lymphadenopathy. From chest CT 10/12/2002 report, this is a chronic finding. There is an irregularly marginated 7 mm nodule in the right upper lobe. Apical emphysema.  Two vessel aortic arch. Bilateral vertebral arteries show mild luminal irregularity and mural calcifications, consistent with atherosclerosis. No significant stenosis.  There is extensive calcified and noncalcified plaque throughout the right carotid system, including the common carotid artery. There is kinking of the right internal carotid artery approximate 2 cm beyond the bifurcation, with kink and atherosclerosis causing 50-60% stenosis. No evidence of ulcerated plaque or dissection.  There is extensive atherosclerosis throughout the left common carotid artery. Chronic occlusion of the left ICA just  beyond the bifurcation. The ICA is occluded to the supraclinoid segment, were there is reconstitution as noted below. This finding is chronic based on 2010 MRA.  Critical results were called by telephone at the time of interpretation on 02/02/2013 at 10:59 PM to Dr. Alexis Goodell , who verbally acknowledged these results.  Review of the MIP images confirms the above findings.  IMPRESSION: 1. Distal left P2 occlusion. No definite occipital infarct by CT, MRI MRI could confirm the presence of infarction. 2. 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 3. 50-60% stenosis of the cervical right ICA secondary to atherosclerotic plaque and vessel kinking. 4. 7 mm right upper lobe pulmonary nodule. Recommend chest CT followup in 3 months. 5. Mediastinal and hilar lymphadenopathy, chronic based on reporting from 2004. Is there history of sarcoidosis?   Electronically Signed   By: Jorje Guild M.D.   On: 02/02/2013 23:18   Mr Brain Wo Contrast  02/03/2013   CLINICAL DATA:  Vision changes.  Rule out stroke.  EXAM: MRI HEAD WITHOUT CONTRAST  TECHNIQUE: Multiplanar, multiecho pulse sequences of the brain and surrounding structures were obtained without intravenous contrast.  COMPARISON:  Head CT 02/02/2013 and brain MRI 12/09/2008  FINDINGS: There is a 3.9 cm focus of acute infarct involving the medial left occipital lobe with extension into the posterior medial left parietal lobe. There are also 2 subcentimeter foci of acute infarct involving the left thalamus. Left frontoparietal encephalomalacia and right occipital encephalomalacia are consistent with remote infarcts. Incidental note is made of a cavum septum pellucidum et vergae. There is ex vacuo dilatation of the occipital horn of the right lateral ventricle. There is no midline shift, mass, intracranial hemorrhage, or extra-axial fluid collection. Absent intracranial left ICA flow void is  consistent with known occlusion. Orbits are unremarkable. Paranasal sinuses and mastoid air cells are clear.  IMPRESSION: 1. Acute left PCA infarcts involving the left parieto-occipital region and left thalamus. 2. Remote left MCA and right PCA infarcts.   Electronically Signed   By: Logan Bores   On: 02/03/2013 19:54  Assessment and Plan: 1.  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  This is an issue for neurology. 2.  Cataracts.  Cataract surgery unlikely to improve vision because of diagnosis #1   All of the above information was relayed to the patient.   Follow up contact information was provided.  All questions were answered.   Quirino Kakos L 02/03/2013, 9:02 PM  [facility] [phone]

## 2013-02-03 NOTE — Progress Notes (Signed)
SLP Cancellation Note  Patient Details Name: Casey [name] MRN: [id number] DOB: 09-Oct-1940   Cancelled treatment:        Order received for swallow evaluation, however, it was noted that the pt. Had passed the RN swallow screen.  Discussed with RN, who states pt. Appears to be tolerating a regular diet without difficulty.  RN does not note any speech/language difficulties either, therefore, eval is being deferred.  Please reorder if there was indeed a need for SLP evaluation.  Thanks,   Quinn Axe T 02/03/2013, 1:53 PM

## 2013-02-03 NOTE — Progress Notes (Signed)
[facility] PROGRESS NOTE  Casey [name]:[id number] DOB: [birth year] DOA: 02/02/2013 PCP: Oneal Grout, MD  Assessment/Plan: Suspected  Acute CVA (cerebral infarction) - Appreciate neurology service following - Continue stroke work up  - Continuing aspirin and warfarin  - PT/OT/SLP eval  - Echocardiogram - Tele monitoring  - Neuro checks  - MRI brain  - carotid dopplers  - CT angio - 1. Distal left P2 occlusion. No definite occipital infarct by CT. 2. 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 3. 50-60% stenosis of the cervical right ICA secondary to atherosclerotic plaque and vessel kinking.   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  - warfarin management per pharmacy   AKI (acute kidney injury) - IVFs, following, likely prerenal component  Alzheimer's disease  Code Status: FULL Family Communication: no family at bedside  HPI/Subjective: Pt reports that vision is slightly improved but not back to baseline  Objective: Filed Vitals:   02/03/13 0600  BP: 155/84  Pulse: 81  Temp: 98.1 F (36.7 C)  Resp: 18   No intake or output data in the 24 hours ending 02/03/13 1000 Filed Weights   02/02/13 1842 02/03/13 0000  Weight: 180 lb (81.647 kg) 160 lb 4.8 oz (72.712 kg)    Exam: GEN: Pleasant patient unable to see. Mucous membranes pink and anicteric; Her pupils  are reactive to light,  no cervical lymphadenopathy nor thyromegaly or carotid bruit; no JVD; There were no stridor. Neck is very supple.  CHEST: Normal respiration, clear to auscultation bilaterally.  HEART: irreg irreg There are no murmur, rub, or gallops.  ABDOMEN: soft and non-tender; no masses, no organomegaly, normal abdominal bowel sounds;  EXTREMITIES:age-appropriate arthropathy of the hands and knees; no edema; no ulcerations. There is no calf tenderness.  CNS: She does respond to bright lights in both eyes. Speech is fluent; uvula elevated with phonation, facial symmetry and tongue midline. Her right side is slightly weaker than her left side.   Data Reviewed: Basic Metabolic Panel:  Recent Labs Lab 02/02/13 1845 02/02/13 1926  NA 139 140  K 5.3 5.2  CL 102 106  CO2 26  --   GLUCOSE 152* 152*  BUN 26* 27*  CREATININE 1.33* 1.40*  CALCIUM 9.8  --    Liver Function Tests:  Recent Labs Lab 02/02/13 1845  AST 22  ALT 11  ALKPHOS 93  BILITOT 0.4  PROT 7.9  ALBUMIN 3.7   No results found for this basename: LIPASE, AMYLASE,  in the last 168 hours No results found for this basename: AMMONIA,  in the last 168 hours CBC:  Recent Labs Lab 02/02/13 1845 02/02/13 1926  WBC 8.8  --   NEUTROABS 6.1  --   HGB 14.1 15.6*  HCT 42.1 46.0  MCV 87.2  --   PLT 184  --    Cardiac Enzymes:  Recent Labs Lab 02/02/13 1845  TROPONINI <0.30  BNP (last 3 results) No results found for this basename: PROBNP,  in the last 8760 hours CBG:  Recent Labs Lab 02/03/13 0756  GLUCAP 140*    No results found for this or any previous visit (from the past 240 hour(s)).   Studies: Ct Angio Head W/cm &/or Wo Cm  02/02/2013   CLINICAL DATA:  Vision changes.  Rule out straw  EXAM: CT ANGIOGRAPHY HEAD AND NECK  TECHNIQUE: Multidetector CT imaging of the head and neck was performed using the standard protocol during bolus administration of intravenous contrast. Multiplanar CT image  reconstructions including MIPs were obtained to evaluate the vascular anatomy. Carotid stenosis measurements (when applicable) are obtained utilizing NASCET criteria, using the distal internal carotid diameter as the denominator.  CONTRAST:  72mL OMNIPAQUE IOHEXOL 350 MG/ML SOLN  COMPARISON:  Head CT from the same day. Brain MRI and MRA 12/09/2008  FINDINGS: CTA HEAD FINDINGS  Posterior circulation: Most notable for abrupt vessel cut off of the distal left P2. Codominant vertebral arteries. No aneurysm or significant stenosis.  Right anterior circulation: Extensive carotid siphon atherosclerosis. No evidence of aneurysm or significant stenosis. No major vessel cut off.  Left anterior circulation: Chronic occlusion of the left ICA to the level of the supraclinoid carotid, were there is reconstitution via the posterior communicating artery. Left A1 is hypoplastic. The left A2 is chronically attenuated. No high-grade stenosis or major vessel occlusion seen more distally.  Remote infarcts in the left frontal lobe and right parasagittal lobe. It is difficult to clearly see infarct in the parasagittal left occipital lobe, although there is likely acute ischemia given the history and angiographic findings. There is also subtle gray-white differentiation loss in this region. No abnormal intracranial enhancement. Patent dural venous sinuses.  Review of the MIP images confirms the above findings.  CTA NECK FINDINGS  Intrathoracic imaging notable for mediastinal and bilateral hilar lymphadenopathy. From chest CT 10/12/2002 report, this is a chronic finding. There is an irregularly marginated 7 mm nodule in the right upper lobe. Apical emphysema.  Two vessel aortic arch. Bilateral vertebral arteries show mild luminal irregularity and mural calcifications, consistent with atherosclerosis. No significant stenosis.  There is extensive calcified and noncalcified plaque throughout the right carotid system, including the common  carotid artery. There is kinking of the right internal carotid artery approximate 2 cm beyond the bifurcation, with kink and atherosclerosis causing 50-60% stenosis. No evidence of ulcerated plaque or dissection.  There is extensive atherosclerosis throughout the left common carotid artery. Chronic occlusion of the left ICA just beyond the bifurcation. The ICA is occluded to the supraclinoid segment, were there is reconstitution as noted below. This finding is chronic based on 2010 MRA.  Critical results were called by telephone at the time of interpretation on 02/02/2013 at 10:59 PM to Dr. Alexis Goodell , who verbally acknowledged these results.  Review of the MIP images confirms the above findings.  IMPRESSION: 1. Distal left P2 occlusion. No definite occipital infarct by CT, MRI MRI could confirm the presence of infarction. 2. 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 3. 50-60% stenosis of the cervical right ICA secondary to atherosclerotic plaque and vessel kinking. 4. 7 mm right upper lobe pulmonary nodule. Recommend chest CT followup in 3 months. 5. Mediastinal and hilar lymphadenopathy, chronic based on reporting from 2004. Is there history of sarcoidosis?   Electronically Signed   By: Jorje Guild M.D.   On: 02/02/2013  23:18   Ct Head Wo Contrast  02/02/2013   CLINICAL DATA:  Vision changes  EXAM: CT HEAD WITHOUT CONTRAST  TECHNIQUE: Contiguous axial images were obtained from the base of the skull through the vertex without intravenous contrast.  COMPARISON:  Prior MRI from 12/09/2008 and prior CT from 12/08/2008  FINDINGS: Wedge-shaped encephalomalacia within the left frontal lobe and right occipital lobe is seen, consistent with remote infarct. Remote left cerebral watershed infarct also present. Additional scattered and confluent hypodensity within the periventricular and deep white matter are most consistent with chronic  microvascular ischemic change. No definite acute large vessel territory infarct. There is no acute intracranial hemorrhage.  No mass lesion or midline shift. No hydrocephalus. There is no extra-axial fluid collection.  The calvarium is intact. Orbits are normal. Paranasal sinuses and mastoid air cells are clear.  IMPRESSION: 1. No acute intracranial abnormality. 2. Remote infarcts involving the left frontal and parietal lobes as well as the right occipital lobe. 3. Atrophy with chronic microvascular ischemic changes.   Electronically Signed   By: Jeannine Boga M.D.   On: 02/02/2013 19:26   Ct Angio Neck W/cm &/or Wo/cm  02/02/2013   CLINICAL DATA:  Vision changes.  Rule out straw  EXAM: CT ANGIOGRAPHY HEAD AND NECK  TECHNIQUE: Multidetector CT imaging of the head and neck was performed using the standard protocol during bolus administration of intravenous contrast. Multiplanar CT image reconstructions including MIPs were obtained to evaluate the vascular anatomy. Carotid stenosis measurements (when applicable) are obtained utilizing NASCET criteria, using the distal internal carotid diameter as the denominator.  CONTRAST:  47mL OMNIPAQUE IOHEXOL 350 MG/ML SOLN  COMPARISON:  Head CT from the same day. Brain MRI and MRA 12/09/2008  FINDINGS: CTA HEAD FINDINGS  Posterior circulation: Most notable for abrupt vessel cut off of the distal left P2. Codominant vertebral arteries. No aneurysm or significant stenosis.  Right anterior circulation: Extensive carotid siphon atherosclerosis. No evidence of aneurysm or significant stenosis. No major vessel cut off.  Left anterior circulation: Chronic occlusion of the left ICA to the level of the supraclinoid carotid, were there is reconstitution via the posterior communicating artery. Left A1 is hypoplastic. The left A2 is chronically attenuated. No high-grade stenosis or major vessel occlusion seen more distally.  Remote infarcts in the left frontal lobe and right  parasagittal lobe. It is difficult to clearly see infarct in the parasagittal left occipital lobe, although there is likely acute ischemia given the history and angiographic findings. There is also subtle gray-white differentiation loss in this region. No abnormal intracranial enhancement. Patent dural venous sinuses.  Review of the MIP images confirms the above findings.  CTA NECK FINDINGS  Intrathoracic imaging notable for mediastinal and bilateral hilar lymphadenopathy. From chest CT 10/12/2002 report, this is a chronic finding. There is an irregularly marginated 7 mm nodule in the right upper lobe. Apical emphysema.  Two vessel aortic arch. Bilateral vertebral arteries show mild luminal irregularity and mural calcifications, consistent with atherosclerosis. No significant stenosis.  There is extensive calcified and noncalcified plaque throughout the right carotid system, including the common carotid artery. There is kinking of the right internal carotid artery approximate 2 cm beyond the bifurcation, with kink and atherosclerosis causing 50-60% stenosis. No evidence of ulcerated plaque or dissection.  There is extensive atherosclerosis throughout the left common carotid artery. Chronic occlusion of the left ICA just beyond the bifurcation. The ICA is occluded to the supraclinoid segment, were there is reconstitution as noted below. This  finding is chronic based on 2010 MRA.  Critical results were called by telephone at the time of interpretation on 02/02/2013 at 10:59 PM to Dr. Alexis Goodell , who verbally acknowledged these results.  Review of the MIP images confirms the above findings.  IMPRESSION: 1. Distal left P2 occlusion. No definite occipital infarct by CT, MRI MRI could confirm the presence of infarction. 2. 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 3. 50-60% stenosis of the cervical right ICA secondary to atherosclerotic  plaque and vessel kinking. 4. 7 mm right upper lobe pulmonary nodule. Recommend chest CT followup in 3 months. 5. Mediastinal and hilar lymphadenopathy, chronic based on reporting from 2004. Is there history of sarcoidosis?   Electronically Signed   By: Jorje Guild M.D.   On: 02/02/2013 23:18    Scheduled Meds: . allopurinol  100 mg Oral Daily  . amLODipine  5 mg Oral Daily  . aspirin  81 mg Oral Daily  . digoxin  0.125 mg Oral Daily  . donepezil  10 mg Oral QHS  . furosemide  20 mg Oral Daily  . insulin aspart  0-9 Units Subcutaneous TID WC  . levETIRAcetam  250 mg Oral BID  . metoprolol succinate  50 mg Oral BID  . oxybutynin  5 mg Oral BID  . potassium chloride  10 mEq Oral Daily  . simvastatin  20 mg Oral Daily  . warfarin  7.5 mg Oral ONCE-1800  . Warfarin - Pharmacist Dosing Inpatient   Does not apply q1800   Continuous Infusions:   Principal Problem:   Stroke Active Problems:   Chronic atrial fibrillation   Hypertension   Hypercholesterolemia   Type 2 diabetes mellitus with diabetic neuropathy   HTN (hypertension), benign   Hyperlipidemia   AKI (acute kidney injury)   Alzheimer's disease   Long term (current) use of anticoagulants   CVA (cerebral infarction)   [employer] [phone]. If 7PM-7AM, please contact night-coverage at [email or web address], password [email or web address] 02/03/2013, 10:00 AM  LOS: 1 day

## 2013-02-04 ENCOUNTER — Encounter (HOSPITAL_COMMUNITY): Payer: Self-pay | Admitting: General Practice

## 2013-02-04 DIAGNOSIS — I69998 Other sequelae following unspecified cerebrovascular disease: Secondary | ICD-10-CM

## 2013-02-04 DIAGNOSIS — I5022 Chronic systolic (congestive) heart failure: Secondary | ICD-10-CM

## 2013-02-04 DIAGNOSIS — I4891 Unspecified atrial fibrillation: Secondary | ICD-10-CM

## 2013-02-04 DIAGNOSIS — H539 Unspecified visual disturbance: Secondary | ICD-10-CM

## 2013-02-04 DIAGNOSIS — I6350 Cerebral infarction due to unspecified occlusion or stenosis of unspecified cerebral artery: Secondary | ICD-10-CM

## 2013-02-04 LAB — GLUCOSE, CAPILLARY
Glucose-Capillary: 147 mg/dL — ABNORMAL HIGH (ref 70–99)
Glucose-Capillary: 118 mg/dL — ABNORMAL HIGH (ref 70–99)
Glucose-Capillary: 188 mg/dL — ABNORMAL HIGH (ref 70–99)
Glucose-Capillary: 143 mg/dL — ABNORMAL HIGH (ref 70–99)

## 2013-02-04 LAB — CBC
HCT: 43.4 % (ref 36.0–46.0)
Hemoglobin: 14.8 g/dL (ref 12.0–15.0)
MCH: 28.9 pg (ref 26.0–34.0)
MCHC: 34.1 g/dL (ref 30.0–36.0)
MCV: 84.8 fL (ref 78.0–100.0)
PLATELETS: 198 10*3/uL (ref 150–400)
RBC: 5.12 MIL/uL — ABNORMAL HIGH (ref 3.87–5.11)
RDW: 14.9 % (ref 11.5–15.5)
WBC: 6.2 10*3/uL (ref 4.0–10.5)

## 2013-02-04 LAB — PROTIME-INR
INR: 1.08 (ref 0.00–1.49)
PROTHROMBIN TIME: 13.8 s (ref 11.6–15.2)

## 2013-02-04 LAB — BASIC METABOLIC PANEL
BUN: 17 mg/dL (ref 6–23)
CALCIUM: 10.1 mg/dL (ref 8.4–10.5)
CO2: 21 mEq/L (ref 19–32)
Chloride: 101 mEq/L (ref 96–112)
Creatinine, Ser: 1 mg/dL (ref 0.50–1.10)
GFR calc Af Amer: 64 mL/min — ABNORMAL LOW (ref >90)
GFR, EST NON AFRICAN AMERICAN: 55 mL/min — AB (ref >90)
Glucose, Bld: 152 mg/dL — ABNORMAL HIGH (ref 70–99)
Potassium: 4.1 mEq/L (ref 3.7–5.3)
SODIUM: 138 meq/L (ref 137–147)

## 2013-02-04 MED ORDER — PNEUMOCOCCAL VAC POLYVALENT 25 MCG/0.5ML IJ INJ
0.5000 mL | INJECTION | INTRAMUSCULAR | Status: AC
  Administered 2013-02-05: 0.5 mL via INTRAMUSCULAR
  Filled 2013-02-04: qty 0.5

## 2013-02-04 MED ORDER — WARFARIN SODIUM 5 MG PO TABS
5.0000 mg | ORAL_TABLET | Freq: Once | ORAL | Status: AC
  Administered 2013-02-04: 5 mg via ORAL
  Filled 2013-02-04: qty 1

## 2013-02-04 MED ORDER — ENALAPRIL MALEATE 2.5 MG PO TABS
2.5000 mg | ORAL_TABLET | Freq: Every day | ORAL | Status: DC
  Administered 2013-02-04 – 2013-02-07 (×4): 2.5 mg via ORAL
  Filled 2013-02-04 (×4): qty 1

## 2013-02-04 NOTE — Progress Notes (Signed)
Patient changed to inpatient r/t + CVA on MRI

## 2013-02-04 NOTE — Progress Notes (Addendum)
[facility] PROGRESS NOTE  Casey [name] IOX:[id number] DOB: 07/17/1940 DOA: 02/02/2013 PCP: Oneal Grout, MD  Assessment/Plan:   Acute CVA- acute left parietal infarcts (embolic) with remote infarcts on MRI - Appreciate neurology service following - Continuing aspirin and warfarin  - PT/OT/SLP eval  - Echocardiogram- without thrombus - Tele monitoring  - Neuro checks  - carotid dopplers  - CT angio - 1. Distal left P2 occlusion. No definite occipital infarct by CT. 2. 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 3. 50-60% stenosis of the cervical right ICA secondary to atherosclerotic plaque and vessel kinking.   Systolic CHF- EF 26-83 % Elevated Pulm pressures as well - currently compensated- stop IVF now.  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  - warfarin management per pharmacy   AKI (acute kidney injury) - resolved with hydration- d/c fluids due to systolic CHF  Alzheimer's disease  Code Status: FULL Family Communication: spoke with son in detail in regards to patient needing 24 hr supervision-   HPI/Subjective: Pt unable to see me clearly. Thinks she is ambulated since admission but does not know for sure- No dyspnea, chest pain or GI symptoms.   Objective: Filed Vitals:   02/04/13 0759  BP: 166/73  Pulse: 63  Temp: 97.4 F (36.3 C)  Resp: 18     Intake/Output Summary (Last 24 hours) at 02/04/13 0959 Last data filed at 02/03/13 1945  Gross per 24 hour  Intake    480 ml  Output      0 ml  Net    480 ml   Filed Weights   02/02/13 1842 02/03/13 0000 02/04/13 0614  Weight: 81.647 kg (180 lb) 72.712 kg (160 lb 4.8 oz) 71.668 kg (158 lb)    Exam: GEN: Pleasant patient unable to see. Mucous membranes pink and anicteric; Her pupils are reactive to light,  no cervical lymphadenopathy nor thyromegaly or carotid bruit; no JVD; There were no stridor. Neck is very supple.  CHEST: Normal respiration, clear to auscultation bilaterally. - 98% on room air HEART: irreg irreg There are no murmur, rub, or gallops.  ABDOMEN: soft and non-tender; no masses, no organomegaly, normal abdominal bowel sounds;  EXTREMITIES:age-appropriate arthropathy of the hands and knees; no edema; no ulcerations. There is no calf tenderness.  CNS: She does respond to bright lights in both eyes. Speech is fluent; uvula elevated with phonation, facial symmetry and tongue midline. Her right side is slightly weaker than her left side.   Data Reviewed: Basic Metabolic Panel:  Recent Labs Lab 02/02/13 1845 02/02/13 1926 02/04/13 0535  NA 139 140 138  K 5.3 5.2 4.1  CL 102 106 101  CO2 26  --  21  GLUCOSE 152* 152* 152*  BUN 26* 27* 17  CREATININE 1.33* 1.40* 1.00  CALCIUM 9.8  --  10.1   Liver Function Tests:  Recent Labs Lab 02/02/13  1845  AST 22  ALT 11  ALKPHOS 93  BILITOT 0.4  PROT 7.9  ALBUMIN 3.7   No results found for this basename: LIPASE, AMYLASE,  in the last 168 hours No results found for this basename: AMMONIA,  in the last 168 hours CBC:  Recent Labs Lab 02/02/13 1845 02/02/13 1926 02/04/13 0535  WBC 8.8  --  6.2  NEUTROABS 6.1  --   --   HGB 14.1 15.6* 14.8  HCT 42.1 46.0 43.4  MCV 87.2  --  84.8  PLT 184  --  198   Cardiac Enzymes:  Recent Labs Lab 02/02/13 1845  TROPONINI <0.30   BNP (last 3 results) No results  found for this basename: PROBNP,  in the last 8760 hours CBG:  Recent Labs Lab 02/03/13 0756 02/03/13 1130 02/03/13 1712 02/03/13 2105 02/04/13 0757  GLUCAP 140* 128* 122* 187* 147*    No results found for this or any previous visit (from the past 240 hour(s)).   Studies: Ct Angio Head W/cm &/or Wo Cm  02/02/2013   CLINICAL DATA:  Vision changes.  Rule out straw  EXAM: CT ANGIOGRAPHY HEAD AND NECK  TECHNIQUE: Multidetector CT imaging of the head and neck was performed using the standard protocol during bolus administration of intravenous contrast. Multiplanar CT image reconstructions including MIPs were obtained to evaluate the vascular anatomy. Carotid stenosis measurements (when applicable) are obtained utilizing NASCET criteria, using the distal internal carotid diameter as the denominator.  CONTRAST:  38mL OMNIPAQUE IOHEXOL 350 MG/ML SOLN  COMPARISON:  Head CT from the same day. Brain MRI and MRA 12/09/2008  FINDINGS: CTA HEAD FINDINGS  Posterior circulation: Most notable for abrupt vessel cut off of the distal left P2. Codominant vertebral arteries. No aneurysm or significant stenosis.  Right anterior circulation: Extensive carotid siphon atherosclerosis. No evidence of aneurysm or significant stenosis. No major vessel cut off.  Left anterior circulation: Chronic occlusion of the left ICA to the level of the supraclinoid carotid, were there is reconstitution via the posterior communicating artery. Left A1 is hypoplastic. The left A2 is chronically attenuated. No high-grade stenosis or major vessel occlusion seen more distally.  Remote infarcts in the left frontal lobe and right parasagittal lobe. It is difficult to clearly see infarct in the parasagittal left occipital lobe, although there is likely acute ischemia given the history and angiographic findings. There is also subtle gray-white differentiation loss in this region. No abnormal intracranial enhancement. Patent dural venous sinuses.   Review of the MIP images confirms the above findings.  CTA NECK FINDINGS  Intrathoracic imaging notable for mediastinal and bilateral hilar lymphadenopathy. From chest CT 10/12/2002 report, this is a chronic finding. There is an irregularly marginated 7 mm nodule in the right upper lobe. Apical emphysema.  Two vessel aortic arch. Bilateral vertebral arteries show mild luminal irregularity and mural calcifications, consistent with atherosclerosis. No significant stenosis.  There is extensive calcified and noncalcified plaque throughout the right carotid system, including the common carotid artery. There is kinking of the right internal carotid artery approximate 2 cm beyond the bifurcation, with kink and atherosclerosis causing 50-60% stenosis. No evidence of ulcerated plaque or dissection.  There is extensive atherosclerosis throughout the left common carotid artery. Chronic occlusion of the left ICA just beyond the bifurcation. The ICA is occluded to the supraclinoid segment, were there is reconstitution as noted below. This finding is chronic based on 2010 MRA.  Critical results were called by telephone at the time of  interpretation on 02/02/2013 at 10:59 PM to Dr. Alexis Goodell , who verbally acknowledged these results.  Review of the MIP images confirms the above findings.  IMPRESSION: 1. Distal left P2 occlusion. No definite occipital infarct by CT, MRI MRI could confirm the presence of infarction. 2. 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 3. 50-60% stenosis of the cervical right ICA secondary to atherosclerotic plaque and vessel kinking. 4. 7 mm right upper lobe pulmonary nodule. Recommend chest CT followup in 3 months. 5. Mediastinal and hilar lymphadenopathy, chronic based on reporting from 2004. Is there history of sarcoidosis?   Electronically Signed   By: Jorje Guild M.D.   On: 02/02/2013 23:18   Ct Head Wo  Contrast  02/02/2013   CLINICAL DATA:  Vision changes  EXAM: CT HEAD WITHOUT CONTRAST  TECHNIQUE: Contiguous axial images were obtained from the base of the skull through the vertex without intravenous contrast.  COMPARISON:  Prior MRI from 12/09/2008 and prior CT from 12/08/2008  FINDINGS: Wedge-shaped encephalomalacia within the left frontal lobe and right occipital lobe is seen, consistent with remote infarct. Remote left cerebral watershed infarct also present. Additional scattered and confluent hypodensity within the periventricular and deep white matter are most consistent with chronic microvascular ischemic change. No definite acute large vessel territory infarct. There is no acute intracranial hemorrhage.  No mass lesion or midline shift. No hydrocephalus. There is no extra-axial fluid collection.  The calvarium is intact. Orbits are normal. Paranasal sinuses and mastoid air cells are clear.  IMPRESSION: 1. No acute intracranial abnormality. 2. Remote infarcts involving the left frontal and parietal lobes as well as the right occipital lobe. 3. Atrophy with chronic microvascular ischemic changes.   Electronically Signed   By: Jeannine Boga M.D.   On: 02/02/2013 19:26   Ct Angio Neck W/cm &/or Wo/cm  02/02/2013   CLINICAL DATA:  Vision changes.  Rule out straw  EXAM: CT ANGIOGRAPHY HEAD AND NECK  TECHNIQUE: Multidetector CT imaging of the head and neck was performed using the standard protocol during bolus administration of intravenous contrast. Multiplanar CT image reconstructions including MIPs were obtained to evaluate the vascular anatomy. Carotid stenosis measurements (when applicable) are obtained utilizing NASCET criteria, using the distal internal carotid diameter as the denominator.  CONTRAST:  43mL OMNIPAQUE IOHEXOL 350 MG/ML SOLN  COMPARISON:  Head CT from the same day. Brain MRI and MRA 12/09/2008  FINDINGS: CTA HEAD FINDINGS  Posterior circulation: Most notable for abrupt vessel cut off  of the distal left P2. Codominant vertebral arteries. No aneurysm or significant stenosis.  Right anterior circulation: Extensive carotid siphon atherosclerosis. No evidence of aneurysm or significant stenosis. No major vessel cut off.  Left anterior circulation: Chronic occlusion of the left ICA to the level of the supraclinoid carotid, were there is reconstitution via the posterior communicating artery. Left A1 is hypoplastic. The left A2 is chronically attenuated. No high-grade stenosis or major vessel occlusion seen more distally.  Remote infarcts in the left frontal lobe and right parasagittal lobe. It is difficult to clearly see infarct in the parasagittal left occipital lobe, although there is likely acute ischemia given the history and angiographic findings. There is also subtle gray-white differentiation loss in this region. No abnormal intracranial enhancement. Patent dural venous sinuses.  Review of the MIP images confirms the above findings.  CTA NECK FINDINGS  Intrathoracic imaging notable for mediastinal and bilateral hilar lymphadenopathy. From chest CT 10/12/2002  report, this is a chronic finding. There is an irregularly marginated 7 mm nodule in the right upper lobe. Apical emphysema.  Two vessel aortic arch. Bilateral vertebral arteries show mild luminal irregularity and mural calcifications, consistent with atherosclerosis. No significant stenosis.  There is extensive calcified and noncalcified plaque throughout the right carotid system, including the common carotid artery. There is kinking of the right internal carotid artery approximate 2 cm beyond the bifurcation, with kink and atherosclerosis causing 50-60% stenosis. No evidence of ulcerated plaque or dissection.  There is extensive atherosclerosis throughout the left common carotid artery. Chronic occlusion of the left ICA just beyond the bifurcation. The ICA is occluded to the supraclinoid segment, were there is reconstitution as noted  below. This finding is chronic based on 2010 MRA.  Critical results were called by telephone at the time of interpretation on 02/02/2013 at 10:59 PM to Dr. Alexis Goodell , who verbally acknowledged these results.  Review of the MIP images confirms the above findings.  IMPRESSION: 1. Distal left P2 occlusion. No definite occipital infarct by CT, MRI MRI could confirm the presence of infarction. 2. 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 3. 50-60% stenosis of the cervical right ICA secondary to atherosclerotic plaque and vessel kinking. 4. 7 mm right upper lobe pulmonary nodule. Recommend chest CT followup in 3 months. 5. Mediastinal and hilar lymphadenopathy, chronic based on reporting from 2004. Is there history of sarcoidosis?   Electronically Signed   By: Jorje Guild M.D.   On: 02/02/2013 23:18   Mr Brain Wo Contrast  02/03/2013   CLINICAL DATA:  Vision changes.  Rule out stroke.  EXAM: MRI HEAD WITHOUT CONTRAST  TECHNIQUE: Multiplanar, multiecho pulse sequences of the brain and surrounding structures were obtained without intravenous contrast.  COMPARISON:  Head CT 02/02/2013 and brain MRI 12/09/2008  FINDINGS: There is a 3.9 cm focus of acute infarct involving the medial left occipital lobe with extension into the posterior medial left parietal lobe. There are also 2 subcentimeter foci of acute infarct involving the left thalamus. Left frontoparietal encephalomalacia and right occipital encephalomalacia are consistent with remote infarcts. Incidental note is made of a cavum septum pellucidum et vergae. There is ex vacuo dilatation of the occipital horn of the right lateral ventricle. There is no midline shift, mass, intracranial hemorrhage, or extra-axial fluid collection. Absent intracranial left ICA flow void is consistent with known occlusion. Orbits are unremarkable. Paranasal sinuses and mastoid air cells are clear.   IMPRESSION: 1. Acute left PCA infarcts involving the left parieto-occipital region and left thalamus. 2. Remote left MCA and right PCA infarcts.   Electronically Signed   By: Logan Bores   On: 02/03/2013 19:54    Scheduled Meds: . allopurinol  100 mg Oral Daily  . amLODipine  5 mg Oral Daily  . aspirin  81 mg Oral Daily  . digoxin  0.125 mg Oral Daily  . donepezil  10 mg Oral QHS  . [START ON 02/05/2013] furosemide  20 mg Oral Daily  . insulin aspart  0-9 Units Subcutaneous TID WC  . levETIRAcetam  250 mg Oral BID  . metoprolol succinate  50 mg Oral BID  . oxybutynin  5 mg Oral BID  . potassium chloride  10 mEq Oral Daily  . simvastatin  20 mg Oral Daily  . Warfarin - Pharmacist Dosing Inpatient   Does not apply q1800   Continuous Infusions:   Principal  Problem:   Stroke Active Problems:   Chronic atrial fibrillation   Hypertension   Hypercholesterolemia   Type 2 diabetes mellitus with diabetic neuropathy   HTN (hypertension), benign   Hyperlipidemia   AKI (acute kidney injury)   Alzheimer's disease   Long term (current) use of anticoagulants   CVA (cerebral infarction)   Debbe Odea, MD  [facility] Pager [phone]. If 7PM-7AM, please contact night-coverage at [email or web address], password [facility] 02/04/2013, 9:59 AM  LOS: 2 days

## 2013-02-04 NOTE — Progress Notes (Signed)
CSW (Clinical Education officer, museum) aware of PT recommendation for SNF. At this time pt is disoriented and CSW unable to reach family (have tried all numbers available on facesheet). CSW to continue to try to complete assessment with pt or pt family.  Casey [name], Casey [name]

## 2013-02-04 NOTE — Progress Notes (Addendum)
Clinical Social Work Department BRIEF PSYCHOSOCIAL ASSESSMENT 02/04/2013  Patient:  Casey [name], Casey [name]     Account Number:  [id number]     Admit date:  02/02/2013  Clinical Social Worker:  Adair Laundry  Date/Time:  02/04/2013 11:30 AM  Referred by:  Physician  Date Referred:  02/04/2013 Referred for  SNF Placement   Other Referral:   Interview type:  Family Other interview type:   CSW spoke to pt son over the phone    PSYCHOSOCIAL DATA Living Status:  ALONE Admitted from facility:   Level of care:   Primary support name:  Casey [name] [phone] Primary support relationship to patient:  CHILD, ADULT Degree of support available:   Pt has supportive family    CURRENT CONCERNS Current Concerns  Post-Acute Placement   Other Concerns:    SOCIAL WORK ASSESSMENT / PLAN CSW informed pt resides alone and will need SNF pt. CSW reviewed pt chart and aware that pt does not currently have a qualifying stay for medicare to cover SNF. CSW spoke to pt son over the phone and explained PT recommendation along with Medicare guidelines. Pt son explained he was agreeable to SNF however private pay is not an option. CSW explained that at this time insurance coverage was not possible. CSW did explain that pt was switched to inpatient today but it is not anticipated that pt will need to remain in the hospital long enough to receive 3 night qualifying stay. Pt son unsure of what dc plan will have to be now. Pt son lives in [place] and is on his way to the hospital at this time. CSW to meet with pt son at 2:00pm to discuss alternative options for dc. Pt son also wanting to speak with MD. CSW paged MD to notify.  ADDENDUM 2:30pm: CSW stopped by pt room but pt son had not yet arrived. CSW unable to reschedule meeting time for today. CSW explained this to pt grandchildren who were in the room. CSW also explained that at this time pt family will need to arrange for family or paid caregivers to be  with pt at home as SNF is not an option (pt son ruled out private pay). Pt grandchildren understand. CSW left contact information with family and informed CSW could be available to speak by phone if pt son has questions. CSW also spoke with RN CM and notified.   Assessment/plan status:  Psychosocial Support/Ongoing Assessment of Needs Other assessment/ plan:   Information/referral to community resources:   Medicare qualifying stay information; may provide SNF list at a later time if needed    PATIENT'S/FAMILY'S RESPONSE TO PLAN OF CARE: Pt son unsure of dc plan at this time       [place], [place]

## 2013-02-04 NOTE — Progress Notes (Signed)
OT Cancellation Note  Patient Details Name: Casey [name] MRN: [id number] DOB: [birth year]   Cancelled Treatment:    Reason Eval/Treat Not Completed: Patient at procedure or test/ unavailable (having doppler) Will see in am [place], OTR/L  [phone] 02/04/2013 02/04/2013, 5:25 PM

## 2013-02-04 NOTE — Progress Notes (Signed)
The Chaplain offered emotional and spiritual support to the patient today. The patient was very responsive to the Chaplain in their dialogue together. The patient said "she has a long way to go in her health recovery but she is glad she is able to see another day of life." The Chaplain prayed with the patient and her family to conclude their time together in the visit.  Chaplain Clista Bernhardt Tal Kempker

## 2013-02-04 NOTE — Progress Notes (Signed)
Stroke Team Progress Note  HISTORY Casey [name] is an 73 y.o. female with a history of stroke who presents today 02/02/2013 with vision loss. The patient is a poor historian. It seems that prior to this event she had difficulty seeing to one side but is unable to tell me which side. The patient reports that 1-2 days ago she began to feel that she was gradually losing her vision. She reports that today she was unable to see at all. It seems that at some point in time she was able to contact friends who called EMS. Patient was brought in for evaluation.  Patient was not a TPA candidate secondary to inability to determine LKW, On Coumadin. She was admitted for further evaluation and treatment.  SUBJECTIVE Patient sitting on edge of bed. Dr. Leonie Man spoke with her about her blindness and recommendations for supervised living at discharge. Pt agrees she cannot see. Expresses displeasure with plans for supervised living.  OBJECTIVE Most recent Vital Signs: Filed Vitals:   02/03/13 1600 02/03/13 2100 02/04/13 0614 02/04/13 0759  BP: 138/81 155/88 130/65 166/73  Pulse: 81 84 77 63  Temp:  98 F (36.7 C) 98.5 F (36.9 C) 97.4 F (36.3 C)  TempSrc:  Oral Oral Oral  Resp:  18  18  Height:      Weight:   71.668 kg (158 lb)   SpO2:  95% 98% 98%   CBG (last 3)   Recent Labs  02/03/13 1712 02/03/13 2105 02/04/13 0757  GLUCAP 122* 187* 147*    IV Fluid Intake:   . sodium chloride 50 mL/hr at 02/03/13 1048    MEDICATIONS  . allopurinol  100 mg Oral Daily  . amLODipine  5 mg Oral Daily  . aspirin  81 mg Oral Daily  . digoxin  0.125 mg Oral Daily  . donepezil  10 mg Oral QHS  . [START ON 02/05/2013] furosemide  20 mg Oral Daily  . insulin aspart  0-9 Units Subcutaneous TID WC  . levETIRAcetam  250 mg Oral BID  . metoprolol succinate  50 mg Oral BID  . oxybutynin  5 mg Oral BID  . potassium chloride  10 mEq Oral Daily  . simvastatin  20 mg Oral Daily  . Warfarin - Pharmacist Dosing  Inpatient   Does not apply q1800   PRN:  traMADol  Diet:  Carb Control thin liquids Activity:   Bathroom privileges with assistance DVT Prophylaxis:  warfarin  CLINICALLY SIGNIFICANT STUDIES Basic Metabolic Panel:   Recent Labs Lab 02/02/13 1845 02/02/13 1926 02/04/13 0535  NA 139 140 138  K 5.3 5.2 4.1  CL 102 106 101  CO2 26  --  21  GLUCOSE 152* 152* 152*  BUN 26* 27* 17  CREATININE 1.33* 1.40* 1.00  CALCIUM 9.8  --  10.1   Liver Function Tests:   Recent Labs Lab 02/02/13 1845  AST 22  ALT 11  ALKPHOS 93  BILITOT 0.4  PROT 7.9  ALBUMIN 3.7   CBC:   Recent Labs Lab 02/02/13 1845 02/02/13 1926 02/04/13 0535  WBC 8.8  --  6.2  NEUTROABS 6.1  --   --   HGB 14.1 15.6* 14.8  HCT 42.1 46.0 43.4  MCV 87.2  --  84.8  PLT 184  --  198   Coagulation:   Recent Labs Lab 02/02/13 1845 02/03/13 0500 02/04/13 0535  LABPROT 12.7 13.3 13.8  INR 0.97 1.03 1.08   Cardiac Enzymes:   Recent  Labs Lab 02/02/13 1845  TROPONINI <0.30   Urinalysis:   Recent Labs Lab 02/02/13 2008  COLORURINE YELLOW  LABSPEC 1.008  PHURINE 5.5  GLUCOSEU NEGATIVE  HGBUR NEGATIVE  BILIRUBINUR NEGATIVE  KETONESUR NEGATIVE  PROTEINUR NEGATIVE  UROBILINOGEN 0.2  NITRITE NEGATIVE  LEUKOCYTESUR NEGATIVE   Lipid Panel    Component Value Date/Time   CHOL 192 02/03/2013 0500   TRIG 156* 02/03/2013 0500   HDL 72 02/03/2013 0500   CHOLHDL 2.7 02/03/2013 0500   VLDL 31 02/03/2013 0500   LDLCALC 89 02/03/2013 0500   HgbA1C  Lab Results  Component Value Date   HGBA1C 7.0* 02/03/2013    Urine Drug Screen:     Component Value Date/Time   LABOPIA NONE DETECTED 02/02/2013 2008   COCAINSCRNUR NONE DETECTED 02/02/2013 2008   LABBENZ NONE DETECTED 02/02/2013 2008   AMPHETMU NONE DETECTED 02/02/2013 2008   THCU NONE DETECTED 02/02/2013 2008   LABBARB NONE DETECTED 02/02/2013 2008    Alcohol Level:   Recent Labs Lab 02/02/13 1845  ETH <11     CT of the brain  02/02/2013    1. No acute  intracranial abnormality. 2. Remote infarcts involving the left frontal and parietal lobes as well as the right occipital lobe. 3. Atrophy with chronic microvascular ischemic changes.     CT of the brain and neck  02/02/2013    1. Distal left P2 occlusion. No definite occipital infarct by CT. 2. 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 3. 50-60% stenosis of the cervical right ICA secondary to atherosclerotic plaque and vessel kinking. 4. 7 mm right upper lobe pulmonary nodule. Recommend chest CT followup in 3 months. 5. Mediastinal and hilar lymphadenopathy, chronic based on reporting from 2004.    MRI of the brain 02/04/2012  1. Acute left PCA infarcts involving the left parieto-occipital region and left thalamus.  2. Remote left MCA and right PCA infarcts.   2D Echocardiogram  EF 35-40% with no source of embolus. Diffuse hypokinesis. Mild MR. Atrium mildly dilated.  EKG  atrial fibrillation, rate 86.   Therapy Recommendations   Physical Exam   Frail elderly African American lady currently not in distress.Awake alert. Afebrile. Head is nontraumatic. Neck is supple without bruit. Hearing is normal. Cardiac exam no murmur or gallop. Lungs are clear to auscultation. Distal pulses are well felt. Neurological Exam : Awake alert oriented x2. Diminished attention registration and recall. No aphasia apraxia dysarthria. Slight left gaze preference but will follow gaze not erections. 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 Pupils are 4 mm equal and reactive to light. Fundi were not visualized. Mild left lower facial asymmetry. Tongue is midline. Motor system exam symmetric upper and lower distant no focal weakness. Moves all 4 extremities well against gravity. Sensation appears preserved bilaterally. Plantars are downgoing. Gait was not tested.  ASSESSMENT Casey [name] is  a 73 y.o. female presenting with loss of vision.  CT does not confirm a new stroke, though based on sx and left P2 occlusion. MRI confirms acute left PCA infarcts not seen on CT. Infarcts embolic secondary to known atrial fibrillation. This new stroke in the setting of an old right and left PCA infarcts that would have resulted in a preadmission field cut. Now, with bilateral PCA infarcts, pt has resulting cortical blindness. On aspirin 81 mg orally every day and warfarin  prior to admission; INR on admission was subtherapeutic at 0.97.  Now on aspirin 81 mg orally every day and warfarin for secondary stroke prevention. Patient with resultant cortical blindness - she is aware of blindness; it is not expected to improve. Work up completed.  atrial fibrillation - on coumadin PTA, were considering change to xarelto prior to admission Hyperlipidemia, LDL 89, on zocor 20 PTA, now on zocor 20, goal LDL < 100 (< 70 for diabetics) Hx stroke Hx CLL PVD CHF Renal insufficiency, Cr. 1.4  Hospital day # 2  TREATMENT/PLAN  Continue aspirin 81 mg orally every day and warfarin for secondary stroke prevention. Given Creatinine, would be cautious with NOAC.  onging therapies - will likely need SNF at discharge as she is unable to see and lived alone prior to admission  No further stroke workup indicated. Patient has a 10-15% risk of having another stroke over the next year, the highest risk is within 2 weeks of the most recent stroke/TIA (risk of having a stroke following a stroke or TIA is the same). Ongoing risk factor control by Primary Care Physician Stroke Service will sign off. Please call should any needs arise. Follow up with Dr. Leonie Man, [facility], in 2 months.   Burnetta Sabin, MSN, RN, ANVP-BC, ANP-BC, [facility] Pager: [phone] 02/04/2013 9:29 AM  I have personally obtained a history, examined the patient, evaluated imaging results, and formulated the assessment and plan  of care. I agree with the above.  Antony Contras, MD

## 2013-02-04 NOTE — Progress Notes (Signed)
ANTICOAGULATION CONSULT NOTE - Follow Up Consult  Pharmacy Consult for Coumadin Indication: atrial fibrillation, stroke  Allergies  Allergen Reactions  . Penicillins Other (See Comments)    Patient states that she was told previously by a doctor to not take this medication but is unsure why.     Patient Measurements: Height: 5\' 5"  (165.1 cm) Weight: 158 lb (71.668 kg) IBW/kg (Calculated) : 57  Vital Signs: Temp: 97.3 F (36.3 C) (01/07 1140) Temp src: Oral (01/07 1140) BP: 154/93 mmHg (01/07 1140) Pulse Rate: 62 (01/07 1140)  Labs:  Recent Labs  02/02/13 1845 02/02/13 1926 02/03/13 0500 02/04/13 0535  HGB 14.1 15.6*  --  14.8  HCT 42.1 46.0  --  43.4  PLT 184  --   --  198  APTT 28  --   --   --   LABPROT 12.7  --  13.3 13.8  INR 0.97  --  1.03 1.08  CREATININE 1.33* 1.40*  --  1.00  TROPONINI <0.30  --   --   --     Estimated Creatinine Clearance: 50.5 ml/min (by C-G formula based on Cr of 1).  Assessment: 73 y/o female on chronic Coumadin for Afib and hx stroke who presents with vision loss likely due to a new stroke. INR was at baseline on admission likely due to noncompliance. INR is subtherapeutic today at 1.08. No bleeding noted, CBC wnl.  Goal of Therapy:  INR 2-3 Monitor platelets by anticoagulation protocol: Yes   Plan:  -Coumadin 5 mg PO x1 tonight -INR daily -Monitor for s/sx bleeding  [facility], Pharm.D., BCPS Clinical Pharmacist Pager: [phone] 02/04/2013 12:57 PM

## 2013-02-04 NOTE — Progress Notes (Signed)
*  PRELIMINARY RESULTS* Vascular Ultrasound Carotid Duplex (Doppler) has been completed.  Preliminary findings: Right = 1-39% ICA stenosis. Moderate plaque noted throughout carotid system. Left = occluded ICA due to homogenous plaque vs thrombus. Antegrade vertebral flow bilaterally.    Landry Mellow, RDMS, RVT  02/04/2013, 3:12 PM

## 2013-02-05 DIAGNOSIS — I6350 Cerebral infarction due to unspecified occlusion or stenosis of unspecified cerebral artery: Principal | ICD-10-CM

## 2013-02-05 LAB — GLUCOSE, CAPILLARY
GLUCOSE-CAPILLARY: 140 mg/dL — AB (ref 70–99)
GLUCOSE-CAPILLARY: 163 mg/dL — AB (ref 70–99)
Glucose-Capillary: 135 mg/dL — ABNORMAL HIGH (ref 70–99)
Glucose-Capillary: 134 mg/dL — ABNORMAL HIGH (ref 70–99)

## 2013-02-05 LAB — PROTIME-INR
INR: 1.26 (ref 0.00–1.49)
Prothrombin Time: 15.5 seconds — ABNORMAL HIGH (ref 11.6–15.2)

## 2013-02-05 MED ORDER — WARFARIN SODIUM 5 MG PO TABS
5.0000 mg | ORAL_TABLET | Freq: Once | ORAL | Status: AC
  Administered 2013-02-05: 5 mg via ORAL
  Filled 2013-02-05: qty 1

## 2013-02-05 NOTE — Care Management Note (Signed)
    Page 1 of 2   02/05/2013     3:11:42 PM   CARE MANAGEMENT NOTE 02/05/2013  Patient:  Casey [name], Casey [name]   Account Number:  [id number]  Date Initiated:  02/05/2013  Documentation initiated by:  Marvetta Gibbons  Subjective/Objective Assessment:   Pt admitted with visual changes- MRI + for CVA     Action/Plan:   PTA pt lived alone at a senior living apartment.   Anticipated DC Date:  02/07/2013   Anticipated DC Plan:  SKILLED NURSING FACILITY  In-house referral  Clinical Social Worker      DC Planning Services  CM consult      Choice offered to / List presented to:             Status of service:  In process, will continue to follow Medicare Important Message given?   (If response is "NO", the following Medicare IM given date fields will be blank) Date Medicare IM given:   Date Additional Medicare IM given:    Discharge Disposition:    Per UR Regulation:  Reviewed for med. necessity/level of care/duration of stay  If discussed at Organ of Stay Meetings, dates discussed:    Comments:  02/05/13- 1445- Marvetta Gibbons RN, BSN [phone] Spoke with rounding MD- pt is not ready for d/c today- INR continues to be subtherapeutic- pharmacy adjusting Coumadin- pt would be unable to do Lovenox due to blindness- and family unable to assist. - per MD pt will need SNF- have left message with CSW to f/u with pt's son regarding placement.  02/04/13- 1530- Marvetta Gibbons RN, BSN [phone] Pt admitted as obs- now INPT as of 02/04/13- PT recommending SNF- but unsure if pt will have a 3 night qualifying stay- CSW and CM have spoken to pt's son to explain Medicare guidelines regarding this.  This CM in to speak to pt's son Casey [name]- who per conversation stays both here in [place] and [place]-  rotating per his work schedule. His niece lives here whom he stays with when he is in [place]. Sister also lives in [place]. Pt's son explains that pt lives in a senior living community that does not allow  anyone to stay with her overnight- pt could have [facility] services to assist- but family could not offer 24/7 assist due to restrictions where she lives- per son there is no family that pt could go stay with. Pt is not a CIR candidate and needs SNF placement - verified contact numbers with son Casey [name].

## 2013-02-05 NOTE — Progress Notes (Signed)
PATIENT DETAILS Name: Casey [name] Age: 73 y.o. Sex: female Date of Birth: 03-11-1940 Admit Date: 02/02/2013 Admitting Physician Orvan Falconer, MD OFB:PZWCHE, [facility], MD  Subjective: Unable to see clearly. Denies major complaints.  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 - Has history of atrial fibrillation, on Coumadin. Coumadin dosing by pharmacy, INR continues to be subtherapeutic.  -Continues to be on aspirin as well. Continue statins - Echocardiogram-shows EF around 35-40% with diffuse hypokinesis. - Carotid Doppler shows occluded left internal carotid artery, borderline more than 40% stenosis in the right internal carotid artery. CTA neck shows chronic left ICA occlusion with reconstitution via the left posterior communicating artery. - Neurology was consulted and have subsequently signed off.  Cortical blindness - As above   Chronic systolic heart failure - Clinically compensated, continue with Lasix and enalapril. Also on metoprolol.  Incidental 7 mm right upper lobe pulmonary nodule  - Pt will need repeat CT chest in 3 months   Type 2 diabetes mellitus with diabetic neuropathy  - A1c- 7.0  - CBGs stable with SSI  Hyperlipidemia  - optimally controlled  - resume statin therapy  Hypertension - Continue with amlodipine, metoprolol and enalapril.  Atrial fibrillation - Coumadin per pharmacy-still subtherapeutic INR - On metoprolol and digoxin for rate control  History of Alzheimer's disease - Continue Aricept  History of seizures - Continue Keppra  Disposition: Remain inpatient  DVT Prophylaxis:  not needed as on Coumadin  Code Status: Full code  Family Communication  none at bedside today  Procedures:  None  CONSULTS:  neurology   MEDICATIONS: Scheduled Meds: . allopurinol  100 mg  Oral Daily  . amLODipine  5 mg Oral Daily  . aspirin  81 mg Oral Daily  . digoxin  0.125 mg Oral Daily  . donepezil  10 mg Oral QHS  . enalapril  2.5 mg Oral Daily  . furosemide  20 mg Oral Daily  . insulin aspart  0-9 Units Subcutaneous TID WC  . levETIRAcetam  250 mg Oral BID  . metoprolol succinate  50 mg Oral BID  . oxybutynin  5 mg Oral BID  . potassium chloride  10 mEq Oral Daily  . simvastatin  20 mg Oral Daily  . warfarin  5 mg Oral ONCE-1800  . Warfarin - Pharmacist Dosing Inpatient   Does not apply q1800   Continuous Infusions:  PRN Meds:.traMADol  Antibiotics: Anti-infectives   None       PHYSICAL EXAM: Vital signs in last 24 hours: Filed Vitals:   02/05/13 0335 02/05/13 0807 02/05/13 0956 02/05/13 1154  BP: 125/65 154/75 118/65 121/85  Pulse:  67 61 78  Temp: 97.8 F (36.6 C) 97.9 F (36.6 C)  97.6 F (36.4 C)  TempSrc: Oral Oral  Oral  Resp: 20 18  18   Height:      Weight: 70.761 kg (156 lb)     SpO2:  96%  97%    Weight change: -0.907 kg (-2 lb) Filed Weights   02/03/13 0000 02/04/13 0614 02/05/13 0335  Weight: 72.712 kg (160 lb 4.8 oz) 71.668 kg (158 lb) 70.761 kg (156 lb)   Body mass index is 25.96 kg/(m^2).   Gen Exam: Mostly Awake and alert with clear speech.   Neck: Supple, No JVD.   Chest: B/L Clear.   CVS: S1 S2 Regular, no  murmurs.  Abdomen: soft, BS +, non tender, non distended.  Extremities: no edema, lower extremities warm to touch. Neurologic: Moving all 4 extremities   Skin: No Rash.   Wounds: N/A.    Intake/Output from previous day:  Intake/Output Summary (Last 24 hours) at 02/05/13 1403 Last data filed at 02/05/13 0820  Gross per 24 hour  Intake    506 ml  Output      0 ml  Net    506 ml     LAB RESULTS: CBC  Recent Labs Lab 02/02/13 1845 02/02/13 1926 02/04/13 0535  WBC 8.8  --  6.2  HGB 14.1 15.6* 14.8  HCT 42.1 46.0 43.4  PLT 184  --  198  MCV 87.2  --  84.8  MCH 29.2  --  28.9  MCHC 33.5  --  34.1   RDW 15.2  --  14.9  LYMPHSABS 1.8  --   --   MONOABS 0.6  --   --   EOSABS 0.3  --   --   BASOSABS 0.1  --   --     Chemistries   Recent Labs Lab 02/02/13 1845 02/02/13 1926 02/04/13 0535  NA 139 140 138  K 5.3 5.2 4.1  CL 102 106 101  CO2 26  --  21  GLUCOSE 152* 152* 152*  BUN 26* 27* 17  CREATININE 1.33* 1.40* 1.00  CALCIUM 9.8  --  10.1    CBG:  Recent Labs Lab 02/04/13 1137 02/04/13 1637 02/04/13 2014 02/05/13 0808 02/05/13 1140  GLUCAP 118* 188* 143* 135* 140*    GFR Estimated Creatinine Clearance: 50.2 ml/min (by C-G formula based on Cr of 1).  Coagulation profile  Recent Labs Lab 02/02/13 1845 02/03/13 0500 02/04/13 0535 02/05/13 0520  INR 0.97 1.03 1.08 1.26    Cardiac Enzymes  Recent Labs Lab 02/02/13 1845  TROPONINI <0.30    No components found with this basename: POCBNP,  No results found for this basename: DDIMER,  in the last 72 hours  Recent Labs  02/03/13 0500  HGBA1C 7.0*    Recent Labs  02/03/13 0500  CHOL 192  HDL 72  LDLCALC 89  TRIG 156*  CHOLHDL 2.7   No results found for this basename: TSH, T4TOTAL, FREET3, T3FREE, THYROIDAB,  in the last 72 hours No results found for this basename: VITAMINB12, FOLATE, FERRITIN, TIBC, IRON, RETICCTPCT,  in the last 72 hours No results found for this basename: LIPASE, AMYLASE,  in the last 72 hours  Urine Studies No results found for this basename: UACOL, UAPR, USPG, UPH, UTP, UGL, UKET, UBIL, UHGB, UNIT, UROB, ULEU, UEPI, UWBC, URBC, UBAC, CAST, CRYS, UCOM, BILUA,  in the last 72 hours  MICROBIOLOGY: No results found for this or any previous visit (from the past 240 hour(s)).  RADIOLOGY STUDIES/RESULTS: Ct Angio Head W/cm &/or Wo Cm  02/02/2013   CLINICAL DATA:  Vision changes.  Rule out straw  EXAM: CT ANGIOGRAPHY HEAD AND NECK  TECHNIQUE: Multidetector CT imaging of the head and neck was performed using the standard protocol during bolus administration of intravenous  contrast. Multiplanar CT image reconstructions including MIPs were obtained to evaluate the vascular anatomy. Carotid stenosis measurements (when applicable) are obtained utilizing NASCET criteria, using the distal internal carotid diameter as the denominator.  CONTRAST:  84mL OMNIPAQUE IOHEXOL 350 MG/ML SOLN  COMPARISON:  Head CT from the same day. Brain MRI and MRA 12/09/2008  FINDINGS: CTA HEAD FINDINGS  Posterior circulation: Most  notable for abrupt vessel cut off of the distal left P2. Codominant vertebral arteries. No aneurysm or significant stenosis.  Right anterior circulation: Extensive carotid siphon atherosclerosis. No evidence of aneurysm or significant stenosis. No major vessel cut off.  Left anterior circulation: Chronic occlusion of the left ICA to the level of the supraclinoid carotid, were there is reconstitution via the posterior communicating artery. Left A1 is hypoplastic. The left A2 is chronically attenuated. No high-grade stenosis or major vessel occlusion seen more distally.  Remote infarcts in the left frontal lobe and right parasagittal lobe. It is difficult to clearly see infarct in the parasagittal left occipital lobe, although there is likely acute ischemia given the history and angiographic findings. There is also subtle gray-white differentiation loss in this region. No abnormal intracranial enhancement. Patent dural venous sinuses.  Review of the MIP images confirms the above findings.  CTA NECK FINDINGS  Intrathoracic imaging notable for mediastinal and bilateral hilar lymphadenopathy. From chest CT 10/12/2002 report, this is a chronic finding. There is an irregularly marginated 7 mm nodule in the right upper lobe. Apical emphysema.  Two vessel aortic arch. Bilateral vertebral arteries show mild luminal irregularity and mural calcifications, consistent with atherosclerosis. No significant stenosis.  There is extensive calcified and noncalcified plaque throughout the right carotid  system, including the common carotid artery. There is kinking of the right internal carotid artery approximate 2 cm beyond the bifurcation, with kink and atherosclerosis causing 50-60% stenosis. No evidence of ulcerated plaque or dissection.  There is extensive atherosclerosis throughout the left common carotid artery. Chronic occlusion of the left ICA just beyond the bifurcation. The ICA is occluded to the supraclinoid segment, were there is reconstitution as noted below. This finding is chronic based on 2010 MRA.  Critical results were called by telephone at the time of interpretation on 02/02/2013 at 10:59 PM to Dr. Alexis Goodell , who verbally acknowledged these results.  Review of the MIP images confirms the above findings.  IMPRESSION: 1. Distal left P2 occlusion. No definite occipital infarct by CT, MRI MRI could confirm the presence of infarction. 2. 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 3. 50-60% stenosis of the cervical right ICA secondary to atherosclerotic plaque and vessel kinking. 4. 7 mm right upper lobe pulmonary nodule. Recommend chest CT followup in 3 months. 5. Mediastinal and hilar lymphadenopathy, chronic based on reporting from 2004. Is there history of sarcoidosis?   Electronically Signed   By: Jorje Guild M.D.   On: 02/02/2013 23:18   Ct Head Wo Contrast  02/02/2013   CLINICAL DATA:  Vision changes  EXAM: CT HEAD WITHOUT CONTRAST  TECHNIQUE: Contiguous axial images were obtained from the base of the skull through the vertex without intravenous contrast.  COMPARISON:  Prior MRI from 12/09/2008 and prior CT from 12/08/2008  FINDINGS: Wedge-shaped encephalomalacia within the left frontal lobe and right occipital lobe is seen, consistent with remote infarct. Remote left cerebral watershed infarct also present. Additional scattered and confluent hypodensity within the periventricular and deep white matter are  most consistent with chronic microvascular ischemic change. No definite acute large vessel territory infarct. There is no acute intracranial hemorrhage.  No mass lesion or midline shift. No hydrocephalus. There is no extra-axial fluid collection.  The calvarium is intact. Orbits are normal. Paranasal sinuses and mastoid air cells are clear.  IMPRESSION: 1. No acute intracranial abnormality. 2. Remote infarcts involving the left frontal and parietal lobes  as well as the right occipital lobe. 3. Atrophy with chronic microvascular ischemic changes.   Electronically Signed   By: Jeannine Boga M.D.   On: 02/02/2013 19:26   Ct Angio Neck W/cm &/or Wo/cm  02/02/2013   CLINICAL DATA:  Vision changes.  Rule out straw  EXAM: CT ANGIOGRAPHY HEAD AND NECK  TECHNIQUE: Multidetector CT imaging of the head and neck was performed using the standard protocol during bolus administration of intravenous contrast. Multiplanar CT image reconstructions including MIPs were obtained to evaluate the vascular anatomy. Carotid stenosis measurements (when applicable) are obtained utilizing NASCET criteria, using the distal internal carotid diameter as the denominator.  CONTRAST:  50mL OMNIPAQUE IOHEXOL 350 MG/ML SOLN  COMPARISON:  Head CT from the same day. Brain MRI and MRA 12/09/2008  FINDINGS: CTA HEAD FINDINGS  Posterior circulation: Most notable for abrupt vessel cut off of the distal left P2. Codominant vertebral arteries. No aneurysm or significant stenosis.  Right anterior circulation: Extensive carotid siphon atherosclerosis. No evidence of aneurysm or significant stenosis. No major vessel cut off.  Left anterior circulation: Chronic occlusion of the left ICA to the level of the supraclinoid carotid, were there is reconstitution via the posterior communicating artery. Left A1 is hypoplastic. The left A2 is chronically attenuated. No high-grade stenosis or major vessel occlusion seen more distally.  Remote infarcts in the left  frontal lobe and right parasagittal lobe. It is difficult to clearly see infarct in the parasagittal left occipital lobe, although there is likely acute ischemia given the history and angiographic findings. There is also subtle gray-white differentiation loss in this region. No abnormal intracranial enhancement. Patent dural venous sinuses.  Review of the MIP images confirms the above findings.  CTA NECK FINDINGS  Intrathoracic imaging notable for mediastinal and bilateral hilar lymphadenopathy. From chest CT 10/12/2002 report, this is a chronic finding. There is an irregularly marginated 7 mm nodule in the right upper lobe. Apical emphysema.  Two vessel aortic arch. Bilateral vertebral arteries show mild luminal irregularity and mural calcifications, consistent with atherosclerosis. No significant stenosis.  There is extensive calcified and noncalcified plaque throughout the right carotid system, including the common carotid artery. There is kinking of the right internal carotid artery approximate 2 cm beyond the bifurcation, with kink and atherosclerosis causing 50-60% stenosis. No evidence of ulcerated plaque or dissection.  There is extensive atherosclerosis throughout the left common carotid artery. Chronic occlusion of the left ICA just beyond the bifurcation. The ICA is occluded to the supraclinoid segment, were there is reconstitution as noted below. This finding is chronic based on 2010 MRA.  Critical results were called by telephone at the time of interpretation on 02/02/2013 at 10:59 PM to Dr. Alexis Goodell , who verbally acknowledged these results.  Review of the MIP images confirms the above findings.  IMPRESSION: 1. Distal left P2 occlusion. No definite occipital infarct by CT, MRI MRI could confirm the presence of infarction. 2. 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 3. 50-60% stenosis of the cervical right ICA secondary  to atherosclerotic plaque and vessel kinking. 4. 7 mm right upper lobe pulmonary nodule. Recommend chest CT followup in 3 months. 5. Mediastinal and hilar lymphadenopathy, chronic based on reporting from 2004. Is there history of sarcoidosis?   Electronically Signed   By: Jorje Guild M.D.   On: 02/02/2013 23:18   Mr Brain Wo Contrast  02/03/2013   CLINICAL DATA:  Vision changes.  Rule out stroke.  EXAM: MRI HEAD WITHOUT CONTRAST  TECHNIQUE: Multiplanar, multiecho pulse sequences of the brain and surrounding structures were obtained without intravenous contrast.  COMPARISON:  Head CT 02/02/2013 and brain MRI 12/09/2008  FINDINGS: There is a 3.9 cm focus of acute infarct involving the medial left occipital lobe with extension into the posterior medial left parietal lobe. There are also 2 subcentimeter foci of acute infarct involving the left thalamus. Left frontoparietal encephalomalacia and right occipital encephalomalacia are consistent with remote infarcts. Incidental note is made of a cavum septum pellucidum et vergae. There is ex vacuo dilatation of the occipital horn of the right lateral ventricle. There is no midline shift, mass, intracranial hemorrhage, or extra-axial fluid collection. Absent intracranial left ICA flow void is consistent with known occlusion. Orbits are unremarkable. Paranasal sinuses and mastoid air cells are clear.  IMPRESSION: 1. Acute left PCA infarcts involving the left parieto-occipital region and left thalamus. 2. Remote left MCA and right PCA infarcts.   Electronically Signed   By: Logan Bores   On: 02/03/2013 19:54    Oren Binet, MD  [facility] Pager:[phone]  If 7PM-7AM, please contact night-coverage [email or web address] Password [email or web address] 02/05/2013, 2:03 PM   LOS: 3 days

## 2013-02-05 NOTE — Progress Notes (Signed)
Clinical Social Work Department CLINICAL SOCIAL WORK PLACEMENT NOTE 02/05/2013  Patient:  Casey [name], Casey [name]  Account Number:  [id number] Admit date:  02/02/2013  Clinical Social Worker:  Adair Laundry  Date/time:  02/05/2013 04:30 PM  Clinical Social Work is seeking post-discharge placement for this patient at the following level of care:   SKILLED NURSING   (*CSW will update this form in Epic as items are completed)   02/05/2013  Patient/family provided with [place] Department of Clinical Social Work's list of facilities offering this level of care within the geographic area requested by the patient (or if unable, by the patient's family).  02/05/2013  Patient/family informed of their freedom to choose among providers that offer the needed level of care, that participate in Medicare, Medicaid or managed care program needed by the patient, have an available bed and are willing to accept the patient.  02/05/2013  Patient/family informed of [facility]' ownership interest in [facility], as well as of the fact that they are under no obligation to receive care at this facility.  PASARR submitted to [employer] on  PASARR number received from [place] on   FL2 transmitted to all facilities in geographic area requested by pt/family on  02/05/2013 FL2 transmitted to all facilities within larger geographic area on   Patient informed that his/her managed care company has contracts with or will negotiate with  certain facilities, including the following:     Patient/family informed of bed offers received:   Patient chooses bed at  Physician recommends and patient chooses bed at    Patient to be transferred to  on   Patient to be transferred to facility by   The following physician request were entered in Epic:   Additional CommentsBerton Mount, [place]

## 2013-02-05 NOTE — Progress Notes (Signed)
Occupational Therapy Treatment Patient Details Name: Casey [name] MRN: [id number] DOB: 1940/04/14 Today's Date: 02/05/2013 Time: 5852-7782 OT Time Calculation (min): 17 min  OT Assessment / Plan / Recommendation  History of present illness 73 y.o. female admitted to [facility] on 02/02/13 with hx of afib on Coumadin, though there were thought about switching her to Xarelto due to difficulty regulating her INR, hyperlipidemia, prior CVA, hx of CLL, PVD, gout, CHF, presents to the ER with visual loss.  She said she had a bad eye before, and thought it was the left eye, but she is quite a poor historian.  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  She has no complaint of focal weakness, palpitation, or shortness of breath.  Her work up in the ER included a head CT, which showed remote infarcts of the left frontal and parietal lobes, along with the right occipital lobe.  Her EKG showed afib with rate controlled, and no acute ST_T changes.  She has an unremarkable CBC, electrolytes, and a Cr of 1.4.  Neurology was consulted and hospitalist was asked to admit her for acute CVA.   OT comments  Pt seen with son present to assist back to bed. Pt required Mod A with transfer and was incontinent duringt transfer without awareness.Pt at high risk of falls. Pt will benefit form rehab at SNF to increase strength and endurance and independence with ADL and mobility to facilitate return home with family. Pt will need 24/7 assistance. Discussed D/C concerns with SW.  Follow Up Recommendations  SNF;Supervision/Assistance - 24 hour    Barriers to Discharge       Equipment Recommendations  None recommended by OT    Recommendations for Other Services    Frequency Min 2X/week   Progress towards OT Goals Progress towards OT goals: Progressing toward goals  Plan Discharge plan remains appropriate    Precautions / Restrictions Precautions Precautions: Fall Precaution Comments: cortically blind   Pertinent Vitals/Pain no apparent  distress     ADL  ADL Comments: Pt seen for second session with son present. sopn asking for his mother to go back to bed. Mod A with transfers. Unsafe. High risk of falls. Pt incontinent.    OT Diagnosis:    OT Problem List:   OT Treatment Interventions:     OT Goals(current goals can now be found in the care plan section) Acute Rehab OT Goals Patient Stated Goal: not stated OT Goal Formulation: Patient unable to participate in goal setting Time For Goal Achievement: 02/10/13 Potential to Achieve Goals: Good ADL Goals Pt Will Perform Upper Body Dressing: with supervision;sitting Pt Will Perform Lower Body Dressing: with supervision;sit to/from stand Pt Will Transfer to Toilet: with supervision;ambulating;bedside commode Pt Will Perform Toileting - Clothing Manipulation and hygiene: with supervision;sit to/from stand  Visit Information  Last OT Received On: 02/05/13 Assistance Needed: +1 History of Present Illness: 73 y.o. female admitted to [facility] on 02/02/13 with hx of afib on Coumadin, though there were thought about switching her to Xarelto due to difficulty regulating her INR, hyperlipidemia, prior CVA, hx of CLL, PVD, gout, CHF, presents to the ER with visual loss.  She said she had a bad eye before, and thought it was the left eye, but she is quite a poor historian.  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  She has no complaint of focal weakness, palpitation, or shortness of breath.  Her work up in the ER included a head CT, which showed remote infarcts of the left frontal and  parietal lobes, along with the right occipital lobe.  Her EKG showed afib with rate controlled, and no acute ST_T changes.  She has an unremarkable CBC, electrolytes, and a Cr of 1.4.  Neurology was consulted and hospitalist was asked to admit her for acute CVA.    Subjective Data      Prior Functioning       Cognition  Cognition Arousal/Alertness: Lethargic Behavior During Therapy: Flat affect Overall Cognitive  Status: Impaired/Different from baseline Area of Impairment: Orientation;Memory;Safety/judgement;Awareness;Problem solving Orientation Level: Disoriented to;Place;Time;Situation Memory: Decreased recall of precautions;Decreased short-term memory Following Commands: Follows one step commands consistently;Follows multi-step commands with increased time Safety/Judgement: Decreased awareness of safety;Decreased awareness of deficits Awareness: Intellectual Problem Solving: Slow processing;Decreased initiation;Difficulty sequencing;Requires verbal cues;Requires tactile cues    Mobility  Bed Mobility Overal bed mobility: Needs Assistance Bed Mobility: Supine to Sit;Sit to Supine Supine to sit: Mod assist Sit to supine: Mod assist General bed mobility comments: Pt able to move BLEs out of bed with increased time, requires hand over hand assist due to visual deficits for LUE to grap bed rail to self assist trunk.  She did require some assist to fully elevate trunk into sitting.  Transfers Overall transfer level: Needs assistance Equipment used: 1 person hand held assist Transfers: Sit to/from Stand Sit to Stand: Mod assist General transfer comment: Pt performed sit <> stand at min/mod assist level with more mod assist to stand from bed (lower surface) for increased forward weight shift.      Exercises      Balance  mod A  End of Session OT - End of Session Equipment Utilized During Treatment: Gait belt Activity Tolerance: Patient tolerated treatment well Patient left: with call bell/phone within reach;in bed;with bed alarm set;with family/visitor present Nurse Communication: Mobility status  GO     Casey [name],HILLARY 02/05/2013, 6:33 PM [facility], OTR/L  [phone] 02/05/2013

## 2013-02-05 NOTE — Progress Notes (Signed)
Physical Therapy Treatment Patient Details Name: Casey [name] MRN: [id number] DOB: 12/30/1940 Today's Date: 02/05/2013 Time: 8676-1950 PT Time Calculation (min): 18 min  PT Assessment / Plan / Recommendation  History of Present Illness 73 y.o. female admitted to [facility] on 02/02/13 with hx of afib on Coumadin, though there were thought about switching her to Xarelto due to difficulty regulating her INR, hyperlipidemia, prior CVA, hx of CLL, PVD, gout, CHF, presents to the ER with visual loss.  She said she had a bad eye before, and thought it was the left eye, but she is quite a poor historian.  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  She has no complaint of focal weakness, palpitation, or shortness of breath.  Her work up in the ER included a head CT, which showed remote infarcts of the left frontal and parietal lobes, along with the right occipital lobe.  Her EKG showed afib with rate controlled, and no acute ST_T changes.  She has an unremarkable CBC, electrolytes, and a Cr of 1.4.  Neurology was consulted and hospitalist was asked to admit her for acute CVA.   PT Comments   Pt very fatigued this afternoon, having just worked with OT, however she was agreeable to attempt ambulation.  She was able to perform ambulation at mod assist with one person HHA with cues for navigation due to visual deficits, as well as assist to increase weight shift to the R.    Follow Up Recommendations  SNF     Does the patient have the potential to tolerate intense rehabilitation     Barriers to Discharge        Equipment Recommendations   (unsure of what type of AD will assist due to visual deficits)    Recommendations for Other Services    Frequency Min 3X/week   Progress towards PT Goals Progress towards PT goals: Progressing toward goals  Plan Current plan remains appropriate    Precautions / Restrictions Precautions Precautions: Fall Precaution Comments: cortically blind   Pertinent Vitals/Pain Pt with mild c/o  headache, however better with rest and once back in bed.     Mobility  Bed Mobility Overal bed mobility: Needs Assistance Bed Mobility: Supine to Sit;Sit to Supine Supine to sit: Mod assist Sit to supine: Mod assist General bed mobility comments: Pt able to move BLEs out of bed with increased time, requires hand over hand assist due to visual deficits for LUE to grap bed rail to self assist trunk.  She did require some assist to fully elevate trunk into sitting.  Transfers Overall transfer level: Needs assistance Equipment used: 1 person hand held assist Transfers: Sit to/from Stand Sit to Stand: Mod assist General transfer comment: Pt performed sit <> stand at min/mod assist level with more mod assist to stand from bed (lower surface) for increased forward weight shift.   Ambulation/Gait Ambulation/Gait assistance: Mod assist Ambulation Distance (Feet): 25 Feet (to door and back) Assistive device: 1 person hand held assist Gait Pattern/deviations: Step-to pattern;Decreased stance time - right;Decreased step length - left;Decreased weight shift to right;Shuffle;Trunk flexed;Narrow base of support General Gait Details: Provided HHA on pts L side for gait training to/from doorway.  Provided manual facilitation for increased weight shift R for increased WB through RLE.  Also provided mod cues for increased step length on RLE.  Provided intermittent tactile and verbal cues for upright posture throughout.     Exercises     PT Diagnosis:    PT Problem List:  PT Treatment Interventions:     PT Goals (current goals can now be found in the care plan section) Acute Rehab PT Goals Patient Stated Goal: not stated PT Goal Formulation: With patient Time For Goal Achievement: 02/17/13 Potential to Achieve Goals: Good  Visit Information  Last PT Received On: 02/05/13 Assistance Needed: +1 History of Present Illness: 73 y.o. female admitted to [facility] on 02/02/13 with hx of afib on Coumadin, though  there were thought about switching her to Xarelto due to difficulty regulating her INR, hyperlipidemia, prior CVA, hx of CLL, PVD, gout, CHF, presents to the ER with visual loss.  She said she had a bad eye before, and thought it was the left eye, but she is quite a poor historian.  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  She has no complaint of focal weakness, palpitation, or shortness of breath.  Her work up in the ER included a head CT, which showed remote infarcts of the left frontal and parietal lobes, along with the right occipital lobe.  Her EKG showed afib with rate controlled, and no acute ST_T changes.  She has an unremarkable CBC, electrolytes, and a Cr of 1.4.  Neurology was consulted and hospitalist was asked to admit her for acute CVA.    Subjective Data  Patient Stated Goal: not stated   Cognition  Cognition Arousal/Alertness: Lethargic Behavior During Therapy: Flat affect Overall Cognitive Status: Impaired/Different from baseline Area of Impairment: Orientation;Memory;Safety/judgement;Awareness;Problem solving Orientation Level: Disoriented to;Place;Time;Situation Memory: Decreased recall of precautions;Decreased short-term memory Following Commands: Follows one step commands consistently;Follows multi-step commands with increased time Safety/Judgement: Decreased awareness of safety;Decreased awareness of deficits Awareness: Intellectual Problem Solving: Slow processing;Decreased initiation;Difficulty sequencing;Requires verbal cues;Requires tactile cues    Balance     End of Session PT - End of Session Equipment Utilized During Treatment: Gait belt Activity Tolerance: Patient limited by fatigue Patient left: in bed;with call bell/phone within reach;with bed alarm set   GP     Denice Bors 02/05/2013, 3:54 PM

## 2013-02-05 NOTE — Progress Notes (Signed)
ANTICOAGULATION CONSULT NOTE - Follow Up Consult  Pharmacy Consult for Coumadin Indication: atrial fibrillation, stroke  Allergies  Allergen Reactions  . Penicillins Other (See Comments)    Patient states that she was told previously by a doctor to not take this medication but is unsure why.     Patient Measurements: Height: 5\' 5"  (165.1 cm) Weight: 156 lb (70.761 kg) IBW/kg (Calculated) : 57  Vital Signs: Temp: 97.6 F (36.4 C) (01/08 1154) Temp src: Oral (01/08 1154) BP: 121/85 mmHg (01/08 1154) Pulse Rate: 78 (01/08 1154)  Labs:  Recent Labs  02/02/13 1845 02/02/13 1926 02/03/13 0500 02/04/13 0535 02/05/13 0520  HGB 14.1 15.6*  --  14.8  --   HCT 42.1 46.0  --  43.4  --   PLT 184  --   --  198  --   APTT 28  --   --   --   --   LABPROT 12.7  --  13.3 13.8 15.5*  INR 0.97  --  1.03 1.08 1.26  CREATININE 1.33* 1.40*  --  1.00  --   TROPONINI <0.30  --   --   --   --     Estimated Creatinine Clearance: 50.2 ml/min (by C-G formula based on Cr of 1).  Assessment: 73 y/o female on chronic Coumadin for Afib and hx stroke who presents with vision loss likely due to a new stroke. INR was at baseline on admission likely due to noncompliance. INR is subtherapeutic today at 1.26. No bleeding noted, CBC wnl.  Home dose is 5 mg daily except 7.5 mg MF  Goal of Therapy:  INR 2-3 Monitor platelets by anticoagulation protocol: Yes   Plan:  -Coumadin 5 mg PO x1 tonight -INR daily -Monitor for s/sx bleeding  [facility], Pharm.D., BCPS Clinical Pharmacist Pager: [phone] 02/05/2013 1:25 PM

## 2013-02-05 NOTE — Progress Notes (Signed)
Occupational Therapy Treatment Patient Details Name: Casey [name] MRN: [id number] DOB: 1940/08/03 Today's Date: 02/05/2013 Time: 2952-8413 OT Time Calculation (min): 38 min  OT Assessment / Plan / Recommendation  History of present illness 73 y.o. female admitted to [facility] on 02/02/13 with hx of afib on Coumadin, though there were thought about switching her to Xarelto due to difficulty regulating her INR, hyperlipidemia, prior CVA, hx of CLL, PVD, gout, CHF, presents to the ER with visual loss.  She said she had a bad eye before, and thought it was the left eye, but she is quite a poor historian.  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  She has no complaint of focal weakness, palpitation, or shortness of breath.  Her work up in the ER included a head CT, which showed remote infarcts of the left frontal and parietal lobes, along with the right occipital lobe.  Her EKG showed afib with rate controlled, and no acute ST_T changes.  She has an unremarkable CBC, electrolytes, and a Cr of 1.4.  Neurology was consulted and hospitalist was asked to admit her for acute CVA.   OT comments  Pt with significant decline in mobility as compared to last treatment session. Required Max A with mobility, Max to total A with ADL, incontinent of urine and not aware of time, situation or place. New cortical blindness per MD. Discussed with nsg. Pt will need to D/C to SNF for rehab. Pt is not safe to D/C home at the level observed this session. Will continue to follow and assess needs to facilitate D/C.  Follow Up Recommendations  SNF;Supervision/Assistance - 24 hour    Barriers to Discharge       Equipment Recommendations  None recommended by OT    Recommendations for Other Services    Frequency Min 2X/week   Progress towards OT Goals Progress towards OT goals: Progressing toward goals  Plan Discharge plan remains appropriate    Precautions / Restrictions Precautions Precautions: Fall Precaution Comments: cortically blind    Pertinent Vitals/Pain no apparent distress     ADL  Eating/Feeding: Set up Where Assessed - Eating/Feeding: Chair Grooming: Moderate assistance Where Assessed - Grooming: Supported sitting Upper Body Bathing: Moderate assistance Where Assessed - Upper Body Bathing: Supported sitting Lower Body Bathing: Maximal assistance Where Assessed - Lower Body Bathing: Supported sit to stand Upper Body Dressing: Moderate assistance Where Assessed - Upper Body Dressing: Supported sitting Lower Body Dressing: Maximal assistance Where Assessed - Lower Body Dressing: Supported sit to Lobbyist: Maximal assistance Toilet Transfer Method: Stand pivot Toileting - Clothing Manipulation and Hygiene: +1 Total assistance (pt incontinent) Equipment Used: Gait belt Transfers/Ambulation Related to ADLs: Max A    OT Diagnosis:    OT Problem List:   OT Treatment Interventions:     OT Goals(current goals can now be found in the care plan section) Acute Rehab OT Goals Patient Stated Goal: not stated OT Goal Formulation: Patient unable to participate in goal setting Time For Goal Achievement: 02/10/13 Potential to Achieve Goals: Good ADL Goals Pt Will Perform Upper Body Dressing: with supervision;sitting Pt Will Perform Lower Body Dressing: with supervision;sit to/from stand Pt Will Transfer to Toilet: with supervision;ambulating;bedside commode Pt Will Perform Toileting - Clothing Manipulation and hygiene: with supervision;sit to/from stand  Visit Information  Last OT Received On: 02/05/13 History of Present Illness: 73 y.o. female admitted to [facility] on 02/02/13 with hx of afib on Coumadin, though there were thought about switching her to Xarelto due to difficulty  regulating her INR, hyperlipidemia, prior CVA, hx of CLL, PVD, gout, CHF, presents to the ER with visual loss.  She said she had a bad eye before, and thought it was the left eye, but she is quite a poor historian.  Now she is totally  blind.  She has no complaint of focal weakness, palpitation, or shortness of breath.  Her work up in the ER included a head CT, which showed remote infarcts of the left frontal and parietal lobes, along with the right occipital lobe.  Her EKG showed afib with rate controlled, and no acute ST_T changes.  She has an unremarkable CBC, electrolytes, and a Cr of 1.4.  Neurology was consulted and hospitalist was asked to admit her for acute CVA.    Subjective Data      Prior Functioning       Cognition  Cognition Arousal/Alertness: Awake/alert Behavior During Therapy: Flat affect Overall Cognitive Status: Impaired/Different from baseline Area of Impairment: Orientation;Memory;Following commands;Safety/judgement;Awareness;Problem solving Orientation Level: Disoriented to;Place;Time;Situation Memory: Decreased recall of precautions;Decreased short-term memory Following Commands: Follows one step commands with increased time Safety/Judgement: Decreased awareness of safety;Decreased awareness of deficits Awareness: Intellectual Problem Solving: Slow processing;Decreased initiation;Difficulty sequencing;Requires verbal cues;Requires tactile cues    Mobility    Max A with functional transfers and with bed mobility   Exercises      Balance  Min A static sitting. Max A standing. High risk for falls  End of Session OT - End of Session Equipment Utilized During Treatment: Gait belt Activity Tolerance: Patient tolerated treatment well Patient left: in chair;with call bell/phone within reach;with chair alarm set Nurse Communication: Mobility status;Other (comment) (need for soft touch call bell)  GO     Eshawn Coor,HILLARY 02/05/2013, 2:39 PM [facility], OTR/L  [phone] 02/05/2013

## 2013-02-05 NOTE — Progress Notes (Signed)
CSW (Clinical Education officer, museum) spoke with multiple medical team members and was informed that pt will NOT be safe to dc home. CSW discussed this with pt son. Pt son understanding but informing CSW at this time there is no option if Medicare does not cover SNF. CSW explained that if pt is able to remain appropriate for in patient status through out Friday night pt will be able to have Medicare coverage for SNF. Pt son agreeable to CSW sending out referral to all of [place] in hope that pt is able to stay until at least Saturday.  [place], [place]

## 2013-02-05 NOTE — Progress Notes (Signed)
Occupational Therapy Evaluation Patient Details Name: Casey [name] MRN: [id number] DOB: 02-02-1940 Today's Date: 02/05/2013 Time:  -      02/03/13 1737  OT Visit Information  Last OT Received On 02/03/13  Assistance Needed +1  History of Present Illness 73 y.o. female admitted to [facility] on 02/02/13 with hx of afib on Coumadin, though there were thought about switching her to Xarelto due to difficulty regulating her INR, hyperlipidemia, prior CVA, hx of CLL, PVD, gout, CHF, presents to the ER with visual loss.  She said she had a bad eye before, and thought it was the left eye, but she is quite a poor historian.  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  She has no complaint of focal weakness, palpitation, or shortness of breath.  Her work up in the ER included a head CT, which showed remote infarcts of the left frontal and parietal lobes, along with the right occipital lobe.  Her EKG showed afib with rate controlled, and no acute ST_T changes.  She has an unremarkable CBC, electrolytes, and a Cr of 1.4.  Neurology was consulted and hospitalist was asked to admit her for acute CVA.  Precautions  Precautions Fall  Precaution Comments due to visual deficits and right sided weakness (upper extremity)  Home Living  Family/patient expects to be discharged to: Private residence  Living Arrangements Alone  Type of Accident to enter  Entrance Stairs-Number of Steps 1  Entrance Stairs-Rails None  Home Layout One level  Bathroom Shower/Tub Walk-in Engineer, materials Ashley - single point;Shower seat  Additional Comments unsure of accuracy of information; told OT she lived in apartment and PT mobile home/senior center.  Prior Function  Level of Independence (not sure)  Comments Per PT, pt reports she uses a cane outside of the home, does not drive, gets rides from friends, again poor historian an no family here to confirm any info given.    Communication   Communication No difficulties  Cognition  Arousal/Alertness Awake/alert  Behavior During Therapy WFL for tasks assessed/performed  Overall Cognitive Status Impaired/Different from baseline  Area of Impairment Orientation;Memory  Orientation Level Disoriented to;Time;Situation  Memory Decreased short-term memory (could not recall birthday independently)  Upper Extremity Assessment  Upper Extremity Assessment RUE deficits/detail  RUE Deficits / Details Pt with less than 90 degrees AROM shoulder flexion; minimal finger movement.  RUE Sensation decreased light touch  Lower Extremity Assessment  Lower Extremity Assessment Defer to PT evaluation  ADL  Grooming Wash/dry face;Teeth care;Minimal assistance  Where Assessed - Grooming Supported sitting  Upper Body Dressing Moderate assistance  Where Assessed - Upper Body Dressing Unsupported sitting  Lower Body Dressing Moderate assistance  Where Assessed - Lower Body Dressing Supported sit to stand  Toilet Transfer Minimal assistance  Toilet Transfer Method Sit to stand;Stand pivot  Barista - Clothing Manipulation and Hygiene Minimal assistance  Where Assessed - Best boy and Hygiene Sit to stand from 3-in-1 or toilet  Tub/Shower Transfer Method Not assessed  Equipment Used Gait belt  Transfers/Ambulation Related to ADLs Min A  ADL Comments Educated on dressing technique. Pt stood and performed hygiene after toileting and also wiped off legs with washcloth while sitting on bed due to pt being incontinent of urine in bed.  Vision - History  Patient Visual Report (difficulty seeing-pt having hard time describing)  OT - End of Session  Equipment Utilized During Treatment Gait  belt  Activity Tolerance Patient tolerated treatment well  Patient left in bed;with call bell/phone within reach;with bed alarm set  Nurse Communication Other (comment);Mobility status (discussed with  tech)  OT Assessment  OT Recommendation/Assessment Patient needs continued OT Services  OT Problem List Impaired vision/perception;Impaired balance (sitting and/or standing);Decreased activity tolerance;Decreased strength;Decreased cognition;Decreased safety awareness;Decreased knowledge of use of DME or AE;Decreased knowledge of precautions;Impaired UE functional use;Impaired sensation  OT Therapy Diagnosis  Generalized weakness;Disturbance of vision;Altered mental status  OT Plan  OT Frequency Min 2X/week  OT Treatment/Interventions Self-care/ADL training;Therapeutic exercise;Neuromuscular education;DME and/or AE instruction;Therapeutic activities;Cognitive remediation/compensation;Visual/perceptual remediation/compensation;Patient/family education;Balance training  OT Recommendation  Follow Up Recommendations SNF;Supervision/Assistance - 24 hour  OT Equipment Other (comment) (defer to next venue)  Individuals Consulted  Consulted and Agree with Results and Recommendations Patient unable/family or caregiver not available (due to cognition)  Acute Rehab OT Goals  Patient Stated Goal not stated  OT Goal Formulation (pt with decreased cognition)  Time For Goal Achievement 02/10/13  Potential to Achieve Goals Good  OT Time Calculation  OT Start Time 1655  OT Stop Time 1720  OT Time Calculation (min) 25 min  OT G-codes **NOT FOR INPATIENT CLASS**  Functional Assessment Tool Used clinical judgment  Functional Limitation Self care  Self Care Current Status (X5284) CJ  Self Care Goal Status (X3244) CI  OT General Charges  $OT Visit 1 Procedure  OT Evaluation  $Initial OT Evaluation Tier I 1 Procedure  OT Treatments  $Self Care/Home Management  8-22 mins  Written Expression  Dominant Hand Left  Bed Mobility  Bed Mobility Supine to Sit;Sit to Supine;Scooting to [facility]  Bed Mobility  Scooting to Edie 4: Min assist  Supine to Sit 4: Min assist  Sitting - Scoot to Edge of Bed 4: Min assist   Sit to Supine 4: Min guard  Bed Mobility  Details for Bed Mobility Assistance Cues for technique.  Details for Transfer Assistance Min A for sit <> stand and stand pivot transfer. Cues and assist for technique.  Transfers  Transfers Sit to Stand;Stand to Sit  Transfers  Sit to Stand 4: Min assist;From bed;From chair/3-in-1  Transfers  Stand to Sit 4: Min assist;To bed;To chair/3-in-1   Late entry for Zola Button, OTR/L for 02/03/2013 [place], OTR/L [phone] 02/05/2013

## 2013-02-05 NOTE — Progress Notes (Signed)
The Chaplain stopped by to visit the patient for an initial visit but the patient was resting. A follow up visit for the patient will be carried out by a Chaplain.  Chaplain Clista Bernhardt Burgess Sheriff

## 2013-02-06 LAB — GLUCOSE, CAPILLARY
GLUCOSE-CAPILLARY: 147 mg/dL — AB (ref 70–99)
GLUCOSE-CAPILLARY: 137 mg/dL — AB (ref 70–99)
GLUCOSE-CAPILLARY: 224 mg/dL — AB (ref 70–99)
Glucose-Capillary: 139 mg/dL — ABNORMAL HIGH (ref 70–99)

## 2013-02-06 LAB — PROTIME-INR
INR: 1.38 (ref 0.00–1.49)
Prothrombin Time: 16.6 seconds — ABNORMAL HIGH (ref 11.6–15.2)

## 2013-02-06 MED ORDER — WARFARIN SODIUM 7.5 MG PO TABS
7.5000 mg | ORAL_TABLET | Freq: Once | ORAL | Status: AC
  Administered 2013-02-06: 7.5 mg via ORAL
  Filled 2013-02-06: qty 1

## 2013-02-06 NOTE — Progress Notes (Signed)
ANTICOAGULATION CONSULT NOTE - Follow Up Consult  Pharmacy Consult for Coumadin Indication: atrial fibrillation, stroke  Allergies  Allergen Reactions  . Penicillins Other (See Comments)    Patient states that she was told previously by a doctor to not take this medication but is unsure why.     Patient Measurements: Height: 5\' 5"  (165.1 cm) Weight: 156 lb (70.761 kg) IBW/kg (Calculated) : 57  Vital Signs: Temp: 97.3 F (36.3 C) (01/09 0743) Temp src: Oral (01/09 0743) BP: 131/69 mmHg (01/09 0743) Pulse Rate: 70 (01/09 0743)  Labs:  Recent Labs  02/04/13 0535 02/05/13 0520 02/06/13 0519  HGB 14.8  --   --   HCT 43.4  --   --   PLT 198  --   --   LABPROT 13.8 15.5* 16.6*  INR 1.08 1.26 1.38  CREATININE 1.00  --   --     Estimated Creatinine Clearance: 50.2 ml/min (by C-G formula based on Cr of 1).  Assessment: 73 y/o female on chronic Coumadin for Afib and hx stroke who presents with vision loss likely due to a new stroke. INR was at baseline on admission likely due to noncompliance. INR is subtherapeutic today at 1.38. No bleeding noted, CBC wnl on 1/7.  Home dose is 5 mg daily except 7.5 mg MF  Goal of Therapy:  INR 2-3 Monitor platelets by anticoagulation protocol: Yes   Plan:  -Coumadin 7.5 mg PO x1 tonight -INR daily -Monitor for s/sx bleeding  [facility], Pharm.D., BCPS Clinical Pharmacist Pager: [phone] 02/06/2013 8:50 AM

## 2013-02-06 NOTE — Progress Notes (Signed)
Occupational Therapy Treatment/Re evaluation Patient Details Name: Casey [name] MRN: [id number] DOB: Aug 12, 1940 Today's Date: 02/06/2013 Time: 4742-5956 OT Time Calculation (min): 30 min  OT Assessment / Plan / Recommendation  History of present illness 73 y.o. female admitted to [facility] on 02/02/13 with hx of afib on Coumadin, though there were thought about switching her to Xarelto due to difficulty regulating her INR, hyperlipidemia, prior CVA, hx of CLL, PVD, gout, CHF, presents to the ER with visual loss.  She said she had a bad eye before, and thought it was the left eye, but she is quite a poor historian.  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  She has no complaint of focal weakness, palpitation, or shortness of breath.  Her work up in the ER included a head CT, which showed remote infarcts of the left frontal and parietal lobes, along with the right occipital lobe.  Her EKG showed afib with rate controlled, and no acute ST_T changes.  She has an unremarkable CBC, electrolytes, and a Cr of 1.4.  Neurology was consulted and hospitalist was asked to admit her for acute CVA.   OT comments  Pt appears to have had a significant decline in function since admit 3 days ago. Pt now requiring mod to total assist with all adls.  Pt will definitely need SNF d/c.  All initial goals d/c'd and new goals set in care plan section of chart.  Nursing aware.  Follow Up Recommendations  SNF;Supervision/Assistance - 24 hour    Barriers to Discharge       Equipment Recommendations  None recommended by OT    Recommendations for Other Services    Frequency Min 2X/week   Progress towards OT Goals Progress towards OT goals: Not progressing toward goals - comment;Goals drowngraded-see care plan  Plan Discharge plan remains appropriate    Precautions / Restrictions Precautions Precautions: Fall Precaution Comments: cortically blind Restrictions Weight Bearing Restrictions: No   Pertinent Vitals/Pain Vitals stable.  No  reports of pain.    ADL  Eating/Feeding: Performed;+1 Total assistance Where Assessed - Eating/Feeding: Chair Grooming: Performed;Moderate assistance;Teeth care;Wash/dry face;Wash/dry hands Where Assessed - Grooming: Supported sitting Upper Body Bathing: Simulated;Moderate assistance Where Assessed - Upper Body Bathing: Supported sitting Lower Body Bathing: Maximal assistance Where Assessed - Lower Body Bathing: Supported sit to stand Upper Body Dressing: Simulated;Maximal assistance Where Assessed - Upper Body Dressing: Supported sitting Lower Body Dressing: Simulated;+1 Total assistance Where Assessed - Lower Body Dressing: Supported sit to Lobbyist: Performed;Maximal Print production planner Method: Arts development officer: Therapist, occupational and Hygiene: +1 Total assistance Where Assessed - Best boy and Hygiene: Sit to stand from 3-in-1 or toilet Equipment Used: Gait belt Transfers/Ambulation Related to ADLs: Pt with significant decline since admission now requiring mod to total assist with all adls and mobility. ADL Comments: Pt now requiring max to total assist for most adls b/c of blindness, R side weakness and poor ability to stand.     OT Diagnosis:    OT Problem List:   OT Treatment Interventions:     OT Goals(current goals can now be found in the care plan section) Acute Rehab OT Goals Patient Stated Goal: not stated OT Goal Formulation: Patient unable to participate in goal setting Time For Goal Achievement: 02/20/13 Potential to Achieve Goals: Fair ADL Goals Pt Will Perform Eating: with mod assist;sitting (with min cues for location of food due to blindness.) Pt Will Perform Grooming: with mod assist;sitting Pt  Will Perform Upper Body Bathing: with min assist;sitting Pt Will Perform Upper Body Dressing: with mod assist;sitting Pt Will Transfer to Toilet: with min assist;stand pivot  transfer;bedside commode Pt/caregiver will Perform Home Exercise Program: Right Upper extremity;With written HEP provided;With minimal assist  Visit Information  Last OT Received On: 02/06/13 Assistance Needed: +1 History of Present Illness: 73 y.o. female admitted to [facility] on 02/02/13 with hx of afib on Coumadin, though there were thought about switching her to Xarelto due to difficulty regulating her INR, hyperlipidemia, prior CVA, hx of CLL, PVD, gout, CHF, presents to the ER with visual loss.  She said she had a bad eye before, and thought it was the left eye, but she is quite a poor historian.  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  She has no complaint of focal weakness, palpitation, or shortness of breath.  Her work up in the ER included a head CT, which showed remote infarcts of the left frontal and parietal lobes, along with the right occipital lobe.  Her EKG showed afib with rate controlled, and no acute ST_T changes.  She has an unremarkable CBC, electrolytes, and a Cr of 1.4.  Neurology was consulted and hospitalist was asked to admit her for acute CVA.    Subjective Data      Prior Functioning       Cognition  Cognition Arousal/Alertness: Lethargic Behavior During Therapy: Flat affect Overall Cognitive Status: Impaired/Different from baseline Area of Impairment: Orientation;Memory;Safety/judgement;Awareness;Problem solving Orientation Level: Disoriented to;Place;Time;Situation Memory: Decreased recall of precautions;Decreased short-term memory Following Commands: Follows one step commands consistently;Follows multi-step commands with increased time Safety/Judgement: Decreased awareness of safety;Decreased awareness of deficits Awareness: Intellectual Problem Solving: Slow processing;Decreased initiation;Difficulty sequencing;Requires verbal cues;Requires tactile cues General Comments: Pt very lethargic and overall at a very different level functionally than she was on admit.  Feel pt will not  be able to go home and will need SNF d/c.    Mobility       Exercises      Balance    End of Session OT - End of Session Equipment Utilized During Treatment: Gait belt Activity Tolerance: Patient limited by lethargy Patient left: with call bell/phone within reach;in bed;with bed alarm set;with family/visitor present Nurse Communication: Mobility status;Other (comment) (significant decline in status.)  GO     Glenford Peers 02/06/2013, 1:12 PM [phone]

## 2013-02-06 NOTE — Discharge Summary (Signed)
Physician Discharge Summary  Casey [name] FFM:[id number] DOB: [birth year] DOA: 02/02/2013  PCP: Oneal Grout, MD  Admit date: 02/02/2013 Discharge date: 02/06/2013  Time spent: 45 minutes  Recommendations for Outpatient Follow-up:  -Plan to DC to SNF over weekend.   Discharge Diagnoses:  Principal Problem:   Stroke Active Problems:   Chronic atrial fibrillation   Hypertension   Hypercholesterolemia   Type 2 diabetes mellitus with diabetic neuropathy   HTN (hypertension), benign   Hyperlipidemia   AKI (acute kidney injury)   Alzheimer's disease   Long term (current) use of anticoagulants   CVA (cerebral infarction)   Discharge Condition: Stable  Filed Weights   02/03/13 0000 02/04/13 0614 02/05/13 0335  Weight: 72.712 kg (160 lb 4.8 oz) 71.668 kg (158 lb) 70.761 kg (156 lb)    History of present illness:  Casey [name] is an 73 y.o. female with hx of afib on Coumadin, though there were thought about switching her to Xarelto due to difficulty regulating her INR, hyperlipidemia, prior CVA, hx of CLL, PVD, gout, CHF, presents to the ER with visual loss. She said she had a bad eye before, and thought it was the left eye, but she is quite a poor historian. 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 She has no complaint of focal weakness, palpitation, or shortness of breath. Her work up in the ER included a head CT, which showed remote infarcts of the left frontal and parietal lobes, along with the right occipital lobe. Her EKG showed afib with rate controlled, and no acute ST_T changes. She has an unremarkable CBC, electrolytes, and a Cr of 1.4. Neurology was consulted and hospitalist was asked to admit her for acute CVA.   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  - Has history of atrial fibrillation, on Coumadin. Coumadin dosing by pharmacy, INR continues to  be subtherapeutic. -Continues to be on aspirin as well. Continue statins  - Echocardiogram-shows EF around 35-40% with diffuse hypokinesis.  - Carotid Doppler shows occluded left internal carotid artery, borderline more than 40% stenosis in the right internal carotid artery. CTA neck shows chronic left ICA occlusion with reconstitution via the left posterior communicating artery.  - Neurology was consulted and have subsequently signed off.   Cortical blindness  - As above  -Secondary to acute CVA.  Chronic systolic heart failure  - Clinically compensated, continue with Lasix and enalapril. Also on metoprolol.   Incidental 7 mm right upper lobe pulmonary nodule  - Pt will need repeat CT chest in 3 months   Type 2 diabetes mellitus with diabetic neuropathy  - A1c- 7.0  - CBGs stable   Hyperlipidemia  - optimally controlled  - resume statin therapy   Hypertension  - Continue with amlodipine, metoprolol and enalapril.   Atrial fibrillation  - Coumadin per pharmacy-still subtherapeutic INR  - On metoprolol and digoxin for rate control   History of Alzheimer's disease  - Continue Aricept   History of seizures  - Continue Keppra   Procedures:  None   Consultations:  None  Discharge Instructions   Future Appointments Provider Department Dept Phone   10/06/2013 3:00 PM [facility]  [facility] Jason Nest [phone]   10/06/2013 3:40 PM Carma Lair Nickel, NP [facility] [phone]       Medication List  ASK your doctor about these medications       allopurinol 100 MG tablet  Commonly known as:  ZYLOPRIM  Take 100 mg by mouth daily.     AMBULATORY NON FORMULARY MEDICATION  - BD Lancets  - Use as Directed to check blood sugar  - Dx: 250.00     amLODipine 5 MG tablet  Commonly known as:  NORVASC  Take 5 mg by mouth daily.     digoxin 0.125 MG tablet  Commonly known as:  LANOXIN  Take 0.125 mg by mouth daily.      furosemide 20 MG tablet  Commonly known as:  LASIX  Take 20 mg by mouth daily.     glucose blood test strip  Commonly known as:  TRUETEST TEST  Test blood sugar twice daily to control blood sugar. 250.00     insulin glargine 100 UNIT/ML injection  Commonly known as:  LANTUS  Inject 12 Units into the skin at bedtime.     Insulin Syringe-Needle U-100 31G X 5/16" 0.3 ML Misc  Commonly known as:  BD INSULIN SYRINGE ULTRAFINE  USE TO GIVE NOVOLOG AND LANTUS INSULIN 4 TIMES A DAY.     levETIRAcetam 250 MG tablet  Commonly known as:  KEPPRA  Take 250 mg by mouth 2 (two) times daily.     metFORMIN 500 MG tablet  Commonly known as:  GLUCOPHAGE  Take 500 mg by mouth 2 (two) times daily with a meal.     metoprolol succinate 50 MG 24 hr tablet  Commonly known as:  TOPROL-XL  Take 50 mg by mouth 2 (two) times daily. Take with or immediately following a meal.     potassium chloride 10 MEQ tablet  Commonly known as:  K-DUR,KLOR-CON  Take 10 mEq by mouth daily.     warfarin 5 MG tablet  Commonly known as:  COUMADIN  Take 2.5-5 mg by mouth daily. Take 1/2 tablet (2.5 mg) on Tuesday and Saturday, take 1 tablet (5 mg) on Sunday, Monday, Wednesday, Thursday, Friday.       Allergies  Allergen Reactions  . Penicillins Other (See Comments)    Patient states that she was told previously by a doctor to not take this medication but is unsure why.        Follow-up Information   Follow up with Forbes Cellar, MD. Schedule an appointment as soon as possible for a visit in 2 months. (stroke clinic)    Specialties:  Neurology, Radiology   Contact information:   [address] [place] [address] [phone]        The results of significant diagnostics from this hospitalization (including imaging, microbiology, ancillary and laboratory) are listed below for reference.    Significant Diagnostic Studies: Ct Angio Head W/cm &/or Wo Cm  02/02/2013   CLINICAL DATA:   Vision changes.  Rule out straw  EXAM: CT ANGIOGRAPHY HEAD AND NECK  TECHNIQUE: Multidetector CT imaging of the head and neck was performed using the standard protocol during bolus administration of intravenous contrast. Multiplanar CT image reconstructions including MIPs were obtained to evaluate the vascular anatomy. Carotid stenosis measurements (when applicable) are obtained utilizing NASCET criteria, using the distal internal carotid diameter as the denominator.  CONTRAST:  28mL OMNIPAQUE IOHEXOL 350 MG/ML SOLN  COMPARISON:  Head CT from the same day. Brain MRI and MRA 12/09/2008  FINDINGS: CTA HEAD FINDINGS  Posterior circulation: Most notable for abrupt vessel cut off of the distal left P2. Codominant  vertebral arteries. No aneurysm or significant stenosis.  Right anterior circulation: Extensive carotid siphon atherosclerosis. No evidence of aneurysm or significant stenosis. No major vessel cut off.  Left anterior circulation: Chronic occlusion of the left ICA to the level of the supraclinoid carotid, were there is reconstitution via the posterior communicating artery. Left A1 is hypoplastic. The left A2 is chronically attenuated. No high-grade stenosis or major vessel occlusion seen more distally.  Remote infarcts in the left frontal lobe and right parasagittal lobe. It is difficult to clearly see infarct in the parasagittal left occipital lobe, although there is likely acute ischemia given the history and angiographic findings. There is also subtle gray-white differentiation loss in this region. No abnormal intracranial enhancement. Patent dural venous sinuses.  Review of the MIP images confirms the above findings.  CTA NECK FINDINGS  Intrathoracic imaging notable for mediastinal and bilateral hilar lymphadenopathy. From chest CT 10/12/2002 report, this is a chronic finding. There is an irregularly marginated 7 mm nodule in the right upper lobe. Apical emphysema.  Two vessel aortic arch. Bilateral vertebral  arteries show mild luminal irregularity and mural calcifications, consistent with atherosclerosis. No significant stenosis.  There is extensive calcified and noncalcified plaque throughout the right carotid system, including the common carotid artery. There is kinking of the right internal carotid artery approximate 2 cm beyond the bifurcation, with kink and atherosclerosis causing 50-60% stenosis. No evidence of ulcerated plaque or dissection.  There is extensive atherosclerosis throughout the left common carotid artery. Chronic occlusion of the left ICA just beyond the bifurcation. The ICA is occluded to the supraclinoid segment, were there is reconstitution as noted below. This finding is chronic based on 2010 MRA.  Critical results were called by telephone at the time of interpretation on 02/02/2013 at 10:59 PM to Dr. Thana Farr , who verbally acknowledged these results.  Review of the MIP images confirms the above findings.  IMPRESSION: 1. Distal left P2 occlusion. No definite occipital infarct by CT, MRI MRI could confirm the presence of infarction. 2. 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 3. 50-60% stenosis of the cervical right ICA secondary to atherosclerotic plaque and vessel kinking. 4. 7 mm right upper lobe pulmonary nodule. Recommend chest CT followup in 3 months. 5. Mediastinal and hilar lymphadenopathy, chronic based on reporting from 2004. Is there history of sarcoidosis?   Electronically Signed   By: Tiburcio Pea M.D.   On: 02/02/2013 23:18   Ct Head Wo Contrast  02/02/2013   CLINICAL DATA:  Vision changes  EXAM: CT HEAD WITHOUT CONTRAST  TECHNIQUE: Contiguous axial images were obtained from the base of the skull through the vertex without intravenous contrast.  COMPARISON:  Prior MRI from 12/09/2008 and prior CT from 12/08/2008  FINDINGS: Wedge-shaped encephalomalacia within the left frontal lobe and right  occipital lobe is seen, consistent with remote infarct. Remote left cerebral watershed infarct also present. Additional scattered and confluent hypodensity within the periventricular and deep white matter are most consistent with chronic microvascular ischemic change. No definite acute large vessel territory infarct. There is no acute intracranial hemorrhage.  No mass lesion or midline shift. No hydrocephalus. There is no extra-axial fluid collection.  The calvarium is intact. Orbits are normal. Paranasal sinuses and mastoid air cells are clear.  IMPRESSION: 1. No acute intracranial abnormality. 2. Remote infarcts involving the left frontal and parietal lobes as well as the right occipital lobe. 3. Atrophy with chronic microvascular  ischemic changes.   Electronically Signed   By: Jeannine Boga M.D.   On: 02/02/2013 19:26   Ct Angio Neck W/cm &/or Wo/cm  02/02/2013   CLINICAL DATA:  Vision changes.  Rule out straw  EXAM: CT ANGIOGRAPHY HEAD AND NECK  TECHNIQUE: Multidetector CT imaging of the head and neck was performed using the standard protocol during bolus administration of intravenous contrast. Multiplanar CT image reconstructions including MIPs were obtained to evaluate the vascular anatomy. Carotid stenosis measurements (when applicable) are obtained utilizing NASCET criteria, using the distal internal carotid diameter as the denominator.  CONTRAST:  1mL OMNIPAQUE IOHEXOL 350 MG/ML SOLN  COMPARISON:  Head CT from the same day. Brain MRI and MRA 12/09/2008  FINDINGS: CTA HEAD FINDINGS  Posterior circulation: Most notable for abrupt vessel cut off of the distal left P2. Codominant vertebral arteries. No aneurysm or significant stenosis.  Right anterior circulation: Extensive carotid siphon atherosclerosis. No evidence of aneurysm or significant stenosis. No major vessel cut off.  Left anterior circulation: Chronic occlusion of the left ICA to the level of the supraclinoid carotid, were there is  reconstitution via the posterior communicating artery. Left A1 is hypoplastic. The left A2 is chronically attenuated. No high-grade stenosis or major vessel occlusion seen more distally.  Remote infarcts in the left frontal lobe and right parasagittal lobe. It is difficult to clearly see infarct in the parasagittal left occipital lobe, although there is likely acute ischemia given the history and angiographic findings. There is also subtle gray-white differentiation loss in this region. No abnormal intracranial enhancement. Patent dural venous sinuses.  Review of the MIP images confirms the above findings.  CTA NECK FINDINGS  Intrathoracic imaging notable for mediastinal and bilateral hilar lymphadenopathy. From chest CT 10/12/2002 report, this is a chronic finding. There is an irregularly marginated 7 mm nodule in the right upper lobe. Apical emphysema.  Two vessel aortic arch. Bilateral vertebral arteries show mild luminal irregularity and mural calcifications, consistent with atherosclerosis. No significant stenosis.  There is extensive calcified and noncalcified plaque throughout the right carotid system, including the common carotid artery. There is kinking of the right internal carotid artery approximate 2 cm beyond the bifurcation, with kink and atherosclerosis causing 50-60% stenosis. No evidence of ulcerated plaque or dissection.  There is extensive atherosclerosis throughout the left common carotid artery. Chronic occlusion of the left ICA just beyond the bifurcation. The ICA is occluded to the supraclinoid segment, were there is reconstitution as noted below. This finding is chronic based on 2010 MRA.  Critical results were called by telephone at the time of interpretation on 02/02/2013 at 10:59 PM to Dr. Alexis Goodell , who verbally acknowledged these results.  Review of the MIP images confirms the above findings.  IMPRESSION: 1. Distal left P2 occlusion. No definite occipital infarct by CT, MRI MRI  could confirm the presence of infarction. 2. 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 3. 50-60% stenosis of the cervical right ICA secondary to atherosclerotic plaque and vessel kinking. 4. 7 mm right upper lobe pulmonary nodule. Recommend chest CT followup in 3 months. 5. Mediastinal and hilar lymphadenopathy, chronic based on reporting from 2004. Is there history of sarcoidosis?   Electronically Signed   By: Jorje Guild M.D.   On: 02/02/2013 23:18   Mr Brain Wo Contrast  02/03/2013   CLINICAL DATA:  Vision changes.  Rule out stroke.  EXAM: MRI HEAD WITHOUT CONTRAST  TECHNIQUE:  Multiplanar, multiecho pulse sequences of the brain and surrounding structures were obtained without intravenous contrast.  COMPARISON:  Head CT 02/02/2013 and brain MRI 12/09/2008  FINDINGS: There is a 3.9 cm focus of acute infarct involving the medial left occipital lobe with extension into the posterior medial left parietal lobe. There are also 2 subcentimeter foci of acute infarct involving the left thalamus. Left frontoparietal encephalomalacia and right occipital encephalomalacia are consistent with remote infarcts. Incidental note is made of a cavum septum pellucidum et vergae. There is ex vacuo dilatation of the occipital horn of the right lateral ventricle. There is no midline shift, mass, intracranial hemorrhage, or extra-axial fluid collection. Absent intracranial left ICA flow void is consistent with known occlusion. Orbits are unremarkable. Paranasal sinuses and mastoid air cells are clear.  IMPRESSION: 1. Acute left PCA infarcts involving the left parieto-occipital region and left thalamus. 2. Remote left MCA and right PCA infarcts.   Electronically Signed   By: Logan Bores   On: 02/03/2013 19:54    Microbiology: No results found for this or any previous visit (from the past 240 hour(s)).   Labs: Basic Metabolic Panel:  Recent  Labs Lab 02/02/13 1845 02/02/13 1926 02/04/13 0535  NA 139 140 138  K 5.3 5.2 4.1  CL 102 106 101  CO2 26  --  21  GLUCOSE 152* 152* 152*  BUN 26* 27* 17  CREATININE 1.33* 1.40* 1.00  CALCIUM 9.8  --  10.1   Liver Function Tests:  Recent Labs Lab 02/02/13 1845  AST 22  ALT 11  ALKPHOS 93  BILITOT 0.4  PROT 7.9  ALBUMIN 3.7   No results found for this basename: LIPASE, AMYLASE,  in the last 168 hours No results found for this basename: AMMONIA,  in the last 168 hours CBC:  Recent Labs Lab 02/02/13 1845 02/02/13 1926 02/04/13 0535  WBC 8.8  --  6.2  NEUTROABS 6.1  --   --   HGB 14.1 15.6* 14.8  HCT 42.1 46.0 43.4  MCV 87.2  --  84.8  PLT 184  --  198   Cardiac Enzymes:  Recent Labs Lab 02/02/13 1845  TROPONINI <0.30   BNP: BNP (last 3 results) No results found for this basename: PROBNP,  in the last 8760 hours CBG:  Recent Labs Lab 02/05/13 1140 02/05/13 1728 02/05/13 2116 02/06/13 0740 02/06/13 1125  GLUCAP 140* 163* 134* 139* 147*       Signed:  HERNANDEZ ACOSTA,ESTELA  [facility] Pager: [phone] 02/06/2013, 4:04 PM

## 2013-02-06 NOTE — Progress Notes (Signed)
CSW Armed forces technical officer) spoke with pt son and provided bed offers. Pt family would like to go to [place]. CSW spoke with MD and notified that is plan is for pt to dc over weekend facility will need dc summary by 4pm today.  [place], [place]

## 2013-02-07 LAB — PROTIME-INR
INR: 1.59 — ABNORMAL HIGH (ref 0.00–1.49)
Prothrombin Time: 18.5 seconds — ABNORMAL HIGH (ref 11.6–15.2)

## 2013-02-07 LAB — GLUCOSE, CAPILLARY
Glucose-Capillary: 162 mg/dL — ABNORMAL HIGH (ref 70–99)
Glucose-Capillary: 149 mg/dL — ABNORMAL HIGH (ref 70–99)

## 2013-02-07 MED ORDER — OXYBUTYNIN CHLORIDE 5 MG PO TABS: 5.0000 mg | ORAL_TABLET | Freq: Two times a day (BID) | ORAL | Status: DC

## 2013-02-07 MED ORDER — SIMVASTATIN 20 MG PO TABS: 20.0000 mg | ORAL_TABLET | Freq: Every day | ORAL | Status: DC

## 2013-02-07 MED ORDER — ASPIRIN 81 MG PO CHEW: 81.0000 mg | CHEWABLE_TABLET | Freq: Every day | ORAL | Status: DC

## 2013-02-07 MED ORDER — WARFARIN SODIUM 7.5 MG PO TABS
7.5000 mg | ORAL_TABLET | Freq: Once | ORAL | Status: DC
  Filled 2013-02-07: qty 1

## 2013-02-07 MED ORDER — ENALAPRIL MALEATE 2.5 MG PO TABS: 2.5000 mg | ORAL_TABLET | Freq: Every day | ORAL | Status: DC

## 2013-02-07 MED ORDER — DONEPEZIL HCL 10 MG PO TABS: 10.0000 mg | ORAL_TABLET | Freq: Every day | ORAL | Status: DC

## 2013-02-07 MED ORDER — TRAMADOL HCL 50 MG PO TABS: 50.0000 mg | ORAL_TABLET | Freq: Three times a day (TID) | ORAL | Status: DC | PRN

## 2013-02-07 NOTE — Progress Notes (Signed)
D/c orders received;IV removed with gauze on, pt remains in stable condition. Unable to review d/c info with pt due to dementia; left voicemail for pts son, Legrand [place], and informed him of pts d/c information that will be with pt at d/c. Pt d/c to [facility], report called, to be transported via ambulance

## 2013-02-07 NOTE — Plan of Care (Signed)
Problem: Progression Outcomes Goal: Educational plan initiated Outcome: Completed/Met Date Met:  02/07/13 Left voicemail for pts son,Michael, of pts stroke info going with pt to SNF

## 2013-02-07 NOTE — Progress Notes (Signed)
Pt discharged to [facility] via ambulance today.  [phone] (weekend CSW)

## 2013-02-07 NOTE — Progress Notes (Addendum)
ANTICOAGULATION CONSULT NOTE - Follow Up Consult  Pharmacy Consult for Coumadin Indication: atrial fibrillation, stroke  Allergies  Allergen Reactions  . Penicillins Other (See Comments)    Patient states that she was told previously by a doctor to not take this medication but is unsure why.    Patient Measurements: Height: 5\' 5"  (165.1 cm) Weight: 152 lb 4.8 oz (69.083 kg) IBW/kg (Calculated) : 57  Vital Signs: Temp: 98.1 F (36.7 C) (01/10 0400) Temp src: Oral (01/10 0400) BP: 148/76 mmHg (01/10 0400) Pulse Rate: 75 (01/10 0400)  Labs:  Recent Labs  02/05/13 0520 02/06/13 0519 02/07/13 0425  LABPROT 15.5* 16.6* 18.5*  INR 1.26 1.38 1.59*   Estimated Creatinine Clearance: 49.6 ml/min (by C-G formula based on Cr of 1).  Assessment: 73 y/o female on chronic Coumadin for Afib and hx stroke who presents with vision loss likely due to a new stroke. INR was at baseline on admission likely due to noncompliance. INR is trending up nicely today at 1.59, would expect her to be within goal range in the next 48 hours. No bleeding noted, per nursing assessment documentation.  CBC wnl on 1/7.    HOME dose: is 5 mg daily except 7.5 mg MF  Drug/Drug Interaction  The hypoprothrombinemic effect of warfarin may be increased by allopurinol.  Goal of Therapy:  INR 2-3 Monitor platelets by anticoagulation protocol: Yes   Plan:  -Coumadin 7.5 mg PO x1 tonight -INR daily -Monitor for s/sx bleeding and drug/drug interacting medications  Rober Minion, PharmD., MS Clinical Pharmacist Pager:  [phone] Thank you for allowing pharmacy to be part of this patients care team. 02/07/2013 7:23 AM

## 2013-02-07 NOTE — Discharge Instructions (Signed)
STROKE/TIA DISCHARGE INSTRUCTIONS SMOKING Cigarette smoking nearly doubles your risk of having a stroke & is the single most alterable risk factor  If you smoke or have smoked in the last 12 months, you are advised to quit smoking for your health.  Most of the excess cardiovascular risk related to smoking disappears within a year of stopping.  Ask you doctor about anti-smoking medications  [place] Quit Line: 1-800-QUIT NOW  Free Smoking Cessation Classes [phone]  CHOLESTEROL Know your levels; limit fat & cholesterol in your diet  Lipid Panel     Component Value Date/Time   CHOL 192 02/03/2013 0500   TRIG 156* 02/03/2013 0500   HDL 72 02/03/2013 0500   CHOLHDL 2.7 02/03/2013 0500   VLDL 31 02/03/2013 0500   LDLCALC 89 02/03/2013 0500      Many patients benefit from treatment even if their cholesterol is at goal.  Goal: Total Cholesterol (CHOL) less than 160  Goal:  Triglycerides (TRIG) less than 150  Goal:  HDL greater than 40  Goal:  LDL (LDLCALC) less than 100   BLOOD PRESSURE American Stroke Association blood pressure target is less that 120/80 mm/Hg  Your discharge blood pressure is:  BP: 115/74 mmHg  Monitor your blood pressure  Limit your salt and alcohol intake  Many individuals will require more than one medication for high blood pressure  DIABETES (A1c is a blood sugar average for last 3 months) Goal HGBA1c is under 7% (HBGA1c is blood sugar average for last 3 months)  Diabetes: Diagnosis of diabetes:  Your A1c:7.0 %    Lab Results  Component Value Date   HGBA1C 7.0* 02/03/2013     Your HGBA1c can be lowered with medications, healthy diet, and exercise.  Check your blood sugar as directed by your physician  Call your physician if you experience unexplained or low blood sugars.  PHYSICAL ACTIVITY/REHABILITATION Goal is 30 minutes at least 4 days per week  Activity: Increase activity slowly, and Walk with assistance, Therapies: SNF Return to work: n/a  Activity  decreases your risk of heart attack and stroke and makes your heart stronger.  It helps control your weight and blood pressure; helps you relax and can improve your mood.  Participate in a regular exercise program.  Talk with your doctor about the best form of exercise for you (dancing, walking, swimming, cycling).  DIET/WEIGHT Goal is to maintain a healthy weight  Your discharge diet is: Carb Control thin liquids Your height is:  Height: 5\' 5"  (165.1 cm) Your current weight is: Weight: 69.083 kg (152 lb 4.8 oz) Your Body Mass Index (BMI) is:  BMI (Calculated): 26.7  Following the type of diet specifically designed for you will help prevent another stroke.  Your goal weight range is:    Your goal Body Mass Index (BMI) is 19-24.  Healthy food habits can help reduce 3 risk factors for stroke:  High cholesterol, hypertension, and excess weight.  RESOURCES Stroke/Support Group:  Call [phone]   STROKE EDUCATION PROVIDED/REVIEWED AND GIVEN TO PATIENT Stroke warning signs and symptoms How to activate emergency medical system (call 911). Medications prescribed at discharge. Need for follow-up after discharge. Personal risk factors for stroke. Pneumonia vaccine given: No Flu vaccine given: No My questions have been answered, the writing is legible, and I understand these instructions.  I will adhere to these goals & educational materials that have been provided to me after my discharge from the hospital.

## 2013-02-07 NOTE — Discharge Summary (Signed)
Physician Discharge Summary  Casey [name] OEU:[id number] DOB: March 15, 1940 DOA: 02/02/2013  PCP: Blanchie Serve, MD  Admit date: 02/02/2013 Discharge date: 02/07/2013  Time spent: 45 minutes  Recommendations for Outpatient Follow-up:  -Plan to DC to SNF over weekend.   Discharge Diagnoses:  Principal Problem:   Stroke Active Problems:   Chronic atrial fibrillation   Hypertension   Hypercholesterolemia   Type 2 diabetes mellitus with diabetic neuropathy   HTN (hypertension), benign   Hyperlipidemia   AKI (acute kidney injury)   Alzheimer's disease   Long term (current) use of anticoagulants   CVA (cerebral infarction)   Discharge Condition: Stable  Filed Weights   02/04/13 0614 02/05/13 0335 02/07/13 0000  Weight: 71.668 kg (158 lb) 70.761 kg (156 lb) 69.083 kg (152 lb 4.8 oz)    History of present illness:  Casey [name] is an 73 y.o. female with hx of afib on Coumadin, though there were thought about switching her to Xarelto due to difficulty regulating her INR, hyperlipidemia, prior CVA, hx of CLL, PVD, gout, CHF, presents to the ER with visual loss. She said she had a bad eye before, and thought it was the left eye, but she is quite a poor historian. 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 She has no complaint of focal weakness, palpitation, or shortness of breath. Her work up in the ER included a head CT, which showed remote infarcts of the left frontal and parietal lobes, along with the right occipital lobe. Her EKG showed afib with rate controlled, and no acute ST_T changes. She has an unremarkable CBC, electrolytes, and a Cr of 1.4. Neurology was consulted and hospitalist was asked to admit her for acute CVA.   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  - Has history of atrial fibrillation, on Coumadin. Coumadin dosing by pharmacy, INR continues to  be subtherapeutic. -Continues to be on aspirin as well. Continue statins  - Echocardiogram-shows EF around 35-40% with diffuse hypokinesis.  - Carotid Doppler shows occluded left internal carotid artery, borderline more than 40% stenosis in the right internal carotid artery. CTA neck shows chronic left ICA occlusion with reconstitution via the left posterior communicating artery.  - Neurology was consulted and have subsequently signed off.   Cortical blindness  - As above  -Secondary to acute CVA.  Chronic systolic heart failure  - Clinically compensated, continue with Lasix and enalapril. Also on metoprolol.   Incidental 7 mm right upper lobe pulmonary nodule  - Pt will need repeat CT chest in 3 months   Type 2 diabetes mellitus with diabetic neuropathy  - A1c- 7.0  - CBGs stable   Hyperlipidemia  - optimally controlled  - resume statin therapy   Hypertension  - Continue with amlodipine, metoprolol and enalapril.   Atrial fibrillation  - Coumadin per pharmacy-still subtherapeutic INR  - On metoprolol and digoxin for rate control   History of Alzheimer's disease  - Continue Aricept   History of seizures  - Continue Keppra   Procedures:  None   Consultations:  None  Discharge Instructions      Discharge Orders   Future Appointments Provider Department Dept Phone   10/06/2013 3:00 PM [facility] [place] [phone]   10/06/2013 3:40 PM Sharmon Leyden Nickel, NP [facility] [phone]   Future  Orders Complete By Expires   Diet - low sodium heart healthy  As directed    Discontinue IV  As directed    Increase activity slowly  As directed        Medication List    STOP taking these medications       AMBULATORY NON FORMULARY MEDICATION     aspirin 81 MG tablet  Replaced by:  aspirin 81 MG chewable tablet      TAKE these medications       allopurinol 100 MG tablet  Commonly known as:  ZYLOPRIM   Take 100 mg by mouth daily.     amLODipine 5 MG tablet  Commonly known as:  NORVASC  Take 5 mg by mouth daily.     aspirin 81 MG chewable tablet  Chew 1 tablet (81 mg total) by mouth daily.     digoxin 0.125 MG tablet  Commonly known as:  LANOXIN  Take 0.125 mg by mouth daily.     donepezil 10 MG tablet  Commonly known as:  ARICEPT  Take 1 tablet (10 mg total) by mouth at bedtime.     enalapril 2.5 MG tablet  Commonly known as:  VASOTEC  Take 1 tablet (2.5 mg total) by mouth daily.     furosemide 20 MG tablet  Commonly known as:  LASIX  Take 20 mg by mouth daily.     glucose blood test strip  Commonly known as:  TRUETEST TEST  Test blood sugar twice daily to control blood sugar. 250.00     insulin glargine 100 UNIT/ML injection  Commonly known as:  LANTUS  Inject 12 Units into the skin at bedtime.     Insulin Syringe-Needle U-100 31G X 5/16" 0.3 ML Misc  Commonly known as:  BD INSULIN SYRINGE ULTRAFINE  USE TO GIVE NOVOLOG AND LANTUS INSULIN 4 TIMES A DAY.     levETIRAcetam 250 MG tablet  Commonly known as:  KEPPRA  Take 250 mg by mouth 2 (two) times daily.     metFORMIN 500 MG tablet  Commonly known as:  GLUCOPHAGE  Take 500 mg by mouth 2 (two) times daily with a meal.     metoprolol succinate 50 MG 24 hr tablet  Commonly known as:  TOPROL-XL  Take 50 mg by mouth 2 (two) times daily. Take with or immediately following a meal.     oxybutynin 5 MG tablet  Commonly known as:  DITROPAN  Take 1 tablet (5 mg total) by mouth 2 (two) times daily.     potassium chloride 10 MEQ tablet  Commonly known as:  K-DUR,KLOR-CON  Take 10 mEq by mouth daily.     simvastatin 20 MG tablet  Commonly known as:  ZOCOR  Take 1 tablet (20 mg total) by mouth daily.     traMADol 50 MG tablet  Commonly known as:  ULTRAM  Take 1 tablet (50 mg total) by mouth 3 (three) times daily as needed for moderate pain.     warfarin 5 MG tablet  Commonly known as:  COUMADIN  Take 2.5-5 mg  by mouth daily. Take 1/2 tablet (2.5 mg) on Tuesday and Saturday, take 1 tablet (5 mg) on Sunday, Monday, Wednesday, Thursday, Friday.       Allergies  Allergen Reactions  . Penicillins Other (See Comments)    Patient states that she was told previously by a doctor to not take this medication but is unsure why.    Follow-up Information   Follow  up with Forbes Cellar, MD. Schedule an appointment as soon as possible for a visit in 2 months. (stroke clinic)    Specialties:  Neurology, Radiology   Contact information:   [address] [place] [address] [phone]        The results of significant diagnostics from this hospitalization (including imaging, microbiology, ancillary and laboratory) are listed below for reference.    Significant Diagnostic Studies: Ct Angio Head W/cm &/or Wo Cm  02/02/2013   CLINICAL DATA:  Vision changes.  Rule out straw  EXAM: CT ANGIOGRAPHY HEAD AND NECK  TECHNIQUE: Multidetector CT imaging of the head and neck was performed using the standard protocol during bolus administration of intravenous contrast. Multiplanar CT image reconstructions including MIPs were obtained to evaluate the vascular anatomy. Carotid stenosis measurements (when applicable) are obtained utilizing NASCET criteria, using the distal internal carotid diameter as the denominator.  CONTRAST:  74mL OMNIPAQUE IOHEXOL 350 MG/ML SOLN  COMPARISON:  Head CT from the same day. Brain MRI and MRA 12/09/2008  FINDINGS: CTA HEAD FINDINGS  Posterior circulation: Most notable for abrupt vessel cut off of the distal left P2. Codominant vertebral arteries. No aneurysm or significant stenosis.  Right anterior circulation: Extensive carotid siphon atherosclerosis. No evidence of aneurysm or significant stenosis. No major vessel cut off.  Left anterior circulation: Chronic occlusion of the left ICA to the level of the supraclinoid carotid, were there is reconstitution via the posterior  communicating artery. Left A1 is hypoplastic. The left A2 is chronically attenuated. No high-grade stenosis or major vessel occlusion seen more distally.  Remote infarcts in the left frontal lobe and right parasagittal lobe. It is difficult to clearly see infarct in the parasagittal left occipital lobe, although there is likely acute ischemia given the history and angiographic findings. There is also subtle gray-white differentiation loss in this region. No abnormal intracranial enhancement. Patent dural venous sinuses.  Review of the MIP images confirms the above findings.  CTA NECK FINDINGS  Intrathoracic imaging notable for mediastinal and bilateral hilar lymphadenopathy. From chest CT 10/12/2002 report, this is a chronic finding. There is an irregularly marginated 7 mm nodule in the right upper lobe. Apical emphysema.  Two vessel aortic arch. Bilateral vertebral arteries show mild luminal irregularity and mural calcifications, consistent with atherosclerosis. No significant stenosis.  There is extensive calcified and noncalcified plaque throughout the right carotid system, including the common carotid artery. There is kinking of the right internal carotid artery approximate 2 cm beyond the bifurcation, with kink and atherosclerosis causing 50-60% stenosis. No evidence of ulcerated plaque or dissection.  There is extensive atherosclerosis throughout the left common carotid artery. Chronic occlusion of the left ICA just beyond the bifurcation. The ICA is occluded to the supraclinoid segment, were there is reconstitution as noted below. This finding is chronic based on 2010 MRA.  Critical results were called by telephone at the time of interpretation on 02/02/2013 at 10:59 PM to Dr. Alexis Goodell , who verbally acknowledged these results.  Review of the MIP images confirms the above findings.  IMPRESSION: 1. Distal left P2 occlusion. No definite occipital infarct by CT, MRI MRI could confirm the presence of  infarction. 2. 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 3. 50-60% stenosis of the cervical right ICA secondary to atherosclerotic plaque and vessel kinking. 4. 7 mm right upper lobe pulmonary nodule. Recommend chest CT followup in 3 months. 5. Mediastinal and hilar  lymphadenopathy, chronic based on reporting from 2004. Is there history of sarcoidosis?   Electronically Signed   By: Jorje Guild M.D.   On: 02/02/2013 23:18   Ct Head Wo Contrast  02/02/2013   CLINICAL DATA:  Vision changes  EXAM: CT HEAD WITHOUT CONTRAST  TECHNIQUE: Contiguous axial images were obtained from the base of the skull through the vertex without intravenous contrast.  COMPARISON:  Prior MRI from 12/09/2008 and prior CT from 12/08/2008  FINDINGS: Wedge-shaped encephalomalacia within the left frontal lobe and right occipital lobe is seen, consistent with remote infarct. Remote left cerebral watershed infarct also present. Additional scattered and confluent hypodensity within the periventricular and deep white matter are most consistent with chronic microvascular ischemic change. No definite acute large vessel territory infarct. There is no acute intracranial hemorrhage.  No mass lesion or midline shift. No hydrocephalus. There is no extra-axial fluid collection.  The calvarium is intact. Orbits are normal. Paranasal sinuses and mastoid air cells are clear.  IMPRESSION: 1. No acute intracranial abnormality. 2. Remote infarcts involving the left frontal and parietal lobes as well as the right occipital lobe. 3. Atrophy with chronic microvascular ischemic changes.   Electronically Signed   By: Jeannine Boga M.D.   On: 02/02/2013 19:26   Ct Angio Neck W/cm &/or Wo/cm  02/02/2013   CLINICAL DATA:  Vision changes.  Rule out straw  EXAM: CT ANGIOGRAPHY HEAD AND NECK  TECHNIQUE: Multidetector CT imaging of the head and neck was performed using the standard  protocol during bolus administration of intravenous contrast. Multiplanar CT image reconstructions including MIPs were obtained to evaluate the vascular anatomy. Carotid stenosis measurements (when applicable) are obtained utilizing NASCET criteria, using the distal internal carotid diameter as the denominator.  CONTRAST:  71mL OMNIPAQUE IOHEXOL 350 MG/ML SOLN  COMPARISON:  Head CT from the same day. Brain MRI and MRA 12/09/2008  FINDINGS: CTA HEAD FINDINGS  Posterior circulation: Most notable for abrupt vessel cut off of the distal left P2. Codominant vertebral arteries. No aneurysm or significant stenosis.  Right anterior circulation: Extensive carotid siphon atherosclerosis. No evidence of aneurysm or significant stenosis. No major vessel cut off.  Left anterior circulation: Chronic occlusion of the left ICA to the level of the supraclinoid carotid, were there is reconstitution via the posterior communicating artery. Left A1 is hypoplastic. The left A2 is chronically attenuated. No high-grade stenosis or major vessel occlusion seen more distally.  Remote infarcts in the left frontal lobe and right parasagittal lobe. It is difficult to clearly see infarct in the parasagittal left occipital lobe, although there is likely acute ischemia given the history and angiographic findings. There is also subtle gray-white differentiation loss in this region. No abnormal intracranial enhancement. Patent dural venous sinuses.  Review of the MIP images confirms the above findings.  CTA NECK FINDINGS  Intrathoracic imaging notable for mediastinal and bilateral hilar lymphadenopathy. From chest CT 10/12/2002 report, this is a chronic finding. There is an irregularly marginated 7 mm nodule in the right upper lobe. Apical emphysema.  Two vessel aortic arch. Bilateral vertebral arteries show mild luminal irregularity and mural calcifications, consistent with atherosclerosis. No significant stenosis.  There is extensive calcified and  noncalcified plaque throughout the right carotid system, including the common carotid artery. There is kinking of the right internal carotid artery approximate 2 cm beyond the bifurcation, with kink and atherosclerosis causing 50-60% stenosis. No evidence of ulcerated plaque or dissection.  There is extensive atherosclerosis throughout the left common carotid  artery. Chronic occlusion of the left ICA just beyond the bifurcation. The ICA is occluded to the supraclinoid segment, were there is reconstitution as noted below. This finding is chronic based on 2010 MRA.  Critical results were called by telephone at the time of interpretation on 02/02/2013 at 10:59 PM to Dr. Alexis Goodell , who verbally acknowledged these results.  Review of the MIP images confirms the above findings.  IMPRESSION: 1. Distal left P2 occlusion. No definite occipital infarct by CT, MRI MRI could confirm the presence of infarction. 2. 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 3. 50-60% stenosis of the cervical right ICA secondary to atherosclerotic plaque and vessel kinking. 4. 7 mm right upper lobe pulmonary nodule. Recommend chest CT followup in 3 months. 5. Mediastinal and hilar lymphadenopathy, chronic based on reporting from 2004. Is there history of sarcoidosis?   Electronically Signed   By: Jorje Guild M.D.   On: 02/02/2013 23:18   Mr Brain Wo Contrast  02/03/2013   CLINICAL DATA:  Vision changes.  Rule out stroke.  EXAM: MRI HEAD WITHOUT CONTRAST  TECHNIQUE: Multiplanar, multiecho pulse sequences of the brain and surrounding structures were obtained without intravenous contrast.  COMPARISON:  Head CT 02/02/2013 and brain MRI 12/09/2008  FINDINGS: There is a 3.9 cm focus of acute infarct involving the medial left occipital lobe with extension into the posterior medial left parietal lobe. There are also 2 subcentimeter foci of acute infarct involving the  left thalamus. Left frontoparietal encephalomalacia and right occipital encephalomalacia are consistent with remote infarcts. Incidental note is made of a cavum septum pellucidum et vergae. There is ex vacuo dilatation of the occipital horn of the right lateral ventricle. There is no midline shift, mass, intracranial hemorrhage, or extra-axial fluid collection. Absent intracranial left ICA flow void is consistent with known occlusion. Orbits are unremarkable. Paranasal sinuses and mastoid air cells are clear.  IMPRESSION: 1. Acute left PCA infarcts involving the left parieto-occipital region and left thalamus. 2. Remote left MCA and right PCA infarcts.   Electronically Signed   By: Logan Bores   On: 02/03/2013 19:54    Microbiology: No results found for this or any previous visit (from the past 240 hour(s)).   Labs: Basic Metabolic Panel:  Recent Labs Lab 02/02/13 1845 02/02/13 1926 02/04/13 0535  NA 139 140 138  K 5.3 5.2 4.1  CL 102 106 101  CO2 26  --  21  GLUCOSE 152* 152* 152*  BUN 26* 27* 17  CREATININE 1.33* 1.40* 1.00  CALCIUM 9.8  --  10.1   Liver Function Tests:  Recent Labs Lab 02/02/13 1845  AST 22  ALT 11  ALKPHOS 93  BILITOT 0.4  PROT 7.9  ALBUMIN 3.7   No results found for this basename: LIPASE, AMYLASE,  in the last 168 hours No results found for this basename: AMMONIA,  in the last 168 hours CBC:  Recent Labs Lab 02/02/13 1845 02/02/13 1926 02/04/13 0535  WBC 8.8  --  6.2  NEUTROABS 6.1  --   --   HGB 14.1 15.6* 14.8  HCT 42.1 46.0 43.4  MCV 87.2  --  84.8  PLT 184  --  198   Cardiac Enzymes:  Recent Labs Lab 02/02/13 1845  TROPONINI <0.30   BNP: BNP (last 3 results) No results found for this basename: PROBNP,  in the last 8760 hours CBG:  Recent Labs Lab 02/06/13 0740  02/06/13 1125 02/06/13 1628 02/06/13 2015 02/07/13 0757  GLUCAP 139* 147* 137* 224* 162*       Signed:  [place] Pager: [phone] 02/07/2013, 9:59 AM

## 2013-02-09 ENCOUNTER — Encounter: Payer: Self-pay | Admitting: Internal Medicine

## 2013-02-09 ENCOUNTER — Non-Acute Institutional Stay (SKILLED_NURSING_FACILITY): Payer: Medicare Other | Admitting: Internal Medicine

## 2013-02-09 DIAGNOSIS — F0390 Unspecified dementia without behavioral disturbance: Secondary | ICD-10-CM

## 2013-02-09 DIAGNOSIS — H539 Unspecified visual disturbance: Secondary | ICD-10-CM

## 2013-02-09 DIAGNOSIS — E785 Hyperlipidemia, unspecified: Secondary | ICD-10-CM

## 2013-02-09 DIAGNOSIS — I509 Heart failure, unspecified: Secondary | ICD-10-CM

## 2013-02-09 DIAGNOSIS — G589 Mononeuropathy, unspecified: Secondary | ICD-10-CM

## 2013-02-09 DIAGNOSIS — I4891 Unspecified atrial fibrillation: Secondary | ICD-10-CM

## 2013-02-09 DIAGNOSIS — E1149 Type 2 diabetes mellitus with other diabetic neurological complication: Secondary | ICD-10-CM

## 2013-02-09 DIAGNOSIS — E1140 Type 2 diabetes mellitus with diabetic neuropathy, unspecified: Secondary | ICD-10-CM

## 2013-02-09 DIAGNOSIS — I10 Essential (primary) hypertension: Secondary | ICD-10-CM

## 2013-02-09 DIAGNOSIS — I482 Chronic atrial fibrillation, unspecified: Secondary | ICD-10-CM

## 2013-02-09 DIAGNOSIS — Z7901 Long term (current) use of anticoagulants: Secondary | ICD-10-CM

## 2013-02-09 DIAGNOSIS — I69398 Other sequelae of cerebral infarction: Principal | ICD-10-CM

## 2013-02-09 DIAGNOSIS — R911 Solitary pulmonary nodule: Secondary | ICD-10-CM

## 2013-02-09 DIAGNOSIS — I69998 Other sequelae following unspecified cerebrovascular disease: Secondary | ICD-10-CM

## 2013-02-09 DIAGNOSIS — I5022 Chronic systolic (congestive) heart failure: Secondary | ICD-10-CM

## 2013-02-09 DIAGNOSIS — R569 Unspecified convulsions: Secondary | ICD-10-CM

## 2013-02-09 NOTE — Assessment & Plan Note (Signed)
H/o - continue Keppra

## 2013-02-09 NOTE — Assessment & Plan Note (Addendum)
New stroke in L PCA area -resulting in cortical blindness;w/u-carotid doppler-occluded L ICA,with reconstitution via left posterior communicating artery and  40% occlusion R ICA; neurology consulted and signed off Continue Statin; pt on warfarin for A fib and ASA

## 2013-02-09 NOTE — Assessment & Plan Note (Signed)
Pt on aricept 10 mg-continue

## 2013-02-09 NOTE — Assessment & Plan Note (Signed)
Controlled on metoprolol, vasotec and norvasc

## 2013-02-09 NOTE — Assessment & Plan Note (Signed)
HbA1c -7.0;controlled, continue present regimen of glargine and glucophage Pt in ACE and statin

## 2013-02-09 NOTE — Progress Notes (Signed)
MRN: [id number] Name: Casey [name]  Sex: female Age: 73 y.o. DOB: [birth year]  [place] #:  [facility] Facility/Room: 220 Level Of Care: SNF Provider: Inocencio Homes D Emergency Contacts: Extended Emergency Contact Information Primary Emergency Contact: Judd Lien of [place] Phone: [phone] Relation: Daughter Secondary Emergency Contact: Alfonzo Beers, [place] of [place] Phone: [phone] Mobile Phone: [phone] Relation: Niece  Code Status: FULL  Allergies: Penicillins  Chief Complaint  Patient presents with  . nursing home admission    HPI: Patient is 73 y.o. 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 Pt also has severe dementia and A FIB ON COUMADIN.  Past Medical History  Diagnosis Date  . Chronic atrial fibrillation   . Stroke   . Hypertension   . Hypercholesterolemia   . Diabetes mellitus   . Sinus of Valsalva aneurysm     a. By 2D echo 05/2011.  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  . CHF (congestive heart failure)   . GERD (gastroesophageal reflux disease)   . Urinary frequency   . Hypopotassemia   . Long term (current) use of anticoagulants   . Unspecified disorder of kidney and ureter   . Unspecified hereditary and idiopathic peripheral neuropathy   . Unspecified late effects of cerebrovascular disease   . Edema   . Peripheral vascular disease, unspecified   . Unspecified urinary incontinence   . Congestive heart failure, unspecified   . Gout, unspecified   . Abdominal or pelvic swelling, mass, or lump, left upper quadrant   . Vascular dementia, uncomplicated   . Electrolyte and fluid disorders not elsewhere classified   . Hypopotassemia   . Long term (current) use of anticoagulants   . Chronic kidney disease   .  Unspecified hereditary and idiopathic peripheral neuropathy   . Unspecified late effects of cerebrovascular disease   . Coronary atherosclerosis of unspecified type of vessel, native or graft   . Peripheral vascular disease, unspecified   . Gout, unspecified   . Seizures     Past Surgical History  Procedure Laterality Date  . Femoral to femoral bypass graft     . Femoral artery exploration  12/06/2011    Procedure: FEMORAL ARTERY EXPLORATION;  Surgeon: Angelia Mould, MD;  Location: [facility] OR;  Service: Vascular;  Laterality: N/A;  Exploration of large pseudoaneurysm left side of fem-fem bypass graft   . Femoral-femoral bypass graft  12/06/2011    Procedure: BYPASS GRAFT FEMORAL-FEMORAL ARTERY;  Surgeon: Angelia Mould, MD;  Location: [facility] OR;  Service: Vascular;  Laterality: Bilateral;  Revision of left to right Femoral-Femoral bypass graft  . False aneurysm repair  12/06/2011    Procedure: REPAIR FALSE ANEURYSM;  Surgeon: Angelia Mould, MD;  Location: [place];  Service: Vascular;  Laterality: Left;  Repair of left femoral Artery pseudoaneurysm  . Femoral-popliteal bypass graft Left 05/2002    lower extremity femoral to below knee w/ non-versed greater saphenous vein  . Femoral-popliteal bypass graft Left 11/2002  . Tubal ligation Bilateral   . Aortogram  09/01/2008    Abd w/ bilateral lower extremity runoff arteriography      Medication List       This list is accurate as of: 02/09/13  1:07 PM.  Always use your  most recent med list.               allopurinol 100 MG tablet  Commonly known as:  ZYLOPRIM  Take 100 mg by mouth daily.     amLODipine 5 MG tablet  Commonly known as:  NORVASC  Take 5 mg by mouth daily.     aspirin 81 MG chewable tablet  Chew 1 tablet (81 mg total) by mouth daily.     digoxin 0.125 MG tablet  Commonly known as:  LANOXIN  Take 0.125 mg by mouth daily.     donepezil 10 MG tablet  Commonly known as:  ARICEPT  Take 1 tablet (10 mg  total) by mouth at bedtime.     enalapril 2.5 MG tablet  Commonly known as:  VASOTEC  Take 1 tablet (2.5 mg total) by mouth daily.     furosemide 20 MG tablet  Commonly known as:  LASIX  Take 20 mg by mouth daily.     glucose blood test strip  Commonly known as:  TRUETEST TEST  Test blood sugar twice daily to control blood sugar. 250.00     insulin glargine 100 UNIT/ML injection  Commonly known as:  LANTUS  Inject 12 Units into the skin at bedtime.     Insulin Syringe-Needle U-100 31G X 5/16" 0.3 ML Misc  Commonly known as:  BD INSULIN SYRINGE ULTRAFINE  USE TO GIVE NOVOLOG AND LANTUS INSULIN 4 TIMES A DAY.     levETIRAcetam 250 MG tablet  Commonly known as:  KEPPRA  Take 250 mg by mouth 2 (two) times daily.     metFORMIN 500 MG tablet  Commonly known as:  GLUCOPHAGE  Take 500 mg by mouth 2 (two) times daily with a meal.     metoprolol succinate 50 MG 24 hr tablet  Commonly known as:  TOPROL-XL  Take 50 mg by mouth 2 (two) times daily. Take with or immediately following a meal.     oxybutynin 5 MG tablet  Commonly known as:  DITROPAN  Take 1 tablet (5 mg total) by mouth 2 (two) times daily.     potassium chloride 10 MEQ tablet  Commonly known as:  K-DUR,KLOR-CON  Take 10 mEq by mouth daily.     simvastatin 20 MG tablet  Commonly known as:  ZOCOR  Take 1 tablet (20 mg total) by mouth daily.     traMADol 50 MG tablet  Commonly known as:  ULTRAM  Take 1 tablet (50 mg total) by mouth 3 (three) times daily as needed for moderate pain.     warfarin 5 MG tablet  Commonly known as:  COUMADIN  Take 2.5-5 mg by mouth daily. Take 1/2 tablet (2.5 mg) on Tuesday and Saturday, take 1 tablet (5 mg) on Sunday, Monday, Wednesday, Thursday, Friday.        No orders of the defined types were placed in this encounter.    Immunization History  Administered Date(s) Administered  . Influenza Split 12/09/2011  . Pneumococcal Polysaccharide-23 02/05/2013    History   Substance Use Topics  . Smoking status: Former Smoker    Types: Cigarettes    Quit date: 01/29/2001  . Smokeless tobacco: Never Used  . Alcohol Use: No    Family history is noncontributory    Review of Systems  DATA OBTAINED: from patient, nurse, GENERAL: Feels well no fevers, fatigue, appetite changes SKIN: No itching, rash  EYES: No eye pain, redness, discharge EARS: No earache, tinnitus, change in  hearing NOSE: No congestion, drainage or bleeding  MOUTH/THROAT: No mouth or tooth pain, No sore throat, No difficulty chewing or swallowing  RESPIRATORY: No cough, wheezing, SOB CARDIAC: No chest pain, palpitations, lower extremity edema  GI: No abdominal pain, No N/V/D or constipation, No heartburn or reflux  GU: No dysuria, frequency or urgency, or incontinence  MUSCULOSKELETAL: No unrelieved bone/joint pain NEUROLOGIC: No headache, dizziness or focal weakness PSYCHIATRIC: No overt anxiety or sadness. Sleeps well. No behavior issue.   Filed Vitals:   02/09/13 1229  BP: 131/69  Pulse: 62  Temp: 97.9 F (36.6 C)  Resp: 19    Physical Exam  GENERAL APPEARANCE: Alert, minimally conversant. Appropriately groomed. No acute distress.  SKIN: No diaphoresis rash HEAD: Normocephalic, atraumatic  EYES: Conjunctiva/lids clear. Pt blind B  EARS: External exam WNL, canals clear. Hearing grossly normal.  NOSE: No deformity or discharge.  MOUTH/THROAT: Lips w/o lesions.  RESPIRATORY: Breathing is even, unlabored. Lung sounds are clear   CARDIOVASCULAR: Heart RRR no murmurs, rubs or gallops. No peripheral edema.  GASTROINTESTINAL: Abdomen is soft, non-tender, not distended w/ normal bowel sounds. GENITOURINARY: Bladder non tender, not distended  MUSCULOSKELETAL: No abnormal joints or musculature NEUROLOGIC: Oriented X1. Cranial nerves 2-12 grossly intact.except sight Moves all extremities no tremor. PSYCHIATRIC: Mood and affect appropriate to situation, no behavioral  issues  Patient Active Problem List   Diagnosis Date Noted  . Chronic systolic congestive heart failure 02/09/2013  . Solitary pulmonary nodule 02/09/2013  . Seizures   . Vision disturbance following CVA (cerebrovascular accident) 02/02/2013  . PVD (peripheral vascular disease) 09/30/2012  . Bunion 06/10/2012  . Long term (current) use of anticoagulants 06/09/2012  . Gout 05/06/2012  . Dementia without behavioral disturbance 05/06/2012  . Hyperkalemia 05/06/2012  . 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 Uncontrolled type 2 DM with hyperosmolar nonketotic hyperglycemia 06/18/2011  . UTI (lower urinary tract infection) 06/18/2011  . HTN (hypertension), benign 06/18/2011  . A-fib 06/18/2011  . Current use of long term anticoagulation 06/18/2011  . Hyperlipidemia 06/18/2011  . AKI (acute kidney injury) 06/18/2011  . Type 2 diabetes mellitus with diabetic neuropathy 12/29/2010  . Chronic atrial fibrillation   . Stroke   . Hypertension   . Hypercholesterolemia   . 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 Atrial fibrillation 04/26/2010    CBC    Component Value Date/Time   WBC 6.2 02/04/2013 0535   RBC 5.12* 02/04/2013 0535   HGB 14.8 02/04/2013 0535   HCT 43.4 02/04/2013 0535   PLT 198 02/04/2013 0535   MCV 84.8 02/04/2013 0535   LYMPHSABS 1.8 02/02/2013 1845   MONOABS 0.6 02/02/2013 1845   EOSABS 0.3 02/02/2013 1845   BASOSABS 0.1 02/02/2013 1845    CMP     Component Value Date/Time   NA 138 02/04/2013 0535   NA 140 05/06/2012 1140   K 4.1 02/04/2013 0535   CL 101 02/04/2013 0535   CO2 21 02/04/2013 0535   GLUCOSE 152* 02/04/2013 0535   GLUCOSE 221* 05/06/2012 1140   BUN 17 02/04/2013 0535   BUN 24 05/06/2012 1140   CREATININE 1.00 02/04/2013 0535   CALCIUM 10.1 02/04/2013 0535   PROT 7.9 02/02/2013 1845   PROT 7.6 04/28/2012 1117   ALBUMIN 3.7 02/02/2013 1845   AST 22 02/02/2013 1845   ALT 11 02/02/2013  1845   ALKPHOS 93 02/02/2013 1845   BILITOT 0.4 02/02/2013  1845   GFRNONAA 55* 02/04/2013 0535   GFRAA 64* 02/04/2013 0535    Assessment and Plan  Vision disturbance following CVA (cerebrovascular accident) New stroke in L PCA area -resulting in cortical blindness;w/u-carotid doppler-occluded L ICA,with reconstitution via left posterior communicating artery and  40% occlusion R ICA; neurology consulted and signed off Continue Statin; pt on warfarin for A fib and ASA  Chronic systolic congestive heart failure ECHO- EF 35-40% with diffuse hypokinesis Continue with lasix, ACE and B Blocker  Chronic atrial fibrillation Metoprolol and dig for rate control; pt is on warfarin chronically which stays sub theraputic Continue ASA and statin  Type 2 diabetes mellitus with diabetic neuropathy HbA1c -7.0;controlled, continue present regimen of glargine and glucophage Pt in ACE and statin  Hypertension Controlled on metoprolol, vasotec and norvasc  Hyperlipidemia LDL 89,HDL 72-excellent;continue zocor 20 mg  Dementia without behavioral disturbance Pt on aricept 10 mg-continue  Seizures H/o - continue Keppra  Current use of long term anticoagulation For afib and CVA recurrence - hard to keep theraputic INR  Solitary pulmonary nodule Repeat CT in 3 months    Hennie Duos, MD

## 2013-02-09 NOTE — Assessment & Plan Note (Signed)
ECHO- EF 35-40% with diffuse hypokinesis Continue with lasix, ACE and B Blocker

## 2013-02-09 NOTE — Assessment & Plan Note (Signed)
LDL 89,HDL 72-excellent;continue zocor 20 mg

## 2013-02-09 NOTE — Assessment & Plan Note (Signed)
For afib and CVA recurrence - hard to keep theraputic INR

## 2013-02-09 NOTE — Assessment & Plan Note (Signed)
Metoprolol and dig for rate control; pt is on warfarin chronically which stays sub theraputic Continue ASA and statin

## 2013-02-09 NOTE — Assessment & Plan Note (Signed)
-  Repeat CT in 3 months

## 2013-03-03 ENCOUNTER — Non-Acute Institutional Stay (SKILLED_NURSING_FACILITY): Payer: Medicare Other | Admitting: Nurse Practitioner

## 2013-03-03 DIAGNOSIS — I4891 Unspecified atrial fibrillation: Secondary | ICD-10-CM

## 2013-03-03 DIAGNOSIS — E1149 Type 2 diabetes mellitus with other diabetic neurological complication: Secondary | ICD-10-CM

## 2013-03-03 DIAGNOSIS — F0390 Unspecified dementia without behavioral disturbance: Secondary | ICD-10-CM

## 2013-03-03 DIAGNOSIS — R569 Unspecified convulsions: Secondary | ICD-10-CM

## 2013-03-03 DIAGNOSIS — E785 Hyperlipidemia, unspecified: Secondary | ICD-10-CM

## 2013-03-03 DIAGNOSIS — E1140 Type 2 diabetes mellitus with diabetic neuropathy, unspecified: Secondary | ICD-10-CM

## 2013-03-03 DIAGNOSIS — I6990 Unspecified sequelae of unspecified cerebrovascular disease: Secondary | ICD-10-CM

## 2013-03-03 DIAGNOSIS — I10 Essential (primary) hypertension: Secondary | ICD-10-CM

## 2013-03-03 DIAGNOSIS — M109 Gout, unspecified: Secondary | ICD-10-CM

## 2013-03-03 NOTE — Progress Notes (Signed)
Patient ID: Casey [name], female   DOB: 10/01/1940, 73 y.o.   MRN: [id number]    Nursing Home Location:  [place] of Service: SNF (31)  PCP: Blanchie Serve, MD  Allergies  Allergen Reactions  . Penicillins Other (See Comments)    Patient states that she was told previously by a doctor to not take this medication but is unsure why.     Chief Complaint  Patient presents with  . Medical Managment of Chronic Issues    HPI:  73 year old African American woman with a history of atrial fibrillation, prior embolic strokes. 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  HENT: Negative for congestion.   Respiratory: Negative for cough and shortness of breath.   Cardiovascular: Negative for chest pain, palpitations and leg swelling.  Gastrointestinal: Negative for heartburn, abdominal pain and constipation.  Genitourinary: Negative for dysuria.  Musculoskeletal: Negative for falls.  Skin: Negative for rash.  Neurological: Negative for dizziness, tremors, weakness and headaches.  Psychiatric/Behavioral: Negative for depression.     Past Medical History  Diagnosis Date  . Chronic atrial fibrillation   . Stroke   . Hypertension   . Hypercholesterolemia   . Diabetes mellitus   . Sinus of Valsalva aneurysm     a. By 2D echo 05/2011.  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  . CHF (congestive heart failure)   . GERD (gastroesophageal reflux disease)   . Urinary frequency   . Hypopotassemia   . Long term (current) use of anticoagulants   . Unspecified disorder of kidney and ureter   . Unspecified hereditary and idiopathic peripheral neuropathy   . Unspecified late effects of cerebrovascular disease   . Edema   . Peripheral vascular disease, unspecified   . Unspecified urinary incontinence   . Congestive heart failure, unspecified   . Gout, unspecified   . Abdominal or pelvic swelling, mass, or lump, left upper quadrant   . Vascular dementia, uncomplicated   . Electrolyte and fluid disorders not elsewhere classified   . Hypopotassemia   . Long term (current) use of anticoagulants   . Chronic kidney disease   . Unspecified hereditary and idiopathic peripheral neuropathy   . Unspecified late effects of cerebrovascular disease   . Coronary atherosclerosis of unspecified type of vessel, native or graft   . Peripheral vascular disease, unspecified   . Gout, unspecified   . Seizures    Past Surgical History  Procedure Laterality Date  . Femoral to femoral bypass graft     . Femoral artery exploration  12/06/2011    Procedure: FEMORAL ARTERY EXPLORATION;  Surgeon: Angelia Mould, MD;  Location: [facility] OR;  Service: Vascular;  Laterality: N/A;  Exploration of large pseudoaneurysm left side of fem-fem bypass graft   . Femoral-femoral bypass graft  12/06/2011    Procedure: BYPASS GRAFT FEMORAL-FEMORAL ARTERY;  Surgeon: Angelia Mould, MD;  Location: [place];  Service: Vascular;  Laterality: Bilateral;  Revision of left to right Femoral-Femoral bypass graft  . False aneurysm repair  12/06/2011    Procedure: REPAIR FALSE ANEURYSM;  Surgeon: Angelia Mould, MD;  Location: [place];  Service: Vascular;  Laterality: Left;  Repair of left femoral Artery pseudoaneurysm  . Femoral-popliteal bypass graft Left 05/2002    lower extremity femoral  to below knee w/ non-versed greater saphenous vein  . Femoral-popliteal bypass graft Left 11/2002  . Tubal ligation Bilateral   . Aortogram  09/01/2008    Abd w/ bilateral lower extremity runoff arteriography   Social History:   reports that she quit smoking about 12 years ago. Her smoking use included Cigarettes. She smoked 0.00 packs per day. She has never used smokeless tobacco. She reports that she does not drink alcohol or use illicit drugs.  Family History  Problem Relation Age of Onset  . Cancer Mother     Cervical  . Stroke Mother   . Diabetes Mother   . Stroke Father   . Heart disease Father     Medications: Patient's Medications  New Prescriptions   No medications on file  Previous Medications   ALLOPURINOL (ZYLOPRIM) 100 MG TABLET    Take 100 mg by mouth daily.   AMLODIPINE (NORVASC) 5 MG TABLET    Take 5 mg by mouth daily.   ASPIRIN 81 MG CHEWABLE TABLET    Chew 1 tablet (81 mg total) by mouth daily.   DIGOXIN (LANOXIN) 0.125 MG TABLET    Take 0.125 mg by mouth daily.   DONEPEZIL (ARICEPT) 10 MG TABLET    Take 1 tablet (10 mg total) by mouth at bedtime.   ENALAPRIL (VASOTEC) 2.5 MG TABLET    Take 1 tablet (2.5 mg total) by mouth daily.   FUROSEMIDE (LASIX) 20 MG TABLET    Take 20 mg by mouth daily.   GLUCOSE BLOOD (TRUETEST TEST) TEST STRIP    Test blood sugar twice daily to control blood sugar. 250.00   INSULIN GLARGINE (LANTUS) 100 UNIT/ML INJECTION    Inject 12 Units into the skin at bedtime.   INSULIN SYRINGE-NEEDLE U-100 (BD INSULIN SYRINGE ULTRAFINE) 31G X 5/16" 0.3 ML MISC    USE TO GIVE NOVOLOG AND LANTUS INSULIN 4 TIMES A DAY.   LEVETIRACETAM (KEPPRA) 250 MG TABLET    Take 250 mg by mouth 2 (two) times daily.   METFORMIN (GLUCOPHAGE) 500 MG TABLET    Take 500 mg by mouth 2 (two) times daily with a meal.   METOPROLOL SUCCINATE (TOPROL-XL) 50 MG 24 HR TABLET    Take 50 mg by mouth 2 (two) times daily. Take with or immediately following a meal.   OXYBUTYNIN  (DITROPAN) 5 MG TABLET    Take 1 tablet (5 mg total) by mouth 2 (two) times daily.   POTASSIUM CHLORIDE (K-DUR,KLOR-CON) 10 MEQ TABLET    Take 10 mEq by mouth daily.   SIMVASTATIN (ZOCOR) 20 MG TABLET    Take 1 tablet (20 mg total) by mouth daily.   TRAMADOL (ULTRAM) 50 MG TABLET    Take 1 tablet (50 mg total) by mouth 3 (three) times daily as needed for moderate pain.   WARFARIN (COUMADIN) 5 MG TABLET    Take 2.5-5 mg by mouth daily. Take 1/2 tablet (2.5 mg) on Tuesday, Thursday and Saturday, take 1 tablet (5 mg) on Sunday, Monday, Wednesday, Friday.  Modified Medications   No medications on file  Discontinued Medications   No  medications on file     Physical Exam:  Filed Vitals:   03/03/13 1712  BP: 108/55  Pulse: 60  Temp: 98.4 F (36.9 C)  Resp: 20   Physical Exam  Constitutional: She is well-developed, well-nourished, and in no distress.  HENT:  Mouth/Throat: Oropharynx is clear and moist. No oropharyngeal exudate.  Neck: Normal range of motion. Neck supple. No thyromegaly present.  Cardiovascular: Normal rate and normal heart sounds.  An irregular rhythm present.  Pulmonary/Chest: Effort normal and breath sounds normal. No respiratory distress.  Abdominal: Soft. Bowel sounds are normal. She exhibits no distension.  Musculoskeletal: She exhibits no edema and no tenderness.  Neurological: She is alert.  Skin: Skin is warm and dry.  Psychiatric: Affect normal.      Labs reviewed: Basic Metabolic Panel:  Recent Labs  05/06/12 1140 02/02/13 1845 02/02/13 1926 02/04/13 0535  NA 140 139 140 138  K 4.6 5.3 5.2 4.1  CL 104 102 106 101  CO2 25 26  --  21  GLUCOSE 221* 152* 152* 152*  BUN 24 26* 27* 17  CREATININE 1.24* 1.33* 1.40* 1.00  CALCIUM 10.0 9.8  --  10.1   Liver Function Tests:  Recent Labs  04/28/12 1117 02/02/13 1845  AST 15 22  ALT 9 11  ALKPHOS 86 93  BILITOT 0.4 0.4  PROT 7.6 7.9  ALBUMIN  --  3.7   No results found for this basename:  LIPASE, AMYLASE,  in the last 8760 hours No results found for this basename: AMMONIA,  in the last 8760 hours CBC:  Recent Labs  02/02/13 1845 02/02/13 1926 02/04/13 0535  WBC 8.8  --  6.2  NEUTROABS 6.1  --   --   HGB 14.1 15.6* 14.8  HCT 42.1 46.0 43.4  MCV 87.2  --  84.8  PLT 184  --  198   Cardiac Enzymes:  Recent Labs  02/02/13 1845  TROPONINI <0.30   BNP: No components found with this basename: POCBNP,  CBG:  Recent Labs  02/06/13 2015 02/07/13 0757 02/07/13 1223  GLUCAP 224* 162* 149*   TSH: No results found for this basename: TSH,  in the last 8760 hours A1C: Lab Results  Component Value Date   HGBA1C 7.0* 02/03/2013   Lipid Panel:  Recent Labs  02/03/13 0500  CHOL 192  HDL 72  LDLCALC 89  TRIG 156*  CHOLHDL 2.7   Lab Results  Component Value Date   DIGOXIN 1.0 06/19/2011    Assessment/Plan 1. A-fib -rate controlled; conts coumadin, will follow up cbc and dig level   2. HTN (hypertension), benign -Patient is stable; continue current regimen. Will monitor and make changes as necessary.  3. Late effects of CVA (cerebrovascular accident) -conts ASA; with right sided hemiparesis and blindness   4. Type 2 diabetes mellitus with diabetic neuropathy -A1c of 7 in January; blood sugars reviewed and in appropriate range, no hypoglycemic episodes reported; will cont to monitor   5. Dementia without behavioral disturbance -advanced; does well in current environment; needs max assistance   6. Gout -no noted flares; will cont allopurinol  7. Hyperlipidemia -LDL at goal in January; will cont zocor  8. Seizures -no seizures noted; conts on keppra

## 2013-03-16 ENCOUNTER — Non-Acute Institutional Stay (SKILLED_NURSING_FACILITY): Payer: Medicare Other | Admitting: Internal Medicine

## 2013-03-16 ENCOUNTER — Encounter: Payer: Self-pay | Admitting: Internal Medicine

## 2013-03-16 DIAGNOSIS — E785 Hyperlipidemia, unspecified: Secondary | ICD-10-CM

## 2013-03-16 DIAGNOSIS — I69398 Other sequelae of cerebral infarction: Secondary | ICD-10-CM

## 2013-03-16 DIAGNOSIS — I69998 Other sequelae following unspecified cerebrovascular disease: Secondary | ICD-10-CM

## 2013-03-16 DIAGNOSIS — I6990 Unspecified sequelae of unspecified cerebrovascular disease: Secondary | ICD-10-CM

## 2013-03-16 DIAGNOSIS — I10 Essential (primary) hypertension: Secondary | ICD-10-CM

## 2013-03-16 DIAGNOSIS — E1149 Type 2 diabetes mellitus with other diabetic neurological complication: Secondary | ICD-10-CM

## 2013-03-16 DIAGNOSIS — E7800 Pure hypercholesterolemia, unspecified: Secondary | ICD-10-CM

## 2013-03-16 DIAGNOSIS — Z7901 Long term (current) use of anticoagulants: Secondary | ICD-10-CM

## 2013-03-16 DIAGNOSIS — E1140 Type 2 diabetes mellitus with diabetic neuropathy, unspecified: Secondary | ICD-10-CM

## 2013-03-16 DIAGNOSIS — I509 Heart failure, unspecified: Secondary | ICD-10-CM

## 2013-03-16 DIAGNOSIS — R911 Solitary pulmonary nodule: Secondary | ICD-10-CM

## 2013-03-16 DIAGNOSIS — I482 Chronic atrial fibrillation, unspecified: Secondary | ICD-10-CM

## 2013-03-16 DIAGNOSIS — F0390 Unspecified dementia without behavioral disturbance: Secondary | ICD-10-CM

## 2013-03-16 DIAGNOSIS — H539 Unspecified visual disturbance: Secondary | ICD-10-CM

## 2013-03-16 DIAGNOSIS — I4891 Unspecified atrial fibrillation: Secondary | ICD-10-CM

## 2013-03-16 DIAGNOSIS — I5022 Chronic systolic (congestive) heart failure: Secondary | ICD-10-CM

## 2013-03-16 DIAGNOSIS — R569 Unspecified convulsions: Secondary | ICD-10-CM

## 2013-03-16 NOTE — Progress Notes (Signed)
MRN: [id number] Name: Casey [name]  Sex: female Age: 73 y.o. DOB: [birth year]  Donald #: Helene Kelp Facility/Room: 220 Level Of Care: SNF Provider: Inocencio Homes D Emergency Contacts: Extended Emergency Contact Information Primary Emergency Contact: Judd Lien of [place] Phone: [phone] Relation: Daughter Secondary Emergency Contact: Alfonzo Beers, [place] Phone: [phone] Mobile Phone: [phone] Relation: Niece  Code Status: FULL  Allergies: Penicillins  Chief Complaint  Patient presents with  . Discharge Note    HPI: Patient is 73 y.o. female who is being discharged to home.  Past Medical History  Diagnosis Date  . Chronic atrial fibrillation   . Stroke   . Hypertension   . Hypercholesterolemia   . Diabetes mellitus   . Sinus of Valsalva aneurysm     a. By 2D echo 05/2011.  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  . CHF (congestive heart failure)   . GERD (gastroesophageal reflux disease)   . Urinary frequency   . Hypopotassemia   . Long term (current) use of anticoagulants   . Unspecified disorder of kidney and ureter   . Unspecified hereditary and idiopathic peripheral neuropathy   . Unspecified late effects of cerebrovascular disease   . Edema   . Peripheral vascular disease, unspecified   . Unspecified urinary incontinence   . Congestive heart failure, unspecified   . Gout, unspecified   . Abdominal or pelvic swelling, mass, or lump, left upper quadrant   . Vascular dementia, uncomplicated   . Electrolyte and fluid disorders not elsewhere classified   . Hypopotassemia   . Long term (current) use of anticoagulants   . Chronic kidney disease   . Unspecified hereditary and idiopathic peripheral neuropathy   . Unspecified late effects of  cerebrovascular disease   . Coronary atherosclerosis of unspecified type of vessel, native or graft   . Peripheral vascular disease, unspecified   . Gout, unspecified   . Seizures     Past Surgical History  Procedure Laterality Date  . Femoral to femoral bypass graft     . Femoral artery exploration  12/06/2011    Procedure: FEMORAL ARTERY EXPLORATION;  Surgeon: Angelia Mould, MD;  Location: [facility] OR;  Service: Vascular;  Laterality: N/A;  Exploration of large pseudoaneurysm left side of fem-fem bypass graft   . Femoral-femoral bypass graft  12/06/2011    Procedure: BYPASS GRAFT FEMORAL-FEMORAL ARTERY;  Surgeon: Angelia Mould, MD;  Location: [facility] OR;  Service: Vascular;  Laterality: Bilateral;  Revision of left to right Femoral-Femoral bypass graft  . False aneurysm repair  12/06/2011    Procedure: REPAIR FALSE ANEURYSM;  Surgeon: Angelia Mould, MD;  Location: [facility];  Service: Vascular;  Laterality: Left;  Repair of left femoral Artery pseudoaneurysm  . Femoral-popliteal bypass graft Left 05/2002    lower extremity femoral to below knee w/ non-versed greater saphenous vein  . Femoral-popliteal bypass graft Left 11/2002  . Tubal ligation Bilateral   . Aortogram  09/01/2008    Abd w/ bilateral lower extremity runoff arteriography      Medication List       This list is accurate as of: 03/16/13 12:17 PM.  Always use your most recent med list.               allopurinol 100 MG  tablet  Commonly known as:  ZYLOPRIM  Take 100 mg by mouth daily.     amLODipine 5 MG tablet  Commonly known as:  NORVASC  Take 5 mg by mouth daily.     aspirin 81 MG chewable tablet  Chew 1 tablet (81 mg total) by mouth daily.     digoxin 0.125 MG tablet  Commonly known as:  LANOXIN  Take 0.125 mg by mouth daily.     donepezil 10 MG tablet  Commonly known as:  ARICEPT  Take 1 tablet (10 mg total) by mouth at bedtime.     enalapril 2.5 MG tablet  Commonly known as:  VASOTEC  Take 1  tablet (2.5 mg total) by mouth daily.     furosemide 20 MG tablet  Commonly known as:  LASIX  Take 20 mg by mouth daily.     glucose blood test strip  Commonly known as:  TRUETEST TEST  Test blood sugar twice daily to control blood sugar. 250.00     insulin glargine 100 UNIT/ML injection  Commonly known as:  LANTUS  Inject 12 Units into the skin at bedtime.     Insulin Syringe-Needle U-100 31G X 5/16" 0.3 ML Misc  Commonly known as:  BD INSULIN SYRINGE ULTRAFINE  USE TO GIVE NOVOLOG AND LANTUS INSULIN 4 TIMES A DAY.     levETIRAcetam 250 MG tablet  Commonly known as:  KEPPRA  Take 250 mg by mouth 2 (two) times daily.     metFORMIN 500 MG tablet  Commonly known as:  GLUCOPHAGE  Take 500 mg by mouth 2 (two) times daily with a meal.     metoprolol succinate 50 MG 24 hr tablet  Commonly known as:  TOPROL-XL  Take 50 mg by mouth 2 (two) times daily. Take with or immediately following a meal.     oxybutynin 5 MG tablet  Commonly known as:  DITROPAN  Take 1 tablet (5 mg total) by mouth 2 (two) times daily.     potassium chloride 10 MEQ tablet  Commonly known as:  K-DUR,KLOR-CON  Take 10 mEq by mouth daily.     simvastatin 20 MG tablet  Commonly known as:  ZOCOR  Take 1 tablet (20 mg total) by mouth daily.     traMADol 50 MG tablet  Commonly known as:  ULTRAM  Take 1 tablet (50 mg total) by mouth 3 (three) times daily as needed for moderate pain.     warfarin 5 MG tablet  Commonly known as:  COUMADIN  Take 2.5-5 mg by mouth daily. Take 1/2 tablet (2.5 mg) on Tuesday, Thursday and Saturday, take 1 tablet (5 mg) on Sunday, Monday, Wednesday, Friday.        No orders of the defined types were placed in this encounter.    Immunization History  Administered Date(s) Administered  . Influenza Split 12/09/2011  . Pneumococcal Polysaccharide-23 02/05/2013    History  Substance Use Topics  . Smoking status: Former Smoker    Types: Cigarettes    Quit date: 01/29/2001   . Smokeless tobacco: Never Used  . Alcohol Use: No    Filed Vitals:   03/16/13 1141  BP: 112/54  Pulse: 80  Temp: 97.4 F (36.3 C)  Resp: 20    Physical Exam  GENERAL APPEARANCE: Alert, conversant. Appropriately groomed. No acute distress.  HEENT: Unremarkable. RESPIRATORY: Breathing is even, unlabored. Lung sounds are clear   CARDIOVASCULAR: Heart RRR no murmurs, rubs or gallops. No peripheral edema.  GASTROINTESTINAL: Abdomen is soft, non-tender, not distended w/ normal bowel sounds.  NEUROLOGIC: blind, Moves all extremities no tremor.  Patient Active Problem List   Diagnosis Date Noted  . Chronic systolic congestive heart failure 02/09/2013  . Solitary pulmonary nodule 02/09/2013  . Seizures   . Vision disturbance following CVA (cerebrovascular accident) 02/02/2013  . PVD (peripheral vascular disease) 09/30/2012  . Bunion 06/10/2012  . Long term (current) use of anticoagulants 06/09/2012  . Gout 05/06/2012  . Dementia without behavioral disturbance 05/06/2012  . Hyperkalemia 05/06/2012  . 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 Uncontrolled type 2 DM with hyperosmolar nonketotic hyperglycemia 06/18/2011  . UTI (lower urinary tract infection) 06/18/2011  . HTN (hypertension), benign 06/18/2011  . A-fib 06/18/2011  . Current use of long term anticoagulation 06/18/2011  . Hyperlipidemia 06/18/2011  . AKI (acute kidney injury) 06/18/2011  . Type 2 diabetes mellitus with diabetic neuropathy 12/29/2010  . Chronic atrial fibrillation   . Late effects of CVA (cerebrovascular accident)   . Hypertension   . Hypercholesterolemia   . 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 Atrial fibrillation 04/26/2010    CBC    Component Value Date/Time   WBC 6.2 02/04/2013 0535   RBC 5.12* 02/04/2013 0535   HGB 14.8 02/04/2013 0535   HCT 43.4 02/04/2013 0535   PLT 198 02/04/2013 0535    MCV 84.8 02/04/2013 0535   LYMPHSABS 1.8 02/02/2013 1845   MONOABS 0.6 02/02/2013 1845   EOSABS 0.3 02/02/2013 1845   BASOSABS 0.1 02/02/2013 1845    CMP     Component Value Date/Time   NA 138 02/04/2013 0535   NA 140 05/06/2012 1140   K 4.1 02/04/2013 0535   CL 101 02/04/2013 0535   CO2 21 02/04/2013 0535   GLUCOSE 152* 02/04/2013 0535   GLUCOSE 221* 05/06/2012 1140   BUN 17 02/04/2013 0535   BUN 24 05/06/2012 1140   CREATININE 1.00 02/04/2013 0535   CALCIUM 10.1 02/04/2013 0535   PROT 7.9 02/02/2013 1845   PROT 7.6 04/28/2012 1117   ALBUMIN 3.7 02/02/2013 1845   AST 22 02/02/2013 1845   ALT 11 02/02/2013 1845   ALKPHOS 93 02/02/2013 1845   BILITOT 0.4 02/02/2013 1845   GFRNONAA 55* 02/04/2013 0535   GFRAA 64* 02/04/2013 0535    Assessment and Plan  Patient is being discharged to home with her daughter in stable condition with HH,PT,OT.  Hennie Duos, MD

## 2013-03-24 ENCOUNTER — Ambulatory Visit: Payer: Medicare Other | Admitting: Internal Medicine

## 2013-03-26 ENCOUNTER — Other Ambulatory Visit: Payer: Self-pay | Admitting: Vascular Surgery

## 2013-03-26 DIAGNOSIS — I739 Peripheral vascular disease, unspecified: Secondary | ICD-10-CM

## 2013-03-26 DIAGNOSIS — Z48812 Encounter for surgical aftercare following surgery on the circulatory system: Secondary | ICD-10-CM

## 2013-04-01 ENCOUNTER — Ambulatory Visit: Payer: Medicare Other | Admitting: Internal Medicine

## 2013-04-02 DIAGNOSIS — I4891 Unspecified atrial fibrillation: Secondary | ICD-10-CM

## 2013-04-02 DIAGNOSIS — E1142 Type 2 diabetes mellitus with diabetic polyneuropathy: Secondary | ICD-10-CM

## 2013-04-02 DIAGNOSIS — E1149 Type 2 diabetes mellitus with other diabetic neurological complication: Secondary | ICD-10-CM

## 2013-04-02 DIAGNOSIS — G40909 Epilepsy, unspecified, not intractable, without status epilepticus: Secondary | ICD-10-CM

## 2013-04-14 ENCOUNTER — Encounter: Payer: Self-pay | Admitting: Cardiology

## 2013-09-07 ENCOUNTER — Other Ambulatory Visit: Payer: Self-pay | Admitting: Internal Medicine

## 2013-10-06 ENCOUNTER — Other Ambulatory Visit (HOSPITAL_COMMUNITY): Payer: Medicare Other

## 2013-10-06 ENCOUNTER — Ambulatory Visit: Payer: Medicare Other | Admitting: Family

## 2013-10-06 ENCOUNTER — Encounter (HOSPITAL_COMMUNITY): Payer: Medicare Other

## 2013-10-15 ENCOUNTER — Encounter: Payer: Self-pay | Admitting: Family

## 2013-10-16 ENCOUNTER — Ambulatory Visit: Payer: Medicare Other | Admitting: Family

## 2013-10-16 ENCOUNTER — Other Ambulatory Visit (HOSPITAL_COMMUNITY): Payer: Medicare Other

## 2013-10-16 ENCOUNTER — Encounter (HOSPITAL_COMMUNITY): Payer: Medicare Other

## 2014-03-09 ENCOUNTER — Non-Acute Institutional Stay (SKILLED_NURSING_FACILITY): Payer: Medicare Other | Admitting: Internal Medicine

## 2014-03-09 DIAGNOSIS — R32 Unspecified urinary incontinence: Secondary | ICD-10-CM | POA: Diagnosis not present

## 2014-03-09 DIAGNOSIS — H548 Legal blindness, as defined in USA: Secondary | ICD-10-CM | POA: Diagnosis not present

## 2014-03-09 DIAGNOSIS — E785 Hyperlipidemia, unspecified: Secondary | ICD-10-CM

## 2014-03-09 DIAGNOSIS — I482 Chronic atrial fibrillation, unspecified: Secondary | ICD-10-CM

## 2014-03-09 DIAGNOSIS — I10 Essential (primary) hypertension: Secondary | ICD-10-CM | POA: Diagnosis not present

## 2014-03-09 DIAGNOSIS — E1140 Type 2 diabetes mellitus with diabetic neuropathy, unspecified: Secondary | ICD-10-CM

## 2014-03-09 DIAGNOSIS — F0390 Unspecified dementia without behavioral disturbance: Secondary | ICD-10-CM | POA: Diagnosis not present

## 2014-03-09 DIAGNOSIS — I6990 Unspecified sequelae of unspecified cerebrovascular disease: Secondary | ICD-10-CM | POA: Diagnosis not present

## 2014-03-09 HISTORY — DX: Legal blindness, as defined in USA: H54.8

## 2014-03-09 NOTE — Progress Notes (Signed)
Patient ID: Casey [name], female   DOB: 12/16/1940, 74 y.o.   MRN: [id number]    HISTORY AND PHYSICAL  Location:  [facility]    Place of Service: SNF (31)   Extended Emergency Contact Information Primary Emergency Contact: Armstrong,Pat  [place] Home Phone: [phone] Relation: Daughter Secondary Emergency Contact: Whitset-Mims,Cathy          [place], [place] Home Phone: [phone] Mobile Phone: [phone] Relation: Niece  Advanced Directive information  FULL CODE  Chief Complaint  Patient presents with  . New Admit To SNF    HTN, hyperlipidemia, DM II, hx CVA, afib, dementia with behavioral disturbance, legally blind, urinary incontinence    HPI:  74 yo female seen today as a new admission into SNF for above. She reports no concerns. There are no nursing issues. She will be a long term resident. She is legally blind. Unable to obtain further ROS due to pt's mental status  Past Medical History  Diagnosis Date  . Chronic atrial fibrillation   . Stroke   . Hypertension   . Hypercholesterolemia   . Diabetes mellitus   . Sinus of Valsalva aneurysm     a. By 2D echo 05/2011.  . 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  . CHF (congestive heart failure)   . GERD (gastroesophageal reflux disease)   . Urinary frequency   . Hypopotassemia   . Long term (current) use of anticoagulants   . Unspecified disorder of kidney and ureter   . Unspecified hereditary and idiopathic peripheral neuropathy   . Unspecified late effects of cerebrovascular disease   . Edema   . Peripheral vascular disease, unspecified   . Unspecified urinary incontinence   . Congestive heart failure, unspecified   . Gout, unspecified   . Abdominal or pelvic swelling, mass, or lump, left upper quadrant   . Vascular  dementia, uncomplicated   . Electrolyte and fluid disorders not elsewhere classified   . Hypopotassemia   . Long term (current) use of anticoagulants   . Chronic kidney disease   . Unspecified hereditary and idiopathic peripheral neuropathy   . Unspecified late effects of cerebrovascular disease   . Coronary atherosclerosis of unspecified type of vessel, native or graft   . Peripheral vascular disease, unspecified   . Gout, unspecified   . Seizures     Past Surgical History  Procedure Laterality Date  . Femoral to femoral bypass graft     . Femoral artery exploration  12/06/2011    Procedure: FEMORAL ARTERY EXPLORATION;  Surgeon: Christopher S Dickson, MD;  Location: [facility] OR;  Service: Vascular;  Laterality: N/A;  Exploration of large pseudoaneurysm left side of fem-fem bypass graft   . Femoral-femoral bypass graft  12/06/2011    Procedure: BYPASS GRAFT FEMORAL-FEMORAL ARTERY;  Surgeon: Christopher S Dickson, MD;  Location: [facility] OR;  Service: Vascular;  Laterality: Bilateral;  Revision of left to right Femoral-Femoral bypass graft  . False aneurysm repair  12/06/2011    Procedure: REPAIR FALSE ANEURYSM;  Surgeon: Christopher S Dickson, MD;  Location: [facility] OR;  Service: Vascular;  Laterality: Left;  Repair of left femoral Artery pseudoaneurysm  . Femoral-popliteal bypass graft Left 05/2002    lower extremity femoral to below knee w/ non-versed greater saphenous vein  . Femoral-popliteal bypass graft Left 11/2002  . Tubal ligation Bilateral   .   Aortogram  09/01/2008    Abd w/ bilateral lower extremity runoff arteriography    Patient Care Team: Mahima Pandey, MD as PCP - General (Internal Medicine)  History   Social History  . Marital Status: Widowed    Spouse Name: N/A  . Number of Children: N/A  . Years of Education: N/A   Occupational History  . Not on file.   Social History Main Topics  . Smoking status: Former Smoker    Types: Cigarettes    Quit date: 01/29/2001  . Smokeless  tobacco: Never Used  . Alcohol Use: No  . Drug Use: No  . Sexual Activity: No   Other Topics Concern  . Not on file   Social History Narrative     reports that she quit smoking about 13 years ago. Her smoking use included Cigarettes. She has never used smokeless tobacco. She reports that she does not drink alcohol or use illicit drugs.  Family History  Problem Relation Age of Onset  . Cancer Mother     Cervical  . Stroke Mother   . Diabetes Mother   . Stroke Father   . Heart disease Father    Family Status  Relation Status Death Age  . Mother Deceased 84    Cause of Death: Complications of diabetes  . Father Deceased     Cause of Death: Heart disease  . Brother Alive   . Daughter Alive   . Son Alive   . Sister Deceased   . Brother Deceased   . Brother Deceased     Immunization History  Administered Date(s) Administered  . Influenza Split 12/09/2011  . Pneumococcal Polysaccharide-23 02/05/2013   Past medical, surgical, family and social history reviewed   Allergies  Allergen Reactions  . Penicillins Other (See Comments)    Patient states that she was told previously by a doctor to not take this medication but is unsure why.     Medications: Patient's Medications  New Prescriptions   No medications on file  Previous Medications   ALLOPURINOL (ZYLOPRIM) 100 MG TABLET    Take 100 mg by mouth daily.   AMLODIPINE (NORVASC) 5 MG TABLET    Take 5 mg by mouth daily.   ASPIRIN 81 MG CHEWABLE TABLET    Chew 1 tablet (81 mg total) by mouth daily.   DIGOXIN (LANOXIN) 0.125 MG TABLET    Take 0.125 mg by mouth daily.   DONEPEZIL (ARICEPT) 10 MG TABLET    Take 1 tablet (10 mg total) by mouth at bedtime.   ENALAPRIL (VASOTEC) 2.5 MG TABLET    Take 1 tablet (2.5 mg total) by mouth daily.   FUROSEMIDE (LASIX) 20 MG TABLET    Take 20 mg by mouth daily.   GLUCOSE BLOOD (TRUETEST TEST) TEST STRIP    Test blood sugar twice daily to control blood sugar. 250.00   INSULIN  GLARGINE (LANTUS) 100 UNIT/ML INJECTION    Inject 12 Units into the skin at bedtime.   INSULIN SYRINGE-NEEDLE U-100 (BD INSULIN SYRINGE ULTRAFINE) 31G X 5/16" 0.3 ML MISC    USE TO GIVE NOVOLOG AND LANTUS INSULIN 4 TIMES A DAY.   LEVETIRACETAM (KEPPRA) 250 MG TABLET    Take 250 mg by mouth 2 (two) times daily.   METFORMIN (GLUCOPHAGE) 500 MG TABLET    Take 500 mg by mouth 2 (two) times daily with a meal.   METOPROLOL SUCCINATE (TOPROL-XL) 50 MG 24 HR TABLET    Take 50 mg by mouth   2 (two) times daily. Take with or immediately following a meal.   OXYBUTYNIN (DITROPAN) 5 MG TABLET    Take 1 tablet (5 mg total) by mouth 2 (two) times daily.   POTASSIUM CHLORIDE (K-DUR,KLOR-CON) 10 MEQ TABLET    Take 10 mEq by mouth daily.   SIMVASTATIN (ZOCOR) 20 MG TABLET    Take 1 tablet (20 mg total) by mouth daily.   TRAMADOL (ULTRAM) 50 MG TABLET    Take 1 tablet (50 mg total) by mouth 3 (three) times daily as needed for moderate pain.   WARFARIN (COUMADIN) 5 MG TABLET    Take 2.5-5 mg by mouth daily. Take 1/2 tablet (2.5 mg) on Tuesday, Thursday and Saturday, take 1 tablet (5 mg) on Sunday, Monday, Wednesday, Friday.  Modified Medications   No medications on file  Discontinued Medications   No medications on file    Review of Systems  Unable to obtain due to pt's mental status  Filed Vitals:   03/08/14 1539  BP: 128/81  Pulse: 60  Weight: 118 lb (53.524 kg)   Body mass index is 19.64 kg/(m^2).  Physical Exam CONSTITUTIONAL: Looks well in NAD. Awake and alert HEENT: PERRLA. Oropharynx clear and without exudate NECK: Supple. Nontender. No palpable cervical or supraclavicular lymph nodes. No carotid bruit b/l. No thyromegaly or thyroid mass palpable.  CVS: Regular rate without murmur, gallop or rub. LUNGS: CTA b/l no wheezing, rales or rhonchi. ABDOMEN: Bowel sounds present x 4. Soft, nontender, nondistended. No palpable mass or bruit EXTREMITIES: No edema b/l. Distal pulses palpable. No calf  tenderness. (+) hammertoes PSYCH: Affect, behavior and mood normal   Labs reviewed: No visits with results within 3 Month(s) from this visit. Latest known visit with results is:  Admission on 02/02/2013, Discharged on 02/07/2013  Component Date Value Ref Range Status  . Alcohol, Ethyl (B) 02/02/2013 <11  0 - 11 mg/dL Final   Comment:                                 LOWEST DETECTABLE LIMIT FOR                          SERUM ALCOHOL IS 11 mg/dL                          FOR MEDICAL PURPOSES ONLY  . Prothrombin Time 02/02/2013 12.7  11.6 - 15.2 seconds Final  . INR 02/02/2013 0.97  0.00 - 1.49 Final  . aPTT 02/02/2013 28  24 - 37 seconds Final  . WBC 02/02/2013 8.8  4.0 - 10.5 K/uL Final  . RBC 02/02/2013 4.83  3.87 - 5.11 MIL/uL Final  . Hemoglobin 02/02/2013 14.1  12.0 - 15.0 g/dL Final  . HCT 02/02/2013 42.1  36.0 - 46.0 % Final  . MCV 02/02/2013 87.2  78.0 - 100.0 fL Final  . MCH 02/02/2013 29.2  26.0 - 34.0 pg Final  . MCHC 02/02/2013 33.5  30.0 - 36.0 g/dL Final  . RDW 02/02/2013 15.2  11.5 - 15.5 % Final  . Platelets 02/02/2013 184  150 - 400 K/uL Final  . Neutrophils Relative % 02/02/2013 70  43 - 77 % Final  . Neutro Abs 02/02/2013 6.1  1.7 - 7.7 K/uL Final  . Lymphocytes Relative 02/02/2013 20  12 - 46 % Final  . Lymphs Abs 02/02/2013 1.8    0.7 - 4.0 K/uL Final  . Monocytes Relative 02/02/2013 7  3 - 12 % Final  . Monocytes Absolute 02/02/2013 0.6  0.1 - 1.0 K/uL Final  . Eosinophils Relative 02/02/2013 3  0 - 5 % Final  . Eosinophils Absolute 02/02/2013 0.3  0.0 - 0.7 K/uL Final  . Basophils Relative 02/02/2013 1  0 - 1 % Final  . Basophils Absolute 02/02/2013 0.1  0.0 - 0.1 K/uL Final  . Sodium 02/02/2013 139  137 - 147 mEq/L Final  . Potassium 02/02/2013 5.3  3.7 - 5.3 mEq/L Final   HEMOLYSIS AT THIS LEVEL MAY AFFECT RESULT  . Chloride 02/02/2013 102  96 - 112 mEq/L Final  . CO2 02/02/2013 26  19 - 32 mEq/L Final  . Glucose, Bld 02/02/2013 152* 70 - 99 mg/dL Final    . BUN 02/02/2013 26* 6 - 23 mg/dL Final  . Creatinine, Ser 02/02/2013 1.33* 0.50 - 1.10 mg/dL Final  . Calcium 02/02/2013 9.8  8.4 - 10.5 mg/dL Final  . Total Protein 02/02/2013 7.9  6.0 - 8.3 g/dL Final  . Albumin 02/02/2013 3.7  3.5 - 5.2 g/dL Final  . AST 02/02/2013 22  0 - 37 U/L Final   HEMOLYSIS AT THIS LEVEL MAY AFFECT RESULT  . ALT 02/02/2013 11  0 - 35 U/L Final   HEMOLYSIS AT THIS LEVEL MAY AFFECT RESULT  . Alkaline Phosphatase 02/02/2013 93  39 - 117 U/L Final  . Total Bilirubin 02/02/2013 0.4  0.3 - 1.2 mg/dL Final  . GFR calc non Af Amer 02/02/2013 39* >90 mL/min Final  . GFR calc Af Amer 02/02/2013 45* >90 mL/min Final   Comment: (NOTE)                          The eGFR has been calculated using the CKD EPI equation.                          This calculation has not been validated in all clinical situations.                          eGFR's persistently <90 mL/min signify possible Chronic Kidney                          Disease.  . Troponin I 02/02/2013 <0.30  <0.30 ng/mL Final   Comment:                                 Due to the release kinetics of cTnI,                          a negative result within the first hours                          of the onset of symptoms does not rule out                          myocardial infarction with certainty.                          If myocardial infarction is still suspected,                            repeat the test at appropriate intervals.  . Opiates 02/02/2013 NONE DETECTED  NONE DETECTED Final  . Cocaine 02/02/2013 NONE DETECTED  NONE DETECTED Final  . Benzodiazepines 02/02/2013 NONE DETECTED  NONE DETECTED Final  . Amphetamines 02/02/2013 NONE DETECTED  NONE DETECTED Final  . Tetrahydrocannabinol 02/02/2013 NONE DETECTED  NONE DETECTED Final  . Barbiturates 02/02/2013 NONE DETECTED  NONE DETECTED Final   Comment:                                 DRUG SCREEN FOR MEDICAL PURPOSES                          ONLY.  IF  CONFIRMATION IS NEEDED                          FOR ANY PURPOSE, NOTIFY LAB                          WITHIN 5 DAYS.                                                          LOWEST DETECTABLE LIMITS                          FOR URINE DRUG SCREEN                          Drug Class       Cutoff (ng/mL)                          Amphetamine      1000                          Barbiturate      200                          Benzodiazepine   200                          Tricyclics       300                          Opiates          300                          Cocaine          300                          THC              50  . Color, Urine 02/02/2013 YELLOW  YELLOW Final  . APPearance 02/02/2013 CLEAR  CLEAR Final  . Specific Gravity, Urine 02/02/2013 1.008  1.005 - 1.030 Final  . pH 02/02/2013 5.5  5.0 - 8.0 Final  . Glucose,   UA 02/02/2013 NEGATIVE  NEGATIVE mg/dL Final  . Hgb urine dipstick 02/02/2013 NEGATIVE  NEGATIVE Final  . Bilirubin Urine 02/02/2013 NEGATIVE  NEGATIVE Final  . Ketones, ur 02/02/2013 NEGATIVE  NEGATIVE mg/dL Final  . Protein, ur 02/02/2013 NEGATIVE  NEGATIVE mg/dL Final  . Urobilinogen, UA 02/02/2013 0.2  0.0 - 1.0 mg/dL Final  . Nitrite 02/02/2013 NEGATIVE  NEGATIVE Final  . Leukocytes, UA 02/02/2013 NEGATIVE  NEGATIVE Final   MICROSCOPIC NOT DONE ON URINES WITH NEGATIVE PROTEIN, BLOOD, LEUKOCYTES, NITRITE, OR GLUCOSE <1000 mg/dL.  Marland Kitchen Sodium 02/02/2013 140  137 - 147 mEq/L Final  . Potassium 02/02/2013 5.2  3.7 - 5.3 mEq/L Final  . Chloride 02/02/2013 106  96 - 112 mEq/L Final  . BUN 02/02/2013 27* 6 - 23 mg/dL Final  . Creatinine, Ser 02/02/2013 1.40* 0.50 - 1.10 mg/dL Final  . Glucose, Bld 02/02/2013 152* 70 - 99 mg/dL Final  . Calcium, Ion 02/02/2013 1.27  1.13 - 1.30 mmol/L Final  . TCO2 02/02/2013 25  0 - 100 mmol/L Final  . Hemoglobin 02/02/2013 15.6* 12.0 - 15.0 g/dL Final  . HCT 02/02/2013 46.0  36.0 - 46.0 % Final  . Troponin i, poc 02/02/2013 0.03  0.00  - 0.08 ng/mL Final  . Comment 3 02/02/2013          Final   Comment: Due to the release kinetics of cTnI,                          a negative result within the first hours                          of the onset of symptoms does not rule out                          myocardial infarction with certainty.                          If myocardial infarction is still suspected,                          repeat the test at appropriate intervals.  . Sed Rate 02/03/2013 3  0 - 22 mm/hr Final  . CRP 02/03/2013 <0.5* <0.60 mg/dL Final   Performed at [facility]  . Hgb A1c MFr Bld 02/03/2013 7.0* <5.7 % Final   Comment: (NOTE)                                                                                                                         According to the ADA Clinical Practice Recommendations for 2011, when                          HbA1c is used as a screening  test:                           >=6.5%   Diagnostic of Diabetes Mellitus                                    (if abnormal result is confirmed)                          5.7-6.4%   Increased risk of developing Diabetes Mellitus                          References:Diagnosis and Classification of Diabetes Mellitus,Diabetes                          YQMG,5003,70(WUGQB 1):S62-S69 and Standards of Medical Care in                                  Diabetes - 2011,Diabetes VQXI,5038,88 (Suppl 1):S11-S61.  . Mean Plasma Glucose 02/03/2013 154* <117 mg/dL Final   Performed at [facility]  . Cholesterol 02/03/2013 192  0 - 200 mg/dL Final  . Triglycerides 02/03/2013 156* <150 mg/dL Final  . HDL 02/03/2013 72  >39 mg/dL Final  . Total CHOL/HDL Ratio 02/03/2013 2.7   Final  . VLDL 02/03/2013 31  0 - 40 mg/dL Final  . LDL Cholesterol 02/03/2013 89  0 - 99 mg/dL Final   Comment:                                 Total Cholesterol/HDL:CHD Risk                          Coronary Heart Disease Risk Table                                               Men   Women                           1/2 Average Risk   3.4   3.3                           Average Risk       5.0   4.4                           2 X Average Risk   9.6   7.1                           3 X Average Risk  23.4   11.0  Use the calculated Patient Ratio                          above and the CHD Risk Table                          to determine the patient's CHD Risk.                                                          ATP III CLASSIFICATION (LDL):                           <100     mg/dL   Optimal                           100-129  mg/dL   Near or Above                                             Optimal                           130-159  mg/dL   Borderline                           160-189  mg/dL   High                           >190     mg/dL   Very High  . Prothrombin Time 02/03/2013 13.3  11.6 - 15.2 seconds Final  . INR 02/03/2013 1.03  0.00 - 1.49 Final  . Glucose-Capillary 02/03/2013 140* 70 - 99 mg/dL Final  . Glucose-Capillary 02/03/2013 128* 70 - 99 mg/dL Final  . Prothrombin Time 02/04/2013 13.8  11.6 - 15.2 seconds Final  . INR 02/04/2013 1.08  0.00 - 1.49 Final  . WBC 02/04/2013 6.2  4.0 - 10.5 K/uL Final  . RBC 02/04/2013 5.12* 3.87 - 5.11 MIL/uL Final  . Hemoglobin 02/04/2013 14.8  12.0 - 15.0 g/dL Final  . HCT 02/04/2013 43.4  36.0 - 46.0 % Final  . MCV 02/04/2013 84.8  78.0 - 100.0 fL Final  . MCH 02/04/2013 28.9  26.0 - 34.0 pg Final  . MCHC 02/04/2013 34.1  30.0 - 36.0 g/dL Final  . RDW 02/04/2013 14.9  11.5 - 15.5 % Final  . Platelets 02/04/2013 198  150 - 400 K/uL Final  . Sodium 02/04/2013 138  137 - 147 mEq/L Final  . Potassium 02/04/2013 4.1  3.7 - 5.3 mEq/L Final  . Chloride 02/04/2013 101  96 - 112 mEq/L Final  . CO2 02/04/2013 21  19 - 32 mEq/L Final  . Glucose, Bld 02/04/2013 152* 70 - 99 mg/dL Final  . BUN 02/04/2013 17  6 - 23 mg/dL Final  . Creatinine, Ser 02/04/2013  1.00  0.50 - 1.10 mg/dL Final  . Calcium 02/04/2013 10.1  8.4 - 10.5 mg/dL Final  . GFR calc non Af   Amer 02/04/2013 55* >90 mL/min Final  . GFR calc Af Amer 02/04/2013 64* >90 mL/min Final   Comment: (NOTE)                          The eGFR has been calculated using the CKD EPI equation.                          This calculation has not been validated in all clinical situations.                          eGFR's persistently <90 mL/min signify possible Chronic Kidney                          Disease.  . Glucose-Capillary 02/03/2013 122* 70 - 99 mg/dL Final  . Glucose-Capillary 02/03/2013 187* 70 - 99 mg/dL Final  . Glucose-Capillary 02/04/2013 147* 70 - 99 mg/dL Final  . Glucose-Capillary 02/04/2013 118* 70 - 99 mg/dL Final  . Glucose-Capillary 02/04/2013 188* 70 - 99 mg/dL Final  . Prothrombin Time 02/05/2013 15.5* 11.6 - 15.2 seconds Final  . INR 02/05/2013 1.26  0.00 - 1.49 Final  . Glucose-Capillary 02/04/2013 143* 70 - 99 mg/dL Final  . Comment 1 02/04/2013 Notify RN   Final  . Glucose-Capillary 02/05/2013 135* 70 - 99 mg/dL Final  . Glucose-Capillary 02/05/2013 140* 70 - 99 mg/dL Final  . Prothrombin Time 02/06/2013 16.6* 11.6 - 15.2 seconds Final  . INR 02/06/2013 1.38  0.00 - 1.49 Final  . Glucose-Capillary 02/05/2013 163* 70 - 99 mg/dL Final  . Glucose-Capillary 02/05/2013 134* 70 - 99 mg/dL Final  . Comment 1 02/05/2013 Notify RN   Final  . Glucose-Capillary 02/06/2013 139* 70 - 99 mg/dL Final  . Glucose-Capillary 02/06/2013 147* 70 - 99 mg/dL Final  . Glucose-Capillary 02/06/2013 137* 70 - 99 mg/dL Final  . Prothrombin Time 02/07/2013 18.5* 11.6 - 15.2 seconds Final  . INR 02/07/2013 1.59* 0.00 - 1.49 Final  . Glucose-Capillary 02/06/2013 224* 70 - 99 mg/dL Final  . Comment 1 02/06/2013 Notify RN   Final  . Glucose-Capillary 02/07/2013 162* 70 - 99 mg/dL Final  . Comment 1 02/07/2013 Documented in Chart   Final  . Comment 2 02/07/2013 Notify RN   Final  . Glucose-Capillary  02/07/2013 149* 70 - 99 mg/dL Final  . Comment 1 02/07/2013 Documented in Chart   Final  . Comment 2 02/07/2013 Notify RN   Final    No results found.  Chart reviewed   Assessment/Plan   ICD-9-CM ICD-10-CM   1. Dementia without behavioral disturbance 294.20 F03.90   2. Type 2 diabetes mellitus with diabetic neuropathy 250.60 E11.40    357.2    3. Essential hypertension 401.9 I10   4. Chronic atrial fibrillation 427.31 I48.2   5. Late effects of CVA (cerebrovascular accident) 438.9 I69.90   6. Urinary incontinence, unspecified incontinence type 788.30 R32   7. Hyperlipidemia 272.4 E78.5   8. Legally blind 369.4 H54.8    --PT/OT/ST as indicated  --continue current medications as ordered  --check CBGs daily  --will need to check fasting CMP, CBC w diff, A1c and lipid panel  --will follow  GOAL: short term rehab and transition to long term care. Communicated with pt and nursing.   S. , D. O., F. A. C. O. I.  [facility] [address] , [place] [address] [phone] Office (Wednesdays and Fridays 8 AM - 5 PM) [phone] Cell (Monday-Friday 8 AM - 5 PM)    

## 2014-03-23 ENCOUNTER — Encounter: Payer: Self-pay | Admitting: Internal Medicine

## 2014-04-15 ENCOUNTER — Non-Acute Institutional Stay (SKILLED_NURSING_FACILITY): Payer: Medicare Other | Admitting: Adult Health

## 2014-04-15 DIAGNOSIS — R32 Unspecified urinary incontinence: Secondary | ICD-10-CM

## 2014-04-15 DIAGNOSIS — I482 Chronic atrial fibrillation, unspecified: Secondary | ICD-10-CM

## 2014-04-15 DIAGNOSIS — E7800 Pure hypercholesterolemia, unspecified: Secondary | ICD-10-CM

## 2014-04-15 DIAGNOSIS — F0390 Unspecified dementia without behavioral disturbance: Secondary | ICD-10-CM | POA: Diagnosis not present

## 2014-04-15 DIAGNOSIS — I5022 Chronic systolic (congestive) heart failure: Secondary | ICD-10-CM

## 2014-04-15 DIAGNOSIS — R569 Unspecified convulsions: Secondary | ICD-10-CM | POA: Diagnosis not present

## 2014-04-15 DIAGNOSIS — I10 Essential (primary) hypertension: Secondary | ICD-10-CM | POA: Diagnosis not present

## 2014-04-15 DIAGNOSIS — E1140 Type 2 diabetes mellitus with diabetic neuropathy, unspecified: Secondary | ICD-10-CM

## 2014-04-15 DIAGNOSIS — M109 Gout, unspecified: Secondary | ICD-10-CM

## 2014-05-15 ENCOUNTER — Encounter: Payer: Self-pay | Admitting: Adult Health

## 2014-05-15 NOTE — Progress Notes (Signed)
Patient ID: Casey [name], female   DOB: 10/09/1940, 74 y.o.   MRN: [id number]  [facility]     Allergies  Allergen Reactions  . Penicillins Other (See Comments)    Patient states that she was told previously by a doctor to not take this medication but is unsure why.        Chief Complaint  Patient presents with  . Medical Management of Chronic Issues    HPI:  She is a resident of this facility being seen for the management of her chronic illnesses. Overall she remains stable. She is unable to fully participate in the hpi or ros. There are no nursing concerns being voiced at this time.    Past Medical History  Diagnosis Date  . Chronic atrial fibrillation   . Stroke   . Hypertension   . Hypercholesterolemia   . Diabetes mellitus   . Sinus of Valsalva aneurysm     a. By 2D echo 05/2011.  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  . CHF (congestive heart failure)   . GERD (gastroesophageal reflux disease)   . Urinary frequency   . Hypopotassemia   . Long term (current) use of anticoagulants   . Unspecified disorder of kidney and ureter   . Unspecified hereditary and idiopathic peripheral neuropathy   . Unspecified late effects of cerebrovascular disease   . Edema   . Peripheral vascular disease, unspecified   . Unspecified urinary incontinence   . Congestive heart failure, unspecified   . Gout, unspecified   . Abdominal or pelvic swelling, mass, or lump, left upper quadrant   . Vascular dementia, uncomplicated   . Electrolyte and fluid disorders not elsewhere classified   . Hypopotassemia   . Long term (current) use of anticoagulants   . Chronic kidney disease   . Unspecified hereditary and idiopathic peripheral neuropathy   . Unspecified late effects of cerebrovascular disease   . Coronary atherosclerosis of unspecified type of vessel, native  or graft   . Peripheral vascular disease, unspecified   . Gout, unspecified   . Seizures     Past Surgical History  Procedure Laterality Date  . Femoral to femoral bypass graft     . Femoral artery exploration  12/06/2011    Procedure: FEMORAL ARTERY EXPLORATION;  Surgeon: Angelia Mould, MD;  Location: [facility] OR;  Service: Vascular;  Laterality: N/A;  Exploration of large pseudoaneurysm left side of fem-fem bypass graft   . Femoral-femoral bypass graft  12/06/2011    Procedure: BYPASS GRAFT FEMORAL-FEMORAL ARTERY;  Surgeon: Angelia Mould, MD;  Location: [facility] OR;  Service: Vascular;  Laterality: Bilateral;  Revision of left to right Femoral-Femoral bypass graft  . False aneurysm repair  12/06/2011    Procedure: REPAIR FALSE ANEURYSM;  Surgeon: Angelia Mould, MD;  Location: [place];  Service: Vascular;  Laterality: Left;  Repair of left femoral Artery pseudoaneurysm  . Femoral-popliteal bypass graft Left 05/2002    lower extremity femoral to below knee w/ non-versed greater saphenous vein  . Femoral-popliteal bypass graft Left 11/2002  . Tubal ligation Bilateral   . Aortogram  09/01/2008    Abd w/ bilateral lower extremity runoff arteriography    VITAL SIGNS BP 110/62 mmHg  Pulse 72  Ht 5\' 6"  (1.676 m)  Wt 121 lb (54.885 kg)  BMI 19.54 kg/m2  SpO2 98%   Outpatient  Encounter Prescriptions as of 04/15/2014  Medication Sig  . allopurinol (ZYLOPRIM) 100 MG tablet Take 100 mg by mouth daily.  Marland Kitchen amLODipine (NORVASC) 5 MG tablet Take 5 mg by mouth daily.  Marland Kitchen atorvastatin (LIPITOR) 10 MG tablet Take 10 mg by mouth daily at 6 PM.  . digoxin (LANOXIN) 0.125 MG tablet Take 0.125 mg by mouth daily.  Marland Kitchen donepezil (ARICEPT) 10 MG tablet Take 1 tablet (10 mg total) by mouth at bedtime.  . enalapril (VASOTEC) 2.5 MG tablet Take 1 tablet (2.5 mg total) by mouth daily.  . furosemide (LASIX) 20 MG tablet Take 20 mg by mouth daily.  Marland Kitchen glucose blood (TRUETEST TEST) test strip Test blood  sugar twice daily to control blood sugar. 250.00  . insulin glargine (LANTUS) 100 UNIT/ML injection Inject 12 Units into the skin at bedtime.  . Insulin Syringe-Needle U-100 (BD INSULIN SYRINGE ULTRAFINE) 31G X 5/16" 0.3 ML MISC USE TO GIVE NOVOLOG AND LANTUS INSULIN 4 TIMES A DAY.  . levETIRAcetam (KEPPRA) 250 MG tablet Take 250 mg by mouth 2 (two) times daily.  . metFORMIN (GLUCOPHAGE) 500 MG tablet Take 500 mg by mouth 2 (two) times daily with a meal.  . metoprolol succinate (TOPROL-XL) 50 MG 24 hr tablet Take 50 mg by mouth 2 (two) times daily. Take with or immediately following a meal.  . oxybutynin (DITROPAN) 5 MG tablet Take 1 tablet (5 mg total) by mouth 2 (two) times daily.  . potassium chloride (K-DUR,KLOR-CON) 10 MEQ tablet Take 10 mEq by mouth daily.  . sertraline (ZOLOFT) 50 MG tablet Take 50 mg by mouth daily.  . traMADol (ULTRAM) 50 MG tablet Take 1 tablet (50 mg total) by mouth 3 (three) times daily as needed for moderate pain.     SIGNIFICANT DIAGNOSTIC EXAMS  LABS REVIEWED:  None recent    Review of Systems  Unable to perform ROS    Physical Exam  Constitutional: No distress.  Thin   Neck: Neck supple. No JVD present. No thyromegaly present.  Cardiovascular: Normal rate, regular rhythm and intact distal pulses.   Respiratory: Effort normal and breath sounds normal. No respiratory distress.  GI: Soft. Bowel sounds are normal. She exhibits no distension. There is no tenderness.  Musculoskeletal: She exhibits no edema.  Is able to move extremities   Neurological: She is alert.  Skin: Skin is warm and dry. She is not diaphoretic.       ASSESSMENT/ PLAN:  1. Gout no recent flares will continue allopurinol 100 mg daily   2. Hypertension: will continue norvasc 5 mg daily; vasotec 2.5 mg daily toprol xl 50 mg twice daily will monitor   3. Afib: heart rate regular will continue digoxin 125 mcg daily; toprol xl 50 mg twice daily for rate control.   4. Dementia:  is without change will continue aricept 10 mg daily; will monitor  5. Diabetes: will continue lantus 12 units nightly and metformin 500 mg twice daily   6. Chronic systolic heart failure: is stable will continue lasix 20 mg daily with k+ 10 meq daily   7. Dyslipidemia: will continue lipitor 10 mg daily   8. Seizures: no reports of recent activity present; will continue keppra 250 mg twice daily  9. UI: will continue ditropan 5 mg twice daily   10. Depression: will continue zoloft 50 mg daily     Ok Edwards NP [facility]  Contact [phone] Monday through Friday 8am- 5pm  After hours call [phone]

## 2014-05-18 ENCOUNTER — Non-Acute Institutional Stay (SKILLED_NURSING_FACILITY): Payer: Medicare Other | Admitting: Adult Health

## 2014-05-18 ENCOUNTER — Other Ambulatory Visit: Payer: Self-pay | Admitting: Internal Medicine

## 2014-05-18 ENCOUNTER — Encounter: Payer: Self-pay | Admitting: Adult Health

## 2014-05-18 DIAGNOSIS — I482 Chronic atrial fibrillation, unspecified: Secondary | ICD-10-CM

## 2014-05-18 DIAGNOSIS — R911 Solitary pulmonary nodule: Secondary | ICD-10-CM | POA: Diagnosis not present

## 2014-05-18 DIAGNOSIS — I5022 Chronic systolic (congestive) heart failure: Secondary | ICD-10-CM

## 2014-05-18 DIAGNOSIS — F0390 Unspecified dementia without behavioral disturbance: Secondary | ICD-10-CM

## 2014-05-18 DIAGNOSIS — R32 Unspecified urinary incontinence: Secondary | ICD-10-CM

## 2014-05-18 DIAGNOSIS — E1140 Type 2 diabetes mellitus with diabetic neuropathy, unspecified: Secondary | ICD-10-CM | POA: Diagnosis not present

## 2014-05-18 DIAGNOSIS — R569 Unspecified convulsions: Secondary | ICD-10-CM | POA: Diagnosis not present

## 2014-05-18 DIAGNOSIS — I10 Essential (primary) hypertension: Secondary | ICD-10-CM | POA: Diagnosis not present

## 2014-05-18 DIAGNOSIS — I6990 Unspecified sequelae of unspecified cerebrovascular disease: Secondary | ICD-10-CM | POA: Diagnosis not present

## 2014-05-18 DIAGNOSIS — M109 Gout, unspecified: Secondary | ICD-10-CM

## 2014-05-18 NOTE — Progress Notes (Signed)
Patient ID: Casey [name], female   DOB: Apr 18, 1940, 73 y.o.   MRN: [id number]  [facility]     Allergies  Allergen Reactions  . Penicillins Other (See Comments)    Patient states that she was told previously by a doctor to not take this medication but is unsure why.        Chief Complaint  Patient presents with  . Medical Management of Chronic Issues    HPI:  She is a long term resident of this facility being seen for the management of her chronic illnesses. Overall her status is without change. She cannot fully participate with hpi or ros but states that she is feeling good today. There are no nursing concerns today. She has a history of a pulmonary nodule on her right upper love found in Jan 2015; I cannot find a follow up ct scan of this nodule; will need to obtain one.    Past Medical History  Diagnosis Date  . Chronic atrial fibrillation   . Stroke   . Hypertension   . Hypercholesterolemia   . Diabetes mellitus   . Sinus of Valsalva aneurysm     a. By 2D echo 05/2011.  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  . CHF (congestive heart failure)   . GERD (gastroesophageal reflux disease)   . Urinary frequency   . Hypopotassemia   . Long term (current) use of anticoagulants   . Unspecified disorder of kidney and ureter   . Unspecified hereditary and idiopathic peripheral neuropathy   . Unspecified late effects of cerebrovascular disease   . Edema   . Peripheral vascular disease, unspecified   . Unspecified urinary incontinence   . Congestive heart failure, unspecified   . Gout, unspecified   . Abdominal or pelvic swelling, mass, or lump, left upper quadrant   . Vascular dementia, uncomplicated   . Electrolyte and fluid disorders not elsewhere classified   . Hypopotassemia   . Long term (current) use of anticoagulants   . Chronic kidney  disease   . Unspecified hereditary and idiopathic peripheral neuropathy   . Unspecified late effects of cerebrovascular disease   . Coronary atherosclerosis of unspecified type of vessel, native or graft   . Peripheral vascular disease, unspecified   . Gout, unspecified   . Seizures     Past Surgical History  Procedure Laterality Date  . Femoral to femoral bypass graft     . Femoral artery exploration  12/06/2011    Procedure: FEMORAL ARTERY EXPLORATION;  Surgeon: Angelia Mould, MD;  Location: [facility] OR;  Service: Vascular;  Laterality: N/A;  Exploration of large pseudoaneurysm left side of fem-fem bypass graft   . Femoral-femoral bypass graft  12/06/2011    Procedure: BYPASS GRAFT FEMORAL-FEMORAL ARTERY;  Surgeon: Angelia Mould, MD;  Location: [facility] OR;  Service: Vascular;  Laterality: Bilateral;  Revision of left to right Femoral-Femoral bypass graft  . False aneurysm repair  12/06/2011    Procedure: REPAIR FALSE ANEURYSM;  Surgeon: Angelia Mould, MD;  Location: [place];  Service: Vascular;  Laterality: Left;  Repair of left femoral Artery pseudoaneurysm  . Femoral-popliteal bypass graft Left 05/2002    lower extremity femoral to below knee w/ non-versed greater saphenous vein  . Femoral-popliteal bypass graft Left 11/2002  . Tubal ligation Bilateral   . Aortogram  09/01/2008    Abd w/  bilateral lower extremity runoff arteriography    VITAL SIGNS BP 151/95 mmHg  Pulse 65  Ht 5\' 6"  (1.676 m)  Wt 121 lb (54.885 kg)  BMI 19.54 kg/m2  SpO2 97%   Outpatient Encounter Prescriptions as of 05/18/2014  Medication Sig  . allopurinol (ZYLOPRIM) 100 MG tablet Take 100 mg by mouth daily.  Marland Kitchen amLODipine (NORVASC) 5 MG tablet Take 5 mg by mouth daily.  Marland Kitchen aspirin 81 MG tablet Take 81 mg by mouth daily.  Marland Kitchen atorvastatin (LIPITOR) 10 MG tablet Take 10 mg by mouth daily at 6 PM.  . digoxin (LANOXIN) 0.125 MG tablet Take 0.125 mg by mouth daily.  Marland Kitchen donepezil (ARICEPT) 10 MG tablet Take  1 tablet (10 mg total) by mouth at bedtime.  . enalapril (VASOTEC) 2.5 MG tablet Take 1 tablet (2.5 mg total) by mouth daily.  . furosemide (LASIX) 20 MG tablet Take 20 mg by mouth daily.  . insulin glargine (LANTUS) 100 UNIT/ML injection Inject 12 Units into the skin at bedtime.  . levETIRAcetam (KEPPRA) 250 MG tablet Take 250 mg by mouth 2 (two) times daily.  . metFORMIN (GLUCOPHAGE) 500 MG tablet Take 500 mg by mouth 2 (two) times daily with a meal.  . metoprolol succinate (TOPROL-XL) 50 MG 24 hr tablet Take 50 mg by mouth 2 (two) times daily. Take with or immediately following a meal.  . oxybutynin (DITROPAN) 5 MG tablet Take 1 tablet (5 mg total) by mouth 2 (two) times daily.  . potassium chloride (K-DUR,KLOR-CON) 10 MEQ tablet Take 10 mEq by mouth daily.  . sertraline (ZOLOFT) 50 MG tablet Take 50 mg by mouth daily.  . traMADol (ULTRAM) 50 MG tablet Take 1 tablet (50 mg total) by mouth 3 (three) times daily as needed for moderate pain.     SIGNIFICANT DIAGNOSTIC EXAMS   LABS REVIEWED:  None recent    ROS Unable to perform ROS   Physical Exam Constitutional: No distress.  Thin   Neck: Neck supple. No JVD present. No thyromegaly present.  Cardiovascular: Normal rate, regular rhythm and intact distal pulses.   Respiratory: Effort normal and breath sounds normal. No respiratory distress.  GI: Soft. Bowel sounds are normal. She exhibits no distension. There is no tenderness.  Musculoskeletal: She exhibits no edema.  Is able to move left extremities Has right hemiparesis    Neurological: She is alert.  Skin: Skin is warm and dry. She is not diaphoretic.     ASSESSMENT/ PLAN:  1. Gout no recent flares will continue allopurinol 100 mg daily   2. Hypertension: will continue  vasotec 2.5 mg daily toprol xl 50 mg twice daily will increase her norvasc to 10 mg daily and will have nursing check blood pressure twice daily   3. Afib: heart rate regular will continue  Asa 81 mg  daily with digoxin 125 mcg daily; toprol xl 50 mg twice daily for rate control.   4. Dementia: is without change will continue aricept 10 mg daily; will monitor  5. Diabetes: will continue lantus 12 units nightly and metformin 500 mg twice daily   6. Chronic systolic heart failure:  Ef is 35-40%; is stable will continue lasix 20 mg daily with k+ 10 meq daily   7. Dyslipidemia: will continue lipitor 10 mg daily   8. Seizures: no reports of recent activity present; will continue keppra 250 mg twice daily  9. UI: will continue ditropan 5 mg twice daily   10. Depression: will continue zoloft 50  mg daily    Will check cbc; cmp; lipids; hgb a1c; dig level next draw  Will setup ct scan of lungs with contrast to evaluate right upper lobe nodule     Ok Edwards NP [facility]  Contact [phone] Monday through Friday 8am- 5pm  After hours call [phone]

## 2014-05-19 LAB — CBC AND DIFFERENTIAL
HCT: 42 % (ref 36–46)
HEMOGLOBIN: 13.3 g/dL (ref 12.0–16.0)
Platelets: 210 10*3/uL (ref 150–399)
WBC: 6.9 10^3/mL

## 2014-05-19 LAB — HEPATIC FUNCTION PANEL
ALT: 22 U/L (ref 7–35)
AST: 20 U/L (ref 13–35)
Bilirubin, Total: 0.4 mg/dL

## 2014-05-19 LAB — BASIC METABOLIC PANEL
CREATININE: 1 mg/dL (ref <=1.1)
GLUCOSE: 129 mg/dL
Potassium: 4.4 mmol/L (ref 3.4–5.3)
SODIUM: 140 mmol/L (ref 137–147)

## 2014-05-19 LAB — LIPID PANEL
Cholesterol: 144 mg/dL (ref 0–200)
HDL: 52 mg/dL (ref 35–70)
LDL Cholesterol: 57 mg/dL
Triglycerides: 130 mg/dL (ref 40–160)

## 2014-05-19 LAB — HEMOGLOBIN A1C: HEMOGLOBIN A1C: 5.8 % (ref 4.0–6.0)

## 2014-05-21 ENCOUNTER — Ambulatory Visit
Admission: RE | Admit: 2014-05-21 | Discharge: 2014-05-21 | Disposition: A | Discharge status: Home / Self Care | Payer: Medicare Other | Source: Ambulatory Visit | Attending: Internal Medicine | Admitting: Internal Medicine

## 2014-05-21 ENCOUNTER — Other Ambulatory Visit: Payer: Self-pay | Admitting: Internal Medicine

## 2014-05-21 DIAGNOSIS — R911 Solitary pulmonary nodule: Secondary | ICD-10-CM

## 2014-06-01 ENCOUNTER — Encounter: Payer: Self-pay | Admitting: *Deleted

## 2014-06-30 ENCOUNTER — Non-Acute Institutional Stay (SKILLED_NURSING_FACILITY): Payer: Medicare Other | Admitting: Adult Health

## 2014-06-30 DIAGNOSIS — I5022 Chronic systolic (congestive) heart failure: Secondary | ICD-10-CM | POA: Diagnosis not present

## 2014-06-30 DIAGNOSIS — R569 Unspecified convulsions: Secondary | ICD-10-CM | POA: Diagnosis not present

## 2014-06-30 DIAGNOSIS — I482 Chronic atrial fibrillation, unspecified: Secondary | ICD-10-CM

## 2014-06-30 DIAGNOSIS — M109 Gout, unspecified: Secondary | ICD-10-CM | POA: Diagnosis not present

## 2014-06-30 DIAGNOSIS — F0390 Unspecified dementia without behavioral disturbance: Secondary | ICD-10-CM

## 2014-06-30 DIAGNOSIS — R32 Unspecified urinary incontinence: Secondary | ICD-10-CM

## 2014-06-30 DIAGNOSIS — I10 Essential (primary) hypertension: Secondary | ICD-10-CM

## 2014-06-30 DIAGNOSIS — R911 Solitary pulmonary nodule: Secondary | ICD-10-CM

## 2014-06-30 DIAGNOSIS — E1140 Type 2 diabetes mellitus with diabetic neuropathy, unspecified: Secondary | ICD-10-CM

## 2014-06-30 DIAGNOSIS — E785 Hyperlipidemia, unspecified: Secondary | ICD-10-CM

## 2014-07-07 ENCOUNTER — Encounter: Payer: Self-pay | Admitting: Adult Health

## 2014-07-07 NOTE — Progress Notes (Signed)
Patient ID: Casey [name], female   DOB: May 08, 1940, 74 y.o.   MRN: [id number]  [facility]     Allergies  Allergen Reactions  . Penicillins Other (See Comments)    Patient states that she was told previously by a doctor to not take this medication but is unsure why.        Chief Complaint  Patient presents with  . Medical Management of Chronic Issues    HPI:  She is a long term resident of this facility being seen for the management of her chronic illnesses. Overall there is little change in her status. She is unable to participate in the hpi or ros. There are no nursing concerns at this time.    Past Medical History  Diagnosis Date  . Chronic atrial fibrillation   . Stroke   . Hypertension   . Hypercholesterolemia   . Diabetes mellitus   . Sinus of Valsalva aneurysm     a. By 2D echo 05/2011.  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  . CHF (congestive heart failure)   . GERD (gastroesophageal reflux disease)   . Urinary frequency   . Hypopotassemia   . Long term (current) use of anticoagulants   . Unspecified disorder of kidney and ureter   . Unspecified hereditary and idiopathic peripheral neuropathy   . Unspecified late effects of cerebrovascular disease   . Edema   . Peripheral vascular disease, unspecified   . Unspecified urinary incontinence   . Congestive heart failure, unspecified   . Gout, unspecified   . Abdominal or pelvic swelling, mass, or lump, left upper quadrant   . Vascular dementia, uncomplicated   . Electrolyte and fluid disorders not elsewhere classified   . Hypopotassemia   . Long term (current) use of anticoagulants   . Chronic kidney disease   . Unspecified hereditary and idiopathic peripheral neuropathy   . Unspecified late effects of cerebrovascular disease   . Coronary atherosclerosis of unspecified type of  vessel, native or graft   . Peripheral vascular disease, unspecified   . Gout, unspecified   . Seizures     Past Surgical History  Procedure Laterality Date  . Femoral to femoral bypass graft     . Femoral artery exploration  12/06/2011    Procedure: FEMORAL ARTERY EXPLORATION;  Surgeon: Angelia Mould, MD;  Location: [facility] OR;  Service: Vascular;  Laterality: N/A;  Exploration of large pseudoaneurysm left side of fem-fem bypass graft   . Femoral-femoral bypass graft  12/06/2011    Procedure: BYPASS GRAFT FEMORAL-FEMORAL ARTERY;  Surgeon: Angelia Mould, MD;  Location: [facility] OR;  Service: Vascular;  Laterality: Bilateral;  Revision of left to right Femoral-Femoral bypass graft  . False aneurysm repair  12/06/2011    Procedure: REPAIR FALSE ANEURYSM;  Surgeon: Angelia Mould, MD;  Location: [place];  Service: Vascular;  Laterality: Left;  Repair of left femoral Artery pseudoaneurysm  . Femoral-popliteal bypass graft Left 05/2002    lower extremity femoral to below knee w/ non-versed greater saphenous vein  . Femoral-popliteal bypass graft Left 11/2002  . Tubal ligation Bilateral   . Aortogram  09/01/2008    Abd w/ bilateral lower extremity runoff arteriography    VITAL SIGNS BP 100/59 mmHg  Pulse 66  Ht 5\' 6"  (1.676 m)  Wt 122 lb (55.339 kg)  BMI 19.70 kg/m2  SpO2 98%  Outpatient Encounter Prescriptions as of 06/30/2014  Medication Sig  . allopurinol (ZYLOPRIM) 100 MG tablet Take 100 mg by mouth daily.  Marland Kitchen amLODipine (NORVASC) 5 MG tablet Take 5 mg by mouth daily.  Marland Kitchen aspirin 81 MG tablet Take 81 mg by mouth daily.  Marland Kitchen atorvastatin (LIPITOR) 10 MG tablet Take 10 mg by mouth daily at 6 PM.  . digoxin (LANOXIN) 0.125 MG tablet Take 0.125 mg by mouth daily.  Marland Kitchen donepezil (ARICEPT) 10 MG tablet Take 1 tablet (10 mg total) by mouth at bedtime.  . enalapril (VASOTEC) 2.5 MG tablet Take 1 tablet (2.5 mg total) by mouth daily.  . furosemide (LASIX) 20 MG tablet Take 20 mg by mouth  daily.  . insulin glargine (LANTUS) 100 UNIT/ML injection Inject 12 Units into the skin at bedtime.  . levETIRAcetam (KEPPRA) 250 MG tablet Take 250 mg by mouth 2 (two) times daily.  . metFORMIN (GLUCOPHAGE) 500 MG tablet Take 500 mg by mouth 2 (two) times daily with a meal.  . metoprolol (LOPRESSOR) 50 MG tablet Take 50 mg by mouth 2 (two) times daily.  Marland Kitchen omeprazole (PRILOSEC) 20 MG capsule Take 20 mg by mouth daily.  Marland Kitchen oxybutynin (DITROPAN) 5 MG tablet Take 1 tablet (5 mg total) by mouth 2 (two) times daily.  . potassium chloride (K-DUR,KLOR-CON) 10 MEQ tablet Take 10 mEq by mouth daily.  . sertraline (ZOLOFT) 50 MG tablet Take 50 mg by mouth daily.  . traMADol (ULTRAM) 50 MG tablet Take 1 tablet (50 mg total) by mouth 3 (three) times daily as needed for moderate pain.      SIGNIFICANT DIAGNOSTIC EXAMS  05-21-14: ct of chest: 1. Right upper lobe 5 mm nodule unchanged. 2. Additional nodules identified, some of which are noncalcified andvalso warrants follow-up. 3. Persistent mediastinal and hilar adenopathy. This may bevlong-standing given the previous reports of adenopathy. At the [facility] next exam, consider contrast-enhanced exam for further evaluation of the lymph nodes. 4. Given patient's history of smoking, follow-up chest CT at 6-12vmonths is recommended. 5. Coronary artery disease.  LABS REVIEWED:   05-19-14: wbc 6.9; hgb 13.3; hct 41.6; mcv 86.5; plt 210; glucose 129; bun 21; creat 1.04; k+4.4; na++140; liver normal albumin 4.0; chol 144; ldl 57; trig 130; hdl 52; hgb a1c 5.8; dig 1.5    ROS Unable to perform ROS    Physical Exam Constitutional: No distress.  Thin   Neck: Neck supple. No JVD present. No thyromegaly present.  Cardiovascular: Normal rate, regular rhythm and intact distal pulses.   Respiratory: Effort normal and breath sounds normal. No respiratory distress.  GI: Soft. Bowel sounds are normal. She exhibits no distension. There is no tenderness.    Musculoskeletal: She exhibits no edema.  Is able to move left extremities Has right hemiparesis    Neurological: She is alert.  Skin: Skin is warm and dry. She is not diaphoretic.     ASSESSMENT/ PLAN:  1. Gout no recent flares will continue allopurinol 100 mg daily   2. Hypertension: will continue  vasotec 2.5 mg daily lopressor 50 mg twice daily will continue norvasc  5 mg daily   3. Afib: heart rate regular will continue  Asa 81 mg daily with digoxin 125 mcg daily; lopressor 50 mg twice daily for rate control.   4. Dementia: is without change will continue aricept 10 mg daily; will monitor  5. Diabetes: will continue lantus 12 units nightly and metformin 500 mg twice daily hgb a1c is 5.8  6. Chronic systolic heart failure:  Ef is 35-40%; is stable will continue lasix 20 mg daily with k+ 10 meq daily   7. Dyslipidemia: will continue lipitor 10 mg daily ldl is 57  8. Seizures: no reports of recent activity present; will continue keppra 250 mg twice daily  9. UI: will continue ditropan 5 mg twice daily   10. Depression: will continue zoloft 50 mg daily   11. Pulmonary nodule: will continue ct scans every 6 months and will monitor her status.         Ok Edwards NP [facility]  Contact [phone] Monday through Friday 8am- 5pm  After hours call [phone]

## 2014-08-05 ENCOUNTER — Non-Acute Institutional Stay (SKILLED_NURSING_FACILITY): Payer: Medicare Other | Admitting: Internal Medicine

## 2014-08-05 DIAGNOSIS — I5022 Chronic systolic (congestive) heart failure: Secondary | ICD-10-CM | POA: Diagnosis not present

## 2014-08-05 DIAGNOSIS — R569 Unspecified convulsions: Secondary | ICD-10-CM | POA: Diagnosis not present

## 2014-08-05 DIAGNOSIS — I482 Chronic atrial fibrillation, unspecified: Secondary | ICD-10-CM

## 2014-08-05 DIAGNOSIS — I10 Essential (primary) hypertension: Secondary | ICD-10-CM

## 2014-08-05 DIAGNOSIS — M109 Gout, unspecified: Secondary | ICD-10-CM

## 2014-08-05 DIAGNOSIS — F0390 Unspecified dementia without behavioral disturbance: Secondary | ICD-10-CM | POA: Diagnosis not present

## 2014-08-05 DIAGNOSIS — E1140 Type 2 diabetes mellitus with diabetic neuropathy, unspecified: Secondary | ICD-10-CM

## 2014-08-05 DIAGNOSIS — Z794 Long term (current) use of insulin: Secondary | ICD-10-CM | POA: Diagnosis not present

## 2014-08-05 DIAGNOSIS — R911 Solitary pulmonary nodule: Secondary | ICD-10-CM | POA: Diagnosis not present

## 2014-08-11 NOTE — Progress Notes (Signed)
Patient ID: Casey [name], female   DOB: 10-03-1940, 74 y.o.   MRN: [id number]    DATE:08/05/14  Location:  [place] of Service: SNF [phone])   Extended Emergency Contact Information Primary Emergency Contact: Casey [name] of [place] Phone: [phone] Relation: Daughter Secondary Emergency Contact: Casey [name], [place] of [place] Phone: [phone] Mobile Phone: [phone] Relation: Niece  Advanced Directive information  FULL CODE Chief Complaint  Patient presents with  . Medical Management of Chronic Issues    HPI:  74 yo female long term resident seen today for f/u. She is a poor historian due to dementia. Hx obtained form chart. She has no concerns today. No nursing issus. No falls. Appetite ok and she sleeps well at night. She is legally blind  DM - CBGs 110-190s. No low BS reactions. She takes insulin and metformin.she takes ACEI and statin. She was seen by podiatry last month for diabetic foot care  HTN - BP stable on amlodipine, metoprolol, enalapril  Hx CVA/PAD - stable on ASA. She takes keppra for sz prophylaxis  Gout - stable on allopurinol. No gout attacks  CHF - stable on lasix with potassium supp and metoprolol  afib - rate controlled on digoxin and metoprolol  Mood/dementia - stable on aricept and zoloft  Pulmonary nodule - CT chest in 4/16 showed stable right pulmonary nodule with persistent hilar/mediastinal adenopathy. She has a hx lymphoma  She was exposed to scabies and is currently undergoing prophylactic tx  Past Medical History  Diagnosis Date  . Chronic atrial fibrillation ([place])   . Stroke ([place])   . Hypertension   . Hypercholesterolemia   . Diabetes mellitus   . Sinus of Valsalva aneurysm     a. By 2D echo 05/2011.  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  . CHF (congestive heart failure) ([place])   . GERD (gastroesophageal reflux disease)   . Urinary frequency   . Hypopotassemia   . Long term (current) use of anticoagulants   . Unspecified disorder of kidney and ureter   . Unspecified hereditary and idiopathic peripheral neuropathy   . Unspecified late effects of cerebrovascular disease   . Edema   . Peripheral vascular disease, unspecified ([place])   . Unspecified urinary incontinence   . Congestive heart failure, unspecified   . Gout, unspecified   . Abdominal or pelvic swelling, mass, or lump, left upper quadrant   . Vascular dementia, uncomplicated   . Electrolyte and fluid disorders not elsewhere classified   . Hypopotassemia   . Long term (current) use of anticoagulants   . Chronic kidney disease   . Unspecified hereditary and idiopathic peripheral neuropathy   . Unspecified late effects of cerebrovascular disease   . Coronary atherosclerosis of unspecified type of vessel, native or graft   . Peripheral vascular disease, unspecified ([place])   . Gout, unspecified   . Seizures [facility])     Past Surgical History  Procedure Laterality Date  . Femoral to femoral bypass graft     . Femoral artery exploration  12/06/2011    Procedure: FEMORAL ARTERY EXPLORATION;  Surgeon: Angelia Mould, MD;  Location: [facility] OR;  Service: Vascular;  Laterality: N/A;  Exploration of large pseudoaneurysm left side of fem-fem bypass graft   .  Femoral-femoral bypass graft  12/06/2011    Procedure: BYPASS GRAFT FEMORAL-FEMORAL ARTERY;  Surgeon: Angelia Mould, MD;  Location: [facility] OR;  Service: Vascular;  Laterality: Bilateral;  Revision of left to right Femoral-Femoral bypass graft  . False aneurysm repair  12/06/2011    Procedure: REPAIR FALSE ANEURYSM;  Surgeon: Angelia Mould, MD;  Location: [place];  Service: Vascular;  Laterality: Left;  Repair of left femoral Artery pseudoaneurysm  . Femoral-popliteal bypass  graft Left 05/2002    lower extremity femoral to below knee w/ non-versed greater saphenous vein  . Femoral-popliteal bypass graft Left 11/2002  . Tubal ligation Bilateral   . Aortogram  09/01/2008    Abd w/ bilateral lower extremity runoff arteriography    Patient Care Team: Casey Duos, MD as PCP - General (Internal Medicine) Casey Fee, NP as Nurse Practitioner (Geriatric Medicine) [place] ([place])  Social History   Social History  . Marital Status: Widowed    Spouse Name: N/A  . Number of Children: N/A  . Years of Education: N/A   Occupational History  . Not on file.   Social History Main Topics  . Smoking status: Former Smoker    Types: Cigarettes    Quit date: 01/29/2001  . Smokeless tobacco: Never Used  . Alcohol Use: No  . Drug Use: No  . Sexual Activity: No   Other Topics Concern  . Not on file   Social History Narrative     reports that she quit smoking about 14 years ago. Her smoking use included Cigarettes. She has never used smokeless tobacco. She reports that she does not drink alcohol or use illicit drugs.  Immunization History  Administered Date(s) Administered  . Influenza Split 12/09/2011  . Influenza-Unspecified 11/27/2013  . Pneumococcal Polysaccharide-23 02/05/2013    Allergies  Allergen Reactions  . Penicillins Other (See Comments)    Patient states that she was told previously by a doctor to not take this medication but is unsure why.     Medications: Patient's Medications  New Prescriptions   No medications on file  Previous Medications   ALLOPURINOL (ZYLOPRIM) 100 MG TABLET    Take 100 mg by mouth daily.   AMLODIPINE (NORVASC) 5 MG TABLET    Take 5 mg by mouth daily.   ASPIRIN 81 MG TABLET    Take 81 mg by mouth daily.   ATORVASTATIN (LIPITOR) 10 MG TABLET    Take 10 mg by mouth daily at 6 PM.   DIGOXIN (LANOXIN) 0.125 MG TABLET    Take 0.125 mg by mouth daily.   DONEPEZIL  (ARICEPT) 10 MG TABLET    Take 1 tablet (10 mg total) by mouth at bedtime.   ENALAPRIL (VASOTEC) 2.5 MG TABLET    Take 1 tablet (2.5 mg total) by mouth daily.   FUROSEMIDE (LASIX) 20 MG TABLET    Take 20 mg by mouth daily.   INSULIN GLARGINE (LANTUS) 100 UNIT/ML INJECTION    Inject 12 Units into the skin at bedtime.   LEVETIRACETAM (KEPPRA) 250 MG TABLET    Take 250 mg by mouth 2 (two) times daily.   METFORMIN (GLUCOPHAGE) 500 MG TABLET    Take 500 mg by mouth 2 (two) times daily with a meal.   METOPROLOL (LOPRESSOR) 50 MG TABLET    Take 50 mg by mouth 2 (two) times daily.   OMEPRAZOLE (PRILOSEC) 20 MG CAPSULE    Take 20 mg by mouth daily.  OXYBUTYNIN (DITROPAN) 5 MG TABLET    Take 1 tablet (5 mg total) by mouth 2 (two) times daily.   POTASSIUM CHLORIDE (K-DUR,KLOR-CON) 10 MEQ TABLET    Take 10 mEq by mouth daily.   SERTRALINE (ZOLOFT) 50 MG TABLET    Take 50 mg by mouth daily.   TRAMADOL (ULTRAM) 50 MG TABLET    Take 50 mg by mouth every 8 (eight) hours as needed.  Modified Medications   No medications on file  Discontinued Medications   TRAMADOL (ULTRAM) 50 MG TABLET    Take 1 tablet (50 mg total) by mouth 3 (three) times daily as needed for moderate pain.    Review of Systems  Unable to perform ROS: Dementia    Filed Vitals:   08/05/14 2151  BP: 128/68  Pulse: 64  Temp: 96.8 F (36 C)  SpO2: 98%   There is no weight on file to calculate BMI.  Physical Exam  Constitutional: She appears well-developed.  Frail appearing, lying in bed in NAD  HENT:  Mouth/Throat: Oropharynx is clear and moist. No oropharyngeal exudate.  Eyes: Pupils are equal, round, and reactive to light. No scleral icterus.  Neck: Neck supple. Carotid bruit is not present. No tracheal deviation present. No thyromegaly present.  Cardiovascular: Normal rate, regular rhythm and intact distal pulses.  Exam reveals no gallop and no friction rub.   Murmur (1/6 SEM) heard. No LE edema b/l. no calf TTP.     Pulmonary/Chest: Effort normal and breath sounds normal. No stridor. No respiratory distress. She has no wheezes. She has no rales.  Abdominal: Soft. Bowel sounds are normal. She exhibits no distension and no mass. There is no hepatomegaly. There is no tenderness. There is no rebound and no guarding.  Lymphadenopathy:       Head (right side): No preauricular and no posterior auricular adenopathy present.       Head (left side): No preauricular and no posterior auricular adenopathy present.    She has no cervical adenopathy.       Right: No supraclavicular adenopathy present.       Left: No supraclavicular adenopathy present.  Neurological: She is alert.  Skin: Skin is warm and dry. No rash noted.  Psychiatric: She has a normal mood and affect. Her behavior is normal.     Labs reviewed: Abstract on 06/01/2014  Component Date Value Ref Range Status  . Hemoglobin 05/19/2014 13.3  12.0 - 16.0 g/dL Final  . HCT 05/19/2014 42  36 - 46 % Final  . Platelets 05/19/2014 210  150 - 399 K/L Final  . WBC 05/19/2014 6.9   Final  . Glucose 05/19/2014 129   Final  . Creatinine 05/19/2014 1.0  .5 - 1.1 mg/dL Final  . Potassium 05/19/2014 4.4  3.4 - 5.3 mmol/L Final  . Sodium 05/19/2014 140  137 - 147 mmol/L Final  . Triglycerides 05/19/2014 130  40 - 160 mg/dL Final  . Cholesterol 05/19/2014 144  0 - 200 mg/dL Final  . HDL 05/19/2014 52  35 - 70 mg/dL Final  . LDL Cholesterol 05/19/2014 57   Final  . ALT 05/19/2014 22  7 - 35 U/L Final  . AST 05/19/2014 20  13 - 35 U/L Final  . Bilirubin, Total 05/19/2014 0.4   Final  . Hgb A1c MFr Bld 05/19/2014 5.8  4.0 - 6.0 % Final    Assessment/Plan   ICD-9-CM ICD-10-CM   1. Dementia without behavioral disturbance 294.20 F03.90  2. Solitary pulmonary nodule 793.11 R91.1   3. Chronic atrial fibrillation (HCC) 427.31 I48.2   4. Type 2 diabetes mellitus with diabetic neuropathy, with long-term current use of insulin (HCC) 250.60 E11.40    357.2 Z79.4     V58.67    5. Essential hypertension 401.9 I10   6. Chronic systolic congestive heart failure (HCC) 428.22 I50.22    428.0    7. Gout of multiple sites, unspecified cause, unspecified chronicity 274.9 M10.9   8. Seizures ([place]) 780.39 R56.9     Cont current meds as ordered  PT/OT/ST as ordered  CBG daily  Will follow  Casey Jonsson S. Perlie Gold  [facility] and Adult [facility] [address] [place], [place] [address] [phone] Cell (Monday-Friday 8 AM - 5 PM) [phone] After 5 PM and follow prompts

## 2014-09-09 ENCOUNTER — Non-Acute Institutional Stay (SKILLED_NURSING_FACILITY): Payer: Medicare Other | Admitting: Adult Health

## 2014-09-09 DIAGNOSIS — F0390 Unspecified dementia without behavioral disturbance: Secondary | ICD-10-CM

## 2014-09-09 DIAGNOSIS — I482 Chronic atrial fibrillation, unspecified: Secondary | ICD-10-CM

## 2014-09-09 DIAGNOSIS — I6990 Unspecified sequelae of unspecified cerebrovascular disease: Secondary | ICD-10-CM

## 2014-09-09 DIAGNOSIS — I5022 Chronic systolic (congestive) heart failure: Secondary | ICD-10-CM | POA: Diagnosis not present

## 2014-09-09 DIAGNOSIS — E785 Hyperlipidemia, unspecified: Secondary | ICD-10-CM

## 2014-09-09 DIAGNOSIS — R911 Solitary pulmonary nodule: Secondary | ICD-10-CM | POA: Diagnosis not present

## 2014-09-09 DIAGNOSIS — R569 Unspecified convulsions: Secondary | ICD-10-CM

## 2014-09-09 DIAGNOSIS — I10 Essential (primary) hypertension: Secondary | ICD-10-CM

## 2014-09-09 DIAGNOSIS — E1140 Type 2 diabetes mellitus with diabetic neuropathy, unspecified: Secondary | ICD-10-CM | POA: Diagnosis not present

## 2014-10-14 ENCOUNTER — Non-Acute Institutional Stay (SKILLED_NURSING_FACILITY): Payer: Medicare Other | Admitting: Adult Health

## 2014-10-14 DIAGNOSIS — R569 Unspecified convulsions: Secondary | ICD-10-CM

## 2014-10-14 DIAGNOSIS — I482 Chronic atrial fibrillation, unspecified: Secondary | ICD-10-CM

## 2014-10-14 DIAGNOSIS — M109 Gout, unspecified: Secondary | ICD-10-CM

## 2014-10-14 DIAGNOSIS — F0390 Unspecified dementia without behavioral disturbance: Secondary | ICD-10-CM

## 2014-10-14 DIAGNOSIS — I6990 Unspecified sequelae of unspecified cerebrovascular disease: Secondary | ICD-10-CM | POA: Diagnosis not present

## 2014-10-14 DIAGNOSIS — E1140 Type 2 diabetes mellitus with diabetic neuropathy, unspecified: Secondary | ICD-10-CM | POA: Diagnosis not present

## 2014-10-14 DIAGNOSIS — I5022 Chronic systolic (congestive) heart failure: Secondary | ICD-10-CM | POA: Diagnosis not present

## 2014-10-14 DIAGNOSIS — E785 Hyperlipidemia, unspecified: Secondary | ICD-10-CM

## 2014-10-14 DIAGNOSIS — R911 Solitary pulmonary nodule: Secondary | ICD-10-CM

## 2014-10-14 DIAGNOSIS — I10 Essential (primary) hypertension: Secondary | ICD-10-CM

## 2014-10-22 ENCOUNTER — Encounter: Payer: Self-pay | Admitting: Adult Health

## 2014-10-22 NOTE — Progress Notes (Signed)
Patient ID: Casey [name], female   DOB: [birth year], 74 y.o.   MRN: [id number]   Facility: [facility]      Allergies  Allergen Reactions  . Penicillins Other (See Comments)    Patient states that she was told previously by a doctor to not take this medication but is unsure why.     Chief Complaint  Patient presents with  . Medical Management of Chronic Issues    HPI:  She is a long term resident of this facility being seen for the management of her chronic illnesses. Overall her status is without change. She is due for a ct of chest in October of this year for her pulmonary nodule. She is unable to participate in the hpi or ros. There are no nursing concerns at this time.   Past Medical History  Diagnosis Date  . Chronic atrial fibrillation   . Stroke   . Hypertension   . Hypercholesterolemia   . Diabetes mellitus   . Sinus of Valsalva aneurysm     a. By 2D echo 05/2011.  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  . CHF (congestive heart failure)   . GERD (gastroesophageal reflux disease)   . Urinary frequency   . Hypopotassemia   . Long term (current) use of anticoagulants   . Unspecified disorder of kidney and ureter   . Unspecified hereditary and idiopathic peripheral neuropathy   . Unspecified late effects of cerebrovascular disease   . Edema   . Peripheral vascular disease, unspecified   . Unspecified urinary incontinence   . Congestive heart failure, unspecified   . Gout, unspecified   . Abdominal or pelvic swelling, mass, or lump, left upper quadrant   . Vascular dementia, uncomplicated   . Electrolyte and fluid disorders not elsewhere classified   . Hypopotassemia   . Long term (current) use of anticoagulants   . Chronic kidney disease   . Unspecified hereditary and idiopathic peripheral neuropathy   . Unspecified late  effects of cerebrovascular disease   . Coronary atherosclerosis of unspecified type of vessel, native or graft   . Peripheral vascular disease, unspecified   . Gout, unspecified   . Seizures     Past Surgical History  Procedure Laterality Date  . Femoral to femoral bypass graft     . Femoral artery exploration  12/06/2011    Procedure: FEMORAL ARTERY EXPLORATION;  Surgeon: Angelia Mould, MD;  Location: [facility] OR;  Service: Vascular;  Laterality: N/A;  Exploration of large pseudoaneurysm left side of fem-fem bypass graft   . Femoral-femoral bypass graft  12/06/2011    Procedure: BYPASS GRAFT FEMORAL-FEMORAL ARTERY;  Surgeon: Angelia Mould, MD;  Location: [facility] OR;  Service: Vascular;  Laterality: Bilateral;  Revision of left to right Femoral-Femoral bypass graft  . False aneurysm repair  12/06/2011    Procedure: REPAIR FALSE ANEURYSM;  Surgeon: Angelia Mould, MD;  Location: [place];  Service: Vascular;  Laterality: Left;  Repair of left femoral Artery pseudoaneurysm  . Femoral-popliteal bypass graft Left 05/2002    lower extremity femoral to below knee w/ non-versed greater saphenous vein  . Femoral-popliteal bypass graft Left 11/2002  . Tubal ligation Bilateral   . Aortogram  09/01/2008    Abd w/ bilateral lower extremity runoff arteriography    VITAL SIGNS BP 114/52 mmHg  Pulse 60  Ht 5'  6" (1.676 m)  Wt 118 lb (53.524 kg)  BMI 19.05 kg/m2  Patient's Medications  New Prescriptions   No medications on file  Previous Medications   ALLOPURINOL (ZYLOPRIM) 100 MG TABLET    Take 100 mg by mouth daily.   AMLODIPINE (NORVASC) 5 MG TABLET    Take 5 mg by mouth daily.   ASPIRIN 81 MG TABLET    Take 81 mg by mouth daily.   ATORVASTATIN (LIPITOR) 10 MG TABLET    Take 10 mg by mouth daily at 6 PM.   DIGOXIN (LANOXIN) 0.125 MG TABLET    Take 0.125 mg by mouth daily.   DONEPEZIL (ARICEPT) 10 MG TABLET    Take 1 tablet (10 mg total) by mouth at bedtime.   ENALAPRIL (VASOTEC) 2.5 MG  TABLET    Take 1 tablet (2.5 mg total) by mouth daily.   FUROSEMIDE (LASIX) 20 MG TABLET    Take 20 mg by mouth daily.   INSULIN GLARGINE (LANTUS) 100 UNIT/ML INJECTION    Inject 12 Units into the skin at bedtime.   LEVETIRACETAM (KEPPRA) 250 MG TABLET    Take 250 mg by mouth 2 (two) times daily.   METFORMIN (GLUCOPHAGE) 500 MG TABLET    Take 500 mg by mouth 2 (two) times daily with a meal.   METOPROLOL (LOPRESSOR) 50 MG TABLET    Take 50 mg by mouth 2 (two) times daily.   OMEPRAZOLE (PRILOSEC) 20 MG CAPSULE    Take 20 mg by mouth daily.   OXYBUTYNIN (DITROPAN) 5 MG TABLET    Take 1 tablet (5 mg total) by mouth 2 (two) times daily.   POTASSIUM CHLORIDE (K-DUR,KLOR-CON) 10 MEQ TABLET    Take 10 mEq by mouth daily.   SERTRALINE (ZOLOFT) 50 MG TABLET    Take 50 mg by mouth daily.   TRAMADOL (ULTRAM) 50 MG TABLET    Take 1 tablet (50 mg total) by mouth 3 (three) times daily as needed for moderate pain.  Modified Medications   No medications on file  Discontinued Medications   No medications on file     SIGNIFICANT DIAGNOSTIC EXAMS   05-21-14: ct of chest: 1. Right upper lobe 5 mm nodule unchanged. 2. Additional nodules identified, some of which are noncalcified andvalso warrants follow-up. 3. Persistent mediastinal and hilar adenopathy. This may bevlong-standing given the previous reports of adenopathy. At the [facility] next exam, consider contrast-enhanced exam for further evaluation of the lymph nodes. 4. Given patient's history of smoking, follow-up chest CT at 6-12vmonths is recommended. 5. Coronary artery disease.  LABS REVIEWED:   05-19-14: wbc 6.9; hgb 13.3; hct 41.6; mcv 86.5; plt 210; glucose 129; bun 21; creat 1.04; k+4.4; na++140; liver normal albumin 4.0; chol 144; ldl 57; trig 130; hdl 52; hgb a1c 5.8; dig 1.5  09-10-14: hgb a1c 6.3; urine micro-albumin <1.2     Review of Systems Unable to perform ROS: Dementia    Physical Exam Constitutional: No distress.  Thin   Neck:  Neck supple. No JVD present. No thyromegaly present.  Cardiovascular: Normal rate, regular rhythm and intact distal pulses.   Respiratory: Effort normal and breath sounds normal. No respiratory distress.  GI: Soft. Bowel sounds are normal. She exhibits no distension. There is no tenderness.  Musculoskeletal: She exhibits no edema.  Is able to move left extremities Has right hemiparesis    Neurological: She is alert.  Skin: Skin is warm and dry. She is not diaphoretic.  ASSESSMENT/ PLAN:  1. Gout no recent flares will continue allopurinol 100 mg daily   2. Hypertension: will continue  vasotec 2.5 mg daily lopressor 50 mg twice daily will continue norvasc  5 mg daily   3. Afib: heart rate regular will continue  Asa 81 mg daily with digoxin 125 mcg daily; lopressor 50 mg twice daily for rate control.   4. Dementia: is without change will continue aricept 10 mg daily; will monitor  5. Diabetes: will continue lantus 12 units nightly and metformin 500 mg twice daily hgb a1c is 6.3   6. Chronic systolic heart failure:  Ef is 35-40%; is stable will continue lasix 20 mg daily with k+ 10 meq daily   7. Dyslipidemia: will continue lipitor 10 mg daily ldl is 57  8. Seizures: no reports of recent activity present; will continue keppra 250 mg twice daily  9. UI: will continue ditropan 5 mg twice daily   10. Depression: will continue zoloft 50 mg daily   11. Pulmonary nodule: will continue ct scans every 6 months and will monitor her status.  Is due for ct scan in October.   Will check cbc; cmp; lipids       Ok Edwards NP [facility]  Contact [phone] Monday through Friday 8am- 5pm  After hours call [phone]

## 2014-10-22 NOTE — Progress Notes (Signed)
Patient ID: Casey [name], female   DOB: [birth year], 74 y.o.   MRN: [id number]   Facility: [facility]      Allergies  Allergen Reactions  . Penicillins Other (See Comments)    Patient states that she was told previously by a doctor to not take this medication but is unsure why.     Chief Complaint  Patient presents with  . Medical Management of Chronic Issues    HPI:  She is a long term resident of this facility being seen for the management of her chronic illnesses.  She is without significant change in her status. She is unable to participate in the hpi or ros. There are no nursing concerns at this time.    Past Medical History  Diagnosis Date  . Chronic atrial fibrillation   . Stroke   . Hypertension   . Hypercholesterolemia   . Diabetes mellitus   . Sinus of Valsalva aneurysm     a. By 2D echo 05/2011.  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  . CHF (congestive heart failure)   . GERD (gastroesophageal reflux disease)   . Urinary frequency   . Hypopotassemia   . Long term (current) use of anticoagulants   . Unspecified disorder of kidney and ureter   . Unspecified hereditary and idiopathic peripheral neuropathy   . Unspecified late effects of cerebrovascular disease   . Edema   . Peripheral vascular disease, unspecified   . Unspecified urinary incontinence   . Congestive heart failure, unspecified   . Gout, unspecified   . Abdominal or pelvic swelling, mass, or lump, left upper quadrant   . Vascular dementia, uncomplicated   . Electrolyte and fluid disorders not elsewhere classified   . Hypopotassemia   . Long term (current) use of anticoagulants   . Chronic kidney disease   . Unspecified hereditary and idiopathic peripheral neuropathy   . Unspecified late effects of cerebrovascular disease   . Coronary atherosclerosis of  unspecified type of vessel, native or graft   . Peripheral vascular disease, unspecified   . Gout, unspecified   . Seizures     Past Surgical History  Procedure Laterality Date  . Femoral to femoral bypass graft     . Femoral artery exploration  12/06/2011    Procedure: FEMORAL ARTERY EXPLORATION;  Surgeon: Angelia Mould, MD;  Location: [facility] OR;  Service: Vascular;  Laterality: N/A;  Exploration of large pseudoaneurysm left side of fem-fem bypass graft   . Femoral-femoral bypass graft  12/06/2011    Procedure: BYPASS GRAFT FEMORAL-FEMORAL ARTERY;  Surgeon: Angelia Mould, MD;  Location: [facility] OR;  Service: Vascular;  Laterality: Bilateral;  Revision of left to right Femoral-Femoral bypass graft  . False aneurysm repair  12/06/2011    Procedure: REPAIR FALSE ANEURYSM;  Surgeon: Angelia Mould, MD;  Location: [place];  Service: Vascular;  Laterality: Left;  Repair of left femoral Artery pseudoaneurysm  . Femoral-popliteal bypass graft Left 05/2002    lower extremity femoral to below knee w/ non-versed greater saphenous vein  . Femoral-popliteal bypass graft Left 11/2002  . Tubal ligation Bilateral   . Aortogram  09/01/2008    Abd w/ bilateral lower extremity runoff arteriography    VITAL SIGNS BP 100/67 mmHg  Pulse 65  Ht 5\' 6"  (1.676 m)  Wt 118 lb (53.524 kg)  BMI 19.05 kg/m2  SpO2 98%  Patient's Medications  New Prescriptions   No medications on file  Previous Medications   ALLOPURINOL (ZYLOPRIM) 100 MG TABLET    Take 100 mg by mouth daily.   AMLODIPINE (NORVASC) 5 MG TABLET    Take 5 mg by mouth daily.   ASPIRIN 81 MG TABLET    Take 81 mg by mouth daily.   ATORVASTATIN (LIPITOR) 10 MG TABLET    Take 10 mg by mouth daily at 6 PM.   DIGOXIN (LANOXIN) 0.125 MG TABLET    Take 0.125 mg by mouth daily.   DONEPEZIL (ARICEPT) 10 MG TABLET    Take 1 tablet (10 mg total) by mouth at bedtime.   ENALAPRIL (VASOTEC) 2.5 MG TABLET    Take 1 tablet (2.5 mg total) by mouth daily.     FUROSEMIDE (LASIX) 20 MG TABLET    Take 20 mg by mouth daily.   INSULIN GLARGINE (LANTUS) 100 UNIT/ML INJECTION    Inject 12 Units into the skin at bedtime.   LEVETIRACETAM (KEPPRA) 250 MG TABLET    Take 250 mg by mouth 2 (two) times daily.   METFORMIN (GLUCOPHAGE) 500 MG TABLET    Take 500 mg by mouth 2 (two) times daily with a meal.   METOPROLOL (LOPRESSOR) 50 MG TABLET    Take 50 mg by mouth 2 (two) times daily.   OMEPRAZOLE (PRILOSEC) 20 MG CAPSULE    Take 20 mg by mouth daily.   OXYBUTYNIN (DITROPAN) 5 MG TABLET    Take 1 tablet (5 mg total) by mouth 2 (two) times daily.   POTASSIUM CHLORIDE (K-DUR,KLOR-CON) 10 MEQ TABLET    Take 10 mEq by mouth daily.   SERTRALINE (ZOLOFT) 50 MG TABLET    Take 50 mg by mouth daily.   TRAMADOL (ULTRAM) 50 MG TABLET    Take 1 tablet (50 mg total) by mouth 3 (three) times daily as needed for moderate pain.  Modified Medications   No medications on file  Discontinued Medications   No medications on file     SIGNIFICANT DIAGNOSTIC EXAMS   05-21-14: ct of chest: 1. Right upper lobe 5 mm nodule unchanged. 2. Additional nodules identified, some of which are noncalcified andvalso warrants follow-up. 3. Persistent mediastinal and hilar adenopathy. This may bevlong-standing given the previous reports of adenopathy. At the [facility] next exam, consider contrast-enhanced exam for further evaluation of the lymph nodes. 4. Given patient's history of smoking, follow-up chest CT at 6-12vmonths is recommended. 5. Coronary artery disease.  LABS REVIEWED:   05-19-14: wbc 6.9; hgb 13.3; hct 41.6; mcv 86.5; plt 210; glucose 129; bun 21; creat 1.04; k+4.4; na++140; liver normal albumin 4.0; chol 144; ldl 57; trig 130; hdl 52; hgb a1c 5.8; dig 1.5     Review of Systems  Unable to perform ROS: Dementia      Physical Exam Constitutional: No distress.  Thin   Neck: Neck supple. No JVD present. No thyromegaly present.  Cardiovascular: Normal rate, regular rhythm  and intact distal pulses.   Respiratory: Effort normal and breath sounds normal. No respiratory distress.  GI: Soft. Bowel sounds are normal. She exhibits no distension. There is no tenderness.  Musculoskeletal: She exhibits no edema.  Is able to move left extremities Has right hemiparesis    Neurological: She is alert.  Skin: Skin is warm and dry. She is not diaphoretic.       ASSESSMENT/ PLAN:  1. Gout no recent flares will continue allopurinol 100 mg daily  2. Hypertension: will continue  vasotec 2.5 mg daily lopressor 50 mg twice daily will continue norvasc  5 mg daily   3. Afib: heart rate regular will continue  Asa 81 mg daily with digoxin 125 mcg daily; lopressor 50 mg twice daily for rate control.   4. Dementia: is without change will continue aricept 10 mg daily; will monitor  5. Diabetes: will continue lantus 12 units nightly and metformin 500 mg twice daily hgb a1c is 5.8   6. Chronic systolic heart failure:  Ef is 35-40%; is stable will continue lasix 20 mg daily with k+ 10 meq daily   7. Dyslipidemia: will continue lipitor 10 mg daily ldl is 57  8. Seizures: no reports of recent activity present; will continue keppra 250 mg twice daily  9. UI: will continue ditropan 5 mg twice daily   10. Depression: will continue zoloft 50 mg daily   11. Pulmonary nodule: will continue ct scans every 6 months and will monitor her status.      Ok Edwards NP [facility]  Contact [phone] Monday through Friday 8am- 5pm  After hours call [phone]

## 2014-11-09 ENCOUNTER — Non-Acute Institutional Stay (SKILLED_NURSING_FACILITY): Payer: Medicare Other | Admitting: Adult Health

## 2014-11-09 DIAGNOSIS — R911 Solitary pulmonary nodule: Secondary | ICD-10-CM | POA: Diagnosis not present

## 2014-11-09 DIAGNOSIS — E1140 Type 2 diabetes mellitus with diabetic neuropathy, unspecified: Secondary | ICD-10-CM

## 2014-11-09 DIAGNOSIS — Z794 Long term (current) use of insulin: Secondary | ICD-10-CM | POA: Diagnosis not present

## 2014-11-09 DIAGNOSIS — I5022 Chronic systolic (congestive) heart failure: Secondary | ICD-10-CM | POA: Diagnosis not present

## 2014-11-09 DIAGNOSIS — M109 Gout, unspecified: Secondary | ICD-10-CM

## 2014-11-09 DIAGNOSIS — I482 Chronic atrial fibrillation, unspecified: Secondary | ICD-10-CM

## 2014-11-09 DIAGNOSIS — F0390 Unspecified dementia without behavioral disturbance: Secondary | ICD-10-CM | POA: Diagnosis not present

## 2014-11-09 DIAGNOSIS — I10 Essential (primary) hypertension: Secondary | ICD-10-CM | POA: Diagnosis not present

## 2014-11-24 LAB — HM DIABETES EYE EXAM

## 2014-11-27 ENCOUNTER — Encounter: Payer: Self-pay | Admitting: Adult Health

## 2014-11-27 NOTE — Progress Notes (Addendum)
Patient ID: Casey [name], female   DOB: 1940-08-10, 74 y.o.   MRN: [id number]    Facility: [facility]      Allergies  Allergen Reactions  . Penicillins Other (See Comments)    Patient states that she was told previously by a doctor to not take this medication but is unsure why.     Chief Complaint  Patient presents with  . Medical Management of Chronic Issues    HPI:  She is a long term resident of this facility being seen for the management of her chronic illnesses. She is doing well overall. She is unable to fully participate in the hpi or ros.  There are no nursing concerns at this time   Past Medical History  Diagnosis Date  . Chronic atrial fibrillation ([place])   . Stroke ([place])   . Hypertension   . Hypercholesterolemia   . Diabetes mellitus   . Sinus of Valsalva aneurysm     a. By 2D echo 05/2011.  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  . CHF (congestive heart failure) ([place])   . GERD (gastroesophageal reflux disease)   . Urinary frequency   . Hypopotassemia   . Long term (current) use of anticoagulants   . Unspecified disorder of kidney and ureter   . Unspecified hereditary and idiopathic peripheral neuropathy   . Unspecified late effects of cerebrovascular disease   . Edema   . Peripheral vascular disease, unspecified ([place])   . Unspecified urinary incontinence   . Congestive heart failure, unspecified   . Gout, unspecified   . Abdominal or pelvic swelling, mass, or lump, left upper quadrant   . Vascular dementia, uncomplicated   . Electrolyte and fluid disorders not elsewhere classified   . Hypopotassemia   . Long term (current) use of anticoagulants   . Chronic kidney disease   . Unspecified hereditary and idiopathic peripheral neuropathy   . Unspecified late effects of cerebrovascular disease   . Coronary  atherosclerosis of unspecified type of vessel, native or graft   . Peripheral vascular disease, unspecified ([place])   . Gout, unspecified   . Seizures [facility])      Past Surgical History  Procedure Laterality Date  . Femoral to femoral bypass graft     . Femoral artery exploration  12/06/2011    Procedure: FEMORAL ARTERY EXPLORATION;  Surgeon: Angelia Mould, MD;  Location: [facility] OR;  Service: Vascular;  Laterality: N/A;  Exploration of large pseudoaneurysm left side of fem-fem bypass graft   . Femoral-femoral bypass graft  12/06/2011    Procedure: BYPASS GRAFT FEMORAL-FEMORAL ARTERY;  Surgeon: Angelia Mould, MD;  Location: [facility] OR;  Service: Vascular;  Laterality: Bilateral;  Revision of left to right Femoral-Femoral bypass graft  . False aneurysm repair  12/06/2011    Procedure: REPAIR FALSE ANEURYSM;  Surgeon: Angelia Mould, MD;  Location: [place];  Service: Vascular;  Laterality: Left;  Repair of left femoral Artery pseudoaneurysm  . Femoral-popliteal bypass graft Left 05/2002    lower extremity femoral to below knee w/ non-versed greater saphenous vein  . Femoral-popliteal bypass graft Left 11/2002  . Tubal ligation Bilateral   . Aortogram  09/01/2008    Abd w/ bilateral lower extremity runoff arteriography    VITAL SIGNS BP 132/68 mmHg  Pulse 70  Ht 5\' 6"  (1.676 m)  Wt 117 lb (  53.071 kg)  BMI 18.89 kg/m2  Patient's Medications  New Prescriptions   No medications on file  Previous Medications   ALLOPURINOL (ZYLOPRIM) 100 MG TABLET    Take 100 mg by mouth daily.   AMLODIPINE (NORVASC) 5 MG TABLET    Take 5 mg by mouth daily.   ASPIRIN 81 MG TABLET    Take 81 mg by mouth daily.   ATORVASTATIN (LIPITOR) 10 MG TABLET    Take 10 mg by mouth daily at 6 PM.   DIGOXIN (LANOXIN) 0.125 MG TABLET    Take 0.125 mg by mouth daily.   DONEPEZIL (ARICEPT) 10 MG TABLET    Take 1 tablet (10 mg total) by mouth at bedtime.   ENALAPRIL (VASOTEC) 2.5 MG TABLET    Take 1 tablet (2.5 mg  total) by mouth daily.   FUROSEMIDE (LASIX) 20 MG TABLET    Take 20 mg by mouth daily.   INSULIN GLARGINE (LANTUS) 100 UNIT/ML INJECTION    Inject 12 Units into the skin at bedtime.   LEVETIRACETAM (KEPPRA) 250 MG TABLET    Take 250 mg by mouth 2 (two) times daily.   METFORMIN (GLUCOPHAGE) 500 MG TABLET    Take 500 mg by mouth 2 (two) times daily with a meal.   METOPROLOL (LOPRESSOR) 50 MG TABLET    Take 50 mg by mouth 2 (two) times daily.   OMEPRAZOLE (PRILOSEC) 20 MG CAPSULE    Take 20 mg by mouth daily.   OXYBUTYNIN (DITROPAN) 5 MG TABLET    Take 1 tablet (5 mg total) by mouth 2 (two) times daily.   POTASSIUM CHLORIDE (K-DUR,KLOR-CON) 10 MEQ TABLET    Take 10 mEq by mouth daily.   SERTRALINE (ZOLOFT) 50 MG TABLET    Take 50 mg by mouth daily.  Modified Medications   No medications on file  Discontinued Medications   TRAMADOL (ULTRAM) 50 MG TABLET    Take 1 tablet (50 mg total) by mouth 3 (three) times daily as needed for moderate pain.     SIGNIFICANT DIAGNOSTIC EXAMS    05-21-14: ct of chest: 1. Right upper lobe 5 mm nodule unchanged. 2. Additional nodules identified, some of which are noncalcified andvalso warrants follow-up. 3. Persistent mediastinal and hilar adenopathy. This may bevlong-standing given the previous reports of adenopathy. At the [facility] next exam, consider contrast-enhanced exam for further evaluation of the lymph nodes. 4. Given patient's history of smoking, follow-up chest CT at 6-12vmonths is recommended. 5. Coronary artery disease.  LABS REVIEWED:   05-19-14: wbc 6.9; hgb 13.3; hct 41.6; mcv 86.5; plt 210; glucose 129; bun 21; creat 1.04; k+4.4; na++140; liver normal albumin 4.0; chol 144; ldl 57; trig 130; hdl 52; hgb a1c 5.8; dig 1.5  09-10-14: hgb a1c 6.3; urine micro-albumin <1.2 10-15-14: wbc 5.6; hgb 12.5; hct 41.8; mcv 89.9; plt 209; glucose 88; bun 17.6; creat 0.98; k+ 4.8; na++144; liver normal albumin 4.0; ca 10.3; chol 152; ldl 75; trig 159; hdl 45       Review of Systems Unable to perform ROS: Dementia      Physical Exam Constitutional: No distress.  Thin   Neck: Neck supple. No JVD present. No thyromegaly present.  Cardiovascular: Normal rate, regular rhythm and intact distal pulses.   Respiratory: Effort normal and breath sounds normal. No respiratory distress.  GI: Soft. Bowel sounds are normal. She exhibits no distension. There is no tenderness.  Musculoskeletal: She exhibits no edema.  Is able to move left extremities Has  right hemiparesis    Neurological: She is alert.  Skin: Skin is warm and dry. She is not diaphoretic.     ASSESSMENT/ PLAN:  1. Gout no recent flares will continue allopurinol 100 mg daily   2. Hypertension: will continue  vasotec 2.5 mg daily lopressor 50 mg twice daily will continue norvasc  5 mg daily   3. Afib: heart rate regular will continue  Asa 81 mg daily with digoxin 125 mcg daily; lopressor 50 mg twice daily for rate control.   4. Dementia: is without change will continue aricept 10 mg daily; will monitor  5. Diabetes: will continue lantus 12 units nightly and metformin 500 mg twice daily hgb a1c is 6.3   6. Chronic systolic heart failure:  Ef is 35-40%; is stable will continue lasix 20 mg daily with k+ 10 meq daily   7. Dyslipidemia: will continue lipitor 10 mg daily ldl is 57  8. Seizures: no reports of recent activity present; will continue keppra 250 mg twice daily  9. UI: will continue ditropan 5 mg twice daily   10. Depression: will continue zoloft 50 mg daily   11. Pulmonary nodule: will continue ct scans every 6 months and will monitor her status.  Is due for ct scan in October.        Ok Edwards NP [facility]  Contact [phone] Monday through Friday 8am- 5pm  After hours call [phone]

## 2014-12-06 ENCOUNTER — Encounter: Payer: Self-pay | Admitting: Internal Medicine

## 2014-12-06 ENCOUNTER — Non-Acute Institutional Stay (SKILLED_NURSING_FACILITY): Payer: Medicare Other | Admitting: Internal Medicine

## 2014-12-06 DIAGNOSIS — E1140 Type 2 diabetes mellitus with diabetic neuropathy, unspecified: Secondary | ICD-10-CM | POA: Diagnosis not present

## 2014-12-06 DIAGNOSIS — Z794 Long term (current) use of insulin: Secondary | ICD-10-CM

## 2014-12-06 DIAGNOSIS — E1122 Type 2 diabetes mellitus with diabetic chronic kidney disease: Secondary | ICD-10-CM | POA: Insufficient documentation

## 2014-12-06 DIAGNOSIS — N183 Chronic kidney disease, stage 3 (moderate): Secondary | ICD-10-CM | POA: Diagnosis not present

## 2014-12-06 DIAGNOSIS — E785 Hyperlipidemia, unspecified: Secondary | ICD-10-CM | POA: Diagnosis not present

## 2014-12-06 NOTE — Progress Notes (Signed)
MRN: [id number] Name: Casey [name]  Sex: female Age: 74 y.o. DOB: Jun 16, 1940  [place] #: Karren Burly Facility/Room:106 Level Of Care: SNF Provider: Inocencio Homes D Emergency Contacts: Extended Emergency Contact Information Primary Emergency Contact: Judd Lien of [place] Phone: [phone] Relation: Daughter Secondary Emergency Contact: Alfonzo Beers, [place] of [place] Phone: [phone] Mobile Phone: [phone] Relation: Niece  Code Status:   Allergies: Penicillins  Chief Complaint  Patient presents with  . Medical Management of Chronic Issues    HPI: Patient is 74 y.o. female with chronic AF, HTN, HLD, DM2CHF, GERD, vascular dementia and remote CLL vs lymphoma who is being seen for routine issues of DM2, CKD, and HLD.   Past Medical History  Diagnosis Date  . Chronic atrial fibrillation ([place])   . Stroke ([place])   . Hypertension   . Hypercholesterolemia   . Diabetes mellitus   . Sinus of Valsalva aneurysm     a. By 2D echo 05/2011.  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  . CHF (congestive heart failure) ([place])   . GERD (gastroesophageal reflux disease)   . Urinary frequency   . Hypopotassemia   . Long term (current) use of anticoagulants   . Unspecified disorder of kidney and ureter   . Unspecified hereditary and idiopathic peripheral neuropathy   . Unspecified late effects of cerebrovascular disease   . Edema   . Peripheral vascular disease, unspecified ([place])   . Unspecified urinary incontinence   . Congestive heart failure, unspecified   . Gout, unspecified   . Abdominal or pelvic swelling, mass, or lump, left upper quadrant   . Vascular dementia, uncomplicated   . Electrolyte and fluid disorders not elsewhere classified   . Hypopotassemia   . Long term (current)  use of anticoagulants   . Chronic kidney disease   . Unspecified hereditary and idiopathic peripheral neuropathy   . Unspecified late effects of cerebrovascular disease   . Coronary atherosclerosis of unspecified type of vessel, native or graft   . Peripheral vascular disease, unspecified ([place])   . Gout, unspecified   . Seizures [facility])     Past Surgical History  Procedure Laterality Date  . Femoral to femoral bypass graft     . Femoral artery exploration  12/06/2011    Procedure: FEMORAL ARTERY EXPLORATION;  Surgeon: Angelia Mould, MD;  Location: [facility] OR;  Service: Vascular;  Laterality: N/A;  Exploration of large pseudoaneurysm left side of fem-fem bypass graft   . Femoral-femoral bypass graft  12/06/2011    Procedure: BYPASS GRAFT FEMORAL-FEMORAL ARTERY;  Surgeon: Angelia Mould, MD;  Location: [facility] OR;  Service: Vascular;  Laterality: Bilateral;  Revision of left to right Femoral-Femoral bypass graft  . False aneurysm repair  12/06/2011    Procedure: REPAIR FALSE ANEURYSM;  Surgeon: Angelia Mould, MD;  Location: [place];  Service: Vascular;  Laterality: Left;  Repair of left femoral Artery pseudoaneurysm  . Femoral-popliteal bypass graft Left 05/2002    lower extremity femoral to below knee w/ non-versed greater saphenous vein  . Femoral-popliteal bypass graft Left 11/2002  . Tubal ligation Bilateral   . Aortogram  09/01/2008    Abd w/ bilateral lower extremity runoff arteriography      Medication List       This list is accurate  as of: 12/06/14  9:30 PM.  Always use your most recent med list.               allopurinol 100 MG tablet  Commonly known as:  ZYLOPRIM  Take 100 mg by mouth daily.     amLODipine 5 MG tablet  Commonly known as:  NORVASC  Take 5 mg by mouth daily.     aspirin 81 MG tablet  Take 81 mg by mouth daily.     atorvastatin 10 MG tablet  Commonly known as:  LIPITOR  Take 10 mg by mouth daily at 6 PM.     digoxin 0.125 MG tablet   Commonly known as:  LANOXIN  Take 0.125 mg by mouth daily.     donepezil 10 MG tablet  Commonly known as:  ARICEPT  Take 1 tablet (10 mg total) by mouth at bedtime.     enalapril 2.5 MG tablet  Commonly known as:  VASOTEC  Take 1 tablet (2.5 mg total) by mouth daily.     furosemide 20 MG tablet  Commonly known as:  LASIX  Take 20 mg by mouth daily.     insulin glargine 100 UNIT/ML injection  Commonly known as:  LANTUS  Inject 12 Units into the skin at bedtime.     levETIRAcetam 250 MG tablet  Commonly known as:  KEPPRA  Take 250 mg by mouth 2 (two) times daily.     metFORMIN 500 MG tablet  Commonly known as:  GLUCOPHAGE  Take 500 mg by mouth 2 (two) times daily with a meal.     metoprolol 50 MG tablet  Commonly known as:  LOPRESSOR  Take 50 mg by mouth 2 (two) times daily.     omeprazole 20 MG capsule  Commonly known as:  PRILOSEC  Take 20 mg by mouth daily.     oxybutynin 5 MG tablet  Commonly known as:  DITROPAN  Take 1 tablet (5 mg total) by mouth 2 (two) times daily.     potassium chloride 10 MEQ tablet  Commonly known as:  K-DUR,KLOR-CON  Take 10 mEq by mouth daily.     sertraline 50 MG tablet  Commonly known as:  ZOLOFT  Take 50 mg by mouth daily.        No orders of the defined types were placed in this encounter.    Immunization History  Administered Date(s) Administered  . Influenza Split 12/09/2011  . Influenza-Unspecified 11/27/2013  . Pneumococcal Polysaccharide-23 02/05/2013    Social History  Substance Use Topics  . Smoking status: Former Smoker    Types: Cigarettes    Quit date: 01/29/2001  . Smokeless tobacco: Never Used  . Alcohol Use: No    Review of Systems  DATA OBTAINED: from patient, nurse- no c/o and no concerns GENERAL:  no fevers, fatigue, appetite changes SKIN: No itching, rash HEENT: No complaint RESPIRATORY: No cough, wheezing, SOB CARDIAC: No chest pain, palpitations, lower extremity edema  GI: No abdominal  pain, No N/V/D or constipation, No heartburn or reflux  GU: No dysuria, frequency or urgency, or incontinence  MUSCULOSKELETAL: No unrelieved bone/joint pain NEUROLOGIC: No headache, dizziness  PSYCHIATRIC: No overt anxiety or sadness  Filed Vitals:   12/06/14 1632  BP: 99/66  Pulse: 70  Temp: 98.7 F (37.1 C)  Resp: 19    Physical Exam  GENERAL APPEARANCE: Alert, conversant, thin BF, hiding head under covers,No acute distress  SKIN: No diaphoresis rash HEENT: Unremarkable RESPIRATORY: Breathing is even,  unlabored. Lung sounds are clear   CARDIOVASCULAR: Heart RRR no murmurs, rubs or gallops. No peripheral edema  GASTROINTESTINAL: Abdomen is soft, non-tender, not distended w/ normal bowel sounds.  GENITOURINARY: Bladder non tender, not distended  MUSCULOSKELETAL: No abnormal joints or musculature NEUROLOGIC: Cranial nerves 2-12 grossly intact. Moves BUE PSYCHIATRIC: pleasant, odd, no behavioral issues  Patient Active Problem List   Diagnosis Date Noted  . CKD stage 3 due to type 2 diabetes mellitus ([place]) 12/06/2014  . Essential hypertension 03/09/2014  . Absence of bladder continence 03/09/2014  . 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 Chronic systolic congestive heart failure ([place]) 02/09/2013  . Solitary pulmonary nodule 02/09/2013  . Seizures ([place])   . Vision disturbance following CVA (cerebrovascular accident) 02/02/2013  . PVD (peripheral vascular disease) ([place]) 09/30/2012  . Gout 05/06/2012  . Dementia without behavioral disturbance 05/06/2012  . 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 Hyperlipidemia 06/18/2011  . Type 2 diabetes mellitus with diabetic neuropathy ([place]) 12/29/2010  . Chronic atrial fibrillation ([place])   . Late effects of CVA (cerebrovascular accident)     CBC    Component Value Date/Time   WBC 6.9 05/19/2014   WBC 6.2 02/04/2013 0535   RBC 5.12* 02/04/2013 0535   HGB 13.3 05/19/2014   HCT 42  05/19/2014   PLT 210 05/19/2014   MCV 84.8 02/04/2013 0535   LYMPHSABS 1.8 02/02/2013 1845   MONOABS 0.6 02/02/2013 1845   EOSABS 0.3 02/02/2013 1845   BASOSABS 0.1 02/02/2013 1845    CMP     Component Value Date/Time   NA 140 05/19/2014   NA 138 02/04/2013 0535   K 4.4 05/19/2014   CL 101 02/04/2013 0535   CO2 21 02/04/2013 0535   GLUCOSE 152* 02/04/2013 0535   GLUCOSE 221* 05/06/2012 1140   BUN 17 02/04/2013 0535   BUN 24 05/06/2012 1140   CREATININE 1.0 05/19/2014   CREATININE 1.00 02/04/2013 0535   CALCIUM 10.1 02/04/2013 0535   PROT 7.9 02/02/2013 1845   PROT 7.6 04/28/2012 1117   ALBUMIN 3.7 02/02/2013 1845   ALBUMIN 4.3 04/28/2012 1117   AST 20 05/19/2014   ALT 22 05/19/2014   ALKPHOS 93 02/02/2013 1845   BILITOT 0.4 02/02/2013 1845   GFRNONAA 55* 02/04/2013 0535   GFRAA 64* 02/04/2013 0535    Assessment and Plan  Type 2 diabetes mellitus with diabetic neuropathy Stable on metformin 500 BID and evening lantus 12 u; A1c 6.3 in 08/2014;mocro alb < 1.2; pt on ACE and statin ;Plan - cont all current meds  CKD stage 3 due to type 2 diabetes mellitus ([place]) 10/2014 GFR 58.9 with Cr 0.98; DM well controlled, on statin ;plan - monitor at intervals   Hyperlipidemia In 09/2014 LDL 75, HDL 45, both are acceptable; Plan - cont lipitor 10 mg daily    Hennie Duos, MD

## 2014-12-06 NOTE — Assessment & Plan Note (Signed)
Stable on metformin 500 BID and evening lantus 12 u; A1c 6.3 in 08/2014;mocro alb < 1.2; pt on ACE and statin ;Plan - cont all current meds

## 2014-12-06 NOTE — Assessment & Plan Note (Signed)
10/2014 GFR 58.9 with Cr 0.98; DM well controlled, on statin ;plan - monitor at intervals

## 2014-12-06 NOTE — Assessment & Plan Note (Signed)
In 09/2014 LDL 75, HDL 45, both are acceptable; Plan - cont lipitor 10 mg daily

## 2014-12-30 ENCOUNTER — Encounter: Payer: Self-pay | Admitting: Adult Health

## 2014-12-30 ENCOUNTER — Non-Acute Institutional Stay (SKILLED_NURSING_FACILITY): Payer: Medicare Other | Admitting: Adult Health

## 2014-12-30 DIAGNOSIS — R32 Unspecified urinary incontinence: Secondary | ICD-10-CM

## 2014-12-30 DIAGNOSIS — R569 Unspecified convulsions: Secondary | ICD-10-CM

## 2014-12-30 DIAGNOSIS — Z794 Long term (current) use of insulin: Secondary | ICD-10-CM

## 2014-12-30 DIAGNOSIS — E1169 Type 2 diabetes mellitus with other specified complication: Secondary | ICD-10-CM

## 2014-12-30 DIAGNOSIS — F0390 Unspecified dementia without behavioral disturbance: Secondary | ICD-10-CM | POA: Diagnosis not present

## 2014-12-30 DIAGNOSIS — I5022 Chronic systolic (congestive) heart failure: Secondary | ICD-10-CM | POA: Diagnosis not present

## 2014-12-30 DIAGNOSIS — R911 Solitary pulmonary nodule: Secondary | ICD-10-CM

## 2014-12-30 DIAGNOSIS — I6990 Unspecified sequelae of unspecified cerebrovascular disease: Secondary | ICD-10-CM

## 2014-12-30 DIAGNOSIS — I482 Chronic atrial fibrillation, unspecified: Secondary | ICD-10-CM

## 2014-12-30 DIAGNOSIS — E1140 Type 2 diabetes mellitus with diabetic neuropathy, unspecified: Secondary | ICD-10-CM

## 2014-12-30 DIAGNOSIS — E785 Hyperlipidemia, unspecified: Secondary | ICD-10-CM

## 2014-12-30 DIAGNOSIS — M109 Gout, unspecified: Secondary | ICD-10-CM | POA: Diagnosis not present

## 2014-12-30 DIAGNOSIS — I110 Hypertensive heart disease with heart failure: Secondary | ICD-10-CM | POA: Diagnosis not present

## 2014-12-30 NOTE — Progress Notes (Signed)
Patient ID: Casey [name], female   DOB: January 01, 1941, 74 y.o.   MRN: [id number]   Facility:  [facility]      Allergies  Allergen Reactions  . Penicillins Other (See Comments)    Patient states that she was told previously by a doctor to not take this medication but is unsure why.     Chief Complaint  Patient presents with  . Medical Management of Chronic Issues    HPI:  She is a long term resident of this facility being seen for the management of her chronic illnesses.  Overall there is little change in her status. Her current weight is 116 pounds and is currently stable. She is unable to fully participate in the hpi or ros. There are no nursing concerns at this time   Past Medical History  Diagnosis Date  . Chronic atrial fibrillation ([place])   . Stroke ([place])   . Hypertension   . Hypercholesterolemia   . Diabetes mellitus   . Sinus of Valsalva aneurysm     a. By 2D echo 05/2011.  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  . CHF (congestive heart failure) ([place])   . GERD (gastroesophageal reflux disease)   . Urinary frequency   . Hypopotassemia   . Long term (current) use of anticoagulants   . Unspecified disorder of kidney and ureter   . Unspecified hereditary and idiopathic peripheral neuropathy   . Unspecified late effects of cerebrovascular disease   . Edema   . Peripheral vascular disease, unspecified ([place])   . Unspecified urinary incontinence   . Congestive heart failure, unspecified   . Gout, unspecified   . Abdominal or pelvic swelling, mass, or lump, left upper quadrant   . Vascular dementia, uncomplicated   . Electrolyte and fluid disorders not elsewhere classified   . Hypopotassemia   . Long term (current) use of anticoagulants   . Chronic kidney disease   . Unspecified hereditary and idiopathic peripheral neuropathy   .  Unspecified late effects of cerebrovascular disease   . Coronary atherosclerosis of unspecified type of vessel, native or graft   . Peripheral vascular disease, unspecified ([place])   . Gout, unspecified   . Seizures [facility])     Past Surgical History  Procedure Laterality Date  . Femoral to femoral bypass graft     . Femoral artery exploration  12/06/2011    Procedure: FEMORAL ARTERY EXPLORATION;  Surgeon: Angelia Mould, MD;  Location: [facility] OR;  Service: Vascular;  Laterality: N/A;  Exploration of large pseudoaneurysm left side of fem-fem bypass graft   . Femoral-femoral bypass graft  12/06/2011    Procedure: BYPASS GRAFT FEMORAL-FEMORAL ARTERY;  Surgeon: Angelia Mould, MD;  Location: [facility] OR;  Service: Vascular;  Laterality: Bilateral;  Revision of left to right Femoral-Femoral bypass graft  . False aneurysm repair  12/06/2011    Procedure: REPAIR FALSE ANEURYSM;  Surgeon: Angelia Mould, MD;  Location: [place];  Service: Vascular;  Laterality: Left;  Repair of left femoral Artery pseudoaneurysm  . Femoral-popliteal bypass graft Left 05/2002    lower extremity femoral to below knee w/ non-versed greater saphenous vein  . Femoral-popliteal bypass graft Left 11/2002  . Tubal ligation Bilateral   . Aortogram  09/01/2008    Abd w/ bilateral lower extremity runoff arteriography    VITAL SIGNS BP 121/65 mmHg  Pulse 65  Ht 5\' 6"  (1.676 m)  Wt 116 lb (52.617 kg)  BMI 18.73 kg/m2  Patient's Medications  New Prescriptions   No medications on file  Previous Medications   ALLOPURINOL (ZYLOPRIM) 100 MG TABLET    Take 100 mg by mouth daily.   AMLODIPINE (NORVASC) 5 MG TABLET    Take 5 mg by mouth daily.   ASPIRIN 81 MG TABLET    Take 81 mg by mouth daily.   ATORVASTATIN (LIPITOR) 10 MG TABLET    Take 10 mg by mouth daily at 6 PM.   DIGOXIN (LANOXIN) 0.125 MG TABLET    Take 0.125 mg by mouth daily.   DONEPEZIL (ARICEPT) 10 MG TABLET    Take 1 tablet (10 mg total) by mouth at bedtime.     ENALAPRIL (VASOTEC) 2.5 MG TABLET    Take 1 tablet (2.5 mg total) by mouth daily.   FUROSEMIDE (LASIX) 20 MG TABLET    Take 20 mg by mouth daily.   INSULIN GLARGINE (LANTUS) 100 UNIT/ML INJECTION    Inject 12 Units into the skin at bedtime.   LEVETIRACETAM (KEPPRA) 250 MG TABLET    Take 250 mg by mouth 2 (two) times daily.   METFORMIN (GLUCOPHAGE) 500 MG TABLET    Take 500 mg by mouth 2 (two) times daily with a meal.   METOPROLOL (LOPRESSOR) 50 MG TABLET    Take 50 mg by mouth 2 (two) times daily.   OMEPRAZOLE (PRILOSEC) 20 MG CAPSULE    Take 20 mg by mouth daily.   OXYBUTYNIN (DITROPAN) 5 MG TABLET    Take 1 tablet (5 mg total) by mouth 2 (two) times daily.   POTASSIUM CHLORIDE (K-DUR,KLOR-CON) 10 MEQ TABLET    Take 10 mEq by mouth daily.   SERTRALINE (ZOLOFT) 50 MG TABLET    Take 50 mg by mouth daily.   TRAMADOL (ULTRAM) 50 MG TABLET    Take 50 mg by mouth every 8 (eight) hours as needed.  Modified Medications   No medications on file  Discontinued Medications   No medications on file     SIGNIFICANT DIAGNOSTIC EXAMS   05-21-14: ct of chest: 1. Right upper lobe 5 mm nodule unchanged. 2. Additional nodules identified, some of which are noncalcified andvalso warrants follow-up. 3. Persistent mediastinal and hilar adenopathy. This may bevlong-standing given the previous reports of adenopathy. At the [facility] next exam, consider contrast-enhanced exam for further evaluation of the lymph nodes. 4. Given patient's history of smoking, follow-up chest CT at 6-12vmonths is recommended. 5. Coronary artery disease.  LABS REVIEWED:   05-19-14: wbc 6.9; hgb 13.3; hct 41.6; mcv 86.5; plt 210; glucose 129; bun 21; creat 1.04; k+4.4; na++140; liver normal albumin 4.0; chol 144; ldl 57; trig 130; hdl 52; hgb a1c 5.8; dig 1.5  09-10-14: hgb a1c 6.3; urine micro-albumin <1.2 10-15-14: wbc 5.6; hgb 12.5; hct 41.8; mcv 89.9; plt 209; glucose 88; bun 17.6; creat 0.98; k+ 4.8; na++144; liver normal albumin  4.0; ca 10.3; chol 152; ldl 75; trig 159; hdl 45  11-11-14: dig 1.01 10-15-14: wbc 5.6 ;hgb 12.5; hct 41.8; mcv 89.9; plt 209; glucose 88; bun 17.6; creat 0.98; k+ 4.8; na++144; liver normal albumin 4.0; chol 152; ;ldl 75; trig 159; hdl 45      Review of Systems Unable to perform ROS: Dementia      Physical Exam Constitutional: No distress.  Thin   Neck: Neck supple. No JVD present. No thyromegaly present.  Cardiovascular: Normal rate, regular rhythm  and intact distal pulses.   Respiratory: Effort normal and breath sounds normal. No respiratory distress.  GI: Soft. Bowel sounds are normal. She exhibits no distension. There is no tenderness.  Musculoskeletal: She exhibits no edema.  Is able to move left extremities Has right hemiparesis    Neurological: She is alert.  Skin: Skin is warm and dry. She is not diaphoretic.     ASSESSMENT/ PLAN:  1. Gout no recent flares will continue allopurinol 100 mg daily   2. Hypertension: will continue  vasotec 2.5 mg daily lopressor 50 mg twice daily will continue norvasc  5 mg daily   3. Afib: heart rate regular will continue  Asa 81 mg daily with digoxin 125 mcg daily dig level is 1.01; lopressor 50 mg twice daily for rate control.   4. Dementia: is without change will continue aricept 10 mg daily; will monitor  5. Diabetes: will continue lantus 12 units nightly and metformin 500 mg twice daily hgb a1c is 6.3   6. Chronic systolic heart failure:  Ef is 35-40%; is stable will continue lasix 20 mg daily with k+ 10 meq daily   7. Dyslipidemia: will continue lipitor 10 mg daily ldl is 75; trig 159   8. Seizures: no reports of recent activity present; will continue keppra 250 mg twice daily  9. UI: will continue ditropan 5 mg twice daily   10. Depression: will continue zoloft 50 mg daily   11. Pulmonary nodule: will continue ct scans every 6 months and will monitor her status.  CT scan pending.   Will check hgb a1c           Ok Edwards NP [facility]  Contact [phone] Monday through Friday 8am- 5pm  After hours call [phone]

## 2014-12-31 ENCOUNTER — Other Ambulatory Visit: Payer: Self-pay | Admitting: Internal Medicine

## 2014-12-31 DIAGNOSIS — R911 Solitary pulmonary nodule: Secondary | ICD-10-CM

## 2014-12-31 LAB — HEMOGLOBIN A1C: HEMOGLOBIN A1C: 6.1

## 2015-01-10 ENCOUNTER — Ambulatory Visit
Admission: RE | Admit: 2015-01-10 | Discharge: 2015-01-10 | Disposition: A | Discharge status: Home / Self Care | Payer: Medicare Other | Source: Ambulatory Visit | Attending: Internal Medicine | Admitting: Internal Medicine

## 2015-01-10 DIAGNOSIS — R911 Solitary pulmonary nodule: Secondary | ICD-10-CM

## 2015-02-01 ENCOUNTER — Non-Acute Institutional Stay (SKILLED_NURSING_FACILITY): Payer: Medicare Other | Admitting: Adult Health

## 2015-02-01 ENCOUNTER — Encounter: Payer: Self-pay | Admitting: Adult Health

## 2015-02-01 DIAGNOSIS — E1140 Type 2 diabetes mellitus with diabetic neuropathy, unspecified: Secondary | ICD-10-CM | POA: Diagnosis not present

## 2015-02-01 DIAGNOSIS — E1169 Type 2 diabetes mellitus with other specified complication: Secondary | ICD-10-CM

## 2015-02-01 DIAGNOSIS — Z794 Long term (current) use of insulin: Secondary | ICD-10-CM

## 2015-02-01 DIAGNOSIS — R32 Unspecified urinary incontinence: Secondary | ICD-10-CM | POA: Diagnosis not present

## 2015-02-01 DIAGNOSIS — F0390 Unspecified dementia without behavioral disturbance: Secondary | ICD-10-CM

## 2015-02-01 DIAGNOSIS — M109 Gout, unspecified: Secondary | ICD-10-CM | POA: Diagnosis not present

## 2015-02-01 DIAGNOSIS — E785 Hyperlipidemia, unspecified: Secondary | ICD-10-CM | POA: Diagnosis not present

## 2015-02-01 DIAGNOSIS — I110 Hypertensive heart disease with heart failure: Secondary | ICD-10-CM | POA: Diagnosis not present

## 2015-02-01 DIAGNOSIS — I482 Chronic atrial fibrillation, unspecified: Secondary | ICD-10-CM

## 2015-02-01 DIAGNOSIS — R911 Solitary pulmonary nodule: Secondary | ICD-10-CM

## 2015-02-01 DIAGNOSIS — I5022 Chronic systolic (congestive) heart failure: Secondary | ICD-10-CM

## 2015-02-01 NOTE — Progress Notes (Signed)
Patient ID: Casey [name], female   DOB: July 28, 1940, 75 y.o.   MRN: [id number]    Facility:  [facility]      Allergies  Allergen Reactions  . Penicillins Other (See Comments)    Patient states that she was told previously by a doctor to not take this medication but is unsure why.    Chief Complaint  Patient presents with  . Medical Management of Chronic Issues     HPI:  She is a long term resident of this facility being seen for the management of her chronic illnesses. Overall her status is without change. She is unable to participate in the hpi or ros. There are no nursing concerns at this time.    Past Medical History  Diagnosis Date  . Chronic atrial fibrillation ([place])   . Stroke ([place])   . Hypertension   . Hypercholesterolemia   . Diabetes mellitus   . Sinus of Valsalva aneurysm     a. By 2D echo 05/2011.  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  . CHF (congestive heart failure) ([place])   . GERD (gastroesophageal reflux disease)   . Urinary frequency   . Hypopotassemia   . Long term (current) use of anticoagulants   . Unspecified disorder of kidney and ureter   . Unspecified hereditary and idiopathic peripheral neuropathy   . Unspecified late effects of cerebrovascular disease   . Edema   . Peripheral vascular disease, unspecified ([place])   . Unspecified urinary incontinence   . Congestive heart failure, unspecified   . Gout, unspecified   . Abdominal or pelvic swelling, mass, or lump, left upper quadrant   . Vascular dementia, uncomplicated   . Electrolyte and fluid disorders not elsewhere classified   . Hypopotassemia   . Long term (current) use of anticoagulants   . Chronic kidney disease   . Unspecified hereditary and idiopathic peripheral neuropathy   . Unspecified late effects of cerebrovascular disease   . Coronary  atherosclerosis of unspecified type of vessel, native or graft   . Peripheral vascular disease, unspecified ([place])   . Gout, unspecified   . Seizures [facility])     Past Surgical History  Procedure Laterality Date  . Femoral to femoral bypass graft     . Femoral artery exploration  12/06/2011    Procedure: FEMORAL ARTERY EXPLORATION;  Surgeon: Angelia Mould, MD;  Location: [facility] OR;  Service: Vascular;  Laterality: N/A;  Exploration of large pseudoaneurysm left side of fem-fem bypass graft   . Femoral-femoral bypass graft  12/06/2011    Procedure: BYPASS GRAFT FEMORAL-FEMORAL ARTERY;  Surgeon: Angelia Mould, MD;  Location: [facility] OR;  Service: Vascular;  Laterality: Bilateral;  Revision of left to right Femoral-Femoral bypass graft  . False aneurysm repair  12/06/2011    Procedure: REPAIR FALSE ANEURYSM;  Surgeon: Angelia Mould, MD;  Location: [place];  Service: Vascular;  Laterality: Left;  Repair of left femoral Artery pseudoaneurysm  . Femoral-popliteal bypass graft Left 05/2002    lower extremity femoral to below knee w/ non-versed greater saphenous vein  . Femoral-popliteal bypass graft Left 11/2002  . Tubal ligation Bilateral   . Aortogram  09/01/2008    Abd w/ bilateral lower extremity runoff arteriography    VITAL SIGNS BP 148/71 mmHg  Pulse 60  Temp(Src) 98 F (36.7 C)  Resp 18  Ht 5'  6" (1.676 m)  Wt 116 lb (52.617 kg)  BMI 18.73 kg/m2  SpO2 98%  Patient's Medications  New Prescriptions   No medications on file  Previous Medications   ALLOPURINOL (ZYLOPRIM) 100 MG TABLET    Take 100 mg by mouth daily.   AMLODIPINE (NORVASC) 5 MG TABLET    Take 5 mg by mouth daily.   ASPIRIN 81 MG TABLET    Take 81 mg by mouth daily.   ATORVASTATIN (LIPITOR) 10 MG TABLET    Take 10 mg by mouth daily at 6 PM.   DIGOXIN (LANOXIN) 0.125 MG TABLET    Take 0.125 mg by mouth daily.   DONEPEZIL (ARICEPT) 10 MG TABLET    Take 1 tablet (10 mg total) by mouth at bedtime.   ENALAPRIL  (VASOTEC) 2.5 MG TABLET    Take 1 tablet (2.5 mg total) by mouth daily.   FUROSEMIDE (LASIX) 20 MG TABLET    Take 20 mg by mouth daily.   INSULIN GLARGINE (LANTUS) 100 UNIT/ML INJECTION    Inject 12 Units into the skin at bedtime.   LEVETIRACETAM (KEPPRA) 250 MG TABLET    Take 250 mg by mouth 2 (two) times daily.   METFORMIN (GLUCOPHAGE) 500 MG TABLET    Take 500 mg by mouth 2 (two) times daily with a meal.   METOPROLOL (LOPRESSOR) 50 MG TABLET    Take 50 mg by mouth 2 (two) times daily.   OMEPRAZOLE (PRILOSEC) 20 MG CAPSULE    Take 20 mg by mouth daily.   OXYBUTYNIN (DITROPAN) 5 MG TABLET    Take 1 tablet (5 mg total) by mouth 2 (two) times daily.   POTASSIUM CHLORIDE (K-DUR,KLOR-CON) 10 MEQ TABLET    Take 10 mEq by mouth daily.   SERTRALINE (ZOLOFT) 50 MG TABLET    Take 50 mg by mouth daily.   TRAMADOL (ULTRAM) 50 MG TABLET    Take 50 mg by mouth every 8 (eight) hours as needed.  Modified Medications   No medications on file  Discontinued Medications   No medications on file     SIGNIFICANT DIAGNOSTIC EXAMS   05-21-14: ct of chest: 1. Right upper lobe 5 mm nodule unchanged. 2. Additional nodules identified, some of which are noncalcified andvalso warrants follow-up. 3. Persistent mediastinal and hilar adenopathy. This may bevlong-standing given the previous reports of adenopathy. At the [facility] next exam, consider contrast-enhanced exam for further evaluation of the lymph nodes. 4. Given patient's history of smoking, follow-up chest CT at 6-12vmonths is recommended. 5. Coronary artery disease. . 01-10-15: ct of chest; 1. No acute findings. 2. Stable lung nodules as detailed above, the largest in the right upper lobe measuring 5 mm. Recommend additional unenhanced chest CT follow-up and 18 months per Fleischner criteria for following pulmonary nodules.      LABS REVIEWED:   05-19-14: wbc 6.9; hgb 13.3; hct 41.6; mcv 86.5; plt 210; glucose 129; bun 21; creat 1.04; k+4.4; na++140;  liver normal albumin 4.0; chol 144; ldl 57; trig 130; hdl 52; hgb a1c 5.8; dig 1.5  09-10-14: hgb a1c 6.3; urine micro-albumin <1.2 10-15-14: wbc 5.6; hgb 12.5; hct 41.8; mcv 89.9; plt 209; glucose 88; bun 17.6; creat 0.98; k+ 4.8; na++144; liver normal albumin 4.0; ca 10.3; chol 152; ldl 75; trig 159; hdl 45  11-11-14: dig 1.01 10-15-14: wbc 5.6 ;hgb 12.5; hct 41.8; mcv 89.9; plt 209; glucose 88; bun 17.6; creat 0.98; k+ 4.8; na++144; liver normal albumin 4.0; chol 152; ;ldl 75;  trig 159; hdl 45  12-31-14: hgb a1c 6.1      Review of Systems Unable to perform ROS: Dementia      Physical Exam Constitutional: No distress.  Thin   Neck: Neck supple. No JVD present. No thyromegaly present.  Cardiovascular: Normal rate, regular rhythm and intact distal pulses.   Respiratory: Effort normal and breath sounds normal. No respiratory distress.  GI: Soft. Bowel sounds are normal. She exhibits no distension. There is no tenderness.  Musculoskeletal: She exhibits no edema.  Is able to move left extremities Has right hemiparesis    Neurological: She is alert.  Skin: Skin is warm and dry. She is not diaphoretic.     ASSESSMENT/ PLAN:  1. Gout no recent flares will continue allopurinol 100 mg daily   2. Hypertension: will continue  vasotec 2.5 mg daily lopressor 50 mg twice daily will continue norvasc  5 mg daily   3. Afib: heart rate regular will continue  Asa 81 mg daily with digoxin 125 mcg daily dig level is 1.01; lopressor 50 mg twice daily for rate control.   4. Dementia: is without change will continue aricept 10 mg daily; will monitor  5. Diabetes: will continue lantus 12 units nightly and metformin 500 mg twice daily hgb a1c is 6.3   6. Chronic systolic heart failure:  Ef is 35-40%; is stable will continue lasix 20 mg daily with k+ 10 meq daily   7. Dyslipidemia: will continue lipitor 10 mg daily ldl is 75; trig 159   8. Seizures: no reports of recent activity present; will  continue keppra 250 mg twice daily  9. UI: will continue ditropan 5 mg twice daily   10. Depression: will continue zoloft 50 mg daily   11. Pulmonary nodule: will continue ct scans every 18  months and will monitor her status.   Her ct scan was done Dec 2016 and stable.   12. CVA: is neurologically stable; has right hemiparesis; will continue asa 81 mg daily           Ok Edwards NP [facility]  Contact [phone] Monday through Friday 8am- 5pm  After hours call [phone]

## 2015-02-02 ENCOUNTER — Encounter: Payer: Self-pay | Admitting: Internal Medicine

## 2015-03-08 ENCOUNTER — Non-Acute Institutional Stay (SKILLED_NURSING_FACILITY): Payer: Medicare Other | Admitting: Adult Health

## 2015-03-08 DIAGNOSIS — E785 Hyperlipidemia, unspecified: Secondary | ICD-10-CM

## 2015-03-08 DIAGNOSIS — I482 Chronic atrial fibrillation, unspecified: Secondary | ICD-10-CM

## 2015-03-08 DIAGNOSIS — E1169 Type 2 diabetes mellitus with other specified complication: Secondary | ICD-10-CM | POA: Diagnosis not present

## 2015-03-08 DIAGNOSIS — N183 Chronic kidney disease, stage 3 unspecified: Secondary | ICD-10-CM

## 2015-03-08 DIAGNOSIS — E1140 Type 2 diabetes mellitus with diabetic neuropathy, unspecified: Secondary | ICD-10-CM | POA: Diagnosis not present

## 2015-03-08 DIAGNOSIS — I5022 Chronic systolic (congestive) heart failure: Secondary | ICD-10-CM | POA: Diagnosis not present

## 2015-03-08 DIAGNOSIS — I110 Hypertensive heart disease with heart failure: Secondary | ICD-10-CM | POA: Diagnosis not present

## 2015-03-08 DIAGNOSIS — F0390 Unspecified dementia without behavioral disturbance: Secondary | ICD-10-CM

## 2015-03-08 DIAGNOSIS — E1122 Type 2 diabetes mellitus with diabetic chronic kidney disease: Secondary | ICD-10-CM

## 2015-03-08 DIAGNOSIS — Z794 Long term (current) use of insulin: Secondary | ICD-10-CM

## 2015-03-08 DIAGNOSIS — I70213 Atherosclerosis of native arteries of extremities with intermittent claudication, bilateral legs: Secondary | ICD-10-CM | POA: Diagnosis not present

## 2015-03-31 ENCOUNTER — Non-Acute Institutional Stay (SKILLED_NURSING_FACILITY): Payer: Medicare Other | Admitting: Internal Medicine

## 2015-03-31 ENCOUNTER — Encounter: Payer: Self-pay | Admitting: Internal Medicine

## 2015-03-31 DIAGNOSIS — I5022 Chronic systolic (congestive) heart failure: Secondary | ICD-10-CM

## 2015-03-31 DIAGNOSIS — M109 Gout, unspecified: Secondary | ICD-10-CM

## 2015-03-31 DIAGNOSIS — R569 Unspecified convulsions: Secondary | ICD-10-CM | POA: Diagnosis not present

## 2015-03-31 NOTE — Progress Notes (Signed)
MRN: [id number] Name: Casey [name]  Sex: female Age: 75 y.o. DOB: [birth year]  [place] #: Karren Burly Facility/Room: Level Of Care: SNF Provider: Inocencio Homes D Emergency Contacts: Extended Emergency Contact Information Primary Emergency Contact: Judd Lien of [place] Phone: [phone] Relation: Daughter Secondary Emergency Contact: Alfonzo Beers, [place] Phone: [phone] Mobile Phone: [phone] Relation: Niece  Code Status:   Allergies: Penicillins  Chief Complaint  Patient presents with  . Medical Management of Chronic Issues    HPI: Patient is 75 y.o. female who is being seen for routine issues of gout, seizures, and CHF.  Past Medical History  Diagnosis Date  . Chronic atrial fibrillation ([place])   . Stroke ([place])   . Hypertension   . Hypercholesterolemia   . Diabetes mellitus   . Sinus of Valsalva aneurysm     a. By 2D echo 05/2011.  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  . CHF (congestive heart failure) ([place])   . GERD (gastroesophageal reflux disease)   . Urinary frequency   . Hypopotassemia   . Long term (current) use of anticoagulants   . Unspecified disorder of kidney and ureter   . Unspecified hereditary and idiopathic peripheral neuropathy   . Unspecified late effects of cerebrovascular disease   . Edema   . Peripheral vascular disease, unspecified ([place])   . Unspecified urinary incontinence   . Congestive heart failure, unspecified   . Gout, unspecified   . Abdominal or pelvic swelling, mass, or lump, left upper quadrant   . Vascular dementia, uncomplicated   . Electrolyte and fluid disorders not elsewhere classified   . Hypopotassemia   . Long term (current) use of anticoagulants   . Chronic kidney disease   . Unspecified hereditary and  idiopathic peripheral neuropathy   . Unspecified late effects of cerebrovascular disease   . Coronary atherosclerosis of unspecified type of vessel, native or graft   . Peripheral vascular disease, unspecified ([place])   . Gout, unspecified   . Seizures [facility])     Past Surgical History  Procedure Laterality Date  . Femoral to femoral bypass graft     . Femoral artery exploration  12/06/2011    Procedure: FEMORAL ARTERY EXPLORATION;  Surgeon: Angelia Mould, MD;  Location: [facility] OR;  Service: Vascular;  Laterality: N/A;  Exploration of large pseudoaneurysm left side of fem-fem bypass graft   . Femoral-femoral bypass graft  12/06/2011    Procedure: BYPASS GRAFT FEMORAL-FEMORAL ARTERY;  Surgeon: Angelia Mould, MD;  Location: [facility] OR;  Service: Vascular;  Laterality: Bilateral;  Revision of left to right Femoral-Femoral bypass graft  . False aneurysm repair  12/06/2011    Procedure: REPAIR FALSE ANEURYSM;  Surgeon: Angelia Mould, MD;  Location: [place];  Service: Vascular;  Laterality: Left;  Repair of left femoral Artery pseudoaneurysm  . Femoral-popliteal bypass graft Left 05/2002    lower extremity femoral to below knee w/ non-versed greater saphenous vein  . Femoral-popliteal bypass graft Left 11/2002  . Tubal ligation Bilateral   . Aortogram  09/01/2008    Abd w/ bilateral lower extremity runoff arteriography      Medication List       This list is accurate as of: 03/31/15 11:59 PM.  Always use your most recent med list.  allopurinol 100 MG tablet  Commonly known as:  ZYLOPRIM  Take 100 mg by mouth daily.     amLODipine 5 MG tablet  Commonly known as:  NORVASC  Take 5 mg by mouth daily.     aspirin 81 MG tablet  Take 81 mg by mouth daily.     atorvastatin 10 MG tablet  Commonly known as:  LIPITOR  Take 10 mg by mouth daily at 6 PM.     digoxin 0.125 MG tablet  Commonly known as:  LANOXIN  Take 0.125 mg by mouth daily.     donepezil 10 MG tablet   Commonly known as:  ARICEPT  Take 1 tablet (10 mg total) by mouth at bedtime.     enalapril 2.5 MG tablet  Commonly known as:  VASOTEC  Take 1 tablet (2.5 mg total) by mouth daily.     furosemide 20 MG tablet  Commonly known as:  LASIX  Take 20 mg by mouth daily.     insulin glargine 100 UNIT/ML injection  Commonly known as:  LANTUS  Inject 12 Units into the skin at bedtime.     levETIRAcetam 250 MG tablet  Commonly known as:  KEPPRA  Take 250 mg by mouth 2 (two) times daily.     metFORMIN 500 MG tablet  Commonly known as:  GLUCOPHAGE  Take 500 mg by mouth 2 (two) times daily with a meal.     metoprolol 50 MG tablet  Commonly known as:  LOPRESSOR  Take 50 mg by mouth 2 (two) times daily.     omeprazole 20 MG capsule  Commonly known as:  PRILOSEC  Take 20 mg by mouth daily.     oxybutynin 5 MG tablet  Commonly known as:  DITROPAN  Take 1 tablet (5 mg total) by mouth 2 (two) times daily.     potassium chloride 10 MEQ tablet  Commonly known as:  K-DUR,KLOR-CON  Take 10 mEq by mouth daily.     sertraline 50 MG tablet  Commonly known as:  ZOLOFT  Take 50 mg by mouth daily.     traMADol 50 MG tablet  Commonly known as:  ULTRAM  Take 50 mg by mouth every 8 (eight) hours as needed.        No orders of the defined types were placed in this encounter.    Immunization History  Administered Date(s) Administered  . Influenza Split 12/09/2011  . Influenza-Unspecified 11/27/2013  . Pneumococcal Polysaccharide-23 02/05/2013    Social History  Substance Use Topics  . Smoking status: Former Smoker    Types: Cigarettes    Quit date: 01/29/2001  . Smokeless tobacco: Never Used  . Alcohol Use: No    Review of Systems  UTO 2/2 dementia   Filed Vitals:   03/31/15 1335  BP: 130/73  Pulse: 62  Temp: 97.2 F (36.2 C)  Resp: 18    Physical Exam  GENERAL APPEARANCE: Alert,  No acute distress  SKIN: No diaphoresis rash HEENT: Unremarkable RESPIRATORY:  Breathing is even, unlabored. Lung sounds are clear   CARDIOVASCULAR: Heart irreg no murmurs, rubs or gallops. No peripheral edema  GASTROINTESTINAL: Abdomen is soft, non-tender, not distended w/ normal bowel sounds.  GENITOURINARY: Bladder non tender, not distended  MUSCULOSKELETAL: No abnormal joints or musculature NEUROLOGIC: Cranial nerves 2-12 grossly intact; R hemiparesis PSYCHIATRIC: dementia, no behavioral issues  Patient Active Problem List   Diagnosis Date Noted  . Hypertensive heart disease with congestive heart failure ([place]) 12/30/2014  .  Dyslipidemia associated with type 2 diabetes mellitus ([place]) 12/30/2014  . CKD stage 3 due to type 2 diabetes mellitus ([place]) 12/06/2014  . Absence of bladder continence 03/09/2014  . 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 Chronic systolic congestive heart failure ([place]) 02/09/2013  . Solitary pulmonary nodule 02/09/2013  . Seizures ([place])   . Vision disturbance following CVA (cerebrovascular accident) 02/02/2013  . PVD (peripheral vascular disease) ([place]) 09/30/2012  . Gout 05/06/2012  . Dementia without behavioral disturbance 05/06/2012  . 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 Type 2 diabetes mellitus with diabetic neuropathy ([place]) 12/29/2010  . Chronic atrial fibrillation ([place])   . Late effects of CVA (cerebrovascular accident)     CBC    Component Value Date/Time   WBC 6.9 05/19/2014   WBC 6.2 02/04/2013 0535   RBC 5.12* 02/04/2013 0535   HGB 13.3 05/19/2014   HCT 42 05/19/2014   PLT 210 05/19/2014   MCV 84.8 02/04/2013 0535   LYMPHSABS 1.8 02/02/2013 1845   MONOABS 0.6 02/02/2013 1845   EOSABS 0.3 02/02/2013 1845   BASOSABS 0.1 02/02/2013 1845    CMP     Component Value Date/Time   NA 140 05/19/2014   NA 138 02/04/2013 0535   K 4.4 05/19/2014   CL 101 02/04/2013 0535   CO2 21 02/04/2013 0535   GLUCOSE 152* 02/04/2013 0535   GLUCOSE 221* 05/06/2012 1140   BUN 17  02/04/2013 0535   BUN 24 05/06/2012 1140   CREATININE 1.0 05/19/2014   CREATININE 1.00 02/04/2013 0535   CALCIUM 10.1 02/04/2013 0535   PROT 7.9 02/02/2013 1845   PROT 7.6 04/28/2012 1117   ALBUMIN 3.7 02/02/2013 1845   ALBUMIN 4.3 04/28/2012 1117   AST 20 05/19/2014   ALT 22 05/19/2014   ALKPHOS 93 02/02/2013 1845   BILITOT 0.4 02/02/2013 1845   GFRNONAA 55* 02/04/2013 0535   GFRAA 64* 02/04/2013 0535    Assessment and Plan  Gout no recent flares will continue allopurinol 100 mg daily   Seizures No known recent seizures;plan - cont keppra 250 mg BID  Chronic systolic congestive heart failure Ef is 35-40%; is stable will continue lasix 20 mg daily with k+ 10 meq daily     Hennie Duos, MD

## 2015-04-03 ENCOUNTER — Encounter: Payer: Self-pay | Admitting: Internal Medicine

## 2015-04-03 NOTE — Assessment & Plan Note (Signed)
Ef is 35-40%; is stable will continue lasix 20 mg daily with k+ 10 meq daily

## 2015-04-03 NOTE — Assessment & Plan Note (Signed)
No known recent seizures;plan - cont keppra 250 mg BID

## 2015-04-03 NOTE — Assessment & Plan Note (Signed)
no recent flares will continue allopurinol 100 mg daily

## 2015-04-19 NOTE — Progress Notes (Signed)
Patient ID: Casey [name], female   DOB: [birth year], 75 y.o.   MRN: [id number]   Facility:  [facility]       Allergies  Allergen Reactions  . Penicillins Other (See Comments)    Patient states that she was told previously by a doctor to not take this medication but is unsure why.     Chief Complaint  Patient presents with  . Annual Exam    HPI:  She is a long term resident of this facility being seen for her annual exam. She has remained stable over the past year; and has not required any hospitalizations. She is unable to fully participate in the hpi or ros. There are no nursing concerns at this time.    Past Medical History  Diagnosis Date  . Chronic atrial fibrillation ([place])   . Stroke ([place])   . Hypertension   . Hypercholesterolemia   . Diabetes mellitus   . Sinus of Valsalva aneurysm     a. By 2D echo 05/2011.  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  . CHF (congestive heart failure) ([place])   . GERD (gastroesophageal reflux disease)   . Urinary frequency   . Hypopotassemia   . Long term (current) use of anticoagulants   . Unspecified disorder of kidney and ureter   . Unspecified hereditary and idiopathic peripheral neuropathy   . Unspecified late effects of cerebrovascular disease   . Edema   . Peripheral vascular disease, unspecified ([place])   . Unspecified urinary incontinence   . Congestive heart failure, unspecified   . Gout, unspecified   . Abdominal or pelvic swelling, mass, or lump, left upper quadrant   . Vascular dementia, uncomplicated   . Electrolyte and fluid disorders not elsewhere classified   . Hypopotassemia   . Long term (current) use of anticoagulants   . Chronic kidney disease   . Unspecified hereditary and idiopathic peripheral neuropathy   . Unspecified late effects of cerebrovascular disease   . Coronary  atherosclerosis of unspecified type of vessel, native or graft   . Peripheral vascular disease, unspecified ([place])   . Gout, unspecified   . Seizures [facility])     Past Surgical History  Procedure Laterality Date  . Femoral to femoral bypass graft     . Femoral artery exploration  12/06/2011    Procedure: FEMORAL ARTERY EXPLORATION;  Surgeon: Angelia Mould, MD;  Location: [facility] OR;  Service: Vascular;  Laterality: N/A;  Exploration of large pseudoaneurysm left side of fem-fem bypass graft   . Femoral-femoral bypass graft  12/06/2011    Procedure: BYPASS GRAFT FEMORAL-FEMORAL ARTERY;  Surgeon: Angelia Mould, MD;  Location: [facility] OR;  Service: Vascular;  Laterality: Bilateral;  Revision of left to right Femoral-Femoral bypass graft  . False aneurysm repair  12/06/2011    Procedure: REPAIR FALSE ANEURYSM;  Surgeon: Angelia Mould, MD;  Location: [facility];  Service: Vascular;  Laterality: Left;  Repair of left femoral Artery pseudoaneurysm  . Femoral-popliteal bypass graft Left 05/2002    lower extremity femoral to below knee w/ non-versed greater saphenous vein  . Femoral-popliteal bypass graft Left 11/2002  . Tubal ligation Bilateral   . Aortogram  09/01/2008    Abd w/ bilateral lower extremity runoff arteriography    Family History  Problem Relation Age of Onset  . Cancer Mother     Cervical  .  Stroke Mother   . Diabetes Mother   . Stroke Father   . Heart disease Father     Social History   Social History  . Marital Status: Widowed    Spouse Name: N/A  . Number of Children: N/A  . Years of Education: N/A   Occupational History  . Not on file.   Social History Main Topics  . Smoking status: Former Smoker    Types: Cigarettes    Quit date: 01/29/2001  . Smokeless tobacco: Never Used  . Alcohol Use: No  . Drug Use: No  . Sexual Activity: No   Other Topics Concern  . Not on file   Social History Narrative     VITAL SIGNS BP 141/86 mmHg  Pulse 70  Ht 5\' 6"   (1.676 m)  Wt 116 lb (52.617 kg)  BMI 18.73 kg/m2  SpO2 98%  Patient's Medications  New Prescriptions   No medications on file  Previous Medications   ALLOPURINOL (ZYLOPRIM) 100 MG TABLET    Take 100 mg by mouth daily.   AMLODIPINE (NORVASC) 5 MG TABLET    Take 5 mg by mouth daily.   ASPIRIN 81 MG TABLET    Take 81 mg by mouth daily.   ATORVASTATIN (LIPITOR) 10 MG TABLET    Take 10 mg by mouth daily at 6 PM.   DIGOXIN (LANOXIN) 0.125 MG TABLET    Take 0.125 mg by mouth daily.   DONEPEZIL (ARICEPT) 10 MG TABLET    Take 1 tablet (10 mg total) by mouth at bedtime.   ENALAPRIL (VASOTEC) 2.5 MG TABLET    Take 1 tablet (2.5 mg total) by mouth daily.   FUROSEMIDE (LASIX) 20 MG TABLET    Take 20 mg by mouth daily.   INSULIN GLARGINE (LANTUS) 100 UNIT/ML INJECTION    Inject 12 Units into the skin at bedtime.   LEVETIRACETAM (KEPPRA) 250 MG TABLET    Take 250 mg by mouth 2 (two) times daily.   METFORMIN (GLUCOPHAGE) 500 MG TABLET    Take 500 mg by mouth 2 (two) times daily with a meal.   METOPROLOL (LOPRESSOR) 50 MG TABLET    Take 50 mg by mouth 2 (two) times daily.   OMEPRAZOLE (PRILOSEC) 20 MG CAPSULE    Take 20 mg by mouth daily.   OXYBUTYNIN (DITROPAN) 5 MG TABLET    Take 1 tablet (5 mg total) by mouth 2 (two) times daily.   POTASSIUM CHLORIDE (K-DUR,KLOR-CON) 10 MEQ TABLET    Take 10 mEq by mouth daily.   SERTRALINE (ZOLOFT) 50 MG TABLET    Take 50 mg by mouth daily.   TRAMADOL (ULTRAM) 50 MG TABLET    Take 50 mg by mouth every 8 (eight) hours as needed.  Modified Medications   No medications on file  Discontinued Medications   No medications on file     SIGNIFICANT DIAGNOSTIC EXAMS   05-21-14: ct of chest: 1. Right upper lobe 5 mm nodule unchanged. 2. Additional nodules identified, some of which are noncalcified andvalso warrants follow-up. 3. Persistent mediastinal and hilar adenopathy. This may bevlong-standing given the previous reports of adenopathy. At the [facility] next exam,  consider contrast-enhanced exam for further evaluation of the lymph nodes. 4. Given patient's history of smoking, follow-up chest CT at 6-12vmonths is recommended. 5. Coronary artery disease. . 01-10-15: ct of chest; 1. No acute findings. 2. Stable lung nodules as detailed above, the largest in the right upper lobe measuring 5 mm. Recommend  additional unenhanced chest CT follow-up and 18 months per Fleischner criteria for following pulmonary nodules.      LABS REVIEWED:   05-19-14: wbc 6.9; hgb 13.3; hct 41.6; mcv 86.5; plt 210; glucose 129; bun 21; creat 1.04; k+4.4; na++140; liver normal albumin 4.0; chol 144; ldl 57; trig 130; hdl 52; hgb a1c 5.8; dig 1.5  09-10-14: hgb a1c 6.3; urine micro-albumin <1.2 10-15-14: wbc 5.6; hgb 12.5; hct 41.8; mcv 89.9; plt 209; glucose 88; bun 17.6; creat 0.98; k+ 4.8; na++144; liver normal albumin 4.0; ca 10.3; chol 152; ldl 75; trig 159; hdl 45  11-11-14: dig 1.01 10-15-14: wbc 5.6 ;hgb 12.5; hct 41.8; mcv 89.9; plt 209; glucose 88; bun 17.6; creat 0.98; k+ 4.8; na++144; liver normal albumin 4.0; chol 152; ;ldl 75; trig 159; hdl 45  12-31-14: hgb a1c 6.1      Review of Systems Unable to perform ROS: Dementia      Physical Exam Constitutional: No distress.  Thin   Neck: Neck supple. No JVD present. No thyromegaly present.  Cardiovascular: Normal rate, regular rhythm and intact distal pulses.   Respiratory: Effort normal and breath sounds normal. No respiratory distress.  GI: Soft. Bowel sounds are normal. She exhibits no distension. There is no tenderness.  Musculoskeletal: She exhibits no edema.  Is able to move left extremities Has right hemiparesis    Neurological: She is alert.  Skin: Skin is warm and dry. She is not diaphoretic.     ASSESSMENT/ PLAN:  1. Gout no recent flares will continue allopurinol 100 mg daily   2. Hypertension: will increase vasotec to 5  mg daily will continue  lopressor 50 mg twice daily will continue norvasc   5 mg daily   3. Afib: heart rate regular will continue  Asa 81 mg daily with digoxin 125 mcg daily dig level is 1.01; lopressor 50 mg twice daily for rate control.   4. Dementia: is without change will continue aricept 10 mg daily; will monitor her weight is stable at 116 pounds   5. Diabetes: will continue lantus 12 units nightly and metformin 500 mg twice daily hgb a1c is 6.3   6. Chronic systolic heart failure:  Ef is 35-40%; is stable will continue lasix 20 mg daily with k+ 10 meq daily   7. Dyslipidemia: will continue lipitor 10 mg daily ldl is 75; trig 159   8. Seizures: no reports of recent activity present; will continue keppra 250 mg twice daily  9. UI: will continue ditropan 5 mg twice daily   10. Depression: will continue zoloft 50 mg daily   11. Pulmonary nodule: will continue ct scans every 18  months and will monitor her status.   Her ct scan was done Dec 2016 and stable.   12. CVA: is neurologically stable; has right hemiparesis; will continue asa 81 mg daily    Will check bmp in 2 weeks Will guaiac stool     Time spent with patient   45  minutes >50% time spent counseling; reviewing medical record; tests; labs; and developing future plan of care      Ok Edwards NP [facility] Adult [facility]  Contact [phone] Monday through Friday 8am- 5pm  After hours call [phone]

## 2015-05-04 ENCOUNTER — Encounter: Payer: Self-pay | Admitting: Adult Health

## 2015-05-04 ENCOUNTER — Non-Acute Institutional Stay (SKILLED_NURSING_FACILITY): Payer: Medicare Other | Admitting: Adult Health

## 2015-05-04 DIAGNOSIS — I6990 Unspecified sequelae of unspecified cerebrovascular disease: Secondary | ICD-10-CM

## 2015-05-04 DIAGNOSIS — Z794 Long term (current) use of insulin: Secondary | ICD-10-CM | POA: Diagnosis not present

## 2015-05-04 DIAGNOSIS — E1140 Type 2 diabetes mellitus with diabetic neuropathy, unspecified: Secondary | ICD-10-CM | POA: Diagnosis not present

## 2015-05-04 DIAGNOSIS — R32 Unspecified urinary incontinence: Secondary | ICD-10-CM | POA: Diagnosis not present

## 2015-05-04 DIAGNOSIS — I482 Chronic atrial fibrillation, unspecified: Secondary | ICD-10-CM

## 2015-05-04 DIAGNOSIS — F0390 Unspecified dementia without behavioral disturbance: Secondary | ICD-10-CM

## 2015-05-04 DIAGNOSIS — I5022 Chronic systolic (congestive) heart failure: Secondary | ICD-10-CM | POA: Diagnosis not present

## 2015-05-04 DIAGNOSIS — I110 Hypertensive heart disease with heart failure: Secondary | ICD-10-CM

## 2015-05-04 DIAGNOSIS — E785 Hyperlipidemia, unspecified: Secondary | ICD-10-CM

## 2015-05-04 DIAGNOSIS — E1169 Type 2 diabetes mellitus with other specified complication: Secondary | ICD-10-CM | POA: Diagnosis not present

## 2015-05-04 DIAGNOSIS — M109 Gout, unspecified: Secondary | ICD-10-CM

## 2015-05-04 DIAGNOSIS — R569 Unspecified convulsions: Secondary | ICD-10-CM

## 2015-05-04 NOTE — Progress Notes (Signed)
Patient ID: Casey [name], female   DOB: 24-Oct-1940, 75 y.o.   MRN: [id number]    Facility:  [facility]       Allergies  Allergen Reactions  . Penicillins Other (See Comments)    Patient states that she was told previously by a doctor to not take this medication but is unsure why.     Chief Complaint  Patient presents with  . Medical Management of Chronic Issues    HPI:  She is a long term resident of this facility being seen for the management of her chronic illnesses. Her current weight is 112 pounds; her weight in Oct 2016 was 117 pounds. She was started on remeron 7.5 mg nightly in Feb to help stop the weight loss. She has lost one pound from Feb to April. She is unable to participate in the hpi or ros. She is in the late stages of dementia and has a history of old cva; at the later stages of these disease states weight loss is an expected outcome.  Past Medical History  Diagnosis Date  . Chronic atrial fibrillation ([place])   . Stroke ([place])   . Hypertension   . Hypercholesterolemia   . Diabetes mellitus   . Sinus of Valsalva aneurysm     a. By 2D echo 05/2011.  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  . CHF (congestive heart failure) ([place])   . GERD (gastroesophageal reflux disease)   . Urinary frequency   . Hypopotassemia   . Long term (current) use of anticoagulants   . Unspecified disorder of kidney and ureter   . Unspecified hereditary and idiopathic peripheral neuropathy   . Unspecified late effects of cerebrovascular disease   . Edema   . Peripheral vascular disease, unspecified ([place])   . Unspecified urinary incontinence   . Congestive heart failure, unspecified   . Gout, unspecified   . Abdominal or pelvic swelling, mass, or lump, left upper quadrant   . Vascular dementia, uncomplicated   . Electrolyte and fluid disorders not  elsewhere classified   . Hypopotassemia   . Long term (current) use of anticoagulants   . Chronic kidney disease   . Unspecified hereditary and idiopathic peripheral neuropathy   . Unspecified late effects of cerebrovascular disease   . Coronary atherosclerosis of unspecified type of vessel, native or graft   . Peripheral vascular disease, unspecified ([place])   . Gout, unspecified   . Seizures [facility])     Past Surgical History  Procedure Laterality Date  . Femoral to femoral bypass graft     . Femoral artery exploration  12/06/2011    Procedure: FEMORAL ARTERY EXPLORATION;  Surgeon: Angelia Mould, MD;  Location: [facility] OR;  Service: Vascular;  Laterality: N/A;  Exploration of large pseudoaneurysm left side of fem-fem bypass graft   . Femoral-femoral bypass graft  12/06/2011    Procedure: BYPASS GRAFT FEMORAL-FEMORAL ARTERY;  Surgeon: Angelia Mould, MD;  Location: [facility] OR;  Service: Vascular;  Laterality: Bilateral;  Revision of left to right Femoral-Femoral bypass graft  . False aneurysm repair  12/06/2011    Procedure: REPAIR FALSE ANEURYSM;  Surgeon: Angelia Mould, MD;  Location: [facility];  Service: Vascular;  Laterality: Left;  Repair of left femoral Artery pseudoaneurysm  . Femoral-popliteal bypass graft Left 05/2002    lower extremity femoral to below knee w/ non-versed greater  saphenous vein  . Femoral-popliteal bypass graft Left 11/2002  . Tubal ligation Bilateral   . Aortogram  09/01/2008    Abd w/ bilateral lower extremity runoff arteriography    VITAL SIGNS BP 165/93 mmHg  Pulse 74  Temp(Src) 97 F (36.1 C)  Ht 5\' 3"  (1.6 m)  Wt 112 lb (50.803 kg)  BMI 19.84 kg/m2  SpO2 97%  Patient's Medications  New Prescriptions   No medications on file  Previous Medications   ALLOPURINOL (ZYLOPRIM) 100 MG TABLET    Take 100 mg by mouth daily.   AMLODIPINE (NORVASC) 5 MG TABLET    Take 5 mg by mouth daily.   ASPIRIN 81 MG TABLET    Take 81 mg by mouth daily.    ATORVASTATIN (LIPITOR) 10 MG TABLET    Take 10 mg by mouth daily at 6 PM.   DIGOXIN (LANOXIN) 0.125 MG TABLET    Take 0.125 mg by mouth daily.   DONEPEZIL (ARICEPT) 10 MG TABLET    Take 1 tablet (10 mg total) by mouth at bedtime.   ENALAPRIL (VASOTEC) 2.5 MG TABLET    Take 1 tablet (2.5 mg total) by mouth daily.   FUROSEMIDE (LASIX) 20 MG TABLET    Take 20 mg by mouth daily.   INSULIN GLARGINE (LANTUS) 100 UNIT/ML INJECTION    Inject 12 Units into the skin at bedtime.   LEVETIRACETAM (KEPPRA) 250 MG TABLET    Take 250 mg by mouth 2 (two) times daily.   METFORMIN (GLUCOPHAGE) 500 MG TABLET    Take 500 mg by mouth 2 (two) times daily with a meal.   METOPROLOL (LOPRESSOR) 50 MG TABLET    Take 50 mg by mouth 2 (two) times daily.   MIRTAZAPINE (REMERON) 15 MG TABLET    Take 7.5 mg by mouth at bedtime.   OMEPRAZOLE (PRILOSEC) 20 MG CAPSULE    Take 20 mg by mouth daily.   OXYBUTYNIN (DITROPAN) 5 MG TABLET    Take 1 tablet (5 mg total) by mouth 2 (two) times daily.   POTASSIUM CHLORIDE (K-DUR,KLOR-CON) 10 MEQ TABLET    Take 10 mEq by mouth daily.   SERTRALINE (ZOLOFT) 50 MG TABLET    Take 50 mg by mouth daily.   TRAMADOL (ULTRAM) 50 MG TABLET    Take 50 mg by mouth every 8 (eight) hours as needed.  Modified Medications   No medications on file  Discontinued Medications   No medications on file     SIGNIFICANT DIAGNOSTIC EXAMS   05-21-14: ct of chest: 1. Right upper lobe 5 mm nodule unchanged. 2. Additional nodules identified, some of which are noncalcified andvalso warrants follow-up. 3. Persistent mediastinal and hilar adenopathy. This may bevlong-standing given the previous reports of adenopathy. At the [facility] next exam, consider contrast-enhanced exam for further evaluation of the lymph nodes. 4. Given patient's history of smoking, follow-up chest CT at 6-12vmonths is recommended. 5. Coronary artery disease. . 01-10-15: ct of chest; 1. No acute findings. 2. Stable lung nodules as detailed  above, the largest in the right upper lobe measuring 5 mm. Recommend additional unenhanced chest CT follow-up and 18 months per Fleischner criteria for following pulmonary nodules.      LABS REVIEWED:   05-19-14: wbc 6.9; hgb 13.3; hct 41.6; mcv 86.5; plt 210; glucose 129; bun 21; creat 1.04; k+4.4; na++140; liver normal albumin 4.0; chol 144; ldl 57; trig 130; hdl 52; hgb a1c 5.8; dig 1.5  09-10-14: hgb a1c 6.3; urine micro-albumin <  1.2 10-15-14: wbc 5.6; hgb 12.5; hct 41.8; mcv 89.9; plt 209; glucose 88; bun 17.6; creat 0.98; k+ 4.8; na++144; liver normal albumin 4.0; ca 10.3; chol 152; ldl 75; trig 159; hdl 45  11-11-14: dig 1.01 10-15-14: wbc 5.6 ;hgb 12.5; hct 41.8; mcv 89.9; plt 209; glucose 88; bun 17.6; creat 0.98; k+ 4.8; na++144; liver normal albumin 4.0; chol 152; ;ldl 75; trig 159; hdl 45  12-31-14: hgb a1c 6.1      Review of Systems Unable to perform ROS: Dementia      Physical Exam Constitutional: No distress.  Thin   Neck: Neck supple. No JVD present. No thyromegaly present.  Cardiovascular: Normal rate, regular rhythm and intact distal pulses.   Respiratory: Effort normal and breath sounds normal. No respiratory distress.  GI: Soft. Bowel sounds are normal. She exhibits no distension. There is no tenderness.  Musculoskeletal: She exhibits no edema.  Is able to move left extremities Has right hemiparesis    Neurological: She is alert.  Skin: Skin is warm and dry. She is not diaphoretic.     ASSESSMENT/ PLAN:  1. Gout no recent flares will continue allopurinol 100 mg daily   2. Hypertension: will continue  vasotec  5  mg daily will continue  lopressor 50 mg twice daily will increase norvasc to 10 mg daily and will have staff check blood pressure twice daily   3. Afib: heart rate regular will continue  Asa 81 mg daily with digoxin 125 mcg daily dig level is 1.01; lopressor 50 mg twice daily for rate control.   4. Dementia: is without change will continue aricept  10 mg daily; will monitor her weight is stable at 116 pounds   5. Diabetes: will continue lantus 12 units nightly and metformin 500 mg twice daily hgb a1c is 6.3   6. Chronic systolic heart failure:  Ef is 35-40%; is stable will continue lasix 20 mg daily with k+ 10 meq daily   7. Dyslipidemia: will continue lipitor 10 mg daily ldl is 75; trig 159   8. Seizures: no reports of recent activity present; will continue keppra 250 mg twice daily  9. UI: will continue ditropan 5 mg twice daily   10. Depression: will continue zoloft 50 mg daily   11. Pulmonary nodule: will continue ct scans every 18  months and will monitor her status.   Her ct scan was done Dec 2016 and stable.   12. CVA: is neurologically stable; has right hemiparesis; will continue asa 81 mg daily   13. Weight loss: her current weight is 112 pounds; will continue her remeron 7.5 mg nightly at this time; and will continue supplements per facility protocol and will monitor   Will check cbc; cmp; hgb a1c lipids; dig; tsh          Ok Edwards NP [facility] Adult Medicine  Contact [phone] Monday through Friday 8am- 5pm  After hours call [phone]

## 2015-05-05 LAB — BASIC METABOLIC PANEL
BUN: 59 mg/dL — AB (ref 4–21)
Creatinine: 1.6 mg/dL — AB (ref 0.5–1.1)
GLUCOSE: 74 mg/dL
POTASSIUM: 4.5 mmol/L (ref 3.4–5.3)
SODIUM: 142 mmol/L (ref 137–147)

## 2015-05-05 LAB — LIPID PANEL
Cholesterol: 126 mg/dL (ref 0–200)
HDL: 54 mg/dL (ref 35–70)
LDL Cholesterol: 46 mg/dL
TRIGLYCERIDES: 133 mg/dL (ref 40–160)

## 2015-05-05 LAB — CBC AND DIFFERENTIAL
HCT: 37 % (ref 36–46)
HEMOGLOBIN: 12.2 g/dL (ref 12.0–16.0)
Platelets: 202 10*3/uL (ref 150–399)
WBC: 7.2 10^3/mL

## 2015-05-05 LAB — HEMOGLOBIN A1C: HEMOGLOBIN A1C: 5.6

## 2015-05-05 LAB — TSH: TSH: 0.86 u[IU]/mL (ref 0.41–5.90)

## 2015-05-05 LAB — HEPATIC FUNCTION PANEL
ALT: 9 U/L (ref 7–35)
AST: 13 U/L (ref 13–35)
Alkaline Phosphatase: 94 U/L (ref 25–125)

## 2015-05-10 LAB — BASIC METABOLIC PANEL
BUN: 38 mg/dL — AB (ref 4–21)
CREATININE: 1.2 mg/dL — AB (ref 0.5–1.1)
GLUCOSE: 69 mg/dL
Potassium: 4.5 mmol/L (ref 3.4–5.3)
Sodium: 145 mmol/L (ref 137–147)

## 2015-05-30 ENCOUNTER — Non-Acute Institutional Stay (SKILLED_NURSING_FACILITY): Payer: Medicare Other | Admitting: Internal Medicine

## 2015-05-30 ENCOUNTER — Encounter: Payer: Self-pay | Admitting: Internal Medicine

## 2015-05-30 DIAGNOSIS — E1140 Type 2 diabetes mellitus with diabetic neuropathy, unspecified: Secondary | ICD-10-CM

## 2015-05-30 DIAGNOSIS — I6990 Unspecified sequelae of unspecified cerebrovascular disease: Secondary | ICD-10-CM | POA: Diagnosis not present

## 2015-05-30 DIAGNOSIS — Z794 Long term (current) use of insulin: Secondary | ICD-10-CM

## 2015-05-30 DIAGNOSIS — I480 Paroxysmal atrial fibrillation: Secondary | ICD-10-CM | POA: Diagnosis not present

## 2015-05-30 LAB — HM DIABETES FOOT EXAM

## 2015-05-30 NOTE — Progress Notes (Addendum)
MRN: [id number] Name: LYNELL BENZIE  Sex: female Age: 75 y.o. DOB: 11/29/1940  Tiger #: Karren Burly Facility/Room:106 Level Of Care: SNF Provider:Zannie Runkle, Webb Silversmith MD Emergency Contacts: Extended Emergency Contact Information Primary Emergency Contact: Nolon Lennert [place] Phone: [phone] Relation: Daughter Secondary Emergency Contact: Alfonzo Beers, [place] Phone: [phone] Mobile Phone: [phone] Relation: Niece  Code Status:   Allergies: Penicillins  Chief Complaint  Patient presents with  . Medical Management of Chronic Issues    HPI: Patient is 75 y.o. female who is being seen for routine issues of AF, DM2, and s/p CVA.  Past Medical History  Diagnosis Date  . Chronic atrial fibrillation ([place])   . Stroke ([place])   . Hypertension   . Hypercholesterolemia   . Diabetes mellitus   . Sinus of Valsalva aneurysm     a. By 2D echo 05/2011.  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  . CHF (congestive heart failure) ([place])   . GERD (gastroesophageal reflux disease)   . Urinary frequency   . Hypopotassemia   . Long term (current) use of anticoagulants   . Unspecified disorder of kidney and ureter   . Unspecified hereditary and idiopathic peripheral neuropathy   . Unspecified late effects of cerebrovascular disease   . Edema   . Peripheral vascular disease, unspecified ([place])   . Unspecified urinary incontinence   . Congestive heart failure, unspecified   . Gout, unspecified   . Abdominal or pelvic swelling, mass, or lump, left upper quadrant   . Vascular dementia, uncomplicated   . Electrolyte and fluid disorders not elsewhere classified   . Hypopotassemia   . Long term (current) use of anticoagulants   . Chronic kidney disease   . Unspecified hereditary and  idiopathic peripheral neuropathy   . Unspecified late effects of cerebrovascular disease   . Coronary atherosclerosis of unspecified type of vessel, native or graft   . Peripheral vascular disease, unspecified ([place])   . Gout, unspecified   . Seizures [facility])     Past Surgical History  Procedure Laterality Date  . Femoral to femoral bypass graft     . Femoral artery exploration  12/06/2011    Procedure: FEMORAL ARTERY EXPLORATION;  Surgeon: Angelia Mould, MD;  Location: [facility];  Service: Vascular;  Laterality: N/A;  Exploration of large pseudoaneurysm left side of fem-fem bypass graft   . Femoral-femoral bypass graft  12/06/2011    Procedure: BYPASS GRAFT FEMORAL-FEMORAL ARTERY;  Surgeon: Angelia Mould, MD;  Location: [facility] OR;  Service: Vascular;  Laterality: Bilateral;  Revision of left to right Femoral-Femoral bypass graft  . False aneurysm repair  12/06/2011    Procedure: REPAIR FALSE ANEURYSM;  Surgeon: Angelia Mould, MD;  Location: [place];  Service: Vascular;  Laterality: Left;  Repair of left femoral Artery pseudoaneurysm  . Femoral-popliteal bypass graft Left 05/2002    lower extremity femoral to below knee w/ non-versed greater saphenous vein  . Femoral-popliteal bypass graft Left 11/2002  . Tubal ligation Bilateral   . Aortogram  09/01/2008    Abd w/ bilateral lower extremity runoff arteriography      Medication List       This list is accurate as of: 05/30/15 11:59 PM.  Always use your most recent med list.  allopurinol 100 MG tablet  Commonly known as:  ZYLOPRIM  Take 100 mg by mouth daily.     amLODipine 5 MG tablet  Commonly known as:  NORVASC  Take 5 mg by mouth daily.     aspirin 81 MG tablet  Take 81 mg by mouth daily.     atorvastatin 10 MG tablet  Commonly known as:  LIPITOR  Take 10 mg by mouth daily at 6 PM.     digoxin 0.125 MG tablet  Commonly known as:  LANOXIN  Take 0.125 mg by mouth daily.     donepezil 10 MG tablet   Commonly known as:  ARICEPT  Take 1 tablet (10 mg total) by mouth at bedtime.     enalapril 2.5 MG tablet  Commonly known as:  VASOTEC  Take 1 tablet (2.5 mg total) by mouth daily.     furosemide 20 MG tablet  Commonly known as:  LASIX  Take 20 mg by mouth daily.     insulin glargine 100 UNIT/ML injection  Commonly known as:  LANTUS  Inject 12 Units into the skin at bedtime.     levETIRAcetam 250 MG tablet  Commonly known as:  KEPPRA  Take 250 mg by mouth 2 (two) times daily.     metFORMIN 500 MG tablet  Commonly known as:  GLUCOPHAGE  Take 500 mg by mouth 2 (two) times daily with a meal.     metoprolol 50 MG tablet  Commonly known as:  LOPRESSOR  Take 50 mg by mouth 2 (two) times daily.     mirtazapine 15 MG tablet  Commonly known as:  REMERON  Take 7.5 mg by mouth at bedtime.     omeprazole 20 MG capsule  Commonly known as:  PRILOSEC  Take 20 mg by mouth daily.     oxybutynin 5 MG tablet  Commonly known as:  DITROPAN  Take 1 tablet (5 mg total) by mouth 2 (two) times daily.     potassium chloride 10 MEQ tablet  Commonly known as:  K-DUR,KLOR-CON  Take 10 mEq by mouth daily.     sertraline 50 MG tablet  Commonly known as:  ZOLOFT  Take 50 mg by mouth daily.     traMADol 50 MG tablet  Commonly known as:  ULTRAM  Take 50 mg by mouth every 8 (eight) hours as needed.        No orders of the defined types were placed in this encounter.    Immunization History  Administered Date(s) Administered  . Influenza Split 12/09/2011  . Influenza-Unspecified 11/27/2013, 03/08/2015  . Pneumococcal Polysaccharide-23 02/05/2013    Social History  Substance Use Topics  . Smoking status: Former Smoker    Types: Cigarettes    Quit date: 01/29/2001  . Smokeless tobacco: Never Used  . Alcohol Use: No    Review of Systems   UTO 2/2 dementia; nursing without concerns    Filed Vitals:   05/30/15 1409  BP: 118/76  Pulse: 62  Temp: 98.6 F (37 C)  Resp: 18     Physical Exam  GENERAL APPEARANCE: Alert, mod conversant, No acute distress  SKIN: No diaphoresis rash HEENT: blind RESPIRATORY: Breathing is even, unlabored. Lung sounds are clear   CARDIOVASCULAR: Heart RRR no murmurs, rubs or gallops. No peripheral edema  GASTROINTESTINAL: Abdomen is soft, non-tender, not distended w/ normal bowel sounds.  GENITOURINARY: Bladder non tender, not distended  MUSCULOSKELETAL: No abnormal joints or musculature NEUROLOGIC: Cranial nerves 2-12 grossly intact;R  hemiparesisi PSYCHIATRIC: Mood and affect appropriate to situation with dementia, no behavioral issues  Patient Active Problem List   Diagnosis Date Noted  . Hypertensive heart disease with congestive heart failure ([place]) 12/30/2014  . Dyslipidemia associated with type 2 diabetes mellitus ([place]) 12/30/2014  . CKD stage 3 due to type 2 diabetes mellitus ([place]) 12/06/2014  . Absence of bladder continence 03/09/2014  . 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 Chronic systolic congestive heart failure ([place]) 02/09/2013  . Solitary pulmonary nodule 02/09/2013  . Seizures ([place])   . Vision disturbance following CVA (cerebrovascular accident) 02/02/2013  . PVD (peripheral vascular disease) ([place]) 09/30/2012  . Gout 05/06/2012  . Dementia without behavioral disturbance 05/06/2012  . Atherosclerosis of native artery of extremity with intermittent claudication ([place]) 02/20/2012  . Type 2 diabetes mellitus with diabetic neuropathy ([place]) 12/29/2010  . Atrial fibrillation ([place])   . Late effects of CVA (cerebrovascular accident)     CBC    Component Value Date/Time   WBC 6.9 05/19/2014   WBC 6.2 02/04/2013 0535   RBC 5.12* 02/04/2013 0535   HGB 13.3 05/19/2014   HCT 42 05/19/2014   PLT 210 05/19/2014   MCV 84.8 02/04/2013 0535   LYMPHSABS 1.8 02/02/2013 1845   MONOABS 0.6 02/02/2013 1845   EOSABS 0.3 02/02/2013 1845   BASOSABS 0.1 02/02/2013 1845    CMP     Component Value Date/Time   NA 140 05/19/2014    NA 138 02/04/2013 0535   K 4.4 05/19/2014   CL 101 02/04/2013 0535   CO2 21 02/04/2013 0535   GLUCOSE 152* 02/04/2013 0535   GLUCOSE 221* 05/06/2012 1140   BUN 17 02/04/2013 0535   BUN 24 05/06/2012 1140   CREATININE 1.0 05/19/2014   CREATININE 1.00 02/04/2013 0535   CALCIUM 10.1 02/04/2013 0535   PROT 7.9 02/02/2013 1845   PROT 7.6 04/28/2012 1117   ALBUMIN 3.7 02/02/2013 1845   ALBUMIN 4.3 04/28/2012 1117   AST 20 05/19/2014   ALT 22 05/19/2014   ALKPHOS 93 02/02/2013 1845   BILITOT 0.4 02/02/2013 1845   GFRNONAA 55* 02/04/2013 0535   GFRAA 64* 02/04/2013 0535    Assessment and Plan  Atrial fibrillation (HCC) heart rate regular will continue Asa 81 mg daily with digoxin 125 mcg daily dig level is 1.01; lopressor 50 mg twice daily for rate control.   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

## 2015-06-05 ENCOUNTER — Encounter: Payer: Self-pay | Admitting: Internal Medicine

## 2015-06-05 NOTE — Assessment & Plan Note (Signed)
is neurologically stable; has right hemiparesis; will continue asa 81 mg daily

## 2015-06-05 NOTE — Assessment & Plan Note (Signed)
will continue lantus 12 units nightly and metformin 500 mg twice daily hgb a1c is 6.3 ; pt is on ACE and statin

## 2015-06-05 NOTE — Assessment & Plan Note (Signed)
heart rate regular will continue Asa 81 mg daily with digoxin 125 mcg daily dig level is 1.01; lopressor 50 mg twice daily for rate control.

## 2015-07-06 ENCOUNTER — Encounter: Payer: Self-pay | Admitting: Adult Health

## 2015-07-06 ENCOUNTER — Non-Acute Institutional Stay (SKILLED_NURSING_FACILITY): Payer: Medicare Other | Admitting: Adult Health

## 2015-07-06 DIAGNOSIS — F0390 Unspecified dementia without behavioral disturbance: Secondary | ICD-10-CM

## 2015-07-06 DIAGNOSIS — I480 Paroxysmal atrial fibrillation: Secondary | ICD-10-CM | POA: Diagnosis not present

## 2015-07-06 DIAGNOSIS — N183 Chronic kidney disease, stage 3 (moderate): Secondary | ICD-10-CM

## 2015-07-06 DIAGNOSIS — E785 Hyperlipidemia, unspecified: Secondary | ICD-10-CM

## 2015-07-06 DIAGNOSIS — I5022 Chronic systolic (congestive) heart failure: Secondary | ICD-10-CM

## 2015-07-06 DIAGNOSIS — E1122 Type 2 diabetes mellitus with diabetic chronic kidney disease: Secondary | ICD-10-CM | POA: Diagnosis not present

## 2015-07-06 DIAGNOSIS — I110 Hypertensive heart disease with heart failure: Secondary | ICD-10-CM | POA: Diagnosis not present

## 2015-07-06 DIAGNOSIS — Z794 Long term (current) use of insulin: Secondary | ICD-10-CM

## 2015-07-06 DIAGNOSIS — E1169 Type 2 diabetes mellitus with other specified complication: Secondary | ICD-10-CM

## 2015-07-06 DIAGNOSIS — E1140 Type 2 diabetes mellitus with diabetic neuropathy, unspecified: Secondary | ICD-10-CM | POA: Diagnosis not present

## 2015-07-06 DIAGNOSIS — I6990 Unspecified sequelae of unspecified cerebrovascular disease: Secondary | ICD-10-CM | POA: Diagnosis not present

## 2015-07-06 DIAGNOSIS — R911 Solitary pulmonary nodule: Secondary | ICD-10-CM | POA: Diagnosis not present

## 2015-07-06 NOTE — Progress Notes (Signed)
Patient ID: Casey [name], female   DOB: 09/04/1940, 75 y.o.   MRN: [id number]   Location:  [facility] Room Number: 106-A Place of Service:  SNF (31)   CODE STATUS: Full Code  Allergies  Allergen Reactions  . Penicillins Other (See Comments)    Patient states that she was told previously by a doctor to not take this medication but is unsure why.     Chief Complaint  Patient presents with  . Medical Management of Chronic Issues    Follow up    HPI:  She is a long term resident of this facility being seen for the management of her chronic illnesses. Overall there is little change in her status. She is unable to fully participate in the hpi or ros. There are no nursing concerns at this time.   Past Medical History  Diagnosis Date  . Chronic atrial fibrillation ([place])   . Stroke ([place])   . Hypertension   . Hypercholesterolemia   . Diabetes mellitus   . Sinus of Valsalva aneurysm     a. By 2D echo 05/2011.  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  . CHF (congestive heart failure) ([place])   . GERD (gastroesophageal reflux disease)   . Urinary frequency   . Hypopotassemia   . Long term (current) use of anticoagulants   . Unspecified disorder of kidney and ureter   . Unspecified hereditary and idiopathic peripheral neuropathy   . Unspecified late effects of cerebrovascular disease   . Edema   . Peripheral vascular disease, unspecified ([place])   . Unspecified urinary incontinence   . Congestive heart failure, unspecified   . Gout, unspecified   . Abdominal or pelvic swelling, mass, or lump, left upper quadrant   . Vascular dementia, uncomplicated   . Electrolyte and fluid disorders not elsewhere classified   . Hypopotassemia   . Long term (current) use of anticoagulants   . Chronic kidney disease   . Unspecified  hereditary and idiopathic peripheral neuropathy   . Unspecified late effects of cerebrovascular disease   . Coronary atherosclerosis of unspecified type of vessel, native or graft   . Peripheral vascular disease, unspecified ([place])   . Gout, unspecified   . Seizures [facility])     Past Surgical History  Procedure Laterality Date  . Femoral to femoral bypass graft     . Femoral artery exploration  12/06/2011    Procedure: FEMORAL ARTERY EXPLORATION;  Surgeon: Angelia Mould, MD;  Location: [facility] OR;  Service: Vascular;  Laterality: N/A;  Exploration of large pseudoaneurysm left side of fem-fem bypass graft   . Femoral-femoral bypass graft  12/06/2011    Procedure: BYPASS GRAFT FEMORAL-FEMORAL ARTERY;  Surgeon: Angelia Mould, MD;  Location: [facility] OR;  Service: Vascular;  Laterality: Bilateral;  Revision of left to right Femoral-Femoral bypass graft  . False aneurysm repair  12/06/2011    Procedure: REPAIR FALSE ANEURYSM;  Surgeon: Angelia Mould, MD;  Location: [place];  Service: Vascular;  Laterality: Left;  Repair of left femoral Artery pseudoaneurysm  . Femoral-popliteal bypass graft Left 05/2002    lower extremity femoral to below knee w/ non-versed greater saphenous vein  . Femoral-popliteal bypass graft Left 11/2002  . Tubal ligation Bilateral   . Aortogram  09/01/2008    Abd w/ bilateral lower extremity runoff arteriography  Social History   Social History  . Marital Status: Widowed    Spouse Name: N/A  . Number of Children: N/A  . Years of Education: N/A   Occupational History  . Not on file.   Social History Main Topics  . Smoking status: Former Smoker    Types: Cigarettes    Quit date: 01/29/2001  . Smokeless tobacco: Never Used  . Alcohol Use: No  . Drug Use: No  . Sexual Activity: No   Other Topics Concern  . Not on file   Social History Narrative   Family History  Problem Relation Age of Onset  . Cancer Mother     Cervical  . Stroke Mother   .  Diabetes Mother   . Stroke Father   . Heart disease Father       VITAL SIGNS BP 127/75 mmHg  Pulse 90  Temp(Src) 98.4 F (36.9 C) (Oral)  Resp 17  Ht 5\' 3"  (1.6 m)  Wt 115 lb (52.164 kg)  BMI 20.38 kg/m2  SpO2 96%  Patient's Medications  New Prescriptions   No medications on file  Previous Medications   ALLOPURINOL (ZYLOPRIM) 100 MG TABLET    Take 100 mg by mouth daily.   AMLODIPINE (NORVASC) 10 MG TABLET    Take 10 mg by mouth daily.   ASPIRIN 81 MG TABLET    Take 81 mg by mouth daily.   ATORVASTATIN (LIPITOR) 10 MG TABLET    Take 10 mg by mouth daily at 6 PM.   DIGOXIN (LANOXIN) 0.125 MG TABLET    Take 0.125 mg by mouth daily. Check apical heart rate prior to each dose. Hold dose if HR is less than 60 and notify MD   DONEPEZIL (ARICEPT) 10 MG TABLET    Take 1 tablet (10 mg total) by mouth at bedtime.   ENALAPRIL (VASOTEC) 2.5 MG TABLET    Take 1 tablet (2.5 mg total) by mouth daily.   INSULIN GLARGINE (LANTUS) 100 UNIT/ML INJECTION    Inject 12 Units into the skin at bedtime.   LEVETIRACETAM (KEPPRA) 250 MG TABLET    Take 250 mg by mouth 2 (two) times daily.   METFORMIN (GLUCOPHAGE) 500 MG TABLET    Take 500 mg by mouth 2 (two) times daily with a meal.   METOPROLOL (LOPRESSOR) 50 MG TABLET    Take 50 mg by mouth 2 (two) times daily. Hold if pulse  < 60   MIRTAZAPINE (REMERON) 7.5 MG TABLET    Take 7.5 mg by mouth at bedtime.   OXYBUTYNIN (DITROPAN) 5 MG TABLET    Take 1 tablet (5 mg total) by mouth 2 (two) times daily.   POTASSIUM CHLORIDE (K-DUR,KLOR-CON) 10 MEQ TABLET    Take 10 mEq by mouth daily.   SERTRALINE (ZOLOFT) 50 MG TABLET    Take 50 mg by mouth daily.   TRAMADOL (ULTRAM) 50 MG TABLET    Take 50 mg by mouth every 8 (eight) hours as needed.   UNABLE TO FIND    Med Name: Med Pass 2 give 90 cc three times daily by mouth. HSG Mech Soft texture  Modified Medications   No medications on file  Discontinued Medications   No medications on file     SIGNIFICANT  DIAGNOSTIC EXAMS  05-21-14: ct of chest: 1. Right upper lobe 5 mm nodule unchanged. 2. Additional nodules identified, some of which are noncalcified andvalso warrants follow-up. 3. Persistent mediastinal and hilar adenopathy. This may bevlong-standing given the  previous reports of adenopathy. At [facility] next exam, consider contrast-enhanced exam for further evaluation of the lymph nodes. 4. Given patient's history of smoking, follow-up chest CT at 6-12vmonths is recommended. 5. Coronary artery disease. . 01-10-15: ct of chest; 1. No acute findings. 2. Stable lung nodules as detailed above, the largest in the right upper lobe measuring 5 mm. Recommend additional unenhanced chest CT follow-up and 18 months per Fleischner criteria for following pulmonary nodules.      LABS REVIEWED:   09-10-14: hgb a1c 6.3; urine micro-albumin <1.2 10-15-14: wbc 5.6; hgb 12.5; hct 41.8; mcv 89.9; plt 209; glucose 88; bun 17.6; creat 0.98; k+ 4.8; na++144; liver normal albumin 4.0; ca 10.3; chol 152; ldl 75; trig 159; hdl 45  11-11-14: dig 1.01 10-15-14: wbc 5.6 ;hgb 12.5; hct 41.8; mcv 89.9; plt 209; glucose 88; bun 17.6; creat 0.98; k+ 4.8; na++144; liver normal albumin 4.0; chol 152; ;ldl 75; trig 159; hdl 45  12-31-14: hgb a1c 6.1  05-05-15: wbc 7.2; hgb 12.2; hct 37.2; mcv 88.5; plt 202; glucose 74; bun 59.2; creat 1.60; k+ 4.5; na++ 142; chol 126; ldl 46; trig 133; hdl 54; dig 1.49; tsh 0.86; hgb a1c 5.6      Review of Systems Unable to perform ROS: Dementia      Physical Exam Constitutional: No distress.  Thin   Neck: Neck supple. No JVD present. No thyromegaly present.  Cardiovascular: Normal rate, regular rhythm and intact distal pulses.   Respiratory: Effort normal and breath sounds normal. No respiratory distress.  GI: Soft. Bowel sounds are normal. She exhibits no distension. There is no tenderness.  Musculoskeletal: She exhibits no edema.  Is able to move left extremities Has right  hemiparesis    Neurological: She is alert.  Skin: Skin is warm and dry. She is not diaphoretic.     ASSESSMENT/ PLAN:  1. Gout no recent flares will continue allopurinol 100 mg daily   2. Hypertension: will continue  vasotec  2.5  mg daily will continue  lopressor 50 mg twice daily norvasc  10 mg daily   3. Afib: heart rate regular will continue  Asa 81 mg daily with digoxin 125 mcg daily dig level is 1.49; lopressor 50 mg twice daily for rate control.   4. Dementia: is without change will continue aricept 10 mg daily; will monitor her weight is stable at 115 pounds   5. Diabetes: will continue lantus 12 units nightly and metformin 500 mg twice daily hgb a1c is 5.6   6. Chronic systolic heart failure:  Ef is 35-40%; is stable will continue lasix 20 mg daily with k+ 10 meq daily   7. Dyslipidemia: will continue lipitor 10 mg daily ldl is 46; trig 133   8. Seizures: no reports of recent activity present; will continue keppra 250 mg twice daily  9. UI: will continue ditropan 5 mg twice daily   10. Depression: will continue zoloft 50 mg daily   11. Pulmonary nodule: will continue ct scans every 18  months and will monitor her status.   Her ct scan was done Dec 2016 and stable.   12. CVA: is neurologically stable; has right hemiparesis; will continue asa 81 mg daily   13. Weight loss: her current weight is 115 pounds; will continue remeron 7.5 mg nightly  will continue supplements per facility protocol and will monitor     Ok Edwards NP [facility]  Contact [phone] Monday through Friday 8am- 5pm  After hours  call [phone]

## 2015-08-12 ENCOUNTER — Encounter: Payer: Self-pay | Admitting: Adult Health

## 2015-08-12 ENCOUNTER — Non-Acute Institutional Stay (SKILLED_NURSING_FACILITY): Payer: Medicare Other | Admitting: Adult Health

## 2015-08-12 DIAGNOSIS — I5022 Chronic systolic (congestive) heart failure: Secondary | ICD-10-CM | POA: Diagnosis not present

## 2015-08-12 DIAGNOSIS — I110 Hypertensive heart disease with heart failure: Secondary | ICD-10-CM | POA: Diagnosis not present

## 2015-08-12 DIAGNOSIS — E785 Hyperlipidemia, unspecified: Secondary | ICD-10-CM | POA: Diagnosis not present

## 2015-08-12 DIAGNOSIS — Z794 Long term (current) use of insulin: Secondary | ICD-10-CM

## 2015-08-12 DIAGNOSIS — R911 Solitary pulmonary nodule: Secondary | ICD-10-CM | POA: Diagnosis not present

## 2015-08-12 DIAGNOSIS — R569 Unspecified convulsions: Secondary | ICD-10-CM | POA: Diagnosis not present

## 2015-08-12 DIAGNOSIS — I6990 Unspecified sequelae of unspecified cerebrovascular disease: Secondary | ICD-10-CM

## 2015-08-12 DIAGNOSIS — E1169 Type 2 diabetes mellitus with other specified complication: Secondary | ICD-10-CM | POA: Diagnosis not present

## 2015-08-12 DIAGNOSIS — E1140 Type 2 diabetes mellitus with diabetic neuropathy, unspecified: Secondary | ICD-10-CM | POA: Diagnosis not present

## 2015-08-12 NOTE — Progress Notes (Signed)
Patient ID: Casey [name], female   DOB: [birth year], 75 y.o.   MRN: [id number]   Location:   [facility] Room Number: 106-A Place of Service:  SNF (31)   CODE STATUS: Full Code  Allergies  Allergen Reactions  . Penicillins Other (See Comments)    Patient states that she was told previously by a doctor to not take this medication but is unsure why.     Chief Complaint  Patient presents with  . Medical Management of Chronic Issues    Follow up    HPI:  She is a long term resident of this facility being seen for the management of her chronic illnesses. She is slowly losing weight; her current weight is 113 pounds. She does have advanced dementia; and weight loss is an unfortunate expected out come of this disease state. She is unable to participate in the hpi or ros; there are no nursing concerns at this time.   Past Medical History  Diagnosis Date  . Chronic atrial fibrillation ([place])   . Stroke ([place])   . Hypertension   . Hypercholesterolemia   . Diabetes mellitus   . Sinus of Valsalva aneurysm     a. By 2D echo 05/2011.  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  . CHF (congestive heart failure) ([place])   . GERD (gastroesophageal reflux disease)   . Urinary frequency   . Hypopotassemia   . Long term (current) use of anticoagulants   . Unspecified disorder of kidney and ureter   . Unspecified hereditary and idiopathic peripheral neuropathy   . Unspecified late effects of cerebrovascular disease   . Edema   . Peripheral vascular disease, unspecified ([place])   . Unspecified urinary incontinence   . Congestive heart failure, unspecified   . Gout, unspecified   . Abdominal or pelvic swelling, mass, or lump, left upper quadrant   . Vascular dementia, uncomplicated   . Electrolyte and fluid disorders not elsewhere classified   .  Hypopotassemia   . Long term (current) use of anticoagulants   . Chronic kidney disease   . Unspecified hereditary and idiopathic peripheral neuropathy   . Unspecified late effects of cerebrovascular disease   . Coronary atherosclerosis of unspecified type of vessel, native or graft   . Peripheral vascular disease, unspecified ([place])   . Gout, unspecified   . Seizures [facility])     Past Surgical History  Procedure Laterality Date  . Femoral to femoral bypass graft     . Femoral artery exploration  12/06/2011    Procedure: FEMORAL ARTERY EXPLORATION;  Surgeon: Angelia Mould, MD;  Location: [facility] OR;  Service: Vascular;  Laterality: N/A;  Exploration of large pseudoaneurysm left side of fem-fem bypass graft   . Femoral-femoral bypass graft  12/06/2011    Procedure: BYPASS GRAFT FEMORAL-FEMORAL ARTERY;  Surgeon: Angelia Mould, MD;  Location: [facility] OR;  Service: Vascular;  Laterality: Bilateral;  Revision of left to right Femoral-Femoral bypass graft  . False aneurysm repair  12/06/2011    Procedure: REPAIR FALSE ANEURYSM;  Surgeon: Angelia Mould, MD;  Location: [place];  Service: Vascular;  Laterality: Left;  Repair of left femoral Artery pseudoaneurysm  . Femoral-popliteal bypass graft Left 05/2002    lower extremity femoral to below knee w/ non-versed greater saphenous vein  . Femoral-popliteal bypass graft Left 11/2002  . Tubal ligation  Bilateral   . Aortogram  09/01/2008    Abd w/ bilateral lower extremity runoff arteriography    Social History   Social History  . Marital Status: Widowed    Spouse Name: N/A  . Number of Children: N/A  . Years of Education: N/A   Occupational History  . Not on file.   Social History Main Topics  . Smoking status: Former Smoker    Types: Cigarettes    Quit date: 01/29/2001  . Smokeless tobacco: Never Used  . Alcohol Use: No  . Drug Use: No  . Sexual Activity: No   Other Topics Concern  . Not on file   Social History Narrative    Family History  Problem Relation Age of Onset  . Cancer Mother     Cervical  . Stroke Mother   . Diabetes Mother   . Stroke Father   . Heart disease Father       VITAL SIGNS BP 117/67 mmHg  Pulse 56  Temp(Src) 97.7 F (36.5 C) (Oral)  Resp 16  Ht 5\' 3"  (1.6 m)  Wt 113 lb 3 oz (51.342 kg)  BMI 20.06 kg/m2  SpO2 96%  Patient's Medications  New Prescriptions   No medications on file  Previous Medications   ALLOPURINOL (ZYLOPRIM) 100 MG TABLET    Take 100 mg by mouth daily.   AMLODIPINE (NORVASC) 10 MG TABLET    Take 10 mg by mouth daily.   ASPIRIN 81 MG TABLET    Take 81 mg by mouth daily.   ATORVASTATIN (LIPITOR) 10 MG TABLET    Take 10 mg by mouth daily at 6 PM.   DIGOXIN (LANOXIN) 0.125 MG TABLET    Take 0.125 mg by mouth daily. Check apical heart rate prior to each dose. Hold dose if HR is less than 60 and notify MD   DONEPEZIL (ARICEPT) 10 MG TABLET    Take 1 tablet (10 mg total) by mouth at bedtime.   ENALAPRIL (VASOTEC) 2.5 MG TABLET    Take 1 tablet (2.5 mg total) by mouth daily.   INSULIN GLARGINE (LANTUS) 100 UNIT/ML INJECTION    Inject 12 Units into the skin at bedtime. Notify MD for CBg < or = 60, > or = 450   LEVETIRACETAM (KEPPRA) 250 MG TABLET    Take 250 mg by mouth 2 (two) times daily.   METFORMIN (GLUCOPHAGE) 500 MG TABLET    Take 500 mg by mouth 2 (two) times daily with a meal.   METOPROLOL (LOPRESSOR) 50 MG TABLET    Take 50 mg by mouth 2 (two) times daily. Hold if pulse  < 60   MIRTAZAPINE (REMERON) 7.5 MG TABLET    Take 7.5 mg by mouth at bedtime.   MULTIPLE VITAMINS-MINERALS (DECUBI-VITE) CAPS    Take 1 capsule by mouth daily.   OXYBUTYNIN (DITROPAN) 5 MG TABLET    Take 1 tablet (5 mg total) by mouth 2 (two) times daily.   POTASSIUM CHLORIDE (K-DUR,KLOR-CON) 10 MEQ TABLET    Take 10 mEq by mouth daily.   SERTRALINE (ZOLOFT) 50 MG TABLET    Take 50 mg by mouth daily.   TRAMADOL (ULTRAM) 50 MG TABLET    Take 50 mg by mouth every 8 (eight) hours as  needed.   UNABLE TO FIND    Med Name: Med Pass 2 give 90 cc three times daily by mouth. HSG Mech Soft texture  Modified Medications   No medications on file  Discontinued Medications  No medications on file     SIGNIFICANT DIAGNOSTIC EXAMS  05-21-14: ct of chest: 1. Right upper lobe 5 mm nodule unchanged. 2. Additional nodules identified, some of which are noncalcified andvalso warrants follow-up. 3. Persistent mediastinal and hilar adenopathy. This may bevlong-standing given the previous reports of adenopathy. At the [facility] next exam, consider contrast-enhanced exam for further evaluation of the lymph nodes. 4. Given patient's history of smoking, follow-up chest CT at 6-12vmonths is recommended. 5. Coronary artery disease. . 01-10-15: ct of chest; 1. No acute findings. 2. Stable lung nodules as detailed above, the largest in the right upper lobe measuring 5 mm. Recommend additional unenhanced chest CT follow-up and 18 months per Fleischner criteria for following pulmonary nodules.      LABS REVIEWED:   09-10-14: hgb a1c 6.3; urine micro-albumin <1.2 10-15-14: wbc 5.6; hgb 12.5; hct 41.8; mcv 89.9; plt 209; glucose 88; bun 17.6; creat 0.98; k+ 4.8; na++144; liver normal albumin 4.0; ca 10.3; chol 152; ldl 75; trig 159; hdl 45  11-11-14: dig 1.01 10-15-14: wbc 5.6 ;hgb 12.5; hct 41.8; mcv 89.9; plt 209; glucose 88; bun 17.6; creat 0.98; k+ 4.8; na++144; liver normal albumin 4.0; chol 152; ;ldl 75; trig 159; hdl 45  12-31-14: hgb a1c 6.1  05-05-15: wbc 7.2; hgb 12.2; hct 37.2; mcv 88.5; plt 202; glucose 74; bun 59.2; creat 1.60; k+ 4.5; na++ 142; chol 126; ldl 46; trig 133; hdl 54; dig 1.49; tsh 0.86; hgb a1c 5.6  05-10-15: glucose 69; bun 37.8; creat 1.24; k+ 4.5; na++ 145     Review of Systems Unable to perform ROS: Dementia      Physical Exam Constitutional: No distress.  Thin   Neck: Neck supple. No JVD present. No thyromegaly present.  Cardiovascular: Normal rate, regular  rhythm and intact distal pulses.   Respiratory: Effort normal and breath sounds normal. No respiratory distress.  GI: Soft. Bowel sounds are normal. She exhibits no distension. There is no tenderness.  Musculoskeletal: She exhibits no edema.  Is able to move left extremities Has right hemiparesis    Neurological: She is alert.  Skin: Skin is warm and dry. She is not diaphoretic.     ASSESSMENT/ PLAN:  1. Gout no recent flares will continue allopurinol 100 mg daily   2. Hypertension: will continue  vasotec  2.5  mg daily will continue  lopressor 50 mg twice daily norvasc  10 mg daily   3. Afib: heart rate regular will continue  Asa 81 mg daily with digoxin 125 mcg daily dig level is 1.49; lopressor 50 mg twice daily for rate control.   4. Dementia: is without change will continue aricept 10 mg daily; will monitor her weight is 113 pounds will need to continue to monitor her weight; if she continues to lose weight will need to consider stopping the aricept.   5. Diabetes: will continue  metformin 500 mg twice daily will stop the lantus and will check cbg daily will monitor hgb a1c is 5.6   6. Chronic systolic heart failure:  Ef is 35-40%; is stable will continue lasix 20 mg daily with k+ 10 meq daily   7. Dyslipidemia: will continue lipitor 10 mg daily ldl is 46; trig 133   8. Seizures: no reports of recent activity present; will continue keppra 250 mg twice daily  9. UI: will continue ditropan 5 mg twice daily   10. Depression: will continue zoloft 50 mg daily   11. Pulmonary nodule: will continue ct scans  every 18  months and will monitor her status.   Her ct scan was done Dec 2016 and stable.   12. CVA: is neurologically stable; has right hemiparesis; will continue asa 81 mg daily   13. Weight loss: her current weight is 113 pounds; will continue remeron 7.5 mg nightly  will continue supplements per facility protocol and will monitor     Will check hgb a1c in August        Deborah Green NP [facility]  Contact [phone] Monday through Friday 8am- 5pm  After hours call [phone]

## 2015-09-13 ENCOUNTER — Non-Acute Institutional Stay (SKILLED_NURSING_FACILITY): Payer: Medicare Other | Admitting: Adult Health

## 2015-09-13 ENCOUNTER — Encounter: Payer: Self-pay | Admitting: Adult Health

## 2015-09-13 DIAGNOSIS — R569 Unspecified convulsions: Secondary | ICD-10-CM

## 2015-09-13 DIAGNOSIS — M109 Gout, unspecified: Secondary | ICD-10-CM

## 2015-09-13 DIAGNOSIS — I480 Paroxysmal atrial fibrillation: Secondary | ICD-10-CM

## 2015-09-13 DIAGNOSIS — F0390 Unspecified dementia without behavioral disturbance: Secondary | ICD-10-CM

## 2015-09-13 DIAGNOSIS — E1169 Type 2 diabetes mellitus with other specified complication: Secondary | ICD-10-CM

## 2015-09-13 DIAGNOSIS — E1140 Type 2 diabetes mellitus with diabetic neuropathy, unspecified: Secondary | ICD-10-CM

## 2015-09-13 DIAGNOSIS — E785 Hyperlipidemia, unspecified: Secondary | ICD-10-CM

## 2015-09-13 DIAGNOSIS — Z794 Long term (current) use of insulin: Secondary | ICD-10-CM

## 2015-09-13 DIAGNOSIS — I110 Hypertensive heart disease with heart failure: Secondary | ICD-10-CM | POA: Diagnosis not present

## 2015-09-13 DIAGNOSIS — I5022 Chronic systolic (congestive) heart failure: Secondary | ICD-10-CM

## 2015-09-13 DIAGNOSIS — I6990 Unspecified sequelae of unspecified cerebrovascular disease: Secondary | ICD-10-CM

## 2015-09-13 NOTE — Progress Notes (Signed)
Patient ID: Casey [name], female   DOB: 1940/07/26, 75 y.o.   MRN: [id number]   Location:   [facility] Room Number: 106-A Place of Service:  SNF (31)   CODE STATUS: Full Code  Allergies  Allergen Reactions  . Penicillins Other (See Comments)    Patient states that she was told previously by a doctor to not take this medication but is unsure why.     Chief Complaint  Patient presents with  . Medical Management of Chronic Issues    Follow up    HPI:  She is a long term resident of this facility being seen for the management of her chronic illnesses. Her blood pressure readings have been low; will need to stop the norvasc. She is unable to participate in the hpi or ros. There are no nursing concerns at this time.   Past Medical History:  Diagnosis Date  . Abdominal or pelvic swelling, mass, or lump, left upper quadrant   . CHF (congestive heart failure) ([place])   . Chronic atrial fibrillation ([place])   . Chronic kidney disease   . Congestive heart failure, unspecified   . Coronary atherosclerosis of unspecified type of vessel, native or graft   . Diabetes mellitus   . Edema   . Electrolyte and fluid disorders not elsewhere classified   . GERD (gastroesophageal reflux disease)   . Gout, unspecified   . Gout, unspecified   . Hypercholesterolemia   . Hypertension   . Hypopotassemia   . Hypopotassemia   . Long term (current) use of anticoagulants   . Long term (current) use of anticoagulants   . 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  . Peripheral vascular disease, unspecified ([place])   . Peripheral vascular disease, unspecified ([place])   . Seizures ([place])   . Sinus of Valsalva aneurysm    a. By 2D echo 05/2011.  . Stroke ([place])   . Unspecified disorder of kidney and ureter   . Unspecified hereditary and idiopathic peripheral neuropathy   .  Unspecified hereditary and idiopathic peripheral neuropathy   . Unspecified late effects of cerebrovascular disease   . Unspecified late effects of cerebrovascular disease   . Unspecified urinary incontinence   . Urinary frequency   . Vascular dementia, uncomplicated     Past Surgical History:  Procedure Laterality Date  . AORTOGRAM  09/01/2008   Abd w/ bilateral lower extremity runoff arteriography  . FALSE ANEURYSM REPAIR  12/06/2011   Procedure: REPAIR FALSE ANEURYSM;  Surgeon: Angelia Mould, MD;  Location: [facility] OR;  Service: Vascular;  Laterality: Left;  Repair of left femoral Artery pseudoaneurysm  . FEMORAL ARTERY EXPLORATION  12/06/2011   Procedure: FEMORAL ARTERY EXPLORATION;  Surgeon: Angelia Mould, MD;  Location: [facility] OR;  Service: Vascular;  Laterality: N/A;  Exploration of large pseudoaneurysm left side of fem-fem bypass graft   . femoral to femoral bypass graft     . FEMORAL-FEMORAL BYPASS GRAFT  12/06/2011   Procedure: BYPASS GRAFT FEMORAL-FEMORAL ARTERY;  Surgeon: Angelia Mould, MD;  Location: [facility] OR;  Service: Vascular;  Laterality: Bilateral;  Revision of left to right Femoral-Femoral bypass graft  . FEMORAL-POPLITEAL BYPASS GRAFT Left 05/2002   lower extremity femoral to below knee w/ non-versed greater saphenous vein  . FEMORAL-POPLITEAL BYPASS GRAFT Left 11/2002  . TUBAL LIGATION Bilateral     Social History  Social History  . Marital status: Widowed    Spouse name: N/A  . Number of children: N/A  . Years of education: N/A   Occupational History  . Not on file.   Social History Main Topics  . Smoking status: Former Smoker    Types: Cigarettes    Quit date: 01/29/2001  . Smokeless tobacco: Never Used  . Alcohol use No  . Drug use: No  . Sexual activity: No   Other Topics Concern  . Not on file   Social History Narrative  . No narrative on file   Family History  Problem Relation Age of Onset  . Cancer Mother     Cervical  . Stroke  Mother   . Diabetes Mother   . Stroke Father   . Heart disease Father       VITAL SIGNS BP (!) 88/69   Pulse (!) 59   Temp 97.7 F (36.5 C) (Oral)   Resp 16   Ht 5\' 3"  (1.6 m)   Wt 115 lb 3 oz (52.2 kg)   SpO2 94%   BMI 20.40 kg/m   Patient's Medications  New Prescriptions   No medications on file  Previous Medications   ALLOPURINOL (ZYLOPRIM) 100 MG TABLET    Take 100 mg by mouth daily.   AMLODIPINE (NORVASC) 10 MG TABLET    Take 10 mg by mouth daily.   ASPIRIN 81 MG TABLET    Take 81 mg by mouth daily.   ATORVASTATIN (LIPITOR) 10 MG TABLET    Take 10 mg by mouth daily at 6 PM.   DIGOXIN (LANOXIN) 0.125 MG TABLET    Take 0.125 mg by mouth daily. Check apical heart rate prior to each dose. Hold dose if HR is less than 60 and notify MD   DONEPEZIL (ARICEPT) 10 MG TABLET    Take 1 tablet (10 mg total) by mouth at bedtime.   ENALAPRIL (VASOTEC) 2.5 MG TABLET    Take 1 tablet (2.5 mg total) by mouth daily.   LEVETIRACETAM (KEPPRA) 250 MG TABLET    Take 250 mg by mouth 2 (two) times daily.   METFORMIN (GLUCOPHAGE) 500 MG TABLET    Take 500 mg by mouth 2 (two) times daily with a meal.   METOPROLOL (LOPRESSOR) 50 MG TABLET    Take 50 mg by mouth 2 (two) times daily. Hold if pulse  < 60   MIRTAZAPINE (REMERON) 7.5 MG TABLET    Take 7.5 mg by mouth at bedtime.   MULTIPLE VITAMINS-MINERALS (DECUBI-VITE) CAPS    Take 1 capsule by mouth daily.   OXYBUTYNIN (DITROPAN) 5 MG TABLET    Take 1 tablet (5 mg total) by mouth 2 (two) times daily.   POTASSIUM CHLORIDE (K-DUR,KLOR-CON) 10 MEQ TABLET    Take 10 mEq by mouth daily.   SERTRALINE (ZOLOFT) 50 MG TABLET    Take 50 mg by mouth daily.   TRAMADOL (ULTRAM) 50 MG TABLET    Take 50 mg by mouth every 8 (eight) hours as needed.   UNABLE TO FIND    Med Name: Med Pass 2 give 90 cc three times daily by mouth. HSG Mech Soft texture  Modified Medications   No medications on file  Discontinued Medications   INSULIN GLARGINE (LANTUS) 100 UNIT/ML  INJECTION    Inject 12 Units into the skin at bedtime. Notify MD for CBg < or = 60, > or = 450     SIGNIFICANT DIAGNOSTIC EXAMS  05-21-14:  ct of chest: 1. Right upper lobe 5 mm nodule unchanged. 2. Additional nodules identified, some of which are noncalcified andvalso warrants follow-up. 3. Persistent mediastinal and hilar adenopathy. This may bevlong-standing given the previous reports of adenopathy. At the [facility] next exam, consider contrast-enhanced exam for further evaluation of the lymph nodes. 4. Given patient's history of smoking, follow-up chest CT at 6-12vmonths is recommended. 5. Coronary artery disease. . 01-10-15: ct of chest; 1. No acute findings. 2. Stable lung nodules as detailed above, the largest in the right upper lobe measuring 5 mm. Recommend additional unenhanced chest CT follow-up and 18 months per Fleischner criteria for following pulmonary nodules.      LABS REVIEWED:   09-10-14: hgb a1c 6.3; urine micro-albumin <1.2 10-15-14: wbc 5.6; hgb 12.5; hct 41.8; mcv 89.9; plt 209; glucose 88; bun 17.6; creat 0.98; k+ 4.8; na++144; liver normal albumin 4.0; ca 10.3; chol 152; ldl 75; trig 159; hdl 45  11-11-14: dig 1.01 10-15-14: wbc 5.6 ;hgb 12.5; hct 41.8; mcv 89.9; plt 209; glucose 88; bun 17.6; creat 0.98; k+ 4.8; na++144; liver normal albumin 4.0; chol 152; ;ldl 75; trig 159; hdl 45  12-31-14: hgb a1c 6.1  05-05-15: wbc 7.2; hgb 12.2; hct 37.2; mcv 88.5; plt 202; glucose 74; bun 59.2; creat 1.60; k+ 4.5; na++ 142; chol 126; ldl 46; trig 133; hdl 54; dig 1.49; tsh 0.86; hgb a1c 5.6  05-10-15: glucose 69; bun 37.8; creat 1.24; k+ 4.5; na++ 145     Review of Systems Unable to perform ROS: Dementia      Physical Exam Constitutional: No distress.  Thin   Neck: Neck supple. No JVD present. No thyromegaly present.  Cardiovascular: Normal rate, regular rhythm and intact distal pulses.   Respiratory: Effort normal and breath sounds normal. No respiratory distress.  GI:  Soft. Bowel sounds are normal. She exhibits no distension. There is no tenderness.  Musculoskeletal: She exhibits no edema.  Is able to move left extremities Has right hemiparesis    Neurological: She is alert.  Skin: Skin is warm and dry. She is not diaphoretic.     ASSESSMENT/ PLAN:  1. Gout no recent flares will continue allopurinol 100 mg daily   2. Hypertension: will continue  vasotec  2.5  mg daily will continue  lopressor 50 mg twice daily   Will stop her norvasc and will have nursing check blood pressure every 8 hours and will report in one week.   3. Afib: heart rate regular will continue  Asa 81 mg daily with digoxin 125 mcg daily dig level is 1.49; lopressor 50 mg twice daily for rate control.   4. Dementia: is without change will continue aricept 10 mg daily; will monitor her weight is 115 pounds will need to continue to monitor her weight; if she continues to lose weight will need to consider stopping the aricept.   5. Diabetes: will continue  metformin 500 mg twice daily  will monitor hgb a1c is 5.6  She is off lantus   6. Chronic systolic heart failure:  Ef is 35-40%; is stable will not make changes will monitor   7. Dyslipidemia: will continue lipitor 10 mg daily ldl is 46; trig 133   8. Seizures: no reports of recent activity present; will continue keppra 250 mg twice daily  9. UI: will continue ditropan 5 mg twice daily   10. Depression: will continue zoloft 50 mg daily   11. Pulmonary nodule: will continue ct scans every 18  months  and will monitor her status.   Her ct scan was done Dec 2016 and stable.   12. CVA: is neurologically stable; has right hemiparesis; will continue asa 81 mg daily   13. Weight loss: her current weight is 113 pounds; will continue remeron 7.5 mg nightly  will continue supplements per facility protocol and will monitor  14. Hypokalemia: will continue k+ 10 meq daily     Ok Edwards NP [facility]  Contact  [phone] Monday through Friday 8am- 5pm  After hours call [phone]

## 2015-09-14 LAB — HEMOGLOBIN A1C: HEMOGLOBIN A1C: 5.6

## 2015-09-20 LAB — BASIC METABOLIC PANEL
BUN: 27 mg/dL — AB (ref 4–21)
CREATININE: 1 mg/dL (ref 0.5–1.1)
GLUCOSE: 85 mg/dL
POTASSIUM: 4.5 mmol/L (ref 3.4–5.3)
SODIUM: 144 mmol/L (ref 137–147)

## 2015-10-10 ENCOUNTER — Other Ambulatory Visit: Payer: Self-pay | Admitting: *Deleted

## 2015-10-10 MED ORDER — TRAMADOL HCL 50 MG PO TABS: 50.0000 mg | ORAL_TABLET | Freq: Three times a day (TID) | ORAL | 0 refills | Status: DC | PRN

## 2015-10-10 NOTE — Telephone Encounter (Signed)
[facility] #[phone] Fax:[phone]  

## 2015-10-25 ENCOUNTER — Non-Acute Institutional Stay (SKILLED_NURSING_FACILITY): Payer: Medicare Other | Admitting: Adult Health

## 2015-10-25 ENCOUNTER — Encounter: Payer: Self-pay | Admitting: Adult Health

## 2015-10-25 DIAGNOSIS — I5022 Chronic systolic (congestive) heart failure: Secondary | ICD-10-CM | POA: Diagnosis not present

## 2015-10-25 DIAGNOSIS — Z794 Long term (current) use of insulin: Secondary | ICD-10-CM | POA: Diagnosis not present

## 2015-10-25 DIAGNOSIS — I110 Hypertensive heart disease with heart failure: Secondary | ICD-10-CM | POA: Diagnosis not present

## 2015-10-25 DIAGNOSIS — E1169 Type 2 diabetes mellitus with other specified complication: Secondary | ICD-10-CM

## 2015-10-25 DIAGNOSIS — E785 Hyperlipidemia, unspecified: Secondary | ICD-10-CM

## 2015-10-25 DIAGNOSIS — R569 Unspecified convulsions: Secondary | ICD-10-CM | POA: Diagnosis not present

## 2015-10-25 DIAGNOSIS — F0390 Unspecified dementia without behavioral disturbance: Secondary | ICD-10-CM | POA: Diagnosis not present

## 2015-10-25 DIAGNOSIS — I480 Paroxysmal atrial fibrillation: Secondary | ICD-10-CM

## 2015-10-25 DIAGNOSIS — I6990 Unspecified sequelae of unspecified cerebrovascular disease: Secondary | ICD-10-CM | POA: Diagnosis not present

## 2015-10-25 DIAGNOSIS — E1140 Type 2 diabetes mellitus with diabetic neuropathy, unspecified: Secondary | ICD-10-CM | POA: Diagnosis not present

## 2015-10-25 LAB — DIGOXIN LEVEL
Digoxin Level: 0.68
Magnesium: 1.7

## 2015-10-25 NOTE — Progress Notes (Addendum)
Patient ID: Casey [name], female   DOB: February 25, 1940, 75 y.o.   MRN: [id number]   Location:   [place] Room Number: 106-A Place of Service:  SNF (31)   CODE STATUS: Full Code  Allergies  Allergen Reactions  . Penicillins Other (See Comments)    Patient states that she was told previously by a doctor to not take this medication but is unsure why.     Chief Complaint  Patient presents with  . Medical Management of Chronic Issues    Follow up    HPI:  She is a long term resident of this facility being seen for the management of her chronic illnesses. Overall her status is stable. She does get out of bed on most days. She is unable to fully participate in the hpi or ros. There are no nursing concerns at this time.   Past Medical History:  Diagnosis Date  . Abdominal or pelvic swelling, mass, or lump, left upper quadrant   . CHF (congestive heart failure) ([place])   . Chronic atrial fibrillation ([place])   . Chronic kidney disease   . Congestive heart failure, unspecified   . Coronary atherosclerosis of unspecified type of vessel, native or graft   . Diabetes mellitus   . Edema   . Electrolyte and fluid disorders not elsewhere classified   . GERD (gastroesophageal reflux disease)   . Gout, unspecified   . Gout, unspecified   . Hypercholesterolemia   . Hypertension   . Hypopotassemia   . Hypopotassemia   . Long term (current) use of anticoagulants   . Long term (current) use of anticoagulants   . 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  . Peripheral vascular disease, unspecified ([place])   . Peripheral vascular disease, unspecified ([place])   . Seizures ([place])   . Sinus of Valsalva aneurysm    a. By 2D echo 05/2011.  . Stroke ([place])   . Unspecified disorder of kidney and ureter   . Unspecified hereditary and idiopathic peripheral neuropathy   .  Unspecified hereditary and idiopathic peripheral neuropathy   . Unspecified late effects of cerebrovascular disease   . Unspecified late effects of cerebrovascular disease   . Unspecified urinary incontinence   . Urinary frequency   . Vascular dementia, uncomplicated     Past Surgical History:  Procedure Laterality Date  . AORTOGRAM  09/01/2008   Abd w/ bilateral lower extremity runoff arteriography  . FALSE ANEURYSM REPAIR  12/06/2011   Procedure: REPAIR FALSE ANEURYSM;  Surgeon: Angelia Mould, MD;  Location: [facility] OR;  Service: Vascular;  Laterality: Left;  Repair of left femoral Artery pseudoaneurysm  . FEMORAL ARTERY EXPLORATION  12/06/2011   Procedure: FEMORAL ARTERY EXPLORATION;  Surgeon: Angelia Mould, MD;  Location: [facility] OR;  Service: Vascular;  Laterality: N/A;  Exploration of large pseudoaneurysm left side of fem-fem bypass graft   . femoral to femoral bypass graft     . FEMORAL-FEMORAL BYPASS GRAFT  12/06/2011   Procedure: BYPASS GRAFT FEMORAL-FEMORAL ARTERY;  Surgeon: Angelia Mould, MD;  Location: [facility] OR;  Service: Vascular;  Laterality: Bilateral;  Revision of left to right Femoral-Femoral bypass graft  . FEMORAL-POPLITEAL BYPASS GRAFT Left 05/2002   lower extremity femoral to below knee w/ non-versed greater saphenous vein  . FEMORAL-POPLITEAL BYPASS GRAFT Left 11/2002  . TUBAL LIGATION Bilateral     Social  History   Social History  . Marital status: Widowed    Spouse name: N/A  . Number of children: N/A  . Years of education: N/A   Occupational History  . Not on file.   Social History Main Topics  . Smoking status: Former Smoker    Types: Cigarettes    Quit date: 01/29/2001  . Smokeless tobacco: Never Used  . Alcohol use No  . Drug use: No  . Sexual activity: No   Other Topics Concern  . Not on file   Social History Narrative  . No narrative on file   Family History  Problem Relation Age of Onset  . Cancer Mother     Cervical  . Stroke  Mother   . Diabetes Mother   . Stroke Father   . Heart disease Father       VITAL SIGNS BP 122/85   Pulse 62   Temp 97.9 F (36.6 C) (Oral)   Resp 20   Ht 5\' 3"  (1.6 m)   Wt 113 lb (51.3 kg)   SpO2 96%   BMI 20.02 kg/m   Patient's Medications  New Prescriptions   No medications on file  Previous Medications   ALLOPURINOL (ZYLOPRIM) 100 MG TABLET    Take 100 mg by mouth daily.   AMLODIPINE (NORVASC) 10 MG TABLET    Take 10 mg by mouth daily.   ASPIRIN 81 MG TABLET    Take 81 mg by mouth daily.   ATORVASTATIN (LIPITOR) 10 MG TABLET    Take 10 mg by mouth daily at 6 PM.   DIGOXIN (LANOXIN) 0.125 MG TABLET    Take 0.125 mg by mouth daily. Check apical heart rate prior to each dose. Hold dose if HR is less than 60 and notify MD   DONEPEZIL (ARICEPT) 10 MG TABLET    Take 1 tablet (10 mg total) by mouth at bedtime.   ENALAPRIL (VASOTEC) 2.5 MG TABLET    Take 1 tablet (2.5 mg total) by mouth daily.   LEVETIRACETAM (KEPPRA) 250 MG TABLET    Take 250 mg by mouth 2 (two) times daily.   METFORMIN (GLUCOPHAGE) 500 MG TABLET    Take 500 mg by mouth 2 (two) times daily with a meal.   METOPROLOL (LOPRESSOR) 50 MG TABLET    Take 50 mg by mouth 2 (two) times daily. Hold if pulse  < 60   MIRTAZAPINE (REMERON) 7.5 MG TABLET    Take 7.5 mg by mouth at bedtime.   MULTIPLE VITAMINS-MINERALS (DECUBI-VITE) CAPS    Take 1 capsule by mouth daily.   OXYBUTYNIN (DITROPAN) 5 MG TABLET    Take 1 tablet (5 mg total) by mouth 2 (two) times daily.   POTASSIUM CHLORIDE (K-DUR,KLOR-CON) 10 MEQ TABLET    Take 10 mEq by mouth daily.   SERTRALINE (ZOLOFT) 50 MG TABLET    Take 50 mg by mouth daily.   TRAMADOL (ULTRAM) 50 MG TABLET    Take 1 tablet (50 mg total) by mouth every 8 (eight) hours as needed.   UNABLE TO FIND    Med Name: Med Pass 2 give 90 cc three times daily by mouth. HSG Mech Soft texture  Modified Medications   No medications on file  Discontinued Medications   No medications on file      SIGNIFICANT DIAGNOSTIC EXAMS  05-21-14: ct of chest: 1. Right upper lobe 5 mm nodule unchanged. 2. Additional nodules identified, some of which are noncalcified andvalso warrants follow-up.  3. Persistent mediastinal and hilar adenopathy. This may bevlong-standing given the previous reports of adenopathy. At the [facility] next exam, consider contrast-enhanced exam for further evaluation of the lymph nodes. 4. Given patient's history of smoking, follow-up chest CT at 6-12vmonths is recommended. 5. Coronary artery disease. . 01-10-15: ct of chest; 1. No acute findings. 2. Stable lung nodules as detailed above, the largest in the right upper lobe measuring 5 mm. Recommend additional unenhanced chest CT follow-up and 18 months per Fleischner criteria for following pulmonary nodules.      LABS REVIEWED:   10-15-14: wbc 5.6; hgb 12.5; hct 41.8; mcv 89.9; plt 209; glucose 88; bun 17.6; creat 0.98; k+ 4.8; na++144; liver normal albumin 4.0; ca 10.3; chol 152; ldl 75; trig 159; hdl 45  11-11-14: dig 1.01 10-15-14: wbc 5.6 ;hgb 12.5; hct 41.8; mcv 89.9; plt 209; glucose 88; bun 17.6; creat 0.98; k+ 4.8; na++144; liver normal albumin 4.0; chol 152; ;ldl 75; trig 159; hdl 45  12-31-14: hgb a1c 6.1  05-05-15: wbc 7.2; hgb 12.2; hct 37.2; mcv 88.5; plt 202; glucose 74; bun 59.2; creat 1.60; k+ 4.5; na++ 142; chol 126; ldl 46; trig 133; hdl 54; dig 1.49; tsh 0.86; hgb a1c 5.6  05-10-15: glucose 69; bun 37.8; creat 1.24; k+ 4.5; na++ 145  09-14-15: hgb a1c 5.6 09-20-15: glucose 85; bun 26.8; creat 1.04; k+ 4.5; na++144; mag 1.7; dig 0.68    Review of Systems Unable to perform ROS: Dementia      Physical Exam Constitutional: No distress.  Thin   Neck: Neck supple. No JVD present. No thyromegaly present.  Cardiovascular: Normal rate, regular rhythm and intact distal pulses.   Respiratory: Effort normal and breath sounds normal. No respiratory distress.  GI: Soft. Bowel sounds are normal. She exhibits  no distension. There is no tenderness.  Musculoskeletal: She exhibits no edema.  Is able to move left extremities Has right hemiparesis    Neurological: She is alert.  Skin: Skin is warm and dry. She is not diaphoretic.     ASSESSMENT/ PLAN:  1. Gout no recent flares will continue allopurinol 100 mg daily   2. Hypertension: will continue  vasotec  2.5  mg daily will continue  lopressor 50 mg twice daily   3. Afib: heart rate regular will continue  Asa 81 mg daily with digoxin 125 mcg daily dig level is 0.68; lopressor 50 mg twice daily for rate control.   4. Dementia: is without change will continue aricept 10 mg daily; will monitor her weight is 113 pounds will need to continue to monitor her weight; if she continues to lose weight will need to consider stopping the aricept.   5. Diabetes: will continue  metformin 500 mg twice daily  will monitor hgb a1c is 5.6  She is off lantus   6. Chronic systolic heart failure:  Ef is 35-40%; is stable will not make changes will monitor   7. Dyslipidemia: will continue lipitor 10 mg daily ldl is 46; trig 133   8. Seizures: no reports of recent activity present; will continue keppra 250 mg twice daily  9. UI: will continue ditropan 5 mg twice daily   10. Depression: will continue zoloft 50 mg daily   11. Pulmonary nodule: will continue ct scans every 18  months and will monitor her status.   Her ct scan was done Dec 2016 and stable.   12. CVA: is neurologically stable; has right hemiparesis; will continue asa 81 mg daily  13. Weight loss: her current weight is 113 pounds; will continue remeron 7.5 mg nightly  will continue supplements per facility protocol and will monitor  14. Hypokalemia:   K+ is 4.5 will stop k+ supplement and will monitor            MD is aware of resident's narcotic use and is in agreement with current plan of care. We will attempt to wean resident as apropriate   Ok Edwards NP [facility]   Contact [phone] Monday through Friday 8am- 5pm  After hours call [phone]

## 2015-11-10 LAB — HEPATIC FUNCTION PANEL
ALT: 24 U/L (ref 7–35)
AST: 26 U/L (ref 13–35)
Alkaline Phosphatase: 86 U/L (ref 25–125)
BILIRUBIN, TOTAL: 0.3 mg/dL

## 2015-11-10 LAB — CBC AND DIFFERENTIAL
HCT: 37 % (ref 36–46)
Hemoglobin: 12.2 g/dL (ref 12.0–16.0)
PLATELETS: 209 10*3/uL (ref 150–399)
WBC: 6.7 10*3/mL

## 2015-11-10 LAB — BASIC METABOLIC PANEL
BUN: 24 mg/dL — AB (ref 4–21)
CREATININE: 1.2 mg/dL — AB (ref 0.5–1.1)
GLUCOSE: 83 mg/dL
Potassium: 5.5 mmol/L — AB (ref 3.4–5.3)
Sodium: 144 mmol/L (ref 137–147)

## 2015-11-10 LAB — LIPID PANEL
Cholesterol: 117 mg/dL (ref 0–200)
HDL: 50 mg/dL (ref 35–70)
LDL Cholesterol: 41 mg/dL
Triglycerides: 131 mg/dL (ref 40–160)

## 2015-11-10 LAB — MICROALBUMIN, URINE: Microalb, Ur: <1.2

## 2015-11-18 ENCOUNTER — Non-Acute Institutional Stay (SKILLED_NURSING_FACILITY): Payer: Medicare Other | Admitting: Adult Health

## 2015-11-18 ENCOUNTER — Encounter: Payer: Self-pay | Admitting: Adult Health

## 2015-11-18 DIAGNOSIS — M109 Gout, unspecified: Secondary | ICD-10-CM

## 2015-11-18 DIAGNOSIS — I5022 Chronic systolic (congestive) heart failure: Secondary | ICD-10-CM | POA: Diagnosis not present

## 2015-11-18 DIAGNOSIS — E785 Hyperlipidemia, unspecified: Secondary | ICD-10-CM

## 2015-11-18 DIAGNOSIS — R911 Solitary pulmonary nodule: Secondary | ICD-10-CM | POA: Diagnosis not present

## 2015-11-18 DIAGNOSIS — I480 Paroxysmal atrial fibrillation: Secondary | ICD-10-CM

## 2015-11-18 DIAGNOSIS — I6990 Unspecified sequelae of unspecified cerebrovascular disease: Secondary | ICD-10-CM

## 2015-11-18 DIAGNOSIS — R569 Unspecified convulsions: Secondary | ICD-10-CM | POA: Diagnosis not present

## 2015-11-18 DIAGNOSIS — F0150 Vascular dementia without behavioral disturbance: Secondary | ICD-10-CM | POA: Diagnosis not present

## 2015-11-18 DIAGNOSIS — E1140 Type 2 diabetes mellitus with diabetic neuropathy, unspecified: Secondary | ICD-10-CM

## 2015-11-18 DIAGNOSIS — Z794 Long term (current) use of insulin: Secondary | ICD-10-CM

## 2015-11-18 DIAGNOSIS — E1169 Type 2 diabetes mellitus with other specified complication: Secondary | ICD-10-CM

## 2015-11-18 DIAGNOSIS — I110 Hypertensive heart disease with heart failure: Secondary | ICD-10-CM | POA: Diagnosis not present

## 2015-11-18 NOTE — Progress Notes (Signed)
Patient ID: Casey [name], female   DOB: 1940-07-14, 75 y.o.   MRN: [id number]   Location:   [place] Room Number: 106-A Place of Service:  SNF (31)   CODE STATUS: Full Code  Allergies  Allergen Reactions  . Penicillins Other (See Comments)    Patient states that she was told previously by a doctor to not take this medication but is unsure why.     Chief Complaint  Patient presents with  . Medical Management of Chronic Issues    Follow up    HPI: She is a long term resident of this facility being seen for the management of her chronic illnesses. Overall her status is stable. She is unable to participate in the hpi or ros. There are no nursing concerns at this time.    Past Medical History:  Diagnosis Date  . Abdominal or pelvic swelling, mass, or lump, left upper quadrant   . CHF (congestive heart failure) ([place])   . Chronic atrial fibrillation ([place])   . Chronic kidney disease   . Congestive heart failure, unspecified   . Coronary atherosclerosis of unspecified type of vessel, native or graft   . Diabetes mellitus   . Edema   . Electrolyte and fluid disorders not elsewhere classified   . GERD (gastroesophageal reflux disease)   . Gout, unspecified   . Gout, unspecified   . Hypercholesterolemia   . Hypertension   . Hypopotassemia   . Hypopotassemia   . Long term (current) use of anticoagulants   . Long term (current) use of anticoagulants   . 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  . Peripheral vascular disease, unspecified   . Peripheral vascular disease, unspecified   . Seizures ([place])   . Sinus of Valsalva aneurysm    a. By 2D echo 05/2011.  . Stroke ([place])   . Unspecified disorder of kidney and ureter   . Unspecified hereditary and idiopathic peripheral neuropathy   . Unspecified hereditary and idiopathic peripheral  neuropathy   . Unspecified late effects of cerebrovascular disease   . Unspecified late effects of cerebrovascular disease   . Unspecified urinary incontinence   . Urinary frequency   . Vascular dementia, uncomplicated     Past Surgical History:  Procedure Laterality Date  . AORTOGRAM  09/01/2008   Abd w/ bilateral lower extremity runoff arteriography  . FALSE ANEURYSM REPAIR  12/06/2011   Procedure: REPAIR FALSE ANEURYSM;  Surgeon: Angelia Mould, MD;  Location: [facility] OR;  Service: Vascular;  Laterality: Left;  Repair of left femoral Artery pseudoaneurysm  . FEMORAL ARTERY EXPLORATION  12/06/2011   Procedure: FEMORAL ARTERY EXPLORATION;  Surgeon: Angelia Mould, MD;  Location: [facility] OR;  Service: Vascular;  Laterality: N/A;  Exploration of large pseudoaneurysm left side of fem-fem bypass graft   . femoral to femoral bypass graft     . FEMORAL-FEMORAL BYPASS GRAFT  12/06/2011   Procedure: BYPASS GRAFT FEMORAL-FEMORAL ARTERY;  Surgeon: Angelia Mould, MD;  Location: [facility] OR;  Service: Vascular;  Laterality: Bilateral;  Revision of left to right Femoral-Femoral bypass graft  . FEMORAL-POPLITEAL BYPASS GRAFT Left 05/2002   lower extremity femoral to below knee w/ non-versed greater saphenous vein  . FEMORAL-POPLITEAL BYPASS GRAFT Left 11/2002  . TUBAL LIGATION Bilateral     Social History   Social History  . Marital status: Widowed  Spouse name: N/A  . Number of children: N/A  . Years of education: N/A   Occupational History  . Not on file.   Social History Main Topics  . Smoking status: Former Smoker    Types: Cigarettes    Quit date: 01/29/2001  . Smokeless tobacco: Never Used  . Alcohol use No  . Drug use: No  . Sexual activity: No   Other Topics Concern  . Not on file   Social History Narrative  . No narrative on file   Family History  Problem Relation Age of Onset  . Cancer Mother     Cervical  . Stroke Mother   . Diabetes Mother   . Stroke Father     . Heart disease Father       VITAL SIGNS BP 137/60   Pulse 68   Temp 98.7 F (37.1 C) (Oral)   Resp 16   Ht 5\' 3"  (1.6 m)   Wt 116 lb 8 oz (52.8 kg)   SpO2 98%   BMI 20.64 kg/m   Patient's Medications  New Prescriptions   No medications on file  Previous Medications   ALLOPURINOL (ZYLOPRIM) 100 MG TABLET    Take 100 mg by mouth daily.   ASPIRIN 81 MG TABLET    Take 81 mg by mouth daily.   ATORVASTATIN (LIPITOR) 10 MG TABLET    Take 10 mg by mouth daily at 6 PM.   DIGOXIN (LANOXIN) 0.125 MG TABLET    Take 0.125 mg by mouth daily. Check apical heart rate prior to each dose. Hold dose if HR is less than 60 and notify MD   DONEPEZIL (ARICEPT) 10 MG TABLET    Take 1 tablet (10 mg total) by mouth at bedtime.   ENALAPRIL (VASOTEC) 2.5 MG TABLET    Take 1 tablet (2.5 mg total) by mouth daily.   LEVETIRACETAM (KEPPRA) 250 MG TABLET    Take 250 mg by mouth 2 (two) times daily.   METFORMIN (GLUCOPHAGE) 500 MG TABLET    Take 500 mg by mouth 2 (two) times daily with a meal.   METOPROLOL (LOPRESSOR) 50 MG TABLET    Take 50 mg by mouth 2 (two) times daily. Hold if pulse  < 60   MIRTAZAPINE (REMERON) 7.5 MG TABLET    Take 7.5 mg by mouth at bedtime.   MULTIPLE VITAMINS-MINERALS (DECUBI-VITE) CAPS    Take 1 capsule by mouth daily.   OXYBUTYNIN (DITROPAN) 5 MG TABLET    Take 1 tablet (5 mg total) by mouth 2 (two) times daily.   SERTRALINE (ZOLOFT) 50 MG TABLET    Take 50 mg by mouth daily.   TRAMADOL (ULTRAM) 50 MG TABLET    Take 1 tablet (50 mg total) by mouth every 8 (eight) hours as needed.   UNABLE TO FIND    Med Name: Med Pass 2 give 90 cc three times daily by mouth. HSG Mech Soft texture  Modified Medications   No medications on file  Discontinued Medications   AMLODIPINE (NORVASC) 10 MG TABLET    Take 10 mg by mouth daily.   POTASSIUM CHLORIDE (K-DUR,KLOR-CON) 10 MEQ TABLET    Take 10 mEq by mouth daily.     SIGNIFICANT DIAGNOSTIC EXAMS  05-21-14: ct of chest: 1. Right upper lobe  5 mm nodule unchanged. 2. Additional nodules identified, some of which are noncalcified andvalso warrants follow-up. 3. Persistent mediastinal and hilar adenopathy. This may bevlong-standing given the previous reports of adenopathy. At  the timevof next exam, consider contrast-enhanced exam for further evaluation of the lymph nodes. 4. Given patient's history of smoking, follow-up chest CT at 6-12vmonths is recommended. 5. Coronary artery disease. . 01-10-15: ct of chest; 1. No acute findings. 2. Stable lung nodules as detailed above, the largest in the right upper lobe measuring 5 mm. Recommend additional unenhanced chest CT follow-up and 18 months per Fleischner criteria for following pulmonary nodules.      LABS REVIEWED:    11-11-14: dig 1.01 10-15-14: wbc 5.6 ;hgb 12.5; hct 41.8; mcv 89.9; plt 209; glucose 88; bun 17.6; creat 0.98; k+ 4.8; na++144; liver normal albumin 4.0; chol 152; ;ldl 75; trig 159; hdl 45  12-31-14: hgb a1c 6.1  05-05-15: wbc 7.2; hgb 12.2; hct 37.2; mcv 88.5; plt 202; glucose 74; bun 59.2; creat 1.60; k+ 4.5; na++ 142; chol 126; ldl 46; trig 133; hdl 54; dig 1.49; tsh 0.86; hgb a1c 5.6  05-10-15: glucose 69; bun 37.8; creat 1.24; k+ 4.5; na++ 145  09-14-15: hgb a1c 5.6 09-20-15: glucose 85; bun 26.8; creat 1.04; k+ 4.5; na++144; mag 1.7; dig 0.68    Review of Systems Unable to perform ROS: Dementia      Physical Exam Constitutional: No distress.  Thin   Neck: Neck supple. No JVD present. No thyromegaly present.  Cardiovascular: Normal rate, regular rhythm and intact distal pulses.   Respiratory: Effort normal and breath sounds normal. No respiratory distress.  GI: Soft. Bowel sounds are normal. She exhibits no distension. There is no tenderness.  Musculoskeletal: She exhibits no edema.  Is able to move left extremities Has right hemiparesis    Neurological: She is alert.  Skin: Skin is warm and dry. She is not diaphoretic.     ASSESSMENT/ PLAN:  1. Gout  no recent flares will continue allopurinol 100 mg daily   2. Hypertension: will continue  vasotec  2.5  mg daily will continue  lopressor 50 mg twice daily   3. Afib: heart rate regular will continue  Asa 81 mg daily with digoxin 125 mcg daily dig level is 0.68; lopressor 50 mg twice daily for rate control.   4. Dementia: is without change will continue aricept 10 mg daily; will monitor her weight is 116 pounds will need to continue to monitor   5. Diabetes: will continue  metformin 500 mg twice daily  will monitor hgb a1c is 5.6  She is off lantus   6. Chronic systolic heart failure:  Ef is 35-40%; is stable will not make changes will monitor   7. Dyslipidemia: will continue lipitor 10 mg daily ldl is 46; trig 133   8. Seizures: no reports of recent activity present; will continue keppra 250 mg twice daily  9. UI: will continue ditropan 5 mg twice daily   10. Depression: will continue zoloft 50 mg daily   11. Pulmonary nodule: will continue ct scans every 18  months and will monitor her status.   Her ct scan was done Dec 2016 and stable.   12. CVA: is neurologically stable; has right hemiparesis; will continue asa 81 mg daily   13. Weight loss: her current weight is 113 pounds; will continue remeron 7.5 mg nightly  will continue supplements per facility protocol and will monitor  14. Hypokalemia:  Is of supplement will monitor      MD is aware of resident's narcotic use and is in agreement with current plan of care. We will attempt to wean resident as apropriate  Ok Edwards NP [facility]  Contact [phone] Monday through Friday 8am- 5pm  After hours call [phone]

## 2015-12-13 ENCOUNTER — Non-Acute Institutional Stay (SKILLED_NURSING_FACILITY): Payer: Medicare Other | Admitting: Internal Medicine

## 2015-12-13 DIAGNOSIS — E1140 Type 2 diabetes mellitus with diabetic neuropathy, unspecified: Secondary | ICD-10-CM | POA: Diagnosis not present

## 2015-12-13 DIAGNOSIS — R634 Abnormal weight loss: Secondary | ICD-10-CM

## 2015-12-13 DIAGNOSIS — Z794 Long term (current) use of insulin: Secondary | ICD-10-CM | POA: Diagnosis not present

## 2015-12-13 DIAGNOSIS — R569 Unspecified convulsions: Secondary | ICD-10-CM | POA: Diagnosis not present

## 2015-12-13 DIAGNOSIS — I480 Paroxysmal atrial fibrillation: Secondary | ICD-10-CM

## 2015-12-13 DIAGNOSIS — F0150 Vascular dementia without behavioral disturbance: Secondary | ICD-10-CM | POA: Diagnosis not present

## 2015-12-13 DIAGNOSIS — I5022 Chronic systolic (congestive) heart failure: Secondary | ICD-10-CM

## 2015-12-13 NOTE — Progress Notes (Signed)
This is a routine visit.  Level of care skilled.  Facility is Cytogeneticist of chronic medical issues including hypertension-atrial fibrillation- Dementia-diabetes type 2-chronic systolic CHF-dyslipidemia--history of CVA-weight loss    History of present was.  Patient is a 75 year old female with the above diagnoses per nursing they do not really report any recent acute issues-she does have some history of weight loss and appears she's lost approximately 5 pounds over the past several weeks-she is on Remeron-she does not report any difficulty swallowing or acute abdominal discomfort intervening of this-.  Her other medical conditions appear to be stable I note systolic pressure today is 159 but this does appear to be quite variable per chart review it appears her baseline systolics is an they [id number] range diastolics in the 0000000 to Q000111Q with quite a bit of fluctuation ranging from the low [id number] up to 0000000 systolically with no consistent trend here-she is on enalapril 2.5 mg a day.  Regards-ablation this appears rate controlled she is on digoxin-as well as metoprolol 50 mg a day-she is on aspirin for anticoagulation.  She does have a history diabetes type 2 is on Glucophage 100 mg twice a day her sugars appear to be quite stable largely in the lower 100s.  Currently she is resting in bed comfortably does not have any acute complaints she is a poor historian however secondary to dementia  Past Medical History:  Diagnosis Date  . Abdominal or pelvic swelling, mass, or lump, left upper quadrant   . CHF (congestive heart failure) ([place])   . Chronic atrial fibrillation ([place])   . Chronic kidney disease   . Congestive heart failure, unspecified   . Coronary atherosclerosis of unspecified type of vessel, native or graft   . Diabetes mellitus   . Edema   . Electrolyte and fluid disorders not elsewhere classified   . GERD  (gastroesophageal reflux disease)   . Gout, unspecified   . Gout, unspecified   . Hypercholesterolemia   . Hypertension   . Hypopotassemia   . Hypopotassemia   . Long term (current) use of anticoagulants   . Long term (current) use of anticoagulants   . 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  . Peripheral vascular disease, unspecified   . Peripheral vascular disease, unspecified   . Seizures ([place])   . Sinus of Valsalva aneurysm    a. By 2D echo 05/2011.  . Stroke ([place])   . Unspecified disorder of kidney and ureter   . Unspecified hereditary and idiopathic peripheral neuropathy   . Unspecified hereditary and idiopathic peripheral neuropathy   . Unspecified late effects of cerebrovascular disease   . Unspecified late effects of cerebrovascular disease   . Unspecified urinary incontinence   . Urinary frequency   . Vascular dementia, uncomplicated          Past Surgical History:  Procedure Laterality Date  . AORTOGRAM  09/01/2008   Abd w/ bilateral lower extremity runoff arteriography  . FALSE ANEURYSM REPAIR  12/06/2011   Procedure: REPAIR FALSE ANEURYSM;  Surgeon: Angelia Mould, MD;  Location: [facility] OR;  Service: Vascular;  Laterality: Left;  Repair of left femoral Artery pseudoaneurysm  . FEMORAL ARTERY EXPLORATION  12/06/2011   Procedure: FEMORAL ARTERY EXPLORATION;  Surgeon: Angelia Mould, MD;  Location: [facility] OR;  Service: Vascular;  Laterality: N/A;  Exploration of  large pseudoaneurysm left side of fem-fem bypass graft   . femoral to femoral bypass graft     . FEMORAL-FEMORAL BYPASS GRAFT  12/06/2011   Procedure: BYPASS GRAFT FEMORAL-FEMORAL ARTERY;  Surgeon: Angelia Mould, MD;  Location: [facility] OR;  Service: Vascular;  Laterality: Bilateral;  Revision of left to right Femoral-Femoral bypass graft  .  FEMORAL-POPLITEAL BYPASS GRAFT Left 05/2002   lower extremity femoral to below knee w/ non-versed greater saphenous vein  . FEMORAL-POPLITEAL BYPASS GRAFT Left 11/2002  . TUBAL LIGATION Bilateral     Social History        Social History  . Marital status: Widowed    Spouse name: N/A  . Number of children: N/A  . Years of education: N/A      Occupational History  . Not on file.        Social History Main Topics  . Smoking status: Former Smoker    Types: Cigarettes    Quit date: 01/29/2001  . Smokeless tobacco: Never Used  . Alcohol use No  . Drug use: No  . Sexual activity: No       Other Topics Concern  . Not on file      Social History Narrative  . No narrative on file         Family History  Problem Relation Age of Onset  . Cancer Mother     Cervical  . Stroke Mother   . Diabetes Mother   . Stroke Father   . Heart disease Father       VITAL SIGNS  Pressure 159/67-100/60-133/84-most recently weight again is 107.9-pulse is 60 on exam-temperature 98.5-respirations 20-O2 saturation 95% on room air       Patient's Medications  New Prescriptions   No medications on file  Previous Medications   ALLOPURINOL (ZYLOPRIM) 100 MG TABLET    Take 100 mg by mouth daily.   ASPIRIN 81 MG TABLET    Take 81 mg by mouth daily.   ATORVASTATIN (LIPITOR) 10 MG TABLET    Take 10 mg by mouth daily at 6 PM.   DIGOXIN (LANOXIN) 0.125 MG TABLET    Take 0.125 mg by mouth daily. Check apical heart rate prior to each dose. Hold dose if HR is less than 60 and notify MD   DONEPEZIL (ARICEPT) 10 MG TABLET    Take 1 tablet (10 mg total) by mouth at bedtime.   ENALAPRIL (VASOTEC) 2.5 MG TABLET    Take 1 tablet (2.5 mg total) by mouth daily.   LEVETIRACETAM (KEPPRA) 250 MG TABLET    Take 250 mg by mouth 2 (two) times daily.   METFORMIN (GLUCOPHAGE) 500 MG TABLET    Take 500 mg by mouth 2 (two) times daily with a meal.   METOPROLOL (LOPRESSOR) 50 MG  TABLET    Take 50 mg by mouth 2 (two) times daily. Hold if pulse  < 60   MIRTAZAPINE (REMERON) 7.5 MG TABLET    Take 7.5 mg by mouth at bedtime.   MULTIPLE VITAMINS-MINERALS (DECUBI-VITE) CAPS    Take 1 capsule by mouth daily.   OXYBUTYNIN (DITROPAN) 5 MG TABLET    Take 1 tablet (5 mg total) by mouth 2 (two) times daily.   SERTRALINE (ZOLOFT) 50 MG TABLET    Take 50 mg by mouth daily.   TRAMADOL (ULTRAM) 50 MG TABLET    Take 1 tablet (50 mg total) by mouth every 8 (eight) hours as needed.   UNABLE TO FIND  Med Name: Med Pass 2 give 90 cc three times daily by mouth. HSG Mech Soft texture  Modified Medications   No medications on file  Discontinued Medications               SIGNIFICANT DIAGNOSTIC EXAMS  05-21-14: ct of chest: 1. Right upper lobe 5 mm nodule unchanged. 2. Additional nodules identified, some of which are noncalcified andvalso warrants follow-up. 3. Persistent mediastinal and hilar adenopathy. This may bevlong-standing given the previous reports of adenopathy. At the [facility] next exam, consider contrast-enhanced exam for further evaluation of the lymph nodes. 4. Given patient's history of smoking, follow-up chest CT at 6-12vmonths is recommended. 5. Coronary artery disease. . 01-10-15: ct of chest; 1. No acute findings. 2. Stable lung nodules as detailed above, the largest in the right upper lobe measuring 5 mm. Recommend additional unenhanced chest CT follow-up and 18 months per Fleischner criteria for following pulmonary nodules.     LABS REVIEWED:    11-11-14: dig 1.01 10-15-14: wbc 5.6 ;hgb 12.5; hct 41.8; mcv 89.9; plt 209; glucose 88; bun 17.6; creat 0.98; k+ 4.8; na++144; liver normal albumin 4.0; chol 152; ;ldl 75; trig 159; hdl 45  12-31-14: hgb a1c 6.1  05-05-15: wbc 7.2; hgb 12.2; hct 37.2; mcv 88.5; plt 202; glucose 74; bun 59.2; creat 1.60; k+ 4.5; na++ 142; chol 126; ldl 46; trig 133; hdl 54; dig 1.49; tsh 0.86; hgb a1c 5.6  05-10-15: glucose  69; bun 37.8; creat 1.24; k+ 4.5; na++ 145  09-14-15: hgb a1c 5.6 09-20-15: glucose 85; bun 26.8; creat 1.04; k+ 4.5; na++144; mag 1.7; dig 0.68    Review of Systems Unable to perform ROS: Dementia --please see history of present illness     Physical Exam Constitutional: No distress. Lying comfortably in bed  Thin   Neck: Neck supple. No JVD present. No thyromegaly present.  Cardiovascular: Normal rate  ,irregular  rhythm and intact distal pulses.   Respiratory: Effort normal and breath sounds normal. No respiratory distress.  GI: Soft. Bowel sounds are normal. She exhibits no distension. There is no tenderness. Any tenderness appears to be due to the invasive maneuver  Musculoskeletal: She exhibits no edema.  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 She is not diaphoretic.     ASSESSMENT/ PLAN:  1. Gout no recent flares will continue allopurinol 100 mg daily   2. Hypertension: will continue  vasotec  2.5  mg daily will continue  lopressor 50 mg twice daily variable blood pressures as noted above but I do not see consistent elevations  3. Afib: heart rate regular will continue  Asa 81 mg daily with digoxin 125 mcg daily dig level is 0.68 in August 2017; lopressor 50 mg twice daily for rate control Will update digoxin level   4. Dementia: is without change will continue aricept 10 mg daily; will monitor her weight is 107.9  pounds will need to continue to monitor as noted below  5. Diabetes: will continue  metformin 500 mg twice daily  will monitor hgb a1c is 5.6  She is off lantus   6. Chronic systolic heart failure:  Ef is 35-40%; is stable will not make changes will monitor no chest congestion or increased edema noted   7. Dyslipidemia: will continue lipitor 10 mg daily ldl is 46; trig 133  8. Seizures: no  reports of recent activity present; will continue keppra 250 mg twice daily no recent seizures to my knowledge  9. UI: will continue ditropan 5 mg twice daily   10. Depression: will continue zoloft 50 mg daily   11. Pulmonary nodule: will continue ct scans every 18  months and will monitor her status.   Her ct scan was done Dec 2016 and stable.   12. CVA: is neurologically stable; has right hemiparesis; will continue asa 81 mg daily   13. Weight loss: her current weight is 107.9 pounds; will continue remeron 7.5 mg nightly --dietary is aware and supplements are being encouraged apparently at times she eats up to 50% of her meal-she will need dietary follow-up however for this also will update lab work including an albumin level   14. Hypokalemia: In the past this has been supplemented-will need an updated BMP   [id number]-of note greater than 40 minutes spent assessing patient-reviewing her chart-her labs-discussing her status with nursing staff-and coordinating and formulating a plan of care for numerous diagnoses-of note greater than 50% of time spent coordinating plan of care

## 2016-01-05 ENCOUNTER — Encounter: Payer: Self-pay | Admitting: Internal Medicine

## 2016-01-05 ENCOUNTER — Non-Acute Institutional Stay (SKILLED_NURSING_FACILITY): Payer: Medicare Other | Admitting: Internal Medicine

## 2016-01-05 DIAGNOSIS — F32A Depression, unspecified: Secondary | ICD-10-CM

## 2016-01-05 DIAGNOSIS — R634 Abnormal weight loss: Secondary | ICD-10-CM

## 2016-01-05 DIAGNOSIS — R569 Unspecified convulsions: Secondary | ICD-10-CM | POA: Diagnosis not present

## 2016-01-05 DIAGNOSIS — N183 Chronic kidney disease, stage 3 (moderate): Secondary | ICD-10-CM

## 2016-01-05 DIAGNOSIS — F329 Major depressive disorder, single episode, unspecified: Secondary | ICD-10-CM | POA: Diagnosis not present

## 2016-01-05 DIAGNOSIS — E1122 Type 2 diabetes mellitus with diabetic chronic kidney disease: Secondary | ICD-10-CM | POA: Diagnosis not present

## 2016-01-05 DIAGNOSIS — I5022 Chronic systolic (congestive) heart failure: Secondary | ICD-10-CM

## 2016-01-05 DIAGNOSIS — F0150 Vascular dementia without behavioral disturbance: Secondary | ICD-10-CM | POA: Diagnosis not present

## 2016-01-05 DIAGNOSIS — E1169 Type 2 diabetes mellitus with other specified complication: Secondary | ICD-10-CM | POA: Diagnosis not present

## 2016-01-05 DIAGNOSIS — E785 Hyperlipidemia, unspecified: Secondary | ICD-10-CM | POA: Diagnosis not present

## 2016-01-05 DIAGNOSIS — R911 Solitary pulmonary nodule: Secondary | ICD-10-CM

## 2016-01-05 DIAGNOSIS — I480 Paroxysmal atrial fibrillation: Secondary | ICD-10-CM | POA: Diagnosis not present

## 2016-01-05 DIAGNOSIS — E1140 Type 2 diabetes mellitus with diabetic neuropathy, unspecified: Secondary | ICD-10-CM | POA: Diagnosis not present

## 2016-01-05 DIAGNOSIS — M109 Gout, unspecified: Secondary | ICD-10-CM | POA: Diagnosis not present

## 2016-01-05 NOTE — Progress Notes (Signed)
Patient ID: Casey [name], female   DOB: [birth year], 75 y.o.   MRN: [id number]    DATE: 01/05/2016  Location:    [facility] Room Number: 106 A Place of Service: SNF (31)   Extended Emergency Contact Information Primary Emergency Contact: Armstrong,Pat  [place] Phone: [phone] Relation: Daughter Secondary Emergency Contact: Alfonzo Beers, [place] Phone: [phone] Mobile Phone: [phone] Relation: Niece  Advanced Directive information Does Patient Have a Medical Advance Directive?: Yes, Type of Advance Directive: Out of facility DNR (pink MOST or yellow form), Pre-existing out of facility DNR order (yellow form or pink MOST form): Pink MOST form placed in chart (order not valid for inpatient use) ([employer] CPR)  Chief Complaint  Patient presents with  . Medical Management of Chronic Issues    Routine Visit    HPI:  75 yo female long term resident seen today for f/u. She has no concerns. No nursing issues. No falls. She is a poor historian due to dementia. Hx obtained from chart. 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 Cr 1.2 in Oct 2017  Gout - no recent flares will continue allopurinol 100 mg daily   Hypertension - stable on vasotec 2.5 mg daily; lopressor 50 mg twice daily  PAF - rate controlled on digoxin 125 mcg daily and lopressor 50 mg twice daily. Takes ASA 81 mg daily. Last Dig level is 0.68 in August 2017.  Dementia - stable on aricept 10 mg daily. She has weight loss and takes remeron  DM - stable on metformin 500 mg twice daily; A1c 5.6%. She is off lantus   Chronic systolic heart failure - EF is 35-40%; asymptomatic at this time  Dyslipidemia - stable on lipitor 10 mg daily; LDL 46; TG 133   Seizures - stable on keppra 250 mg twice daily   Urge incontinence - stable on ditropan 5 mg twice daily   Depression - stable on zoloft 50 mg daily    Pulmonary nodule - CT scans every 18  months;  Last CT scan was done Dec 2016 and stable.   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 K 5.5 in Oct 2017   Past Medical History:  Diagnosis Date  . Abdominal or pelvic swelling, mass, or lump, left upper quadrant   . CHF (congestive heart failure) ([place])   . Chronic atrial fibrillation ([place])   . Chronic kidney disease   . Congestive heart failure, unspecified   . Coronary atherosclerosis of unspecified type of vessel, native or graft   . Diabetes mellitus   . Edema   . Electrolyte and fluid disorders not elsewhere classified   . GERD (gastroesophageal reflux disease)   . Gout, unspecified   . Gout, unspecified   . Hypercholesterolemia   . Hypertension   . Hypopotassemia   . Hypopotassemia   . Long term (current) use of anticoagulants   . Long term (current) use of anticoagulants   . 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  Marland Kitchen  Peripheral vascular disease, unspecified   . Peripheral vascular disease, unspecified   . Seizures ([place])   . Sinus of Valsalva aneurysm    a. By 2D echo 05/2011.  . Stroke ([place])   . Unspecified disorder of kidney and ureter   . Unspecified hereditary and idiopathic peripheral neuropathy   . Unspecified hereditary and idiopathic peripheral neuropathy   . Unspecified late effects of cerebrovascular disease   . Unspecified late effects of cerebrovascular disease   . Unspecified urinary incontinence   . Urinary frequency   . Vascular dementia, uncomplicated     Past Surgical History:  Procedure Laterality Date  . AORTOGRAM  09/01/2008    Abd w/ bilateral lower extremity runoff arteriography  . FALSE ANEURYSM REPAIR  12/06/2011   Procedure: REPAIR FALSE ANEURYSM;  Surgeon: Angelia Mould, MD;  [facility] OR;  Service: Vascular;  Laterality: Left;  Repair of left femoral Artery pseudoaneurysm  . FEMORAL ARTERY EXPLORATION  12/06/2011   Procedure: FEMORAL ARTERY EXPLORATION;  Surgeon: Angelia Mould, MD;  Location: [facility] OR;  Service: Vascular;  Laterality: N/A;  Exploration of large pseudoaneurysm left side of fem-fem bypass graft   . femoral to femoral bypass graft     . FEMORAL-FEMORAL BYPASS GRAFT  12/06/2011   Procedure: BYPASS GRAFT FEMORAL-FEMORAL ARTERY;  Surgeon: Angelia Mould, MD;  Location[facility] OR;  Service: Vascular;  Laterality: Bilateral;  Revision of left to right Femoral-Femoral bypass graft  . FEMORAL-POPLITEAL BYPASS GRAFT Left 05/2002   lower extremity femoral to below knee w/ non-versed greater saphenous vein  . FEMORAL-POPLITEAL BYPASS GRAFT Left 11/2002  . TUBAL LIGATION Bilateral     Patient Care Team: Hennie Duos, MD as PCP - General (Internal Medicine) Gerlene Fee, NP as Nurse Practitioner (Geriatric Medicine) [facility] ([place])  Social History   Social History  . Marital status: Widowed    Spouse name: N/A  . Number of children: N/A  . Years of education: N/A   Occupational History  . Not on file.   Social History Main Topics  . Smoking status: Former Smoker    Types: Cigarettes    Quit date: 01/29/2001  . Smokeless tobacco: Never Used  . Alcohol use No  . Drug use: No  . Sexual activity: No   Other Topics Concern  . Not on file   Social History Narrative  . No narrative on file     reports that she quit smoking about 14 years ago. Her smoking use included Cigarettes. She has never used smokeless tobacco. She reports that she does not drink alcohol or use drugs.  Family History  Problem Relation Age of Onset  . Cancer  Mother     Cervical  . Stroke Mother   . Diabetes Mother   . Stroke Father   . Heart disease Father    Family Status  Relation Status  . Mother Deceased at age 38   Cause of Death: Complications of diabetes  . Father Deceased   Cause of Death: Heart disease  . Brother Alive  . Daughter Alive  . Son Alive  . Sister Deceased  . Brother Deceased  . Brother Deceased    Immunization History  Administered Date(s) Administered  . Influenza Split 12/09/2011  . Influenza-Unspecified 11/27/2013, 03/08/2015, 11/03/2015  . PPD Test 10/14/2015  . Pneumococcal Polysaccharide-23 02/05/2013    Allergies  Allergen Reactions  . Penicillins Other (See Comments)    Patient states that she was  told previously by a doctor to not take this medication but is unsure why.     Medications: Patient's Medications  New Prescriptions   No medications on file  Previous Medications   ALLOPURINOL (ZYLOPRIM) 100 MG TABLET    Take 100 mg by mouth daily.   AMINO ACIDS-PROTEIN HYDROLYS (FEEDING SUPPLEMENT, PRO-STAT SUGAR FREE 64,) LIQD    Take 30 mLs by mouth 2 (two) times daily.   ASPIRIN 81 MG TABLET    Take 81 mg by mouth daily.   ATORVASTATIN (LIPITOR) 10 MG TABLET    Take 10 mg by mouth daily at 6 PM.   DIGOXIN (LANOXIN) 0.125 MG TABLET    Take 0.125 mg by mouth daily. Check apical heart rate prior to each dose. Hold dose if HR is less than 60 and notify MD   DONEPEZIL (ARICEPT) 10 MG TABLET    Take 1 tablet (10 mg total) by mouth at bedtime.   ENALAPRIL (VASOTEC) 2.5 MG TABLET    Take 1 tablet (2.5 mg total) by mouth daily.   LEVETIRACETAM (KEPPRA) 250 MG TABLET    Take 250 mg by mouth 2 (two) times daily.   METFORMIN (GLUCOPHAGE) 500 MG TABLET    Take 500 mg by mouth 2 (two) times daily with a meal.   METOPROLOL (LOPRESSOR) 50 MG TABLET    Take 50 mg by mouth 2 (two) times daily. Hold if pulse  < 60   MIRTAZAPINE (REMERON) 7.5 MG TABLET    Take 7.5 mg by mouth at bedtime.   MULTIPLE  VITAMINS-MINERALS (DECUBI-VITE) CAPS    Take 1 capsule by mouth daily.   OXYBUTYNIN (DITROPAN) 5 MG TABLET    Take 1 tablet (5 mg total) by mouth 2 (two) times daily.   SERTRALINE (ZOLOFT) 50 MG TABLET    Take 50 mg by mouth daily.   TRAMADOL (ULTRAM) 50 MG TABLET    Take 1 tablet (50 mg total) by mouth every 8 (eight) hours as needed.   UNABLE TO FIND    Med Name: Med Pass 2 give 90 cc three times daily by mouth. HSG Mech Soft texture  Modified Medications   No medications on file  Discontinued Medications   No medications on file    Review of Systems  Unable to perform ROS: Dementia    Vitals:   01/05/16 1238  BP: (!) 142/83  Pulse: 71  Resp: 18  Temp: 98.1 F (36.7 C)  TempSrc: Oral  SpO2: 96%  Weight: 107 lb 14.4 oz (48.9 kg)  Height: 5\' 3"  (1.6 m)   Body mass index is 19.11 kg/m.  Physical Exam  Constitutional: She appears well-developed.  Frail appearing, sitting up in bed in NAD  HENT:  Mouth/Throat: Oropharynx is clear and moist. No oropharyngeal exudate.  Eyes: Pupils are equal, round, and reactive to light. No scleral icterus.  Blind OU but sees shadows  Neck: Neck supple. Carotid bruit is not present. No tracheal deviation present. No thyromegaly present.  Cardiovascular: Normal rate, regular rhythm and intact distal pulses.  Exam reveals no gallop and no friction rub.   Murmur (1/6 SEM) heard. Trace  LE edema b/l. no calf TTP.   Pulmonary/Chest: Effort normal and breath sounds normal. No stridor. No respiratory distress. She has no wheezes. She has no rales.  Abdominal: Soft. Bowel sounds are normal. She exhibits no distension and no mass. There is no hepatomegaly. There is no tenderness. There is no rebound and no guarding.  Musculoskeletal: She  exhibits edema.  Lymphadenopathy:       Head (right side): No preauricular and no posterior auricular adenopathy present.       Head (left side): No preauricular and no posterior auricular adenopathy present.     She has no cervical adenopathy.       Right: No supraclavicular adenopathy present.       Left: No supraclavicular adenopathy present.  Neurological: She is alert.  Skin: Skin is warm and dry. No rash noted.  Heel hard callus on left  Psychiatric: She has a normal mood and affect. Her behavior is normal.     Labs reviewed: Nursing Home on 01/05/2016  Component Date Value Ref Range Status  . Hemoglobin 11/10/2015 12.2  12.0 - 16.0 g/dL Final  . HCT 11/10/2015 37  36 - 46 % Final  . Platelets 11/10/2015 209  150 - 399 K/L Final  . WBC 11/10/2015 6.7  10^3/mL Final  . Glucose 11/10/2015 83  mg/dL Final  . BUN 11/10/2015 24* 4 - 21 mg/dL Final  . Creatinine 11/10/2015 1.2* 0.5 - 1.1 mg/dL Final  . Potassium 11/10/2015 5.5* 3.4 - 5.3 mmol/L Final  . Sodium 11/10/2015 144  137 - 147 mmol/L Final  . Triglycerides 11/10/2015 131  40 - 160 mg/dL Final  . Cholesterol 11/10/2015 117  0 - 200 mg/dL Final  . HDL 11/10/2015 50  35 - 70 mg/dL Final  . LDL Cholesterol 11/10/2015 41  mg/dL Final  . Alkaline Phosphatase 11/10/2015 86  25 - 125 U/L Final  . ALT 11/10/2015 24  7 - 35 U/L Final  . AST 11/10/2015 26  13 - 35 U/L Final  . Bilirubin, Total 11/10/2015 0.3  mg/dL Final  Nursing Home on 10/25/2015  Component Date Value Ref Range Status  . Glucose 09/20/2015 85  mg/dL Final  . BUN 09/20/2015 27* 4 - 21 mg/dL Final  . Creatinine 09/20/2015 1.0  0.5 - 1.1 mg/dL Final  . Potassium 09/20/2015 4.5  3.4 - 5.3 mmol/L Final  . Sodium 09/20/2015 144  137 - 147 mmol/L Final  . Digoxin Level 10/25/2015 0.68   Final  . Magnesium 10/25/2015 1.7   Final  . Hemoglobin A1C 09/14/2015 5.6   Final    No results found.   Assessment/Plan   ICD-9-CM ICD-10-CM   1. Vascular dementia without behavioral disturbance 290.40 F01.50   2. Seizures (HCC) 780.39 R56.9   3. Paroxysmal atrial fibrillation (HCC) 427.31 I48.0   4. Type 2 diabetes mellitus with diabetic neuropathy, without long-term current use  of insulin (HCC) 250.60 E11.40    357.2    5. Dyslipidemia associated with type 2 diabetes mellitus (HCC) 250.80 E11.69    272.4 E78.5   6. Chronic systolic congestive heart failure (HCC) 428.22 I50.22    428.0    7. Gout of multiple sites, unspecified cause, unspecified chronicity 274.9 M10.9   8. Loss of weight 783.21 R63.4   9. Solitary pulmonary nodule 793.11 R91.1   10. CKD stage 3 due to type 2 diabetes mellitus (HCC) 250.40 E11.22    585.3 N18.3      No change in tx  Wound care as indicated  Cont current meds as ordered  PT/OT/ST as indicated  Cont nutritional supplements as indicated  Will follow  Nihal Marzella S. Perlie Gold  [facility] [address] [place], [place] [address] [phone] Cell (Monday-Friday 8 AM - 5 PM) [phone] After 5  PM and follow prompts

## 2016-02-02 ENCOUNTER — Non-Acute Institutional Stay (SKILLED_NURSING_FACILITY): Payer: Medicare Other | Admitting: Internal Medicine

## 2016-02-02 DIAGNOSIS — R899 Unspecified abnormal finding in specimens from other organs, systems and tissues: Secondary | ICD-10-CM

## 2016-02-02 DIAGNOSIS — R195 Other fecal abnormalities: Secondary | ICD-10-CM

## 2016-02-03 LAB — BASIC METABOLIC PANEL
BUN: 29 — AB (ref 4–21)
CREATININE: 1.1 (ref 0.5–1.1)
GLUCOSE: 101
Potassium: 4.5 (ref 3.4–5.3)
SODIUM: 142 (ref 137–147)

## 2016-02-03 LAB — HEPATIC FUNCTION PANEL
ALK PHOS: 76 (ref 25–125)
ALT: 20 (ref 7–35)
AST: 27 (ref 13–35)
Bilirubin, Total: 0.3

## 2016-02-05 NOTE — Progress Notes (Signed)
This is an acute visit.  Level care skilled.  Facility golden [facility] complaint-acute visit secondary to foul-smelling stool.  History of present illness.  Patient is a 76 year old female who nursing staff has left a note stating that at times she has foul-smelling stool.  She is a poor historian secondary to dementia but she is denying any abdominal pain there's been no nausea or vomiting she appears to be resting in bed comfortably vital signs appear to be stable.  Past Medical History:  Diagnosis Date  . Abdominal or pelvic swelling, mass, or lump, left upper quadrant   . CHF (congestive heart failure) ([place])   . Chronic atrial fibrillation ([place])   . Chronic kidney disease   . Congestive heart failure, unspecified   . Coronary atherosclerosis of unspecified type of vessel, native or graft   . Diabetes mellitus   . Edema   . Electrolyte and fluid disorders not elsewhere classified   . GERD (gastroesophageal reflux disease)   . Gout, unspecified   . Gout, unspecified   . Hypercholesterolemia   . Hypertension   . Hypopotassemia   . Hypopotassemia   . Long term (current) use of anticoagulants   . Long term (current) use of anticoagulants   . 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  . Peripheral vascular disease, unspecified   . Peripheral vascular disease, unspecified   . Seizures ([place])   . Sinus of Valsalva aneurysm    a. By 2D echo 05/2011.  . Stroke ([place])   . Unspecified disorder of kidney and ureter   . Unspecified hereditary and idiopathic peripheral neuropathy   . Unspecified hereditary and idiopathic peripheral neuropathy   . Unspecified late effects of cerebrovascular disease   . Unspecified late effects of cerebrovascular disease   . Unspecified urinary incontinence   . Urinary frequency    . Vascular dementia, uncomplicated          Past Surgical History:  Procedure Laterality Date  . AORTOGRAM  09/01/2008   Abd w/ bilateral lower extremity runoff arteriography  . FALSE ANEURYSM REPAIR  12/06/2011   Procedure: REPAIR FALSE ANEURYSM; Surgeon: Angelia Mould, MD; Location: [facility] OR; Service: Vascular; Laterality: Left; Repair of left femoral Artery pseudoaneurysm  . FEMORAL ARTERY EXPLORATION  12/06/2011   Procedure: FEMORAL ARTERY EXPLORATION; Surgeon: Angelia Mould, MD; Location: [facility] OR; Service: Vascular; Laterality: N/A; Exploration of large pseudoaneurysm left side of fem-fem bypass graft   . femoral to femoral bypass graft     . FEMORAL-FEMORAL BYPASS GRAFT  12/06/2011   Procedure: BYPASS GRAFT FEMORAL-FEMORAL ARTERY; Surgeon: Angelia Mould, MD; Location: [facility] OR; Service: Vascular; Laterality: Bilateral; Revision of left to right Femoral-Femoral bypass graft  . FEMORAL-POPLITEAL BYPASS GRAFT Left 05/2002   lower extremity femoral to below knee w/ non-versed greater saphenous vein  . FEMORAL-POPLITEAL BYPASS GRAFT Left 11/2002  . TUBAL LIGATION Bilateral     Social History        Social History  . Marital status: Widowed    Spouse name: N/A  . Number of children: N/A  . Years of education: N/A      Occupational History  . Not on file.        Social History Main Topics  . Smoking status: Former Smoker    Types: Cigarettes    Quit date: 01/29/2001  . Smokeless tobacco: Never  Used  . Alcohol use No  . Drug use: No  . Sexual activity: No       Other Topics Concern  . Not on file      Social History Narrative  . No narrative on file         Family History  Problem Relation Age of Onset  . Cancer Mother     Cervical  . Stroke Mother   . Diabetes Mother   . Stroke Father   . Heart disease Father       VITAL SIGNS  She is afebrile pulse 76  respirations 18 blood pressure 132/60      Patient's Medications  New Prescriptions   No medications on file  Previous Medications   ALLOPURINOL (ZYLOPRIM) 100 MG TABLET Take 100 mg by mouth daily.   ASPIRIN 81 MG TABLET Take 81 mg by mouth daily.   ATORVASTATIN (LIPITOR) 10 MG TABLET Take 10 mg by mouth daily at 6 PM.   DIGOXIN (LANOXIN) 0.125 MG TABLET Take 0.125 mg by mouth daily. Check apical heart rate prior to each dose. Hold dose if HR is less than 60 and notify MD   DONEPEZIL (ARICEPT) 10 MG TABLET Take 1 tablet (10 mg total) by mouth at bedtime.   ENALAPRIL (VASOTEC) 2.5 MG TABLET Take 1 tablet (2.5 mg total) by mouth daily.   LEVETIRACETAM (KEPPRA) 250 MG TABLET Take 250 mg by mouth 2 (two) times daily.   METFORMIN (GLUCOPHAGE) 500 MG TABLET Take 500 mg by mouth 2 (two) times daily with a meal.   METOPROLOL (LOPRESSOR) 50 MG TABLET Take 50 mg by mouth 2 (two) times daily. Hold if pulse <60   MIRTAZAPINE (REMERON) 7.5 MG TABLET Take 7.5 mg by mouth at bedtime.   MULTIPLE VITAMINS-MINERALS (DECUBI-VITE) CAPS Take 1 capsule by mouth daily.   OXYBUTYNIN (DITROPAN) 5 MG TABLET Take 1 tablet (5 mg total) by mouth 2 (two) times daily.   SERTRALINE (ZOLOFT) 50 MG TABLET Take 50 mg by mouth daily.   TRAMADOL (ULTRAM) 50 MG TABLET Take 1 tablet (50 mg total) by mouth every 8 (eight) hours as needed.   UNABLE TO FIND Med Name: Med Pass 2 give 90 cc three times daily by mouth. HSG Mech Soft texture  Modified Medications   No medications on file  Discontinued Medications             SIGNIFICANT DIAGNOSTIC EXAMS  05-21-14: ct of chest: 1. Right upper lobe 5 mm nodule unchanged. 2. Additional nodules identified, some of which are noncalcified andvalso warrants follow-up. 3. Persistent mediastinal and hilar adenopathy. This may bevlong-standing given the previous reports of adenopathy. At the [facility] next exam,  consider contrast-enhanced exam for further evaluation of the lymph nodes. 4. Given patient's history of smoking, follow-up chest CT at 6-12vmonths is recommended. 5. Coronary artery disease. . 01-10-15: ct of chest; 1. No acute findings. 2. Stable lung nodules as detailed above, the largest in the right upper lobe measuring 5 mm. Recommend additional unenhanced chest CT follow-up and 18 months per Fleischner criteria for following pulmonary nodules.    LABS REVIEWED:   11-11-14: dig 1.01 10-15-14: wbc 5.6 ;hgb 12.5; hct 41.8; mcv 89.9; plt 209; glucose 88; bun 17.6; creat 0.98; k+ 4.8; na++144; liver normal albumin 4.0; chol 152; ;ldl 75; trig 159; hdl 45  12-31-14: hgb a1c 6.1  05-05-15: wbc 7.2; hgb 12.2; hct 37.2; mcv 88.5; plt 202; glucose 74; bun 59.2; creat 1.60; k+ 4.5; na++  142; chol 126; ldl 46; trig 133; hdl 54; dig 1.49; tsh 0.86; hgb a1c 5.6  05-10-15: glucose 69; bun 37.8; creat 1.24; k+ 4.5; na++ 145  09-14-15: hgb a1c 5.6 09-20-15: glucose 85; bun 26.8; creat 1.04; k+ 4.5; na++144; mag 1.7; dig 0.68  11/10/2015.  WBC 6.7 hemoglobin 12.2 platelets 209.  Sodium 144 potassium 5.5 BUN 24.1 creatinine 1.24.  Liver function tests within normal limits   Review of Systems Unable to perform [id number] see history of present illness     Physical Exam Constitutional: No distress. Lying comfortably in bed  Thin she is pleasant and cooperative with exam Neck: Neck supple. No JVD present. No thyromegaly present.  Cardiovascular: Normal rate  ,irregular  rhythm and intact distal pulses.  Respiratory: Effort normal and breath sounds normal. No respiratory distress.  GI: Soft. Bowel sounds are normal. She exhibits no distension. There is no tenderness.   Musculoskeletal: She exhibits no edema.  Is able to move left extremities Has right hemiparesis  Neurological: She is alert-pleasant and cooperative- Eyes she is legally blind---  Oropharynx is clear  mucous membranes moist Skin: Skin is warm and dry. She is not diaphoretic.   Assessment and plan.  #1 foul smelling  stool-physical exam was quite benign-will order a C. difficile culture however to rule out C. difficile I do not see recent antibiotic use at this point monitor-- if stool persists consider other laboratory measures if C. difficile comes back negative.  Also will update a metabolic panel to check on electrolytes-I note potassium on lab in October was slightly elevated at 5.5 I do not see an updated lab although this may have been done - but will order this to make sure that electrolytes are within normal limits She is on an ACE inhibitor- apparently she's had hypokalemia in the past--  Blood pressure appears to stay stable at times she's had systolic spikes but I suspect this is more agitation related per discussion with nursing  Continue to monitor clinical status but at this point she appears to be quite stable.  Also this foul-smelling stool is consistently loose consider looking at Glucophage as a cause as well  CPT 99309  \Of note greater than 25 minutes spent assessing patient-reviewing her chart-reviewing her labs-discussing her status with nursing staff-and coordinating and formulating a plan of care-

## 2016-02-07 ENCOUNTER — Non-Acute Institutional Stay (SKILLED_NURSING_FACILITY): Payer: Medicare Other | Admitting: Adult Health

## 2016-02-07 DIAGNOSIS — I5022 Chronic systolic (congestive) heart failure: Secondary | ICD-10-CM | POA: Diagnosis not present

## 2016-02-07 DIAGNOSIS — E1169 Type 2 diabetes mellitus with other specified complication: Secondary | ICD-10-CM | POA: Diagnosis not present

## 2016-02-07 DIAGNOSIS — R569 Unspecified convulsions: Secondary | ICD-10-CM | POA: Diagnosis not present

## 2016-02-07 DIAGNOSIS — E785 Hyperlipidemia, unspecified: Secondary | ICD-10-CM

## 2016-02-07 DIAGNOSIS — F0150 Vascular dementia without behavioral disturbance: Secondary | ICD-10-CM

## 2016-02-07 DIAGNOSIS — I110 Hypertensive heart disease with heart failure: Secondary | ICD-10-CM | POA: Diagnosis not present

## 2016-02-07 DIAGNOSIS — E1140 Type 2 diabetes mellitus with diabetic neuropathy, unspecified: Secondary | ICD-10-CM

## 2016-02-07 DIAGNOSIS — I6990 Unspecified sequelae of unspecified cerebrovascular disease: Secondary | ICD-10-CM

## 2016-02-07 DIAGNOSIS — E1122 Type 2 diabetes mellitus with diabetic chronic kidney disease: Secondary | ICD-10-CM

## 2016-02-07 DIAGNOSIS — N183 Chronic kidney disease, stage 3 (moderate): Secondary | ICD-10-CM | POA: Diagnosis not present

## 2016-02-07 DIAGNOSIS — I480 Paroxysmal atrial fibrillation: Secondary | ICD-10-CM

## 2016-02-18 ENCOUNTER — Encounter: Payer: Self-pay | Admitting: Adult Health

## 2016-02-18 NOTE — Progress Notes (Signed)
Location:   Psychiatrist of Service:  SNF ([id number])   CODE STATUS: full code   Allergies  Allergen Reactions  . Penicillins Other (See Comments)    Patient states that she was told previously by a doctor to not take this medication but is unsure why.     Chief Complaint  Patient presents with  . Medical Management of Chronic Issues    HPI:  She is a long term resident of this facility being seen for the management of her chronic illnesses. There is no significant change in her status. She is unable to fully participate in the hpi or ros. There are no nursing concerns at this time.    Past Medical History:  Diagnosis Date  . Abdominal or pelvic swelling, mass, or lump, left upper quadrant   . CHF (congestive heart failure) ([place])   . Chronic atrial fibrillation ([place])   . Chronic kidney disease   . Congestive heart failure, unspecified   . Coronary atherosclerosis of unspecified type of vessel, native or graft   . Diabetes mellitus   . Edema   . Electrolyte and fluid disorders not elsewhere classified   . GERD (gastroesophageal reflux disease)   . Gout, unspecified   . Gout, unspecified   . Hypercholesterolemia   . Hypertension   . Hypopotassemia   . Hypopotassemia   . Long term (current) use of anticoagulants   . Long term (current) use of anticoagulants   . 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  . Peripheral vascular disease, unspecified   . Peripheral vascular disease, unspecified   . Seizures ([place])   . Sinus of Valsalva aneurysm    a. By 2D echo 05/2011.  . Stroke ([place])   . Unspecified disorder of kidney and ureter   . Unspecified hereditary and idiopathic peripheral neuropathy   . Unspecified hereditary and idiopathic peripheral neuropathy   . Unspecified late effects of cerebrovascular disease   . Unspecified late effects of  cerebrovascular disease   . Unspecified urinary incontinence   . Urinary frequency   . Vascular dementia, uncomplicated     Past Surgical History:  Procedure Laterality Date  . AORTOGRAM  09/01/2008   Abd w/ bilateral lower extremity runoff arteriography  . FALSE ANEURYSM REPAIR  12/06/2011   Procedure: REPAIR FALSE ANEURYSM;  Surgeon: Angelia Mould, MD;  Location: [facility] OR;  Service: Vascular;  Laterality: Left;  Repair of left femoral Artery pseudoaneurysm  . FEMORAL ARTERY EXPLORATION  12/06/2011   Procedure: FEMORAL ARTERY EXPLORATION;  Surgeon: Angelia Mould, MD;  Location: [facility] OR;  Service: Vascular;  Laterality: N/A;  Exploration of large pseudoaneurysm left side of fem-fem bypass graft   . femoral to femoral bypass graft     . FEMORAL-FEMORAL BYPASS GRAFT  12/06/2011   Procedure: BYPASS GRAFT FEMORAL-FEMORAL ARTERY;  Surgeon: Angelia Mould, MD;  Location: [facility] OR;  Service: Vascular;  Laterality: Bilateral;  Revision of left to right Femoral-Femoral bypass graft  . FEMORAL-POPLITEAL BYPASS GRAFT Left 05/2002   lower extremity femoral to below knee w/ non-versed greater saphenous vein  . FEMORAL-POPLITEAL BYPASS GRAFT Left 11/2002  . TUBAL LIGATION Bilateral     Social History   Social History  . Marital status: Widowed    Spouse name: N/A  . Number of children: N/A  . Years of education: N/A  Occupational History  . Not on file.   Social History Main Topics  . Smoking status: Former Smoker    Types: Cigarettes    Quit date: 01/29/2001  . Smokeless tobacco: Never Used  . Alcohol use No  . Drug use: No  . Sexual activity: No   Other Topics Concern  . Not on file   Social History Narrative  . No narrative on file   Family History  Problem Relation Age of Onset  . Cancer Mother     Cervical  . Stroke Mother   . Diabetes Mother   . Stroke Father   . Heart disease Father       VITAL SIGNS BP 114/74   Pulse 62   Temp 97.4 F (36.3 C)    Resp 16   Ht 5' (1.524 m)   Wt 109 lb 12.8 oz (49.8 kg)   SpO2 96%   BMI 21.44 kg/m   Patient's Medications  New Prescriptions   No medications on file  Previous Medications   ALLOPURINOL (ZYLOPRIM) 100 MG TABLET    Take 100 mg by mouth daily.   AMINO ACIDS-PROTEIN HYDROLYS (FEEDING SUPPLEMENT, PRO-STAT SUGAR FREE 64,) LIQD    Take 30 mLs by mouth 2 (two) times daily.   ASPIRIN 81 MG TABLET    Take 81 mg by mouth daily.   ATORVASTATIN (LIPITOR) 10 MG TABLET    Take 10 mg by mouth daily at 6 PM.   DIGOXIN (LANOXIN) 0.125 MG TABLET    Take 0.125 mg by mouth daily. Check apical heart rate prior to each dose. Hold dose if HR is less than 60 and notify MD   DONEPEZIL (ARICEPT) 10 MG TABLET    Take 1 tablet (10 mg total) by mouth at bedtime.   ENALAPRIL (VASOTEC) 2.5 MG TABLET    Take 1 tablet (2.5 mg total) by mouth daily.   LEVETIRACETAM (KEPPRA) 250 MG TABLET    Take 250 mg by mouth 2 (two) times daily.   METFORMIN (GLUCOPHAGE) 500 MG TABLET    Take 500 mg by mouth 2 (two) times daily with a meal.   METOPROLOL (LOPRESSOR) 50 MG TABLET    Take 50 mg by mouth 2 (two) times daily. Hold if pulse  < 60   MIRTAZAPINE (REMERON) 7.5 MG TABLET    Take 7.5 mg by mouth at bedtime.   MULTIPLE VITAMINS-MINERALS (DECUBI-VITE) CAPS    Take 1 capsule by mouth daily.   OXYBUTYNIN (DITROPAN) 5 MG TABLET    Take 1 tablet (5 mg total) by mouth 2 (two) times daily.   SERTRALINE (ZOLOFT) 50 MG TABLET    Take 50 mg by mouth daily.   TRAMADOL (ULTRAM) 50 MG TABLET    Take 1 tablet (50 mg total) by mouth every 8 (eight) hours as needed.   UNABLE TO FIND    Med Name: Med Pass 2 give 90 cc three times daily by mouth. HSG Mech Soft texture  Modified Medications   No medications on file  Discontinued Medications   No medications on file     SIGNIFICANT DIAGNOSTIC EXAMS   05-21-14: ct of chest: 1. Right upper lobe 5 mm nodule unchanged. 2. Additional nodules identified, some of which are noncalcified andvalso  warrants follow-up. 3. Persistent mediastinal and hilar adenopathy. This may bevlong-standing given the previous reports of adenopathy. At the [facility] next exam, consider contrast-enhanced exam for further evaluation of the lymph nodes. 4. Given patient's history of smoking, follow-up chest  CT at 6-12vmonths is recommended. 5. Coronary artery disease. . 01-10-15: ct of chest; 1. No acute findings. 2. Stable lung nodules as detailed above, the largest in the right upper lobe measuring 5 mm. Recommend additional unenhanced chest CT follow-up and 18 months per Fleischner criteria for following pulmonary nodules.      LABS REVIEWED:    05-05-15: wbc 7.2; hgb 12.2; hct 37.2; mcv 88.5; plt 202; glucose 74; bun 59.2; creat 1.60; k+ 4.5; na++ 142; chol 126; ldl 46; trig 133; hdl 54; dig 1.49; tsh 0.86; hgb a1c 5.6  05-10-15: glucose 69; bun 37.8; creat 1.24; k+ 4.5; na++ 145  09-14-15: hgb a1c 5.6 09-20-15: glucose 85; bun 26.8; creat 1.04; k+ 4.5; na++144; mag 1.7; dig 0.68  11-10-15; wbc 6.7 hgb 12.2; hct 37.3; mcv 86.0; plt 209; gucose 83; bun 24.1; creat 1.24; k+ 5.5; na++ 144; (creat clear 31.49); liver normal albumin 3.8; chol 117; ldl 41; trig 131; hdl 50   Urine micro-albumin <1.2  02-03-16: wbc 6.5; hgb 11.8; hct 38.9;mcv 92.7; plt 171; glucose 101; bun 29.2; creat 1.14; k+ 4.5; na++ 147; ca 10.4; (creat clear 34.25); liver normal albumin 3.8      Review of Systems Unable to perform ROS: Dementia      Physical Exam Constitutional: No distress.  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 No JVD present. No thyromegaly present.  Cardiovascular: Normal rate, regular rhythm and intact distal pulses.   Respiratory: Effort normal and breath sounds normal. No respiratory distress.  GI: Soft. Bowel sounds are normal. She exhibits no distension. There is no tenderness.  Musculoskeletal: She exhibits no edema.  Is able to move left extremities Has right hemiparesis    Neurological: She is  alert.  Skin: Skin is warm and dry. She is not diaphoretic.     ASSESSMENT/ PLAN:  1. Gout no recent flares will continue allopurinol 100 mg daily   2. Hypertension: will continue  vasotec  2.5  mg daily will continue  lopressor 50 mg twice daily   3. Afib: heart rate regular will continue  Asa 81 mg daily with digoxin 125 mcg daily dig level is 0.68; lopressor 50 mg twice daily for rate control.   4. Dementia: is without change will continue aricept 10 mg daily; will monitor her weight is 109 pounds will need to continue to monitor   5. Diabetes:  hgb a1c is 5.6  She is off lantus  Will stop metformin due to renal function and will monitor   6. Chronic systolic heart failure:  Ef is 35-40%; is stable will not make changes will monitor   7. Dyslipidemia: will continue lipitor 10 mg daily ldl is 41; trig 131   8. Seizures: no reports of recent activity present; will continue keppra 250 mg twice daily  9. UI: will continue ditropan 5 mg twice daily   10. Depression: will continue zoloft 50 mg daily   11. Pulmonary nodule: will continue ct scans every 18  months and will monitor her status.   Her ct scan was done Dec 2016 and stable.   12. CVA: is neurologically stable; has right hemiparesis; will continue asa 81 mg daily   13. Weight loss: her current weight is 109 pounds; will continue remeron 7.5 mg nightly  will continue supplements per facility protocol and will monitor  14. Hypokalemia:  Is off supplement will monitor   15. Stage IV chronic kidney disease: creat clear 34.25; bun 29.2; creat 1.14  MD is aware of resident's narcotic use and is in agreement with current plan of care. We will attempt to wean resident as apropriate   Ok Edwards NP [facility]  Contact [phone] Monday through Friday 8am- 5pm  After hours call [phone]

## 2016-03-20 ENCOUNTER — Non-Acute Institutional Stay (SKILLED_NURSING_FACILITY): Payer: Medicare Other | Admitting: Adult Health

## 2016-03-20 ENCOUNTER — Encounter: Payer: Self-pay | Admitting: Adult Health

## 2016-03-20 DIAGNOSIS — E1122 Type 2 diabetes mellitus with diabetic chronic kidney disease: Secondary | ICD-10-CM

## 2016-03-20 DIAGNOSIS — I110 Hypertensive heart disease with heart failure: Secondary | ICD-10-CM | POA: Diagnosis not present

## 2016-03-20 DIAGNOSIS — F0150 Vascular dementia without behavioral disturbance: Secondary | ICD-10-CM

## 2016-03-20 DIAGNOSIS — I480 Paroxysmal atrial fibrillation: Secondary | ICD-10-CM

## 2016-03-20 DIAGNOSIS — E785 Hyperlipidemia, unspecified: Secondary | ICD-10-CM

## 2016-03-20 DIAGNOSIS — I5022 Chronic systolic (congestive) heart failure: Secondary | ICD-10-CM

## 2016-03-20 DIAGNOSIS — N183 Chronic kidney disease, stage 3 (moderate): Secondary | ICD-10-CM

## 2016-03-20 DIAGNOSIS — E1169 Type 2 diabetes mellitus with other specified complication: Secondary | ICD-10-CM

## 2016-03-20 DIAGNOSIS — E1140 Type 2 diabetes mellitus with diabetic neuropathy, unspecified: Secondary | ICD-10-CM

## 2016-03-20 DIAGNOSIS — I70213 Atherosclerosis of native arteries of extremities with intermittent claudication, bilateral legs: Secondary | ICD-10-CM | POA: Diagnosis not present

## 2016-03-20 DIAGNOSIS — H548 Legal blindness, as defined in USA: Secondary | ICD-10-CM

## 2016-03-20 DIAGNOSIS — R569 Unspecified convulsions: Secondary | ICD-10-CM

## 2016-03-20 NOTE — Progress Notes (Signed)
Provider:   Location:  [facility] Room Number: 106 A Place of Service:  SNF (31)   PCP: Gildardo Cranker, DO Patient Care Team: Gildardo Cranker, DO as PCP - General (Internal Medicine) Gerlene Fee, NP as Nurse Practitioner (Geriatric Medicine) [facility] ([place])  Extended Emergency Contact Information Primary Emergency Contact: Judd Lien of [place] Phone: [phone] Relation: Daughter Secondary Emergency Contact: Alfonzo Beers, [place] of [place] Phone: [phone] Mobile Phone: [phone] Relation: Niece  Code Status: Full Code Goals of Care: Advanced Directive information Advanced Directives 03/20/2016  Does Patient Have a Medical Advance Directive? Yes  Type of Advance Directive Out of facility DNR (pink MOST or yellow form)  Does patient want to make changes to medical advance directive? No - Patient declined  Copy of [place] in Chart? -  Would patient like information on creating a medical advance directive? -  Pre-existing out of facility DNR order (yellow form or pink MOST form) Pink MOST form placed in chart (order not valid for inpatient use)      Allergies  Allergen Reactions  . Penicillins Other (See Comments)    Patient states that she was told previously by a doctor to not take this medication but is unsure why.      Chief Complaint  Patient presents with  . Annual Exam    HPI: Patient is a 76 y.o. female seen today for an annual comprehensive examination. There has been little change in her status. She has not been hospitalized over the past year. She is unable to fully participate in the hpi or ros. There are no nursing concerns at this time.   Past Medical History:  Diagnosis Date  . Abdominal or pelvic swelling, mass, or lump, left upper quadrant   . CHF (congestive heart failure) ([place])   . Chronic atrial  fibrillation ([place])   . Chronic kidney disease   . Congestive heart failure, unspecified   . Coronary atherosclerosis of unspecified type of vessel, native or graft   . Diabetes mellitus   . Edema   . Electrolyte and fluid disorders not elsewhere classified   . GERD (gastroesophageal reflux disease)   . Gout, unspecified   . Gout, unspecified   . Hypercholesterolemia   . Hypertension   . Hypopotassemia   . Hypopotassemia   . Long term (current) use of anticoagulants   . Long term (current) use of anticoagulants   . 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  . Peripheral vascular disease, unspecified   . Peripheral vascular disease, unspecified   . Seizures ([place])   . Sinus of Valsalva aneurysm    a. By 2D echo 05/2011.  . Stroke ([place])   . Unspecified disorder of kidney and ureter   . Unspecified hereditary and idiopathic peripheral neuropathy   . Unspecified hereditary and idiopathic peripheral neuropathy   . Unspecified late effects of cerebrovascular disease   . Unspecified late effects of cerebrovascular disease   . Unspecified urinary incontinence   . Urinary frequency   . Vascular dementia, uncomplicated    Past Surgical History:  Procedure Laterality Date  . AORTOGRAM  09/01/2008   Abd w/ bilateral lower extremity runoff arteriography  . FALSE ANEURYSM REPAIR  12/06/2011   Procedure: REPAIR FALSE ANEURYSM;  Surgeon:  Angelia Mould, MD;  Location: [facility] OR;  Service: Vascular;  Laterality: Left;  Repair of left femoral Artery pseudoaneurysm  . FEMORAL ARTERY EXPLORATION  12/06/2011   Procedure: FEMORAL ARTERY EXPLORATION;  Surgeon: Angelia Mould, MD;  Location: [facility] OR;  Service: Vascular;  Laterality: N/A;  Exploration of large pseudoaneurysm left side of fem-fem bypass graft   . femoral to femoral bypass graft     . FEMORAL-FEMORAL BYPASS GRAFT   12/06/2011   Procedure: BYPASS GRAFT FEMORAL-FEMORAL ARTERY;  Surgeon: Angelia Mould, MD;  Location: [facility] OR;  Service: Vascular;  Laterality: Bilateral;  Revision of left to right Femoral-Femoral bypass graft  . FEMORAL-POPLITEAL BYPASS GRAFT Left 05/2002   lower extremity femoral to below knee w/ non-versed greater saphenous vein  . FEMORAL-POPLITEAL BYPASS GRAFT Left 11/2002  . TUBAL LIGATION Bilateral     reports that she quit smoking about 15 years ago. Her smoking use included Cigarettes. She has never used smokeless tobacco. She reports that she does not drink alcohol or use drugs. Social History   Social History  . Marital status: Widowed    Spouse name: N/A  . Number of children: N/A  . Years of education: N/A   Occupational History  . Not on file.   Social History Main Topics  . Smoking status: Former Smoker    Types: Cigarettes    Quit date: 01/29/2001  . Smokeless tobacco: Never Used  . Alcohol use No  . Drug use: No  . Sexual activity: No   Other Topics Concern  . Not on file   Social History Narrative  . No narrative on file   Family History  Problem Relation Age of Onset  . Cancer Mother     Cervical  . Stroke Mother   . Diabetes Mother   . Stroke Father   . Heart disease Father    Vitals:   03/20/16 1119  BP: (!) 169/82  Pulse: (!) 57  Resp: 18  Temp: 98 F (36.7 C)  SpO2: 96%  Weight: 109 lb (49.4 kg)  Height: 5' (1.524 m)      Medication Sig  . allopurinol (ZYLOPRIM) 100 MG tablet Take 100 mg by mouth daily.  . Amino Acids-Protein Hydrolys (FEEDING SUPPLEMENT, PRO-STAT SUGAR FREE 64,) LIQD Take 30 mLs by mouth 2 (two) times daily.  Marland Kitchen aspirin 81 MG tablet Take 81 mg by mouth daily.  Marland Kitchen atorvastatin (LIPITOR) 10 MG tablet Take 10 mg by mouth daily at 6 PM.  . digoxin (LANOXIN) 0.125 MG tablet Take 0.125 mg by mouth daily. Check apical heart rate prior to each dose. Hold dose if HR is less than 60 and notify MD  . donepezil (ARICEPT) 10  MG tablet Take 1 tablet (10 mg total) by mouth at bedtime.  . enalapril (VASOTEC) 2.5 MG tablet Take 1 tablet (2.5 mg total) by mouth daily.  Marland Kitchen levETIRAcetam (KEPPRA) 250 MG tablet Take 250 mg by mouth 2 (two) times daily.  . metFORMIN (GLUCOPHAGE) 500 MG tablet Take 500 mg by mouth 2 (two) times daily with a meal.  . metoprolol (LOPRESSOR) 50 MG tablet Take 50 mg by mouth 2 (two) times daily. Hold if pulse  < 60  . mirtazapine (REMERON) 7.5 MG tablet Take 7.5 mg by mouth at bedtime.  Marland Kitchen oxybutynin (DITROPAN) 5 MG tablet Take 1 tablet (5 mg total) by mouth 2 (two) times daily.  . sertraline (ZOLOFT) 50 MG tablet Take 50 mg by mouth daily.  Marland Kitchen  UNABLE TO FIND Med Name: Med Pass 2 give 90 cc three times daily by mouth. HSG Mech Soft texture   No current facility-administered medications on file prior to visit.       SIGNIFICANT DIAGNOSTIC EXAMS  05-21-14: ct of chest: 1. Right upper lobe 5 mm nodule unchanged. 2. Additional nodules identified, some of which are noncalcified andvalso warrants follow-up. 3. Persistent mediastinal and hilar adenopathy. This may bevlong-standing given the previous reports of adenopathy. At the [facility] next exam, consider contrast-enhanced exam for further evaluation of the lymph nodes. 4. Given patient's history of smoking, follow-up chest CT at 6-12vmonths is recommended. 5. Coronary artery disease. . 01-10-15: ct of chest; 1. No acute findings. 2. Stable lung nodules as detailed above, the largest in the right upper lobe measuring 5 mm. Recommend additional unenhanced chest CT follow-up and 18 months per Fleischner criteria for following pulmonary nodules.      LABS REVIEWED:    05-05-15: wbc 7.2; hgb 12.2; hct 37.2; mcv 88.5; plt 202; glucose 74; bun 59.2; creat 1.60; k+ 4.5; na++ 142; chol 126; ldl 46; trig 133; hdl 54; dig 1.49; tsh 0.86; hgb a1c 5.6  05-10-15: glucose 69; bun 37.8; creat 1.24; k+ 4.5; na++ 145  09-14-15: hgb a1c 5.6 09-20-15: glucose 85; bun  26.8; creat 1.04; k+ 4.5; na++144; mag 1.7; dig 0.68  11-10-15; wbc 6.7 hgb 12.2; hct 37.3; mcv 86.0; plt 209; gucose 83; bun 24.1; creat 1.24; k+ 5.5; na++ 144; (creat clear 31.49); liver normal albumin 3.8; chol 117; ldl 41; trig 131; hdl 50   Urine micro-albumin <1.2  02-03-16: wbc 6.5; hgb 11.8; hct 38.9;mcv 92.7; plt 171; glucose 101; bun 29.2; creat 1.14; k+ 4.5; na++ 147; ca 10.4; (creat clear 34.25); liver normal albumin 3.8      Review of Systems Unable to perform ROS: Dementia     Physical Exam  Constitutional: No distress.  Frail   Eyes: Conjunctivae are normal.  Is legally blind   Neck: Neck supple. No JVD present. No thyromegaly present.  Cardiovascular: Normal rate, regular rhythm and intact distal pulses.   Respiratory: Effort normal and breath sounds normal. No respiratory distress. She has no wheezes.  GI: Soft. Bowel sounds are normal. She exhibits no distension. There is no tenderness.  Musculoskeletal: She exhibits no edema.  Has right hemiparesis Is able to move left extremities  Lymphadenopathy:    She has no cervical adenopathy.  Neurological: She is alert.  Skin: Skin is warm and dry. She is not diaphoretic.  Psychiatric: She has a normal mood and affect.     ASSESSMENT/ PLAN:  1. Gout no recent flares will continue allopurinol 100 mg daily   2. Hypertension: will continue   lopressor 50 mg twice daily  Will increase vasotec to 5 mg daily   3. Afib: heart rate regular will continue  Asa 81 mg daily with digoxin 125 mcg daily dig level is 0.68; lopressor 50 mg twice daily for rate control.   4. Dementia: is without change will continue aricept 10 mg daily; will monitor her weight is 109 pounds will need to continue to monitor   5. Diabetes:  hgb a1c is 5.6   She is off lantus and will stop metformin   6. Chronic systolic heart failure:  Ef is 35-40% (02-03-13)  is stable will not make changes will monitor   7. Dyslipidemia: will continue lipitor 10 mg  daily ldl is 41; trig 131   8. Seizures: no reports  of recent activity present; will continue keppra 250 mg twice daily  9. UI: will continue ditropan 5 mg twice daily   10. Depression: will continue zoloft 50 mg daily   11. Pulmonary nodule: will continue ct scans every 18  months and will monitor her status.   Her ct scan was done Dec 2016   Will due again in June 2018    12. CVA: is neurologically stable; has right hemiparesis; will continue asa 81 mg daily   13. Weight loss: her current weight is 109 pounds; will continue remeron 7.5 mg nightly  will continue supplements per facility protocol and will monitor  14. Hypokalemia:  Is off supplement will monitor   15. Stage IV chronic kidney disease: creat clear 34.25; bun 29.2; creat 1.14   Her health maintenance is up to date.    Time spent with patient  45  minutes >50% time spent counseling; reviewing medical record; tests; labs; and developing future plan of care   Ok Edwards NP [facility]  Contact [phone] Monday through Friday 8am- 5pm  After hours call [phone]

## 2016-04-17 ENCOUNTER — Non-Acute Institutional Stay (SKILLED_NURSING_FACILITY): Payer: Medicare Other | Admitting: Adult Health

## 2016-04-17 ENCOUNTER — Encounter: Payer: Self-pay | Admitting: Adult Health

## 2016-04-17 DIAGNOSIS — I110 Hypertensive heart disease with heart failure: Secondary | ICD-10-CM

## 2016-04-17 DIAGNOSIS — I5022 Chronic systolic (congestive) heart failure: Secondary | ICD-10-CM | POA: Diagnosis not present

## 2016-04-17 DIAGNOSIS — E1169 Type 2 diabetes mellitus with other specified complication: Secondary | ICD-10-CM | POA: Diagnosis not present

## 2016-04-17 DIAGNOSIS — E785 Hyperlipidemia, unspecified: Secondary | ICD-10-CM | POA: Diagnosis not present

## 2016-04-17 DIAGNOSIS — M109 Gout, unspecified: Secondary | ICD-10-CM

## 2016-04-17 DIAGNOSIS — N183 Chronic kidney disease, stage 3 (moderate): Secondary | ICD-10-CM

## 2016-04-17 DIAGNOSIS — R569 Unspecified convulsions: Secondary | ICD-10-CM

## 2016-04-17 DIAGNOSIS — H548 Legal blindness, as defined in USA: Secondary | ICD-10-CM

## 2016-04-17 DIAGNOSIS — I70213 Atherosclerosis of native arteries of extremities with intermittent claudication, bilateral legs: Secondary | ICD-10-CM

## 2016-04-17 DIAGNOSIS — E1122 Type 2 diabetes mellitus with diabetic chronic kidney disease: Secondary | ICD-10-CM

## 2016-04-17 DIAGNOSIS — E1140 Type 2 diabetes mellitus with diabetic neuropathy, unspecified: Secondary | ICD-10-CM

## 2016-04-17 NOTE — Progress Notes (Signed)
Location:   [facility] Room Number: 3149 A Place of Service:  SNF (31)   CODE STATUS: Full Code  Allergies  Allergen Reactions  . Penicillins Other (See Comments)    Patient states that she was told previously by a doctor to not take this medication but is unsure why.     Chief Complaint  Patient presents with  . Medical Management of Chronic Issues    1 month follow up    HPI:  She is a long term resident of this facility being seen for the management of his chronic illnesses. Overall her status is without change. She is unable to fully participate in the hpi or ros; but did tell me that she feels good. There are no nursing concerns at this time.    Past Medical History:  Diagnosis Date  . Abdominal or pelvic swelling, mass, or lump, left upper quadrant   . CHF (congestive heart failure) ([place])   . Chronic atrial fibrillation ([place])   . Chronic kidney disease   . Congestive heart failure, unspecified   . Coronary atherosclerosis of unspecified type of vessel, native or graft   . Diabetes mellitus   . Edema   . Electrolyte and fluid disorders not elsewhere classified   . GERD (gastroesophageal reflux disease)   . Gout, unspecified   . Gout, unspecified   . Hypercholesterolemia   . Hypertension   . Hypopotassemia   . Hypopotassemia   . 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 Long term (current) use of anticoagulants   . Long term (current) use of anticoagulants   . 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  . Peripheral vascular disease, unspecified   . Peripheral vascular disease, unspecified   . Seizures ([place])   . Sinus of Valsalva aneurysm    a. By 2D echo 05/2011.  . Stroke ([place])   . Unspecified disorder of kidney and ureter   . Unspecified hereditary and idiopathic peripheral neuropathy   . Unspecified hereditary and idiopathic peripheral  neuropathy   . Unspecified late effects of cerebrovascular disease   . Unspecified late effects of cerebrovascular disease   . Unspecified urinary incontinence   . Urinary frequency   . Vascular dementia, uncomplicated     Past Surgical History:  Procedure Laterality Date  . AORTOGRAM  09/01/2008   Abd w/ bilateral lower extremity runoff arteriography  . FALSE ANEURYSM REPAIR  12/06/2011   Procedure: REPAIR FALSE ANEURYSM;  Surgeon: Angelia Mould, MD;  Location: [facility] OR;  Service: Vascular;  Laterality: Left;  Repair of left femoral Artery pseudoaneurysm  . FEMORAL ARTERY EXPLORATION  12/06/2011   Procedure: FEMORAL ARTERY EXPLORATION;  Surgeon: Angelia Mould, MD;  Location: [facility] OR;  Service: Vascular;  Laterality: N/A;  Exploration of large pseudoaneurysm left side of fem-fem bypass graft   . femoral to femoral bypass graft     . FEMORAL-FEMORAL BYPASS GRAFT  12/06/2011   Procedure: BYPASS GRAFT FEMORAL-FEMORAL ARTERY;  Surgeon: Angelia Mould, MD;  Location: [facility] OR;  Service: Vascular;  Laterality: Bilateral;  Revision of left to right Femoral-Femoral bypass graft  . FEMORAL-POPLITEAL BYPASS GRAFT Left 05/2002   lower extremity femoral to below knee w/ non-versed greater saphenous vein  . FEMORAL-POPLITEAL BYPASS GRAFT Left 11/2002  . TUBAL LIGATION Bilateral     Social History   Social History  . Marital status: Widowed  Spouse name: N/A  . Number of children: N/A  . Years of education: N/A   Occupational History  . Not on file.   Social History Main Topics  . Smoking status: Former Smoker    Types: Cigarettes    Quit date: 01/29/2001  . Smokeless tobacco: Never Used  . Alcohol use No  . Drug use: No  . Sexual activity: No   Other Topics Concern  . Not on file   Social History Narrative  . No narrative on file   Family History  Problem Relation Age of Onset  . Cancer Mother     Cervical  . Stroke Mother   . Diabetes Mother   . Stroke Father     . Heart disease Father       VITAL SIGNS BP (!) 180/82   Pulse 63   Temp 98 F (36.7 C)   Resp 18   Ht 5' (1.524 m)   Wt 114 lb 11.2 oz (52 kg)   SpO2 96%   BMI 22.40 kg/m   Patient's Medications  New Prescriptions   No medications on file  Previous Medications   ALLOPURINOL (ZYLOPRIM) 100 MG TABLET    Take 100 mg by mouth daily.   AMINO ACIDS-PROTEIN HYDROLYS (FEEDING SUPPLEMENT, PRO-STAT SUGAR FREE 64,) LIQD    Take 30 mLs by mouth 2 (two) times daily.   ASPIRIN 81 MG TABLET    Take 81 mg by mouth daily.   ATORVASTATIN (LIPITOR) 10 MG TABLET    Take 10 mg by mouth daily at 6 PM.   DIGOXIN (LANOXIN) 0.125 MG TABLET    Take 0.125 mg by mouth daily. Check apical heart rate prior to each dose. Hold dose if HR is less than 60 and notify MD   DONEPEZIL (ARICEPT) 10 MG TABLET    Take 1 tablet (10 mg total) by mouth at bedtime.   ENALAPRIL (VASOTEC) 5 MG TABLET    Take 5 mg by mouth daily.   LEVETIRACETAM (KEPPRA) 250 MG TABLET    Take 250 mg by mouth 2 (two) times daily.   METOPROLOL (LOPRESSOR) 50 MG TABLET    Take 50 mg by mouth 2 (two) times daily. Hold if pulse  < 60   MIRTAZAPINE (REMERON) 7.5 MG TABLET    Take 7.5 mg by mouth at bedtime.   OXYBUTYNIN (DITROPAN) 5 MG TABLET    Take 1 tablet (5 mg total) by mouth 2 (two) times daily.   SERTRALINE (ZOLOFT) 50 MG TABLET    Take 50 mg by mouth daily.   UNABLE TO FIND    Med Name: Med Pass 2 give 120 ml three times daily by mouth. HSG Mech Soft texture  Modified Medications   No medications on file  Discontinued Medications   ENALAPRIL (VASOTEC) 2.5 MG TABLET    Take 1 tablet (2.5 mg total) by mouth daily.   METFORMIN (GLUCOPHAGE) 500 MG TABLET    Take 500 mg by mouth 2 (two) times daily with a meal.     SIGNIFICANT DIAGNOSTIC EXAMS  05-21-14: ct of chest: 1. Right upper lobe 5 mm nodule unchanged. 2. Additional nodules identified, some of which are noncalcified andvalso warrants follow-up. 3. Persistent mediastinal and  hilar adenopathy. This may bevlong-standing given the previous reports of adenopathy. At the [facility] next exam, consider contrast-enhanced exam for further evaluation of the lymph nodes. 4. Given patient's history of smoking, follow-up chest CT at 6-12vmonths is recommended. 5. Coronary artery disease. . 01-10-15:  ct of chest; 1. No acute findings. 2. Stable lung nodules as detailed above, the largest in the right upper lobe measuring 5 mm. Recommend additional unenhanced chest CT follow-up and 18 months per Fleischner criteria for following pulmonary nodules.      LABS REVIEWED:    05-05-15: wbc 7.2; hgb 12.2; hct 37.2; mcv 88.5; plt 202; glucose 74; bun 59.2; creat 1.60; k+ 4.5; na++ 142; chol 126; ldl 46; trig 133; hdl 54; dig 1.49; tsh 0.86; hgb a1c 5.6  05-10-15: glucose 69; bun 37.8; creat 1.24; k+ 4.5; na++ 145  09-14-15: hgb a1c 5.6 09-20-15: glucose 85; bun 26.8; creat 1.04; k+ 4.5; na++144; mag 1.7; dig 0.68  11-10-15; wbc 6.7 hgb 12.2; hct 37.3; mcv 86.0; plt 209; gucose 83; bun 24.1; creat 1.24; k+ 5.5; na++ 144; (creat clear 31.49); liver normal albumin 3.8; chol 117; ldl 41; trig 131; hdl 50   Urine micro-albumin <1.2  02-03-16: wbc 6.5; hgb 11.8; hct 38.9;mcv 92.7; plt 171; glucose 101; bun 29.2; creat 1.14; k+ 4.5; na++ 147; ca 10.4; (creat clear 34.25); liver normal albumin 3.8      Review of Systems Unable to perform ROS: Dementia     Physical Exam  Constitutional: No distress.  Frail   Eyes: Conjunctivae are normal.  Is legally blind   Neck: Neck supple. No JVD present. No thyromegaly present.  Cardiovascular: Normal rate, regular rhythm and intact distal pulses.   Respiratory: Effort normal and breath sounds normal. No respiratory distress. She has no wheezes.  GI: Soft. Bowel sounds are normal. She exhibits no distension. There is no tenderness.  Musculoskeletal: She exhibits no edema.  Has right hemiparesis Is able to move left extremities  Lymphadenopathy:     She has no cervical adenopathy.  Neurological: She is alert.  Skin: Skin is warm and dry. She is not diaphoretic.  Psychiatric: She has a normal mood and affect.     ASSESSMENT/ PLAN:  1. Gout no recent flares will continue allopurinol 100 mg daily   2. Hypertension: will continue   lopressor 50 mg twice daily vasotec  5 mg daily  Will begin norvasc 2.5 mg daily and will monitor   3. Afib: heart rate regular will continue  Asa 81 mg daily with digoxin 125 mcg daily dig level is 0.68; lopressor 50 mg twice daily for rate control.   4. Dementia: is without change will continue aricept 10 mg daily; will monitor her weight is 114 pounds will need to continue to monitor   5. Diabetes:  hgb a1c is 5.6   She is off lantus and will stop metformin   6. Chronic systolic heart failure:  Ef is 35-40% (02-03-13)  is stable will not make changes will monitor   7. Dyslipidemia: will continue lipitor 10 mg daily ldl is 41; trig 131   8. Seizures: no reports of recent activity present; will continue keppra 250 mg twice daily  9. UI: will continue ditropan 5 mg twice daily   10. Depression: will continue zoloft 50 mg daily   11. Pulmonary nodule: will continue ct scans every 18  months and will monitor her status.   Her ct scan was done Dec 2016   Will due again in June 2018    12. CVA: is neurologically stable; has right hemiparesis; will continue asa 81 mg daily   13. Weight loss: her current weight is 114 pounds; will continue remeron 7.5 mg nightly  will continue supplements per facility protocol and  will monitor  14. Hypokalemia:  Is off supplement will monitor   15. Stage III chronic kidney disease: creat clear 34.25; bun 29.2; creat 1.14    Will check uric acid level   Ok Edwards NP [facility]  Contact [phone] Monday through Friday 8am- 5pm  After hours call [phone]

## 2016-04-24 DIAGNOSIS — R634 Abnormal weight loss: Secondary | ICD-10-CM | POA: Insufficient documentation

## 2016-04-24 DIAGNOSIS — F32A Depression, unspecified: Secondary | ICD-10-CM | POA: Insufficient documentation

## 2016-04-24 DIAGNOSIS — F329 Major depressive disorder, single episode, unspecified: Secondary | ICD-10-CM | POA: Insufficient documentation

## 2016-05-04 ENCOUNTER — Encounter: Payer: Self-pay | Admitting: Adult Health

## 2016-05-04 NOTE — Progress Notes (Signed)
Opened in error; Disregard.

## 2016-05-15 ENCOUNTER — Encounter: Payer: Self-pay | Admitting: Internal Medicine

## 2016-05-15 ENCOUNTER — Non-Acute Institutional Stay (SKILLED_NURSING_FACILITY): Payer: Medicare Other | Admitting: Internal Medicine

## 2016-05-15 DIAGNOSIS — R569 Unspecified convulsions: Secondary | ICD-10-CM | POA: Diagnosis not present

## 2016-05-15 DIAGNOSIS — F0150 Vascular dementia without behavioral disturbance: Secondary | ICD-10-CM

## 2016-05-15 DIAGNOSIS — N183 Chronic kidney disease, stage 3 (moderate): Secondary | ICD-10-CM

## 2016-05-15 DIAGNOSIS — E1122 Type 2 diabetes mellitus with diabetic chronic kidney disease: Secondary | ICD-10-CM | POA: Diagnosis not present

## 2016-05-15 DIAGNOSIS — I480 Paroxysmal atrial fibrillation: Secondary | ICD-10-CM | POA: Diagnosis not present

## 2016-05-15 DIAGNOSIS — I6990 Unspecified sequelae of unspecified cerebrovascular disease: Secondary | ICD-10-CM

## 2016-05-15 DIAGNOSIS — E1140 Type 2 diabetes mellitus with diabetic neuropathy, unspecified: Secondary | ICD-10-CM | POA: Diagnosis not present

## 2016-05-15 DIAGNOSIS — I5022 Chronic systolic (congestive) heart failure: Secondary | ICD-10-CM | POA: Diagnosis not present

## 2016-05-15 NOTE — Progress Notes (Signed)
Location:   [facility] Room Number: 106/A Place of Service:  SNF [phone]) Provider: Gabriel Rainwater, Brayton Layman, DO  Patient Care Team: Gildardo Cranker, DO as PCP - General (Internal Medicine) Gerlene Fee, NP as Nurse Practitioner ([place]) [facility] ([place])  Extended Emergency Contact Information Primary Emergency Contact: Judd Lien of [place] Phone: [phone] Relation: Daughter Secondary Emergency Contact: Alfonzo Beers, [place] of [place] Phone: [phone] Mobile Phone: [phone] Relation: Niece  Code Status:  MOST Goals of care: Advanced Directive information Advanced Directives 05/15/2016  Does Patient Have a Medical Advance Directive? Yes  Type of Advance Directive Out of facility DNR (pink MOST or yellow form)  Does patient want to make changes to medical advance directive? No - Patient declined  Copy of [place] in Chart? -  Would patient like information on creating a medical advance directive? -  Pre-existing out of facility DNR order (yellow form or pink MOST form) -     Chief Complaint  Patient presents with  . Medical Management of Chronic Issues    Routine Visit  Medical management of chronic medical conditions including hypertension atrial fibrillation dementia gout diabetes diet-controlled-chronic systolic CHF-seizure disorder depression history CVA and weight loss as well as stage III chronic kidney disease  HPI:  Pt is a 76 y.o. female seen today for medical management of chronic diseases..  She does have significant dementia but appears to be doing well with supportive care her weight has been stable nursing does not report any acute issues.  She is a type II diabetic currently off all medication blood sugar appears to be more the mid low 100s most recent hemoglobin A1c was 5.6.  She does  have a history of weight loss which appears to have stabilized at around 114 pounds she is on Remeron for appetite stimulation.  Currently she is resting in bed comfortably is a poor historian secondary to dementia but when asked if she's having any pain or shortness of breath she says no she is pleasant smiling and does follow simple verbal commands     Past Medical History:  Diagnosis Date  . Abdominal or pelvic swelling, mass, or lump, left upper quadrant   . CHF (congestive heart failure) ([place])   . Chronic atrial fibrillation ([place])   . Chronic kidney disease   . Congestive heart failure, unspecified   . Coronary atherosclerosis of unspecified type of vessel, native or graft   . Diabetes mellitus   . Edema   . Electrolyte and fluid disorders not elsewhere classified   . GERD (gastroesophageal reflux disease)   . Gout, unspecified   . Gout, unspecified   . Hypercholesterolemia   . Hypertension   . Hypopotassemia   . Hypopotassemia   . 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 Long term (current) use of anticoagulants   . Long term (current) use of anticoagulants   . 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  . Peripheral vascular disease, unspecified ([place])   . Peripheral vascular disease, unspecified ([place])   . Seizures ([place])   . Sinus of Valsalva aneurysm    a. By 2D echo 05/2011.  . Stroke ([place])   . Unspecified disorder of kidney and ureter   . Unspecified hereditary and idiopathic peripheral neuropathy   .  Unspecified hereditary and idiopathic peripheral neuropathy   . Unspecified late effects of cerebrovascular disease   . Unspecified late effects of cerebrovascular disease   . Unspecified urinary incontinence   . Urinary frequency   . Vascular dementia, uncomplicated    Past Surgical History:  Procedure Laterality Date  . AORTOGRAM  09/01/2008   Abd w/ bilateral  lower extremity runoff arteriography  . FALSE ANEURYSM REPAIR  12/06/2011   Procedure: REPAIR FALSE ANEURYSM;  Surgeon: Angelia Mould, MD;  Location: [facility] OR;  Service: Vascular;  Laterality: Left;  Repair of left femoral Artery pseudoaneurysm  . FEMORAL ARTERY EXPLORATION  12/06/2011   Procedure: FEMORAL ARTERY EXPLORATION;  Surgeon: Angelia Mould, MD;  Location: [facility] OR;  Service: Vascular;  Laterality: N/A;  Exploration of large pseudoaneurysm left side of fem-fem bypass graft   . femoral to femoral bypass graft     . FEMORAL-FEMORAL BYPASS GRAFT  12/06/2011   Procedure: BYPASS GRAFT FEMORAL-FEMORAL ARTERY;  Surgeon: Angelia Mould, MD;  Location: [facility] OR;  Service: Vascular;  Laterality: Bilateral;  Revision of left to right Femoral-Femoral bypass graft  . FEMORAL-POPLITEAL BYPASS GRAFT Left 05/2002   lower extremity femoral to below knee w/ non-versed greater saphenous vein  . FEMORAL-POPLITEAL BYPASS GRAFT Left 11/2002  . TUBAL LIGATION Bilateral     Allergies  Allergen Reactions  . Penicillins Other (See Comments)    Patient states that she was told previously by a doctor to not take this medication but is unsure why.     Allergies as of 05/15/2016      Reactions   Penicillins Other (See Comments)   Patient states that she was told previously by a doctor to not take this medication but is unsure why.       Medication List       Accurate as of 05/15/16  3:11 PM. Always use your most recent med list.          allopurinol 100 MG tablet Commonly known as:  ZYLOPRIM Take 100 mg by mouth daily.   amLODipine 2.5 MG tablet Commonly known as:  NORVASC Take 2.5 mg by mouth daily.   aspirin 81 MG tablet Take 81 mg by mouth daily.   atorvastatin 10 MG tablet Commonly known as:  LIPITOR Take 10 mg by mouth at bedtime.   digoxin 0.125 MG tablet Commonly known as:  LANOXIN Take 0.125 mg by mouth daily. Check apical heart rate prior to each dose. Hold dose if HR  is less than 60 and notify MD   donepezil 10 MG tablet Commonly known as:  ARICEPT Take 1 tablet (10 mg total) by mouth at bedtime.   enalapril 5 MG tablet Commonly known as:  VASOTEC Take 5 mg by mouth daily.   feeding supplement (PRO-STAT SUGAR FREE 64) Liqd Take 30 mLs by mouth 2 (two) times daily.   levETIRAcetam 250 MG tablet Commonly known as:  KEPPRA Take 250 mg by mouth 2 (two) times daily.   metoprolol 50 MG tablet Commonly known as:  LOPRESSOR Take 50 mg by mouth 2 (two) times daily. Hold if pulse  < 60   mirtazapine 7.5 MG tablet Commonly known as:  REMERON Take 7.5 mg by mouth at bedtime.   NUTRITIONAL SUPPLEMENT PO Give by mouth House Shakes 4 oz 3 times daily   oxybutynin 5 MG tablet Commonly known as:  DITROPAN Take 1 tablet (5 mg total) by mouth 2 (two) times daily.   sertraline 50 MG  tablet Commonly known as:  ZOLOFT Take 50 mg by mouth daily.   UNABLE TO FIND Med Name: Med Pass 2 give 120 ml three times daily by mouth. HSG Mech Soft texture       Review of Systems   This is very limited secondary to dementia please see history of present illness  Immunization History  Administered Date(s) Administered  . Influenza Split 12/09/2011  . Influenza-Unspecified 11/27/2013, 03/08/2015, 11/03/2015  . PPD Test 10/14/2015  . Pneumococcal Polysaccharide-23 02/05/2013   Pertinent  Health Maintenance Due  Topic Date Due  . HEMOGLOBIN A1C  04/17/2017 (Originally 03/16/2016)  . FOOT EXAM  05/29/2016  . INFLUENZA VACCINE  08/29/2016  . OPHTHALMOLOGY EXAM  01/08/2017  . DEXA SCAN  Completed   Fall Risk  08/25/2012 08/04/2012 06/09/2012 05/06/2012  Falls in the past year? No No No No   Functional Status Survey:    Vitals:   05/15/16 1409  BP: 138/70  Pulse: 78  Resp: 18  Temp: (!) 96.6 F (35.9 C)  TempSrc: Oral  SpO2: 98%  Weight is 114 pounds this appears to be stable  Physical Exam Constitutional: No distress.  Frail   Eyes: Conjunctivae  are normal.  Is legally blind   Neck: Neck supple. No JVD present. No thyromegaly present.  Cardiovascular: Normal rate, regular rhythm and intact distal pulses.   at times she is slightly bradycardic initially pulses in the 50s but on recheck was 60 Respiratory: Effort normal and breath sounds normal. No respiratory distress. She has no wheezes.  GI: Soft. Bowel sounds are normal. She exhibits no distension. There is no tenderness.  Musculoskeletal: She exhibits no edema.  Has right hemiparesis Is able to move left extremities  and has some movement of her right lower extremity  Lymphadenopathy:    She has no cervical adenopathy.  Neurological: She is alert. With right-sided deficits as noted above Skin: Skin is warm and dry. She is not diaphoretic.  Psychiatric: She has a normal mood and affect.   Labs reviewed:  02/03/2016.  WBC 6.5 hemoglobin 11.8 platelets 171.  Creatinine 1.14-BUN 29-potassium 4.5-sodium 147-calcium 10.4-liver function tests within normal limits albumin was 3.8  Recent Labs  09/20/15 11/10/15  NA 144 144  K 4.5 5.5*  BUN 27* 24*  CREATININE 1.0 1.2*  MG 1.7  --     Recent Labs  11/10/15  AST 26  ALT 24  ALKPHOS 86    Recent Labs  11/10/15  WBC 6.7  HGB 12.2  HCT 37  PLT 209   Lab Results  Component Value Date   TSH 0.86 05/05/2015   Lab Results  Component Value Date   HGBA1C 5.6 09/14/2015   Lab Results  Component Value Date   CHOL 117 11/10/2015   HDL 50 11/10/2015   LDLCALC 41 11/10/2015   TRIG 131 11/10/2015   CHOLHDL 2.7 02/03/2013    Significant Diagnostic Results in last 30 days:  No results found.  Assessment/Plan  1-. Gout no recent flares will continue allopurinol 100 mg daily   2. Hypertension: will continue   lopressor 50 mg twice daily vasotec  5 mg daily  also norvasc 2.5 mg a day   3. Afib: heart rhythm regular has occasional mild bradycardia will continue  Asa 81 mg daily with digoxin 125 mcg daily dig  level was 0.68 will update this--; lopressor 50 mg twice daily for rate control.   4. Dementia: is without change will continue aricept 10 mg  daily; will monitor her weight is 114 pounds will need to continue to monitor   5. Diabetes:  hgb a1c is 5.6   She is off lantus and metformin will update hemoglobin A1c-appears blood sugars are more 100s   6. Chronic systolic heart failure:  Ef is 35-40% (02-03-13)  is stable will not make changes will monitor weight is stable I do not really note any significant edema  7. Dyslipidemia: will continue lipitor 10 mg daily ldl is 41; trig 131   8. Seizures: no reports of recent activity present; will continue keppra 250 mg twice daily  9. UI: will continue ditropan 5 mg twice daily   10. Depression: will continue zoloft 50 mg daily   11. Pulmonary nodule: will continue ct scans every 18  months and will monitor her status.   Her ct scan was done Dec 2016   Will due again in June 2018    12. CVA: is neurologically stable; has right hemiparesis; will continue asa 81 mg daily   13. Weight loss: her current weight is 114 pounds; will continue remeron 7.5 mg nightly  will continue supplements per facility protocol and will monitor  14. Hypokalemia:  Is off supplement will monitor with an updated metabolic panel--potassium was 4.5 on lab done in January  15. Stage III chronic kidney disease: creat clear 34.25; bun 29.2; creat 1.14 we'll update this as well.  #16 mild hypernatremia last sodium was 147 January we will update this  [phone]

## 2016-05-16 LAB — BASIC METABOLIC PANEL
BUN: 32 mg/dL — AB (ref 4–21)
Creatinine: 1.1 mg/dL (ref 0.5–1.1)
GLUCOSE: 107 mg/dL
Potassium: 4.4 mmol/L (ref 3.4–5.3)
Sodium: 142 mmol/L (ref 137–147)

## 2016-05-16 LAB — CBC AND DIFFERENTIAL
HEMATOCRIT: 38 % (ref 36–46)
Hemoglobin: 11.7 g/dL — AB (ref 12.0–16.0)
NEUTROS ABS: 4 /uL
Platelets: 186 10*3/uL (ref 150–399)
WBC: 6.9 10*3/mL

## 2016-05-16 LAB — HEMOGLOBIN A1C: Hemoglobin A1C: 6.9

## 2016-06-15 ENCOUNTER — Encounter: Payer: Self-pay | Admitting: Adult Health

## 2016-06-15 ENCOUNTER — Non-Acute Institutional Stay (SKILLED_NURSING_FACILITY): Payer: Medicare Other | Admitting: Adult Health

## 2016-06-15 DIAGNOSIS — R569 Unspecified convulsions: Secondary | ICD-10-CM

## 2016-06-15 DIAGNOSIS — E1122 Type 2 diabetes mellitus with diabetic chronic kidney disease: Secondary | ICD-10-CM

## 2016-06-15 DIAGNOSIS — M109 Gout, unspecified: Secondary | ICD-10-CM | POA: Diagnosis not present

## 2016-06-15 DIAGNOSIS — F0150 Vascular dementia without behavioral disturbance: Secondary | ICD-10-CM | POA: Diagnosis not present

## 2016-06-15 DIAGNOSIS — E1169 Type 2 diabetes mellitus with other specified complication: Secondary | ICD-10-CM

## 2016-06-15 DIAGNOSIS — E1140 Type 2 diabetes mellitus with diabetic neuropathy, unspecified: Secondary | ICD-10-CM | POA: Diagnosis not present

## 2016-06-15 DIAGNOSIS — I480 Paroxysmal atrial fibrillation: Secondary | ICD-10-CM | POA: Diagnosis not present

## 2016-06-15 DIAGNOSIS — I5022 Chronic systolic (congestive) heart failure: Secondary | ICD-10-CM

## 2016-06-15 DIAGNOSIS — E785 Hyperlipidemia, unspecified: Secondary | ICD-10-CM

## 2016-06-15 DIAGNOSIS — N183 Chronic kidney disease, stage 3 (moderate): Secondary | ICD-10-CM

## 2016-06-15 DIAGNOSIS — I110 Hypertensive heart disease with heart failure: Secondary | ICD-10-CM | POA: Diagnosis not present

## 2016-06-15 NOTE — Progress Notes (Signed)
Location:   [facility] Room Number: 106 A Place of Service:  SNF (31)   CODE STATUS: Full Code  Allergies  Allergen Reactions  . Penicillins Other (See Comments)    Patient states that she was told previously by a doctor to not take this medication but is unsure why.     Chief Complaint  Patient presents with  . Medical Management of Chronic Issues    1 month follow up    HPI:  She is a long term resident of this facility is being seen for the management of her chronic illnesses. Overall there is little change in her status. She is unable to fully participate in the hpi or ros. There are no nursing concerns at this time. She does spend all of her time in bed per her choice.    Past Medical History:  Diagnosis Date  . Abdominal or pelvic swelling, mass, or lump, left upper quadrant   . CHF (congestive heart failure) ([place])   . Chronic atrial fibrillation ([place])   . Chronic kidney disease   . Congestive heart failure, unspecified   . Coronary atherosclerosis of unspecified type of vessel, native or graft   . Diabetes mellitus   . Edema   . Electrolyte and fluid disorders not elsewhere classified   . GERD (gastroesophageal reflux disease)   . Gout, unspecified   . Gout, unspecified   . Hypercholesterolemia   . Hypertension   . Hypopotassemia   . Hypopotassemia   . 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 Long term (current) use of anticoagulants   . Long term (current) use of anticoagulants   . 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  . Peripheral vascular disease, unspecified ([place])   . Peripheral vascular disease, unspecified ([place])   . Seizures ([place])   . Sinus of Valsalva aneurysm    a. By 2D echo 05/2011.  . Stroke ([place])   . Unspecified disorder of kidney and ureter   . Unspecified hereditary and idiopathic peripheral neuropathy   . Unspecified  hereditary and idiopathic peripheral neuropathy   . Unspecified late effects of cerebrovascular disease   . Unspecified late effects of cerebrovascular disease   . Unspecified urinary incontinence   . Urinary frequency   . Vascular dementia, uncomplicated     Past Surgical History:  Procedure Laterality Date  . AORTOGRAM  09/01/2008   Abd w/ bilateral lower extremity runoff arteriography  . FALSE ANEURYSM REPAIR  12/06/2011   Procedure: REPAIR FALSE ANEURYSM;  Surgeon: Angelia Mould, MD;  Location: [facility] OR;  Service: Vascular;  Laterality: Left;  Repair of left femoral Artery pseudoaneurysm  . FEMORAL ARTERY EXPLORATION  12/06/2011   Procedure: FEMORAL ARTERY EXPLORATION;  Surgeon: Angelia Mould, MD;  Location: [facility] OR;  Service: Vascular;  Laterality: N/A;  Exploration of large pseudoaneurysm left side of fem-fem bypass graft   . femoral to femoral bypass graft     . FEMORAL-FEMORAL BYPASS GRAFT  12/06/2011   Procedure: BYPASS GRAFT FEMORAL-FEMORAL ARTERY;  Surgeon: Angelia Mould, MD;  Location: [facility] OR;  Service: Vascular;  Laterality: Bilateral;  Revision of left to right Femoral-Femoral bypass graft  . FEMORAL-POPLITEAL BYPASS GRAFT Left 05/2002   lower extremity femoral to below knee w/ non-versed greater saphenous vein  . FEMORAL-POPLITEAL BYPASS GRAFT Left 11/2002  . TUBAL LIGATION Bilateral     Social History  Social History  . Marital status: Widowed    Spouse name: N/A  . Number of children: N/A  . Years of education: N/A   Occupational History  . Not on file.   Social History Main Topics  . Smoking status: Former Smoker    Types: Cigarettes    Quit date: 01/29/2001  . Smokeless tobacco: Never Used  . Alcohol use No  . Drug use: No  . Sexual activity: No   Other Topics Concern  . Not on file   Social History Narrative  . No narrative on file   Family History  Problem Relation Age of Onset  . Cancer Mother        Cervical  . Stroke Mother     . Diabetes Mother   . Stroke Father   . Heart disease Father       VITAL SIGNS BP (!) 111/97   Pulse (!) 101   Temp 97.6 F (36.4 C)   Ht 5' (1.524 m)   Wt 116 lb (52.6 kg)   SpO2 95%   BMI 22.65 kg/m   Patient's Medications  New Prescriptions   No medications on file  Previous Medications   ALLOPURINOL (ZYLOPRIM) 100 MG TABLET    Take 100 mg by mouth daily.   AMINO ACIDS-PROTEIN HYDROLYS (FEEDING SUPPLEMENT, PRO-STAT SUGAR FREE 64,) LIQD    Take 30 mLs by mouth 2 (two) times daily.    AMLODIPINE (NORVASC) 2.5 MG TABLET    Take 2.5 mg by mouth daily.   ASPIRIN 81 MG TABLET    Take 81 mg by mouth daily.   ATORVASTATIN (LIPITOR) 10 MG TABLET    Take 10 mg by mouth at bedtime.    DIGOXIN (LANOXIN) 0.125 MG TABLET    Take 0.125 mg by mouth daily. Check apical heart rate prior to each dose. Hold dose if HR is less than 60 and notify MD   DONEPEZIL (ARICEPT) 10 MG TABLET    Take 1 tablet (10 mg total) by mouth at bedtime.   ENALAPRIL (VASOTEC) 5 MG TABLET    Take 5 mg by mouth daily.    LEVETIRACETAM (KEPPRA) 250 MG TABLET    Take 250 mg by mouth 2 (two) times daily.   METOPROLOL (LOPRESSOR) 50 MG TABLET    Take 50 mg by mouth 2 (two) times daily. Hold if pulse  < 60   MIRTAZAPINE (REMERON) 7.5 MG TABLET    Take 7.5 mg by mouth at bedtime.   NUTRITIONAL SUPPLEMENTS (NUTRITIONAL SUPPLEMENT PO)    Give by mouth House Shakes 4 oz 3 times daily   OXYBUTYNIN (DITROPAN) 5 MG TABLET    Take 1 tablet (5 mg total) by mouth 2 (two) times daily.   SERTRALINE (ZOLOFT) 50 MG TABLET    Take 50 mg by mouth daily.    UNABLE TO FIND    Med Name: Med Pass 2 give 120 ml three times daily by mouth. HSG Mech Soft texture  Modified Medications   No medications on file  Discontinued Medications   No medications on file     SIGNIFICANT DIAGNOSTIC EXAMS  05-21-14: ct of chest: 1. Right upper lobe 5 mm nodule unchanged. 2. Additional nodules identified, some of which are noncalcified andvalso  warrants follow-up. 3. Persistent mediastinal and hilar adenopathy. This may bevlong-standing given the previous reports of adenopathy. At the [facility] next exam, consider contrast-enhanced exam for further evaluation of the lymph nodes. 4. Given patient's history of smoking, follow-up chest  CT at 6-12vmonths is recommended. 5. Coronary artery disease. . 01-10-15: ct of chest; 1. No acute findings. 2. Stable lung nodules as detailed above, the largest in the right upper lobe measuring 5 mm. Recommend additional unenhanced chest CT follow-up and 18 months per Fleischner criteria for following pulmonary nodules.      LABS REVIEWED:    09-14-15: hgb a1c 5.6 09-20-15: glucose 85; bun 26.8; creat 1.04; k+ 4.5; na++144; mag 1.7; dig 0.68  11-10-15; wbc 6.7 hgb 12.2; hct 37.3; mcv 86.0; plt 209; gucose 83; bun 24.1; creat 1.24; k+ 5.5; na++ 144; (creat clear 31.49); liver normal albumin 3.8; chol 117; ldl 41; trig 131; hdl 50   Urine micro-albumin <1.2  02-03-16: wbc 6.5; hgb 11.8; hct 38.9;mcv 92.7; plt 171; glucose 101; bun 29.2; creat 1.14; k+ 4.5; na++ 147; ca 10.4; (creat clear 34.25); liver normal albumin 3.8  05-16-16: wbc 6.9; hgb 11.7; ht 38.3; mcv 87.8; plt 186; glucose 107; bun 32.2; creat 1.08; k+ 4.4; na++ 142; ca 9.8; hgb a1c 6.9     Review of Systems Unable to perform ROS: Dementia     Physical Exam  Constitutional: No distress.  Frail   Eyes: Conjunctivae are normal.  Is legally blind   Neck: Neck supple. No JVD present. No thyromegaly present.  Cardiovascular: Normal rate, regular rhythm and intact distal pulses.   Respiratory: Effort normal and breath sounds normal. No respiratory distress. She has no wheezes.  GI: Soft. Bowel sounds are normal. She exhibits no distension. There is no tenderness.  Musculoskeletal: She exhibits no edema.  Has right hemiparesis Is able to move left extremities  Lymphadenopathy:    She has no cervical adenopathy.  Neurological: She is alert.    Skin: Skin is warm and dry. She is not diaphoretic.  Psychiatric: She has a normal mood and affect.     ASSESSMENT/ PLAN:  1. Gout no recent flares will continue allopurinol 100 mg daily   2. Hypertension: b/p 111/97 will continue   lopressor 50 mg twice daily vasotec  5 mg daily and norvasc 2.5 mg daily and will monitor   3. Afib: heart rate regular will continue  Asa 81 mg daily with digoxin 125 mcg daily dig level is 0.68; lopressor 50 mg twice daily for rate control.   4. Dementia: is without change will continue aricept 10 mg daily; will monitor her weight is 116 pounds will need to continue to monitor   5. Diabetes:  hgb a1c is 6.9   She is off lantus and  metformin   6. Chronic systolic heart failure:  Ef is 35-40% (02-03-13)  is stable will not make changes will monitor   7. Dyslipidemia: will continue lipitor 10 mg daily ldl is 41; trig 131   8. Seizures: no reports of recent activity present; will continue keppra 250 mg twice daily  9. UI: will continue ditropan 5 mg twice daily   10. Depression: will continue zoloft 50 mg daily   11. Pulmonary nodule: will continue ct scans every 18  months and will monitor her status.   Her ct scan was done Dec 2016   Will due again in June 2018    12. CVA: is neurologically stable; has right hemiparesis; will continue asa 81 mg daily   13. Weight loss: her current weight is 114 pounds; will continue remeron 7.5 mg nightly  will continue supplements per facility protocol and will monitor  14. Hypokalemia:  Is off supplement will monitor  15. Stage III chronic kidney disease: creat clear 34.25; bun 29.2; creat 1.14    Will check dig and tsh    MD is aware of resident's narcotic use and is in agreement with current plan of care. We will attempt to wean resident as apropriate    Ok Edwards NP [facility]  Contact [phone] Monday through Friday 8am- 5pm  After hours call [phone]

## 2016-06-26 LAB — TSH: TSH: 4.68 (ref <=5.90)

## 2016-07-09 ENCOUNTER — Encounter: Payer: Self-pay | Admitting: Internal Medicine

## 2016-07-09 ENCOUNTER — Non-Acute Institutional Stay (SKILLED_NURSING_FACILITY): Payer: Medicare Other | Admitting: Internal Medicine

## 2016-07-09 DIAGNOSIS — N183 Chronic kidney disease, stage 3 unspecified: Secondary | ICD-10-CM

## 2016-07-09 DIAGNOSIS — F0150 Vascular dementia without behavioral disturbance: Secondary | ICD-10-CM

## 2016-07-09 DIAGNOSIS — E1140 Type 2 diabetes mellitus with diabetic neuropathy, unspecified: Secondary | ICD-10-CM

## 2016-07-09 DIAGNOSIS — I110 Hypertensive heart disease with heart failure: Secondary | ICD-10-CM | POA: Diagnosis not present

## 2016-07-09 DIAGNOSIS — M109 Gout, unspecified: Secondary | ICD-10-CM

## 2016-07-09 DIAGNOSIS — R569 Unspecified convulsions: Secondary | ICD-10-CM

## 2016-07-09 DIAGNOSIS — I5022 Chronic systolic (congestive) heart failure: Secondary | ICD-10-CM

## 2016-07-09 DIAGNOSIS — E1122 Type 2 diabetes mellitus with diabetic chronic kidney disease: Secondary | ICD-10-CM | POA: Diagnosis not present

## 2016-07-09 DIAGNOSIS — I6930 Unspecified sequelae of cerebral infarction: Secondary | ICD-10-CM

## 2016-07-09 DIAGNOSIS — I480 Paroxysmal atrial fibrillation: Secondary | ICD-10-CM | POA: Diagnosis not present

## 2016-07-09 DIAGNOSIS — R001 Bradycardia, unspecified: Secondary | ICD-10-CM | POA: Diagnosis not present

## 2016-07-09 DIAGNOSIS — E1169 Type 2 diabetes mellitus with other specified complication: Secondary | ICD-10-CM

## 2016-07-09 DIAGNOSIS — E785 Hyperlipidemia, unspecified: Secondary | ICD-10-CM

## 2016-07-09 NOTE — Progress Notes (Signed)
Patient ID: Casey [name], female   DOB: 1940/06/13, 76 y.o.   MRN: [id number]    DATE:  07/09/2016  Location:    [facility] Room Number: 106 A Place of Service: SNF (31)   Extended Emergency Contact Information Primary Emergency Contact: Armstrong,Pat  [place] Phone: [phone] Relation: Daughter Secondary Emergency Contact: Alfonzo Beers, [place] Phone: [phone] Mobile Phone: [phone] Relation: Niece  Advanced Directive information Does Patient Have a Medical Advance Directive?: Yes, Type of Advance Directive: Out of facility DNR (pink MOST or yellow form), Pre-existing out of facility DNR order (yellow form or pink MOST form): Pink MOST form placed in chart (order not valid for inpatient use), Does patient want to make changes to medical advance directive?: No - Patient declined  Chief Complaint  Patient presents with  . Medical Management of Chronic Issues    Routine Visit    HPI:  76 yo female long term resident seen today for f/u. She has no concerns. Nursing c/a bradycardia. No recent fall. Appetite reduced. She spends most of her time in bed. Sleeps ok. She is a poor historian due to dementia. Hx obtained from chart.  Gout  - stable on allopurinol 100 mg daily. No recent flares  Hypertension - BP stable on lopressor 50 mg twice daily but HR dropping <60; vasotec  5 mg daily; norvasc 2.5 mg daily  PAF - rate controlled on digoxin 125 mcg daily; lopressor 50 mg twice daily. Takes ASA 81 mg daily for anticoagulation. Digoxin 0.68. HR dropping to <60  Dementia - vascular related. Takes aricept 10 mg daily. Weight 116 lbs and stable   DM - diet controlled. A1c 6.9%. off Lantus and metformin   Chronic systolic heart failure - reduced EF 35-40% (02-03-13). Stable. She takes lopressor, enalapril and digoxin. She has not req'd diuretic  Dyslipidemia - stable on lipitor 10 mg daily.  LDL 41; TG 131   Seizure d/o - stable on keppra 250 mg twice daily. Na 142  Urge incontinence - stable on ditropan 5 mg twice daily   Depression - mood stable on zoloft 50 mg daily   Pulmonary nodule - surveillance CT chest every 18  Months. Last CT done in Dec 2016. She will be due again this month.   Hx CVA - she has right hemiparesis. Stable. She gets ASA 81 mg daily   Weight loss - stable. Current wt 116 lbs. She takes remeron 7.5 mg nightly and gets nutritional supplements per facility protocol  hx Hypokalemia - stable off supplement   Chronic kidney disease - stage 3. Cr 1.08   Past Medical History:  Diagnosis Date  . Abdominal or pelvic swelling, mass, or lump, left upper quadrant   . CHF (congestive heart failure) ([place])   . Chronic atrial fibrillation ([place])   . Chronic kidney disease   . Congestive heart failure, unspecified   . Coronary atherosclerosis of unspecified type of vessel, native or graft   . Diabetes mellitus   . Edema   . Electrolyte and fluid disorders not elsewhere classified   . GERD (gastroesophageal reflux disease)   . Gout, unspecified   . Gout, unspecified   . Hypercholesterolemia   . Hypertension   . Hypopotassemia   . Hypopotassemia   . 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 Long term (current) use of anticoagulants   . Long term (current) use of anticoagulants   .  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  . Peripheral vascular disease, unspecified ([place])   . Peripheral vascular disease, unspecified ([place])   . Seizures ([place])   . Sinus of Valsalva aneurysm    a. By 2D echo 05/2011.  . Stroke ([place])   . Unspecified disorder of kidney and ureter   . Unspecified hereditary and idiopathic peripheral neuropathy   . Unspecified hereditary and idiopathic peripheral neuropathy   . Unspecified late effects of cerebrovascular disease   .  Unspecified late effects of cerebrovascular disease   . Unspecified urinary incontinence   . Urinary frequency   . Vascular dementia, uncomplicated     Past Surgical History:  Procedure Laterality Date  . AORTOGRAM  09/01/2008   Abd w/ bilateral lower extremity runoff arteriography  . FALSE ANEURYSM REPAIR  12/06/2011   Procedure: REPAIR FALSE ANEURYSM;  Surgeon: Angelia Mould, MD;  Location: [facility] OR;  Service: Vascular;  Laterality: Left;  Repair of left femoral Artery pseudoaneurysm  . FEMORAL ARTERY EXPLORATION  12/06/2011   Procedure: FEMORAL ARTERY EXPLORATION;  Surgeon: Angelia Mould, MD;  [facility];  Service: Vascular;  Laterality: N/A;  Exploration of large pseudoaneurysm left side of fem-fem bypass graft   . femoral to femoral bypass graft     . FEMORAL-FEMORAL BYPASS GRAFT  12/06/2011   Procedure: BYPASS GRAFT FEMORAL-FEMORAL ARTERY;  Surgeon: Angelia Mould, MD;  Location: [facility] OR;  Service: Vascular;  Laterality: Bilateral;  Revision of left to right Femoral-Femoral bypass graft  . FEMORAL-POPLITEAL BYPASS GRAFT Left 05/2002   lower extremity femoral to below knee w/ non-versed greater saphenous vein  . FEMORAL-POPLITEAL BYPASS GRAFT Left 11/2002  . TUBAL LIGATION Bilateral     Patient Care Team: Gildardo Cranker, DO as PCP - General (Internal Medicine) Nyoka Cowden Phylis Bougie, NP as Nurse Practitioner ([place]) [facility], [place] ([place])  Social History   Social History  . Marital status: Widowed    Spouse name: N/A  . Number of children: N/A  . Years of education: N/A   Occupational History  . Not on file.   Social History Main Topics  . Smoking status: Former Smoker    Types: Cigarettes    Quit date: 01/29/2001  . Smokeless tobacco: Never Used  . Alcohol use No  . Drug use: No  . Sexual activity: No   Other Topics Concern  . Not on file   Social History Narrative  . No narrative on file     reports that  she quit smoking about 15 years ago. Her smoking use included Cigarettes. She has never used smokeless tobacco. She reports that she does not drink alcohol or use drugs.  Family History  Problem Relation Age of Onset  . Cancer Mother        Cervical  . Stroke Mother   . Diabetes Mother   . Stroke Father   . Heart disease Father    Family Status  Relation Status  . Mother Deceased at age 51       Cause of Death: Complications of diabetes  . Father Deceased       Cause of Death: Heart disease  . Brother Alive  . Daughter Alive  . Son Alive  . Sister Deceased  . Brother Deceased  . Brother Deceased    Immunization History  Administered Date(s) Administered  .  Influenza Split 12/09/2011  . Influenza-Unspecified 11/27/2013, 03/08/2015, 11/03/2015  . PPD Test 01/26/2014, 10/14/2015  . Pneumococcal Polysaccharide-23 02/05/2013    Allergies  Allergen Reactions  . Penicillins Other (See Comments)    Patient states that she was told previously by a doctor to not take this medication but is unsure why.     Medications: Patient's Medications  New Prescriptions   No medications on file  Previous Medications   ALLOPURINOL (ZYLOPRIM) 100 MG TABLET    Take 100 mg by mouth daily.   AMINO ACIDS-PROTEIN HYDROLYS (FEEDING SUPPLEMENT, PRO-STAT SUGAR FREE 64,) LIQD    Take 30 mLs by mouth 2 (two) times daily.    AMLODIPINE (NORVASC) 2.5 MG TABLET    Take 2.5 mg by mouth daily.   ASPIRIN 81 MG TABLET    Take 81 mg by mouth daily.   ATORVASTATIN (LIPITOR) 10 MG TABLET    Take 10 mg by mouth at bedtime.    DONEPEZIL (ARICEPT) 10 MG TABLET    Take 1 tablet (10 mg total) by mouth at bedtime.   ENALAPRIL (VASOTEC) 5 MG TABLET    Take 5 mg by mouth daily.    LEVETIRACETAM (KEPPRA) 250 MG TABLET    Take 250 mg by mouth 2 (two) times daily.   METOPROLOL (LOPRESSOR) 50 MG TABLET    Take 50 mg by mouth 2 (two) times daily. Hold if pulse  < 60   MIRTAZAPINE (REMERON) 7.5 MG TABLET    Take 7.5 mg  by mouth at bedtime.   NUTRITIONAL SUPPLEMENTS (NUTRITIONAL SUPPLEMENT PO)    Give by mouth House Shakes 4 oz 3 times daily   OXYBUTYNIN (DITROPAN) 5 MG TABLET    Take 1 tablet (5 mg total) by mouth 2 (two) times daily.   SERTRALINE (ZOLOFT) 50 MG TABLET    Take 50 mg by mouth daily.    UNABLE TO FIND    Med Name: Med Pass 2 give 120 ml three times daily by mouth. HSG Mech Soft texture  Modified Medications   No medications on file  Discontinued Medications   DIGOXIN (LANOXIN) 0.125 MG TABLET    Take 0.125 mg by mouth daily. Check apical heart rate prior to each dose. Hold dose if HR is less than 60 and notify MD    Review of Systems  Unable to perform ROS: Dementia    Vitals:   07/09/16 1209  BP: 138/81  Pulse: 85  Resp: 18  Temp: 98 F (36.7 C)  TempSrc: Oral  SpO2: 96%  Weight: 116 lb (52.6 kg)   Body mass index is 22.65 kg/m.  Physical Exam  Constitutional: She appears well-developed.  Frail appearing, sitting up in bed in NAD, asleep but easily aroused  HENT:  Mouth/Throat: Oropharynx is clear and moist. No oropharyngeal exudate.  MMM; no oral thrush  Eyes: Pupils are equal, round, and reactive to light. No scleral icterus.  Blind in OU but sees shadows  Neck: Neck supple. Carotid bruit is not present. No tracheal deviation present. No thyromegaly present.  Cardiovascular: Normal rate and intact distal pulses.  An irregularly irregular rhythm present. Exam reveals no gallop and no friction rub.   Murmur heard.  Systolic murmur is present with a grade of 1/6  No LE edema b/l. no calf TTP.   Pulmonary/Chest: Effort normal and breath sounds normal. No stridor. No respiratory distress. She has no wheezes. She has no rales.  Abdominal: Soft. Normal appearance and bowel sounds are normal. She  exhibits no distension and no mass. There is no hepatomegaly. There is no tenderness. There is no rigidity, no rebound and no guarding. No hernia.  Musculoskeletal: She exhibits  edema.  Lymphadenopathy:    She has no cervical adenopathy.  Neurological: She is alert.  Skin: Skin is warm and dry. No rash noted.  Psychiatric: She has a normal mood and affect. Her behavior is normal.     Labs reviewed: Abstract on 05/16/2016  Component Date Value Ref Range Status  . Hemoglobin 05/16/2016 11.7* 12.0 - 16.0 g/dL Final  . HCT 05/16/2016 38  36 - 46 % Final  . Neutrophils Absolute 05/16/2016 4  /L Final  . Platelets 05/16/2016 186  150 - 399 K/L Final  . WBC 05/16/2016 6.9  10^3/mL Final  . Glucose 05/16/2016 107  mg/dL Final  . BUN 05/16/2016 32* 4 - 21 mg/dL Final  . Creatinine 05/16/2016 1.1  0.5 - 1.1 mg/dL Final  . Potassium 05/16/2016 4.4  3.4 - 5.3 mmol/L Final  . Sodium 05/16/2016 142  137 - 147 mmol/L Final  . Hemoglobin A1C 05/16/2016 6.9   Final  Nursing Home on 04/17/2016  Component Date Value Ref Range Status  . Microalb, Ur 11/10/2015 <1.2   Final    No results found.   Assessment/Plan   ICD-10-CM   1. Bradycardia probably med induced R00.1   2. Paroxysmal atrial fibrillation (HCC) I48.0   3. Hypertensive heart disease with chronic systolic congestive heart failure (HCC) I11.0    I50.22   4. Type 2 diabetes mellitus with diabetic neuropathy, without long-term current use of insulin (HCC) E11.40   5. CKD stage 3 due to type 2 diabetes mellitus (HCC) E11.22    N18.3   6. Seizures ([place]) R56.9   7. Gout of multiple sites, unspecified cause, unspecified chronicity M10.9   8. Vascular dementia without behavioral disturbance F01.50   9. History of CVA with residual deficit I69.30   10. Dyslipidemia associated with type 2 diabetes mellitus (HCC) E11.69    E78.5      Change metoprolol 50mg  in AM and 25mg  in PM, hold for HR<60  Check BP/HR daily and record  Cont other meds as ordered  PT/OT/ST as indicated  nutritional supplements as ordered  Will follow  Tyqwan Pink S. Perlie Gold  [facility] [address] [place], [place] [address] [phone] Cell (Monday-Friday 8 AM - 5 PM) [phone] After 5 PM and follow prompts

## 2016-07-23 ENCOUNTER — Non-Acute Institutional Stay (SKILLED_NURSING_FACILITY): Payer: Medicare Other

## 2016-07-23 DIAGNOSIS — Z0000 Encounter for general adult medical examination without abnormal findings: Secondary | ICD-10-CM | POA: Diagnosis not present

## 2016-07-23 NOTE — Patient Instructions (Signed)
Casey [name] , Thank you for taking time to come for your Medicare Wellness Visit. I appreciate your ongoing commitment to your health goals. Please review the following plan we discussed and let me know if I can assist you in the future.   Screening recommendations/referrals: Colonoscopy up to date, long term pt Mammogram up to date, long term pt Bone Density up to date Recommended yearly ophthalmology/optometry visit for glaucoma screening and checkup Recommended yearly dental visit for hygiene and checkup  Vaccinations: Influenza vaccine up to date. Due 10/5/02018 Pneumococcal vaccine 13 due Tdap vaccine up to date Shingles vaccine not in records  Advanced directives: Need a copy for chart  Conditions/risks identified: None  Next appointment: Dr. Eulas Post makes rounds.   Preventive Care 66 Years and Older, Female Preventive care refers to lifestyle choices and visits with your health care provider that can promote health and wellness. What does preventive care include?  A yearly physical exam. This is also called an annual well check.  Dental exams once or twice a year.  Routine eye exams. Ask your health care provider how often you should have your eyes checked.  Personal lifestyle choices, including:  Daily care of your teeth and gums.  Regular physical activity.  Eating a healthy diet.  Avoiding tobacco and drug use.  Limiting alcohol use.  Practicing safe sex.  Taking low-dose aspirin every day.  Taking vitamin and mineral supplements as recommended by your health care provider. What happens during an annual well check? The services and screenings done by your health care provider during your annual well check will depend on your age, overall health, lifestyle risk factors, and family history of disease. Counseling  Your health care provider may ask you questions about your:  Alcohol use.  Tobacco use.  Drug use.  Emotional well-being.  Home and  relationship well-being.  Sexual activity.  Eating habits.  History of falls.  Memory and ability to understand (cognition).  Work and work Statistician.  Reproductive health. Screening  You may have the following tests or measurements:  Height, weight, and BMI.  Blood pressure.  Lipid and cholesterol levels. These may be checked every 5 years, or more frequently if you are over 41 years old.  Skin check.  Lung cancer screening. You may have this screening every year starting at age 76 if you have a 30-pack-year history of smoking and currently smoke or have quit within the past 15 years.  Fecal occult blood test (FOBT) of the stool. You may have this test every year starting at age 77.  Flexible sigmoidoscopy or colonoscopy. You may have a sigmoidoscopy every 5 years or a colonoscopy every 10 years starting at age 60.  Hepatitis C blood test.  Hepatitis B blood test.  Sexually transmitted disease (STD) testing.  Diabetes screening. This is done by checking your blood sugar (glucose) after you have not eaten for a while (fasting). You may have this done every 1-3 years.  Bone density scan. This is done to screen for osteoporosis. You may have this done starting at age 49.  Mammogram. This may be done every 1-2 years. Talk to your health care provider about how often you should have regular mammograms. Talk with your health care provider about your test results, treatment options, and if necessary, the need for more tests. Vaccines  Your health care provider may recommend certain vaccines, such as:  Influenza vaccine. This is recommended every year.  Tetanus, diphtheria, and acellular pertussis (Tdap, Td) vaccine.  You may need a Td booster every 10 years.  Zoster vaccine. You may need this after age 24.  Pneumococcal 13-valent conjugate (PCV13) vaccine. One dose is recommended after age 42.  Pneumococcal polysaccharide (PPSV23) vaccine. One dose is recommended after  age 68. Talk to your health care provider about which screenings and vaccines you need and how often you need them. This information is not intended to replace advice given to you by your health care provider. Make sure you discuss any questions you have with your health care provider. Document Released: 02/11/2015 Document Revised: 10/05/2015 Document Reviewed: 11/16/2014 Elsevier Interactive Patient Education  2017 [place] Prevention in the Home Falls can cause injuries. They can happen to people of all ages. There are many things you can do to make your home safe and to help prevent falls. What can I do on the outside of my home?  Regularly fix the edges of walkways and driveways and fix any cracks.  Remove anything that might make you trip as you walk through a door, such as a raised step or threshold.  Trim any bushes or trees on the path to your home.  Use bright outdoor lighting.  Clear any walking paths of anything that might make someone trip, such as rocks or tools.  Regularly check to see if handrails are loose or broken. Make sure that both sides of any steps have handrails.  Any raised decks and porches should have guardrails on the edges.  Have any leaves, snow, or ice cleared regularly.  Use sand or salt on walking paths during winter.  Clean up any spills in your garage right away. This includes oil or grease spills. What can I do in the bathroom?  Use night lights.  Install grab bars by the toilet and in the tub and shower. Do not use towel bars as grab bars.  Use non-skid mats or decals in the tub or shower.  If you need to sit down in the shower, use a plastic, non-slip stool.  Keep the floor dry. Clean up any water that spills on the floor as soon as it happens.  Remove soap buildup in the tub or shower regularly.  Attach bath mats securely with double-sided non-slip rug tape.  Do not have throw rugs and other things on the floor that can  make you trip. What can I do in the bedroom?  Use night lights.  Make sure that you have a light by your bed that is easy to reach.  Do not use any sheets or blankets that are too big for your bed. They should not hang down onto the floor.  Have a firm chair that has side arms. You can use this for support while you get dressed.  Do not have throw rugs and other things on the floor that can make you trip. What can I do in the kitchen?  Clean up any spills right away.  Avoid walking on wet floors.  Keep items that you use a lot in easy-to-reach places.  If you need to reach something above you, use a strong step stool that has a grab bar.  Keep electrical cords out of the way.  Do not use floor polish or wax that makes floors slippery. If you must use wax, use non-skid floor wax.  Do not have throw rugs and other things on the floor that can make you trip. What can I do with my stairs?  Do not leave any  items on the stairs.  Make sure that there are handrails on both sides of the stairs and use them. Fix handrails that are broken or loose. Make sure that handrails are as long as the stairways.  Check any carpeting to make sure that it is firmly attached to the stairs. Fix any carpet that is loose or worn.  Avoid having throw rugs at the top or bottom of the stairs. If you do have throw rugs, attach them to the floor with carpet tape.  Make sure that you have a light switch at the top of the stairs and the bottom of the stairs. If you do not have them, ask someone to add them for you. What else can I do to help prevent falls?  Wear shoes that:  Do not have high heels.  Have rubber bottoms.  Are comfortable and fit you well.  Are closed at the toe. Do not wear sandals.  If you use a stepladder:  Make sure that it is fully opened. Do not climb a closed stepladder.  Make sure that both sides of the stepladder are locked into place.  Ask someone to hold it for you,  if possible.  Clearly mark and make sure that you can see:  Any grab bars or handrails.  First and last steps.  Where the edge of each step is.  Use tools that help you move around (mobility aids) if they are needed. These include:  Canes.  Walkers.  Scooters.  Crutches.  Turn on the lights when you go into a dark area. Replace any light bulbs as soon as they burn out.  Set up your furniture so you have a clear path. Avoid moving your furniture around.  If any of your floors are uneven, fix them.  If there are any pets around you, be aware of where they are.  Review your medicines with your doctor. Some medicines can make you feel dizzy. This can increase your chance of falling. Ask your doctor what other things that you can do to help prevent falls. This information is not intended to replace advice given to you by your health care provider. Make sure you discuss any questions you have with your health care provider. Document Released: 11/11/2008 Document Revised: 06/23/2015 Document Reviewed: 02/19/2014 Elsevier Interactive Patient Education  2017 [employer].

## 2016-07-23 NOTE — Progress Notes (Signed)
Subjective:   Casey [name] is a 76 y.o. female who presents for an Initial Medicare Annual Wellness Visit at [place]; incapacitated patient unable to answer questions appropriately.       Objective:    Today's Vitals   07/23/16 1031  BP: 122/70  Pulse: 70  Temp: 98.3 F (36.8 C)  TempSrc: Oral  SpO2: 96%  Weight: 116 lb (52.6 kg)  Height: 5' (1.524 m)   Body mass index is 22.65 kg/m.   Current Medications (verified) Outpatient Encounter Prescriptions as of 07/23/2016  Medication Sig  . allopurinol (ZYLOPRIM) 100 MG tablet Take 100 mg by mouth daily.  . Amino Acids-Protein Hydrolys (FEEDING SUPPLEMENT, PRO-STAT SUGAR FREE 64,) LIQD Take 30 mLs by mouth 2 (two) times daily.   Marland Kitchen amLODipine (NORVASC) 2.5 MG tablet Take 2.5 mg by mouth daily.  Marland Kitchen aspirin 81 MG tablet Take 81 mg by mouth daily.  Marland Kitchen atorvastatin (LIPITOR) 10 MG tablet Take 10 mg by mouth at bedtime.   . donepezil (ARICEPT) 10 MG tablet Take 1 tablet (10 mg total) by mouth at bedtime.  . enalapril (VASOTEC) 5 MG tablet Take 5 mg by mouth daily.   Marland Kitchen levETIRAcetam (KEPPRA) 250 MG tablet Take 250 mg by mouth 2 (two) times daily.  . metoprolol (LOPRESSOR) 50 MG tablet Take 50 mg by mouth 2 (two) times daily. Hold if pulse  < 60  . mirtazapine (REMERON) 7.5 MG tablet Take 7.5 mg by mouth at bedtime.  . Nutritional Supplements (NUTRITIONAL SUPPLEMENT PO) Give by mouth House Shakes 4 oz 3 times daily  . oxybutynin (DITROPAN) 5 MG tablet Take 1 tablet (5 mg total) by mouth 2 (two) times daily.  . sertraline (ZOLOFT) 50 MG tablet Take 50 mg by mouth daily.   Marland Kitchen UNABLE TO FIND Med Name: Med Pass 2 give 120 ml three times daily by mouth. HSG Mech Soft texture   No facility-administered encounter medications on file as of 07/23/2016.     Allergies (verified) Penicillins   History: Past Medical History:  Diagnosis Date  . Abdominal or pelvic swelling, mass, or lump, left upper quadrant   . CHF  (congestive heart failure) ([place])   . Chronic atrial fibrillation ([place])   . Chronic kidney disease   . Congestive heart failure, unspecified   . Coronary atherosclerosis of unspecified type of vessel, native or graft   . Diabetes mellitus   . Edema   . Electrolyte and fluid disorders not elsewhere classified   . GERD (gastroesophageal reflux disease)   . Gout, unspecified   . Gout, unspecified   . Hypercholesterolemia   . Hypertension   . Hypopotassemia   . Hypopotassemia   . 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 Long term (current) use of anticoagulants   . Long term (current) use of anticoagulants   . 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  . Peripheral vascular disease, unspecified ([place])   . Peripheral vascular disease, unspecified ([employer])   . Seizures ([place])   . Sinus of Valsalva aneurysm    a. By 2D echo 05/2011.  . Stroke ([place])   . Unspecified disorder of kidney and ureter   . Unspecified hereditary and idiopathic peripheral neuropathy   . Unspecified hereditary and idiopathic peripheral neuropathy   . Unspecified late effects of cerebrovascular disease   . Unspecified late effects of cerebrovascular disease   .  Unspecified urinary incontinence   . Urinary frequency   . Vascular dementia, uncomplicated    Past Surgical History:  Procedure Laterality Date  . AORTOGRAM  09/01/2008   Abd w/ bilateral lower extremity runoff arteriography  . FALSE ANEURYSM REPAIR  12/06/2011   Procedure: REPAIR FALSE ANEURYSM;  Surgeon: Angelia Mould, MD;  Location: [facility] OR;  Service: Vascular;  Laterality: Left;  Repair of left femoral Artery pseudoaneurysm  . FEMORAL ARTERY EXPLORATION  12/06/2011   Procedure: FEMORAL ARTERY EXPLORATION;  Surgeon: Angelia Mould, MD;  Location: [facility] OR;  Service: Vascular;  Laterality: N/A;  Exploration of large pseudoaneurysm left side of  fem-fem bypass graft   . femoral to femoral bypass graft     . FEMORAL-FEMORAL BYPASS GRAFT  12/06/2011   Procedure: BYPASS GRAFT FEMORAL-FEMORAL ARTERY;  Surgeon: Angelia Mould, MD;  Location: [facility] OR;  Service: Vascular;  Laterality: Bilateral;  Revision of left to right Femoral-Femoral bypass graft  . FEMORAL-POPLITEAL BYPASS GRAFT Left 05/2002   lower extremity femoral to below knee w/ non-versed greater saphenous vein  . FEMORAL-POPLITEAL BYPASS GRAFT Left 11/2002  . TUBAL LIGATION Bilateral    Family History  Problem Relation Age of Onset  . Cancer Mother        Cervical  . Stroke Mother   . Diabetes Mother   . Stroke Father   . Heart disease Father    Social History   Occupational History  . Not on file.   Social History Main Topics  . Smoking status: Former Smoker    Types: Cigarettes    Quit date: 01/29/2001  . Smokeless tobacco: Never Used  . Alcohol use No  . Drug use: No  . Sexual activity: No    Tobacco Counseling Counseling given: Not Answered   Activities of Daily Living In your present state of health, do you have any difficulty performing the following activities: 07/23/2016  Hearing? N  Vision? Y  Difficulty concentrating or making decisions? Y  Walking or climbing stairs? Y  Dressing or bathing? Y  Doing errands, shopping? Y  Preparing Food and eating ? Y  Using the Toilet? Y  In the past six months, have you accidently leaked urine? Y  Do you have problems with loss of bowel control? Y  Managing your Medications? Y  Managing your Finances? Y  Housekeeping or managing your Housekeeping? Y  Some recent data might be hidden    Immunizations and Health Maintenance Immunization History  Administered Date(s) Administered  . Influenza Split 12/09/2011  . Influenza-Unspecified 11/27/2013, 03/08/2015, 11/03/2015  . PPD Test 01/26/2014, 10/14/2015  . Pneumococcal Polysaccharide-23 02/05/2013   There are no preventive care reminders to display  for this patient.  Patient Care Team: Gildardo Cranker, DO as PCP - General (Internal Medicine) Nyoka Cowden Phylis Bougie, NP as Nurse Practitioner ([place]) Center, [place] ([place])  Indicate any recent [place] you may have received from other than [facility] providers in the past year (date may be approximate).     Assessment:   This is a routine wellness examination for [facility].   Hearing/Vision screen No exam data present  Dietary issues and exercise activities discussed: Current Exercise Habits: The patient does not participate in regular exercise at present, Exercise limited by: None identified  Goals    None     Depression Screen PHQ 2/9 Scores 07/23/2016 08/25/2012 08/04/2012 06/09/2012 05/06/2012 05/06/2012  PHQ - 2 Score 0 0 0 0 6 6  PHQ- 9  Score - - - - 9 10    Fall Risk Fall Risk  07/23/2016 08/25/2012 08/04/2012 06/09/2012 05/06/2012  Falls in the past year? No No No No No    Cognitive Function: MMSE - Mini Mental State Exam 07/23/2016  Not completed: Unable to complete        Screening Tests Health Maintenance  Topic Date Due  . FOOT EXAM  06/15/2017 (Originally 05/29/2016)  . INFLUENZA VACCINE  08/29/2016  . HEMOGLOBIN A1C  11/15/2016  . OPHTHALMOLOGY EXAM  01/08/2017  . DEXA SCAN  Completed      Plan:    I have personally reviewed and addressed the Medicare Annual Wellness questionnaire and have noted the following in the patient's chart:  A. Medical and social history B. Use of alcohol, tobacco or illicit drugs  C. Current medications and supplements D. Functional ability and status E.  Nutritional status F.  Physical activity G. Advance directives H. List of other physicians I.  Hospitalizations, surgeries, and ER visits in previous 12 months J.  [place] to include hearing, vision, cognitive, depression L. Referrals and appointments - none  In addition, I have reviewed and discussed with patient certain  preventive protocols, quality metrics, and best practice recommendations. A written personalized care plan for preventive services as well as general preventive health recommendations were provided to patient.  See attached scanned questionnaire for additional information.   Signed,   Rich Reining, RN Nurse Health Advisor   Quick Notes   Health Maintenance: Foot exam, TDAP, PNA 13 due     Abnormal Screen: Unable to complete mini mental     Patient Concerns: None     Nurse Concerns: None

## 2016-08-06 ENCOUNTER — Other Ambulatory Visit: Payer: Self-pay | Admitting: Adult Health

## 2016-08-06 ENCOUNTER — Encounter: Payer: Self-pay | Admitting: Adult Health

## 2016-08-06 ENCOUNTER — Non-Acute Institutional Stay (SKILLED_NURSING_FACILITY): Payer: Medicare Other | Admitting: Adult Health

## 2016-08-06 DIAGNOSIS — E1140 Type 2 diabetes mellitus with diabetic neuropathy, unspecified: Secondary | ICD-10-CM | POA: Diagnosis not present

## 2016-08-06 DIAGNOSIS — F0150 Vascular dementia without behavioral disturbance: Secondary | ICD-10-CM | POA: Diagnosis not present

## 2016-08-06 DIAGNOSIS — I480 Paroxysmal atrial fibrillation: Secondary | ICD-10-CM

## 2016-08-06 DIAGNOSIS — E1122 Type 2 diabetes mellitus with diabetic chronic kidney disease: Secondary | ICD-10-CM

## 2016-08-06 DIAGNOSIS — E1169 Type 2 diabetes mellitus with other specified complication: Secondary | ICD-10-CM

## 2016-08-06 DIAGNOSIS — I110 Hypertensive heart disease with heart failure: Secondary | ICD-10-CM

## 2016-08-06 DIAGNOSIS — N183 Chronic kidney disease, stage 3 (moderate): Secondary | ICD-10-CM | POA: Diagnosis not present

## 2016-08-06 DIAGNOSIS — I739 Peripheral vascular disease, unspecified: Secondary | ICD-10-CM

## 2016-08-06 DIAGNOSIS — E785 Hyperlipidemia, unspecified: Secondary | ICD-10-CM | POA: Diagnosis not present

## 2016-08-06 DIAGNOSIS — I5022 Chronic systolic (congestive) heart failure: Secondary | ICD-10-CM

## 2016-08-06 DIAGNOSIS — R911 Solitary pulmonary nodule: Secondary | ICD-10-CM

## 2016-08-06 NOTE — Progress Notes (Signed)
Location:   [place] Room Number: 106 A Place of Service:  SNF (31)   CODE STATUS: Full Code  Allergies  Allergen Reactions  . Penicillins Other (See Comments)    Patient states that she was told previously by a doctor to not take this medication but is unsure why.     Chief Complaint  Patient presents with  . Medical Management of Chronic Issues    1 month follow up    HPI:  She is a 76 year old resident of this facility being seen for the management of her chronic illnesses. There is little change in her status. She does spend nearly all of her time in per her choice. She is unable to fully participate in the hpi or ros; but tell me that she is feeling good.  There are no nursing concerns at this time  Past Medical History:  Diagnosis Date  . Abdominal or pelvic swelling, mass, or lump, left upper quadrant   . CHF (congestive heart failure) ([place])   . Chronic atrial fibrillation ([place])   . Chronic kidney disease   . Congestive heart failure, unspecified   . Coronary atherosclerosis of unspecified type of vessel, native or graft   . Diabetes mellitus   . Edema   . GERD (gastroesophageal reflux disease)   . Gout, unspecified   . Hypercholesterolemia   . Hypertension   . 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  . Peripheral vascular disease, unspecified ([place])   . Seizures ([place])   . Sinus of Valsalva aneurysm    a. By 2D echo 05/2011.  . Stroke ([place])   . Unspecified hereditary and idiopathic peripheral neuropathy   . Unspecified urinary incontinence   . Vascular dementia, uncomplicated     Past Surgical History:  Procedure Laterality Date  . AORTOGRAM  09/01/2008   Abd w/ bilateral lower extremity runoff arteriography  . FALSE ANEURYSM REPAIR  12/06/2011   Procedure: REPAIR FALSE ANEURYSM;  Surgeon:  Angelia Mould, MD;  Location: [facility] OR;  Service: Vascular;  Laterality: Left;  Repair of left femoral Artery pseudoaneurysm  . FEMORAL ARTERY EXPLORATION  12/06/2011   Procedure: FEMORAL ARTERY EXPLORATION;  Surgeon: Angelia Mould, MD;  Location: [facility] OR;  Service: Vascular;  Laterality: N/A;  Exploration of large pseudoaneurysm left side of fem-fem bypass graft   . femoral to femoral bypass graft     . FEMORAL-FEMORAL BYPASS GRAFT  12/06/2011   Procedure: BYPASS GRAFT FEMORAL-FEMORAL ARTERY;  Surgeon: Angelia Mould, MD;  Location: [facility] OR;  Service: Vascular;  Laterality: Bilateral;  Revision of left to right Femoral-Femoral bypass graft  . FEMORAL-POPLITEAL BYPASS GRAFT Left 05/2002   lower extremity femoral to below knee w/ non-versed greater saphenous vein  . FEMORAL-POPLITEAL BYPASS GRAFT Left 11/2002  . TUBAL LIGATION Bilateral     Social History   Social History  . Marital status: Widowed    Spouse name: N/A  . Number of children: N/A  . Years of education: N/A   Occupational History  . Not on file.   Social History Main Topics  . Smoking status: Former Smoker    Types: Cigarettes    Quit date: 01/29/2001  . Smokeless tobacco: Never Used  . Alcohol use No  . Drug use: No  . Sexual activity: No   Other Topics Concern  .  Not on file   Social History Narrative  . No narrative on file   Family History  Problem Relation Age of Onset  . Cancer Mother        Cervical  . Stroke Mother   . Diabetes Mother   . Stroke Father   . Heart disease Father       VITAL SIGNS BP 128/80   Pulse 78   Ht 5' (1.524 m)   Wt 124 lb 8 oz (56.5 kg)   BMI 24.31 kg/m   Patient's Medications  New Prescriptions   No medications on file  Previous Medications   ALLOPURINOL (ZYLOPRIM) 100 MG TABLET    Take 100 mg by mouth daily.   AMINO ACIDS-PROTEIN HYDROLYS (FEEDING SUPPLEMENT, PRO-STAT SUGAR FREE 64,) LIQD    Take 30 mLs by mouth 2 (two) times daily.    AMLODIPINE  (NORVASC) 2.5 MG TABLET    Take 2.5 mg by mouth daily.   ASPIRIN 81 MG TABLET    Take 81 mg by mouth daily.   ATORVASTATIN (LIPITOR) 10 MG TABLET    Take 10 mg by mouth at bedtime.    DONEPEZIL (ARICEPT) 10 MG TABLET    Take 1 tablet (10 mg total) by mouth at bedtime.   ENALAPRIL (VASOTEC) 5 MG TABLET    Take 5 mg by mouth daily.    LEVETIRACETAM (KEPPRA) 250 MG TABLET    Take 250 mg by mouth 2 (two) times daily.   METOPROLOL (LOPRESSOR) 50 MG TABLET    Take 50 mg by mouth 2 (two) times daily. Hold if pulse  < 60   MIRTAZAPINE (REMERON) 7.5 MG TABLET    Take 7.5 mg by mouth at bedtime.   NUTRITIONAL SUPPLEMENTS (NUTRITIONAL SUPPLEMENT PO)    Give by mouth House Shakes 4 oz 3 times daily   OXYBUTYNIN (DITROPAN) 5 MG TABLET    Take 1 tablet (5 mg total) by mouth 2 (two) times daily.   SERTRALINE (ZOLOFT) 50 MG TABLET    Take 50 mg by mouth daily.    UNABLE TO FIND    Med Name: Med Pass 2 give 120 ml three times daily by mouth. HSG Mech Soft texture  Modified Medications   No medications on file  Discontinued Medications   No medications on file     SIGNIFICANT DIAGNOSTIC EXAMS  05-21-14: ct of chest: 1. Right upper lobe 5 mm nodule unchanged. 2. Additional nodules identified, some of which are noncalcified andvalso warrants follow-up. 3. Persistent mediastinal and hilar adenopathy. This may bevlong-standing given the previous reports of adenopathy. At the [facility] next exam, consider contrast-enhanced exam for further evaluation of the lymph nodes. 4. Given patient's history of smoking, follow-up chest CT at 6-12vmonths is recommended. 5. Coronary artery disease. . 01-10-15: ct of chest; 1. No acute findings. 2. Stable lung nodules as detailed above, the largest in the right upper lobe measuring 5 mm. Recommend additional unenhanced chest CT follow-up and 18 months per Fleischner criteria for following pulmonary nodules.      LABS REVIEWED:    09-14-15: hgb a1c 5.6 09-20-15: glucose 85;  bun 26.8; creat 1.04; k+ 4.5; na++144; mag 1.7; dig 0.68  11-10-15; wbc 6.7 hgb 12.2; hct 37.3; mcv 86.0; plt 209; gucose 83; bun 24.1; creat 1.24; k+ 5.5; na++ 144; (creat clear 31.49); liver normal albumin 3.8; chol 117; ldl 41; trig 131; hdl 50   Urine micro-albumin <1.2  02-03-16: wbc 6.5; hgb 11.8; hct 38.9;mcv 92.7; plt 171;  glucose 101; bun 29.2; creat 1.14; k+ 4.5; na++ 147; ca 10.4; (creat clear 34.25); liver normal albumin 3.8  05-16-16: wbc 6.9; hgb 11.7; ht 38.3; mcv 87.8; plt 186; glucose 107; bun 32.2; creat 1.08; k+ 4.4; na++ 142; ca 9.8; hgb a1c 6.9  06-26-16: tsh 4.68; dig <0.40   Review of Systems Unable to perform ROS: Dementia     Physical Exam  Constitutional: No distress.  Frail   Eyes: Conjunctivae are normal.  Is legally blind   Neck: Neck supple. No JVD present. No thyromegaly present.  Cardiovascular: Normal rate, regular rhythm and intact distal pulses.   Respiratory: Effort normal and breath sounds normal. No respiratory distress. She has no wheezes.  GI: Soft. Bowel sounds are normal. She exhibits no distension. There is no tenderness.  Musculoskeletal: She exhibits no edema.  Has right hemiparesis Is able to move left extremities  Lymphadenopathy:    She has no cervical adenopathy.  Neurological: She is alert.  Skin: Skin is warm and dry. She is not diaphoretic.  Psychiatric: She has a normal mood and affect.     ASSESSMENT/ PLAN:  1. Gout no recent flares will continue allopurinol 100 mg daily   2. Hypertension: b/p 120/80  will continue  lopressor 50 mg twice daily vasotec  5 mg daily and norvasc 2.5 mg daily and will monitor   3. Afib: heart rate regular will continue  Asa 81 mg daily; lopressor 50 mg twice daily for rate control.  Her digoxin was stopped due to low pulse rate.   4. Dementia: is without change will continue aricept 10 mg daily; will monitor her weight is 124 pounds will need to continue to monitor   5. Diabetes:  hgb a1c is 6.9    She is off lantus and  metformin   6. Chronic systolic heart failure:  Ef is 35-40% (02-03-13)  is stable will not make changes will monitor   7. Dyslipidemia: will continue lipitor 10 mg daily ldl is 41; trig 131   8. Seizures: no reports of recent activity present; will continue keppra 250 mg twice daily  9. UI: will stop ditropan; I doubt this medication is providing her any benefit   10. Depression: will continue zoloft 50 mg daily   11. Pulmonary nodule: will continue ct scans every 18  months and will monitor her status.   Her ct scan was done Dec 2016   Will due again in June 2018    12. CVA: is neurologically stable; has right hemiparesis; will continue asa 81 mg daily   13. Weight loss: her current weight is 124 pounds; will continue remeron 7.5 mg nightly  will continue supplements per facility protocol and will monitor  14. PVD: is status post femoral-femoral and femoral-popliteal bypass: will continue asa 81 mg daily   15. Stage III chronic kidney disease: creat clear 34.25; bun 29.2; creat 1.14    MD is aware of resident's narcotic use and is in agreement with current plan of care. We will attempt to wean resident as apropriate    Ok Edwards NP [facility]  Contact [phone] Monday through Friday 8am- 5pm  After hours call [phone]

## 2016-08-07 LAB — LIPID PANEL
CHOLESTEROL: 141 (ref 0–200)
HDL: 53 (ref 35–70)
LDL Cholesterol: 59
TRIGLYCERIDES: 145 (ref 40–160)

## 2016-08-07 LAB — HEPATIC FUNCTION PANEL
ALT: 10 (ref 7–35)
AST: 14 (ref 13–35)
Alkaline Phosphatase: 94 (ref 25–125)
BILIRUBIN, TOTAL: 0.3

## 2016-08-07 LAB — BASIC METABOLIC PANEL
BUN: 28 — AB (ref 4–21)
CREATININE: 1.4 — AB (ref 0.5–1.1)
Glucose: 98
POTASSIUM: 4.2 (ref 3.4–5.3)
Sodium: 142 (ref 137–147)

## 2016-08-09 ENCOUNTER — Ambulatory Visit
Admission: RE | Admit: 2016-08-09 | Discharge: 2016-08-09 | Disposition: A | Discharge status: Home / Self Care | Payer: Medicare Other | Source: Ambulatory Visit | Attending: Adult Health | Admitting: Adult Health

## 2016-08-09 DIAGNOSIS — R911 Solitary pulmonary nodule: Secondary | ICD-10-CM

## 2016-08-09 MED ORDER — IOPAMIDOL (ISOVUE-300) INJECTION 61%: 75.0000 mL | Freq: Once | INTRAVENOUS | Status: DC | PRN

## 2016-08-29 ENCOUNTER — Emergency Department (HOSPITAL_COMMUNITY): Payer: Medicare Other

## 2016-08-29 ENCOUNTER — Inpatient Hospital Stay (HOSPITAL_COMMUNITY)
Admission: EM | Admit: 2016-08-29 | Discharge: 2016-09-02 | DRG: 253 | Disposition: A | Discharge status: Skilled Nursing Facility | Payer: Medicare Other | Attending: Internal Medicine | Admitting: Internal Medicine

## 2016-08-29 ENCOUNTER — Encounter (HOSPITAL_COMMUNITY): Payer: Self-pay

## 2016-08-29 DIAGNOSIS — I4891 Unspecified atrial fibrillation: Secondary | ICD-10-CM | POA: Diagnosis present

## 2016-08-29 DIAGNOSIS — T81718A Complication of other artery following a procedure, not elsewhere classified, initial encounter: Principal | ICD-10-CM | POA: Diagnosis present

## 2016-08-29 DIAGNOSIS — F329 Major depressive disorder, single episode, unspecified: Secondary | ICD-10-CM | POA: Diagnosis present

## 2016-08-29 DIAGNOSIS — E1140 Type 2 diabetes mellitus with diabetic neuropathy, unspecified: Secondary | ICD-10-CM | POA: Diagnosis present

## 2016-08-29 DIAGNOSIS — F0390 Unspecified dementia without behavioral disturbance: Secondary | ICD-10-CM | POA: Diagnosis present

## 2016-08-29 DIAGNOSIS — I2510 Atherosclerotic heart disease of native coronary artery without angina pectoris: Secondary | ICD-10-CM | POA: Diagnosis present

## 2016-08-29 DIAGNOSIS — F0150 Vascular dementia without behavioral disturbance: Secondary | ICD-10-CM | POA: Insufficient documentation

## 2016-08-29 DIAGNOSIS — I69351 Hemiplegia and hemiparesis following cerebral infarction affecting right dominant side: Secondary | ICD-10-CM

## 2016-08-29 DIAGNOSIS — Z823 Family history of stroke: Secondary | ICD-10-CM

## 2016-08-29 DIAGNOSIS — Z880 Allergy status to penicillin: Secondary | ICD-10-CM

## 2016-08-29 DIAGNOSIS — N200 Calculus of kidney: Secondary | ICD-10-CM

## 2016-08-29 DIAGNOSIS — G609 Hereditary and idiopathic neuropathy, unspecified: Secondary | ICD-10-CM | POA: Diagnosis present

## 2016-08-29 DIAGNOSIS — Z79899 Other long term (current) drug therapy: Secondary | ICD-10-CM

## 2016-08-29 DIAGNOSIS — E7800 Pure hypercholesterolemia, unspecified: Secondary | ICD-10-CM | POA: Diagnosis present

## 2016-08-29 DIAGNOSIS — D62 Acute posthemorrhagic anemia: Secondary | ICD-10-CM | POA: Diagnosis not present

## 2016-08-29 DIAGNOSIS — I70219 Atherosclerosis of native arteries of extremities with intermittent claudication, unspecified extremity: Secondary | ICD-10-CM | POA: Diagnosis present

## 2016-08-29 DIAGNOSIS — E1122 Type 2 diabetes mellitus with diabetic chronic kidney disease: Secondary | ICD-10-CM | POA: Diagnosis present

## 2016-08-29 DIAGNOSIS — Z8249 Family history of ischemic heart disease and other diseases of the circulatory system: Secondary | ICD-10-CM

## 2016-08-29 DIAGNOSIS — I743 Embolism and thrombosis of arteries of the lower extremities: Secondary | ICD-10-CM | POA: Diagnosis present

## 2016-08-29 DIAGNOSIS — Z8572 Personal history of non-Hodgkin lymphomas: Secondary | ICD-10-CM

## 2016-08-29 DIAGNOSIS — Z7982 Long term (current) use of aspirin: Secondary | ICD-10-CM

## 2016-08-29 DIAGNOSIS — I480 Paroxysmal atrial fibrillation: Secondary | ICD-10-CM | POA: Diagnosis present

## 2016-08-29 DIAGNOSIS — R569 Unspecified convulsions: Secondary | ICD-10-CM | POA: Diagnosis present

## 2016-08-29 DIAGNOSIS — I130 Hypertensive heart and chronic kidney disease with heart failure and stage 1 through stage 4 chronic kidney disease, or unspecified chronic kidney disease: Secondary | ICD-10-CM | POA: Diagnosis present

## 2016-08-29 DIAGNOSIS — Z87891 Personal history of nicotine dependence: Secondary | ICD-10-CM

## 2016-08-29 DIAGNOSIS — I729 Aneurysm of unspecified site: Secondary | ICD-10-CM

## 2016-08-29 DIAGNOSIS — E1151 Type 2 diabetes mellitus with diabetic peripheral angiopathy without gangrene: Secondary | ICD-10-CM | POA: Diagnosis present

## 2016-08-29 DIAGNOSIS — K5900 Constipation, unspecified: Secondary | ICD-10-CM | POA: Diagnosis present

## 2016-08-29 DIAGNOSIS — R911 Solitary pulmonary nodule: Secondary | ICD-10-CM | POA: Diagnosis present

## 2016-08-29 DIAGNOSIS — I724 Aneurysm of artery of lower extremity: Secondary | ICD-10-CM | POA: Diagnosis present

## 2016-08-29 DIAGNOSIS — E785 Hyperlipidemia, unspecified: Secondary | ICD-10-CM | POA: Diagnosis present

## 2016-08-29 DIAGNOSIS — Z833 Family history of diabetes mellitus: Secondary | ICD-10-CM

## 2016-08-29 DIAGNOSIS — I5022 Chronic systolic (congestive) heart failure: Secondary | ICD-10-CM | POA: Diagnosis present

## 2016-08-29 DIAGNOSIS — M109 Gout, unspecified: Secondary | ICD-10-CM | POA: Diagnosis present

## 2016-08-29 DIAGNOSIS — I110 Hypertensive heart disease with heart failure: Secondary | ICD-10-CM | POA: Diagnosis present

## 2016-08-29 DIAGNOSIS — Z993 Dependence on wheelchair: Secondary | ICD-10-CM

## 2016-08-29 DIAGNOSIS — I482 Chronic atrial fibrillation: Secondary | ICD-10-CM | POA: Diagnosis present

## 2016-08-29 DIAGNOSIS — N132 Hydronephrosis with renal and ureteral calculous obstruction: Secondary | ICD-10-CM | POA: Diagnosis present

## 2016-08-29 DIAGNOSIS — R34 Anuria and oliguria: Secondary | ICD-10-CM | POA: Diagnosis not present

## 2016-08-29 DIAGNOSIS — I6930 Unspecified sequelae of cerebral infarction: Secondary | ICD-10-CM

## 2016-08-29 DIAGNOSIS — N183 Chronic kidney disease, stage 3 (moderate): Secondary | ICD-10-CM | POA: Diagnosis present

## 2016-08-29 DIAGNOSIS — K219 Gastro-esophageal reflux disease without esophagitis: Secondary | ICD-10-CM | POA: Diagnosis present

## 2016-08-29 DIAGNOSIS — Y832 Surgical operation with anastomosis, bypass or graft as the cause of abnormal reaction of the patient, or of later complication, without mention of misadventure at the time of the procedure: Secondary | ICD-10-CM | POA: Diagnosis present

## 2016-08-29 DIAGNOSIS — H548 Legal blindness, as defined in USA: Secondary | ICD-10-CM | POA: Diagnosis present

## 2016-08-29 DIAGNOSIS — E784 Other hyperlipidemia: Secondary | ICD-10-CM | POA: Diagnosis present

## 2016-08-29 LAB — URINALYSIS, ROUTINE W REFLEX MICROSCOPIC
Bilirubin Urine: NEGATIVE
Glucose, UA: NEGATIVE mg/dL
KETONES UR: NEGATIVE mg/dL
NITRITE: NEGATIVE
PROTEIN: 100 mg/dL — AB
Specific Gravity, Urine: 1.012 (ref 1.005–1.030)
pH: 7 (ref 5.0–8.0)

## 2016-08-29 LAB — CBC WITH DIFFERENTIAL/PLATELET
BASOS ABS: 0 10*3/uL (ref 0.0–0.1)
BASOS PCT: 1 %
EOS ABS: 0.2 10*3/uL (ref 0.0–0.7)
EOS PCT: 3 %
HCT: 38.7 % (ref 36.0–46.0)
Hemoglobin: 13 g/dL (ref 12.0–15.0)
Lymphocytes Relative: 20 %
Lymphs Abs: 1.7 10*3/uL (ref 0.7–4.0)
MCH: 26.7 pg (ref 26.0–34.0)
MCHC: 33.6 g/dL (ref 30.0–36.0)
MCV: 79.5 fL (ref 78.0–100.0)
Monocytes Absolute: 0.8 10*3/uL (ref 0.1–1.0)
Monocytes Relative: 10 %
Neutro Abs: 5.6 10*3/uL (ref 1.7–7.7)
Neutrophils Relative %: 66 %
PLATELETS: 179 10*3/uL (ref 150–400)
RBC: 4.87 MIL/uL (ref 3.87–5.11)
RDW: 15.8 % — ABNORMAL HIGH (ref 11.5–15.5)
WBC: 8.3 10*3/uL (ref 4.0–10.5)

## 2016-08-29 LAB — COMPREHENSIVE METABOLIC PANEL
ALT: 15 U/L (ref 14–54)
AST: 18 U/L (ref 15–41)
Albumin: 3.6 g/dL (ref 3.5–5.0)
Alkaline Phosphatase: 90 U/L (ref 38–126)
Anion gap: 9 (ref 5–15)
BUN: 28 mg/dL — AB (ref 6–20)
CHLORIDE: 105 mmol/L (ref 101–111)
CO2: 26 mmol/L (ref 22–32)
CREATININE: 1.2 mg/dL — AB (ref 0.44–1.00)
Calcium: 9.8 mg/dL (ref 8.9–10.3)
GFR calc Af Amer: 50 mL/min — ABNORMAL LOW (ref >60)
GFR calc non Af Amer: 43 mL/min — ABNORMAL LOW (ref >60)
Glucose, Bld: 132 mg/dL — ABNORMAL HIGH (ref 65–99)
Potassium: 4.2 mmol/L (ref 3.5–5.1)
SODIUM: 140 mmol/L (ref 135–145)
Total Bilirubin: 0.7 mg/dL (ref 0.3–1.2)
Total Protein: 7.4 g/dL (ref 6.5–8.1)

## 2016-08-29 MED ORDER — IOPAMIDOL (ISOVUE-300) INJECTION 61%
INTRAVENOUS | Status: AC
  Filled 2016-08-29: qty 100

## 2016-08-29 MED ORDER — IOPAMIDOL (ISOVUE-300) INJECTION 61%
100.0000 mL | Freq: Once | INTRAVENOUS | Status: AC | PRN
  Administered 2016-08-29: 100 mL via INTRAVENOUS

## 2016-08-29 MED ORDER — SODIUM CHLORIDE 0.9 % IV BOLUS (SEPSIS)
1000.0000 mL | Freq: Once | INTRAVENOUS | Status: AC
  Administered 2016-08-29: 1000 mL via INTRAVENOUS

## 2016-08-29 NOTE — ED Provider Notes (Signed)
[facility] DEPT Provider Note   CSN: [id number] Arrival date & time: 08/29/16  1843     History   Chief Complaint Chief Complaint  Patient presents with  . groin mass    left    HPI Casey [name] is a 76 y.o. female.  HPI   Level V caveat dementia.  76 year old female with a history of CHF, atrial fibrillation, CK D, diabetes, coronary artery disease, hypertension, hyperlipidemia, lymphoma, seizures, legally blind, dementia, presents with concern for abdominal mass.  Patient reports that she's had abdominal distention for approximately 2 days. She reports normal bowel movements, denies nausea, vomiting, fevers, urinary symptoms. It is unclear she is a reliable historian given her history of dementia.   Past Medical History:  Diagnosis Date  . Abdominal or pelvic swelling, mass, or lump, left upper quadrant   . CHF (congestive heart failure) ([place])   . Chronic atrial fibrillation ([place])   . Chronic kidney disease   . Congestive heart failure, unspecified   . Coronary atherosclerosis of unspecified type of vessel, native or graft   . Diabetes mellitus   . Edema   . GERD (gastroesophageal reflux disease)   . Gout, unspecified   . Hypercholesterolemia   . Hypertension   . 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  . Peripheral vascular disease, unspecified ([place])   . Seizures ([place])   . Sinus of Valsalva aneurysm    a. By 2D echo 05/2011.  . Stroke ([place])   . Unspecified hereditary and idiopathic peripheral neuropathy   . Unspecified urinary incontinence   . Vascular dementia, uncomplicated     Patient Active Problem List   Diagnosis Date Noted  . 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 Vascular dementia without behavioral disturbance 08/29/2016  . History of CVA with residual deficit 08/29/2016  . Type  2 diabetes mellitus with diabetic neuropathy, without long-term current use of insulin ([place]) 08/29/2016  . Depression 04/24/2016  . Loss of weight 04/24/2016  . Hypertensive heart disease with congestive heart failure ([place]) 12/30/2014  . Dyslipidemia associated with type 2 diabetes mellitus ([place]) 12/30/2014  . CKD stage 3 due to type 2 diabetes mellitus ([place]) 12/06/2014  . 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 Chronic systolic congestive heart failure ([place]) 02/09/2013  . Solitary pulmonary nodule 02/09/2013  . Seizures ([place])   . Vision disturbance following CVA (cerebrovascular accident) 02/02/2013  . PVD (peripheral vascular disease) ([place]) 09/30/2012  . Gout of multiple sites 05/06/2012  . Dementia without behavioral disturbance 05/06/2012  . Atherosclerosis of native artery of extremity with intermittent claudication ([place]) 02/20/2012  . Type 2 diabetes mellitus with diabetic neuropathy ([place]) 12/29/2010  . Atrial fibrillation ([place])   . Late effects of CVA (cerebrovascular accident)     Past Surgical History:  Procedure Laterality Date  . AORTOGRAM  09/01/2008   Abd w/ bilateral lower extremity runoff arteriography  . FALSE ANEURYSM REPAIR  12/06/2011   Procedure: REPAIR FALSE ANEURYSM;  Surgeon: Angelia Mould, MD;  Location: [facility] OR;  Service: Vascular;  Laterality: Left;  Repair of left femoral Artery pseudoaneurysm  . FEMORAL ARTERY EXPLORATION  12/06/2011   Procedure: FEMORAL ARTERY EXPLORATION;  Surgeon: Angelia Mould, MD;  Location: [facility] OR;  Service: Vascular;  Laterality: N/A;  Exploration of large pseudoaneurysm left side of fem-fem bypass graft   . femoral to  femoral bypass graft     . FEMORAL-FEMORAL BYPASS GRAFT  12/06/2011   Procedure: BYPASS GRAFT FEMORAL-FEMORAL ARTERY;  Surgeon: Angelia Mould, MD;  Location: [facility] OR;  Service: Vascular;  Laterality: Bilateral;  Revision of left to right Femoral-Femoral bypass graft  . FEMORAL-POPLITEAL BYPASS GRAFT Left 05/2002    lower extremity femoral to below knee w/ non-versed greater saphenous vein  . FEMORAL-POPLITEAL BYPASS GRAFT Left 11/2002  . TUBAL LIGATION Bilateral     OB History    No data available       Home Medications    Prior to Admission medications   Medication Sig Start Date End Date Taking? Authorizing Provider  allopurinol (ZYLOPRIM) 100 MG tablet Take 100 mg by mouth daily.    [provider]  Amino Acids-Protein Hydrolys (FEEDING SUPPLEMENT, PRO-STAT SUGAR FREE 64,) LIQD Take 30 mLs by mouth 2 (two) times daily.     [provider]  amLODipine (NORVASC) 2.5 MG tablet Take 2.5 mg by mouth daily.    [provider]  aspirin 81 MG tablet Take 81 mg by mouth daily.    [provider]  atorvastatin (LIPITOR) 10 MG tablet Take 10 mg by mouth at bedtime.     [provider]  donepezil (ARICEPT) 10 MG tablet Take 1 tablet (10 mg total) by mouth at bedtime. 02/07/13   Isaac Bliss, Rayford Halsted, MD  enalapril (VASOTEC) 5 MG tablet Take 5 mg by mouth daily.     [provider]  levETIRAcetam (KEPPRA) 250 MG tablet Take 250 mg by mouth 2 (two) times daily.    [provider]  metoprolol tartrate (LOPRESSOR) 25 MG tablet Take 50 mg by mouth every morning. And 25mg  at night. Hold if pulse  < 60    [provider]  mirtazapine (REMERON) 7.5 MG tablet Take 7.5 mg by mouth at bedtime.    [provider]  Nutritional Supplements (NUTRITIONAL SUPPLEMENT PO) Give by mouth House Shakes 4 oz 3 times daily    [provider]  sertraline (ZOLOFT) 50 MG tablet Take 50 mg by mouth daily.     [provider]  UNABLE TO FIND Med Name: Med Pass 2 give 120 ml three times daily by mouth. HSG Mech Soft texture    [provider]    Family History Family History  Problem Relation Age of Onset  . Cancer Mother        Cervical  . Stroke Mother   . Diabetes Mother   . Stroke Father   . Heart disease Father       Social History Social History  Substance Use Topics  . Smoking status: Former Smoker    Types: Cigarettes    Quit date: 01/29/2001  . Smokeless tobacco: Never Used  . Alcohol use No     Allergies   Penicillins   Review of Systems Review of Systems  Unable to perform ROS: Dementia  Constitutional: Negative for fever.  HENT: Negative for sore throat.   Eyes: Negative for visual disturbance.  Respiratory: Negative for cough and shortness of breath.   Cardiovascular: Negative for chest pain.  Gastrointestinal: Positive for abdominal pain. Negative for nausea and vomiting.  Genitourinary: Negative for difficulty urinating.  Musculoskeletal: Negative for back pain.  Skin: Negative for rash.  Neurological: Negative for syncope and headaches.     Physical Exam Updated Vital Signs BP (!) 173/111   Pulse 74   Temp 98.4 F (36.9 C) (Oral)  Resp 18   Ht 5\' 5"  (1.651 m)   Wt 59 kg (130 lb)   SpO2 94%   BMI 21.63 kg/m   Physical Exam  Constitutional: She is oriented to person, place, and time. She appears well-developed and well-nourished. No distress.  HENT:  Head: Normocephalic and atraumatic.  Eyes: Conjunctivae and EOM are normal.  Neck: Normal range of motion.  Cardiovascular: Normal rate, regular rhythm and normal heart sounds.  Exam reveals no gallop and no friction rub.   No murmur heard. Pulmonary/Chest: Effort normal and breath sounds normal. No respiratory distress. She has no wheezes. She has no rales.  Abdominal: Soft. She exhibits no distension. There is tenderness (severe tenderness over left lower quadrant, suprapubic). There is no guarding.  Swelling/induration to left groin with extension towards suprapubic area overlying fem-fem graft Pulsatile  Musculoskeletal: She exhibits no edema or tenderness.  Neurological: She is alert and oriented to person, place, and time.  Skin: Skin is warm and dry. No rash noted. She is not diaphoretic. No erythema.   Nursing note and vitals reviewed.    ED Treatments / Results  Labs (all labs ordered are listed, but only abnormal results are displayed) Labs Reviewed  CBC WITH DIFFERENTIAL/PLATELET - Abnormal; Notable for the following:       Result Value   RDW 15.8 (*)    All other components within normal limits  COMPREHENSIVE METABOLIC PANEL - Abnormal; Notable for the following:    Glucose, Bld 132 (*)    BUN 28 (*)    Creatinine, Ser 1.20 (*)    GFR calc non Af Amer 43 (*)    GFR calc Af Amer 50 (*)    All other components within normal limits  URINALYSIS, ROUTINE W REFLEX MICROSCOPIC - Abnormal; Notable for the following:    Hgb urine dipstick SMALL (*)    Protein, ur 100 (*)    Leukocytes, UA TRACE (*)    Bacteria, UA MANY (*)    Squamous Epithelial / LPF 0-5 (*)    All other components within normal limits  URINE CULTURE  PROTIME-INR    EKG  EKG Interpretation None       Radiology Ct Abdomen Pelvis W Contrast  Result Date: 08/29/2016 CLINICAL DATA:  Initial evaluation for acute abdominal pain, left rolling mass. EXAM: CT ABDOMEN AND PELVIS WITH CONTRAST TECHNIQUE: Multidetector CT imaging of the abdomen and pelvis was performed using the standard protocol following bolus administration of intravenous contrast. CONTRAST:  158mL ISOVUE-300 IOPAMIDOL (ISOVUE-300) INJECTION 61% COMPARISON:  Prior CT from 12/04/2011. FINDINGS: Lower chest: Scattered atelectatic changes noted within the visualized lung bases. Cardiomegaly partially visualized. No pleural or pericardial effusion. Hepatobiliary: Liver demonstrates a normal contrast enhanced appearance. Gallbladder within normal limits. No biliary dilatation. Pancreas: Pancreas somewhat atrophic but otherwise unremarkable without acute inflammation or mass lesion. Spleen: Subcentimeter hypodensity noted within central aspect of the spleen, of doubtful significance. Spleen otherwise unremarkable. Adrenals/Urinary Tract: Adrenal glands within  normal limits. Kidneys relatively equal in size with symmetric enhancement. Scattered cortical thinning and scarring. 5.6 cm cyst noted extending from the lower pole left kidney. Additional scattered subcentimeter hypodensities within the bilateral kidneys too small the characterize, but statistically likely reflects small cysts as well. Multiple nonobstructive calculi present within the right kidney, largest of which positioned within the lower pole and measures approximately 15 mm. Mild right hydroureteronephrosis. There is an obstructive 6 mm calculus at the right UVJ (series 2, image 64). No left-sided renal calculi. No  left-sided hydronephrosis or hydroureter. Bladder largely decompressed. Mild circumferential bladder wall thickening like related incomplete distension. Stomach/Bowel: Stomach within normal limits. No evidence for bowel obstruction. Appendix within normal limits. No acute inflammatory changes seen about the bowels. Large volume stool seen packed within the rectal vault, suggesting constipation. Vascular/Lymphatic: Extensive atherosclerosis throughout the intra-abdominal aorta and its branch vessels. Origin of the celiac axis, SMA, renal arteries, and IMA are grossly patent. Occlusion of the left common iliac just distal to its origin (series 2, image 42). Left internal iliac occluded proximally, with distal reconstitution likely via collateralization. Occluded vascular stent in place within the left external iliac artery. Extensive atherosclerotic disease throughout the right iliac system. Fem-fem bypass graft in place. That right common femoral anastomosis appears patent. Large pseudoaneurysm measuring 4.4 x 5.7 x 7.5 cm arises from the left common femoral anastomosis in the left inguinal region (series 2, image 63). Opacification of the left common femoral artery distally. Reproductive: Scattered calcified uterine fibroids noted. Uterus and ovaries otherwise within normal limits. Other: No free  air or fluid. Musculoskeletal: Small intramuscular lipoma noted within the left abdominus musculature. No acute osseous abnormality. No worrisome lytic or blastic osseous lesions. IMPRESSION: 1. 4.4 x 5.7 x 7.5 cm pseudoaneurysm arising from the left aspect of a fem-fem bypass graft in the left inguinal region. Finding suggestive of graft failure. Superimposed infection not excluded. 2. Extensive atherosclerotic calcifications throughout the intra-abdominal aorta and its branch vessels. 3. 6 mm calculus at the right UVJ with secondary mild right hydroureteronephrosis. Additional nonobstructive right renal nephrolithiasis as above. 4. Large volume stool within the rectal vault, suggesting constipation. Critical Value/emergent results were called by telephone at the time of interpretation on 08/29/2016 at 11:29 pm to Dr. Gareth Morgan , who verbally acknowledged these results. Electronically Signed   By: Jeannine Boga M.D.   On: 08/29/2016 23:34    Procedures Procedures (including critical care time)  Medications Ordered in ED Medications  iopamidol (ISOVUE-300) 61 % injection (not administered)  labetalol (NORMODYNE,TRANDATE) injection 20 mg (not administered)  sodium chloride 0.9 % bolus 1,000 mL (0 mLs Intravenous Stopped 08/29/16 2327)  iopamidol (ISOVUE-300) 61 % injection 100 mL (100 mLs Intravenous Contrast Given 08/29/16 2249)   CRITICAL CARE Performed by: Alvino Chapel   Total critical care time: 30 minutes  Critical care time was exclusive of separately billable procedures and treating other patients.  Critical care was necessary to treat or prevent imminent or life-threatening deterioration.  Critical care was time spent personally by me on the following activities: development of treatment plan with patient and/or surrogate as well as nursing, discussions with consultants, evaluation of patient's response to treatment, examination of patient, obtaining history from  patient or surrogate, ordering and performing treatments and interventions, ordering and review of laboratory studies, ordering and review of radiographic studies, pulse oximetry and re-evaluation of patient's condition.  Initial Impression / Assessment and Plan / ED Course  I have reviewed the triage vital signs and the nursing notes.  Pertinent labs & imaging results that were available during my care of the patient were reviewed by me and considered in my medical decision making (see chart for details).     76 year old female with a history of CHF, atrial fibrillation, CK D, diabetes, coronary artery disease, hypertension, hyperlipidemia, lymphoma, seizures, legally blind, dementia, History of femorofemoral bypass graft in 2013 with repair of left pseudoaneurysm, presents with concern for abdominal mass. Swelling initially thought to be possible hematoma or abscess, pulsatile nature  thought to be due to respiratory variation.  CT done shows large left inguinal pseudoaneurysm. Discussed with Dr. Trula Slade. Recommend transfer to [facility] vascular unit stepdown under hospitalist care.  Outlined area of swelling. Lower extremities warm, perfused.  Discussed with Dr. Loleta Books.   CT also shows small stone possibly in UVJ. Given small size, patient asymptomatic, feel this will likely pass and does not require specific urology consultation at this time.  Dr. Trula Slade recommends ED-ED transfer and OR for patient.    Final Clinical Impressions(s) / ED Diagnoses   Final diagnoses:  Pseudoaneurysm of femoral artery (HCC)  Constipation, unspecified constipation type  Nephrolithiasis    New Prescriptions New Prescriptions   No medications on file     Gareth Morgan, MD 08/30/16 [phone]

## 2016-08-29 NOTE — ED Notes (Signed)
Bed: EO71 Expected date:  Expected time:  Means of arrival:  Comments: 76 yo f left groin mass

## 2016-08-29 NOTE — ED Triage Notes (Signed)
Per EMS- Patient is a resident of [facility]. Staff called EMS and reported a left groin mass that they discovered approx 1 hour go. Left groin mass is tender to touch. Patient is legally blind, has history of dementia and violent behavior.

## 2016-08-30 ENCOUNTER — Encounter (HOSPITAL_COMMUNITY)
Admission: EM | Disposition: A | Discharge status: Skilled Nursing Facility | Payer: Self-pay | Source: Home / Self Care | Attending: Internal Medicine

## 2016-08-30 ENCOUNTER — Encounter (HOSPITAL_COMMUNITY): Payer: Self-pay | Admitting: Family Medicine

## 2016-08-30 ENCOUNTER — Inpatient Hospital Stay (HOSPITAL_COMMUNITY): Payer: Medicare Other | Admitting: Certified Registered"

## 2016-08-30 DIAGNOSIS — E7800 Pure hypercholesterolemia, unspecified: Secondary | ICD-10-CM | POA: Diagnosis present

## 2016-08-30 DIAGNOSIS — I110 Hypertensive heart disease with heart failure: Secondary | ICD-10-CM

## 2016-08-30 DIAGNOSIS — I70213 Atherosclerosis of native arteries of extremities with intermittent claudication, bilateral legs: Secondary | ICD-10-CM | POA: Diagnosis not present

## 2016-08-30 DIAGNOSIS — F0150 Vascular dementia without behavioral disturbance: Secondary | ICD-10-CM

## 2016-08-30 DIAGNOSIS — I743 Embolism and thrombosis of arteries of the lower extremities: Secondary | ICD-10-CM | POA: Diagnosis present

## 2016-08-30 DIAGNOSIS — I130 Hypertensive heart and chronic kidney disease with heart failure and stage 1 through stage 4 chronic kidney disease, or unspecified chronic kidney disease: Secondary | ICD-10-CM | POA: Diagnosis present

## 2016-08-30 DIAGNOSIS — R569 Unspecified convulsions: Secondary | ICD-10-CM

## 2016-08-30 DIAGNOSIS — I69351 Hemiplegia and hemiparesis following cerebral infarction affecting right dominant side: Secondary | ICD-10-CM | POA: Diagnosis not present

## 2016-08-30 DIAGNOSIS — N183 Chronic kidney disease, stage 3 (moderate): Secondary | ICD-10-CM | POA: Diagnosis not present

## 2016-08-30 DIAGNOSIS — E785 Hyperlipidemia, unspecified: Secondary | ICD-10-CM | POA: Diagnosis present

## 2016-08-30 DIAGNOSIS — I5022 Chronic systolic (congestive) heart failure: Secondary | ICD-10-CM | POA: Diagnosis present

## 2016-08-30 DIAGNOSIS — E1140 Type 2 diabetes mellitus with diabetic neuropathy, unspecified: Secondary | ICD-10-CM | POA: Diagnosis present

## 2016-08-30 DIAGNOSIS — I6930 Unspecified sequelae of cerebral infarction: Secondary | ICD-10-CM

## 2016-08-30 DIAGNOSIS — I480 Paroxysmal atrial fibrillation: Secondary | ICD-10-CM

## 2016-08-30 DIAGNOSIS — I481 Persistent atrial fibrillation: Secondary | ICD-10-CM

## 2016-08-30 DIAGNOSIS — M109 Gout, unspecified: Secondary | ICD-10-CM | POA: Diagnosis present

## 2016-08-30 DIAGNOSIS — I724 Aneurysm of artery of lower extremity: Secondary | ICD-10-CM | POA: Diagnosis present

## 2016-08-30 DIAGNOSIS — T82868A Thrombosis of vascular prosthetic devices, implants and grafts, initial encounter: Secondary | ICD-10-CM

## 2016-08-30 DIAGNOSIS — Y832 Surgical operation with anastomosis, bypass or graft as the cause of abnormal reaction of the patient, or of later complication, without mention of misadventure at the time of the procedure: Secondary | ICD-10-CM | POA: Diagnosis present

## 2016-08-30 DIAGNOSIS — I729 Aneurysm of unspecified site: Secondary | ICD-10-CM | POA: Diagnosis not present

## 2016-08-30 DIAGNOSIS — N200 Calculus of kidney: Secondary | ICD-10-CM | POA: Diagnosis present

## 2016-08-30 DIAGNOSIS — I70219 Atherosclerosis of native arteries of extremities with intermittent claudication, unspecified extremity: Secondary | ICD-10-CM | POA: Diagnosis present

## 2016-08-30 DIAGNOSIS — E1122 Type 2 diabetes mellitus with diabetic chronic kidney disease: Secondary | ICD-10-CM | POA: Diagnosis not present

## 2016-08-30 DIAGNOSIS — T81718A Complication of other artery following a procedure, not elsewhere classified, initial encounter: Secondary | ICD-10-CM | POA: Diagnosis present

## 2016-08-30 DIAGNOSIS — I2510 Atherosclerotic heart disease of native coronary artery without angina pectoris: Secondary | ICD-10-CM | POA: Diagnosis not present

## 2016-08-30 DIAGNOSIS — G609 Hereditary and idiopathic neuropathy, unspecified: Secondary | ICD-10-CM | POA: Diagnosis present

## 2016-08-30 DIAGNOSIS — H548 Legal blindness, as defined in USA: Secondary | ICD-10-CM | POA: Diagnosis present

## 2016-08-30 DIAGNOSIS — N132 Hydronephrosis with renal and ureteral calculous obstruction: Secondary | ICD-10-CM | POA: Diagnosis present

## 2016-08-30 DIAGNOSIS — I482 Chronic atrial fibrillation: Secondary | ICD-10-CM | POA: Diagnosis not present

## 2016-08-30 DIAGNOSIS — R34 Anuria and oliguria: Secondary | ICD-10-CM | POA: Diagnosis not present

## 2016-08-30 DIAGNOSIS — K5900 Constipation, unspecified: Secondary | ICD-10-CM | POA: Diagnosis present

## 2016-08-30 DIAGNOSIS — D62 Acute posthemorrhagic anemia: Secondary | ICD-10-CM | POA: Diagnosis not present

## 2016-08-30 DIAGNOSIS — K219 Gastro-esophageal reflux disease without esophagitis: Secondary | ICD-10-CM | POA: Diagnosis present

## 2016-08-30 HISTORY — PX: FALSE ANEURYSM REPAIR: SHX5152

## 2016-08-30 HISTORY — PX: APPLICATION OF WOUND VAC: SHX5189

## 2016-08-30 HISTORY — PX: THROMBECTOMY FEMORAL ARTERY: SHX6406

## 2016-08-30 HISTORY — PX: FEMORAL-POPLITEAL BYPASS GRAFT: SHX937

## 2016-08-30 HISTORY — PX: ENDARTERECTOMY FEMORAL: SHX5804

## 2016-08-30 HISTORY — PX: FEMORAL-FEMORAL BYPASS GRAFT: SHX936

## 2016-08-30 LAB — GLUCOSE, CAPILLARY
GLUCOSE-CAPILLARY: 191 mg/dL — AB (ref 65–99)
Glucose-Capillary: 223 mg/dL — ABNORMAL HIGH (ref 65–99)

## 2016-08-30 LAB — MRSA PCR SCREENING: MRSA BY PCR: NEGATIVE

## 2016-08-30 LAB — PROTIME-INR
INR: 1.01
PROTHROMBIN TIME: 13.3 s (ref 11.4–15.2)

## 2016-08-30 SURGERY — REPAIR, PSEUDOANEURYSM
Anesthesia: General | Site: Leg Upper | Laterality: Left

## 2016-08-30 MED ORDER — MIRTAZAPINE 15 MG PO TABS
7.5000 mg | ORAL_TABLET | Freq: Every day | ORAL | Status: DC
  Administered 2016-08-30 – 2016-09-01 (×3): 7.5 mg via ORAL
  Filled 2016-08-30 (×3): qty 1

## 2016-08-30 MED ORDER — PHENOL 1.4 % MT LIQD: 1.0000 | OROMUCOSAL | Status: DC | PRN

## 2016-08-30 MED ORDER — PROTAMINE SULFATE 10 MG/ML IV SOLN
INTRAVENOUS | Status: DC | PRN
  Administered 2016-08-30 (×2): 10 mg via INTRAVENOUS
  Administered 2016-08-30: 20 mg via INTRAVENOUS
  Administered 2016-08-30 (×6): 10 mg via INTRAVENOUS

## 2016-08-30 MED ORDER — MAGNESIUM SULFATE 2 GM/50ML IV SOLN
2.0000 g | Freq: Every day | INTRAVENOUS | Status: DC | PRN
  Filled 2016-08-30: qty 50

## 2016-08-30 MED ORDER — VANCOMYCIN HCL IN DEXTROSE 1-5 GM/200ML-% IV SOLN
INTRAVENOUS | Status: AC
  Filled 2016-08-30: qty 200

## 2016-08-30 MED ORDER — ONDANSETRON HCL 4 MG/2ML IJ SOLN: 4.0000 mg | Freq: Four times a day (QID) | INTRAMUSCULAR | Status: DC | PRN

## 2016-08-30 MED ORDER — GUAIFENESIN-DM 100-10 MG/5ML PO SYRP: 15.0000 mL | ORAL_SOLUTION | ORAL | Status: DC | PRN

## 2016-08-30 MED ORDER — THROMBIN 20000 UNITS EX SOLR
CUTANEOUS | Status: AC
  Filled 2016-08-30: qty 20000

## 2016-08-30 MED ORDER — MORPHINE SULFATE (PF) 4 MG/ML IV SOLN
2.0000 mg | INTRAVENOUS | Status: DC | PRN
  Administered 2016-08-31: 2 mg via INTRAVENOUS
  Filled 2016-08-30: qty 1

## 2016-08-30 MED ORDER — FENTANYL CITRATE (PF) 100 MCG/2ML IJ SOLN: 25.0000 ug | INTRAMUSCULAR | Status: DC | PRN

## 2016-08-30 MED ORDER — ASPIRIN 81 MG PO CHEW
81.0000 mg | CHEWABLE_TABLET | Freq: Every day | ORAL | Status: DC
  Administered 2016-08-31 – 2016-09-02 (×3): 81 mg via ORAL
  Filled 2016-08-30 (×4): qty 1

## 2016-08-30 MED ORDER — LABETALOL HCL 5 MG/ML IV SOLN
20.0000 mg | Freq: Four times a day (QID) | INTRAVENOUS | Status: DC | PRN
  Administered 2016-08-30: 20 mg via INTRAVENOUS
  Filled 2016-08-30: qty 4

## 2016-08-30 MED ORDER — ALLOPURINOL 100 MG PO TABS
100.0000 mg | ORAL_TABLET | Freq: Every day | ORAL | Status: DC
  Administered 2016-08-31 – 2016-09-02 (×3): 100 mg via ORAL
  Filled 2016-08-30 (×3): qty 1

## 2016-08-30 MED ORDER — SUCCINYLCHOLINE CHLORIDE 200 MG/10ML IV SOSY
PREFILLED_SYRINGE | INTRAVENOUS | Status: DC | PRN
  Administered 2016-08-30: 100 mg via INTRAVENOUS

## 2016-08-30 MED ORDER — 0.9 % SODIUM CHLORIDE (POUR BTL) OPTIME
TOPICAL | Status: DC | PRN
  Administered 2016-08-30: 1000 mL

## 2016-08-30 MED ORDER — ACETAMINOPHEN 650 MG RE SUPP: 650.0000 mg | Freq: Four times a day (QID) | RECTAL | Status: DC | PRN

## 2016-08-30 MED ORDER — ONDANSETRON HCL 4 MG/2ML IJ SOLN
INTRAMUSCULAR | Status: DC | PRN
Start: 2016-08-30 — End: 2016-08-30
  Administered 2016-08-30: 4 mg via INTRAVENOUS

## 2016-08-30 MED ORDER — FENTANYL CITRATE (PF) 100 MCG/2ML IJ SOLN
100.0000 ug | Freq: Once | INTRAMUSCULAR | Status: AC
  Administered 2016-08-30: 100 ug via INTRAVENOUS
  Filled 2016-08-30: qty 2

## 2016-08-30 MED ORDER — ACETAMINOPHEN 325 MG PO TABS: 650.0000 mg | ORAL_TABLET | Freq: Four times a day (QID) | ORAL | Status: DC | PRN

## 2016-08-30 MED ORDER — DONEPEZIL HCL 5 MG PO TABS
10.0000 mg | ORAL_TABLET | Freq: Every day | ORAL | Status: DC
  Administered 2016-08-30 – 2016-09-01 (×3): 10 mg via ORAL
  Filled 2016-08-30: qty 2
  Filled 2016-08-30: qty 1
  Filled 2016-08-30: qty 2
  Filled 2016-08-30: qty 1

## 2016-08-30 MED ORDER — GLYCOPYRROLATE 0.2 MG/ML IJ SOLN
INTRAMUSCULAR | Status: DC | PRN
  Administered 2016-08-30: 0.2 mg via INTRAVENOUS

## 2016-08-30 MED ORDER — LEVETIRACETAM 250 MG PO TABS
250.0000 mg | ORAL_TABLET | Freq: Two times a day (BID) | ORAL | Status: DC
  Administered 2016-08-30 – 2016-09-02 (×6): 250 mg via ORAL
  Filled 2016-08-30 (×6): qty 1

## 2016-08-30 MED ORDER — PHENYLEPHRINE HCL 10 MG/ML IJ SOLN
INTRAVENOUS | Status: DC | PRN
  Administered 2016-08-30: 27 ug/min via INTRAVENOUS

## 2016-08-30 MED ORDER — SUFENTANIL CITRATE 50 MCG/ML IV SOLN
INTRAVENOUS | Status: DC | PRN
Start: 2016-08-30 — End: 2016-08-30
  Administered 2016-08-30 (×2): 5 ug via INTRAVENOUS
  Administered 2016-08-30: 10 ug via INTRAVENOUS
  Administered 2016-08-30: 5 ug via INTRAVENOUS

## 2016-08-30 MED ORDER — MIDAZOLAM HCL 2 MG/2ML IJ SOLN
INTRAMUSCULAR | Status: AC
  Filled 2016-08-30: qty 2

## 2016-08-30 MED ORDER — INSULIN ASPART 100 UNIT/ML ~~LOC~~ SOLN
0.0000 [IU] | Freq: Three times a day (TID) | SUBCUTANEOUS | Status: DC
  Administered 2016-08-31: 1 [IU] via SUBCUTANEOUS
  Administered 2016-08-31: 2 [IU] via SUBCUTANEOUS
  Administered 2016-09-02 (×2): 1 [IU] via SUBCUTANEOUS

## 2016-08-30 MED ORDER — HEPARIN SODIUM (PORCINE) 1000 UNIT/ML IJ SOLN
INTRAMUSCULAR | Status: DC | PRN
  Administered 2016-08-30: 6000 [IU] via INTRAVENOUS
  Administered 2016-08-30: 2000 [IU] via INTRAVENOUS
  Administered 2016-08-30: 1000 [IU] via INTRAVENOUS

## 2016-08-30 MED ORDER — HYDRALAZINE HCL 25 MG PO TABS: 25.0000 mg | ORAL_TABLET | Freq: Four times a day (QID) | ORAL | Status: DC | PRN

## 2016-08-30 MED ORDER — AMLODIPINE BESYLATE 5 MG PO TABS
5.0000 mg | ORAL_TABLET | Freq: Every day | ORAL | Status: DC
  Administered 2016-08-30 – 2016-09-02 (×4): 5 mg via ORAL
  Filled 2016-08-30 (×4): qty 1

## 2016-08-30 MED ORDER — SODIUM CHLORIDE 0.9 % IV SOLN: 500.0000 mL | Freq: Once | INTRAVENOUS | Status: DC | PRN

## 2016-08-30 MED ORDER — MORPHINE SULFATE (PF) 2 MG/ML IV SOLN: 1.0000 mg | INTRAVENOUS | Status: DC | PRN

## 2016-08-30 MED ORDER — DEXTROSE 5 % IV SOLN
1.0000 g | INTRAVENOUS | Status: DC
  Administered 2016-08-30 – 2016-09-01 (×3): 1 g via INTRAVENOUS
  Filled 2016-08-30 (×4): qty 1

## 2016-08-30 MED ORDER — SODIUM CHLORIDE 0.9 % IV SOLN
INTRAVENOUS | Status: DC | PRN
  Administered 2016-08-30: 08:00:00 via INTRAVENOUS

## 2016-08-30 MED ORDER — ALBUMIN HUMAN 5 % IV SOLN
INTRAVENOUS | Status: DC | PRN
  Administered 2016-08-30 (×2): via INTRAVENOUS

## 2016-08-30 MED ORDER — SUFENTANIL CITRATE 50 MCG/ML IV SOLN
INTRAVENOUS | Status: AC
  Filled 2016-08-30: qty 1

## 2016-08-30 MED ORDER — METOPROLOL TARTRATE 5 MG/5ML IV SOLN
2.0000 mg | INTRAVENOUS | Status: AC | PRN
  Administered 2016-08-30: 5 mg via INTRAVENOUS
  Administered 2016-08-30: 2 mg via INTRAVENOUS
  Filled 2016-08-30 (×2): qty 5

## 2016-08-30 MED ORDER — OXYCODONE HCL 5 MG/5ML PO SOLN: 5.0000 mg | Freq: Once | ORAL | Status: DC | PRN

## 2016-08-30 MED ORDER — METOPROLOL TARTRATE 50 MG PO TABS
50.0000 mg | ORAL_TABLET | Freq: Every day | ORAL | Status: DC
  Administered 2016-08-30 – 2016-09-02 (×4): 50 mg via ORAL
  Filled 2016-08-30 (×4): qty 1

## 2016-08-30 MED ORDER — DOCUSATE SODIUM 100 MG PO CAPS
100.0000 mg | ORAL_CAPSULE | Freq: Every day | ORAL | Status: DC
  Administered 2016-08-31 – 2016-09-02 (×3): 100 mg via ORAL
  Filled 2016-08-30 (×3): qty 1

## 2016-08-30 MED ORDER — ALUM & MAG HYDROXIDE-SIMETH 200-200-20 MG/5ML PO SUSP
15.0000 mL | ORAL | Status: DC | PRN
  Administered 2016-09-02: 30 mL via ORAL
  Filled 2016-08-30: qty 30

## 2016-08-30 MED ORDER — SODIUM CHLORIDE 0.9 % IV SOLN
INTRAVENOUS | Status: DC | PRN
  Administered 2016-08-30: 500 mL

## 2016-08-30 MED ORDER — ASPIRIN 81 MG PO CHEW: 81.0000 mg | CHEWABLE_TABLET | Freq: Every day | ORAL | Status: DC

## 2016-08-30 MED ORDER — OXYCODONE HCL 5 MG PO TABS
5.0000 mg | ORAL_TABLET | ORAL | Status: DC | PRN
  Administered 2016-08-31 – 2016-09-01 (×3): 5 mg via ORAL
  Filled 2016-08-30 (×3): qty 1

## 2016-08-30 MED ORDER — METOPROLOL TARTRATE 25 MG PO TABS
25.0000 mg | ORAL_TABLET | Freq: Every day | ORAL | Status: DC
  Administered 2016-08-30 – 2016-09-01 (×3): 25 mg via ORAL
  Filled 2016-08-30 (×3): qty 1

## 2016-08-30 MED ORDER — LABETALOL HCL 5 MG/ML IV SOLN
10.0000 mg | INTRAVENOUS | Status: DC | PRN
  Administered 2016-08-30: 10 mg via INTRAVENOUS
  Filled 2016-08-30: qty 4

## 2016-08-30 MED ORDER — PANTOPRAZOLE SODIUM 40 MG PO TBEC
40.0000 mg | DELAYED_RELEASE_TABLET | Freq: Every day | ORAL | Status: DC
  Administered 2016-08-30 – 2016-09-02 (×4): 40 mg via ORAL
  Filled 2016-08-30 (×4): qty 1

## 2016-08-30 MED ORDER — OXYCODONE HCL 5 MG PO TABS: 5.0000 mg | ORAL_TABLET | Freq: Once | ORAL | Status: DC | PRN

## 2016-08-30 MED ORDER — LIDOCAINE 2% (20 MG/ML) 5 ML SYRINGE
INTRAMUSCULAR | Status: DC | PRN
  Administered 2016-08-30: 100 mg via INTRAVENOUS

## 2016-08-30 MED ORDER — PROPOFOL 10 MG/ML IV BOLUS
INTRAVENOUS | Status: DC | PRN
  Administered 2016-08-30: 150 mg via INTRAVENOUS
  Administered 2016-08-30: 20 mg via INTRAVENOUS

## 2016-08-30 MED ORDER — SERTRALINE HCL 50 MG PO TABS
50.0000 mg | ORAL_TABLET | Freq: Every day | ORAL | Status: DC
  Administered 2016-08-30 – 2016-09-02 (×4): 50 mg via ORAL
  Filled 2016-08-30 (×4): qty 1

## 2016-08-30 MED ORDER — VANCOMYCIN HCL IN DEXTROSE 750-5 MG/150ML-% IV SOLN
750.0000 mg | INTRAVENOUS | Status: DC
  Administered 2016-08-30: 750 mg via INTRAVENOUS
  Filled 2016-08-30 (×2): qty 150

## 2016-08-30 MED ORDER — HEMOSTATIC AGENTS (NO CHARGE) OPTIME
TOPICAL | Status: DC | PRN
  Administered 2016-08-30 (×4): 1 via TOPICAL

## 2016-08-30 MED ORDER — SODIUM CHLORIDE 0.9 % IV SOLN
INTRAVENOUS | Status: DC
  Administered 2016-08-30 – 2016-08-31 (×2): via INTRAVENOUS

## 2016-08-30 MED ORDER — LACTATED RINGERS IV SOLN
INTRAVENOUS | Status: DC | PRN
  Administered 2016-08-30 (×2): via INTRAVENOUS

## 2016-08-30 MED ORDER — ATORVASTATIN CALCIUM 10 MG PO TABS
10.0000 mg | ORAL_TABLET | Freq: Every day | ORAL | Status: DC
  Administered 2016-08-30 – 2016-09-01 (×3): 10 mg via ORAL
  Filled 2016-08-30 (×3): qty 1

## 2016-08-30 MED ORDER — PROPOFOL 10 MG/ML IV BOLUS
INTRAVENOUS | Status: AC
  Filled 2016-08-30: qty 20

## 2016-08-30 MED ORDER — PRO-STAT SUGAR FREE PO LIQD
30.0000 mL | Freq: Two times a day (BID) | ORAL | Status: DC
  Administered 2016-08-31 – 2016-09-01 (×4): 30 mL via ORAL
  Filled 2016-08-30 (×5): qty 30

## 2016-08-30 MED ORDER — VANCOMYCIN HCL 1000 MG IV SOLR
INTRAVENOUS | Status: DC | PRN
  Administered 2016-08-30: 1000 mg via INTRAVENOUS

## 2016-08-30 SURGICAL SUPPLY — 59 items
CANISTER SUCT 3000ML PPV (MISCELLANEOUS) ×4 IMPLANT
CANISTER WOUND CARE 500ML ATS (WOUND CARE) ×4 IMPLANT
CATH EMB 3FR 80CM (CATHETERS) ×4 IMPLANT
CATH EMB 4FR 80CM (CATHETERS) ×4 IMPLANT
CATH EMB 5FR 80CM (CATHETERS) ×4 IMPLANT
CLIP VESOCCLUDE MED 24/CT (CLIP) ×4 IMPLANT
CLIP VESOCCLUDE SM WIDE 24/CT (CLIP) ×4 IMPLANT
DERMABOND ADVANCED (GAUZE/BANDAGES/DRESSINGS) ×2
DERMABOND ADVANCED .7 DNX12 (GAUZE/BANDAGES/DRESSINGS) ×2 IMPLANT
DRAIN CHANNEL 15F RND FF W/TCR (WOUND CARE) ×4 IMPLANT
DRSG VAC ATS MED SENSATRAC (GAUZE/BANDAGES/DRESSINGS) ×4 IMPLANT
ELECT REM PT RETURN 9FT ADLT (ELECTROSURGICAL) ×4
ELECTRODE REM PT RTRN 9FT ADLT (ELECTROSURGICAL) ×2 IMPLANT
EVACUATOR SILICONE 100CC (DRAIN) ×4 IMPLANT
GAUZE SPONGE 4X4 12PLY STRL LF (GAUZE/BANDAGES/DRESSINGS) ×4 IMPLANT
GLOVE BIO SURGEON STRL SZ 6.5 (GLOVE) ×6 IMPLANT
GLOVE BIO SURGEONS STRL SZ 6.5 (GLOVE) ×2
GLOVE BIOGEL PI IND STRL 6.5 (GLOVE) ×12 IMPLANT
GLOVE BIOGEL PI IND STRL 7.5 (GLOVE) ×2 IMPLANT
GLOVE BIOGEL PI INDICATOR 6.5 (GLOVE) ×12
GLOVE BIOGEL PI INDICATOR 7.5 (GLOVE) ×2
GLOVE ECLIPSE 6.5 STRL STRAW (GLOVE) ×8 IMPLANT
GLOVE SURG SS PI 6.5 STRL IVOR (GLOVE) ×8 IMPLANT
GLOVE SURG SS PI 7.5 STRL IVOR (GLOVE) ×4 IMPLANT
GOWN STRL REUS W/ TWL LRG LVL3 (GOWN DISPOSABLE) ×8 IMPLANT
GOWN STRL REUS W/ TWL XL LVL3 (GOWN DISPOSABLE) ×4 IMPLANT
GOWN STRL REUS W/TWL LRG LVL3 (GOWN DISPOSABLE) ×8
GOWN STRL REUS W/TWL XL LVL3 (GOWN DISPOSABLE) ×4
GRAFT HEMASHIELD 8MM (Vascular Products) ×2 IMPLANT
GRAFT PROPATEN THIN WALL 6X40 (Vascular Products) ×4 IMPLANT
GRAFT VASC STRG 30X8KNIT (Vascular Products) ×2 IMPLANT
HEMOSTAT SNOW SURGICEL 2X4 (HEMOSTASIS) ×4 IMPLANT
KIT BASIN OR (CUSTOM PROCEDURE TRAY) ×4 IMPLANT
KIT ROOM TURNOVER OR (KITS) ×4 IMPLANT
NS IRRIG 1000ML POUR BTL (IV SOLUTION) ×8 IMPLANT
PACK PERIPHERAL VASCULAR (CUSTOM PROCEDURE TRAY) ×4 IMPLANT
PAD ARMBOARD 7.5X6 YLW CONV (MISCELLANEOUS) ×8 IMPLANT
POWDER SURGICEL 3.0 GRAM (HEMOSTASIS) ×4 IMPLANT
PUNCH AORTIC ROTATE 5MM 8IN (MISCELLANEOUS) ×4 IMPLANT
SEALANT SURG COSEAL 8ML (VASCULAR PRODUCTS) ×4 IMPLANT
SPONGE LAP 18X18 X RAY DECT (DISPOSABLE) ×16 IMPLANT
STAPLER VISISTAT 35W (STAPLE) ×4 IMPLANT
SUT ETHILON 3 0 PS 1 (SUTURE) ×4 IMPLANT
SUT PROLENE 2 0 SH DA (SUTURE) ×4 IMPLANT
SUT PROLENE 3 0 SH DA (SUTURE) ×8 IMPLANT
SUT PROLENE 5 0 C 1 24 (SUTURE) ×12 IMPLANT
SUT PROLENE 6 0 BV (SUTURE) ×12 IMPLANT
SUT SILK 2 0 SH (SUTURE) ×4 IMPLANT
SUT VIC AB 2-0 CT1 27 (SUTURE) ×4
SUT VIC AB 2-0 CT1 TAPERPNT 27 (SUTURE) ×4 IMPLANT
SUT VIC AB 3-0 SH 27 (SUTURE) ×4
SUT VIC AB 3-0 SH 27X BRD (SUTURE) ×4 IMPLANT
SUT VICRYL 4-0 PS2 18IN ABS (SUTURE) ×4 IMPLANT
SYR 3ML LL SCALE MARK (SYRINGE) ×4 IMPLANT
SYRINGE 3CC LL L/F (MISCELLANEOUS) ×4 IMPLANT
TAPE CLOTH SURG 4X10 WHT LF (GAUZE/BANDAGES/DRESSINGS) ×4 IMPLANT
TRAY FOLEY W/METER SILVER 16FR (SET/KITS/TRAYS/PACK) ×4 IMPLANT
WATER STERILE IRR 1000ML POUR (IV SOLUTION) ×4 IMPLANT
YANKAUER SUCT BULB TIP NO VENT (SUCTIONS) ×4 IMPLANT

## 2016-08-30 NOTE — Anesthesia Preprocedure Evaluation (Addendum)
Anesthesia Evaluation  Patient identified by MRN, date of birth, ID band Patient awake    Reviewed: Allergy & Precautions, H&P , NPO status , Patient's Chart, lab work & pertinent test results  Airway Mallampati: II   Neck ROM: full    Dental   Pulmonary former smoker,    breath sounds clear to auscultation       Cardiovascular hypertension, + Peripheral Vascular Disease and +CHF  + dysrhythmias Atrial Fibrillation  Rhythm:irregular Rate:Normal  EF 35-40%   Neuro/Psych Seizures -,  PSYCHIATRIC DISORDERS Depression dementiaCVA, Residual Symptoms    GI/Hepatic GERD  ,  Endo/Other  diabetes, Type 2  Renal/GU Renal InsufficiencyRenal disease     Musculoskeletal   Abdominal   Peds  Hematology   Anesthesia Other Findings   Reproductive/Obstetrics                            Anesthesia Physical Anesthesia Plan  ASA: III  Anesthesia Plan: General   Post-op Pain Management:    Induction: Intravenous  PONV Risk Score and Plan: 3 and Ondansetron, Dexamethasone, Midazolam, Propofol infusion and Treatment may vary due to age or medical condition  Airway Management Planned: Oral ETT  Additional Equipment:   Intra-op Plan:   Post-operative Plan: Extubation in OR  Informed Consent: I have reviewed the patients History and Physical, chart, labs and discussed the procedure including the risks, benefits and alternatives for the proposed anesthesia with the patient or authorized representative who has indicated his/her understanding and acceptance.     Plan Discussed with: CRNA, Anesthesiologist and Surgeon  Anesthesia Plan Comments:         Anesthesia Quick Evaluation

## 2016-08-30 NOTE — ED Notes (Signed)
Report to care Link-on way to transport patient

## 2016-08-30 NOTE — Care Management Note (Signed)
Case Management Note  Patient Details  Name: Casey [name] MRN: [id number] Date of Birth: 1940/10/05  Subjective/Objective:  From [facility], presents with large left femoral pseudoaneurysm in groin.  For repair of left femoral pseudoaneurysm , revision of right to left fem bypass graft, left endarterectomy. CSW referral.                    Action/Plan: NCM will follow along with CSW for dc needs.   Expected Discharge Date:                  Expected Discharge Plan:  Skilled Nursing Facility  In-House Referral:  Clinical Social Work  Discharge planning Services  CM Consult  Post Acute Care Choice:    Choice offered to:     DME Arranged:    DME Agency:     HH Arranged:    [place] Agency:     Status of Service:  Completed, signed off  If discussed at [employer], dates discussed:    Additional Comments:  Zenon Mayo, RN 08/30/2016, 1:29 PM

## 2016-08-30 NOTE — Consult Note (Signed)
[facility] [place]  Patient name: Casey [name] MRN: [id number] DOB: 09-25-1940 Sex: female   REQUESTING PROVIDER:    ER   REASON FOR CONSULT:    Left femoral pseudoaneurysm  HISTORY OF PRESENT ILLNESS:   Casey [name] is a 76 y.o. female, who Presented to the emergency department from her skilled nursing facility after they found a large painful mass in her left groin.  A CT scan demonstrated a large pseudoaneurysm.  The patient has a history of a left femoral-popliteal bypass graft with vein done by Dr. Amedeo Plenty.  She is also status post right to left femoral-femoral bypass graft by Dr. Kellie Simmering.  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 Scot Dock.  The patient has a history of atrial fibrillation.  She has been on anticoagulation in the past, but is not currently.  She is pleasantly demented and legally blind.  She is a former smoker.  She has a history of stroke.  She has coronary artery disease and congestive heart failure as well as chronic renal insufficiency she is medically managed for hypertension.  She is on a statin for hypercholesterolemia.  PAST MEDICAL HISTORY    Past Medical History:  Diagnosis Date  . Abdominal or pelvic swelling, mass, or lump, left upper quadrant   . CHF (congestive heart failure) ([place])   . Chronic atrial fibrillation ([place])   . Chronic kidney disease   . Congestive heart failure, unspecified   . Coronary atherosclerosis of unspecified type of vessel, native or graft   . Diabetes mellitus   . Edema   . GERD (gastroesophageal reflux disease)   . Gout, unspecified   . Hypercholesterolemia   . Hypertension   . 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  . Peripheral vascular disease, unspecified ([place])   . Seizures  ([place])   . Sinus of Valsalva aneurysm    a. By 2D echo 05/2011.  . Stroke ([place])   . Unspecified hereditary and idiopathic peripheral neuropathy   . Unspecified urinary incontinence   . Vascular dementia, uncomplicated      FAMILY HISTORY   Family History  Problem Relation Age of Onset  . Cancer Mother        Cervical  . Stroke Mother   . Diabetes Mother   . Stroke Father   . Heart disease Father     SOCIAL HISTORY:   Social History   Social History  . Marital status: Widowed    Spouse name: N/A  . Number of children: N/A  . Years of education: N/A   Occupational History  . Not on file.   Social History Main Topics  . Smoking status: Former Smoker    Types: Cigarettes    Quit date: 01/29/2001  . Smokeless tobacco: Never Used  . Alcohol use No  . Drug use: No  . Sexual activity: No   Other Topics Concern  . Not on file   Social History Narrative  . No narrative on file    ALLERGIES:    Allergies  Allergen Reactions  . Penicillins Other (See Comments)    Patient states that she was told previously by a doctor to not take this medication but is unsure why.     CURRENT MEDICATIONS:    Current Facility-Administered Medications  Medication Dose Route Frequency  Provider Last Rate Last Dose  . acetaminophen (TYLENOL) tablet 650 mg  650 mg Oral Q6H PRN Danford, Suann Larry, MD       Or  . acetaminophen (TYLENOL) suppository 650 mg  650 mg Rectal Q6H PRN Danford, Suann Larry, MD      . allopurinol (ZYLOPRIM) tablet 100 mg  100 mg Oral Daily Danford, Suann Larry, MD      . amLODipine (NORVASC) tablet 5 mg  5 mg Oral Daily Danford, Suann Larry, MD      . aspirin chewable tablet 81 mg  81 mg Oral Daily Danford, Suann Larry, MD      . atorvastatin (LIPITOR) tablet 10 mg  10 mg Oral QHS Danford, Suann Larry, MD      . donepezil (ARICEPT) tablet 10 mg  10 mg Oral QHS Danford, Suann Larry, MD      . feeding supplement (PRO-STAT SUGAR FREE 64) liquid 30  mL  30 mL Oral BID Danford, Suann Larry, MD      . hydrALAZINE (APRESOLINE) tablet 25 mg  25 mg Oral Q6H PRN Danford, Suann Larry, MD      . iopamidol (ISOVUE-300) 61 % injection           . levETIRAcetam (KEPPRA) tablet 250 mg  250 mg Oral BID Danford, Suann Larry, MD      . metoprolol tartrate (LOPRESSOR) tablet 25 mg  25 mg Oral QHS Danford, Suann Larry, MD      . metoprolol tartrate (LOPRESSOR) tablet 50 mg  50 mg Oral Daily Danford, Suann Larry, MD      . mirtazapine (REMERON) tablet 7.5 mg  7.5 mg Oral QHS Danford, Suann Larry, MD      . oxyCODONE (Oxy IR/ROXICODONE) immediate release tablet 5 mg  5 mg Oral Q4H PRN Danford, Suann Larry, MD      . sertraline (ZOLOFT) tablet 50 mg  50 mg Oral Daily Danford, Suann Larry, MD        REVIEW OF SYSTEMS:   [X]  denotes positive finding, [ ]  denotes negative finding Cardiac  Comments:  Chest pain or chest pressure:    Shortness of breath upon exertion:    Short of breath when lying flat:    Irregular heart rhythm:        Vascular    Pain in calf, thigh, or hip brought on by ambulation:    Pain in feet at night that wakes you up from your sleep:     Blood clot in your veins:    Leg swelling:         Pulmonary    Oxygen at home:    Productive cough:     Wheezing:         Neurologic    Sudden weakness in arms or legs:     Sudden numbness in arms or legs:     Sudden onset of difficulty speaking or slurred speech:    Temporary loss of vision in one eye:     Problems with dizziness:         Gastrointestinal    Blood in stool:      Vomited blood:         Genitourinary    Burning when urinating:     Blood in urine:        Psychiatric    Major depression:         Hematologic    Bleeding problems:    Problems with blood clotting too easily:  Skin    Rashes or ulcers:        Constitutional    Fever or chills:     PHYSICAL EXAM:   Vitals:   08/30/16 0300 08/30/16 0330 08/30/16 0400 08/30/16 0500    BP: (!) 149/97 (!) 153/91 (!) 156/89 (!) 166/76  Pulse: 82 74 74 82  Resp:   18 (!) 24  Temp:   98.7 F (37.1 C)   TempSrc:   Oral   SpO2: 97% 95% 97% 98%  Weight:   126 lb 8.7 oz (57.4 kg)   Height:   5\' 5"  (1.651 m)     GENERAL: The patient is a well-nourished female, in no acute distress. The vital signs are documented above. CARDIAC: There is a regular rate and rhythm.  VASCULAR: Large very tender pulsatile mass in the left groin.  I cannot palpate pedal pulses. PULMONARY: Nonlabored respirations ABDOMEN: Soft and non-tender with normal pitched bowel sounds.  MUSCULOSKELETAL: There are no major deformities or cyanosis. NEUROLOGIC: No focal weakness or paresthesias are detected. SKIN: There are no ulcers or rashes noted. PSYCHIATRIC: Pleasantly confused  STUDIES:   I have reviewed her CT scan with the following findings: 1. 4.4 x 5.7 x 7.5 cm pseudoaneurysm arising from the left aspect of a fem-fem bypass graft in the left inguinal region. Finding suggestive of graft failure. Superimposed infection not excluded. 2. Extensive atherosclerotic calcifications throughout the intra-abdominal aorta and its branch vessels. 3. 6 mm calculus at the right UVJ with secondary mild right hydroureteronephrosis. Additional nonobstructive right renal nephrolithiasis as above. 4. Large volume stool within the rectal vault, suggesting constipation.  ASSESSMENT and PLAN   Recurrent left femoral pseudoaneurysm: The patient has presented with a large very tender left femoral pseudoaneurysm.  I discussed with the patient and with the daughter, Fraser Din via telephone, that the only option for repair is to take her to the operating room.  I did discuss the risks and benefits including the risk of limb loss with them.  Everyone wishes to proceed.  This will be done emergently.   Annamarie Major, MD [facility] [phone] Pager [phone]

## 2016-08-30 NOTE — Progress Notes (Signed)
Pharmacy Antibiotic Note  Casey [name] is a 76 y.o. female admitted on 08/29/2016 with L femoral aneurysm s/p urgent repair by vascular surgery.  Pharmacy has been consulted for vancomycin and cefepime dosing for post-op prophylaxis until tissue culture returns. CrCl ~61ml/min, WBC wnl, afebrile.  Plan: -Cefepime 1g IV q24h -Vancomycin 750mg  IV q24h -Monitor LOT, cultures, renal funx -Obtain vancomycin level as indicated  Height: 5\' 5"  (165.1 cm) Weight: 126 lb 8.7 oz (57.4 kg) IBW/kg (Calculated) : 57  Temp (24hrs), Avg:98 F (36.7 C), Min:97 F (36.1 C), Max:98.7 F (37.1 C)   Recent Labs Lab 08/29/16 1930  WBC 8.3  CREATININE 1.20*    Estimated Creatinine Clearance: 35.9 mL/min (A) (by C-G formula based on SCr of 1.2 mg/dL (H)).    Allergies  Allergen Reactions  . Penicillins Other (See Comments)    Patient states that she was told previously by a doctor to not take this medication but is unsure why.     Antimicrobials this admission: 8/2 Vancomycin >> 8/2 Cefepime >>  Dose adjustments this admission: n/a  Microbiology results: 8/1 UCx: px 8/2 MRSA: neg 8/2 Wound Cx: px  Thank you for allowing pharmacy to be a part of this patient's care.  Arrie Senate, PharmD PGY-2 Cardiology Pharmacy Resident Pager: [phone] 08/30/2016

## 2016-08-30 NOTE — Anesthesia Postprocedure Evaluation (Signed)
Anesthesia Post Note  Patient: Casey [name]  Procedure(s) Performed: Procedure(s) (LRB): REPAIR PSEUDOANEURYSM LEFT GROIN (Left) REDO RIGHT TO LEFT FEMORAL-FEMORAL ARTERY BYPASS GRAFT (Left) REVISION OF LEFT FEMORAL-POPLITEAL ARTERY BYPASS GRAFT (Left) THROMBECTOMY OF FEMORAL-FEMORAL ARTERY BYPASS GRAFT AND LEFT FEMORAL TO LEFT POPLITEAL BYPASS GRAFT (Left) LEFT COMMON FEMORAL ARTERY ENDARTERECTOMY (Left) APPLICATION OF WOUND VAC LEFT GROIN (Left)     Patient location during evaluation: PACU Anesthesia Type: General Level of consciousness: awake and alert, patient cooperative and oriented Pain management: pain level controlled Vital Signs Assessment: post-procedure vital signs reviewed and stable Respiratory status: spontaneous breathing, nonlabored ventilation, respiratory function stable and patient connected to nasal cannula oxygen Cardiovascular status: blood pressure returned to baseline and stable Postop Assessment: no signs of nausea or vomiting Anesthetic complications: no    Last Vitals:  Vitals:   08/30/16 1419 08/30/16 1430  BP:  121/80  Pulse:  89  Resp: 20 (!) 27  Temp:      Last Pain:  Vitals:   08/30/16 0400  TempSrc: Oral                 Bernardette Waldron,E. Jurell Basista

## 2016-08-30 NOTE — H&P (Signed)
History and Physical  Patient Name: Casey [name]     PPJ:[id number]    DOB: 1940-06-17    DOA: 08/29/2016 PCP: Gildardo Cranker, DO  Patient coming from: [facility]  Chief Complaint: Groin bulge      HPI: Casey [name] is a 76 y.o. female with a past medical history significant for dementia, pAF not on anticoag, CVA with R hemiparesis, CHF EF 35%, HTN, PVD s/p fem-fem bypass, seizures and legally blind who presents with left groin bulge.  Caveat that patient is demented and does not provide meaningful history (states she lives with her daughter, can only say that she came here in an ambulance, doesn't know why).  Per report, the patient has been in her normal state of health (WC bound, pleasant and interactive, but moderate dementia and hemiparesis).  Tonight, staff at her facility noticed a painful mass in her left groin/lower abdomen and sent her to the ER.  She had had no other focal complaints, no fever, no urinary symptoms, and no prior complaints of pain or groin discomfort.  ED course: -Afebrile, heart rate 98, respirations 25, blood pressure 179/103, pulse ox normal -Na 140, K 4.2, Cr 1.2 (baseline 1.2), WBC 8.3K, Hgb 13 -Urinalysis showed bacteria, no WBCs -CT of the abdomen and pelvis with contrast showed a mildly obstructed 6 mm stone at the right UVJ, also a 7.5 cm left femoral pseudoaneurysm -This was discussed with Dr. Trula Slade of vascular surgery who recommended transfer to [facility] for surgical evaluation and management     ROS: Review of Systems  Unable to perform ROS: Dementia  Although she denies pain anywhere except when her aneurysm is palpated.      Past Medical History:  Diagnosis Date  . Abdominal or pelvic swelling, mass, or lump, left upper quadrant   . CHF (congestive heart failure) ([place])   . Chronic atrial fibrillation ([place])   . Chronic kidney disease   . Congestive heart failure, unspecified   . Coronary atherosclerosis of unspecified type of  vessel, native or graft   . Diabetes mellitus   . Edema   . GERD (gastroesophageal reflux disease)   . Gout, unspecified   . Hypercholesterolemia   . Hypertension   . 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  . Peripheral vascular disease, unspecified ([place])   . Seizures ([place])   . Sinus of Valsalva aneurysm    a. By 2D echo 05/2011.  . Stroke ([place])   . Unspecified hereditary and idiopathic peripheral neuropathy   . Unspecified urinary incontinence   . Vascular dementia, uncomplicated     Past Surgical History:  Procedure Laterality Date  . AORTOGRAM  09/01/2008   Abd w/ bilateral lower extremity runoff arteriography  . FALSE ANEURYSM REPAIR  12/06/2011   Procedure: REPAIR FALSE ANEURYSM;  Surgeon: Angelia Mould, MD;  Location: [facility] OR;  Service: Vascular;  Laterality: Left;  Repair of left femoral Artery pseudoaneurysm  . FEMORAL ARTERY EXPLORATION  12/06/2011   Procedure: FEMORAL ARTERY EXPLORATION;  Surgeon: Angelia Mould, MD;  Location: [facility] OR;  Service: Vascular;  Laterality: N/A;  Exploration of large pseudoaneurysm left side of fem-fem bypass graft   . femoral to femoral bypass graft     . FEMORAL-FEMORAL BYPASS GRAFT  12/06/2011   Procedure: BYPASS GRAFT FEMORAL-FEMORAL ARTERY;  Surgeon: Angelia Mould, MD;  Location: [facility] OR;  Service: Vascular;  Laterality: Bilateral;  Revision of left to right Femoral-Femoral bypass graft  . FEMORAL-POPLITEAL BYPASS GRAFT Left 05/2002   lower extremity femoral to below knee w/ non-versed greater saphenous vein  . FEMORAL-POPLITEAL BYPASS GRAFT Left 11/2002  . TUBAL LIGATION Bilateral     Social History: Patient lives at [facility].  The patient mostly bed bound per geriatrician notes, not clear that she can walk.  Not currently smoker.  Worked at [employer].  Allergies    Allergen Reactions  . Penicillins Other (See Comments)    Patient states that she was told previously by a doctor to not take this medication but is unsure why.     Family history: family history includes Cancer in her mother; Diabetes in her mother; Heart disease in her father; Stroke in her father and mother.  Prior to Admission medications   Medication Sig Start Date End Date Taking? Authorizing Provider  allopurinol (ZYLOPRIM) 100 MG tablet Take 100 mg by mouth daily.   Yes [provider]  Amino Acids-Protein Hydrolys (FEEDING SUPPLEMENT, PRO-STAT SUGAR FREE 64,) LIQD Take 30 mLs by mouth 2 (two) times daily.    Yes [provider]  amLODipine (NORVASC) 2.5 MG tablet Take 2.5 mg by mouth daily.   Yes [provider]  aspirin 81 MG tablet Take 81 mg by mouth daily.   Yes [provider]  atorvastatin (LIPITOR) 10 MG tablet Take 10 mg by mouth at bedtime.    Yes [provider]  donepezil (ARICEPT) 10 MG tablet Take 1 tablet (10 mg total) by mouth at bedtime. 02/07/13  Yes Isaac Bliss, Rayford Halsted, MD  enalapril (VASOTEC) 5 MG tablet Take 5 mg by mouth daily.    Yes [provider]  levETIRAcetam (KEPPRA) 250 MG tablet Take 250 mg by mouth 2 (two) times daily.   Yes [provider]  metoprolol tartrate (LOPRESSOR) 25 MG tablet Take 50 mg by mouth every morning. And 25mg  at night. Hold if pulse  < 60   Yes [provider]  mirtazapine (REMERON) 7.5 MG tablet Take 7.5 mg by mouth at bedtime.   Yes [provider]  Nutritional Supplements (NUTRITIONAL SUPPLEMENT PO) Give by mouth House Shakes 4 oz 3 times daily   Yes [provider]  sertraline (ZOLOFT) 50 MG tablet Take 50 mg by mouth daily.    Yes [provider]  UNABLE TO FIND Med Name: Med Pass 2 give 120 ml three times daily by mouth. HSG Mech Soft texture   Yes [provider]       Physical Exam: BP (!) 168/112   Pulse  (!) 56   Temp 98.4 F (36.9 C) (Oral)   Resp 18   Ht 5\' 5"  (1.651 m)   Wt 59 kg (130 lb)   SpO2 96%   BMI 21.63 kg/m  General appearance: Frail elderly adult female, alert and in no acute distress.   Eyes: Anicteric, conjunctiva pink, lids and lashes normal. Pupils equal.  Appears to be blind, reacts to light.    ENT: No nasal deformity, discharge, epistaxis.  Hearing normal. OP moist without lesions.  Dentures. Neck: No neck masses.  Trachea midline.  No thyromegaly/tenderness. Lymph: No cervical or supraclavicular lymphadenopathy. Skin: Warm and dry.  No suspicious rashes or lesions. Cardiac: RRR, nl S1-S2, no murmurs appreciated.  Capillary refill is brisk.  JVP normal.  No LE edema.  Radial and DP pulses 2+ and  symmetric.  Feet warm, toes mobile on both feet. Respiratory: Tachypneic.  CTAB without rales or wheezes. Abdomen: Abdomen soft.  Marked TTP over left groin hard mass. No ascites, distension, hepatosplenomegaly.   MSK: No deformities or effusions.  No cyanosis or clubbing.  Diffuse loss of muscle mass. Neuro: Face symmetric.  EOMI intact.  Voice normal, palate elevation normal. Speech is fluent.  Muscle strength symmetric, globally somewhat diminished.    Psych: Sensorium intact and responding to questions, attention normal.  Behavior appropriate.  Affect pleasant.  Judgment and insight appear impaired.  Oriented to hospital, not which one.  Not oriented to year or situation.     Labs on Admission:  I have personally reviewed following labs and imaging studies: CBC:  Recent Labs Lab 08/29/16 1930  WBC 8.3  NEUTROABS 5.6  HGB 13.0  HCT 38.7  MCV 79.5  PLT 301   Basic Metabolic Panel:  Recent Labs Lab 08/29/16 1930  NA 140  K 4.2  CL 105  CO2 26  GLUCOSE 132*  BUN 28*  CREATININE 1.20*  CALCIUM 9.8   GFR: Estimated Creatinine Clearance: 35.9 mL/min (A) (by C-G formula based on SCr of 1.2 mg/dL (H)).  Liver Function Tests:  Recent Labs Lab  08/29/16 1930  AST 18  ALT 15  ALKPHOS 90  BILITOT 0.7  PROT 7.4  ALBUMIN 3.6   No results for input(s): LIPASE, AMYLASE in the last 168 hours. No results for input(s): AMMONIA in the last 168 hours. Coagulation Profile:  Recent Labs Lab 08/30/16 0003  INR 1.01        Radiological Exams on Admission: Personally reviewed CT report: Ct Abdomen Pelvis W Contrast  Result Date: 08/29/2016 CLINICAL DATA:  Initial evaluation for acute abdominal pain, left rolling mass. EXAM: CT ABDOMEN AND PELVIS WITH CONTRAST TECHNIQUE: Multidetector CT imaging of the abdomen and pelvis was performed using the standard protocol following bolus administration of intravenous contrast. CONTRAST:  175mL ISOVUE-300 IOPAMIDOL (ISOVUE-300) INJECTION 61% COMPARISON:  Prior CT from 12/04/2011. FINDINGS: Lower chest: Scattered atelectatic changes noted within the visualized lung bases. Cardiomegaly partially visualized. No pleural or pericardial effusion. Hepatobiliary: Liver demonstrates a normal contrast enhanced appearance. Gallbladder within normal limits. No biliary dilatation. Pancreas: Pancreas somewhat atrophic but otherwise unremarkable without acute inflammation or mass lesion. Spleen: Subcentimeter hypodensity noted within central aspect of the spleen, of doubtful significance. Spleen otherwise unremarkable. Adrenals/Urinary Tract: Adrenal glands within normal limits. Kidneys relatively equal in size with symmetric enhancement. Scattered cortical thinning and scarring. 5.6 cm cyst noted extending from the lower pole left kidney. Additional scattered subcentimeter hypodensities within the bilateral kidneys too small the characterize, but statistically likely reflects small cysts as well. Multiple nonobstructive calculi present within the right kidney, largest of which positioned within the lower pole and measures approximately 15 mm. Mild right hydroureteronephrosis. There is an obstructive 6 mm calculus at the  right UVJ (series 2, image 64). No left-sided renal calculi. No left-sided hydronephrosis or hydroureter. Bladder largely decompressed. Mild circumferential bladder wall thickening like related incomplete distension. Stomach/Bowel: Stomach within normal limits. No evidence for bowel obstruction. Appendix within normal limits. No acute inflammatory changes seen about the bowels. Large volume stool seen packed within the rectal vault, suggesting constipation. Vascular/Lymphatic: Extensive atherosclerosis throughout the intra-abdominal aorta and its branch vessels. Origin of the celiac axis, SMA, renal arteries, and IMA are grossly patent. Occlusion of the left common iliac just distal to its origin (series 2, image 42). Left internal iliac occluded proximally,  with distal reconstitution likely via collateralization. Occluded vascular stent in place within the left external iliac artery. Extensive atherosclerotic disease throughout the right iliac system. Fem-fem bypass graft in place. That right common femoral anastomosis appears patent. Large pseudoaneurysm measuring 4.4 x 5.7 x 7.5 cm arises from the left common femoral anastomosis in the left inguinal region (series 2, image 63). Opacification of the left common femoral artery distally. Reproductive: Scattered calcified uterine fibroids noted. Uterus and ovaries otherwise within normal limits. Other: No free air or fluid. Musculoskeletal: Small intramuscular lipoma noted within the left abdominus musculature. No acute osseous abnormality. No worrisome lytic or blastic osseous lesions. IMPRESSION: 1. 4.4 x 5.7 x 7.5 cm pseudoaneurysm arising from the left aspect of a fem-fem bypass graft in the left inguinal region. Finding suggestive of graft failure. Superimposed infection not excluded. 2. Extensive atherosclerotic calcifications throughout the intra-abdominal aorta and its branch vessels. 3. 6 mm calculus at the right UVJ with secondary mild right  hydroureteronephrosis. Additional nonobstructive right renal nephrolithiasis as above. 4. Large volume stool within the rectal vault, suggesting constipation. Critical Value/emergent results were called by telephone at the time of interpretation on 08/29/2016 at 11:29 pm to Dr. Gareth Morgan , who verbally acknowledged these results. Electronically Signed   By: Jeannine Boga M.D.   On: 08/29/2016 23:34    Echocardiogram 2015: Report reviewed EF 35% Mild MR PAP 52    Assessment/Plan  1. Pseudoaneurysm of left femoral artery:    Her overall CV risk perioperatively is intermediate to high.  She has no active cardiac symptoms, no new murmurs and further cardiac testing is unlikely to modify surgical plans. -BP control with hydralazine tonight -Consult to Vascular surgery -Continue aspirin and Lipitor   2. Nephrolith with mild obstruction:  Asymptomatic.  This is an incidental finding. -Monitor for fever -Follow urine culture  3. Chronic systolic CHF:  EF 65%.  Currently appears euvolemic. -I/Os, daily weights  4. Dementia:  -Continue donepezil, sertraline, mirtazapine  5. Paroxysmal atrial fibrillation:  CHADS2Vasc 8, not on AC unclear why.   -Continue metoprolol  6. Hypertension:  Hypertensive at admission -Increase metoprolol and amlodipine doses -IV hydralazine for severe high BP -Restart enalaril after surgery  7. Seizures:  -Continue Keppra  8. Other medications: -Continue allopurinol  9. Legally blind:  10. CKD stage III: At baseline, creatinine 1-1.2.         DVT prophylaxis: IPCs for now  Code Status: FULL per SNF facesheet  Family Communication: None present  Disposition Plan: Anticipate surgery evaluation and possible surgery tonight. Consults called: Vascular surgery, Dr. Trula Slade Admission status: INPATIENT   Medical decision making: Patient seen at 1:05 AM on 08/30/2016.  The patient was discussed with Dr. Billy Fischer.  What exists of  the patient's chart was reviewed in depth and summarized above.  Clinical condition: stable.        Edwin Dada [facility] Pager [phone]        At the time of admission, it appears that the appropriate admission status for this patient is INPATIENT. This is judged to be reasonable and necessary in order to provide the required intensity of service to ensure the patient's safety given the presenting symptoms, physical exam findings, and initial radiographic and laboratory data in the context of their chronic comorbidities.  Together, these circumstances are felt to place her at high risk for further clinical deterioration threatening life, limb, or organ.   Patient requires inpatient status due to high intensity of service,  high risk for further deterioration and high frequency of surveillance required because of this acute illness that poses a threat to life, limb or bodily function.  I certify that at the point of admission it is my clinical judgment that the patient will require inpatient hospital care spanning beyond 2 midnights from the point of admission and that early discharge would result in unnecessary risk of decompensation and readmission or threat to life, limb or bodily function.

## 2016-08-30 NOTE — Anesthesia Procedure Notes (Signed)
Procedure Name: Intubation Date/Time: 08/30/2016 5:51 AM Performed by: Claris Che Pre-anesthesia Checklist: Patient identified, Emergency Drugs available, Suction available, Patient being monitored and Timeout performed Patient Re-evaluated:Patient Re-evaluated prior to induction Oxygen Delivery Method: Circle system utilized Preoxygenation: Pre-oxygenation with 100% oxygen Induction Type: IV induction, Rapid sequence and Cricoid Pressure applied Laryngoscope Size: Mac and 3 Grade View: Grade II Tube size: 8.0 mm Number of attempts: 1 Airway Equipment and Method: Stylet Placement Confirmation: ETT inserted through vocal cords under direct vision,  positive ETCO2 and breath sounds checked- equal and bilateral Secured at: 24 cm Tube secured with: Tape Dental Injury: Teeth and Oropharynx as per pre-operative assessment

## 2016-08-30 NOTE — Op Note (Signed)
Patient name: Casey [name] MRN: [id number] DOB: 08/05/1940 Sex: female  08/29/2016 - 08/30/2016 Pre-operative Diagnosis: left femoral pseudoaneurysm Post-operative diagnosis:  Same Surgeon:  Annamarie Major Assistants:  Joselyn Arrow Procedure:  #1: Repair of left femoral pseudoaneurysm   #2: Redo left femoral artery exposure   #3: Revision of right to left femoral-femoral bypass graft with interposition 8 mm dacryon graft   #4: Revision of left femoral-popliteal vein bypass graft with interposition Gore-Tex graft   #5: Ligation of left common femoral artery   #6: Left femoral endarterectomy   #7: Thrombectomy of left femoral popliteal bypass graft   #8: Thrombectomy of right to left femoral-femoral bypass graft   #9:  Application of wound VAC     Anesthesia:  Gen. Blood Loss:  See anesthesia record Specimens:  Graft sent for culture  Findings:  Large pseudoaneurysm in the left groin secondary to complete dehiscence of the left femoral-femoral anastomosis.  The common femoral artery was occluded.  I did a endarterectomy and was able to get backbleeding from the profunda femoral artery.  I reconstructed the left groin by taking a new 8 mm dacryon graft off of the old graft and plugging this and an 8 and 2 and fashion to the distal common femoral artery.  Because there was not enough length to bring the femoral-popliteal bypass graft to this level, I had to take a 6 mm Gore-Tex graft and bring this off of the dacryon graft and perform a end to end anastomosis to the vein left femoral-popliteal bypass graft   Indications:  The patient presented to the emergency department with left groin pain of acute onset.  CT scan showed a large left femoral pseudoaneurysm which was extremely tender.  Due to concerns of rupture, she was taken urgently to the operating room.    Procedure:  The patient was identified in the holding area and taken to [place] [facility]  The patient was then placed supine on the  table. general anesthesia was administered.  The patient was prepped and draped in the usual sterile fashion.  A time out was called and antibiotics were administered.  I initially began by making an incision over top of the femoral-femoral graft close to the right groin.  The graft to this level was dissected free and encircled with a vessel loop for proximal control.  Next the patient's previous longitudinal incision was opened with a #10 blade.  There was dense scar tissue in the left groin.  Oozing sharp dissection I began distally and identified the femoral-popliteal vein graft.  I then proceeded with sharp dissection to expose nearly the entire pseudoaneurysm.  I was able to get circumferential control of the proximal common femoral artery.  In order to get this high in the groin, I ended up dividing part of the inguinal ligament.  Because of the all of the scar tissue which was very dense it was difficult to find any tissue plane.  At one point the peritoneum was opened and I was able to visualize small bowel.  I did close the peritoneum with Prolene suture.  Once I finally had the pseudoaneurysm well exposed as well as control the common femoral artery, I then got control of the profunda and superficial femoral artery which in previous operative notes were described as being occluded.  At this point, the patient was fully heparinized.  I occluded the vein graft as well as the femoral-femoral graft.  I opened up the pseudoaneurysm and  evacuated a lot of clot.  The wall to the pseudoaneurysm was very thin.  I resected the entire pseudoaneurysm.  Once the clot was removed until the etiology was apparent.  The left limb of the femoral-femoral graft was no longer attached to the artery but free-floating within the pseudoaneurysm.  After the area was debrided, I looked to identify the anastomosis to the left common femoral artery, however I could not visualize any opening in the artery.  I then turned my attention  towards the femoral-popliteal bypass graft.  This has acute thrombus in it.  I passed multiple catheters until I evacuated all of the clot and was able to get good backbleeding.  The vein graft was flushed with heparin saline and reoccluded.  There was also acute thrombosis of the femoral-femoral graft.  I passed multiple Fogarty catheters across the femoral-femoral graft and was ultimately able to evacuate all the clot and reestablish excellent flow through the femoral-femoral graft.  At this point, with no obvious opening in the common femoral artery, I elected to open the common femoral artery.  I could not find a lumen and presumed it was occluded.  I therefore transected it and since there was no antegrade or retrograde bleeding my intention was to perform a end to end anastomosis from the femoral-femoral graft to the vein graft.  I did transect the old femoral-femoral graft within the incision I used to get proximal control.  I selected a new 8 mm dacryon graft and performed a end to end anastomosis with running 5-0 Prolene.  At this point, I again want to make sure the profunda was occluded.  There appeared to be some plaque in the artery and therefore I ended up performing a endarterectomy by pulling the plaque out.  I was able to evacuate a large plaque and with doing this, I was able to reestablish backbleeding from the profunda femoral artery.  At this point my operative plan changed.  Instead of going to the vein graft, I took the 8 mm dacryon graft and performed a end to end anastomosis to the distal common femoral artery.  Once this was completed, bloodflow was reestablish dialysis able to get a good Doppler signal in the profunda femoral artery.  Because the vein graft was not long enough to reach the femoral-femoral graft, I had to place a interposition 6 mm dacryon graft.  This was brought off of the medial side of the femoral-femoral graft approximately 2 cm above the femoral anastomosis.  This  anastomosis was sewn with 6-0 Prolene after using a 5 mm punch to create the graftotomy.  The Gore-Tex was cut to the appropriate length and a end to end anastomosis was performed to the femoral-popliteal vein graft with running 6-0 Prolene.  After the appropriate flushing maneuvers were performed, bloodflow was reestablish to the left leg.  The patient did have a brisk posterior tibial Doppler signal.  I reversed the patient's heparin was 100 mg of protamine.  She had diffuse oozing from all the raw surface areas.  We spent approximately 1 hour trying to get hemostasis.  I used multiple topical agents.  Eventually, I felt it was best to reapproximate the tissue to see if this helps control the diffuse oozing.  I also placed a 15 Blake drain which was brought out through a separate stab incision.  I closed the deep tissue with 2-0 Vicryl, the subcutaneous tissue with 3-0 Vicryl and the skin was stapled close.  I did place  an incisional wound VAC.   Disposition:  To PACU stable   V. Annamarie Major, M.D. [facility] Office: [phone] Pager:  [phone]

## 2016-08-30 NOTE — ED Notes (Signed)
Contacted pt Emergency contact Dtr Braulio Bosch, regarding pt being sent over to [facility] for surgery. Pt daughter consented to transport,

## 2016-08-30 NOTE — Progress Notes (Signed)
Patient seen and examined  76 y.o. female with a past medical history significant for dementia, pAF not on anticoag, CVA with R hemiparesis, CHF EF 35%, HTN, PVD s/p fem-fem bypass, seizures and legally blind who presents with left groin bulge. Diagnosed with  left femoral pseudoaneurysm, status post urgent repair by Dr.Brabham

## 2016-08-30 NOTE — Transfer of Care (Signed)
Immediate Anesthesia Transfer of Care Note  Patient: Casey [name]  Procedure(s) Performed: Procedure(s): REPAIR PSEUDOANEURYSM LEFT GROIN (Left) REDO RIGHT TO LEFT FEMORAL-FEMORAL ARTERY BYPASS GRAFT (Left) REVISION OF LEFT FEMORAL-POPLITEAL ARTERY BYPASS GRAFT (Left) THROMBECTOMY OF FEMORAL-FEMORAL ARTERY BYPASS GRAFT AND LEFT FEMORAL TO LEFT POPLITEAL BYPASS GRAFT (Left) LEFT COMMON FEMORAL ARTERY ENDARTERECTOMY (Left) APPLICATION OF WOUND VAC LEFT GROIN (Left)  Patient Location: PACU  Anesthesia Type:General  Level of Consciousness: awake  Airway & Oxygen Therapy: Patient Spontanous Breathing and Patient connected to nasal cannula oxygen  Post-op Assessment: Report given to RN, Post -op Vital signs reviewed and stable and Patient moving all extremities  Post vital signs: Reviewed and stable  Last Vitals:  Vitals:   08/30/16 0500 08/30/16 1153  BP: (!) 166/76 (P) 118/67  Pulse: 82 (!) (P) 125  Resp: (!) 24 (!) (P) 23  Temp:  (P) 36.6 C    Last Pain:  Vitals:   08/30/16 0400  TempSrc: Oral         Complications: No apparent anesthesia complications

## 2016-08-31 ENCOUNTER — Inpatient Hospital Stay (HOSPITAL_COMMUNITY): Payer: Medicare Other

## 2016-08-31 ENCOUNTER — Encounter (HOSPITAL_COMMUNITY): Payer: Self-pay | Admitting: Surgery

## 2016-08-31 DIAGNOSIS — I729 Aneurysm of unspecified site: Secondary | ICD-10-CM

## 2016-08-31 LAB — GLUCOSE, CAPILLARY
GLUCOSE-CAPILLARY: 132 mg/dL — AB (ref 65–99)
GLUCOSE-CAPILLARY: 232 mg/dL — AB (ref 65–99)
Glucose-Capillary: 200 mg/dL — ABNORMAL HIGH (ref 65–99)
Glucose-Capillary: 61 mg/dL — ABNORMAL LOW (ref 65–99)
Glucose-Capillary: 90 mg/dL (ref 65–99)

## 2016-08-31 LAB — CBC
HCT: 22.3 % — ABNORMAL LOW (ref 36.0–46.0)
HEMOGLOBIN: 7.3 g/dL — AB (ref 12.0–15.0)
MCH: 26.3 pg (ref 26.0–34.0)
MCHC: 32.7 g/dL (ref 30.0–36.0)
MCV: 80.2 fL (ref 78.0–100.0)
Platelets: 137 10*3/uL — ABNORMAL LOW (ref 150–400)
RBC: 2.78 MIL/uL — AB (ref 3.87–5.11)
RDW: 16.2 % — ABNORMAL HIGH (ref 11.5–15.5)
WBC: 12.2 10*3/uL — ABNORMAL HIGH (ref 4.0–10.5)

## 2016-08-31 LAB — HEMOGLOBIN AND HEMATOCRIT, BLOOD
HCT: 29.4 % — ABNORMAL LOW (ref 36.0–46.0)
Hemoglobin: 9.6 g/dL — ABNORMAL LOW (ref 12.0–15.0)

## 2016-08-31 LAB — BASIC METABOLIC PANEL
Anion gap: 9 (ref 5–15)
BUN: 27 mg/dL — AB (ref 6–20)
CHLORIDE: 108 mmol/L (ref 101–111)
CO2: 21 mmol/L — ABNORMAL LOW (ref 22–32)
CREATININE: 1.59 mg/dL — AB (ref 0.44–1.00)
Calcium: 8.6 mg/dL — ABNORMAL LOW (ref 8.9–10.3)
GFR, EST AFRICAN AMERICAN: 35 mL/min — AB (ref >60)
GFR, EST NON AFRICAN AMERICAN: 30 mL/min — AB (ref >60)
Glucose, Bld: 192 mg/dL — ABNORMAL HIGH (ref 65–99)
Potassium: 4.6 mmol/L (ref 3.5–5.1)
SODIUM: 138 mmol/L (ref 135–145)

## 2016-08-31 LAB — URINE CULTURE

## 2016-08-31 LAB — PREPARE RBC (CROSSMATCH)

## 2016-08-31 LAB — HEMOGLOBIN A1C
Hgb A1c MFr Bld: 6.6 % — ABNORMAL HIGH (ref 4.8–5.6)
Mean Plasma Glucose: 143 mg/dL

## 2016-08-31 MED ORDER — ORAL CARE MOUTH RINSE
15.0000 mL | Freq: Two times a day (BID) | OROMUCOSAL | Status: DC
  Administered 2016-08-31 – 2016-09-01 (×3): 15 mL via OROMUCOSAL

## 2016-08-31 MED ORDER — SODIUM CHLORIDE 0.9 % IV SOLN
Freq: Once | INTRAVENOUS | Status: AC
  Administered 2016-08-31: 09:00:00 via INTRAVENOUS

## 2016-08-31 MED ORDER — CHLORHEXIDINE GLUCONATE 0.12 % MT SOLN
15.0000 mL | Freq: Two times a day (BID) | OROMUCOSAL | Status: DC
  Administered 2016-08-31 – 2016-09-02 (×5): 15 mL via OROMUCOSAL
  Filled 2016-08-31 (×5): qty 15

## 2016-08-31 MED ORDER — SODIUM CHLORIDE 0.9 % IV SOLN
INTRAVENOUS | Status: AC
  Administered 2016-08-31: 09:00:00 via INTRAVENOUS

## 2016-08-31 MED ORDER — SODIUM CHLORIDE 0.9 % IV SOLN
500.0000 mg | INTRAVENOUS | Status: DC
  Administered 2016-08-31 – 2016-09-01 (×2): 500 mg via INTRAVENOUS
  Filled 2016-08-31 (×3): qty 500

## 2016-08-31 MED ORDER — DILTIAZEM HCL 25 MG/5ML IV SOLN
10.0000 mg | Freq: Four times a day (QID) | INTRAVENOUS | Status: DC | PRN
  Filled 2016-08-31: qty 5

## 2016-08-31 MED ORDER — SODIUM CHLORIDE 0.9 % IV SOLN: Freq: Once | INTRAVENOUS | Status: AC

## 2016-08-31 NOTE — Progress Notes (Signed)
Vascular Surgery paged about low urine output of less than 20 for four consecutive hours per protocol/ order.  Also made MD aware of hemoglobin of 7.3.  MD said to discuss with rounding doctors in the morning.

## 2016-08-31 NOTE — Evaluation (Signed)
Physical Therapy Evaluation Patient Details Name: Casey [name] MRN: [id number] DOB: April 18, 1940 Today's Date: 08/31/2016   History of Present Illness  76 yo admitted from SNF with left groin pseudoaneurysm s/p fem-pop revision, right to left fem-fem BPG, left fem endarterectomy. PMHx: blind, dementia, CVA with right hemiparesis, CHF, HTN, Sz, PaF, gout  Clinical Impression  Pt confused but pleasant disoriented to place, situation and time. Pt reports generalized pain all over with movement, consolable at rest. Pt with limited bil LE movement with resistance to movement of LLE for ROM. Pt with generalized weakness, decreased mobility and balance with baseline WC level and no caregivers present to state PLOF or assist. Pt will benefit from trial of P.T. Acutely to maximize ROM, function and balance to decrease burden of care. Recommend maximove for OOB with nursing. Pt limited by tachycardia EOB with rate as high as 160 with only 3 min tolerated EOB but limited by cardiopulmonary function with pt returned to supine.     Follow Up Recommendations SNF    Equipment Recommendations  None recommended by PT    Recommendations for Other Services       Precautions / Restrictions Precautions Precautions: Fall Precaution Comments: blind, hemiparesis, Jp drain, VAc left groin, watch HR      Mobility  Bed Mobility Overal bed mobility: Needs Assistance Bed Mobility: Supine to Sit;Sit to Supine     Supine to sit: Total assist;+2 for safety/equipment;+2 for physical assistance Sit to supine: Total assist;+2 for physical assistance;+2 for safety/equipment   General bed mobility comments: total assist to pivot to EOB and shift trunk anteriorly with assist to return trunk and legs to supine. Pt maintains posterior lean EOB with bil LE blocked and assist for trunk position. Pt with tachycardia HR up to 160 EOB and returned to supine with HR 110 supine  Transfers                 General  transfer comment: unable to attempt at this time  Ambulation/Gait                Stairs            Wheelchair Mobility    Modified Rankin (Stroke Patients Only)       Balance Overall balance assessment: Needs assistance   Sitting balance-Leahy Scale: Zero                                       Pertinent Vitals/Pain Pain Assessment:  (PAINAD= 6)    Home Living Family/patient expects to be discharged to:: Skilled nursing facility                      Prior Function Level of Independence: Needs assistance   Gait / Transfers Assistance Needed: pt unable to provide but per RN report WC bound from SNF           Hand Dominance        Extremity/Trunk Assessment   Upper Extremity Assessment Upper Extremity Assessment: Difficult to assess due to impaired cognition    Lower Extremity Assessment Lower Extremity Assessment: Difficult to assess due to impaired cognition;RLE deficits/detail;LLE deficits/detail RLE Deficits / Details: grossly 2-/5 LLE Deficits / Details: 2/5 grossly unable to assess full ROM due to cognition even with AAROM    Cervical / Trunk Assessment Cervical / Trunk Assessment: Kyphotic  Communication   Communication:  No difficulties  Cognition Arousal/Alertness: Awake/alert Behavior During Therapy: WFL for tasks assessed/performed Overall Cognitive Status: No family/caregiver present to determine baseline cognitive functioning                                 General Comments: pt with baseline dementia, oriented to self and able to calm      General Comments      Exercises General Exercises - Lower Extremity Heel Slides: PROM;Right;10 reps;Supine Hip ABduction/ADduction: PROM;10 reps;Both;Supine   Assessment/Plan    PT Assessment Patient needs continued PT services  PT Problem List Decreased strength;Decreased mobility;Decreased range of motion;Decreased activity tolerance;Decreased  cognition;Cardiopulmonary status limiting activity;Decreased balance;Pain       PT Treatment Interventions Therapeutic activities;Therapeutic exercise;Patient/family education;Cognitive remediation;Balance training;Functional mobility training    PT Goals (Current goals can be found in the Care Plan section)  Acute Rehab PT Goals PT Goal Formulation: Patient unable to participate in goal setting Time For Goal Achievement: 09/14/16 Potential to Achieve Goals: Poor    Frequency Min 2X/week   Barriers to discharge        Co-evaluation               AM-PAC PT "6 Clicks" Daily Activity  Outcome Measure Difficulty turning over in bed (including adjusting bedclothes, sheets and blankets)?: Total Difficulty moving from lying on back to sitting on the side of the bed? : Total Difficulty sitting down on and standing up from a chair with arms (e.g., wheelchair, bedside commode, etc,.)?: Total Help needed moving to and from a bed to chair (including a wheelchair)?: Total Help needed walking in hospital room?: Total Help needed climbing 3-5 steps with a railing? : Total 6 Click Score: 6    End of Session   Activity Tolerance: Treatment limited secondary to medical complications (Comment) Patient left: in bed;with bed alarm set;with call bell/phone within reach;with nursing/sitter in room Nurse Communication: Mobility status;Precautions;Need for lift equipment PT Visit Diagnosis: Muscle weakness (generalized) (M62.81);Other abnormalities of gait and mobility (R26.89)    Time: 4388-8757 PT Time Calculation (min) (ACUTE ONLY): 27 min   Charges:   PT Evaluation $PT Eval Moderate Complexity: 1 Mod     PT G Codes:        Elwyn Reach, PT [phone]   [place] B Chirstopher Iovino 08/31/2016, 8:48 AM

## 2016-08-31 NOTE — Clinical Social Work Note (Signed)
Clinical Social Work Assessment  Patient Details  Name: Casey [name] MRN: [id number] Date of Birth: 1940/07/19  Date of referral:  08/31/16               Reason for consult:  Facility Placement                Permission sought to share information with:  Chartered certified accountant granted to share information::  No (pt has dementia and no HCPOA on file-spoke with next of kin)  Name::     Lupita Leash  Agency::  [facility]  Relationship::  dtr  Contact Information:     Housing/Transportation Living arrangements for the past 2 months:  [place] of Information:  Adult Children Patient Interpreter Needed:  None Criminal Activity/Legal Involvement Pertinent to Current Situation/Hospitalization:  No - Comment as needed Significant Relationships:  Adult Children Lives with:  Facility Resident Do you feel safe going back to the place where you live?  No Need for family participation in patient care:  Yes (Comment) (decision making)  Care giving concerns: pt is LTC resident at [facility]- no concerns expressed with care at this time   Social Worker assessment / plan:  CSW spoke with pt dtr concerning discharge planning.  Confirmed with dtr that pt is resident at [facility] and has been there for 2 years.  Employment status:  Retired Forensic scientist:  Medicare PT Recommendations:  Not assessed at this time [place] / Referral to community resources:  [place]  Patient/Family's Response to care:  Pt dtr is agreeable to pt return to SNF when stable.  Patient/Family's Understanding of and Emotional Response to Diagnosis, Current Treatment, and Prognosis:  No questions or concerns at this time- dtr hopeful pt will have smooth recovery and be able to return to SNF soon.  Emotional Assessment Appearance:  Appears stated age Attitude/Demeanor/Rapport:  Unable to Assess Affect (typically observed):  Unable to  Assess Orientation:  Oriented to Self Alcohol / Substance use:  Not Applicable Psych involvement (Current and /or in the community):  No (Comment)  Discharge Needs  Concerns to be addressed:  Care Coordination Readmission within the last 30 days:  No Current discharge risk:  Physical Impairment Barriers to Discharge:  Continued Medical Work up   Jorge Ny, LCSW 08/31/2016, 9:49 AM

## 2016-08-31 NOTE — Progress Notes (Signed)
VASCULAR LAB PRELIMINARY  ARTERIAL  ABI completed: Unable to obtain right ABI due to non-detectable waveforms. Left ABI of 0.43 is suggestive of severe arterial occlusive disease at rest. Unable to obtain bilateral TBI's due to low amplitude waveforms.   RIGHT    LEFT    PRESSURE WAVEFORM  PRESSURE WAVEFORM  BRACHIAL 161 Triphasic BRACHIAL IV   AT   AT 69 Monophasic  PT   PT 51 Monophasic    RIGHT LEFT  ABI  0.43   Results were given to the patient's nurse, Sarah.   Legrand Como, RVT 08/31/2016, 10:50 AM

## 2016-08-31 NOTE — Evaluation (Signed)
Clinical/Bedside Swallow Evaluation Patient Details  Name: Casey [name] MRN: [id number] Date of Birth: 03/18/1940  Today's Date: 08/31/2016 Time: SLP Start Time (ACUTE ONLY): 1147 SLP Stop Time (ACUTE ONLY): 1208 SLP Time Calculation (min) (ACUTE ONLY): 21 min  Past Medical History:  Past Medical History:  Diagnosis Date  . Abdominal or pelvic swelling, mass, or lump, left upper quadrant   . CHF (congestive heart failure) ([place])   . Chronic atrial fibrillation ([place])   . Chronic kidney disease   . Congestive heart failure, unspecified   . Coronary atherosclerosis of unspecified type of vessel, native or graft   . Diabetes mellitus   . Edema   . GERD (gastroesophageal reflux disease)   . Gout, unspecified   . Hypercholesterolemia   . Hypertension   . 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  . Peripheral vascular disease, unspecified ([place])   . Seizures ([place])   . Sinus of Valsalva aneurysm    a. By 2D echo 05/2011.  . Stroke ([place])   . Unspecified hereditary and idiopathic peripheral neuropathy   . Unspecified urinary incontinence   . Vascular dementia, uncomplicated    Past Surgical History:  Past Surgical History:  Procedure Laterality Date  . AORTOGRAM  09/01/2008   Abd w/ bilateral lower extremity runoff arteriography  . FALSE ANEURYSM REPAIR  12/06/2011   Procedure: REPAIR FALSE ANEURYSM;  Surgeon: Angelia Mould, MD;  Location: [facility] OR;  Service: Vascular;  Laterality: Left;  Repair of left femoral Artery pseudoaneurysm  . FEMORAL ARTERY EXPLORATION  12/06/2011   Procedure: FEMORAL ARTERY EXPLORATION;  Surgeon: Angelia Mould, MD;  Location: [facility] OR;  Service: Vascular;  Laterality: N/A;  Exploration of large pseudoaneurysm left side of fem-fem bypass graft   . femoral to femoral bypass graft     . FEMORAL-FEMORAL  BYPASS GRAFT  12/06/2011   Procedure: BYPASS GRAFT FEMORAL-FEMORAL ARTERY;  Surgeon: Angelia Mould, MD;  Location: [facility] OR;  Service: Vascular;  Laterality: Bilateral;  Revision of left to right Femoral-Femoral bypass graft  . FEMORAL-POPLITEAL BYPASS GRAFT Left 05/2002   lower extremity femoral to below knee w/ non-versed greater saphenous vein  . FEMORAL-POPLITEAL BYPASS GRAFT Left 11/2002  . TUBAL LIGATION Bilateral    HPI:  Casey [name] a 76 y.o.femalewith a past medical history significant for dementia, pAF not on anticoag, CVA with R hemiparesis, CHF EF 35%, HTN, PVD s/p fem-fem bypass, seizures and legally blindwho presents with left groin bulge. s/p: repair left femoral pseudoaneurysm, thrombectomy left fem-pop bypass graft and fem-fem bypass graft   Assessment / Plan / Recommendation Clinical Impression  Pt demonstrates signs of normal oral and ororpharygneal function, however after minimal trials pt experienced excessive wet belching, sometimes appearing to have liquid come up to her throat she needed to swallow. RN confirms she saw this when giving her some orange juice this am. Discussed with her daughter Casey [name] who reprots she does belch often, but maybe not as much as what was observed today. Pt may be at increased risk of reflux/vomiting today, perhaps related to recent surgery. Recommend pt consume a clear liquid diet for now with esophageal precations. Will f/u for potential diet advancement tomorrow if pts presentation improves. Daughter in agreement with plan.  SLP Visit Diagnosis: Dysphagia, pharyngoesophageal phase (R13.14)    Aspiration Risk  Moderate aspiration risk  Diet Recommendation Thin liquid   Liquid Administration via: Cup;Straw Medication Administration: Whole meds with liquid Supervision: Staff to assist with self feeding Compensations: Slow rate;Small sips/bites Postural Changes: Seated upright at 90 degrees;Remain upright for at least 30  minutes after po intake    Other  Recommendations Oral Care Recommendations: Oral care BID   Follow up Recommendations Skilled Nursing facility      Frequency and Duration min 2x/week  2 weeks       Prognosis Prognosis for Safe Diet Advancement: Good      Swallow Study   General HPI: Casey [name] a 76 y.o.femalewith a past medical history significant for dementia, pAF not on anticoag, CVA with R hemiparesis, CHF EF 35%, HTN, PVD s/p fem-fem bypass, seizures and legally blindwho presents with left groin bulge. s/p: repair left femoral pseudoaneurysm, thrombectomy left fem-pop bypass graft and fem-fem bypass graft Type of Study: Bedside Swallow Evaluation Previous Swallow Assessment: none Diet Prior to this Study: Dysphagia 3 (soft);Thin liquids Temperature Spikes Noted: No Respiratory Status: Room air History of Recent Intubation: Yes Length of Intubations (days):  (for surgery) Date extubated: 08/30/16 Behavior/Cognition: Alert;Requires cueing Oral Cavity Assessment: Within Functional Limits Oral Care Completed by SLP: No Oral Cavity - Dentition: Missing dentition Vision: Functional for self-feeding Self-Feeding Abilities: Needs assist Patient Positioning: Upright in bed Baseline Vocal Quality: Normal Volitional Cough: Strong Volitional Swallow: Able to elicit    Oral/Motor/Sensory Function Overall Oral Motor/Sensory Function: Within functional limits   Ice Chips     Thin Liquid Thin Liquid: Impaired Presentation: Cup;Straw Other Comments: excessive belching    Nectar Thick Nectar Thick Liquid: Not tested   Honey Thick Honey Thick Liquid: Not tested   Puree Puree: Impaired Other Comments: excessive belching   Solid   GO   Solid: Not tested       Herbie Baltimore, MA CCC-SLP [phone]  Maccoy Haubner, Katherene Ponto 08/31/2016,12:19 PM

## 2016-08-31 NOTE — Progress Notes (Signed)
Report called to RN on 4E. 

## 2016-08-31 NOTE — Progress Notes (Signed)
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 female with a past medical history significant for dementia, pAF not on anticoag, CVA with R hemiparesis, CHF EF 35%, HTN, PVD s/p fem-fem bypass, seizures and legally blind who presents with left groin bulge.CT of the abdomen and pelvis with contrast showed a mildly obstructed 6 mm stone at the right UVJ, also a 7.5 cm left femoral pseudoaneurysm - patient transferred to [facility] and evaluated by Dr. Trula Slade , status post aneurysm repair  Assessment and plan 1. Pseudoaneurysm of left femoral artery:     s/p: repair left femoral pseudoaneurysm, thrombectomy left fem-pop bypass graft and fem-fem bypass graft, Revision of fem-fem bypass, revision right to left fem-fem bypass graft, 1 Day Post-Op  Deemed to have moderate to high CV risk perioperatively .  Postop anemia noted Hemoglobin 7.3 Continue empiric abx until wound cultures are back.   2. ABLA Hemoglobin drop from 13 >7.3 We will transfuse 2 units of packed red blood cells   2. Nephrolith with mild obstruction:  Asymptomatic.  This is an incidental finding. -Monitor for fever Urine culture shows multiple morphologies  3. Chronic systolic CHF:  most recent echo  02/03/13 EF 35%.  Currently appears euvolemic. -I/Os, daily weights  4. Dementia:  -Continue donepezil, sertraline, mirtazapine  5. Paroxysmal atrial fibrillation: variable  heart rate CHADS2Vasc 8, not on AC unclear why.   continue aspirin -Continue metoprolol PO, as well as prn cardizem iv   6. Hypertension:  Hypertensive at admission -Increase metoprolol and amlodipine doses -IV hydralazine for severe high BP -Restart enalaril  Prior to dc if renal fn stab;e   7. Seizures:  -Continue Keppra  8. Other medications: -Continue allopurinol  9. Legally blind:  10. AKI, CKD stage III: At baseline, creatinine 1-1.2. Creatinine is now 1.59 Patient received contrast yesterday with CT Dosed vancomycin carefully and continue to monitor renal function We will hydrate gently for a few hours and repeat renal function tomorrow    DVT prophylaxsis SCDs  Code Status:  Full code   Family Communication: Discussed in detail with the patient, all imaging results, lab results explained to the patient   Disposition Plan:  As  Per vascular surgery       Consultants:  Vascular  Procedures:  Repair of left femoral pseudoaneurysm   Antibiotics: Anti-infectives    Start     Dose/Rate Route Frequency Ordered Stop   08/30/16 1345  vancomycin (VANCOCIN) IVPB 750 mg/150 ml premix     750 mg 150 mL/hr over 60 Minutes Intravenous Every 24 hours 08/30/16 1334     08/30/16 1345  ceFEPIme (MAXIPIME) 1 g in dextrose 5 % 50 mL IVPB     1 g 100 mL/hr over 30 Minutes Intravenous Every  24 hours 08/30/16 1334           HPI/Subjective: Awake,  Noted to have a fib with RVR  Objective: Vitals:   08/31/16 0600 08/31/16 0700 08/31/16 0821 08/31/16 0838  BP: (!) 118/54 106/60    Pulse: (!) 47 (!) 106  (!) 110  Resp: (!) 28 (!) 29    Temp:   99.2 F (37.3 C)   TempSrc:   Oral   SpO2: 95% 95%    Weight:      Height:        Intake/Output Summary (Last 24 hours) at 08/31/16  0854 Last data filed at 08/31/16 0700  Gross per 24 hour  Intake           4427.5 ml  Output              850 ml  Net           3577.5 ml    Exam:  Examination:  General exam: Appears calm and comfortable  Respiratory system: Clear to auscultation. Respiratory effort normal. Cardiovascular system: S1 & S2 heard, RRR. No JVD, murmurs, rubs, gallops or clicks. No pedal edema. Gastrointestinal system: Abdomen is nondistended, soft and nontender. No organomegaly or masses felt. Normal bowel sounds heard. Central nervous system: Alert and oriented. No focal neurological deficits. Extremities: Symmetric 5 x 5 power. Skin: No rashes, lesions or ulcers Psychiatry: Judgement and insight appear normal. Mood & affect appropriate.     Data Reviewed: I have personally reviewed following labs and imaging studies  Micro Results Recent Results (from the past 240 hour(s))  Urine culture     Status: Abnormal   Collection Time: 08/29/16  7:22 PM  Result Value Ref Range Status   Specimen Description URINE, CLEAN CATCH  Final   Special Requests NONE  Final   Culture MULTIPLE SPECIES PRESENT, SUGGEST RECOLLECTION (A)  Final   Report Status 08/31/2016 FINAL  Final  MRSA PCR Screening     Status: None   Collection Time: 08/30/16  4:12 AM  Result Value Ref Range Status   MRSA by PCR NEGATIVE NEGATIVE Final    Comment:        The GeneXpert MRSA Assay (FDA approved for NASAL specimens only), is one component of a comprehensive MRSA colonization surveillance program. It is not intended to diagnose MRSA infection nor to guide or monitor treatment for MRSA infections.   Aerobic/Anaerobic Culture (surgical/deep wound)     Status: None (Preliminary result)   Collection Time: 08/30/16  9:34 AM  Result Value Ref Range Status   Specimen Description TISSUE LEFT FEMORAL ARTERY  Final   Special Requests GRAFT TISSUE POF VANC  Final   Gram Stain   Final    RARE WBC PRESENT, PREDOMINANTLY PMN NO  ORGANISMS SEEN    Culture PENDING  Incomplete   Report Status PENDING  Incomplete    Radiology Reports Ct Abdomen Pelvis W Contrast  Result Date: 08/29/2016 CLINICAL DATA:  Initial evaluation for acute abdominal pain, left rolling mass. EXAM: CT ABDOMEN AND PELVIS WITH CONTRAST TECHNIQUE: Multidetector CT imaging of the abdomen and pelvis was performed using the standard protocol following bolus administration of intravenous contrast. CONTRAST:  172mL ISOVUE-300 IOPAMIDOL (ISOVUE-300) INJECTION 61% COMPARISON:  Prior CT from 12/04/2011. FINDINGS: Lower chest: Scattered atelectatic changes noted within the visualized lung bases. Cardiomegaly partially visualized. No pleural or pericardial effusion. Hepatobiliary: Liver demonstrates a normal contrast enhanced appearance. Gallbladder within normal limits. No biliary dilatation. Pancreas: Pancreas somewhat  atrophic but otherwise unremarkable without acute inflammation or mass lesion. Spleen: Subcentimeter hypodensity noted within central aspect of the spleen, of doubtful significance. Spleen otherwise unremarkable. Adrenals/Urinary Tract: Adrenal glands within normal limits. Kidneys relatively equal in size with symmetric enhancement. Scattered cortical thinning and scarring. 5.6 cm cyst noted extending from the lower pole left kidney. Additional scattered subcentimeter hypodensities within the bilateral kidneys too small the characterize, but statistically likely reflects small cysts as well. Multiple nonobstructive calculi present within the right kidney, largest of which positioned within the lower pole and measures approximately 15 mm. Mild right hydroureteronephrosis. There is an obstructive 6 mm calculus at the right UVJ (series 2, image 64). No left-sided renal calculi. No left-sided hydronephrosis or hydroureter. Bladder largely decompressed. Mild circumferential bladder wall thickening like related incomplete distension. Stomach/Bowel: Stomach within  normal limits. No evidence for bowel obstruction. Appendix within normal limits. No acute inflammatory changes seen about the bowels. Large volume stool seen packed within the rectal vault, suggesting constipation. Vascular/Lymphatic: Extensive atherosclerosis throughout the intra-abdominal aorta and its branch vessels. Origin of the celiac axis, SMA, renal arteries, and IMA are grossly patent. Occlusion of the left common iliac just distal to its origin (series 2, image 42). Left internal iliac occluded proximally, with distal reconstitution likely via collateralization. Occluded vascular stent in place within the left external iliac artery. Extensive atherosclerotic disease throughout the right iliac system. Fem-fem bypass graft in place. That right common femoral anastomosis appears patent. Large pseudoaneurysm measuring 4.4 x 5.7 x 7.5 cm arises from the left common femoral anastomosis in the left inguinal region (series 2, image 63). Opacification of the left common femoral artery distally. Reproductive: Scattered calcified uterine fibroids noted. Uterus and ovaries otherwise within normal limits. Other: No free air or fluid. Musculoskeletal: Small intramuscular lipoma noted within the left abdominus musculature. No acute osseous abnormality. No worrisome lytic or blastic osseous lesions. IMPRESSION: 1. 4.4 x 5.7 x 7.5 cm pseudoaneurysm arising from the left aspect of a fem-fem bypass graft in the left inguinal region. Finding suggestive of graft failure. Superimposed infection not excluded. 2. Extensive atherosclerotic calcifications throughout the intra-abdominal aorta and its branch vessels. 3. 6 mm calculus at the right UVJ with secondary mild right hydroureteronephrosis. Additional nonobstructive right renal nephrolithiasis as above. 4. Large volume stool within the rectal vault, suggesting constipation. Critical Value/emergent results were called by telephone at the time of interpretation on 08/29/2016 at  11:29 pm to Dr. Gareth Morgan , who verbally acknowledged these results. Electronically Signed   By: Jeannine Boga M.D.   On: 08/29/2016 23:34     CBC  Recent Labs Lab 08/29/16 1930 08/31/16 0350  WBC 8.3 12.2*  HGB 13.0 7.3*  HCT 38.7 22.3*  PLT 179 137*  MCV 79.5 80.2  MCH 26.7 26.3  MCHC 33.6 32.7  RDW 15.8* 16.2*  LYMPHSABS 1.7  --   MONOABS 0.8  --   EOSABS 0.2  --   BASOSABS 0.0  --     Chemistries   Recent Labs Lab 08/29/16 1930 08/31/16 0151  NA 140 138  K 4.2 4.6  CL 105 108  CO2 26 21*  GLUCOSE 132* 192*  BUN 28* 27*  CREATININE 1.20* 1.59*  CALCIUM 9.8 8.6*  AST 18  --   ALT 15  --   ALKPHOS 90  --   BILITOT 0.7  --    ------------------------------------------------------------------------------------------------------------------ estimated creatinine clearance is 27.1 mL/min (A) (by C-G formula based on SCr of 1.59 mg/dL (H)). ------------------------------------------------------------------------------------------------------------------  Recent Labs  08/30/16 1917  HGBA1C 6.6*   ------------------------------------------------------------------------------------------------------------------ No results for input(s): CHOL, HDL, LDLCALC, TRIG, CHOLHDL, LDLDIRECT in the last 72 hours. ------------------------------------------------------------------------------------------------------------------ No results for input(s): TSH, T4TOTAL, T3FREE, THYROIDAB in the last 72 hours.  Invalid input(s): FREET3 ------------------------------------------------------------------------------------------------------------------ No results for input(s): VITAMINB12, FOLATE, FERRITIN, TIBC, IRON, RETICCTPCT in the last 72 hours.  Coagulation profile  Recent Labs Lab 08/30/16 0003  INR 1.01    No results for input(s): DDIMER in the last 72 hours.  Cardiac Enzymes No results for input(s): CKMB, TROPONINI, MYOGLOBIN in the last 168  hours.  Invalid input(s): CK ------------------------------------------------------------------------------------------------------------------ Invalid input(s): POCBNP   CBG:  Recent Labs Lab 08/30/16 1216 08/30/16 2013 08/31/16 0817  GLUCAP 191* 223* 200*       Studies: Ct Abdomen Pelvis W Contrast  Result Date: 08/29/2016 CLINICAL DATA:  Initial evaluation for acute abdominal pain, left rolling mass. EXAM: CT ABDOMEN AND PELVIS WITH CONTRAST TECHNIQUE: Multidetector CT imaging of the abdomen and pelvis was performed using the standard protocol following bolus administration of intravenous contrast. CONTRAST:  144mL ISOVUE-300 IOPAMIDOL (ISOVUE-300) INJECTION 61% COMPARISON:  Prior CT from 12/04/2011. FINDINGS: Lower chest: Scattered atelectatic changes noted within the visualized lung bases. Cardiomegaly partially visualized. No pleural or pericardial effusion. Hepatobiliary: Liver demonstrates a normal contrast enhanced appearance. Gallbladder within normal limits. No biliary dilatation. Pancreas: Pancreas somewhat atrophic but otherwise unremarkable without acute inflammation or mass lesion. Spleen: Subcentimeter hypodensity noted within central aspect of the spleen, of doubtful significance. Spleen otherwise unremarkable. Adrenals/Urinary Tract: Adrenal glands within normal limits. Kidneys relatively equal in size with symmetric enhancement. Scattered cortical thinning and scarring. 5.6 cm cyst noted extending from the lower pole left kidney. Additional scattered subcentimeter hypodensities within the bilateral kidneys too small the characterize, but statistically likely reflects small cysts as well. Multiple nonobstructive calculi present within the right kidney, largest of which positioned within the lower pole and measures approximately 15 mm. Mild right hydroureteronephrosis. There is an obstructive 6 mm calculus at the right UVJ (series 2, image 64). No left-sided renal calculi. No  left-sided hydronephrosis or hydroureter. Bladder largely decompressed. Mild circumferential bladder wall thickening like related incomplete distension. Stomach/Bowel: Stomach within normal limits. No evidence for bowel obstruction. Appendix within normal limits. No acute inflammatory changes seen about the bowels. Large volume stool seen packed within the rectal vault, suggesting constipation. Vascular/Lymphatic: Extensive atherosclerosis throughout the intra-abdominal aorta and its branch vessels. Origin of the celiac axis, SMA, renal arteries, and IMA are grossly patent. Occlusion of the left common iliac just distal to its origin (series 2, image 42). Left internal iliac occluded proximally, with distal reconstitution likely via collateralization. Occluded vascular stent in place within the left external iliac artery. Extensive atherosclerotic disease throughout the right iliac system. Fem-fem bypass graft in place. That right common femoral anastomosis appears patent. Large pseudoaneurysm measuring 4.4 x 5.7 x 7.5 cm arises from the left common femoral anastomosis in the left inguinal region (series 2, image 63). Opacification of the left common femoral artery distally. Reproductive: Scattered calcified uterine fibroids noted. Uterus and ovaries otherwise within normal limits. Other: No free air or fluid. Musculoskeletal: Small intramuscular lipoma noted within the left abdominus musculature. No acute osseous abnormality. No worrisome lytic or blastic osseous lesions. IMPRESSION: 1. 4.4 x 5.7 x 7.5 cm pseudoaneurysm arising from the left aspect of a fem-fem bypass graft in the left inguinal region. Finding suggestive of graft failure. Superimposed infection not excluded. 2. Extensive atherosclerotic calcifications throughout the intra-abdominal  aorta and its branch vessels. 3. 6 mm calculus at the right UVJ with secondary mild right hydroureteronephrosis. Additional nonobstructive right renal nephrolithiasis as  above. 4. Large volume stool within the rectal vault, suggesting constipation. Critical Value/emergent results were called by telephone at the time of interpretation on 08/29/2016 at 11:29 pm to Dr. Gareth Morgan , who verbally acknowledged these results. Electronically Signed   By: Jeannine Boga M.D.   On: 08/29/2016 23:34      Lab Results  Component Value Date   HGBA1C 6.6 (H) 08/30/2016   HGBA1C 6.9 05/16/2016   HGBA1C 5.6 09/14/2015   Lab Results  Component Value Date   MICROALBUR <1.2 11/10/2015   LDLCALC 41 11/10/2015   CREATININE 1.59 (H) 08/31/2016       Scheduled Meds: . allopurinol  100 mg Oral Daily  . amLODipine  5 mg Oral Daily  . aspirin  81 mg Oral Daily  . atorvastatin  10 mg Oral QHS  . docusate sodium  100 mg Oral Daily  . donepezil  10 mg Oral QHS  . feeding supplement (PRO-STAT SUGAR FREE 64)  30 mL Oral BID  . insulin aspart  0-9 Units Subcutaneous TID WC  . levETIRAcetam  250 mg Oral BID  . metoprolol tartrate  25 mg Oral QHS  . metoprolol tartrate  50 mg Oral Daily  . mirtazapine  7.5 mg Oral QHS  . pantoprazole  40 mg Oral Daily  . sertraline  50 mg Oral Daily   Continuous Infusions: . sodium chloride    . sodium chloride 75 mL/hr at 08/31/16 0500  . sodium chloride    . ceFEPime (MAXIPIME) IV Stopped (08/30/16 1445)  . magnesium sulfate 1 - 4 g bolus IVPB    . vancomycin Stopped (08/30/16 1557)     LOS: 1 day    Time spent: >30 MINS    Reyne Dumas  [facility] Pager [phone]. If 7PM-7AM, please contact night-coverage at [email or web address], password [facility] 08/31/2016, 8:54 AM  LOS: 1 day

## 2016-08-31 NOTE — Progress Notes (Signed)
Patient arrived the unit from [facility], on a hospital bed, vital signs obtained, placed on tele ccmd notified, patient oriented to room and staff, bed in lowest position,call bell within reach will continue to monitor.

## 2016-08-31 NOTE — Progress Notes (Signed)
Pt receiving/ordered 2 units PRBCs

## 2016-08-31 NOTE — Progress Notes (Signed)
  Progress Note  SUBJECTIVE:    POD #1  Baseline dementia. Groaning in pain when palpating around fem-fem.   OBJECTIVE:   Vitals:   08/31/16 0600 08/31/16 0700  BP: (!) 118/54 106/60  Pulse: (!) 47 (!) 106  Resp: (!) 28 (!) 29  Temp:      Intake/Output Summary (Last 24 hours) at 08/31/16 0818 Last data filed at 08/31/16 0700  Gross per 24 hour  Intake           4427.5 ml  Output             1000 ml  Net           3427.5 ml   Left groin with VAC and JP drain. <10 cc output in JP. Suprapubic incision intact. Audible fem-fem doppler signal. Monophasic left PT signal. Monophasic signal behind left lateral ankle Monophasic right DP.  Feet are warm. Moving toes bilaterally.  ASSESSMENT/PLAN:   76 y.o. female is s/p: repair left femoral pseudoaneurysm, thrombectomy left fem-pop bypass graft and fem-fem bypass graft Revision of fem-fem bypass, revision right to left fem-fem bypass graft, 1 Day Post-Op   S/p revision fem-fem and left fem-pop bypass grafts: Fem-fem bypass is patent. PT signal left foot is not as brisk as yesterday post-op. Feet without ischemic changes. Continue VAC left groin. Continue abx until wound cultures are back.  Oliguria: Creatinine is 1.59 this am. 1.2 on 08/29/16. Continue gentle hydration. Keep foley. May consider d/c vancomycin. Not on any other nephrotoxic drugs. Follow-up creatinine in am.  Acute blood loss anemia: Hgb 7.3 this am. Has been tachycardic. SBP in low 100s this am. Will consider transfusion. D/w Dr. Julien Nordmann A Donnovan Stamour 08/31/2016 8:18 AM   Addendum Will transfuse 2 units of prbcs for symptomatic anemia. Ok to transfer to 4E.   Virgina Jock, PA-C 10:25 AM  -- LABS:   CBC    Component Value Date/Time   WBC 12.2 (H) 08/31/2016 0350   HGB 7.3 (L) 08/31/2016 0350   HCT 22.3 (L) 08/31/2016 0350   PLT 137 (L) 08/31/2016 0350    BMET    Component Value Date/Time   NA 138 08/31/2016 0151   NA 142 05/16/2016   K 4.6  08/31/2016 0151   CL 108 08/31/2016 0151   CO2 21 (L) 08/31/2016 0151   GLUCOSE 192 (H) 08/31/2016 0151   BUN 27 (H) 08/31/2016 0151   BUN 32 (A) 05/16/2016   CREATININE 1.59 (H) 08/31/2016 0151   CALCIUM 8.6 (L) 08/31/2016 0151   GFRNONAA 30 (L) 08/31/2016 0151   GFRAA 35 (L) 08/31/2016 0151    COAG Lab Results  Component Value Date   INR 1.01 08/30/2016   INR 1.59 (H) 02/07/2013   INR 1.38 02/06/2013   No results found for: PTT  ANTIBIOTICS:   Anti-infectives    Start     Dose/Rate Route Frequency Ordered Stop   08/30/16 1345  vancomycin (VANCOCIN) IVPB 750 mg/150 ml premix     750 mg 150 mL/hr over 60 Minutes Intravenous Every 24 hours 08/30/16 1334     08/30/16 1345  ceFEPIme (MAXIPIME) 1 g in dextrose 5 % 50 mL IVPB     1 g 100 mL/hr over 30 Minutes Intravenous Every 24 hours 08/30/16 [place], PA-C [facility] Office: [phone] Pager: [phone] 08/31/2016 8:18 AM

## 2016-08-31 NOTE — NC FL2 (Signed)
[place] MEDICAID FL2 LEVEL OF CARE SCREENING TOOL     IDENTIFICATION  Patient Name: Casey [name] Birthdate: 1940/12/01 Sex: female Admission Date (Current Location): 08/29/2016  [facility] and [place] Number:  Herbalist and Address:  [facility] [place], [place] [address] [place], [place] [address]      Provider Number: [id number]  Attending Physician Name and Address:  Reyne Dumas, MD  Relative Name and Phone Number:       Current Level of Care: Hospital Recommended Level of Care: [place] Prior Approval Number:    Date Approved/Denied:   PASRR Number: [id number]  Discharge Plan: SNF    Current Diagnoses: Patient Active Problem List   Diagnosis Date Noted  . Pseudoaneurysm of femoral artery ([place]) 08/30/2016  . Vascular dementia without behavioral disturbance 08/29/2016  . History of CVA with residual deficit 08/29/2016  . Type 2 diabetes mellitus with diabetic neuropathy, without long-term current use of insulin ([place]) 08/29/2016  . Depression 04/24/2016  . Loss of weight 04/24/2016  . Hypertensive heart disease with congestive heart failure ([place]) 12/30/2014  . Dyslipidemia associated with type 2 diabetes mellitus ([place]) 12/30/2014  . CKD stage 3 due to type 2 diabetes mellitus ([place]) 12/06/2014  . 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 Chronic systolic congestive heart failure ([place]) 02/09/2013  . Solitary pulmonary nodule 02/09/2013  . Seizures ([place])   . Vision disturbance following CVA (cerebrovascular accident) 02/02/2013  . PVD (peripheral vascular disease) ([place]) 09/30/2012  . Gout of multiple sites 05/06/2012  . Dementia without behavioral disturbance 05/06/2012  . Atherosclerosis of native artery of extremity with intermittent claudication ([place]) 02/20/2012  . Type 2 diabetes mellitus with diabetic neuropathy ([place]) 12/29/2010  . Atrial fibrillation ([place])   . Late effects of CVA (cerebrovascular accident)     Orientation  RESPIRATION BLADDER Height & Weight     Self  Normal Incontinent, Indwelling catheter Weight: 126 lb 8.7 oz (57.4 kg) Height:  5\' 5"  (165.1 cm)  BEHAVIORAL SYMPTOMS/MOOD NEUROLOGICAL BOWEL NUTRITION STATUS      Incontinent Diet (see DC summary)  AMBULATORY STATUS COMMUNICATION OF NEEDS Skin   Extensive Assist Verbally Surgical wounds                       Personal Care Assistance Level of Assistance  Bathing, Dressing, Feeding Bathing Assistance: Maximum assistance Feeding assistance: Limited assistance Dressing Assistance: Maximum assistance     Functional Limitations Info             SPECIAL CARE FACTORS FREQUENCY                       Contractures      Additional Factors Info  Code Status, Allergies, Insulin Sliding Scale, Psychotropic Code Status Info: FULL Allergies Info: Penicillins Psychotropic Info: zoloft Insulin Sliding Scale Info: 3/day       Current Medications (08/31/2016):  This is the current hospital active medication list Current Facility-Administered Medications  Medication Dose Route Frequency Provider Last Rate Last Dose  . 0.9 %  sodium chloride infusion  500 mL Intravenous Once PRN Rhyne, Samantha J, PA-C      . 0.9 %  sodium chloride infusion   Intravenous Continuous Reyne Dumas, MD 75 mL/hr at 08/31/16 0906    . acetaminophen (TYLENOL) tablet 650 mg  650 mg Oral Q6H PRN Danford, Suann Larry, MD       Or  . acetaminophen (TYLENOL) suppository 650  mg  650 mg Rectal Q6H PRN Danford, Suann Larry, MD      . allopurinol (ZYLOPRIM) tablet 100 mg  100 mg Oral Daily Edwin Dada, MD   100 mg at 08/31/16 7371  . alum & mag hydroxide-simeth (MAALOX/MYLANTA) 200-200-20 MG/5ML suspension 15-30 mL  15-30 mL Oral Q2H PRN Rhyne, Samantha J, PA-C      . amLODipine (NORVASC) tablet 5 mg  5 mg Oral Daily Danford, Suann Larry, MD   5 mg at 08/31/16 0937  . aspirin chewable tablet 81 mg  81 mg Oral Daily Gabriel Earing, PA-C   81 mg  at 08/31/16 0626  . atorvastatin (LIPITOR) tablet 10 mg  10 mg Oral QHS Edwin Dada, MD   10 mg at 08/30/16 2136  . ceFEPIme (MAXIPIME) 1 g in dextrose 5 % 50 mL IVPB  1 g Intravenous Q24H Einar Grad, [facility]   Stopped at 08/30/16 1445  . chlorhexidine (PERIDEX) 0.12 % solution 15 mL  15 mL Mouth Rinse BID Serafina Mitchell, MD   15 mL at 08/31/16 0937  . docusate sodium (COLACE) capsule 100 mg  100 mg Oral Daily Rhyne, Samantha J, PA-C   100 mg at 08/31/16 0936  . donepezil (ARICEPT) tablet 10 mg  10 mg Oral QHS Danford, Suann Larry, MD   10 mg at 08/30/16 2200  . feeding supplement (PRO-STAT SUGAR FREE 64) liquid 30 mL  30 mL Oral BID Edwin Dada, MD   30 mL at 08/31/16 0938  . guaiFENesin-dextromethorphan (ROBITUSSIN DM) 100-10 MG/5ML syrup 15 mL  15 mL Oral Q4H PRN Rhyne, Samantha J, PA-C      . hydrALAZINE (APRESOLINE) tablet 25 mg  25 mg Oral Q6H PRN Danford, Christopher P, MD      . insulin aspart (novoLOG) injection 0-9 Units  0-9 Units Subcutaneous TID WC Reyne Dumas, MD   2 Units at 08/31/16 [phone]  . labetalol (NORMODYNE,TRANDATE) injection 10 mg  10 mg Intravenous Q10 min PRN Rhyne, Hulen Shouts, PA-C   10 mg at 08/30/16 2038  . levETIRAcetam (KEPPRA) tablet 250 mg  250 mg Oral BID Edwin Dada, MD   250 mg at 08/31/16 4627  . magnesium sulfate IVPB 2 g 50 mL  2 g Intravenous Daily PRN Rhyne, Samantha J, PA-C      . MEDLINE mouth rinse  15 mL Mouth Rinse q12n4p Serafina Mitchell, MD      . metoprolol tartrate (LOPRESSOR) tablet 25 mg  25 mg Oral QHS Danford, Suann Larry, MD   25 mg at 08/30/16 2200  . metoprolol tartrate (LOPRESSOR) tablet 50 mg  50 mg Oral Daily Edwin Dada, MD   50 mg at 08/31/16 0350  . mirtazapine (REMERON) tablet 7.5 mg  7.5 mg Oral QHS Danford, Suann Larry, MD   7.5 mg at 08/30/16 2200  . morphine 4 MG/ML injection 2 mg  2 mg Intravenous Q2H PRN Reyne Dumas, MD   2 mg at 08/31/16 0448  . ondansetron (ZOFRAN)  injection 4 mg  4 mg Intravenous Q6H PRN Rhyne, Samantha J, PA-C      . oxyCODONE (Oxy IR/ROXICODONE) immediate release tablet 5 mg  5 mg Oral Q4H PRN Danford, Suann Larry, MD      . pantoprazole (PROTONIX) EC tablet 40 mg  40 mg Oral Daily Rhyne, Samantha J, PA-C   40 mg at 08/31/16 0936  . phenol (CHLORASEPTIC) mouth spray 1 spray  1 spray Mouth/Throat PRN Rhyne,  Hulen Shouts, PA-C      . sertraline (ZOLOFT) tablet 50 mg  50 mg Oral Daily Edwin Dada, MD   50 mg at 08/31/16 1624  . vancomycin (VANCOCIN) IVPB 750 mg/150 ml premix  750 mg Intravenous Q24H Einar Grad [facility]   Stopped at 08/30/16 1557     Discharge Medications: Please see discharge summary for a list of discharge medications.  Relevant Imaging Results:  Relevant Lab Results:   Additional Information SS#: [id number]  Jorge Ny, LCSW

## 2016-08-31 NOTE — Progress Notes (Signed)
Inpatient Diabetes Program Recommendations  AACE/ADA: New Consensus Statement on Inpatient Glycemic Control (2015)  Target Ranges:  Prepandial:   less than 140 mg/dL      Peak postprandial:   less than 180 mg/dL (1-2 hours)      Critically ill patients:  140 - 180 mg/dL   Results for ARLANDA, SHIPLETT (MRN [id number]) as of 08/31/2016 07:59  Ref. Range 08/30/2016 12:16 08/30/2016 20:13  Glucose-Capillary Latest Ref Range: 65 - 99 mg/dL 191 (H) 223 (H)  Results for FLEETA, KUNDE (MRN [id number]) as of 08/31/2016 07:59  Ref. Range 08/30/2016 19:17 08/31/2016 01:51  Glucose Latest Ref Range: 65 - 99 mg/dL  192 (H)  Hemoglobin A1C Latest Ref Range: 4.8 - 5.6 % 6.6 (H)    Review of Glycemic Control  Diabetes history: DM2 Outpatient Diabetes medications: None Current orders for Inpatient glycemic control: Novolog 0-9 units TID with meals  Inpatient Diabetes Program Recommendations: Correction (SSI): Please consider ordering Novolog 0-5 units QHS for bedtime correction.  Thanks, Barnie Alderman, RN, MSN, CDE Diabetes Coordinator Inpatient Diabetes Program [phone] (Team Pager from 8am to 5pm)

## 2016-09-01 DIAGNOSIS — I724 Aneurysm of artery of lower extremity: Secondary | ICD-10-CM

## 2016-09-01 DIAGNOSIS — N183 Chronic kidney disease, stage 3 (moderate): Secondary | ICD-10-CM

## 2016-09-01 DIAGNOSIS — E1122 Type 2 diabetes mellitus with diabetic chronic kidney disease: Secondary | ICD-10-CM

## 2016-09-01 LAB — BPAM RBC
BLOOD PRODUCT EXPIRATION DATE: 201808162359
Blood Product Expiration Date: 201808172359
ISSUE DATE / TIME: 201808031055
ISSUE DATE / TIME: 201808030853
UNIT TYPE AND RH: 7300
Unit Type and Rh: 7300

## 2016-09-01 LAB — CBC
HCT: 25.9 % — ABNORMAL LOW (ref 36.0–46.0)
Hemoglobin: 8.7 g/dL — ABNORMAL LOW (ref 12.0–15.0)
MCH: 28.1 pg (ref 26.0–34.0)
MCHC: 33.6 g/dL (ref 30.0–36.0)
MCV: 83.5 fL (ref 78.0–100.0)
PLATELETS: 115 10*3/uL — AB (ref 150–400)
RBC: 3.1 MIL/uL — AB (ref 3.87–5.11)
RDW: 16.3 % — AB (ref 11.5–15.5)
WBC: 12 10*3/uL — AB (ref 4.0–10.5)

## 2016-09-01 LAB — TYPE AND SCREEN
ABO/RH(D): B POS
ANTIBODY SCREEN: NEGATIVE
UNIT DIVISION: 0
Unit division: 0

## 2016-09-01 LAB — GLUCOSE, CAPILLARY
GLUCOSE-CAPILLARY: 145 mg/dL — AB (ref 65–99)
GLUCOSE-CAPILLARY: 177 mg/dL — AB (ref 65–99)
GLUCOSE-CAPILLARY: 178 mg/dL — AB (ref 65–99)
Glucose-Capillary: 134 mg/dL — ABNORMAL HIGH (ref 65–99)

## 2016-09-01 LAB — BASIC METABOLIC PANEL
Anion gap: 5 (ref 5–15)
BUN: 30 mg/dL — AB (ref 6–20)
CALCIUM: 8.8 mg/dL — AB (ref 8.9–10.3)
CHLORIDE: 110 mmol/L (ref 101–111)
CO2: 22 mmol/L (ref 22–32)
CREATININE: 1.44 mg/dL — AB (ref 0.44–1.00)
GFR calc non Af Amer: 34 mL/min — ABNORMAL LOW (ref >60)
GFR, EST AFRICAN AMERICAN: 40 mL/min — AB (ref >60)
Glucose, Bld: 134 mg/dL — ABNORMAL HIGH (ref 65–99)
Potassium: 4 mmol/L (ref 3.5–5.1)
SODIUM: 137 mmol/L (ref 135–145)

## 2016-09-01 NOTE — Progress Notes (Signed)
  Speech Language Pathology Treatment: Dysphagia  Patient Details Name: Casey [name] MRN: [id number] DOB: 21-Feb-1940 Today's Date: 09/01/2016 Time: 8421-0312 SLP Time Calculation (min) (ACUTE ONLY): 14 min  Assessment / Plan / Recommendation Clinical Impression  Patient seen for dysphagia treatment and to reassess appropriateness for advancing to solids from clear liquids. Pt's presentation today consistent with yesterday's: she presents with consistent, excessive belching with all liquids, complains of regurgitation and is noted to frequently re-swallow after belching. No overt signs of aspiration noted, however pt remains at risk for aspirating gastric contents. Recommend continuing clear liquids for now with esophageal precautions; pt should be fully upright for PO and remain in upright position for 30 minutes after intake. SLP will f/u at bedside next date for improvements, advancement if appropriate.     HPI HPI: Casey Baley Kittrellis a 76 y.o.femalewith a past medical history significant for dementia, pAF not on anticoag, CVA with R hemiparesis, CHF EF 35%, HTN, PVD s/p fem-fem bypass, seizures and legally blindwho presents with left groin bulge. s/p: repair left femoral pseudoaneurysm, thrombectomy left fem-pop bypass graft and fem-fem bypass graft      SLP Plan  Continue with current plan of care       Recommendations  Diet recommendations: Thin liquid;Other(comment) (clear liquids) Liquids provided via: Cup;Straw Medication Administration: Whole meds with liquid Supervision: Patient able to self feed;Staff to assist with self feeding Compensations: Slow rate;Small sips/bites Postural Changes and/or Swallow Maneuvers: Upright 30-60 min after meal;Seated upright 90 degrees                Oral Care Recommendations: Oral care BID Follow up Recommendations: Skilled Nursing facility SLP Visit Diagnosis: Dysphagia, pharyngoesophageal phase (R13.14) Plan: Continue with  current plan of care       [place], [place], [place] Speech-Language Pathologist [place] 09/01/2016, 1:41 PM

## 2016-09-01 NOTE — Progress Notes (Signed)
[facility] PROGRESS NOTE  Casey [name] TJQ:[id number] DOB: April 08, 1940 DOA: 08/29/2016   PCP: Gildardo Cranker, DO     Assessment/Plan: Principal Problem:   Pseudoaneurysm of femoral artery ([place]) Active Problems:   Atrial fibrillation ([place])   Type 2 diabetes mellitus with diabetic neuropathy ([place])   Atherosclerosis of native artery of extremity with intermittent claudication ([place])   Dementia without behavioral disturbance   Chronic systolic congestive heart failure (HCC)   Seizures (HCC)   Legally blind   CKD stage 3 due to type 2 diabetes mellitus ([place])   Hypertensive heart disease with congestive heart failure ([place])   Vascular dementia without behavioral disturbance   History of CVA with residual deficit   Casey [name] is a 76 y.o. female with a past medical history significant for dementia, pAF not on anticoag, CVA with R hemiparesis, CHF EF 35%, HTN, PVD s/p fem-fem bypass, seizures and legally blind who presents with left groin bulge. CT of the abdomen and pelvis with contrast showed a mildly obstructed 6 mm stone at the right UVJ, also a 7.5 cm left femoral pseudoaneurysm - patient transferred to [facility] and evaluated by Dr. Trula Slade , status post aneurysm repair  Assessment and plan 1. Pseudoaneurysm of left femoral artery:     s/p: repair left femoral pseudoaneurysm, thrombectomy left fem-pop bypass graft and fem-fem bypass graft, Revision of fem-fem bypass, revision right to left fem-fem bypass graft, 1 Day Post-Op  Deemed to have moderate to high CV risk perioperatively .  Postop anemia noted Hemoglobin 7.3 Continue empiric abx until wound cultures are back.   2. ABLA Hemoglobin drop from 13 >7.3  transfused 2 units of packed red blood cells   2. Nephrolith with mild obstruction:  Asymptomatic.  This is an incidental finding. -Monitor for fever Urine culture shows multiple morphologies  3. Chronic systolic CHF:  most recent echo  02/03/13 EF 35%.  Currently appears euvolemic. -I/Os, daily weights  4. Dementia:  -Continue donepezil, sertraline, mirtazapine  5. Paroxysmal atrial fibrillation:  CHADS2Vasc 8,  not on AC - continue aspirin Rate control  6. Hypertension:  Hypertensive at admission -Increase metoprolol and amlodipine doses -IV hydralazine for severe high BP -Restart enalaril  Prior to dc if renal fn stab;e   7. Seizures:  -Continue Keppra  8. Other medications: -Continue allopurinol  9. Legally blind:  10. AKI, CKD stage III: baseline, cr1-1.2. Patient received contrast with CT 08/31/16 Gentle hydration with clinical follow-up    DVT prophylaxsis SCDs  Code Status:  Full code   Family Communication: Discussed in detail with the patient, all imaging results, lab results explained to the patient   Disposition Plan:  As  Per vascular surgery       Consultants:  Vascular  Procedures:  Repair of left femoral pseudoaneurysm   Antibiotics: Anti-infectives    Start     Dose/Rate Route Frequency Ordered Stop   08/31/16 1800  vancomycin (VANCOCIN) 500 mg in sodium chloride 0.9 % 100 mL IVPB     500 mg 100 mL/hr over 60 Minutes Intravenous Every 24 hours 08/31/16 1111     08/30/16 1345  vancomycin (VANCOCIN) IVPB 750 mg/150 ml premix  Status:  Discontinued     750 mg 150 mL/hr over 60 Minutes Intravenous Every 24 hours 08/30/16 1334 08/31/16 1111   08/30/16 1345  ceFEPIme (MAXIPIME) 1 g in dextrose 5 % 50 mL IVPB     1 g 100 mL/hr over  30 Minutes Intravenous Every 24 hours 08/30/16 1334           HPI/Subjective: Awake,  Noted to have a fib with RVR  Objective: Vitals:   08/31/16 2010 08/31/16 2249 09/01/16 0034 09/01/16 0446  BP: (!) 134/51 125/62 (!) 131/56 (!) 124/59  Pulse: 92 80 86 85  Resp: (!) 27  (!) 24 (!) 23  Temp: 98.1 F (36.7 C)  99.2 F (37.3 C) 99.5 F (37.5 C)  TempSrc:   Axillary Axillary  SpO2: 95%  97% 94%  Weight:    61.8 kg (136 lb 3.9  oz)  Height:        Intake/Output Summary (Last 24 hours) at 09/01/16 1042 Last data filed at 09/01/16 0751  Gross per 24 hour  Intake          1611.49 ml  Output              495 ml  Net          1116.49 ml    Exam:  Examination:  General exam: Appears calm and comfortable  Respiratory system: Clear to auscultation. Respiratory effort normal. Cardiovascular system: S1 & S2 heard, RRR. No JVD, murmurs, rubs, gallops or clicks. No pedal edema. Gastrointestinal system: Abdomen is nondistended, soft and nontender. No organomegaly or masses felt. Normal bowel sounds heard. Central nervous system: Alert and oriented. No focal neurological deficits. Extremities: Symmetric 5 x 5 power. Skin: No rashes, lesions or ulcers Psychiatry: Judgement and insight appear normal. Mood & affect appropriate.     Data Reviewed: I have personally reviewed following labs and imaging studies  Micro Results Recent Results (from the past 240 hour(s))  Urine culture     Status: Abnormal   Collection Time: 08/29/16  7:22 PM  Result Value Ref Range Status   Specimen Description URINE, CLEAN CATCH  Final   Special Requests NONE  Final   Culture MULTIPLE SPECIES PRESENT, SUGGEST RECOLLECTION (A)  Final   Report Status 08/31/2016 FINAL  Final  MRSA PCR Screening     Status: None   Collection Time: 08/30/16  4:12 AM  Result Value Ref Range Status   MRSA by PCR NEGATIVE NEGATIVE Final    Comment:        The GeneXpert MRSA Assay (FDA approved for NASAL specimens only), is one component of a comprehensive MRSA colonization surveillance program. It is not intended to diagnose MRSA infection nor to guide or monitor treatment for MRSA infections.   Aerobic/Anaerobic Culture (surgical/deep wound)     Status: None (Preliminary result)   Collection Time: 08/30/16  9:34 AM  Result Value Ref Range Status   Specimen Description TISSUE LEFT FEMORAL ARTERY  Final   Special Requests GRAFT TISSUE POF VANC   Final   Gram Stain   Final    RARE WBC PRESENT, PREDOMINANTLY PMN NO ORGANISMS SEEN    Culture NO GROWTH 1 DAY  Final   Report Status PENDING  Incomplete    Radiology Reports Ct Abdomen Pelvis W Contrast  Result Date: 08/29/2016 CLINICAL DATA:  Initial evaluation for acute abdominal pain, left rolling mass. EXAM: CT ABDOMEN AND PELVIS WITH CONTRAST TECHNIQUE: Multidetector CT imaging of the abdomen and pelvis was performed using the standard protocol following bolus administration of intravenous contrast. CONTRAST:  156mL ISOVUE-300 IOPAMIDOL (ISOVUE-300) INJECTION 61% COMPARISON:  Prior CT from 12/04/2011. FINDINGS: Lower chest: Scattered atelectatic changes noted within the visualized lung bases. Cardiomegaly partially visualized. No pleural or pericardial effusion. Hepatobiliary: Liver  demonstrates a normal contrast enhanced appearance. Gallbladder within normal limits. No biliary dilatation. Pancreas: Pancreas somewhat atrophic but otherwise unremarkable without acute inflammation or mass lesion. Spleen: Subcentimeter hypodensity noted within central aspect of the spleen, of doubtful significance. Spleen otherwise unremarkable. Adrenals/Urinary Tract: Adrenal glands within normal limits. Kidneys relatively equal in size with symmetric enhancement. Scattered cortical thinning and scarring. 5.6 cm cyst noted extending from the lower pole left kidney. Additional scattered subcentimeter hypodensities within the bilateral kidneys too small the characterize, but statistically likely reflects small cysts as well. Multiple nonobstructive calculi present within the right kidney, largest of which positioned within the lower pole and measures approximately 15 mm. Mild right hydroureteronephrosis. There is an obstructive 6 mm calculus at the right UVJ (series 2, image 64). No left-sided renal calculi. No left-sided hydronephrosis or hydroureter. Bladder largely decompressed. Mild circumferential bladder wall  thickening like related incomplete distension. Stomach/Bowel: Stomach within normal limits. No evidence for bowel obstruction. Appendix within normal limits. No acute inflammatory changes seen about the bowels. Large volume stool seen packed within the rectal vault, suggesting constipation. Vascular/Lymphatic: Extensive atherosclerosis throughout the intra-abdominal aorta and its branch vessels. Origin of the celiac axis, SMA, renal arteries, and IMA are grossly patent. Occlusion of the left common iliac just distal to its origin (series 2, image 42). Left internal iliac occluded proximally, with distal reconstitution likely via collateralization. Occluded vascular stent in place within the left external iliac artery. Extensive atherosclerotic disease throughout the right iliac system. Fem-fem bypass graft in place. That right common femoral anastomosis appears patent. Large pseudoaneurysm measuring 4.4 x 5.7 x 7.5 cm arises from the left common femoral anastomosis in the left inguinal region (series 2, image 63). Opacification of the left common femoral artery distally. Reproductive: Scattered calcified uterine fibroids noted. Uterus and ovaries otherwise within normal limits. Other: No free air or fluid. Musculoskeletal: Small intramuscular lipoma noted within the left abdominus musculature. No acute osseous abnormality. No worrisome lytic or blastic osseous lesions. IMPRESSION: 1. 4.4 x 5.7 x 7.5 cm pseudoaneurysm arising from the left aspect of a fem-fem bypass graft in the left inguinal region. Finding suggestive of graft failure. Superimposed infection not excluded. 2. Extensive atherosclerotic calcifications throughout the intra-abdominal aorta and its branch vessels. 3. 6 mm calculus at the right UVJ with secondary mild right hydroureteronephrosis. Additional nonobstructive right renal nephrolithiasis as above. 4. Large volume stool within the rectal vault, suggesting constipation. Critical Value/emergent  results were called by telephone at the time of interpretation on 08/29/2016 at 11:29 pm to Dr. Gareth Morgan , who verbally acknowledged these results. Electronically Signed   By: Jeannine Boga M.D.   On: 08/29/2016 23:34     CBC  Recent Labs Lab 08/29/16 1930 08/31/16 0350 08/31/16 1840 09/01/16 0542  WBC 8.3 12.2*  --  12.0*  HGB 13.0 7.3* 9.6* 8.7*  HCT 38.7 22.3* 29.4* 25.9*  PLT 179 137*  --  115*  MCV 79.5 80.2  --  83.5  MCH 26.7 26.3  --  28.1  MCHC 33.6 32.7  --  33.6  RDW 15.8* 16.2*  --  16.3*  LYMPHSABS 1.7  --   --   --   MONOABS 0.8  --   --   --   EOSABS 0.2  --   --   --   BASOSABS 0.0  --   --   --     Chemistries   Recent Labs Lab 08/29/16 1930 08/31/16 0151 09/01/16 0542  NA  140 138 137  K 4.2 4.6 4.0  CL 105 108 110  CO2 26 21* 22  GLUCOSE 132* 192* 134*  BUN 28* 27* 30*  CREATININE 1.20* 1.59* 1.44*  CALCIUM 9.8 8.6* 8.8*  AST 18  --   --   ALT 15  --   --   ALKPHOS 90  --   --   BILITOT 0.7  --   --    ------------------------------------------------------------------------------------------------------------------ estimated creatinine clearance is 29.9 mL/min (A) (by C-G formula based on SCr of 1.44 mg/dL (H)). ------------------------------------------------------------------------------------------------------------------  Recent Labs  08/30/16 1917  HGBA1C 6.6*   ------------------------------------------------------------------------------------------------------------------ No results for input(s): CHOL, HDL, LDLCALC, TRIG, CHOLHDL, LDLDIRECT in the last 72 hours. ------------------------------------------------------------------------------------------------------------------ No results for input(s): TSH, T4TOTAL, T3FREE, THYROIDAB in the last 72 hours.  Invalid input(s): FREET3 ------------------------------------------------------------------------------------------------------------------ No results for input(s):  VITAMINB12, FOLATE, FERRITIN, TIBC, IRON, RETICCTPCT in the last 72 hours.  Coagulation profile  Recent Labs Lab 08/30/16 0003  INR 1.01    No results for input(s): DDIMER in the last 72 hours.  Cardiac Enzymes No results for input(s): CKMB, TROPONINI, MYOGLOBIN in the last 168 hours.  Invalid input(s): CK ------------------------------------------------------------------------------------------------------------------ Invalid input(s): POCBNP   CBG:  Recent Labs Lab 08/31/16 1643 08/31/16 1714 08/31/16 2015 09/01/16 0005 09/01/16 0451  GLUCAP 61* 90 232* 178* 134*       Studies: No results found.    Lab Results  Component Value Date   HGBA1C 6.6 (H) 08/30/2016   HGBA1C 6.9 05/16/2016   HGBA1C 5.6 09/14/2015   Lab Results  Component Value Date   MICROALBUR <1.2 11/10/2015   LDLCALC 41 11/10/2015   CREATININE 1.44 (H) 09/01/2016       Scheduled Meds: . allopurinol  100 mg Oral Daily  . amLODipine  5 mg Oral Daily  . aspirin  81 mg Oral Daily  . atorvastatin  10 mg Oral QHS  . chlorhexidine  15 mL Mouth Rinse BID  . docusate sodium  100 mg Oral Daily  . donepezil  10 mg Oral QHS  . feeding supplement (PRO-STAT SUGAR FREE 64)  30 mL Oral BID  . insulin aspart  0-9 Units Subcutaneous TID WC  . levETIRAcetam  250 mg Oral BID  . mouth rinse  15 mL Mouth Rinse q12n4p  . metoprolol tartrate  25 mg Oral QHS  . metoprolol tartrate  50 mg Oral Daily  . mirtazapine  7.5 mg Oral QHS  . pantoprazole  40 mg Oral Daily  . sertraline  50 mg Oral Daily   Continuous Infusions: . sodium chloride    . ceFEPime (MAXIPIME) IV Stopped (08/31/16 1342)  . magnesium sulfate 1 - 4 g bolus IVPB    . vancomycin Stopped (08/31/16 1932)     LOS: 2 days    Time spent: >30 MINS    OSEI-BONSU,Cierra Rothgeb  [facility] Pager [phone]. If 7PM-7AM, please contact night-coverage at [email or web address], password [facility] 09/01/2016, 10:42 AM  LOS: 2 days

## 2016-09-01 NOTE — Progress Notes (Addendum)
  Progress Note  SUBJECTIVE:    POD #2  Was sleeping upon arrival. Having pain when left VAC removed.   OBJECTIVE:   Vitals:   09/01/16 0034 09/01/16 0446  BP: (!) 131/56 (!) 124/59  Pulse: 86 85  Resp: (!) 24 (!) 23  Temp: 99.2 F (37.3 C) 99.5 F (37.5 C)    Intake/Output Summary (Last 24 hours) at 09/01/16 0914 Last data filed at 09/01/16 0751  Gross per 24 hour  Intake          1861.49 ml  Output              495 ml  Net          1366.49 ml   Left groin staples intact. No hematoma. JP drain removed with mild oozing of old blood.  Palpable fem-fem graft pulse. Brisk PT signals bilaterally.   ASSESSMENT/PLAN:   76 y.o. female is s/p:  repair left femoral pseudoaneurysm, thrombectomy left fem-pop bypass graft and fem-fem bypass graft Revision of fem-fem bypass, revision right to left fem-fem bypass graft 2 Days Post-Op   Bypass grafts patent. VAC removed and JP drain removed. Doppler signals left foot improved today. H/H improved after 2 units prbcs yesterday. Creatinine trending down. UOP remaining stable. Follow labs in am.  Continue abx until wound cultures are back.   Alvia Grove 09/01/2016 9:14 AM -- LABS:   CBC    Component Value Date/Time   WBC 12.0 (H) 09/01/2016 0542   HGB 8.7 (L) 09/01/2016 0542   HCT 25.9 (L) 09/01/2016 0542   PLT 115 (L) 09/01/2016 0542    BMET    Component Value Date/Time   NA 137 09/01/2016 0542   NA 142 05/16/2016   K 4.0 09/01/2016 0542   CL 110 09/01/2016 0542   CO2 22 09/01/2016 0542   GLUCOSE 134 (H) 09/01/2016 0542   BUN 30 (H) 09/01/2016 0542   BUN 32 (A) 05/16/2016   CREATININE 1.44 (H) 09/01/2016 0542   CALCIUM 8.8 (L) 09/01/2016 0542   GFRNONAA 34 (L) 09/01/2016 0542   GFRAA 40 (L) 09/01/2016 0542    COAG Lab Results  Component Value Date   INR 1.01 08/30/2016   INR 1.59 (H) 02/07/2013   INR 1.38 02/06/2013   No results found for: PTT  ANTIBIOTICS:   Anti-infectives    Start     Dose/Rate  Route Frequency Ordered Stop   08/31/16 1800  vancomycin (VANCOCIN) 500 mg in sodium chloride 0.9 % 100 mL IVPB     500 mg 100 mL/hr over 60 Minutes Intravenous Every 24 hours 08/31/16 1111     08/30/16 1345  vancomycin (VANCOCIN) IVPB 750 mg/150 ml premix  Status:  Discontinued     750 mg 150 mL/hr over 60 Minutes Intravenous Every 24 hours 08/30/16 1334 08/31/16 1111   08/30/16 1345  ceFEPIme (MAXIPIME) 1 g in dextrose 5 % 50 mL IVPB     1 g 100 mL/hr over 30 Minutes Intravenous Every 24 hours 08/30/16 [place], PA-C [facility] Office: [phone] Pager: [phone] 09/01/2016 9:14 AM  I have interviewed the patient and examined the patient. I agree with the findings by the PA. Feet warm. Drain is out. Incision fine.   Gae Gallop, MD [phone]

## 2016-09-02 LAB — GLUCOSE, CAPILLARY
GLUCOSE-CAPILLARY: 137 mg/dL — AB (ref 65–99)
GLUCOSE-CAPILLARY: 159 mg/dL — AB (ref 65–99)
Glucose-Capillary: 128 mg/dL — ABNORMAL HIGH (ref 65–99)

## 2016-09-02 LAB — BASIC METABOLIC PANEL
Anion gap: 6 (ref 5–15)
BUN: 27 mg/dL — AB (ref 6–20)
CALCIUM: 9 mg/dL (ref 8.9–10.3)
CHLORIDE: 106 mmol/L (ref 101–111)
CO2: 23 mmol/L (ref 22–32)
CREATININE: 1.28 mg/dL — AB (ref 0.44–1.00)
GFR, EST AFRICAN AMERICAN: 46 mL/min — AB (ref >60)
GFR, EST NON AFRICAN AMERICAN: 40 mL/min — AB (ref >60)
Glucose, Bld: 151 mg/dL — ABNORMAL HIGH (ref 65–99)
Potassium: 3.8 mmol/L (ref 3.5–5.1)
SODIUM: 135 mmol/L (ref 135–145)

## 2016-09-02 MED ORDER — PANTOPRAZOLE SODIUM 40 MG PO TBEC: 40.0000 mg | DELAYED_RELEASE_TABLET | Freq: Every day | ORAL | 0 refills | Status: DC

## 2016-09-02 MED ORDER — GUAIFENESIN-DM 100-10 MG/5ML PO SYRP: 15.0000 mL | ORAL_SOLUTION | ORAL | 0 refills | Status: DC | PRN

## 2016-09-02 MED ORDER — HYDRALAZINE HCL 25 MG PO TABS: 25.0000 mg | ORAL_TABLET | Freq: Four times a day (QID) | ORAL | 0 refills | Status: DC | PRN

## 2016-09-02 MED ORDER — ACETAMINOPHEN 325 MG PO TABS: 650.0000 mg | ORAL_TABLET | Freq: Four times a day (QID) | ORAL | 0 refills | Status: DC | PRN

## 2016-09-02 MED ORDER — AMOXICILLIN-POT CLAVULANATE 500-125 MG PO TABS
1.0000 | ORAL_TABLET | Freq: Two times a day (BID) | ORAL | Status: DC
  Filled 2016-09-02: qty 1

## 2016-09-02 MED ORDER — POLYETHYLENE GLYCOL 3350 17 G PO PACK
17.0000 g | PACK | Freq: Every day | ORAL | Status: DC
  Administered 2016-09-02: 17 g via ORAL
  Filled 2016-09-02: qty 1

## 2016-09-02 MED ORDER — DIPHENHYDRAMINE HCL 25 MG PO CAPS: 25.0000 mg | ORAL_CAPSULE | Freq: Four times a day (QID) | ORAL | Status: DC | PRN

## 2016-09-02 MED ORDER — LEVOFLOXACIN 250 MG PO TABS: 250.0000 mg | ORAL_TABLET | Freq: Every day | ORAL | 0 refills | Status: DC

## 2016-09-02 MED ORDER — ORAL CARE MOUTH RINSE: 15.0000 mL | Freq: Two times a day (BID) | OROMUCOSAL | 1 refills | Status: DC

## 2016-09-02 NOTE — Progress Notes (Addendum)
  Progress Note  SUBJECTIVE:    POD #3  No complaints this am.   OBJECTIVE:   Vitals:   09/02/16 0551 09/02/16 0800  BP:    Pulse: 79   Resp: (!) 23   Temp:  98.5 F (36.9 C)    Intake/Output Summary (Last 24 hours) at 09/02/16 0910 Last data filed at 09/02/16 0523  Gross per 24 hour  Intake              480 ml  Output             1500 ml  Net            -1020 ml   Left groin incision with staples intact.  Mild serosanguinous oozing from drain exit site left groin.  Palpable fem-fem graft pulse.  Brisk left DP and PT signals and brisk right DP signal  ASSESSMENT/PLAN:   76 y.o. female is s/p: repair left femoral pseudoaneurysm, thrombectomy left fem-pop bypass graft and fem-fem bypass graft Revision of fem-fem bypass, revision right to left fem-fem bypass graft 3 Days Post-Op   Incision healing well.  Feet well perfused. Wound cultures still pending. Continue abx.   Alvia Grove 09/02/2016 9:10 AM -- George Hugh with attending physician. Stable from vascular standpoint for d/c to SNF. Wound cultures negative to date. Would be ok to d/c with broad spectrum po abx x 1 week  Virgina Jock, PA-C 11:16 AM  LABS:   CBC    Component Value Date/Time   WBC 12.0 (H) 09/01/2016 0542   HGB 8.7 (L) 09/01/2016 0542   HCT 25.9 (L) 09/01/2016 0542   PLT 115 (L) 09/01/2016 0542    BMET    Component Value Date/Time   NA 135 09/02/2016 0222   NA 142 05/16/2016   K 3.8 09/02/2016 0222   CL 106 09/02/2016 0222   CO2 23 09/02/2016 0222   GLUCOSE 151 (H) 09/02/2016 0222   BUN 27 (H) 09/02/2016 0222   BUN 32 (A) 05/16/2016   CREATININE 1.28 (H) 09/02/2016 0222   CALCIUM 9.0 09/02/2016 0222   GFRNONAA 40 (L) 09/02/2016 0222   GFRAA 46 (L) 09/02/2016 0222    COAG Lab Results  Component Value Date   INR 1.01 08/30/2016   INR 1.59 (H) 02/07/2013   INR 1.38 02/06/2013   No results found for: PTT  ANTIBIOTICS:   Anti-infectives    Start      Dose/Rate Route Frequency Ordered Stop   08/31/16 1800  vancomycin (VANCOCIN) 500 mg in sodium chloride 0.9 % 100 mL IVPB     500 mg 100 mL/hr over 60 Minutes Intravenous Every 24 hours 08/31/16 1111     08/30/16 1345  vancomycin (VANCOCIN) IVPB 750 mg/150 ml premix  Status:  Discontinued     750 mg 150 mL/hr over 60 Minutes Intravenous Every 24 hours 08/30/16 1334 08/31/16 1111   08/30/16 1345  ceFEPIme (MAXIPIME) 1 g in dextrose 5 % 50 mL IVPB     1 g 100 mL/hr over 30 Minutes Intravenous Every 24 hours 08/30/16 [place], PA-C [facility] Office: [phone] Pager: [phone] 09/02/2016 9:10 AM  I have interviewed the patient and examined the patient. I agree with the findings by the PA. Good Doppler signals left foot. Left groin incision looks good. Drain is out.  Gae Gallop, MD [phone]

## 2016-09-02 NOTE — Progress Notes (Signed)
  Speech Language Pathology Treatment: Dysphagia  Patient Details Name: Casey [name] MRN: [id number] DOB: 30-Apr-1940 Today's Date: 09/02/2016 Time: 1212-1224 SLP Time Calculation (min) (ACUTE ONLY): 12 min  Assessment / Plan / Recommendation Clinical Impression  Patient seen for dysphagia treatment to assess appropriateness for upgrade to solids. She continues to have belching with liquids, though not as frequent and with no apparent regurgitation. Provided upgraded texture trials of pureed, regular solids; no overt signs of aspiration, adequate oral clearance, intermittent belching. Belching appears more frequently with liquids (particularly ice cold liquids via straw). Recommend upgrade to dys 3 (soft) diet given dentition, with thin liquids. No restrictions on straws, though pt may be more comfortable drinking via cup sips, room temp vs ice cold liquids. SLP to f/u for tolerance.    HPI HPI: Casey Koppenhaver Kittrellis a 76 y.o.femalewith a past medical history significant for dementia, pAF not on anticoag, CVA with R hemiparesis, CHF EF 35%, HTN, PVD s/p fem-fem bypass, seizures and legally blindwho presents with left groin bulge. s/p: repair left femoral pseudoaneurysm, thrombectomy left fem-pop bypass graft and fem-fem bypass graft      SLP Plan  Continue with current plan of care       Recommendations  Diet recommendations: Dysphagia 3 (mechanical soft);Thin liquid Liquids provided via: Cup Medication Administration: Whole meds with liquid Supervision: Patient able to self feed;Staff to assist with self feeding Compensations: Slow rate;Small sips/bites Postural Changes and/or Swallow Maneuvers: Upright 30-60 min after meal;Seated upright 90 degrees                Oral Care Recommendations: Oral care BID Follow up Recommendations: Skilled Nursing facility SLP Visit Diagnosis: Dysphagia, pharyngoesophageal phase (R13.14) Plan: Continue with current plan of care        [place], [place], [place] Pathologist [phone]  Aliene Altes 09/02/2016, 12:47 PM

## 2016-09-02 NOTE — Progress Notes (Signed)
Clinical Social Worker facilitated patient discharge including contacting patient family and facility to confirm patient discharge plans.  Clinical information faxed to facility and family agreeable with plan.  CSW arranged ambulance transport via [facility] to [facility] .  RN to call [phone] (pt will go in room 106A) for report prior to discharge.  Clinical Social Worker will sign off for now as social work intervention is no longer needed. Please consult [place] again if new need arises.  Rhea Pink, MSW, [place]

## 2016-09-02 NOTE — Discharge Summary (Addendum)
Patient ID: KAELA BEITZ MRN: [id number] DOB/AGE: [birth year] 76 y.o.  Admit date: 08/29/2016 Discharge date: 09/02/2016  Primary Care Physician:  Gildardo Cranker, DO  Discharge Diagnoses:   Present on Admission: . Pseudoaneurysm of femoral artery ([place]) . Atherosclerosis of native artery of extremity with intermittent claudication ([place]) . Atrial fibrillation ([place]) . Chronic systolic congestive heart failure ([place]) . CKD stage 3 due to type 2 diabetes mellitus ([place]) . Dementia without behavioral disturbance . Hypertensive heart disease with congestive heart failure ([place]) . Legally blind . Type 2 diabetes mellitus with diabetic neuropathy ([place]) . Vascular dementia without behavioral disturbance  Jonnell A Kittrellis a 76 y.o.femalewith a past medical history significant for dementia, pAF not on anticoag, CVA with R hemiparesis, CHF EF 35%, HTN, PVD s/p fem-fem bypass, seizures and legally blindwho presents with left groin bulge. CT of the abdomen and pelvis with contrast showed a mildly obstructed 6 mm stone at the right UVJ, also a 7.5 cm left femoralpseudoaneurysm - patient transferred to [facility] and evaluated by Dr. Trula Slade , status post aneurysm repair Consults:  Vascular surgery, Dr Trula Slade  Recommendations for Outpatient Follow-up:  1. Groin postsurgical wound checks 2. Please repeat CBC/BMET at next visit 3. Please follow wound cultures till final Check wound cultures, report   DIET: Dysphagia diet 3    Allergies:   Allergies  Allergen Reactions  . Penicillins Other (See Comments)    Patient states that she was told previously by a doctor to not take this medication but is unsure why.      DISCHARGE MEDICATIONS: Current Discharge Medication List    START taking these medications   Details  acetaminophen (TYLENOL) 325 MG tablet Take 2 tablets (650 mg total) by mouth every 6 (six) hours as needed for mild pain (or Fever >/= 101). Qty: 30 tablet, Refills: 0     guaiFENesin-dextromethorphan (ROBITUSSIN DM) 100-10 MG/5ML syrup Take 15 mLs by mouth every 4 (four) hours as needed for cough. Qty: 118 mL, Refills: 0    hydrALAZINE (APRESOLINE) 25 MG tablet Take 1 tablet (25 mg total) by mouth every 6 (six) hours as needed (SBP > 170). Qty: 90 tablet, Refills: 0    levofloxacin (LEVAQUIN) 250 MG tablet Take 1 tablet (250 mg total) by mouth daily. Qty: 7 tablet, Refills: 0    mouth rinse LIQD solution 15 mLs by Mouth Rinse route 2 times daily at 12 noon and 4 pm. Qty: 600 mL, Refills: 1    pantoprazole (PROTONIX) 40 MG tablet Take 1 tablet (40 mg total) by mouth daily. Qty: 30 tablet, Refills: 0      CONTINUE these medications which have NOT CHANGED   Details  allopurinol (ZYLOPRIM) 100 MG tablet Take 100 mg by mouth daily.    Amino Acids-Protein Hydrolys (FEEDING SUPPLEMENT, PRO-STAT SUGAR FREE 64,) LIQD Take 30 mLs by mouth 2 (two) times daily.     amLODipine (NORVASC) 2.5 MG tablet Take 2.5 mg by mouth daily.    aspirin 81 MG tablet Take 81 mg by mouth daily.    atorvastatin (LIPITOR) 10 MG tablet Take 10 mg by mouth at bedtime.     donepezil (ARICEPT) 10 MG tablet Take 1 tablet (10 mg total) by mouth at bedtime.    levETIRAcetam (KEPPRA) 250 MG tablet Take 250 mg by mouth 2 (two) times daily.    metoprolol tartrate (LOPRESSOR) 25 MG tablet Take 50 mg by mouth every morning. And 25mg  at night. Hold if pulse  < 60  mirtazapine (REMERON) 7.5 MG tablet Take 7.5 mg by mouth at bedtime.    Nutritional Supplements (NUTRITIONAL SUPPLEMENT PO) Give by mouth House Shakes 4 oz 3 times daily    sertraline (ZOLOFT) 50 MG tablet Take 50 mg by mouth daily.       STOP taking these medications     enalapril (VASOTEC) 5 MG tablet      UNABLE TO FIND          Brief H and P: For complete details please refer to admission H and P, but in brief Nikhita A Gohlke is a 76 y.o. female with a past medical history significant for dementia, pAF  not on anticoag, CVA with R hemiparesis, CHF EF 35%, HTN, PVD s/p fem-fem bypass, seizures and legally blind who presents with left groin bulge.  Caveat that patient is demented and does not provide meaningful history (states she lives with her daughter, can only say that she came here in an ambulance, doesn't know why).  Per report, the patient has been in her normal state of health (WC bound, pleasant and interactive, but moderate dementia and hemiparesis).  Tonight, staff at her facility noticed a painful mass in her left groin/lower abdomen and sent her to the ER.  She had had no other focal complaints, no fever, no urinary symptoms, and no prior complaints of pain or groin discomfort.  ED course: -Afebrile, heart rate 98, respirations 25, blood pressure 179/103, pulse ox normal -Na 140, K 4.2, Cr 1.2 (baseline 1.2), WBC 8.3K, Hgb 13 -Urinalysis showed bacteria, no WBCs -CT of the abdomen and pelvis with contrast showed a mildly obstructed 6 mm stone at the right UVJ, also a 7.5 cm left femoral pseudoaneurysm -This was discussed with Dr. Trula Slade of vascular surgery who recommended transfer to [facility] for surgical evaluation and management     Hospital Course:  Principal Problem:   Pseudoaneurysm of femoral artery ([place]) Active Problems:   Atrial fibrillation ([place])   Type 2 diabetes mellitus with diabetic neuropathy ([place])   Atherosclerosis of native artery of extremity with intermittent claudication ([place])   Dementia without behavioral disturbance   Chronic systolic congestive heart failure ([place])   Seizures ([place])   Legally blind   CKD stage 3 due to type 2 diabetes mellitus ([place])   Hypertensive heart disease with congestive heart failure ([place])   Vascular dementia without behavioral disturbance   History of CVA with residual deficit   1. Pseudoaneurysm of left femoral artery: s/p: repair left femoral pseudoaneurysm, thrombectomy left fem-pop bypass graft and fem-fem bypass  graft, Revision of fem-fem bypass, revision right to left fem-fem bypass graft, 1 Day Post-Op  Deemed to have moderate to high CV risk perioperatively .  Postop anemia noted Hemoglobin 7.3 Continue empiric abx until wound cultures are back.   2. ABLA Hemoglobin drop from 13 >7.3  transfused 2 units of packed red blood cells   2. Nephrolith with mild obstruction: Asymptomatic. This is an incidental finding. -Monitor for fever Urine culture shows multiple morphologies  3. Chronic systolic CHF: most recent echo 02/03/13 EF 35%. Currently appears euvolemic. -I/Os, daily weights  4. Dementia: -Continue donepezil, sertraline, mirtazapine  5. Paroxysmal atrial fibrillation: CHADS2Vasc 8,  not on AC - continue aspirin Rate control  6. Hypertension: Hypertensive at admission -Increase metoprolol and amlodipine doses -IV hydralazine for severe high BP -Restart enalaril  Prior to dc if renal fn stab;e   7. Seizures: -Continue Keppra  8. Other medications: -Continue allopurinol  9.  Legally blind:  10. AKI, CKD stage III: baseline, cr1-1.2. Patient received contrast with CT 08/31/16 Gentle hydration with clinical follow-up    Day of Discharge She was seen on rounds. Patient discussed with vascular surgery team and cleared for discharge to be continued on respiratory antibiotic for 7 days  BP (!) 133/59 (BP Location: Right Arm)   Pulse 79   Temp 98.5 F (36.9 C) (Oral)   Resp (!) 23   Ht 5\' 5"  (1.651 m)   Wt 61.8 kg (136 lb 3.9 oz)   SpO2 99%   BMI 22.67 kg/m   Physical Exam: General exam: Appears calm and comfortable  Respiratory system: Clear to auscultation. Respiratory effort normal. Cardiovascular system: S1 & S2 heard, RRR. No JVD, murmurs, rubs, gallops or clicks. No pedal edema. Gastrointestinal system: Abdomen is nondistended, soft and nontender. No organomegaly or masses felt. Normal bowel sounds heard. Central nervous system: Alert and  oriented. No focal neurological deficits. Extremities: Symmetric 5 x 5 power. Skin: No rashes, lesions or ulcers Psychiatry: Judgement and insight appear normal. Mood & affect appropriate.    The results of significant diagnostics from this hospitalization (including imaging, microbiology, ancillary and laboratory) are listed below for reference.    LAB RESULTS: Basic Metabolic Panel:  Recent Labs Lab 09/01/16 0542 09/02/16 0222  NA 137 135  K 4.0 3.8  CL 110 106  CO2 22 23  GLUCOSE 134* 151*  BUN 30* 27*  CREATININE 1.44* 1.28*  CALCIUM 8.8* 9.0   Liver Function Tests:  Recent Labs Lab 08/29/16 1930  AST 18  ALT 15  ALKPHOS 90  BILITOT 0.7  PROT 7.4  ALBUMIN 3.6   No results for input(s): LIPASE, AMYLASE in the last 168 hours. No results for input(s): AMMONIA in the last 168 hours. CBC:  Recent Labs Lab 08/29/16 1930 08/31/16 0350 08/31/16 1840 09/01/16 0542  WBC 8.3 12.2*  --  12.0*  NEUTROABS 5.6  --   --   --   HGB 13.0 7.3* 9.6* 8.7*  HCT 38.7 22.3* 29.4* 25.9*  MCV 79.5 80.2  --  83.5  PLT 179 137*  --  115*   Cardiac Enzymes: No results for input(s): CKTOTAL, CKMB, CKMBINDEX, TROPONINI in the last 168 hours. BNP: Invalid input(s): POCBNP CBG:  Recent Labs Lab 09/02/16 0644 09/02/16 1127  GLUCAP 137* 128*    Significant Diagnostic Studies:  Ct Abdomen Pelvis W Contrast  Result Date: 08/29/2016 CLINICAL DATA:  Initial evaluation for acute abdominal pain, left rolling mass. EXAM: CT ABDOMEN AND PELVIS WITH CONTRAST TECHNIQUE: Multidetector CT imaging of the abdomen and pelvis was performed using the standard protocol following bolus administration of intravenous contrast. CONTRAST:  137mL ISOVUE-300 IOPAMIDOL (ISOVUE-300) INJECTION 61% COMPARISON:  Prior CT from 12/04/2011. FINDINGS: Lower chest: Scattered atelectatic changes noted within the visualized lung bases. Cardiomegaly partially visualized. No pleural or pericardial effusion.  Hepatobiliary: Liver demonstrates a normal contrast enhanced appearance. Gallbladder within normal limits. No biliary dilatation. Pancreas: Pancreas somewhat atrophic but otherwise unremarkable without acute inflammation or mass lesion. Spleen: Subcentimeter hypodensity noted within central aspect of the spleen, of doubtful significance. Spleen otherwise unremarkable. Adrenals/Urinary Tract: Adrenal glands within normal limits. Kidneys relatively equal in size with symmetric enhancement. Scattered cortical thinning and scarring. 5.6 cm cyst noted extending from the lower pole left kidney. Additional scattered subcentimeter hypodensities within the bilateral kidneys too small the characterize, but statistically likely reflects small cysts as well. Multiple nonobstructive calculi present within the right kidney, largest of  which positioned within the lower pole and measures approximately 15 mm. Mild right hydroureteronephrosis. There is an obstructive 6 mm calculus at the right UVJ (series 2, image 64). No left-sided renal calculi. No left-sided hydronephrosis or hydroureter. Bladder largely decompressed. Mild circumferential bladder wall thickening like related incomplete distension. Stomach/Bowel: Stomach within normal limits. No evidence for bowel obstruction. Appendix within normal limits. No acute inflammatory changes seen about the bowels. Large volume stool seen packed within the rectal vault, suggesting constipation. Vascular/Lymphatic: Extensive atherosclerosis throughout the intra-abdominal aorta and its branch vessels. Origin of the celiac axis, SMA, renal arteries, and IMA are grossly patent. Occlusion of the left common iliac just distal to its origin (series 2, image 42). Left internal iliac occluded proximally, with distal reconstitution likely via collateralization. Occluded vascular stent in place within the left external iliac artery. Extensive atherosclerotic disease throughout the right iliac  system. Fem-fem bypass graft in place. That right common femoral anastomosis appears patent. Large pseudoaneurysm measuring 4.4 x 5.7 x 7.5 cm arises from the left common femoral anastomosis in the left inguinal region (series 2, image 63). Opacification of the left common femoral artery distally. Reproductive: Scattered calcified uterine fibroids noted. Uterus and ovaries otherwise within normal limits. Other: No free air or fluid. Musculoskeletal: Small intramuscular lipoma noted within the left abdominus musculature. No acute osseous abnormality. No worrisome lytic or blastic osseous lesions. IMPRESSION: 1. 4.4 x 5.7 x 7.5 cm pseudoaneurysm arising from the left aspect of a fem-fem bypass graft in the left inguinal region. Finding suggestive of graft failure. Superimposed infection not excluded. 2. Extensive atherosclerotic calcifications throughout the intra-abdominal aorta and its branch vessels. 3. 6 mm calculus at the right UVJ with secondary mild right hydroureteronephrosis. Additional nonobstructive right renal nephrolithiasis as above. 4. Large volume stool within the rectal vault, suggesting constipation. Critical Value/emergent results were called by telephone at the time of interpretation on 08/29/2016 at 11:29 pm to Dr. Gareth Morgan , who verbally acknowledged these results. Electronically Signed   By: Jeannine Boga M.D.   On: 08/29/2016 23:34    2D ECHO:   Disposition and Follow-up: Discharge Instructions    Call MD for:  difficulty breathing, headache or visual disturbances    Complete by:  As directed    Call MD for:  extreme fatigue    Complete by:  As directed    Call MD for:  hives    Complete by:  As directed    Call MD for:  persistant dizziness or light-headedness    Complete by:  As directed    Call MD for:  persistant nausea and vomiting    Complete by:  As directed    Call MD for:  redness, tenderness, or signs of infection (pain, swelling, redness, odor or  green/yellow discharge around incision site)    Complete by:  As directed    Call MD for:  severe uncontrolled pain    Complete by:  As directed    Call MD for:  temperature >100.4    Complete by:  As directed    Diet - low sodium heart healthy    Complete by:  As directed    Increase activity slowly    Complete by:  As directed        DISPOSITION:SNF   DISCHARGE FOLLOW-UP Follow-up Information    Serafina Mitchell, MD In 2 weeks.   Specialties:  Vascular Surgery, Cardiology Why:  Our office will call you to arrange an appointment  Contact  information: [address] [place] [address] [phone]            Time spent on Discharge: 35 minutes  Signed:   OSEI-BONSU,Leita Lindbloom M.D. [facility] 09/02/2016, 2:55 PM PAGER:[phone]

## 2016-09-03 ENCOUNTER — Telehealth: Payer: Self-pay | Admitting: Surgery

## 2016-09-03 ENCOUNTER — Encounter: Payer: Self-pay | Admitting: Adult Health

## 2016-09-03 ENCOUNTER — Non-Acute Institutional Stay (SKILLED_NURSING_FACILITY): Payer: Medicare Other | Admitting: Adult Health

## 2016-09-03 DIAGNOSIS — E785 Hyperlipidemia, unspecified: Secondary | ICD-10-CM

## 2016-09-03 DIAGNOSIS — E1169 Type 2 diabetes mellitus with other specified complication: Secondary | ICD-10-CM | POA: Diagnosis not present

## 2016-09-03 DIAGNOSIS — E1122 Type 2 diabetes mellitus with diabetic chronic kidney disease: Secondary | ICD-10-CM

## 2016-09-03 DIAGNOSIS — I70213 Atherosclerosis of native arteries of extremities with intermittent claudication, bilateral legs: Secondary | ICD-10-CM

## 2016-09-03 DIAGNOSIS — N183 Chronic kidney disease, stage 3 unspecified: Secondary | ICD-10-CM

## 2016-09-03 DIAGNOSIS — M109 Gout, unspecified: Secondary | ICD-10-CM

## 2016-09-03 DIAGNOSIS — R569 Unspecified convulsions: Secondary | ICD-10-CM | POA: Diagnosis not present

## 2016-09-03 DIAGNOSIS — F0150 Vascular dementia without behavioral disturbance: Secondary | ICD-10-CM | POA: Diagnosis not present

## 2016-09-03 DIAGNOSIS — I6990 Unspecified sequelae of unspecified cerebrovascular disease: Secondary | ICD-10-CM

## 2016-09-03 DIAGNOSIS — T82868D Thrombosis of vascular prosthetic devices, implants and grafts, subsequent encounter: Secondary | ICD-10-CM

## 2016-09-03 DIAGNOSIS — E1140 Type 2 diabetes mellitus with diabetic neuropathy, unspecified: Secondary | ICD-10-CM

## 2016-09-03 DIAGNOSIS — T82868A Thrombosis of vascular prosthetic devices, implants and grafts, initial encounter: Secondary | ICD-10-CM | POA: Insufficient documentation

## 2016-09-03 NOTE — Telephone Encounter (Signed)
-----   Message from Mena Goes, RN sent at 09/02/2016  2:52 PM EDT ----- Regarding: 2 weeks   ----- Message ----- From: Ansel Bong Sent: 09/02/2016  11:18 AM To: Vvs Charge Pool  S/p repair left femoral pseudoaneurysm, thrombectomy left fem-pop bypass graft and fem-fem bypass graft Revision of fem-fem bypass, revision right to left fem-fem bypass graft  08/30/16  F/u with [facility] in 2 weeks  Thanks Maudie Mercury

## 2016-09-03 NOTE — Progress Notes (Signed)
Location:   [place] Room Number: 106 A Place of Service:  SNF (31)   CODE STATUS: Full Code  Allergies  Allergen Reactions  . Penicillins Other (See Comments)    Patient states that she was told previously by a doctor to not take this medication but is unsure why.     Chief Complaint  Patient presents with  . 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  She is unable to participate with the hpi or ros due to her dementia. She told me that she is not feeling good. There are no nursing concerns at this time.    Past Medical History:  Diagnosis Date  . Abdominal or pelvic swelling, mass, or lump, left upper quadrant   . CHF (congestive heart failure) ([place])   . Chronic atrial fibrillation ([place])   . Chronic kidney disease   . Congestive heart failure, unspecified   . Coronary atherosclerosis of unspecified type of vessel, native or graft   . Diabetes mellitus   . Edema   . GERD (gastroesophageal reflux disease)   . Gout, unspecified   . Hypercholesterolemia   . Hypertension   . 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  . Peripheral vascular disease, unspecified ([place])   . Seizures ([place])   . Sinus of Valsalva aneurysm    a. By 2D echo 05/2011.  . Stroke ([place])   . Unspecified hereditary and idiopathic peripheral neuropathy   . Unspecified urinary incontinence   . Vascular dementia, uncomplicated     Past Surgical History:  Procedure Laterality Date  . AORTOGRAM  09/01/2008   Abd w/ bilateral lower extremity runoff arteriography  . APPLICATION OF WOUND VAC Left  08/30/2016   Procedure: APPLICATION OF WOUND VAC LEFT GROIN;  Surgeon: Serafina Mitchell, MD;  Location: [facility];  Service: Vascular;  Laterality: Left;  . ENDARTERECTOMY FEMORAL Left 08/30/2016   Procedure: LEFT COMMON FEMORAL ARTERY ENDARTERECTOMY;  Surgeon: Serafina Mitchell, MD;  Location: [facility];  Service: Vascular;  Laterality: Left;  . FALSE ANEURYSM REPAIR  12/06/2011   Procedure: REPAIR FALSE ANEURYSM;  Surgeon: Angelia Mould, MD;  Location: [facility] OR;  Service: Vascular;  Laterality: Left;  Repair of left femoral Artery pseudoaneurysm  . FALSE ANEURYSM REPAIR Left 08/30/2016   Procedure: REPAIR PSEUDOANEURYSM LEFT GROIN;  Surgeon: Serafina Mitchell, MD;  Location: [facility];  Service: Vascular;  Laterality: Left;  . FEMORAL ARTERY EXPLORATION  12/06/2011   Procedure: FEMORAL ARTERY EXPLORATION;  Surgeon: Angelia Mould, MD;  Location: [facility] OR;  Service: Vascular;  Laterality: N/A;  Exploration of large pseudoaneurysm left side of fem-fem bypass graft   . femoral to femoral bypass graft     . FEMORAL-FEMORAL BYPASS GRAFT  12/06/2011   Procedure: BYPASS GRAFT FEMORAL-FEMORAL ARTERY;  Surgeon: Angelia Mould, MD;  Location: [facility] OR;  Service: Vascular;  Laterality: Bilateral;  Revision of left to right Femoral-Femoral bypass graft  . FEMORAL-FEMORAL BYPASS GRAFT Left 08/30/2016   Procedure: REDO RIGHT TO LEFT FEMORAL-FEMORAL ARTERY BYPASS GRAFT;  Surgeon: Serafina Mitchell, MD;  Location: [facility] OR;  Service: Vascular;  Laterality: Left;  . FEMORAL-POPLITEAL BYPASS GRAFT Left 05/2002   lower extremity femoral to below knee w/ non-versed greater saphenous vein  . FEMORAL-POPLITEAL BYPASS GRAFT Left 11/2002  . FEMORAL-POPLITEAL BYPASS GRAFT Left 08/30/2016   Procedure: REVISION OF LEFT FEMORAL-POPLITEAL ARTERY BYPASS GRAFT;  Surgeon: Serafina Mitchell, MD;  Location: [facility];  Service: Vascular;  Laterality: Left;  . THROMBECTOMY FEMORAL ARTERY Left 08/30/2016   Procedure: THROMBECTOMY OF FEMORAL-FEMORAL  ARTERY BYPASS GRAFT AND LEFT FEMORAL TO LEFT POPLITEAL BYPASS GRAFT;  Surgeon: Serafina Mitchell, MD;  Location: [facility];  Service: Vascular;  Laterality: Left;  . TUBAL LIGATION Bilateral     Social History   Social History  . Marital status: Widowed    Spouse name: N/A  . Number of children: N/A  . Years of education: N/A   Occupational History  . Not on file.   Social History Main Topics  . Smoking status: Former Smoker    Types: Cigarettes    Quit date: 01/29/2001  . Smokeless tobacco: Never Used  . Alcohol use No  . Drug use: No  . Sexual activity: No   Other Topics Concern  . Not on file   Social History Narrative  . No narrative on file   Family History  Problem Relation Age of Onset  . Cancer Mother        Cervical  . Stroke Mother   . Diabetes Mother   . Stroke Father   . Heart disease Father       VITAL SIGNS BP 137/69   Pulse 69   Temp 98.9 F (37.2 C)   Resp (!) 22   Ht 5' (1.524 m)   Wt 124 lb 8 oz (56.5 kg)   SpO2 97%   BMI 24.31 kg/m   Patient's Medications  New Prescriptions   No medications on file  Previous Medications   ACETAMINOPHEN (TYLENOL) 325 MG TABLET    Take 2 tablets (650 mg total) by mouth every 6 (six) hours as needed for mild pain (or Fever >/= 101).   ALLOPURINOL (ZYLOPRIM) 100 MG TABLET    Take 100 mg by mouth daily.   AMINO ACIDS-PROTEIN HYDROLYS (FEEDING SUPPLEMENT, PRO-STAT SUGAR FREE 64,) LIQD    Take 30 mLs by mouth 2 (two) times daily.    AMLODIPINE (NORVASC) 2.5 MG TABLET    Take 2.5 mg by mouth daily.   ASPIRIN 81 MG TABLET    Take 81 mg by mouth daily.   ATORVASTATIN (LIPITOR) 10 MG TABLET    Take 10 mg by mouth at bedtime.    DONEPEZIL (ARICEPT) 10 MG TABLET    Take 1 tablet (10 mg total) by mouth at bedtime.   GUAIFENESIN-DEXTROMETHORPHAN (ROBITUSSIN DM) 100-10 MG/5ML SYRUP    Take 15 mLs by mouth every 4 (four) hours as needed for cough.   HYDRALAZINE (APRESOLINE) 25 MG TABLET    Take 1 tablet (25 mg total) by  mouth every 6 (six) hours as needed (SBP > 170).   LEVETIRACETAM (KEPPRA) 250 MG TABLET    Take 250 mg by mouth 2 (two) times daily.   LEVOFLOXACIN (LEVAQUIN) 250 MG TABLET    Take 1 tablet (250 mg total) by mouth daily.   METOPROLOL TARTRATE (LOPRESSOR) 25 MG TABLET    Take 50 mg by mouth every morning. And 25mg  at night. Hold if pulse  < 60   MIRTAZAPINE (REMERON) 7.5 MG TABLET    Take  7.5 mg by mouth at bedtime.   MOUTH RINSE LIQD SOLUTION    15 mLs by Mouth Rinse route 2 times daily at 12 noon and 4 pm.   NUTRITIONAL SUPPLEMENTS (NUTRITIONAL SUPPLEMENT PO)    Give by mouth House Shakes 4 oz 3 times daily   NUTRITIONAL SUPPLEMENTS (NUTRITIONAL SUPPLEMENT PO)    House Supplement - Give 120 ml by mouth three times daily   SERTRALINE (ZOLOFT) 50 MG TABLET    Take 50 mg by mouth daily.   Modified Medications   No medications on file  Discontinued Medications   PANTOPRAZOLE (PROTONIX) 40 MG TABLET    Take 1 tablet (40 mg total) by mouth daily.     SIGNIFICANT DIAGNOSTIC EXAMS  PREVIOUS   05-21-14: ct of chest: 1. Right upper lobe 5 mm nodule unchanged. 2. Additional nodules identified, some of which are noncalcified andvalso warrants follow-up. 3. Persistent mediastinal and hilar adenopathy. This may bevlong-standing given the previous reports of adenopathy. At the [facility] next exam, consider contrast-enhanced exam for further evaluation of the lymph nodes. 4. Given patient's history of smoking, follow-up chest CT at 6-12vmonths is recommended. 5. Coronary artery disease. . 01-10-15: ct of chest; 1. No acute findings. 2. Stable lung nodules as detailed above, the largest in the right upper lobe measuring 5 mm. Recommend additional unenhanced chest CT follow-up and 18 months per Fleischner criteria for following pulmonary nodules.    TODAY   08-29-16: ct of abdomen and pelvis: 1. 4.4 x 5.7 x 7.5 cm pseudoaneurysm arising from the left aspect of a fem-fem bypass graft in the left inguinal  region. Finding suggestive of graft failure. Superimposed infection not excluded. 2. Extensive atherosclerotic calcifications throughout the intra-abdominal aorta and its branch vessels. 3. 6 mm calculus at the right UVJ with secondary mild right hydroureteronephrosis. Additional nonobstructive right renal nephrolithiasis as above. 4. Large volume stool within the rectal vault, suggesting constipation.  08-31-16: ABI:  Unable to obtain right ABI due to non-detectable waveforms. Left ABI of 0.43 is suggestive of severe arterial occlusive disease at rest. Unable to obtain bilateral TBI&'s due to low amplitude waveforms. Other specific details can be found in the table(s) above.   LABS REVIEWED: PREVIOUS     09-14-15: hgb a1c 5.6 09-20-15: glucose 85; bun 26.8; creat 1.04; k+ 4.5; na++144; mag 1.7; dig 0.68  11-10-15; wbc 6.7 hgb 12.2; hct 37.3; mcv 86.0; plt 209; gucose 83; bun 24.1; creat 1.24; k+ 5.5; na++ 144; (creat clear 31.49); liver normal albumin 3.8; chol 117; ldl 41; trig 131; hdl 50   Urine micro-albumin <1.2  02-03-16: wbc 6.5; hgb 11.8; hct 38.9;mcv 92.7; plt 171; glucose 101; bun 29.2; creat 1.14; k+ 4.5; na++ 147; ca 10.4; (creat clear 34.25); liver normal albumin 3.8  05-16-16: wbc 6.9; hgb 11.7; ht 38.3; mcv 87.8; plt 186; glucose 107; bun 32.2; creat 1.08; k+ 4.4; na++ 142; ca 9.8; hgb a1c 6.9  06-26-16: tsh 4.68; dig <0.40  TODAY: 08-29-16: wbc 8.3; hgb 13.0; hct 38.7; mcv 79.5; plt 179; glucose 132; bun 28; creat 1.20; k+ 4.2; na++ 140; ca 9.8; liver normal albumin 3.6; urine culture; multiple bacteria   08-30-16: hgb a1c 6.6 09-01-16: wbc 12.0; hgb 8.7; hct 25.9; mcv 83.5; plt 115; glucose 134; bun 30; creat 1.44; k+ 4.0; na++ 138; ca 8.8   Review of Systems  Unable to perform ROS: Dementia (unable to answer questions )       Physical Exam  Constitutional: No distress.  Frail  Eyes: Conjunctivae are normal.  Is legally blind   Neck: Neck supple. No JVD present. No thyromegaly  present.  Cardiovascular: Normal rate, regular rhythm and intact distal pulses.   Respiratory: Effort normal and breath sounds normal. No respiratory distress. She has no wheezes.  GI: Soft. Bowel sounds are normal. She exhibits no distension. There is no tenderness.  Musculoskeletal: She exhibits no edema.  Has left hemiparesis  Lymphadenopathy:    She has no cervical adenopathy.  Neurological: She is alert.  Skin: Skin is warm and dry. She is not diaphoretic.  Left lower abdomen and groin staples intact Left groin cath site intact Right lower abdomen incision line All without signs of infection   Psychiatric: She has a normal mood and affect.    ASSESSMENT/ PLAN:  TODAY:   1. Gout no recent flares is stable  will continue allopurinol 100 mg daily   2. Hypertension: is stable  b/p 137/69:   will continue norvasc 2.5 mg daily; lopressor 50 mg in the AM and 25 mg in the PM; has hydralazine 25 mg every 6 hours as needed for sb/p >170. Her vasotec was stopped in the hospital   3. Afib: is stable  heart rate regular will continue  Asa 81 mg daily; lopressor 50 mg in the AM and 25 mg in the PM will monitor   4. Dementia: is without change will continue aricept 10 mg daily; will monitor her weight is 124 pounds will need to continue to monitor   5. Diabetes: is stable  hgb a1c is 6.6  (previous 6.9)   She is off lantus and  metformin   6. Chronic systolic heart failure:  Ef is 35-40% (02-03-13)  is stable  Is currently not on medications will not make changes will monitor  7. Dyslipidemia: stable ldl 41; trig 131: will continue lipitor 10 mg daily   8. Seizures: stable no reports of recent activity present; will continue keppra 250 mg twice daily  10. Depression: is stable will continue zoloft 100 mg daily. Does receive benefit from this medication   11. Pulmonary nodule: no change will continue ct scans every 18  months and will monitor her status.   Her ct scan was done Dec 2016  She  declined ct scan this June.   12. CVA: is neurologically stable; has right hemiparesis; will continue asa 81 mg daily   13. Weight loss: her current weight is 124 pounds; will continue remeron 7.5 mg nightly  will continue supplements per facility protocol and will monitor  14. Stage III chronic kidney disease: no significant change: bun 30; creat 1.44   15.left femoral pseudoaneurysm: had a left fem to fem bypass pseudoaneurysm and  thrombectomy on 08-30-16.  Will complete levaquin 250 mg daily on 09-12-16.   16. Acute blood loss anemia:is stable  is status post blood transfusion; hgb 8.7;will monitor   17 right renal nephrolithiasis: with secondary mild right hydroureteronephrosis: is stable without symptoms; will monitor   Will need to see Dr. Trula Slade in 2 weeks Will need cbc cmp    MD is aware of resident's narcotic use and is in agreement with current plan of care. We will attempt to wean resident as apropriate     Ok Edwards NP [facility]  Contact [phone] Monday through Friday 8am- 5pm  After hours call [phone]

## 2016-09-03 NOTE — Telephone Encounter (Signed)
Sched appt 09/24/16 at 12:15. Spoke to pt's son.

## 2016-09-04 LAB — CBC AND DIFFERENTIAL
HCT: 32 — AB (ref 36–46)
HEMOGLOBIN: 10.5 — AB (ref 12.0–16.0)
Neutrophils Absolute: 8
Platelets: 220 (ref 150–399)
WBC: 10.2

## 2016-09-04 LAB — HEPATIC FUNCTION PANEL
ALK PHOS: 76 (ref 25–125)
ALT: 6 — AB (ref 7–35)
AST: 9 — AB (ref 13–35)
Bilirubin, Total: 0.6

## 2016-09-04 LAB — BASIC METABOLIC PANEL
BUN: 14 (ref 4–21)
CREATININE: 1.1 (ref 0.5–1.1)
Glucose: 236
POTASSIUM: 3.7 (ref 3.4–5.3)
Sodium: 139 (ref 137–147)

## 2016-09-05 ENCOUNTER — Non-Acute Institutional Stay (SKILLED_NURSING_FACILITY): Payer: Medicare Other | Admitting: Adult Health

## 2016-09-05 ENCOUNTER — Encounter: Payer: Self-pay | Admitting: Adult Health

## 2016-09-05 DIAGNOSIS — R1084 Generalized abdominal pain: Secondary | ICD-10-CM

## 2016-09-05 LAB — AEROBIC/ANAEROBIC CULTURE W GRAM STAIN (SURGICAL/DEEP WOUND): Culture: NO GROWTH

## 2016-09-05 LAB — AEROBIC/ANAEROBIC CULTURE (SURGICAL/DEEP WOUND)

## 2016-09-05 NOTE — Progress Notes (Signed)
Location:   [facility] Room Number: 106 A Place of Service:  SNF (31)   CODE STATUS: Full Code  Allergies  Allergen Reactions  . Penicillins Other (See Comments)    Patient states that she was told previously by a doctor to not take this medication but is unsure why.     Chief Complaint  Patient presents with  . Acute Visit    Pain Management    HPI:  She is status post left femoral pseudoaneurysm: had a left fem to fem bypass pseudoaneurysm and  thrombectomy on 08-30-16. She is having pain with movement. She cannot fully participate in the hpi or ros; but did have pain to palpation in her surgical area. Staff states that she does cry with movement.   Past Medical History:  Diagnosis Date  . Abdominal or pelvic swelling, mass, or lump, left upper quadrant   . CHF (congestive heart failure) ([place])   . Chronic atrial fibrillation ([place])   . Chronic kidney disease   . Congestive heart failure, unspecified   . Coronary atherosclerosis of unspecified type of vessel, native or graft   . Diabetes mellitus   . Edema   . GERD (gastroesophageal reflux disease)   . Gout, unspecified   . Hypercholesterolemia   . Hypertension   . 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  . Peripheral vascular disease, unspecified ([place])   . Seizures ([place])   . Sinus of Valsalva aneurysm    a. By 2D echo 05/2011.  . Stroke ([place])   . Unspecified hereditary and idiopathic peripheral neuropathy   . Unspecified urinary incontinence   . Vascular dementia, uncomplicated     Past Surgical History:  Procedure Laterality Date  . AORTOGRAM  09/01/2008   Abd w/ bilateral lower extremity runoff arteriography  . APPLICATION OF WOUND VAC Left 08/30/2016   Procedure: APPLICATION OF WOUND VAC LEFT GROIN;  Surgeon: Serafina Mitchell, MD;  Location: [facility];   Service: Vascular;  Laterality: Left;  . ENDARTERECTOMY FEMORAL Left 08/30/2016   Procedure: LEFT COMMON FEMORAL ARTERY ENDARTERECTOMY;  Surgeon: Serafina Mitchell, MD;  Location: [facility];  Service: Vascular;  Laterality: Left;  . FALSE ANEURYSM REPAIR  12/06/2011   Procedure: REPAIR FALSE ANEURYSM;  Surgeon: Angelia Mould, MD;  Location: [facility] OR;  Service: Vascular;  Laterality: Left;  Repair of left femoral Artery pseudoaneurysm  . FALSE ANEURYSM REPAIR Left 08/30/2016   Procedure: REPAIR PSEUDOANEURYSM LEFT GROIN;  Surgeon: Serafina Mitchell, MD;  Location: [facility];  Service: Vascular;  Laterality: Left;  . FEMORAL ARTERY EXPLORATION  12/06/2011   Procedure: FEMORAL ARTERY EXPLORATION;  Surgeon: Angelia Mould, MD;  Location: [facility] OR;  Service: Vascular;  Laterality: N/A;  Exploration of large pseudoaneurysm left side of fem-fem bypass graft   . femoral to femoral bypass graft     . FEMORAL-FEMORAL BYPASS GRAFT  12/06/2011   Procedure: BYPASS GRAFT FEMORAL-FEMORAL ARTERY;  Surgeon: Angelia Mould, MD;  Location: [facility] OR;  Service: Vascular;  Laterality: Bilateral;  Revision of left to right Femoral-Femoral bypass graft  . FEMORAL-FEMORAL BYPASS GRAFT Left 08/30/2016   Procedure: REDO RIGHT TO LEFT FEMORAL-FEMORAL ARTERY BYPASS GRAFT;  Surgeon: Serafina Mitchell, MD;  Location: [facility];  Service: Vascular;  Laterality: Left;  . FEMORAL-POPLITEAL BYPASS GRAFT Left 05/2002   lower extremity femoral to  below knee w/ non-versed greater saphenous vein  . FEMORAL-POPLITEAL BYPASS GRAFT Left 11/2002  . FEMORAL-POPLITEAL BYPASS GRAFT Left 08/30/2016   Procedure: REVISION OF LEFT FEMORAL-POPLITEAL ARTERY BYPASS GRAFT;  Surgeon: Serafina Mitchell, MD;  Location: [facility];  Service: Vascular;  Laterality: Left;  . THROMBECTOMY FEMORAL ARTERY Left 08/30/2016   Procedure: THROMBECTOMY OF FEMORAL-FEMORAL ARTERY BYPASS GRAFT AND LEFT FEMORAL TO LEFT POPLITEAL BYPASS GRAFT;  Surgeon: Serafina Mitchell, MD;  Location: [facility];  Service: Vascular;  Laterality: Left;  . TUBAL LIGATION Bilateral     Social History   Social History  . Marital status: Widowed    Spouse name: N/A  . Number of children: N/A  . Years of education: N/A   Occupational History  . Not on file.   Social History Main Topics  . Smoking status: Former Smoker    Types: Cigarettes    Quit date: 01/29/2001  . Smokeless tobacco: Never Used  . Alcohol use No  . Drug use: No  . Sexual activity: No   Other Topics Concern  . Not on file   Social History Narrative  . No narrative on file   Family History  Problem Relation Age of Onset  . Cancer Mother        Cervical  . Stroke Mother   . Diabetes Mother   . Stroke Father   . Heart disease Father       VITAL SIGNS BP 128/72   Pulse 66   Temp 98.7 F (37.1 C)   Resp 20   Ht 5' (1.524 m)   Wt 125 lb (56.7 kg)   SpO2 98%   BMI 24.41 kg/m   Patient's Medications  New Prescriptions   No medications on file  Previous Medications   ACETAMINOPHEN (TYLENOL) 325 MG TABLET    Take 2 tablets (650 mg total) by mouth every 6 (six) hours as needed for mild pain (or Fever >/= 101).   ALLOPURINOL (ZYLOPRIM) 100 MG TABLET    Take 100 mg by mouth daily.   AMINO ACIDS-PROTEIN HYDROLYS (FEEDING SUPPLEMENT, PRO-STAT SUGAR FREE 64,) LIQD    Take 30 mLs by mouth 2 (two) times daily.    AMLODIPINE (NORVASC) 2.5 MG TABLET    Take 2.5 mg by mouth daily.   ASPIRIN 81 MG TABLET    Take 81 mg by mouth daily.   ATORVASTATIN (LIPITOR) 10 MG TABLET    Take 10 mg by mouth at bedtime.    CETYLPYRIDINIUM CHLORIDE MT    Give 15 ml by mouth two times a day for oral irritation   DONEPEZIL (ARICEPT) 10 MG TABLET    Take 1 tablet (10 mg total) by mouth at bedtime.   GUAIFENESIN-DEXTROMETHORPHAN (ROBITUSSIN DM) 100-10 MG/5ML SYRUP    Take 15 mLs by mouth every 4 (four) hours as needed for cough.   HYDRALAZINE (APRESOLINE) 25 MG TABLET    Take 1 tablet (25 mg total) by mouth every 6 (six) hours as needed  (SBP > 170).   LEVETIRACETAM (KEPPRA) 250 MG TABLET    Take 250 mg by mouth 2 (two) times daily.   LEVOFLOXACIN (LEVAQUIN) 250 MG TABLET    Take 1 tablet (250 mg total) by mouth daily.   METOPROLOL TARTRATE (LOPRESSOR) 25 MG TABLET    Take 50 mg by mouth every morning. And 25mg  at night. Hold if pulse  < 60   MIRTAZAPINE (REMERON) 7.5 MG TABLET    Take 7.5 mg by mouth at  bedtime.   NUTRITIONAL SUPPLEMENTS (NUTRITIONAL SUPPLEMENT PO)    House Supplement - Give 120 ml by mouth three times daily   PANTOPRAZOLE (PROTONIX) 40 MG TABLET    Take 40 mg by mouth daily.   SERTRALINE (ZOLOFT) 50 MG TABLET    Take 50 mg by mouth daily.   Modified Medications   Modified Medication Previous Medication   OXYCODONE (OXY IR/ROXICODONE) 5 MG IMMEDIATE RELEASE TABLET oxyCODONE (OXY IR/ROXICODONE) 5 MG immediate release tablet      Take 1 tablet (5 mg total) by mouth every 6 (six) hours as needed for severe pain.    Take 5 mg by mouth every 6 (six) hours as needed for severe pain.  Discontinued Medications   MOUTH RINSE LIQD SOLUTION    15 mLs by Mouth Rinse route 2 times daily at 12 noon and 4 pm.   NUTRITIONAL SUPPLEMENTS (NUTRITIONAL SUPPLEMENT PO)    Give by mouth House Shakes 4 oz 3 times daily     SIGNIFICANT DIAGNOSTIC EXAMS  PREVIOUS   05-21-14: ct of chest: 1. Right upper lobe 5 mm nodule unchanged. 2. Additional nodules identified, some of which are noncalcified andvalso warrants follow-up. 3. Persistent mediastinal and hilar adenopathy. This may bevlong-standing given the previous reports of adenopathy. At the [facility] next exam, consider contrast-enhanced exam for further evaluation of the lymph nodes. 4. Given patient's history of smoking, follow-up chest CT at 6-12vmonths is recommended. 5. Coronary artery disease. . 01-10-15: ct of chest; 1. No acute findings. 2. Stable lung nodules as detailed above, the largest in the right upper lobe measuring 5 mm. Recommend additional unenhanced chest CT  follow-up and 18 months per Fleischner criteria for following pulmonary nodules.  08-29-16: ct of abdomen and pelvis: 1. 4.4 x 5.7 x 7.5 cm pseudoaneurysm arising from the left aspect of a fem-fem bypass graft in the left inguinal region. Finding suggestive of graft failure. Superimposed infection not excluded. 2. Extensive atherosclerotic calcifications throughout the intra-abdominal aorta and its branch vessels. 3. 6 mm calculus at the right UVJ with secondary mild right hydroureteronephrosis. Additional nonobstructive right renal nephrolithiasis as above. 4. Large volume stool within the rectal vault, suggesting constipation.  08-31-16: ABI:  Unable to obtain right ABI due to non-detectable waveforms. Left ABI of 0.43 is suggestive of severe arterial occlusive disease at rest. Unable to obtain bilateral TBI&'s due to low amplitude waveforms. Other specific details can be found in the table(s) above.  NO NEW EXAMS   LABS REVIEWED: PREVIOUS     09-14-15: hgb a1c 5.6 09-20-15: glucose 85; bun 26.8; creat 1.04; k+ 4.5; na++144; mag 1.7; dig 0.68  11-10-15; wbc 6.7 hgb 12.2; hct 37.3; mcv 86.0; plt 209; gucose 83; bun 24.1; creat 1.24; k+ 5.5; na++ 144; (creat clear 31.49); liver normal albumin 3.8; chol 117; ldl 41; trig 131; hdl 50   Urine micro-albumin <1.2  02-03-16: wbc 6.5; hgb 11.8; hct 38.9;mcv 92.7; plt 171; glucose 101; bun 29.2; creat 1.14; k+ 4.5; na++ 147; ca 10.4; (creat clear 34.25); liver normal albumin 3.8  05-16-16: wbc 6.9; hgb 11.7; ht 38.3; mcv 87.8; plt 186; glucose 107; bun 32.2; creat 1.08; k+ 4.4; na++ 142; ca 9.8; hgb a1c 6.9  06-26-16: tsh 4.68; dig <0.40 08-29-16: wbc 8.3; hgb 13.0; hct 38.7; mcv 79.5; plt 179; glucose 132; bun 28; creat 1.20; k+ 4.2; na++ 140; ca 9.8; liver normal albumin 3.6; urine culture; multiple bacteria   08-30-16: hgb a1c 6.6 09-01-16: wbc 12.0; hgb 8.7; hct 25.9;  mcv 83.5; plt 115; glucose 134; bun 30; creat 1.44; k+ 4.0; na++ 138; ca 8.8   NO NEW LABS    Review of Systems  Unable to perform ROS: Dementia (unable to answer questions )    Physical Exam  Constitutional: No distress.  Frail   Eyes:  Legally blind   Neck: Neck supple. No JVD present. No thyromegaly present.  Cardiovascular: Normal rate, regular rhythm and intact distal pulses.   Respiratory: Effort normal and breath sounds normal. No respiratory distress. She has no wheezes.  GI: Soft. Bowel sounds are normal. She exhibits no distension. There is no tenderness.  Musculoskeletal: She exhibits no edema.  Left hemiparesis   Lymphadenopathy:    She has no cervical adenopathy.  Neurological: She is alert.  Skin: Skin is warm and dry. She is not diaphoretic.  Left lower abdomen and groin staples intact Left groin cath site intact Right lower abdomen incision line All without signs of infection    Psychiatric: She has a normal mood and affect.    ASSESSMENT/ PLAN:  TODAY:   1. left femoral pseudoaneurysm: had a left fem to fem bypass pseudoaneurysm and  thrombectomy on 08-30-16.  Will complete levaquin 250 mg daily on 09-12-16.  Her pain is not controlled.  Will begin oxycodone 5 mg every 6 hours as needed for pain and will monitor her pain relief    MD is aware of resident's narcotic use and is in agreement with current plan of care. We will attempt to wean resident as apropriate    Ok Edwards NP [facility]  Contact [phone] Monday through Friday 8am- 5pm  After hours call [phone]

## 2016-09-06 ENCOUNTER — Non-Acute Institutional Stay (SKILLED_NURSING_FACILITY): Payer: Medicare Other | Admitting: Internal Medicine

## 2016-09-06 ENCOUNTER — Other Ambulatory Visit: Payer: Self-pay

## 2016-09-06 ENCOUNTER — Encounter: Payer: Self-pay | Admitting: Internal Medicine

## 2016-09-06 DIAGNOSIS — E1122 Type 2 diabetes mellitus with diabetic chronic kidney disease: Secondary | ICD-10-CM | POA: Diagnosis not present

## 2016-09-06 DIAGNOSIS — N183 Chronic kidney disease, stage 3 unspecified: Secondary | ICD-10-CM

## 2016-09-06 DIAGNOSIS — R52 Pain, unspecified: Secondary | ICD-10-CM | POA: Diagnosis not present

## 2016-09-06 DIAGNOSIS — M109 Gout, unspecified: Secondary | ICD-10-CM

## 2016-09-06 DIAGNOSIS — F0150 Vascular dementia without behavioral disturbance: Secondary | ICD-10-CM | POA: Diagnosis not present

## 2016-09-06 DIAGNOSIS — I480 Paroxysmal atrial fibrillation: Secondary | ICD-10-CM | POA: Diagnosis not present

## 2016-09-06 DIAGNOSIS — R569 Unspecified convulsions: Secondary | ICD-10-CM

## 2016-09-06 DIAGNOSIS — I70213 Atherosclerosis of native arteries of extremities with intermittent claudication, bilateral legs: Secondary | ICD-10-CM | POA: Diagnosis not present

## 2016-09-06 DIAGNOSIS — I69351 Hemiplegia and hemiparesis following cerebral infarction affecting right dominant side: Secondary | ICD-10-CM

## 2016-09-06 DIAGNOSIS — E1140 Type 2 diabetes mellitus with diabetic neuropathy, unspecified: Secondary | ICD-10-CM | POA: Diagnosis not present

## 2016-09-06 DIAGNOSIS — I724 Aneurysm of artery of lower extremity: Secondary | ICD-10-CM

## 2016-09-06 MED ORDER — OXYCODONE HCL 5 MG PO TABS: 5.0000 mg | ORAL_TABLET | Freq: Four times a day (QID) | ORAL | 0 refills | Status: DC | PRN

## 2016-09-06 MED ORDER — OXYCODONE HCL 5 MG PO TABS: ORAL_TABLET | ORAL | 0 refills | Status: DC

## 2016-09-06 NOTE — Telephone Encounter (Signed)
RX faxed to [facility] @ [phone], phone number [phone] 

## 2016-09-06 NOTE — Progress Notes (Deleted)
HISTORY AND PHYSICAL   DATE:   September 06, 2016  Location:   [facility] Room Number: 106 A Place of Service: SNF (31)   Extended Emergency Contact Information Primary Emergency Contact: Casey [name],Casey [name]  [place] Phone: [phone] Relation: Daughter Secondary Emergency Contact: Alfonzo Beers, [place] Phone: [phone] Mobile Phone: [phone] Relation: Niece  Advanced Directive information Does Patient Have a Medical Advance Directive?: Yes, Would patient like information on creating a medical advance directive?: No - Patient declined, Type of Advance Directive: Out of facility DNR (pink MOST or yellow form), Pre-existing out of facility DNR order (yellow form or pink MOST form): Pink MOST form placed in chart (order not valid for inpatient use), Does patient want to make changes to medical advance directive?: No - Patient declined  Chief Complaint  Patient presents with  . Readmit To SNF    Readmission    HPI:  ***  Past Medical History:  Diagnosis Date  . Abdominal or pelvic swelling, mass, or lump, left upper quadrant   . CHF (congestive heart failure) ([place])   . Chronic atrial fibrillation ([place])   . Chronic kidney disease   . Congestive heart failure, unspecified   . Coronary atherosclerosis of unspecified type of vessel, native or graft   . Diabetes mellitus   . Edema   . GERD (gastroesophageal reflux disease)   . Gout, unspecified   . Hypercholesterolemia   . Hypertension   . 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  . Peripheral vascular disease, unspecified ([place])   . Seizures ([place])   . Sinus of Valsalva aneurysm    a. By 2D echo 05/2011.  . Stroke ([place])   . Unspecified hereditary and idiopathic peripheral neuropathy   .  Unspecified urinary incontinence   . Vascular dementia, uncomplicated     Past Surgical History:  Procedure Laterality Date  . AORTOGRAM  09/01/2008   Abd w/ bilateral lower extremity runoff arteriography  . APPLICATION OF WOUND VAC Left 08/30/2016   Procedure: APPLICATION OF WOUND VAC LEFT GROIN;  Surgeon: Serafina Mitchell, MD;  Location: [facility];  Service: Vascular;  Laterality: Left;  . ENDARTERECTOMY FEMORAL Left 08/30/2016   Procedure: LEFT COMMON FEMORAL ARTERY ENDARTERECTOMY;  Surgeon: Serafina Mitchell, MD;  Location: [facility];  Service: Vascular;  Laterality: Left;  . FALSE ANEURYSM REPAIR  12/06/2011   Procedure: REPAIR FALSE ANEURYSM;  Surgeon: Angelia Mould, MD;  Location: [facility] OR;  Service: Vascular;  Laterality: Left;  Repair of left femoral Artery pseudoaneurysm  . FALSE ANEURYSM REPAIR Left 08/30/2016   Procedure: REPAIR PSEUDOANEURYSM LEFT GROIN;  Surgeon: Serafina Mitchell, MD;  Location: [place];  Service: Vascular;  Laterality: Left;  . FEMORAL ARTERY EXPLORATION  12/06/2011   Procedure: FEMORAL ARTERY EXPLORATION;  Surgeon: Angelia Mould, MD;  Location: [facility] OR;  Service: Vascular;  Laterality: N/A;  Exploration of large pseudoaneurysm left side of fem-fem bypass graft   . femoral to femoral bypass graft     . FEMORAL-FEMORAL BYPASS GRAFT  12/06/2011   Procedure: BYPASS GRAFT FEMORAL-FEMORAL ARTERY;  Surgeon: Angelia Mould, MD;  Location: [facility] OR;  Service: Vascular;  Laterality: Bilateral;  Revision of left to right Femoral-Femoral bypass graft  . FEMORAL-FEMORAL BYPASS GRAFT  Left 08/30/2016   Procedure: REDO RIGHT TO LEFT FEMORAL-FEMORAL ARTERY BYPASS GRAFT;  Surgeon: Serafina Mitchell, MD;  Location: [facility] [facility];  Service: Vascular;  Laterality: Left;  . FEMORAL-POPLITEAL BYPASS GRAFT Left 05/2002   lower extremity femoral to below knee w/ non-versed greater saphenous vein  . FEMORAL-POPLITEAL BYPASS GRAFT Left 11/2002  . FEMORAL-POPLITEAL BYPASS GRAFT Left 08/30/2016    Procedure: REVISION OF LEFT FEMORAL-POPLITEAL ARTERY BYPASS GRAFT;  Surgeon: Serafina Mitchell, MD;  Location: [place];  Service: Vascular;  Laterality: Left;  . THROMBECTOMY FEMORAL ARTERY Left 08/30/2016   Procedure: THROMBECTOMY OF FEMORAL-FEMORAL ARTERY BYPASS GRAFT AND LEFT FEMORAL TO LEFT POPLITEAL BYPASS GRAFT;  Surgeon: Serafina Mitchell, MD;  Location: [place] [facility];  Service: Vascular;  Laterality: Left;  . TUBAL LIGATION Bilateral     Patient Care Team: Gildardo Cranker, DO as PCP - General (Internal Medicine) Nyoka Cowden Phylis Bougie, NP as Nurse Practitioner ([place]) Center, [place] ([place])  Social History   Social History  . Marital status: Widowed    Spouse name: N/A  . Number of children: N/A  . Years of education: N/A   Occupational History  . Not on file.   Social History Main Topics  . Smoking status: Former Smoker    Types: Cigarettes    Quit date: 01/29/2001  . Smokeless tobacco: Never Used  . Alcohol use No  . Drug use: No  . Sexual activity: No   Other Topics Concern  . Not on file   Social History Narrative  . No narrative on file     reports that she quit smoking about 15 years ago. Her smoking use included Cigarettes. She has never used smokeless tobacco. She reports that she does not drink alcohol or use drugs.  Family History  Problem Relation Age of Onset  . Cancer Mother        Cervical  . Stroke Mother   . Diabetes Mother   . Stroke Father   . Heart disease Father    Family Status  Relation Status  . Mother Deceased at age 38       Cause of Death: Complications of diabetes  . Father Deceased       Cause of Death: Heart disease  . Brother Alive  . Daughter Alive  . Son Alive  . Sister Deceased  . Brother Deceased  . Brother Deceased    Immunization History  Administered Date(s) Administered  . Influenza Split 12/09/2011  . Influenza-Unspecified 11/27/2013, 03/08/2015, 11/03/2015  . PPD Test 01/26/2014,  10/14/2015  . Pneumococcal Polysaccharide-23 02/05/2013    Allergies  Allergen Reactions  . Penicillins Other (See Comments)    Patient states that she was told previously by a doctor to not take this medication but is unsure why.     Medications: Patient's Medications  New Prescriptions   No medications on file  Previous Medications   ACETAMINOPHEN (TYLENOL) 325 MG TABLET    Take 2 tablets (650 mg total) by mouth every 6 (six) hours as needed for mild pain (or Fever >/= 101).   ALLOPURINOL (ZYLOPRIM) 100 MG TABLET    Take 100 mg by mouth daily.   AMINO ACIDS-PROTEIN HYDROLYS (FEEDING SUPPLEMENT, PRO-STAT SUGAR FREE 64,) LIQD    Take 30 mLs by mouth 2 (two) times daily.    AMLODIPINE (NORVASC) 2.5 MG TABLET    Take 2.5 mg by mouth daily.   ASPIRIN 81 MG TABLET    Take 81 mg by mouth  daily.   ATORVASTATIN (LIPITOR) 10 MG TABLET    Take 10 mg by mouth at bedtime.    CETYLPYRIDINIUM CHLORIDE MT    Give 15 ml by mouth two times a day for oral irritation   DONEPEZIL (ARICEPT) 10 MG TABLET    Take 1 tablet (10 mg total) by mouth at bedtime.   GUAIFENESIN-DEXTROMETHORPHAN (ROBITUSSIN DM) 100-10 MG/5ML SYRUP    Take 15 mLs by mouth every 4 (four) hours as needed for cough.   HYDRALAZINE (APRESOLINE) 25 MG TABLET    Take 1 tablet (25 mg total) by mouth every 6 (six) hours as needed (SBP > 170).   LEVETIRACETAM (KEPPRA) 250 MG TABLET    Take 250 mg by mouth 2 (two) times daily.   LEVOFLOXACIN (LEVAQUIN) 250 MG TABLET    Take 1 tablet (250 mg total) by mouth daily.   METOPROLOL TARTRATE (LOPRESSOR) 25 MG TABLET    Take 50 mg by mouth every morning. And 13m at night. Hold if pulse  < 60   MIRTAZAPINE (REMERON) 7.5 MG TABLET    Take 7.5 mg by mouth at bedtime.   NUTRITIONAL SUPPLEMENTS (NUTRITIONAL SUPPLEMENT PO)    House Supplement - Give 120 ml by mouth three times daily   OXYCODONE (OXY IR/ROXICODONE) 5 MG IMMEDIATE RELEASE TABLET    Take 5 mg by mouth every 6 (six) hours as needed for severe  pain.   PANTOPRAZOLE (PROTONIX) 40 MG TABLET    Take 40 mg by mouth daily.   SERTRALINE (ZOLOFT) 50 MG TABLET    Take 50 mg by mouth daily.   Modified Medications   No medications on file  Discontinued Medications   No medications on file    Review of Systems  Vitals:   09/06/16 0942  BP: 128/72  Pulse: 66  Resp: 20  Temp: 98.7 F (37.1 C)  SpO2: 98%  Weight: 125 lb (56.7 kg)  Height: 5' (1.524 m)   Body mass index is 24.41 kg/m.  Physical Exam   Labs reviewed: Nursing Home on 09/03/2016  Component Date Value Ref Range Status  . Glucose 08/07/2016 98   Final  . BUN 08/07/2016 28* 4 - 21 Final  . Creatinine 08/07/2016 1.4* 0.5 - 1.1 Final  . Potassium 08/07/2016 4.2  3.4 - 5.3 Final  . Sodium 08/07/2016 142  137 - 147 Final  . Triglycerides 08/07/2016 145  40 - 160 Final  . Cholesterol 08/07/2016 141  0 - 200 Final  . HDL 08/07/2016 53  35 - 70 Final  . LDL Cholesterol 08/07/2016 59   Final  . Alkaline Phosphatase 08/07/2016 94  25 - 125 Final  . ALT 08/07/2016 10  7 - 35 Final  . AST 08/07/2016 14  13 - 35 Final  . Bilirubin, Total 08/07/2016 0.3   Final  Admission on 08/29/2016, Discharged on 09/02/2016  Component Date Value Ref Range Status  . WBC 08/29/2016 8.3  4.0 - 10.5 K/uL Final  . RBC 08/29/2016 4.87  3.87 - 5.11 MIL/uL Final  . Hemoglobin 08/29/2016 13.0  12.0 - 15.0 g/dL Final  . HCT 08/29/2016 38.7  36.0 - 46.0 % Final  . MCV 08/29/2016 79.5  78.0 - 100.0 fL Final  . MCH 08/29/2016 26.7  26.0 - 34.0 pg Final  . MCHC 08/29/2016 33.6  30.0 - 36.0 g/dL Final  . RDW 08/29/2016 15.8* 11.5 - 15.5 % Final  . Platelets 08/29/2016 179  150 - 400 K/uL Final  . Neutrophils  Relative % 08/29/2016 66  % Final  . Neutro Abs 08/29/2016 5.6  1.7 - 7.7 K/uL Final  . Lymphocytes Relative 08/29/2016 20  % Final  . Lymphs Abs 08/29/2016 1.7  0.7 - 4.0 K/uL Final  . Monocytes Relative 08/29/2016 10  % Final  . Monocytes Absolute 08/29/2016 0.8  0.1 - 1.0 K/uL Final    . Eosinophils Relative 08/29/2016 3  % Final  . Eosinophils Absolute 08/29/2016 0.2  0.0 - 0.7 K/uL Final  . Basophils Relative 08/29/2016 1  % Final  . Basophils Absolute 08/29/2016 0.0  0.0 - 0.1 K/uL Final  . Sodium 08/29/2016 140  135 - 145 mmol/L Final  . Potassium 08/29/2016 4.2  3.5 - 5.1 mmol/L Final  . Chloride 08/29/2016 105  101 - 111 mmol/L Final  . CO2 08/29/2016 26  22 - 32 mmol/L Final  . Glucose, Bld 08/29/2016 132* 65 - 99 mg/dL Final  . BUN 08/29/2016 28* 6 - 20 mg/dL Final  . Creatinine, Ser 08/29/2016 1.20* 0.44 - 1.00 mg/dL Final  . Calcium 08/29/2016 9.8  8.9 - 10.3 mg/dL Final  . Total Protein 08/29/2016 7.4  6.5 - 8.1 g/dL Final  . Albumin 08/29/2016 3.6  3.5 - 5.0 g/dL Final  . AST 08/29/2016 18  15 - 41 U/L Final  . ALT 08/29/2016 15  14 - 54 U/L Final  . Alkaline Phosphatase 08/29/2016 90  38 - 126 U/L Final  . Total Bilirubin 08/29/2016 0.7  0.3 - 1.2 mg/dL Final  . GFR calc non Af Amer 08/29/2016 43* >60 mL/min Final  . GFR calc Af Amer 08/29/2016 50* >60 mL/min Final   Comment: (NOTE) The eGFR has been calculated using the CKD EPI equation. This calculation has not been validated in all clinical situations. eGFR's persistently <60 mL/min signify possible Chronic Kidney Disease.   . Anion gap 08/29/2016 9  5 - 15 Final  . Color, Urine 08/29/2016 YELLOW  YELLOW Final  . APPearance 08/29/2016 CLEAR  CLEAR Final  . Specific Gravity, Urine 08/29/2016 1.012  1.005 - 1.030 Final  . pH 08/29/2016 7.0  5.0 - 8.0 Final  . Glucose, UA 08/29/2016 NEGATIVE  NEGATIVE mg/dL Final  . Hgb urine dipstick 08/29/2016 SMALL* NEGATIVE Final  . Bilirubin Urine 08/29/2016 NEGATIVE  NEGATIVE Final  . Ketones, ur 08/29/2016 NEGATIVE  NEGATIVE mg/dL Final  . Protein, ur 08/29/2016 100* NEGATIVE mg/dL Final  . Nitrite 08/29/2016 NEGATIVE  NEGATIVE Final  . Leukocytes, UA 08/29/2016 TRACE* NEGATIVE Final  . RBC / HPF 08/29/2016 0-5  0 - 5 RBC/hpf Final  . WBC, UA 08/29/2016  0-5  0 - 5 WBC/hpf Final  . Bacteria, UA 08/29/2016 MANY* NONE SEEN Final  . Squamous Epithelial / LPF 08/29/2016 0-5* NONE SEEN Final  . Mucous 08/29/2016 PRESENT   Final  . Specimen Description 08/29/2016 URINE, CLEAN CATCH   Final  . Special Requests 08/29/2016 NONE   Final  . Culture 08/29/2016 MULTIPLE SPECIES PRESENT, SUGGEST RECOLLECTION*  Final  . Report Status 08/29/2016 08/31/2016 FINAL   Final  . Prothrombin Time 08/30/2016 13.3  11.4 - 15.2 seconds Final  . INR 08/30/2016 1.01   Final  . MRSA by PCR 08/30/2016 NEGATIVE  NEGATIVE Final   Comment:        The GeneXpert MRSA Assay (FDA approved for NASAL specimens only), is one component of a comprehensive MRSA colonization surveillance program. It is not intended to diagnose MRSA infection nor to guide or monitor treatment  for MRSA infections.   . ABO/RH(D) 08/30/2016 B POS   Final  . Antibody Screen 08/30/2016 NEG   Final  . Sample Expiration 08/30/2016 09/02/2016   Final  . Unit Number 08/30/2016 [id number]   Final  . Blood Component Type 08/30/2016 RED CELLS,LR   Final  . Unit division 08/30/2016 00   Final  . Status of Unit 08/30/2016 ISSUED,FINAL   Final  . Transfusion Status 08/30/2016 OK TO TRANSFUSE   Final  . Crossmatch Result 08/30/2016 Compatible   Final  . Unit Number 08/30/2016 [id number]   Final  . Blood Component Type 08/30/2016 RBC LR PHER2   Final  . Unit division 08/30/2016 00   Final  . Status of Unit 08/30/2016 ISSUED,FINAL   Final  . Transfusion Status 08/30/2016 OK TO TRANSFUSE   Final  . Crossmatch Result 08/30/2016 Compatible   Final  . Specimen Description 08/30/2016 TISSUE LEFT FEMORAL ARTERY   Final  . Special Requests 08/30/2016 GRAFT TISSUE POF VANC   Final  . Gram Stain 08/30/2016    Final                   Value:RARE WBC PRESENT, PREDOMINANTLY PMN NO ORGANISMS SEEN   . Culture 08/30/2016 No growth aerobically or anaerobically.   Final  . Report Status 08/30/2016 09/05/2016  FINAL   Final  . Glucose-Capillary 08/30/2016 191* 65 - 99 mg/dL Final  . Sodium 08/31/2016 138  135 - 145 mmol/L Final  . Potassium 08/31/2016 4.6  3.5 - 5.1 mmol/L Final  . Chloride 08/31/2016 108  101 - 111 mmol/L Final  . CO2 08/31/2016 21* 22 - 32 mmol/L Final  . Glucose, Bld 08/31/2016 192* 65 - 99 mg/dL Final  . BUN 08/31/2016 27* 6 - 20 mg/dL Final  . Creatinine, Ser 08/31/2016 1.59* 0.44 - 1.00 mg/dL Final  . Calcium 08/31/2016 8.6* 8.9 - 10.3 mg/dL Final  . GFR calc non Af Amer 08/31/2016 30* >60 mL/min Final  . GFR calc Af Amer 08/31/2016 35* >60 mL/min Final   Comment: (NOTE) The eGFR has been calculated using the CKD EPI equation. This calculation has not been validated in all clinical situations. eGFR's persistently <60 mL/min signify possible Chronic Kidney Disease.   . Anion gap 08/31/2016 9  5 - 15 Final  . Hgb A1c MFr Bld 08/30/2016 6.6* 4.8 - 5.6 % Final   Comment: (NOTE)         Pre-diabetes: 5.7 - 6.4         Diabetes: >6.4         Glycemic control for adults with diabetes: <7.0   . Mean Plasma Glucose 08/30/2016 143  mg/dL Final   Comment: (NOTE) Performed At: [facility] [place], [place] [id number] Lindon Romp MD IR:[id number]   . Glucose-Capillary 08/30/2016 223* 65 - 99 mg/dL Final  . Comment 1 08/30/2016 Capillary Specimen   Final  . Comment 2 08/30/2016 Notify RN   Final  . WBC 08/31/2016 12.2* 4.0 - 10.5 K/uL Final  . RBC 08/31/2016 2.78* 3.87 - 5.11 MIL/uL Final  . Hemoglobin 08/31/2016 7.3* 12.0 - 15.0 g/dL Final  . HCT 08/31/2016 22.3* 36.0 - 46.0 % Final  . MCV 08/31/2016 80.2  78.0 - 100.0 fL Final  . MCH 08/31/2016 26.3  26.0 - 34.0 pg Final  . MCHC 08/31/2016 32.7  30.0 - 36.0 g/dL Final  . RDW 08/31/2016 16.2* 11.5 - 15.5 % Final  . Platelets 08/31/2016 137*  150 - 400 K/uL Final  . Glucose-Capillary 08/31/2016 200* 65 - 99 mg/dL Final  . Comment 1 08/31/2016 Notify RN   Final  . Order Confirmation 08/31/2016  ORDER PROCESSED BY BLOOD BANK   Final  . ISSUE DATE / TIME 08/30/2016 [id number]   Final  . Blood Product Unit Number 08/30/2016 [id number]   Final  . PRODUCT CODE 08/30/2016 [id number]   Final  . Unit Type and Rh 08/30/2016 7300   Final  . Blood Product Expiration Date 08/30/2016 [id number]   Final  . ISSUE DATE / TIME 08/30/2016 [id number]   Final  . Blood Product Unit Number 08/30/2016 [id number]   Final  . PRODUCT CODE 08/30/2016 [id number]   Final  . Unit Type and Rh 08/30/2016 7300   Final  . Blood Product Expiration Date 08/30/2016 [id number]   Final  . Glucose-Capillary 08/31/2016 132* 65 - 99 mg/dL Final  . Comment 1 08/31/2016 Notify RN   Final  . Glucose-Capillary 08/31/2016 61* 65 - 99 mg/dL Final  . Comment 1 08/31/2016 Notify RN   Final  . Sodium 09/01/2016 137  135 - 145 mmol/L Final  . Potassium 09/01/2016 4.0  3.5 - 5.1 mmol/L Final  . Chloride 09/01/2016 110  101 - 111 mmol/L Final  . CO2 09/01/2016 22  22 - 32 mmol/L Final  . Glucose, Bld 09/01/2016 134* 65 - 99 mg/dL Final  . BUN 09/01/2016 30* 6 - 20 mg/dL Final  . Creatinine, Ser 09/01/2016 1.44* 0.44 - 1.00 mg/dL Final  . Calcium 09/01/2016 8.8* 8.9 - 10.3 mg/dL Final  . GFR calc non Af Amer 09/01/2016 34* >60 mL/min Final  . GFR calc Af Amer 09/01/2016 40* >60 mL/min Final   Comment: (NOTE) The eGFR has been calculated using the CKD EPI equation. This calculation has not been validated in all clinical situations. eGFR's persistently <60 mL/min signify possible Chronic Kidney Disease.   . Anion gap 09/01/2016 5  5 - 15 Final  . WBC 09/01/2016 12.0* 4.0 - 10.5 K/uL Final  . RBC 09/01/2016 3.10* 3.87 - 5.11 MIL/uL Final  . Hemoglobin 09/01/2016 8.7* 12.0 - 15.0 g/dL Final  . HCT 09/01/2016 25.9* 36.0 - 46.0 % Final  . MCV 09/01/2016 83.5  78.0 - 100.0 fL Final  . MCH 09/01/2016 28.1  26.0 - 34.0 pg Final  . MCHC 09/01/2016 33.6  30.0 - 36.0 g/dL Final  . RDW 09/01/2016 16.3* 11.5 - 15.5 % Final    . Platelets 09/01/2016 115* 150 - 400 K/uL Final   PLATELET COUNT CONFIRMED BY SMEAR  . Hemoglobin 08/31/2016 9.6* 12.0 - 15.0 g/dL Final   POST TRANSFUSION SPECIMEN  . HCT 08/31/2016 29.4* 36.0 - 46.0 % Final  . Glucose-Capillary 08/31/2016 90  65 - 99 mg/dL Final  . Comment 1 08/31/2016 Notify RN   Final  . Glucose-Capillary 08/31/2016 232* 65 - 99 mg/dL Final  . Glucose-Capillary 09/01/2016 178* 65 - 99 mg/dL Final  . Glucose-Capillary 09/01/2016 134* 65 - 99 mg/dL Final  . Glucose-Capillary 09/01/2016 145* 65 - 99 mg/dL Final  . Sodium 09/02/2016 135  135 - 145 mmol/L Final  . Potassium 09/02/2016 3.8  3.5 - 5.1 mmol/L Final  . Chloride 09/02/2016 106  101 - 111 mmol/L Final  . CO2 09/02/2016 23  22 - 32 mmol/L Final  . Glucose, Bld 09/02/2016 151* 65 - 99 mg/dL Final  . BUN 09/02/2016 27* 6 - 20 mg/dL Final  . Creatinine, Ser 09/02/2016 1.28* 0.44 -  1.00 mg/dL Final  . Calcium 09/02/2016 9.0  8.9 - 10.3 mg/dL Final  . GFR calc non Af Amer 09/02/2016 40* >60 mL/min Final  . GFR calc Af Amer 09/02/2016 46* >60 mL/min Final   Comment: (NOTE) The eGFR has been calculated using the CKD EPI equation. This calculation has not been validated in all clinical situations. eGFR's persistently <60 mL/min signify possible Chronic Kidney Disease.   . Anion gap 09/02/2016 6  5 - 15 Final  . Glucose-Capillary 09/01/2016 177* 65 - 99 mg/dL Final  . Glucose-Capillary 09/02/2016 159* 65 - 99 mg/dL Final  . Glucose-Capillary 09/02/2016 137* 65 - 99 mg/dL Final  . Glucose-Capillary 09/02/2016 128* 65 - 99 mg/dL Final  Nursing Home on 08/06/2016  Component Date Value Ref Range Status  . Glucose 02/03/2016 101   Final  . BUN 02/03/2016 29* 4 - 21 Final  . Creatinine 02/03/2016 1.1  0.5 - 1.1 Final  . Potassium 02/03/2016 4.5  3.4 - 5.3 Final  . Sodium 02/03/2016 142  137 - 147 Final  . Alkaline Phosphatase 02/03/2016 76  25 - 125 Final  . ALT 02/03/2016 20  7 - 35 Final  . AST 02/03/2016 27   13 - 35 Final  . Bilirubin, Total 02/03/2016 0.3   Final  . TSH 06/26/2016 4.68  0.41 - 5.90 Final    Ct Abdomen Pelvis W Contrast  Result Date: 08/29/2016 CLINICAL DATA:  Initial evaluation for acute abdominal pain, left rolling mass. EXAM: CT ABDOMEN AND PELVIS WITH CONTRAST TECHNIQUE: Multidetector CT imaging of the abdomen and pelvis was performed using the standard protocol following bolus administration of intravenous contrast. CONTRAST:  111m ISOVUE-300 IOPAMIDOL (ISOVUE-300) INJECTION 61% COMPARISON:  Prior CT from 12/04/2011. FINDINGS: Lower chest: Scattered atelectatic changes noted within the visualized lung bases. Cardiomegaly partially visualized. No pleural or pericardial effusion. Hepatobiliary: Liver demonstrates a normal contrast enhanced appearance. Gallbladder within normal limits. No biliary dilatation. Pancreas: Pancreas somewhat atrophic but otherwise unremarkable without acute inflammation or mass lesion. Spleen: Subcentimeter hypodensity noted within central aspect of the spleen, of doubtful significance. Spleen otherwise unremarkable. Adrenals/Urinary Tract: Adrenal glands within normal limits. Kidneys relatively equal in size with symmetric enhancement. Scattered cortical thinning and scarring. 5.6 cm cyst noted extending from the lower pole left kidney. Additional scattered subcentimeter hypodensities within the bilateral kidneys too small the characterize, but statistically likely reflects small cysts as well. Multiple nonobstructive calculi present within the right kidney, largest of which positioned within the lower pole and measures approximately 15 mm. Mild right hydroureteronephrosis. There is an obstructive 6 mm calculus at the right UVJ (series 2, image 64). No left-sided renal calculi. No left-sided hydronephrosis or hydroureter. Bladder largely decompressed. Mild circumferential bladder wall thickening like related incomplete distension. Stomach/Bowel: Stomach within  normal limits. No evidence for bowel obstruction. Appendix within normal limits. No acute inflammatory changes seen about the bowels. Large volume stool seen packed within the rectal vault, suggesting constipation. Vascular/Lymphatic: Extensive atherosclerosis throughout the intra-abdominal aorta and its branch vessels. Origin of the celiac axis, SMA, renal arteries, and IMA are grossly patent. Occlusion of the left common iliac just distal to its origin (series 2, image 42). Left internal iliac occluded proximally, with distal reconstitution likely via collateralization. Occluded vascular stent in place within the left external iliac artery. Extensive atherosclerotic disease throughout the right iliac system. Fem-fem bypass graft in place. That right common femoral anastomosis appears patent. Large pseudoaneurysm measuring 4.4 x 5.7 x 7.5 cm arises from the  left common femoral anastomosis in the left inguinal region (series 2, image 63). Opacification of the left common femoral artery distally. Reproductive: Scattered calcified uterine fibroids noted. Uterus and ovaries otherwise within normal limits. Other: No free air or fluid. Musculoskeletal: Small intramuscular lipoma noted within the left abdominus musculature. No acute osseous abnormality. No worrisome lytic or blastic osseous lesions. IMPRESSION: 1. 4.4 x 5.7 x 7.5 cm pseudoaneurysm arising from the left aspect of a fem-fem bypass graft in the left inguinal region. Finding suggestive of graft failure. Superimposed infection not excluded. 2. Extensive atherosclerotic calcifications throughout the intra-abdominal aorta and its branch vessels. 3. 6 mm calculus at the right UVJ with secondary mild right hydroureteronephrosis. Additional nonobstructive right renal nephrolithiasis as above. 4. Large volume stool within the rectal vault, suggesting constipation. Critical Value/emergent results were called by telephone at the time of interpretation on 08/29/2016 at  11:29 pm to Dr. Gareth Morgan , who verbally acknowledged these results. Electronically Signed   By: Jeannine Boga M.D.   On: 08/29/2016 23:34     Assessment/Plan    Monica S. Perlie Gold  [facility] [address] [place], [place] [address] [phone] Cell (Monday-Friday 8 AM - 5 PM) [phone] After 5 PM and follow prompts

## 2016-09-06 NOTE — Progress Notes (Signed)
Patient ID: Casey [name], female   DOB: 09-20-1940, 76 y.o.   MRN: [id number]     HISTORY AND PHYSICAL   DATE:  September 06, 2016  Location:   [place] Room Number: 106 A Place of Service: SNF (31)   Extended Emergency Contact Information Primary Emergency Contact: Armstrong,Pat  [place] Phone: [phone] Relation: Daughter Secondary Emergency Contact: Alfonzo Beers, [place] Phone: [phone] Mobile Phone: [phone] Relation: Niece  Advanced Directive information Does Patient Have a Medical Advance Directive?: Yes, Would patient like information on creating a medical advance directive?: No - Patient declined, Type of Advance Directive: Out of facility DNR (pink MOST or yellow form), Pre-existing out of facility DNR order (yellow form or pink MOST form): Pink MOST form placed in chart (order not valid for inpatient use), Does patient want to make changes to medical advance directive?: No - Patient declined  Chief Complaint  Patient presents with  . Readmit To SNF    Readmission    HPI:  76 yo female long term resident seen today as a readmission into SNF following hospital stay for femoral pseudoaneurysm, PAD with intermittent claudication, vascular dementia, sz d/o, hx CVA with right hemiparesis, PAF, chronic sHF, stage 3 CKD, DM, legally blind. She presented to the ED with left groin bulge. CT abd/pelvis with contrast showed 7.5cm left femoral artery pseudoaneursym. She was tx to [facility] where she underwent repair by vascular sx Dr Trula Slade on 09/18/16. Hgb 13-->7.3-->2 units PRBCs-->8.7; plts 115K; Cr 1.2-->1.59-->1.28; albumin 3.6; A1c 6.6% at d/c. She presents to SNF for long term care.    Today she reports her pain has improved since pain meds adjusted yesterday. No other concerns. Appetite reduced. Sleeps well. No falls. No nursing issues. She is a poor historian due to dementia.  Hx obtained from chart  Gout - stable on allopurinol 100 mg daily. No recent flares.  Hypertension - BP stable on norvasc 2.5 mg daily; lopressor 50 mg in the AM and 25 mg in the PM; hydralazine 25 mg every 6 hours as needed for SBP>170. No longer on vasotec   PAF - rate controlled on lopressor 50 mg in the AM and 25 mg in the PM. She takes ASA daily.   Dementia - stable on aricept 10 mg daily;  Albumin 3.6. She gets nutritional supplements per facility protocol   DM - diet controlled. A1c 6.6%  (previous 6.9)     Chronic systolic heart failure:  Ef is 35-40% (02-03-13)  is stable  Is currently not on medications will not make changes will monitor  Dyslipidemia - stable on lipitor.  LDL 41; TG 131  Seizure d/o - stable on keppra 250 mg twice daily. No exacerbations of sz  Depression - mood stable on zoloft 100 mg daily. She does receive benefit from this medication   Pulmonary nodule - unchanged. She gets CT chest every 18  months last done Dec 2016.  She declined CT in June 2018.Marland Kitchen   Hx CVA - has right hemiparesis; takes ASA 81 mg daily   Weight loss - stable. She takes remeron and gets nutritional supplements per facility protocol  CKD - stage 3. Cr 1.28   Right renal nephrolithiasis with 2ndry mild right hydroureteronephrosis - stable. Asymptomatic.     Past Medical History:  Diagnosis Date  . Abdominal or pelvic swelling, mass, or lump, left upper quadrant   .  CHF (congestive heart failure) ([place])   . Chronic atrial fibrillation ([place])   . Chronic kidney disease   . Congestive heart failure, unspecified   . Coronary atherosclerosis of unspecified type of vessel, native or graft   . Diabetes mellitus   . Edema   . GERD (gastroesophageal reflux disease)   . Gout, unspecified   . Hypercholesterolemia   . Hypertension   . 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  . Peripheral vascular disease, unspecified ([place])   . Seizures ([place])   . Sinus of Valsalva aneurysm    a. By 2D echo 05/2011.  . Stroke ([place])   . Unspecified hereditary and idiopathic peripheral neuropathy   . Unspecified urinary incontinence   . Vascular dementia, uncomplicated     Past Surgical History:  Procedure Laterality Date  . AORTOGRAM  09/01/2008   Abd w/ bilateral lower extremity runoff arteriography  . APPLICATION OF WOUND VAC Left 08/30/2016   Procedure: APPLICATION OF WOUND VAC LEFT GROIN;  Surgeon: Serafina Mitchell, MD;  Location: [place];  Service: Vascular;  Laterality: Left;  . ENDARTERECTOMY FEMORAL Left 08/30/2016   Procedure: LEFT COMMON FEMORAL ARTERY ENDARTERECTOMY;  Surgeon: Serafina Mitchell, MD;  Location: [place];  Service: Vascular;  Laterality: Left;  . FALSE ANEURYSM REPAIR  12/06/2011   Procedure: REPAIR FALSE ANEURYSM;  Surgeon: Angelia Mould, MD;  Location[facility];  Service: Vascular;  Laterality: Left;  Repair of left femoral Artery pseudoaneurysm  . FALSE ANEURYSM REPAIR Left 08/30/2016   Procedure: REPAIR PSEUDOANEURYSM LEFT GROIN;  Surgeon: Serafina Mitchell, MD;  Location: [place];  Service: Vascular;  Laterality: Left;  . FEMORAL ARTERY EXPLORATION  12/06/2011   Procedure: FEMORAL ARTERY EXPLORATION;  Surgeon: Angelia Mould, MD;  Location: [facility] OR;  Service: Vascular;  Laterality: N/A;  Exploration of large pseudoaneurysm left side of fem-fem bypass graft   . femoral to femoral bypass graft     . FEMORAL-FEMORAL BYPASS GRAFT  12/06/2011   Procedure: BYPASS GRAFT FEMORAL-FEMORAL ARTERY;  Surgeon: Angelia Mould, MD;  Location: [facility] OR;  Service: Vascular;  Laterality: Bilateral;  Revision of left to right Femoral-Femoral bypass graft  . FEMORAL-FEMORAL BYPASS GRAFT Left 08/30/2016   Procedure: REDO RIGHT TO LEFT FEMORAL-FEMORAL ARTERY BYPASS GRAFT;  Surgeon: Serafina Mitchell, MD;  Location: [place];  Service: Vascular;   Laterality: Left;  . FEMORAL-POPLITEAL BYPASS GRAFT Left 05/2002   lower extremity femoral to below knee w/ non-versed greater saphenous vein  . FEMORAL-POPLITEAL BYPASS GRAFT Left 11/2002  . FEMORAL-POPLITEAL BYPASS GRAFT Left 08/30/2016   Procedure: REVISION OF LEFT FEMORAL-POPLITEAL ARTERY BYPASS GRAFT;  Surgeon: Serafina Mitchell, MD;  Location: [place];  Service: Vascular;  Laterality: Left;  . THROMBECTOMY FEMORAL ARTERY Left 08/30/2016   Procedure: THROMBECTOMY OF FEMORAL-FEMORAL ARTERY BYPASS GRAFT AND LEFT FEMORAL TO LEFT POPLITEAL BYPASS GRAFT;  Surgeon: Serafina Mitchell, MD;  Location: [place];  Service: Vascular;  Laterality: Left;  . TUBAL LIGATION Bilateral     Patient Care Team: Gildardo Cranker, DO as PCP - General (Internal Medicine) Nyoka Cowden Phylis Bougie, NP as Nurse Practitioner ([place], [place] ([place])  Social History   Social History  . Marital status: Widowed    Spouse name: N/A  . Number of children: N/A  . Years of education: N/A  Occupational History  . Not on file.   Social History Main Topics  . Smoking status: Former Smoker    Types: Cigarettes    Quit date: 01/29/2001  . Smokeless tobacco: Never Used  . Alcohol use No  . Drug use: No  . Sexual activity: No   Other Topics Concern  . Not on file   Social History Narrative  . No narrative on file     reports that she quit smoking about 15 years ago. Her smoking use included Cigarettes. She has never used smokeless tobacco. She reports that she does not drink alcohol or use drugs.  Family History  Problem Relation Age of Onset  . Cancer Mother        Cervical  . Stroke Mother   . Diabetes Mother   . Stroke Father   . Heart disease Father    Family Status  Relation Status  . Mother Deceased at age 107       Cause of Death: Complications of diabetes  . Father Deceased       Cause of Death: Heart disease  . Brother Alive  . Daughter Alive  . Son Alive    . Sister Deceased  . Brother Deceased  . Brother Deceased    Immunization History  Administered Date(s) Administered  . Influenza Split 12/09/2011  . Influenza-Unspecified 11/27/2013, 03/08/2015, 11/03/2015  . PPD Test 01/26/2014, 10/14/2015  . Pneumococcal Polysaccharide-23 02/05/2013    Allergies  Allergen Reactions  . Penicillins Other (See Comments)    Patient states that she was told previously by a doctor to not take this medication but is unsure why.     Medications: Patient's Medications  New Prescriptions   No medications on file  Previous Medications   ACETAMINOPHEN (TYLENOL) 325 MG TABLET    Take 2 tablets (650 mg total) by mouth every 6 (six) hours as needed for mild pain (or Fever >/= 101).   ALLOPURINOL (ZYLOPRIM) 100 MG TABLET    Take 100 mg by mouth daily.   AMINO ACIDS-PROTEIN HYDROLYS (FEEDING SUPPLEMENT, PRO-STAT SUGAR FREE 64,) LIQD    Take 30 mLs by mouth 2 (two) times daily.    AMLODIPINE (NORVASC) 2.5 MG TABLET    Take 2.5 mg by mouth daily.   ASPIRIN 81 MG TABLET    Take 81 mg by mouth daily.   ATORVASTATIN (LIPITOR) 10 MG TABLET    Take 10 mg by mouth at bedtime.    CETYLPYRIDINIUM CHLORIDE MT    Give 15 ml by mouth two times a day for oral irritation   DONEPEZIL (ARICEPT) 10 MG TABLET    Take 1 tablet (10 mg total) by mouth at bedtime.   GUAIFENESIN-DEXTROMETHORPHAN (ROBITUSSIN DM) 100-10 MG/5ML SYRUP    Take 15 mLs by mouth every 4 (four) hours as needed for cough.   HYDRALAZINE (APRESOLINE) 25 MG TABLET    Take 1 tablet (25 mg total) by mouth every 6 (six) hours as needed (SBP > 170).   LEVETIRACETAM (KEPPRA) 250 MG TABLET    Take 250 mg by mouth 2 (two) times daily.   LEVOFLOXACIN (LEVAQUIN) 250 MG TABLET    Take 1 tablet (250 mg total) by mouth daily.   METOPROLOL TARTRATE (LOPRESSOR) 25 MG TABLET    Take 50 mg by mouth every morning. And 75m at night. Hold if pulse  < 60   MIRTAZAPINE (REMERON) 7.5 MG TABLET    Take 7.5 mg by mouth at bedtime.    NUTRITIONAL  SUPPLEMENTS (NUTRITIONAL SUPPLEMENT PO)    House Supplement - Give 120 ml by mouth three times daily   OXYCODONE (OXY IR/ROXICODONE) 5 MG IMMEDIATE RELEASE TABLET    Take 5 mg by mouth every 6 (six) hours as needed for severe pain.   PANTOPRAZOLE (PROTONIX) 40 MG TABLET    Take 40 mg by mouth daily.   SERTRALINE (ZOLOFT) 50 MG TABLET    Take 50 mg by mouth daily.   Modified Medications   No medications on file  Discontinued Medications   No medications on file    Review of Systems  Unable to perform ROS: Dementia (cognitive impairment)    Vitals:   09/06/16 0942  BP: 128/72  Pulse: 66  Resp: 20  Temp: 98.7 F (37.1 C)  SpO2: 98%  Weight: 125 lb (56.7 kg)  Height: 5' (1.524 m)   Body mass index is 24.41 kg/m.  Physical Exam  Constitutional: She appears well-developed.  Frail appearing in NAD, lying in bed resting but easily aroused  HENT:  Mouth/Throat: Oropharynx is clear and moist. No oropharyngeal exudate.  MMM; no oral thrush  Eyes: Pupils are equal, round, and reactive to light. No scleral icterus.  Blind in OU  Neck: Neck supple. Carotid bruit is not present. No tracheal deviation present.  Cardiovascular: Intact distal pulses.  An irregular rhythm present. Tachycardia present.  Exam reveals no gallop and no friction rub.   Murmur heard.  Systolic murmur is present with a grade of 1/6  Pulses:      Popliteal pulses are 2+ on the right side, and 2+ on the left side.       Dorsalis pedis pulses are 2+ on the right side, and 2+ on the left side.       Posterior tibial pulses are 2+ on the right side, and 2+ on the left side.  B/l groin dsg soaked with blood; no distal LE edema. No calf TTP  Pulmonary/Chest: Effort normal and breath sounds normal. No stridor. No respiratory distress. She has no wheezes. She has no rales.  Abdominal: Soft. Normal appearance and bowel sounds are normal. She exhibits no distension and no mass. There is no hepatomegaly. There is  no tenderness. There is no rigidity, no rebound and no guarding. No hernia.  Musculoskeletal: She exhibits edema and deformity (right hand contracturr).  Lymphadenopathy:    She has no cervical adenopathy.  Neurological: She is alert.  Right hemiparesis  Skin: Skin is warm and dry. No rash noted.  Wound dsg intact but soaked b/l inguinal  Psychiatric: She has a normal mood and affect. Her behavior is normal. Judgment and thought content normal.     Labs reviewed: Nursing Home on 09/03/2016  Component Date Value Ref Range Status  . Glucose 08/07/2016 98   Final  . BUN 08/07/2016 28* 4 - 21 Final  . Creatinine 08/07/2016 1.4* 0.5 - 1.1 Final  . Potassium 08/07/2016 4.2  3.4 - 5.3 Final  . Sodium 08/07/2016 142  137 - 147 Final  . Triglycerides 08/07/2016 145  40 - 160 Final  . Cholesterol 08/07/2016 141  0 - 200 Final  . HDL 08/07/2016 53  35 - 70 Final  . LDL Cholesterol 08/07/2016 59   Final  . Alkaline Phosphatase 08/07/2016 94  25 - 125 Final  . ALT 08/07/2016 10  7 - 35 Final  . AST 08/07/2016 14  13 - 35 Final  . Bilirubin, Total 08/07/2016 0.3   Final  Admission  on 08/29/2016, Discharged on 09/02/2016  Component Date Value Ref Range Status  . WBC 08/29/2016 8.3  4.0 - 10.5 K/uL Final  . RBC 08/29/2016 4.87  3.87 - 5.11 MIL/uL Final  . Hemoglobin 08/29/2016 13.0  12.0 - 15.0 g/dL Final  . HCT 08/29/2016 38.7  36.0 - 46.0 % Final  . MCV 08/29/2016 79.5  78.0 - 100.0 fL Final  . MCH 08/29/2016 26.7  26.0 - 34.0 pg Final  . MCHC 08/29/2016 33.6  30.0 - 36.0 g/dL Final  . RDW 08/29/2016 15.8* 11.5 - 15.5 % Final  . Platelets 08/29/2016 179  150 - 400 K/uL Final  . Neutrophils Relative % 08/29/2016 66  % Final  . Neutro Abs 08/29/2016 5.6  1.7 - 7.7 K/uL Final  . Lymphocytes Relative 08/29/2016 20  % Final  . Lymphs Abs 08/29/2016 1.7  0.7 - 4.0 K/uL Final  . Monocytes Relative 08/29/2016 10  % Final  . Monocytes Absolute 08/29/2016 0.8  0.1 - 1.0 K/uL Final  . Eosinophils  Relative 08/29/2016 3  % Final  . Eosinophils Absolute 08/29/2016 0.2  0.0 - 0.7 K/uL Final  . Basophils Relative 08/29/2016 1  % Final  . Basophils Absolute 08/29/2016 0.0  0.0 - 0.1 K/uL Final  . Sodium 08/29/2016 140  135 - 145 mmol/L Final  . Potassium 08/29/2016 4.2  3.5 - 5.1 mmol/L Final  . Chloride 08/29/2016 105  101 - 111 mmol/L Final  . CO2 08/29/2016 26  22 - 32 mmol/L Final  . Glucose, Bld 08/29/2016 132* 65 - 99 mg/dL Final  . BUN 08/29/2016 28* 6 - 20 mg/dL Final  . Creatinine, Ser 08/29/2016 1.20* 0.44 - 1.00 mg/dL Final  . Calcium 08/29/2016 9.8  8.9 - 10.3 mg/dL Final  . Total Protein 08/29/2016 7.4  6.5 - 8.1 g/dL Final  . Albumin 08/29/2016 3.6  3.5 - 5.0 g/dL Final  . AST 08/29/2016 18  15 - 41 U/L Final  . ALT 08/29/2016 15  14 - 54 U/L Final  . Alkaline Phosphatase 08/29/2016 90  38 - 126 U/L Final  . Total Bilirubin 08/29/2016 0.7  0.3 - 1.2 mg/dL Final  . GFR calc non Af Amer 08/29/2016 43* >60 mL/min Final  . GFR calc Af Amer 08/29/2016 50* >60 mL/min Final   Comment: (NOTE) The eGFR has been calculated using the CKD EPI equation. This calculation has not been validated in all clinical situations. eGFR's persistently <60 mL/min signify possible Chronic Kidney Disease.   . Anion gap 08/29/2016 9  5 - 15 Final  . Color, Urine 08/29/2016 YELLOW  YELLOW Final  . APPearance 08/29/2016 CLEAR  CLEAR Final  . Specific Gravity, Urine 08/29/2016 1.012  1.005 - 1.030 Final  . pH 08/29/2016 7.0  5.0 - 8.0 Final  . Glucose, UA 08/29/2016 NEGATIVE  NEGATIVE mg/dL Final  . Hgb urine dipstick 08/29/2016 SMALL* NEGATIVE Final  . Bilirubin Urine 08/29/2016 NEGATIVE  NEGATIVE Final  . Ketones, ur 08/29/2016 NEGATIVE  NEGATIVE mg/dL Final  . Protein, ur 08/29/2016 100* NEGATIVE mg/dL Final  . Nitrite 08/29/2016 NEGATIVE  NEGATIVE Final  . Leukocytes, UA 08/29/2016 TRACE* NEGATIVE Final  . RBC / HPF 08/29/2016 0-5  0 - 5 RBC/hpf Final  . WBC, UA 08/29/2016 0-5  0 - 5  WBC/hpf Final  . Bacteria, UA 08/29/2016 MANY* NONE SEEN Final  . Squamous Epithelial / LPF 08/29/2016 0-5* NONE SEEN Final  . Mucous 08/29/2016 PRESENT   Final  . Specimen  Description 08/29/2016 URINE, CLEAN CATCH   Final  . Special Requests 08/29/2016 NONE   Final  . Culture 08/29/2016 MULTIPLE SPECIES PRESENT, SUGGEST RECOLLECTION*  Final  . Report Status 08/29/2016 08/31/2016 FINAL   Final  . Prothrombin Time 08/30/2016 13.3  11.4 - 15.2 seconds Final  . INR 08/30/2016 1.01   Final  . MRSA by PCR 08/30/2016 NEGATIVE  NEGATIVE Final   Comment:        The GeneXpert MRSA Assay (FDA approved for NASAL specimens only), is one component of a comprehensive MRSA colonization surveillance program. It is not intended to diagnose MRSA infection nor to guide or monitor treatment for MRSA infections.   . ABO/RH(D) 08/30/2016 B POS   Final  . Antibody Screen 08/30/2016 NEG   Final  . Sample Expiration 08/30/2016 09/02/2016   Final  . Unit Number 08/30/2016 [id number]   Final  . Blood Component Type 08/30/2016 RED CELLS,LR   Final  . Unit division 08/30/2016 00   Final  . Status of Unit 08/30/2016 ISSUED,FINAL   Final  . Transfusion Status 08/30/2016 OK TO TRANSFUSE   Final  . Crossmatch Result 08/30/2016 Compatible   Final  . Unit Number 08/30/2016 [id number]   Final  . Blood Component Type 08/30/2016 RBC LR PHER2   Final  . Unit division 08/30/2016 00   Final  . Status of Unit 08/30/2016 ISSUED,FINAL   Final  . Transfusion Status 08/30/2016 OK TO TRANSFUSE   Final  . Crossmatch Result 08/30/2016 Compatible   Final  . Specimen Description 08/30/2016 TISSUE LEFT FEMORAL ARTERY   Final  . Special Requests 08/30/2016 GRAFT TISSUE POF VANC   Final  . Gram Stain 08/30/2016    Final                   Value:RARE WBC PRESENT, PREDOMINANTLY PMN NO ORGANISMS SEEN   . Culture 08/30/2016 No growth aerobically or anaerobically.   Final  . Report Status 08/30/2016 09/05/2016 FINAL   Final    . Glucose-Capillary 08/30/2016 191* 65 - 99 mg/dL Final  . Sodium 08/31/2016 138  135 - 145 mmol/L Final  . Potassium 08/31/2016 4.6  3.5 - 5.1 mmol/L Final  . Chloride 08/31/2016 108  101 - 111 mmol/L Final  . CO2 08/31/2016 21* 22 - 32 mmol/L Final  . Glucose, Bld 08/31/2016 192* 65 - 99 mg/dL Final  . BUN 08/31/2016 27* 6 - 20 mg/dL Final  . Creatinine, Ser 08/31/2016 1.59* 0.44 - 1.00 mg/dL Final  . Calcium 08/31/2016 8.6* 8.9 - 10.3 mg/dL Final  . GFR calc non Af Amer 08/31/2016 30* >60 mL/min Final  . GFR calc Af Amer 08/31/2016 35* >60 mL/min Final   Comment: (NOTE) The eGFR has been calculated using the CKD EPI equation. This calculation has not been validated in all clinical situations. eGFR's persistently <60 mL/min signify possible Chronic Kidney Disease.   . Anion gap 08/31/2016 9  5 - 15 Final  . Hgb A1c MFr Bld 08/30/2016 6.6* 4.8 - 5.6 % Final   Comment: (NOTE)         Pre-diabetes: 5.7 - 6.4         Diabetes: >6.4         Glycemic control for adults with diabetes: <7.0   . Mean Plasma Glucose 08/30/2016 143  mg/dL Final   Comment: (NOTE) Performed At: [facility] [place], [place] [id number] Lindon Romp MD YC:[id number]   . Glucose-Capillary 08/30/2016 223*  65 - 99 mg/dL Final  . Comment 1 08/30/2016 Capillary Specimen   Final  . Comment 2 08/30/2016 Notify RN   Final  . WBC 08/31/2016 12.2* 4.0 - 10.5 K/uL Final  . RBC 08/31/2016 2.78* 3.87 - 5.11 MIL/uL Final  . Hemoglobin 08/31/2016 7.3* 12.0 - 15.0 g/dL Final  . HCT 08/31/2016 22.3* 36.0 - 46.0 % Final  . MCV 08/31/2016 80.2  78.0 - 100.0 fL Final  . MCH 08/31/2016 26.3  26.0 - 34.0 pg Final  . MCHC 08/31/2016 32.7  30.0 - 36.0 g/dL Final  . RDW 08/31/2016 16.2* 11.5 - 15.5 % Final  . Platelets 08/31/2016 137* 150 - 400 K/uL Final  . Glucose-Capillary 08/31/2016 200* 65 - 99 mg/dL Final  . Comment 1 08/31/2016 Notify RN   Final  . Order Confirmation 08/31/2016 ORDER  PROCESSED BY BLOOD BANK   Final  . ISSUE DATE / TIME 08/30/2016 [id number]   Final  . Blood Product Unit Number 08/30/2016 [id number]   Final  . PRODUCT CODE 08/30/2016 [id number]   Final  . Unit Type and Rh 08/30/2016 7300   Final  . Blood Product Expiration Date 08/30/2016 [id number]   Final  . ISSUE DATE / TIME 08/30/2016 [id number]   Final  . Blood Product Unit Number 08/30/2016 [id number]   Final  . PRODUCT CODE 08/30/2016 [id number]   Final  . Unit Type and Rh 08/30/2016 7300   Final  . Blood Product Expiration Date 08/30/2016 [id number]   Final  . Glucose-Capillary 08/31/2016 132* 65 - 99 mg/dL Final  . Comment 1 08/31/2016 Notify RN   Final  . Glucose-Capillary 08/31/2016 61* 65 - 99 mg/dL Final  . Comment 1 08/31/2016 Notify RN   Final  . Sodium 09/01/2016 137  135 - 145 mmol/L Final  . Potassium 09/01/2016 4.0  3.5 - 5.1 mmol/L Final  . Chloride 09/01/2016 110  101 - 111 mmol/L Final  . CO2 09/01/2016 22  22 - 32 mmol/L Final  . Glucose, Bld 09/01/2016 134* 65 - 99 mg/dL Final  . BUN 09/01/2016 30* 6 - 20 mg/dL Final  . Creatinine, Ser 09/01/2016 1.44* 0.44 - 1.00 mg/dL Final  . Calcium 09/01/2016 8.8* 8.9 - 10.3 mg/dL Final  . GFR calc non Af Amer 09/01/2016 34* >60 mL/min Final  . GFR calc Af Amer 09/01/2016 40* >60 mL/min Final   Comment: (NOTE) The eGFR has been calculated using the CKD EPI equation. This calculation has not been validated in all clinical situations. eGFR's persistently <60 mL/min signify possible Chronic Kidney Disease.   . Anion gap 09/01/2016 5  5 - 15 Final  . WBC 09/01/2016 12.0* 4.0 - 10.5 K/uL Final  . RBC 09/01/2016 3.10* 3.87 - 5.11 MIL/uL Final  . Hemoglobin 09/01/2016 8.7* 12.0 - 15.0 g/dL Final  . HCT 09/01/2016 25.9* 36.0 - 46.0 % Final  . MCV 09/01/2016 83.5  78.0 - 100.0 fL Final  . MCH 09/01/2016 28.1  26.0 - 34.0 pg Final  . MCHC 09/01/2016 33.6  30.0 - 36.0 g/dL Final  . RDW 09/01/2016 16.3* 11.5 - 15.5 % Final  .  Platelets 09/01/2016 115* 150 - 400 K/uL Final   PLATELET COUNT CONFIRMED BY SMEAR  . Hemoglobin 08/31/2016 9.6* 12.0 - 15.0 g/dL Final   POST TRANSFUSION SPECIMEN  . HCT 08/31/2016 29.4* 36.0 - 46.0 % Final  . Glucose-Capillary 08/31/2016 90  65 - 99 mg/dL Final  . Comment 1 08/31/2016 Notify RN   Final  .  Glucose-Capillary 08/31/2016 232* 65 - 99 mg/dL Final  . Glucose-Capillary 09/01/2016 178* 65 - 99 mg/dL Final  . Glucose-Capillary 09/01/2016 134* 65 - 99 mg/dL Final  . Glucose-Capillary 09/01/2016 145* 65 - 99 mg/dL Final  . Sodium 09/02/2016 135  135 - 145 mmol/L Final  . Potassium 09/02/2016 3.8  3.5 - 5.1 mmol/L Final  . Chloride 09/02/2016 106  101 - 111 mmol/L Final  . CO2 09/02/2016 23  22 - 32 mmol/L Final  . Glucose, Bld 09/02/2016 151* 65 - 99 mg/dL Final  . BUN 09/02/2016 27* 6 - 20 mg/dL Final  . Creatinine, Ser 09/02/2016 1.28* 0.44 - 1.00 mg/dL Final  . Calcium 09/02/2016 9.0  8.9 - 10.3 mg/dL Final  . GFR calc non Af Amer 09/02/2016 40* >60 mL/min Final  . GFR calc Af Amer 09/02/2016 46* >60 mL/min Final   Comment: (NOTE) The eGFR has been calculated using the CKD EPI equation. This calculation has not been validated in all clinical situations. eGFR's persistently <60 mL/min signify possible Chronic Kidney Disease.   . Anion gap 09/02/2016 6  5 - 15 Final  . Glucose-Capillary 09/01/2016 177* 65 - 99 mg/dL Final  . Glucose-Capillary 09/02/2016 159* 65 - 99 mg/dL Final  . Glucose-Capillary 09/02/2016 137* 65 - 99 mg/dL Final  . Glucose-Capillary 09/02/2016 128* 65 - 99 mg/dL Final  Nursing Home on 08/06/2016  Component Date Value Ref Range Status  . Glucose 02/03/2016 101   Final  . BUN 02/03/2016 29* 4 - 21 Final  . Creatinine 02/03/2016 1.1  0.5 - 1.1 Final  . Potassium 02/03/2016 4.5  3.4 - 5.3 Final  . Sodium 02/03/2016 142  137 - 147 Final  . Alkaline Phosphatase 02/03/2016 76  25 - 125 Final  . ALT 02/03/2016 20  7 - 35 Final  . AST 02/03/2016 27  13 -  35 Final  . Bilirubin, Total 02/03/2016 0.3   Final  . TSH 06/26/2016 4.68  0.41 - 5.90 Final    Ct Abdomen Pelvis W Contrast  Result Date: 08/29/2016 CLINICAL DATA:  Initial evaluation for acute abdominal pain, left rolling mass. EXAM: CT ABDOMEN AND PELVIS WITH CONTRAST TECHNIQUE: Multidetector CT imaging of the abdomen and pelvis was performed using the standard protocol following bolus administration of intravenous contrast. CONTRAST:  156m ISOVUE-300 IOPAMIDOL (ISOVUE-300) INJECTION 61% COMPARISON:  Prior CT from 12/04/2011. FINDINGS: Lower chest: Scattered atelectatic changes noted within the visualized lung bases. Cardiomegaly partially visualized. No pleural or pericardial effusion. Hepatobiliary: Liver demonstrates a normal contrast enhanced appearance. Gallbladder within normal limits. No biliary dilatation. Pancreas: Pancreas somewhat atrophic but otherwise unremarkable without acute inflammation or mass lesion. Spleen: Subcentimeter hypodensity noted within central aspect of the spleen, of doubtful significance. Spleen otherwise unremarkable. Adrenals/Urinary Tract: Adrenal glands within normal limits. Kidneys relatively equal in size with symmetric enhancement. Scattered cortical thinning and scarring. 5.6 cm cyst noted extending from the lower pole left kidney. Additional scattered subcentimeter hypodensities within the bilateral kidneys too small the characterize, but statistically likely reflects small cysts as well. Multiple nonobstructive calculi present within the right kidney, largest of which positioned within the lower pole and measures approximately 15 mm. Mild right hydroureteronephrosis. There is an obstructive 6 mm calculus at the right UVJ (series 2, image 64). No left-sided renal calculi. No left-sided hydronephrosis or hydroureter. Bladder largely decompressed. Mild circumferential bladder wall thickening like related incomplete distension. Stomach/Bowel: Stomach within normal  limits. No evidence for bowel obstruction. Appendix within normal limits. No  acute inflammatory changes seen about the bowels. Large volume stool seen packed within the rectal vault, suggesting constipation. Vascular/Lymphatic: Extensive atherosclerosis throughout the intra-abdominal aorta and its branch vessels. Origin of the celiac axis, SMA, renal arteries, and IMA are grossly patent. Occlusion of the left common iliac just distal to its origin (series 2, image 42). Left internal iliac occluded proximally, with distal reconstitution likely via collateralization. Occluded vascular stent in place within the left external iliac artery. Extensive atherosclerotic disease throughout the right iliac system. Fem-fem bypass graft in place. That right common femoral anastomosis appears patent. Large pseudoaneurysm measuring 4.4 x 5.7 x 7.5 cm arises from the left common femoral anastomosis in the left inguinal region (series 2, image 63). Opacification of the left common femoral artery distally. Reproductive: Scattered calcified uterine fibroids noted. Uterus and ovaries otherwise within normal limits. Other: No free air or fluid. Musculoskeletal: Small intramuscular lipoma noted within the left abdominus musculature. No acute osseous abnormality. No worrisome lytic or blastic osseous lesions. IMPRESSION: 1. 4.4 x 5.7 x 7.5 cm pseudoaneurysm arising from the left aspect of a fem-fem bypass graft in the left inguinal region. Finding suggestive of graft failure. Superimposed infection not excluded. 2. Extensive atherosclerotic calcifications throughout the intra-abdominal aorta and its branch vessels. 3. 6 mm calculus at the right UVJ with secondary mild right hydroureteronephrosis. Additional nonobstructive right renal nephrolithiasis as above. 4. Large volume stool within the rectal vault, suggesting constipation. Critical Value/emergent results were called by telephone at the time of interpretation on 08/29/2016 at 11:29 pm  to Dr. Gareth Morgan , who verbally acknowledged these results. Electronically Signed   By: Jeannine Boga M.D.   On: 08/29/2016 23:34     Assessment/Plan   ICD-10-CM   1. Pain R52    uncontrolled; 2/2 recent vascular sx  2. 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 Atherosclerosis of native artery of both lower extremities with intermittent claudication ([place]) I70.213   4. Type 2 diabetes mellitus with diabetic neuropathy, without long-term current use of insulin (HCC) E11.40   5. Vascular dementia without behavioral disturbance F01.50   6. CKD stage 3 due to type 2 diabetes mellitus (HCC) E11.22    N18.3   7. Seizures ([place]) R56.9   8. Paroxysmal atrial fibrillation (HCC) I48.0   9. Gout of multiple sites, unspecified cause, unspecified chronicity M10.9   10. Hemiparesis affecting right side as late effect of stroke (HCC) I69.351     Will add short duration of ATC OxyIR 67m daily x 5 days only. Continue OxyIR [id number] prn pain. Hold for sedation.  Wound care as directed - contact vascular sx for orders  Cont other meds as ordered  PT/OT/ST as ordered  F/u vascular sx as scheduled  Cont nutritional supplements as ordered  GOAL: short term rehab then continue long term care. Communicated with pt and nursing.  Will follow  Liany Mumpower S. [facility] [address] [id number] (Monday-Friday 8 AM - 5 PM) ([phone] 5 PM and follow prompts

## 2016-09-07 ENCOUNTER — Emergency Department (HOSPITAL_COMMUNITY): Payer: Medicare Other

## 2016-09-07 ENCOUNTER — Encounter (HOSPITAL_COMMUNITY): Payer: Self-pay | Admitting: Emergency Medicine

## 2016-09-07 ENCOUNTER — Inpatient Hospital Stay (HOSPITAL_COMMUNITY)
Admission: EM | Admit: 2016-09-07 | Discharge: 2016-09-09 | DRG: 309 | Disposition: A | Discharge status: Skilled Nursing Facility | Payer: Medicare Other | Attending: Internal Medicine | Admitting: Internal Medicine

## 2016-09-07 ENCOUNTER — Non-Acute Institutional Stay (SKILLED_NURSING_FACILITY): Payer: Medicare Other | Admitting: Adult Health

## 2016-09-07 ENCOUNTER — Encounter: Payer: Self-pay | Admitting: Adult Health

## 2016-09-07 DIAGNOSIS — I482 Chronic atrial fibrillation: Principal | ICD-10-CM | POA: Diagnosis present

## 2016-09-07 DIAGNOSIS — R569 Unspecified convulsions: Secondary | ICD-10-CM | POA: Diagnosis present

## 2016-09-07 DIAGNOSIS — Z7982 Long term (current) use of aspirin: Secondary | ICD-10-CM

## 2016-09-07 DIAGNOSIS — Z833 Family history of diabetes mellitus: Secondary | ICD-10-CM

## 2016-09-07 DIAGNOSIS — I739 Peripheral vascular disease, unspecified: Secondary | ICD-10-CM | POA: Diagnosis present

## 2016-09-07 DIAGNOSIS — Z6824 Body mass index (BMI) 24.0-24.9, adult: Secondary | ICD-10-CM

## 2016-09-07 DIAGNOSIS — Z8673 Personal history of transient ischemic attack (TIA), and cerebral infarction without residual deficits: Secondary | ICD-10-CM

## 2016-09-07 DIAGNOSIS — I4891 Unspecified atrial fibrillation: Secondary | ICD-10-CM | POA: Diagnosis not present

## 2016-09-07 DIAGNOSIS — I724 Aneurysm of artery of lower extremity: Secondary | ICD-10-CM

## 2016-09-07 DIAGNOSIS — I4819 Other persistent atrial fibrillation: Secondary | ICD-10-CM

## 2016-09-07 DIAGNOSIS — E1151 Type 2 diabetes mellitus with diabetic peripheral angiopathy without gangrene: Secondary | ICD-10-CM | POA: Diagnosis present

## 2016-09-07 DIAGNOSIS — N179 Acute kidney failure, unspecified: Secondary | ICD-10-CM | POA: Diagnosis present

## 2016-09-07 DIAGNOSIS — I130 Hypertensive heart and chronic kidney disease with heart failure and stage 1 through stage 4 chronic kidney disease, or unspecified chronic kidney disease: Secondary | ICD-10-CM | POA: Diagnosis present

## 2016-09-07 DIAGNOSIS — I2510 Atherosclerotic heart disease of native coronary artery without angina pectoris: Secondary | ICD-10-CM | POA: Diagnosis present

## 2016-09-07 DIAGNOSIS — R079 Chest pain, unspecified: Secondary | ICD-10-CM | POA: Diagnosis not present

## 2016-09-07 DIAGNOSIS — E1140 Type 2 diabetes mellitus with diabetic neuropathy, unspecified: Secondary | ICD-10-CM | POA: Diagnosis present

## 2016-09-07 DIAGNOSIS — I509 Heart failure, unspecified: Secondary | ICD-10-CM | POA: Diagnosis present

## 2016-09-07 DIAGNOSIS — Z87891 Personal history of nicotine dependence: Secondary | ICD-10-CM

## 2016-09-07 DIAGNOSIS — Z880 Allergy status to penicillin: Secondary | ICD-10-CM

## 2016-09-07 DIAGNOSIS — Z9181 History of falling: Secondary | ICD-10-CM

## 2016-09-07 DIAGNOSIS — Z79891 Long term (current) use of opiate analgesic: Secondary | ICD-10-CM

## 2016-09-07 DIAGNOSIS — I493 Ventricular premature depolarization: Secondary | ICD-10-CM | POA: Diagnosis present

## 2016-09-07 DIAGNOSIS — I481 Persistent atrial fibrillation: Secondary | ICD-10-CM

## 2016-09-07 DIAGNOSIS — E1122 Type 2 diabetes mellitus with diabetic chronic kidney disease: Secondary | ICD-10-CM | POA: Diagnosis present

## 2016-09-07 DIAGNOSIS — D649 Anemia, unspecified: Secondary | ICD-10-CM | POA: Diagnosis present

## 2016-09-07 DIAGNOSIS — Z8249 Family history of ischemic heart disease and other diseases of the circulatory system: Secondary | ICD-10-CM

## 2016-09-07 DIAGNOSIS — R531 Weakness: Secondary | ICD-10-CM

## 2016-09-07 DIAGNOSIS — Z9221 Personal history of antineoplastic chemotherapy: Secondary | ICD-10-CM

## 2016-09-07 DIAGNOSIS — F0150 Vascular dementia without behavioral disturbance: Secondary | ICD-10-CM | POA: Diagnosis present

## 2016-09-07 DIAGNOSIS — R1084 Generalized abdominal pain: Secondary | ICD-10-CM | POA: Insufficient documentation

## 2016-09-07 DIAGNOSIS — N183 Chronic kidney disease, stage 3 (moderate): Secondary | ICD-10-CM | POA: Diagnosis present

## 2016-09-07 DIAGNOSIS — Z79899 Other long term (current) drug therapy: Secondary | ICD-10-CM

## 2016-09-07 DIAGNOSIS — E440 Moderate protein-calorie malnutrition: Secondary | ICD-10-CM | POA: Diagnosis present

## 2016-09-07 DIAGNOSIS — E7800 Pure hypercholesterolemia, unspecified: Secondary | ICD-10-CM | POA: Diagnosis present

## 2016-09-07 DIAGNOSIS — T148XXA Other injury of unspecified body region, initial encounter: Secondary | ICD-10-CM | POA: Diagnosis present

## 2016-09-07 DIAGNOSIS — E785 Hyperlipidemia, unspecified: Secondary | ICD-10-CM | POA: Diagnosis present

## 2016-09-07 DIAGNOSIS — Z823 Family history of stroke: Secondary | ICD-10-CM

## 2016-09-07 DIAGNOSIS — K219 Gastro-esophageal reflux disease without esophagitis: Secondary | ICD-10-CM | POA: Diagnosis present

## 2016-09-07 DIAGNOSIS — M109 Gout, unspecified: Secondary | ICD-10-CM | POA: Diagnosis present

## 2016-09-07 DIAGNOSIS — H548 Legal blindness, as defined in USA: Secondary | ICD-10-CM | POA: Diagnosis present

## 2016-09-07 DIAGNOSIS — Y838 Other surgical procedures as the cause of abnormal reaction of the patient, or of later complication, without mention of misadventure at the time of the procedure: Secondary | ICD-10-CM | POA: Diagnosis present

## 2016-09-07 DIAGNOSIS — I429 Cardiomyopathy, unspecified: Secondary | ICD-10-CM | POA: Diagnosis present

## 2016-09-07 DIAGNOSIS — S31104A Unspecified open wound of abdominal wall, left lower quadrant without penetration into peritoneal cavity, initial encounter: Secondary | ICD-10-CM | POA: Diagnosis present

## 2016-09-07 DIAGNOSIS — G4700 Insomnia, unspecified: Secondary | ICD-10-CM | POA: Diagnosis present

## 2016-09-07 LAB — BASIC METABOLIC PANEL
ANION GAP: 8 (ref 5–15)
BUN: 13 mg/dL (ref 6–20)
CALCIUM: 9.2 mg/dL (ref 8.9–10.3)
CO2: 27 mmol/L (ref 22–32)
Chloride: 106 mmol/L (ref 101–111)
Creatinine, Ser: 1.28 mg/dL — ABNORMAL HIGH (ref 0.44–1.00)
GFR, EST AFRICAN AMERICAN: 46 mL/min — AB (ref >60)
GFR, EST NON AFRICAN AMERICAN: 40 mL/min — AB (ref >60)
GLUCOSE: 279 mg/dL — AB (ref 65–99)
POTASSIUM: 3.8 mmol/L (ref 3.5–5.1)
SODIUM: 141 mmol/L (ref 135–145)

## 2016-09-07 LAB — CBC WITH DIFFERENTIAL/PLATELET
BASOS ABS: 0 10*3/uL (ref 0.0–0.1)
BASOS PCT: 0 %
Eosinophils Absolute: 0.1 10*3/uL (ref 0.0–0.7)
Eosinophils Relative: 1 %
HEMATOCRIT: 29.8 % — AB (ref 36.0–46.0)
Hemoglobin: 9.7 g/dL — ABNORMAL LOW (ref 12.0–15.0)
LYMPHS PCT: 10 %
Lymphs Abs: 1 10*3/uL (ref 0.7–4.0)
MCH: 27.7 pg (ref 26.0–34.0)
MCHC: 32.6 g/dL (ref 30.0–36.0)
MCV: 85.1 fL (ref 78.0–100.0)
Monocytes Absolute: 0.5 10*3/uL (ref 0.1–1.0)
Monocytes Relative: 5 %
NEUTROS ABS: 7.8 10*3/uL — AB (ref 1.7–7.7)
Neutrophils Relative %: 84 %
PLATELETS: 293 10*3/uL (ref 150–400)
RBC: 3.5 MIL/uL — AB (ref 3.87–5.11)
RDW: 16.5 % — AB (ref 11.5–15.5)
WBC: 9.4 10*3/uL (ref 4.0–10.5)

## 2016-09-07 LAB — GLUCOSE, CAPILLARY: GLUCOSE-CAPILLARY: 138 mg/dL — AB (ref 65–99)

## 2016-09-07 LAB — PROTIME-INR
INR: 1.17
PROTHROMBIN TIME: 15 s (ref 11.4–15.2)

## 2016-09-07 LAB — TROPONIN I
Troponin I: 0.03 ng/mL (ref <0.03)
Troponin I: 0.03 ng/mL (ref <0.03)
Troponin I: 0.03 ng/mL (ref <0.03)

## 2016-09-07 MED ORDER — SODIUM CHLORIDE 0.9 % IV SOLN: 250.0000 mL | INTRAVENOUS | Status: DC | PRN

## 2016-09-07 MED ORDER — DEXTROSE 5 % IV SOLN
1.0000 g | INTRAVENOUS | Status: DC
  Administered 2016-09-07 – 2016-09-08 (×2): 1 g via INTRAVENOUS
  Filled 2016-09-07 (×3): qty 1

## 2016-09-07 MED ORDER — INSULIN ASPART 100 UNIT/ML ~~LOC~~ SOLN: 0.0000 [IU] | Freq: Every day | SUBCUTANEOUS | Status: DC

## 2016-09-07 MED ORDER — IOPAMIDOL (ISOVUE-370) INJECTION 76%
INTRAVENOUS | Status: AC
  Administered 2016-09-07: 100 mL via INTRAVENOUS
  Filled 2016-09-07: qty 100

## 2016-09-07 MED ORDER — VANCOMYCIN HCL 500 MG IV SOLR
500.0000 mg | INTRAVENOUS | Status: DC
  Administered 2016-09-08: 500 mg via INTRAVENOUS
  Filled 2016-09-07 (×2): qty 500

## 2016-09-07 MED ORDER — VANCOMYCIN HCL IN DEXTROSE 1-5 GM/200ML-% IV SOLN
1000.0000 mg | Freq: Once | INTRAVENOUS | Status: AC
  Administered 2016-09-07: 1000 mg via INTRAVENOUS

## 2016-09-07 MED ORDER — SODIUM CHLORIDE 0.9% FLUSH: 3.0000 mL | INTRAVENOUS | Status: DC | PRN

## 2016-09-07 MED ORDER — METRONIDAZOLE IN NACL 5-0.79 MG/ML-% IV SOLN: 500.0000 mg | Freq: Once | INTRAVENOUS | Status: DC

## 2016-09-07 MED ORDER — ENOXAPARIN SODIUM 30 MG/0.3ML ~~LOC~~ SOLN
30.0000 mg | SUBCUTANEOUS | Status: DC
  Administered 2016-09-07 – 2016-09-08 (×2): 30 mg via SUBCUTANEOUS
  Filled 2016-09-07 (×2): qty 0.3

## 2016-09-07 MED ORDER — DEXTROSE 5 % IV SOLN: 2.0000 g | Freq: Once | INTRAVENOUS | Status: DC

## 2016-09-07 MED ORDER — INSULIN ASPART 100 UNIT/ML ~~LOC~~ SOLN
0.0000 [IU] | Freq: Three times a day (TID) | SUBCUTANEOUS | Status: DC
  Administered 2016-09-08 (×2): 1 [IU] via SUBCUTANEOUS
  Administered 2016-09-08: 3 [IU] via SUBCUTANEOUS
  Administered 2016-09-09 (×2): 2 [IU] via SUBCUTANEOUS

## 2016-09-07 MED ORDER — SODIUM CHLORIDE 0.9% FLUSH
3.0000 mL | Freq: Two times a day (BID) | INTRAVENOUS | Status: DC
  Administered 2016-09-07 – 2016-09-09 (×4): 3 mL via INTRAVENOUS

## 2016-09-07 MED ORDER — ACETAMINOPHEN 650 MG RE SUPP: 650.0000 mg | Freq: Four times a day (QID) | RECTAL | Status: DC | PRN

## 2016-09-07 MED ORDER — ACETAMINOPHEN 325 MG PO TABS
650.0000 mg | ORAL_TABLET | Freq: Four times a day (QID) | ORAL | Status: DC | PRN
  Administered 2016-09-08 (×2): 650 mg via ORAL
  Filled 2016-09-07 (×2): qty 2

## 2016-09-07 NOTE — Consult Note (Signed)
Hospital Consult    Reason for Consult:  Drainage left groin wound Referring Physician:  ED MRN #:  [id number]  History of Present Illness: This is a 76 y.o. female with recent operation in left groin for femoral psa. She was discharged post op with drainage from her wound but thought to be benign in nature. She is now in ED with continued drainage. History obtained from chart as she cannot recall any of this and no family is available. She does not have any complaints.   Past Medical History:  Diagnosis Date  . Abdominal or pelvic swelling, mass, or lump, left upper quadrant   . CHF (congestive heart failure) ([place])   . Chronic atrial fibrillation ([place])   . Chronic kidney disease   . Congestive heart failure, unspecified   . Coronary atherosclerosis of unspecified type of vessel, native or graft   . Diabetes mellitus   . Edema   . GERD (gastroesophageal reflux disease)   . Gout, unspecified   . Hypercholesterolemia   . Hypertension   . 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  . Peripheral vascular disease, unspecified ([place])   . Seizures ([place])   . Sinus of Valsalva aneurysm    a. By 2D echo 05/2011.  . Stroke ([place])   . Unspecified hereditary and idiopathic peripheral neuropathy   . Unspecified urinary incontinence   . Vascular dementia, uncomplicated     Past Surgical History:  Procedure Laterality Date  . AORTOGRAM  09/01/2008   Abd w/ bilateral lower extremity runoff arteriography  . APPLICATION OF WOUND VAC Left 08/30/2016   Procedure: APPLICATION OF WOUND VAC LEFT GROIN;  Surgeon: Serafina Mitchell, MD;  Location: [facility];  Service: Vascular;  Laterality: Left;  . ENDARTERECTOMY FEMORAL Left 08/30/2016   Procedure: LEFT COMMON FEMORAL ARTERY ENDARTERECTOMY;  Surgeon: Serafina Mitchell, MD;  Location: [facility];  Service: Vascular;   Laterality: Left;  . FALSE ANEURYSM REPAIR  12/06/2011   Procedure: REPAIR FALSE ANEURYSM;  Surgeon: Angelia Mould, MD;  Location: [facility] OR;  Service: Vascular;  Laterality: Left;  Repair of left femoral Artery pseudoaneurysm  . FALSE ANEURYSM REPAIR Left 08/30/2016   Procedure: REPAIR PSEUDOANEURYSM LEFT GROIN;  Surgeon: Serafina Mitchell, MD;  Location: [place];  Service: Vascular;  Laterality: Left;  . FEMORAL ARTERY EXPLORATION  12/06/2011   Procedure: FEMORAL ARTERY EXPLORATION;  Surgeon: Angelia Mould, MD;  Location: [facility] OR;  Service: Vascular;  Laterality: N/A;  Exploration of large pseudoaneurysm left side of fem-fem bypass graft   . femoral to femoral bypass graft     . FEMORAL-FEMORAL BYPASS GRAFT  12/06/2011   Procedure: BYPASS GRAFT FEMORAL-FEMORAL ARTERY;  Surgeon: Angelia Mould, MD;  Location: [facility] OR;  Service: Vascular;  Laterality: Bilateral;  Revision of left to right Femoral-Femoral bypass graft  . FEMORAL-FEMORAL BYPASS GRAFT Left 08/30/2016   Procedure: REDO RIGHT TO LEFT FEMORAL-FEMORAL ARTERY BYPASS GRAFT;  Surgeon: Serafina Mitchell, MD;  Location: [place];  Service: Vascular;  Laterality: Left;  . FEMORAL-POPLITEAL BYPASS GRAFT Left 05/2002   lower extremity femoral to below knee w/ non-versed greater saphenous vein  . FEMORAL-POPLITEAL BYPASS GRAFT Left 11/2002  . FEMORAL-POPLITEAL BYPASS GRAFT Left 08/30/2016   Procedure: REVISION OF LEFT FEMORAL-POPLITEAL ARTERY BYPASS GRAFT;  Surgeon: Serafina Mitchell, MD;  Location: [place];  Service: Vascular;  Laterality: Left;  . THROMBECTOMY FEMORAL ARTERY Left 08/30/2016   Procedure: THROMBECTOMY OF FEMORAL-FEMORAL ARTERY BYPASS GRAFT AND LEFT FEMORAL TO LEFT POPLITEAL BYPASS GRAFT;  Surgeon: Serafina Mitchell, MD;  Location: [place];  Service: Vascular;  Laterality: Left;  . TUBAL LIGATION Bilateral     Allergies  Allergen Reactions  . Penicillins Other (See Comments)    Patient states that she was told previously by a doctor  to not take this medication but is unsure why.     Prior to Admission medications   Medication Sig Start Date End Date Taking? Authorizing Provider  acetaminophen (TYLENOL) 325 MG tablet Take 2 tablets (650 mg total) by mouth every 6 (six) hours as needed for mild pain (or Fever >/= 101). 09/02/16   Osei-Bonsu, Iona Beard, MD  allopurinol (ZYLOPRIM) 100 MG tablet Take 100 mg by mouth daily.    [provider]  Amino Acids-Protein Hydrolys (FEEDING SUPPLEMENT, PRO-STAT SUGAR FREE 64,) LIQD Take 30 mLs by mouth 2 (two) times daily.     [provider]  amLODipine (NORVASC) 2.5 MG tablet Take 2.5 mg by mouth daily.    [provider]  aspirin 81 MG tablet Take 81 mg by mouth daily.    [provider]  atorvastatin (LIPITOR) 10 MG tablet Take 10 mg by mouth at bedtime.     [provider]  CETYLPYRIDINIUM CHLORIDE MT Give 15 ml by mouth two times a day for oral irritation    [provider]  donepezil (ARICEPT) 10 MG tablet Take 1 tablet (10 mg total) by mouth at bedtime. 02/07/13   Isaac Bliss, Rayford Halsted, MD  guaiFENesin-dextromethorphan (ROBITUSSIN DM) 100-10 MG/5ML syrup Take 15 mLs by mouth every 4 (four) hours as needed for cough. 09/02/16   Benito Mccreedy, MD  hydrALAZINE (APRESOLINE) 25 MG tablet Take 1 tablet (25 mg total) by mouth every 6 (six) hours as needed (SBP > 170). 09/02/16   Osei-Bonsu, Iona Beard, MD  levETIRAcetam (KEPPRA) 250 MG tablet Take 250 mg by mouth 2 (two) times daily.    [provider]  levofloxacin (LEVAQUIN) 250 MG tablet Take 1 tablet (250 mg total) by mouth daily. 09/02/16 09/12/16  Benito Mccreedy, MD  metoprolol tartrate (LOPRESSOR) 25 MG tablet Take 50 mg by mouth every morning. And 25mg  at night. Hold if pulse  < 60    [provider]  mirtazapine (REMERON) 7.5 MG tablet Take 7.5 mg by mouth at bedtime.    [provider]  Nutritional Supplements (NUTRITIONAL SUPPLEMENT PO) House Supplement  - Give 120 ml by mouth three times daily    [provider]  oxyCODONE (OXY IR/ROXICODONE) 5 MG immediate release tablet Take 1 tablet (5 mg total) by mouth every 6 (six) hours as needed for severe pain. 09/06/16   Gerlene Fee, NP  oxyCODONE (ROXICODONE) 5 MG immediate release tablet Take one tablet by mouth daily for 5 days 09/06/16   Gildardo Cranker, DO  pantoprazole (PROTONIX) 40 MG tablet Take 40 mg by mouth daily.    [provider]  sertraline (ZOLOFT) 50 MG tablet Take 50 mg by mouth daily.     [provider]    Social History   Social History  . Marital status: Widowed    Spouse name: N/A  . Number of children: N/A  . Years of education: N/A   Occupational History  . Not on file.   Social History Main Topics  . Smoking status: Former Smoker  Types: Cigarettes    Quit date: 01/29/2001  . Smokeless tobacco: Never Used  . Alcohol use No  . Drug use: No  . Sexual activity: No   Other Topics Concern  . Not on file   Social History Narrative  . No narrative on file     Family History  Problem Relation Age of Onset  . Cancer Mother        Cervical  . Stroke Mother   . Diabetes Mother   . Stroke Father   . Heart disease Father     ROS: she does not have any complaints   Physical Examination  Vitals:   09/07/16 1145 09/07/16 1526  BP: (!) 135/96 (!) 146/96  Pulse: 64 93  Resp: (!) 28 18  Temp:    SpO2: 100% 100%   There is no height or weight on file to calculate BMI.  General:  NAD HENT: WNL, normocephalic Pulmonary: normal non-labored breathing Cardiac: palpable femoral pulses bilaterally Strong signals left dp/pt  Abdomen: soft, NT/ND, no masses Extremities: left femoral incision with drainage of serosanguinous fluid through staples Neurologic: alert  CBC    Component Value Date/Time   WBC 9.4 09/07/2016 1136   RBC 3.50 (L) 09/07/2016 1136   HGB 9.7 (L) 09/07/2016 1136   HCT 29.8 (L) 09/07/2016 1136   PLT 293  09/07/2016 1136   MCV 85.1 09/07/2016 1136   MCH 27.7 09/07/2016 1136   MCHC 32.6 09/07/2016 1136   RDW 16.5 (H) 09/07/2016 1136   LYMPHSABS 1.0 09/07/2016 1136   MONOABS 0.5 09/07/2016 1136   EOSABS 0.1 09/07/2016 1136   BASOSABS 0.0 09/07/2016 1136    BMET    Component Value Date/Time   NA 141 09/07/2016 1136   NA 139 09/04/2016   K 3.8 09/07/2016 1136   CL 106 09/07/2016 1136   CO2 27 09/07/2016 1136   GLUCOSE 279 (H) 09/07/2016 1136   BUN 13 09/07/2016 1136   BUN 14 09/04/2016   CREATININE 1.28 (H) 09/07/2016 1136   CALCIUM 9.2 09/07/2016 1136   GFRNONAA 40 (L) 09/07/2016 1136   GFRAA 46 (L) 09/07/2016 1136    COAGS: Lab Results  Component Value Date   INR 1.17 09/07/2016   INR 1.01 08/30/2016   INR 1.59 (H) 02/07/2013     Non-Invasive Vascular Imaging:   CTA reviewed without evidence of bleeding. Formal read pending.   ASSESSMENT/PLAN: This is a 76 y.o. female s/p                          #1: Repair of left femoral pseudoaneurysm                         #2: Redo left femoral artery exposure                         #3: Revision of right to left femoral-femoral bypass graft with interposition 8 mm dacron                         #4: Revision of left femoral-popliteal vein bypass graft with interposition Gore-Tex graft                         #5: Ligation of left common femoral artery                         #  6: Left femoral endarterectomy                         #7: Thrombectomy of left femoral popliteal bypass graft                         #8: Thrombectomy of right to left femoral-femoral bypass graft                         #9:  Application of wound VAC                                         rec admission to medical service for iv abx. Will re-evaluate wound tomorrow and possibly place wound vac.   Maeleigh Buschman C. Donzetta Matters, MD [facility] Office: [phone] Pager: [phone]

## 2016-09-07 NOTE — Progress Notes (Signed)
Location:   [facility] Room Number: 106 A Place of Service:  SNF (31)   CODE STATUS: Full Code  Allergies  Allergen Reactions  . Penicillins Other (See Comments)    Patient states that she was told previously by a doctor to not take this medication but is unsure why.     Chief Complaint  Patient presents with  . Acute Visit    change in status    HPI:  Staff reports this AM her pulse rate in the 120s. She is not in acute distress. She is unable to fully participate in the hpi or ros; but does have pain in her surgical area and is having chest pain. Staff reports that yesterday she had excessive bleeding for her surgical wound. There is a firm area under surgical which could represent a hematoma; when palpated blood does ooze out of the surgical line.    Past Medical History:  Diagnosis Date  . Abdominal or pelvic swelling, mass, or lump, left upper quadrant   . CHF (congestive heart failure) ([place])   . Chronic atrial fibrillation ([place])   . Chronic kidney disease   . Congestive heart failure, unspecified   . Coronary atherosclerosis of unspecified type of vessel, native or graft   . Diabetes mellitus   . Edema   . GERD (gastroesophageal reflux disease)   . Gout, unspecified   . Hypercholesterolemia   . Hypertension   . 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  . Peripheral vascular disease, unspecified ([place])   . Seizures ([place])   . Sinus of Valsalva aneurysm    a. By 2D echo 05/2011.  . Stroke ([place])   . Unspecified hereditary and idiopathic peripheral neuropathy   . Unspecified urinary incontinence   . Vascular dementia, uncomplicated     Past Surgical History:  Procedure Laterality Date  . AORTOGRAM  09/01/2008   Abd w/ bilateral lower extremity runoff arteriography  . APPLICATION OF WOUND VAC Left  08/30/2016   Procedure: APPLICATION OF WOUND VAC LEFT GROIN;  Surgeon: Serafina Mitchell, MD;  Location: [facility];  Service: Vascular;  Laterality: Left;  . ENDARTERECTOMY FEMORAL Left 08/30/2016   Procedure: LEFT COMMON FEMORAL ARTERY ENDARTERECTOMY;  Surgeon: Serafina Mitchell, MD;  Location: [facility];  Service: Vascular;  Laterality: Left;  . FALSE ANEURYSM REPAIR  12/06/2011   Procedure: REPAIR FALSE ANEURYSM;  Surgeon: Angelia Mould, MD;  Location: [facility] OR;  Service: Vascular;  Laterality: Left;  Repair of left femoral Artery pseudoaneurysm  . FALSE ANEURYSM REPAIR Left 08/30/2016   Procedure: REPAIR PSEUDOANEURYSM LEFT GROIN;  Surgeon: Serafina Mitchell, MD;  Location: [place];  Service: Vascular;  Laterality: Left;  . FEMORAL ARTERY EXPLORATION  12/06/2011   Procedure: FEMORAL ARTERY EXPLORATION;  Surgeon: Angelia Mould, MD;  Location: [facility] OR;  Service: Vascular;  Laterality: N/A;  Exploration of large pseudoaneurysm left side of fem-fem bypass graft   . femoral to femoral bypass graft     . FEMORAL-FEMORAL BYPASS GRAFT  12/06/2011   Procedure: BYPASS GRAFT FEMORAL-FEMORAL ARTERY;  Surgeon: Angelia Mould, MD;  Location: [facility] OR;  Service: Vascular;  Laterality: Bilateral;  Revision of left to right Femoral-Femoral bypass graft  . FEMORAL-FEMORAL BYPASS GRAFT Left 08/30/2016   Procedure: REDO RIGHT TO LEFT FEMORAL-FEMORAL ARTERY BYPASS GRAFT;  Surgeon: Serafina Mitchell, MD;  Location: [facility] OR;  Service: Vascular;  Laterality: Left;  . FEMORAL-POPLITEAL BYPASS GRAFT Left 05/2002   lower extremity femoral to below knee w/ non-versed greater saphenous vein  . FEMORAL-POPLITEAL BYPASS GRAFT Left 11/2002  . FEMORAL-POPLITEAL BYPASS GRAFT Left 08/30/2016   Procedure: REVISION OF LEFT FEMORAL-POPLITEAL ARTERY BYPASS GRAFT;  Surgeon: Serafina Mitchell, MD;  Location: [place];  Service: Vascular;  Laterality: Left;  . THROMBECTOMY FEMORAL ARTERY Left 08/30/2016   Procedure: THROMBECTOMY OF FEMORAL-FEMORAL  ARTERY BYPASS GRAFT AND LEFT FEMORAL TO LEFT POPLITEAL BYPASS GRAFT;  Surgeon: Serafina Mitchell, MD;  Location: [place];  Service: Vascular;  Laterality: Left;  . TUBAL LIGATION Bilateral     Social History   Social History  . Marital status: Widowed    Spouse name: N/A  . Number of children: N/A  . Years of education: N/A   Occupational History  . Not on file.   Social History Main Topics  . Smoking status: Former Smoker    Types: Cigarettes    Quit date: 01/29/2001  . Smokeless tobacco: Never Used  . Alcohol use No  . Drug use: No  . Sexual activity: No   Other Topics Concern  . Not on file   Social History Narrative  . No narrative on file   Family History  Problem Relation Age of Onset  . Cancer Mother        Cervical  . Stroke Mother   . Diabetes Mother   . Stroke Father   . Heart disease Father       VITAL SIGNS BP (!) 142/80   Pulse (!) 120   Temp 97.7 F (36.5 C)   Resp 12   Ht 5' (1.524 m)   Wt 125 lb (56.7 kg)   SpO2 92%   BMI 24.41 kg/m   Patient's Medications  New Prescriptions   No medications on file  Previous Medications   ACETAMINOPHEN (TYLENOL) 325 MG TABLET    Take 2 tablets (650 mg total) by mouth every 6 (six) hours as needed for mild pain (or Fever >/= 101).   ALLOPURINOL (ZYLOPRIM) 100 MG TABLET    Take 100 mg by mouth daily.   AMINO ACIDS-PROTEIN HYDROLYS (FEEDING SUPPLEMENT, PRO-STAT SUGAR FREE 64,) LIQD    Take 30 mLs by mouth 2 (two) times daily.    AMLODIPINE (NORVASC) 2.5 MG TABLET    Take 2.5 mg by mouth daily.   ASPIRIN 81 MG TABLET    Take 81 mg by mouth daily.   ATORVASTATIN (LIPITOR) 10 MG TABLET    Take 10 mg by mouth at bedtime.    CETYLPYRIDINIUM CHLORIDE MT    Give 15 ml by mouth two times a day for oral irritation   DONEPEZIL (ARICEPT) 10 MG TABLET    Take 1 tablet (10 mg total) by mouth at bedtime.   GUAIFENESIN-DEXTROMETHORPHAN (ROBITUSSIN DM) 100-10 MG/5ML SYRUP    Take 15 mLs by mouth every 4 (four) hours as  needed for cough.   HYDRALAZINE (APRESOLINE) 25 MG TABLET    Take 1 tablet (25 mg total) by mouth every 6 (six) hours as needed (SBP > 170).   LEVETIRACETAM (KEPPRA) 250 MG TABLET    Take 250 mg by mouth 2 (two) times daily.   LEVOFLOXACIN (LEVAQUIN) 250 MG TABLET    Take 1 tablet (250 mg total) by mouth daily.   METOPROLOL TARTRATE (LOPRESSOR) 25 MG TABLET    Take 50 mg by mouth every morning. And 25mg   at night. Hold if pulse  < 60   MIRTAZAPINE (REMERON) 7.5 MG TABLET    Take 7.5 mg by mouth at bedtime.   NUTRITIONAL SUPPLEMENTS (NUTRITIONAL SUPPLEMENT PO)    House Supplement - Give 120 ml by mouth three times daily   OXYCODONE (OXY IR/ROXICODONE) 5 MG IMMEDIATE RELEASE TABLET    Take 1 tablet (5 mg total) by mouth every 6 (six) hours as needed for severe pain.   OXYCODONE (ROXICODONE) 5 MG IMMEDIATE RELEASE TABLET    Take one tablet by mouth daily for 5 days   PANTOPRAZOLE (PROTONIX) 40 MG TABLET    Take 40 mg by mouth daily.   SERTRALINE (ZOLOFT) 50 MG TABLET    Take 50 mg by mouth daily.   Modified Medications   No medications on file  Discontinued Medications   No medications on file     SIGNIFICANT DIAGNOSTIC EXAMS   PREVIOUS   05-21-14: ct of chest: 1. Right upper lobe 5 mm nodule unchanged. 2. Additional nodules identified, some of which are noncalcified andvalso warrants follow-up. 3. Persistent mediastinal and hilar adenopathy. This may bevlong-standing given the previous reports of adenopathy. At the [facility] next exam, consider contrast-enhanced exam for further evaluation of the lymph nodes. 4. Given patient's history of smoking, follow-up chest CT at 6-12vmonths is recommended. 5. Coronary artery disease. . 01-10-15: ct of chest; 1. No acute findings. 2. Stable lung nodules as detailed above, the largest in the right upper lobe measuring 5 mm. Recommend additional unenhanced chest CT follow-up and 18 months per Fleischner criteria for following pulmonary nodules.  08-29-16:  ct of abdomen and pelvis: 1. 4.4 x 5.7 x 7.5 cm pseudoaneurysm arising from the left aspect of a fem-fem bypass graft in the left inguinal region. Finding suggestive of graft failure. Superimposed infection not excluded. 2. Extensive atherosclerotic calcifications throughout the intra-abdominal aorta and its branch vessels. 3. 6 mm calculus at the right UVJ with secondary mild right hydroureteronephrosis. Additional nonobstructive right renal nephrolithiasis as above. 4. Large volume stool within the rectal vault, suggesting constipation.  08-31-16: ABI:  Unable to obtain right ABI due to non-detectable waveforms. Left ABI of 0.43 is suggestive of severe arterial occlusive disease at rest. Unable to obtain bilateral TBI&'s due to low amplitude waveforms. Other specific details can be found in the table(s) above.  NO NEW EXAMS   LABS REVIEWED: PREVIOUS     09-14-15: hgb a1c 5.6 09-20-15: glucose 85; bun 26.8; creat 1.04; k+ 4.5; na++144; mag 1.7; dig 0.68  11-10-15; wbc 6.7 hgb 12.2; hct 37.3; mcv 86.0; plt 209; gucose 83; bun 24.1; creat 1.24; k+ 5.5; na++ 144; (creat clear 31.49); liver normal albumin 3.8; chol 117; ldl 41; trig 131; hdl 50   Urine micro-albumin <1.2  02-03-16: wbc 6.5; hgb 11.8; hct 38.9;mcv 92.7; plt 171; glucose 101; bun 29.2; creat 1.14; k+ 4.5; na++ 147; ca 10.4; (creat clear 34.25); liver normal albumin 3.8  05-16-16: wbc 6.9; hgb 11.7; ht 38.3; mcv 87.8; plt 186; glucose 107; bun 32.2; creat 1.08; k+ 4.4; na++ 142; ca 9.8; hgb a1c 6.9  06-26-16: tsh 4.68; dig <0.40 08-29-16: wbc 8.3; hgb 13.0; hct 38.7; mcv 79.5; plt 179; glucose 132; bun 28; creat 1.20; k+ 4.2; na++ 140; ca 9.8; liver normal albumin 3.6; urine culture; multiple bacteria   08-30-16: hgb a1c 6.6 09-01-16: wbc 12.0; hgb 8.7; hct 25.9; mcv 83.5; plt 115; glucose 134; bun 30; creat 1.44; k+ 4.0; na++ 138; ca 8.8   NO  NEW LABS   Review of Systems  Unable to perform ROS: Dementia (unable to answer questions fully )     Physical Exam  Constitutional: No distress.  Frail   HENT:  Is blind   Eyes: Conjunctivae are normal.  Neck: Neck supple. No JVD present. No thyromegaly present.  Cardiovascular: Intact distal pulses.   Is tachycardia Heart rate irregular   Respiratory: Effort normal. No respiratory distress. She has wheezes.  Placed on 02   GI: Soft. Bowel sounds are normal. She exhibits no distension. There is no tenderness.  Musculoskeletal: She exhibits no edema.  Left hemiparesis   Lymphadenopathy:    She has no cervical adenopathy.  Neurological: She is alert.  Skin: Skin is warm and dry. She is not diaphoretic.  Left lower abdomen and groin staples intact Left groin cath site intact Right lower abdomen incision line All without signs of infection  Does have bleeding present from left lower abdominal incision    There is a firm area under her incision line   Psychiatric: She has a normal mood and affect.     ASSESSMENT/ PLAN:  1. afib  2. Pseudoaneurysm 3.  Acute chest pain:   Will send her to the ED for further evaluation and treatment options. We can IVF; lab work and x rays and ultrasounds at this facility; to help avoid re-hospitalization   Time spent with patient during this acute illness: 40 minutes; to include reviewing her medical record; labs and coordination of care.   MD is aware of resident's narcotic use and is in agreement with current plan of care. We will attempt to wean resident as apropriate   Ok Edwards NP [facility]  Contact [phone] Monday through Friday 8am- 5pm  After hours call [phone]

## 2016-09-07 NOTE — ED Notes (Signed)
Bilateral groin dressings clean, dry and intact. MD examined surgical sites- no purulent drainage, sites appear to be healing. One small area of bloody drainage, will apply "quick clot" to site per verbal order from MD.

## 2016-09-07 NOTE — ED Notes (Signed)
CRITICAL VALUE ALERT  Critical Value:  Troponin 0.03  Date & Time Notied:  09/07/2016 1225   Provider Notified: Dr. Laverta Baltimore  Orders Received/Actions taken: No further orders at this time.

## 2016-09-07 NOTE — Care Management Note (Signed)
Case Management Note  Patient Details  Name: JACOYA BAUMAN MRN: [id number] Date of Birth: 1940-03-24  Subjective/Objective:              Patient brought in by [facility] from [facility] with AFib RVR 140-180 as per ambulance. Patient has hx of dementia.  Action/Plan: CM contacted son Melody Haver [phone] concerning patient coming into hospital under observation status son verbalizes understanding regarding obs status. Patient will return to [facility] once medically cleared. CM will consult CSW as well.     Expected Discharge Date:                  Expected Discharge Plan:  Skilled Nursing Facility  In-House Referral:  Clinical Social Work  Discharge planning Services  CM Consult  Post Acute Care Choice:  NA Choice offered to:     DME Arranged:    DME Agency:     HH Arranged:    [place] Agency:     Status of Service:  In process, will continue to follow  If discussed at Long Length of Stay Meetings, dates discussed:    Additional CommentsLaurena Slimmer, RN 09/07/2016, 5:33 PM

## 2016-09-07 NOTE — ED Notes (Signed)
Attempted report x1. 

## 2016-09-07 NOTE — ED Provider Notes (Signed)
Emergency Department Provider Note   I have reviewed the triage vital signs and the nursing notes.   HISTORY  Chief Complaint Atrial Fibrillation   HPI Casey [name] is a 76 y.o. female with PMH of dementia, blindness, DM, chronic a-fib, CHF, HLD, HTN, and recent fem-pop bypass presents to the ED from SNF with tachycardia and bleeding from her post-op site. Patient was admitted recently and developed a pseudoaneurysm which led to revision of the femorofemoral bypass. Wound cultures from that admission are negative. The patient is known to have chronic A. Fib. She has dementia and is unable to give me a history surrounding the events. She denies being in pain.  Per EMS, the nursing facility record heart rates in the 140s to 180s this morning. There is some concern for chest discomfort along with those symptoms and bleeding from the left inguinal incision. There is a note in Epic which described excessive bleeding from the surgical site on the left. Revision was performed on 08/30/2016 by Dr. Trula Slade. The [place] Curia of the note this AM reports feeling a firm mass under the surgical incision on the left with some oozing.   EMS gave 200 ml of IVF en route and tachycardia resolved.   Level 5 caveat: Dementia.    Past Medical History:  Diagnosis Date  . Abdominal or pelvic swelling, mass, or lump, left upper quadrant   . CHF (congestive heart failure) ([place])   . Chronic atrial fibrillation ([place])   . Chronic kidney disease   . Congestive heart failure, unspecified   . Coronary atherosclerosis of unspecified type of vessel, native or graft   . Diabetes mellitus   . Edema   . GERD (gastroesophageal reflux disease)   . Gout, unspecified   . Hypercholesterolemia   . Hypertension   . 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 Lymphoma (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  . Peripheral  vascular disease, unspecified ([place])   . Seizures ([place])   . Sinus of Valsalva aneurysm    a. By 2D echo 05/2011.  . Stroke ([place])   . Unspecified hereditary and idiopathic peripheral neuropathy   . Unspecified urinary incontinence   . Vascular dementia, uncomplicated     Patient Active Problem List   Diagnosis Date Noted  . Acute generalized abdominal pain 09/07/2016  . Acute chest pain 09/07/2016  . Atrial fibrillation with rapid ventricular response ([place]) 09/07/2016  . Wound discharge 09/07/2016  . Anemia 09/07/2016  . Femoral-femoral bypass graft thrombosis, left ([place]) 09/03/2016  . Pseudoaneurysm of femoral artery ([place]) 08/30/2016  . Vascular dementia without behavioral disturbance 08/29/2016  . History of CVA with residual deficit 08/29/2016  . Type 2 diabetes mellitus with diabetic neuropathy, without Gennaro Lizotte-term current use of insulin ([place]) 08/29/2016  . Depression 04/24/2016  . Loss of weight 04/24/2016  . Hypertensive heart disease with congestive heart failure ([place]) 12/30/2014  . Dyslipidemia associated with type 2 diabetes mellitus ([place]) 12/30/2014  . CKD stage 3 due to type 2 diabetes mellitus ([place]) 12/06/2014  . 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 Chronic systolic congestive heart failure ([place]) 02/09/2013  . Solitary pulmonary nodule 02/09/2013  . Seizures ([place])   . PVD (peripheral vascular disease) ([place]) 09/30/2012  . Gout of multiple sites 05/06/2012  . Dementia without behavioral disturbance 05/06/2012  . Atherosclerosis of native artery of extremity with intermittent claudication ([place]) 02/20/2012  . Type 2  diabetes mellitus with diabetic neuropathy ([place]) 12/29/2010  . Atrial fibrillation ([place])   . Late effects of CVA (cerebrovascular accident)     Past Surgical History:  Procedure Laterality Date  . AORTOGRAM  09/01/2008   Abd w/ bilateral lower extremity runoff arteriography  . APPLICATION OF WOUND VAC Left 08/30/2016   Procedure: APPLICATION OF WOUND VAC LEFT GROIN;   Surgeon: Serafina Mitchell, MD;  Location: [place];  Service: Vascular;  Laterality: Left;  . ENDARTERECTOMY FEMORAL Left 08/30/2016   Procedure: LEFT COMMON FEMORAL ARTERY ENDARTERECTOMY;  Surgeon: Serafina Mitchell, MD;  Location: [place];  Service: Vascular;  Laterality: Left;  . FALSE ANEURYSM REPAIR  12/06/2011   Procedure: REPAIR FALSE ANEURYSM;  Surgeon: Angelia Mould, MD;  Location: [facility] OR;  Service: Vascular;  Laterality: Left;  Repair of left femoral Artery pseudoaneurysm  . FALSE ANEURYSM REPAIR Left 08/30/2016   Procedure: REPAIR PSEUDOANEURYSM LEFT GROIN;  Surgeon: Serafina Mitchell, MD;  Location: [place];  Service: Vascular;  Laterality: Left;  . FEMORAL ARTERY EXPLORATION  12/06/2011   Procedure: FEMORAL ARTERY EXPLORATION;  Surgeon: Angelia Mould, MD;  Location: [facility] OR;  Service: Vascular;  Laterality: N/A;  Exploration of large pseudoaneurysm left side of fem-fem bypass graft   . femoral to femoral bypass graft     . FEMORAL-FEMORAL BYPASS GRAFT  12/06/2011   Procedure: BYPASS GRAFT FEMORAL-FEMORAL ARTERY;  Surgeon: Angelia Mould, MD;  Location[facility] OR;  Service: Vascular;  Laterality: Bilateral;  Revision of left to right Femoral-Femoral bypass graft  . FEMORAL-FEMORAL BYPASS GRAFT Left 08/30/2016   Procedure: REDO RIGHT TO LEFT FEMORAL-FEMORAL ARTERY BYPASS GRAFT;  Surgeon: Serafina Mitchell, MD;  Location: [place];  Service: Vascular;  Laterality: Left;  . FEMORAL-POPLITEAL BYPASS GRAFT Left 05/2002   lower extremity femoral to below knee w/ non-versed greater saphenous vein  . FEMORAL-POPLITEAL BYPASS GRAFT Left 11/2002  . FEMORAL-POPLITEAL BYPASS GRAFT Left 08/30/2016   Procedure: REVISION OF LEFT FEMORAL-POPLITEAL ARTERY BYPASS GRAFT;  Surgeon: Serafina Mitchell, MD;  Location: [place];  Service: Vascular;  Laterality: Left;  . THROMBECTOMY FEMORAL ARTERY Left 08/30/2016   Procedure: THROMBECTOMY OF FEMORAL-FEMORAL ARTERY BYPASS GRAFT AND LEFT FEMORAL TO LEFT POPLITEAL BYPASS  GRAFT;  Surgeon: Serafina Mitchell, MD;  Location: [place];  Service: Vascular;  Laterality: Left;  . TUBAL LIGATION Bilateral     Current Outpatient Rx  . Order #: [id number] Class: Print  . Order #: [id number] Class: Historical Med  . Order #: [id number] Class: Historical Med  . Order #: [id number] Class: Historical Med  . Order #: [id number] Class: Historical Med  . Order #: [id number] Class: Historical Med  . Order #: [id number] Class: Historical Med  . Order #: [id number] Class: No Print  . Order #: [id number] Class: Print  . Order #: [id number] Class: Print  . Order #: [id number] Class: Historical Med  . Order #: [id number] Class: Print  . Order #: [id number] Class: Historical Med  . Order #: [id number] Class: Historical Med  . Order #: [id number] Class: Historical Med  . Order #: [id number] Class: No Print  . Order #: [id number] Class: No Print  . Order #: [id number] Class: Historical Med  . Order #: [id number] Class: Historical Med    Allergies Penicillins  Family History  Problem Relation Age of Onset  . Cancer Mother        Cervical  . Stroke Mother   . Diabetes Mother   . Stroke Father   . Heart disease Father     Social History Social History  Substance Use Topics  .  Smoking status: Former Smoker    Types: Cigarettes    Quit date: 01/29/2001  . Smokeless tobacco: Never Used  . Alcohol use No    Review of Systems  Level 5 caveat: Dementia.  ____________________________________________   PHYSICAL EXAM:  VITAL SIGNS: ED Triage Vitals  Enc Vitals Group     BP 09/07/16 1057 (!) 130/97     Pulse Rate 09/07/16 1057 87     Resp 09/07/16 1057 (!) 27     Temp 09/07/16 1057 99.1 F (37.3 C)     Temp Source 09/07/16 1057 Oral     SpO2 09/07/16 1057 100 %     Pain Score 09/07/16 1048 0   Constitutional: Alert with baseline confusion.  Eyes: Conjunctivae are normal.  Head: Atraumatic. Nose: No congestion/rhinnorhea. Mouth/Throat: Mucous membranes are slightly dry.  Neck: No stridor.     Cardiovascular: A-fib, rate controlled. Good peripheral circulation. Grossly normal heart sounds.   Respiratory: Normal respiratory effort.  No retractions. Lungs CTAB. Gastrointestinal: Soft and nontender. No distention.  Musculoskeletal: No lower extremity tenderness nor edema. No gross deformities of extremities. Neurologic:  Normal speech and language. No gross focal neurologic deficits are appreciated.  Skin:  Two inguinal incisions, L>R. Well appearing on both sides. No palpable firm mass or hematoma on my exam. Single area of ooze between two staples on the left that is easily controlled with pressure. No erythema, warmth, or abscess.    ____________________________________________   LABS (all labs ordered are listed, but only abnormal results are displayed)  Labs Reviewed  BASIC METABOLIC PANEL - Abnormal; Notable for the following:       Result Value   Glucose, Bld 279 (*)    Creatinine, Ser 1.28 (*)    GFR calc non Af Amer 40 (*)    GFR calc Af Amer 46 (*)    All other components within normal limits  CBC WITH DIFFERENTIAL/PLATELET - Abnormal; Notable for the following:    RBC 3.50 (*)    Hemoglobin 9.7 (*)    HCT 29.8 (*)    RDW 16.5 (*)    Neutro Abs 7.8 (*)    All other components within normal limits  TROPONIN I - Abnormal; Notable for the following:    Troponin I 0.03 (*)    All other components within normal limits  CULTURE, BLOOD (ROUTINE X 2)  CULTURE, BLOOD (ROUTINE X 2)  PROTIME-INR  TROPONIN I  TROPONIN I  TROPONIN I   ____________________________________________  EKG   EKG Interpretation  Date/Time:  Friday September 07 2016 10:56:30 EDT Ventricular Rate:  96 PR Interval:    QRS Duration: 116 QT Interval:  401 QTC Calculation: 507 R Axis:   -66 Text Interpretation:  Atrial fibrillation Left anterior fascicular block LVH with secondary repolarization abnormality Anterior infarct, old No STEMI. Similar to prior tracing.  Confirmed by Nanda Quinton  [phone]) on 09/07/2016 11:18:37 AM       ____________________________________________  RADIOLOGY  Dg Chest 2 View  Result Date: 09/07/2016 CLINICAL DATA:  Tachycardia today. EXAM: CHEST  2 VIEW COMPARISON:  CT chest 01/10/2015.  PA and lateral chest 12/06/2011. FINDINGS: There is cardiomegaly without edema. Fullness of the right hilum consistent with lymphadenopathy as seen on the prior CT scan is unchanged. The lungs are clear. There is no pneumothorax. Small bilateral pleural effusions are seen. Aortic atherosclerosis is noted. IMPRESSION: Small bilateral pleural effusions. Cardiomegaly without edema. Atherosclerosis. Fullness of the right hilum consistent with lymphadenopathy as seen on  prior CT scan is unchanged. Electronically Signed   By: Inge Rise M.D.   On: 09/07/2016 12:17   Ct Angio Ao+bifem W & Or Wo Contrast  Result Date: 09/07/2016 CLINICAL DATA:  Status post repair of left femoral pseudoaneurysm with revision of right the left femoral- femoral bypass graft and left femoral-popliteal bypass graft. There has been some oozing of blood from the left groin operative site. EXAM: CT ANGIOGRAPHY OF ABDOMINAL AORTA WITH ILIOFEMORAL RUNOFF TECHNIQUE: Multidetector CT imaging of the abdomen, pelvis and lower extremities was performed using the standard protocol during bolus administration of intravenous contrast. Multiplanar CT image reconstructions and MIPs were obtained to evaluate the vascular anatomy. CONTRAST:  100 mL Isovue 370 IV COMPARISON:  CT of the abdomen and pelvis on 08/29/2016 FINDINGS: VASCULAR Aorta: The abdominal aorta and shows stable patency and atherosclerotic plaque. No evidence of aneurysmal disease or significant aortic stenosis. Celiac: Calcified plaque causes approximately 40- 50% origin stenosis. SMA: Calcified plaque causes approximately 50% origin stenosis. Renals: Bilateral single renal arteries without significant stenosis. IMA: Origin patent. The proximal trunk  shows heavily calcified plaque without occlusion. RIGHT Lower Extremity Inflow: Calcified common iliac artery without significant stenosis. Internal and external iliac arteries show diffuse disease. Maximal narrowing of the proximal external iliac artery approaches 50%. Outflow: Calcified plaque causes focal narrowing of the common femoral artery just prior to the femoral-femoral bypass graft. The artery is narrowed approximately 60- 70%. The femoral-femoral graft is patent but is poorly opacified distally in the left groin. There is some thickening around the distal graft extending superiorly and abutting the abdominal wall. There likely is some postoperative hemorrhage in this region as well as a few foci of air. No findings to suggest focal abscess. 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 Profunda femoral artery is patent. At the level of the popliteal artery, there is poor arterial opacification and popliteal patency cannot be adequately assessed. Runoff: Patency of runoff below the right knee cannot be adequately assessed, due to poor opacification of distal vessels. 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 Internal and external iliac arteries are also chronically occluded. At the level of the left groin, the distal aspect of the femoral- femoral bypass graft is poorly opacified and there is evidence now of a new synthetic femoral-popliteal bypass graft which is poorly opacified. Outflow: Unable to assess distal bypass graft and native popliteal artery patency. Runoff: Unable to assess runoff artery patency. Review of the MIP images confirms the above findings. NON-VASCULAR Lower chest: Bibasilar scarring and small bilateral pleural effusions, right greater than left. Hepatobiliary: No focal liver abnormality is seen. No gallstones, gallbladder wall thickening, or biliary dilatation. Pancreas: Unremarkable. No  pancreatic ductal dilatation or surrounding inflammatory changes. Spleen: Normal in size without focal abnormality. Adrenals/Urinary Tract: Adrenal glands are unremarkable. Stable nonobstructing right renal calculi. Stable lower pole renal cyst on the left. Stable small right-sided bladder calculus. Stomach/Bowel: No evidence of bowel obstruction or ileus. There is a fairly large amount of fecal material located in the rectum. No free air. Lymphatic: No enlarged lymph nodes identified. Reproductive: Uterus demonstrates calcified degenerated fibroids. No adnexal masses. Other: No free fluid or hernias. Musculoskeletal: Degenerative disc disease at L5-S1. IMPRESSION: VASCULAR 1. Poorly opacified distal segment of the right to left femoral- femoral bypass graft and poorly opacified new left femoral to popliteal bypass graft. Although some of this is felt to relate to contrast timing, there would  be some concern of the potential for imminent thrombosis and vascular surgical assessment is recommended. The previously identified left groin pseudoaneurysm has been resected and repaired and there is no evidence of recurrent pseudoaneurysm or active contrast extravasation. Some postoperative changes are evident around the graft likely representing some hemorrhage that extends superiorly abutting the abdominal wall. 2. The native right common femoral artery does demonstrate significant disease and focal moderate stenosis just proximal to the bypass graft anastomosis which could account for some potential inflow restriction into the femoral-femoral graft. 3. Poor opacification at the level of bilateral popliteal and tibial arteries on the CTA results and lack of accurate patency assessment of these vessels. NON-VASCULAR 1. Stable nonobstructing right renal calculi and bladder calculus. 2. Fairly large amount of stool is present in the rectum. Electronically Signed   By: Aletta Edouard M.D.   On: 09/07/2016 16:06     ____________________________________________   PROCEDURES  Procedure(s) performed:   Procedures  None  ____________________________________________   INITIAL IMPRESSION / ASSESSMENT AND PLAN / ED COURSE  Pertinent labs & imaging results that were available during my care of the patient were reviewed by me and considered in my medical decision making (see chart for details).  Patient presents to the emergency department with concern for tachycardia and bleeding from her postoperative site. She had a fem-fem bypass revision on 08/30/2016 with Dr. Trula Slade. Both incisions are very well appearing. No evidence of underlying abscess or significant hematoma. There is a small area of oozing between 2 staples on the left but is easily controlled with direct pressure. No appreciable seroma. Wound cultures from recent admission showed no growth on my reevaluation. Patient is currently rate controlled with A. Fib in the 80s to 90s. Afebrile here. She is on her baseline 2 L oxygen by nasal cannula. No acute distress. Plan for repeat labs with history of recent anemia. Plan to discuss the wound with vascular surgery on call for dressing recommendations and follow-up plan.   01:59 PM Spoke with Dr. Donzetta Matters with Vascular Surgery. Recommends CT angio abdomen/pelvis with bilateral runoff. Spoke with Radiology to ensure proper CT ordered.   03:20 PM Spoke with Dr. Donzetta Matters who has seen the patient. Recommends hospitalist admission for abx and monitoring. He replaced the dressing on the wound and reviewed the CT scan. Will page the hospitalist.   Discussed patient's case with Hospitalist, Dr. Jeneen Rinks. Patient and family (if present) updated with plan. Care transferred to Hospitalist service.  I reviewed all nursing notes, vitals, pertinent old records, EKGs, labs, imaging (as available).  ____________________________________________  FINAL CLINICAL IMPRESSION(S) / ED DIAGNOSES  Final diagnoses:  Weakness   Atrial fibrillation with RVR (HCC)  Wound discharge     MEDICATIONS GIVEN DURING THIS VISIT:  Medications  vancomycin (VANCOCIN) IVPB 1000 mg/200 mL premix (not administered)  ceFEPIme (MAXIPIME) 1 g in dextrose 5 % 50 mL IVPB (not administered)  vancomycin (VANCOCIN) 500 mg in sodium chloride 0.9 % 100 mL IVPB (not administered)  iopamidol (ISOVUE-370) 76 % injection (100 mLs Intravenous Contrast Given 09/07/16 1504)     NEW OUTPATIENT MEDICATIONS STARTED DURING THIS VISIT:  None   Note:  This document was prepared using Dragon voice recognition software and may include unintentional dictation errors.  Nanda Quinton, MD Emergency Medicine    Walta [place], [facility], MD 09/07/16 [phone]

## 2016-09-07 NOTE — ED Notes (Signed)
Applied quick clot to suture site that continued to have bloody drainage. Placed new gauze dressing over site.

## 2016-09-07 NOTE — Care Management Obs Status (Signed)
[place] NOTIFICATION   Patient Details  Name: Casey [name] MRN: [id number] Date of Birth: [birth year]   Medicare Observation Status Notification Given:  Ernesta Amble, RN 09/07/2016, 6:10 PM

## 2016-09-07 NOTE — ED Notes (Signed)
Attempted report x 2 

## 2016-09-07 NOTE — Progress Notes (Signed)
Pharmacy Antibiotic Note  Casey [name] is a 76 y.o. female admitted on 09/07/2016 with wound infection.  Pharmacy has been consulted for cefepime and vancomycin dosing.  Plan: Vancomycin 500 mg IV every 24 hours.  Goal trough 15-20 mcg/mL. Cefepime 1 g every 24 hours   Check vancomycin level prior to 4th dose. Monitor renal function, culture results, and clinical course.     Temp (24hrs), Avg:98.4 F (36.9 C), Min:97.7 F (36.5 C), Max:99.1 F (37.3 C)   Recent Labs Lab 09/01/16 0542 09/02/16 0222 09/04/16 09/07/16 1136  WBC 12.0*  --  10.2 9.4  CREATININE 1.44* 1.28* 1.1 1.28*    Estimated Creatinine Clearance: 29.5 mL/min (A) (by C-G formula based on SCr of 1.28 mg/dL (H)).    Allergies  Allergen Reactions  . Penicillins Other (See Comments)    Patient states that she was told previously by a doctor to not take this medication but is unsure why.     Antimicrobials this admission: Vancomycin 8/10 >>  Cefepime 8/10 >>   Dose adjustments this admission: N/A  Microbiology results: 8/10 BCx: pending  Thank you for allowing pharmacy to be a part of this patient's care.  Betsey Holiday 09/07/2016 3:31 PM

## 2016-09-07 NOTE — ED Notes (Signed)
Patient transported to CT 

## 2016-09-07 NOTE — H&P (Addendum)
TRH H&P   Patient Demographics:    Casey [name], is a 76 y.o. female  MRN: [id number]   DOB - Jun 10, 1940  Admit Date - 09/07/2016  Outpatient Primary MD for the patient is Gildardo Cranker, DO  Referring MD/NP/PA:   Dr. Laverta Baltimore  Outpatient Specialists: Servando Snare  Patient coming from: [facility]  Chief Complaint  Patient presents with  . Atrial Fibrillation      HPI:    Casey [name]  is a 76 y.o. female, w Dementia, (poor historian), CVA , seizure do,Dm2, CAD, CHF (EF 35-40% ), Mild MR, recent repair of L femoral pseudoaneurysm, presents with drainage from L groin incision,  Serosanqinous drainage.  Pt on levaquin at SNF>   ED consulted vascular  Who requested medicine admission and abx.  Pt hr 120 in ED intially but improved at this point in time.  Pt will be admitted for wound drainage and afib w rvr.      Review of systems:    In addition to the HPI above,  No Fever-chills, No Headache, No changes with Vision or hearing, No problems swallowing food or Liquids, No Chest pain, Cough or Shortness of Breath, No Abdominal pain, No Nausea or Vommitting, Bowel movements are regular, No Blood in stool or Urine, No dysuria, No new skin rashes or bruises, No new joints pains-aches,  No new weakness, tingling, numbness in any extremity, No recent weight gain or loss, No polyuria, polydypsia or polyphagia, No significant Mental Stressors.  A full 10 point Review of Systems was done, except as stated above, all other Review of Systems were negative.   With Past History of the following :    Past Medical History:  Diagnosis Date  . Abdominal or pelvic swelling, mass, or lump, left upper quadrant   . CHF (congestive heart failure) ([place])   . Chronic atrial fibrillation ([place])   . Chronic kidney disease   . Congestive heart failure, unspecified   .  Coronary atherosclerosis of unspecified type of vessel, native or graft   . Diabetes mellitus   . Edema   . GERD (gastroesophageal reflux disease)   . Gout, unspecified   . Hypercholesterolemia   . Hypertension   . 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  . Peripheral vascular disease, unspecified ([place])   . Seizures ([place])   . Sinus of Valsalva aneurysm    a. By 2D echo 05/2011.  . Stroke ([place])   . Unspecified hereditary and idiopathic peripheral neuropathy   . Unspecified urinary incontinence   . Vascular dementia, uncomplicated       Past Surgical History:  Procedure Laterality Date  . AORTOGRAM  09/01/2008   Abd w/ bilateral lower extremity runoff arteriography  . APPLICATION  OF WOUND VAC Left 08/30/2016   Procedure: APPLICATION OF WOUND VAC LEFT GROIN;  Surgeon: Serafina Mitchell, MD;  Location: [place];  Service: Vascular;  Laterality: Left;  . ENDARTERECTOMY FEMORAL Left 08/30/2016   Procedure: LEFT COMMON FEMORAL ARTERY ENDARTERECTOMY;  Surgeon: Serafina Mitchell, MD;  Location: [place];  Service: Vascular;  Laterality: Left;  . FALSE ANEURYSM REPAIR  12/06/2011   Procedure: REPAIR FALSE ANEURYSM;  Surgeon: Angelia Mould, MD;  Location: [facility] OR;  Service: Vascular;  Laterality: Left;  Repair of left femoral Artery pseudoaneurysm  . FALSE ANEURYSM REPAIR Left 08/30/2016   Procedure: REPAIR PSEUDOANEURYSM LEFT GROIN;  Surgeon: Serafina Mitchell, MD;  Location: [place];  Service: Vascular;  Laterality: Left;  . FEMORAL ARTERY EXPLORATION  12/06/2011   Procedure: FEMORAL ARTERY EXPLORATION;  Surgeon: Angelia Mould, MD;  Location: [facility] OR;  Service: Vascular;  Laterality: N/A;  Exploration of large pseudoaneurysm left side of fem-fem bypass graft   . femoral to femoral bypass graft     . FEMORAL-FEMORAL BYPASS GRAFT  12/06/2011   Procedure:  BYPASS GRAFT FEMORAL-FEMORAL ARTERY;  Surgeon: Angelia Mould, MD;  Location: [facility] OR;  Service: Vascular;  Laterality: Bilateral;  Revision of left to right Femoral-Femoral bypass graft  . FEMORAL-FEMORAL BYPASS GRAFT Left 08/30/2016   Procedure: REDO RIGHT TO LEFT FEMORAL-FEMORAL ARTERY BYPASS GRAFT;  Surgeon: Serafina Mitchell, MD;  Location: [place];  Service: Vascular;  Laterality: Left;  . FEMORAL-POPLITEAL BYPASS GRAFT Left 05/2002   lower extremity femoral to below knee w/ non-versed greater saphenous vein  . FEMORAL-POPLITEAL BYPASS GRAFT Left 11/2002  . FEMORAL-POPLITEAL BYPASS GRAFT Left 08/30/2016   Procedure: REVISION OF LEFT FEMORAL-POPLITEAL ARTERY BYPASS GRAFT;  Surgeon: Serafina Mitchell, MD;  Location: [place];  Service: Vascular;  Laterality: Left;  . THROMBECTOMY FEMORAL ARTERY Left 08/30/2016   Procedure: THROMBECTOMY OF FEMORAL-FEMORAL ARTERY BYPASS GRAFT AND LEFT FEMORAL TO LEFT POPLITEAL BYPASS GRAFT;  Surgeon: Serafina Mitchell, MD;  Location: [place];  Service: Vascular;  Laterality: Left;  . TUBAL LIGATION Bilateral       Social History:     Social History  Substance Use Topics  . Smoking status: Former Smoker    Types: Cigarettes    Quit date: 01/29/2001  . Smokeless tobacco: Never Used  . Alcohol use No     Lives - at [facility] - not ambulatory presently   Family History :     Family History  Problem Relation Age of Onset  . Cancer Mother        Cervical  . Stroke Mother   . Diabetes Mother   . Stroke Father   . Heart disease Father       Home Medications:   Prior to Admission medications   Medication Sig Start Date End Date Taking? Authorizing Provider  acetaminophen (TYLENOL) 325 MG tablet Take 2 tablets (650 mg total) by mouth every 6 (six) hours as needed for mild pain (or Fever >/= 101). 09/02/16   Osei-Bonsu, Iona Beard, MD  allopurinol (ZYLOPRIM) 100 MG tablet Take 100 mg by mouth daily.    [provider]  Amino  Acids-Protein Hydrolys (FEEDING SUPPLEMENT, PRO-STAT SUGAR FREE 64,) LIQD Take 30 mLs by mouth 2 (two) times daily.     [provider]  amLODipine (NORVASC) 2.5 MG tablet Take 2.5 mg by mouth daily.    [provider]  aspirin 81 MG tablet Take 81 mg by mouth daily.  [provider]  atorvastatin (LIPITOR) 10 MG tablet Take 10 mg by mouth at bedtime.     [provider]  CETYLPYRIDINIUM CHLORIDE MT Give 15 ml by mouth two times a day for oral irritation    [provider]  donepezil (ARICEPT) 10 MG tablet Take 1 tablet (10 mg total) by mouth at bedtime. 02/07/13   Isaac Bliss, Rayford Halsted, MD  guaiFENesin-dextromethorphan (ROBITUSSIN DM) 100-10 MG/5ML syrup Take 15 mLs by mouth every 4 (four) hours as needed for cough. 09/02/16   Benito Mccreedy, MD  hydrALAZINE (APRESOLINE) 25 MG tablet Take 1 tablet (25 mg total) by mouth every 6 (six) hours as needed (SBP > 170). 09/02/16   Osei-Bonsu, Iona Beard, MD  levETIRAcetam (KEPPRA) 250 MG tablet Take 250 mg by mouth 2 (two) times daily.    [provider]  levofloxacin (LEVAQUIN) 250 MG tablet Take 1 tablet (250 mg total) by mouth daily. 09/02/16 09/12/16  Benito Mccreedy, MD  metoprolol tartrate (LOPRESSOR) 25 MG tablet Take 50 mg by mouth every morning. And 25mg  at night. Hold if pulse  < 60    [provider]  mirtazapine (REMERON) 7.5 MG tablet Take 7.5 mg by mouth at bedtime.    [provider]  Nutritional Supplements (NUTRITIONAL SUPPLEMENT PO) House Supplement - Give 120 ml by mouth three times daily    [provider]  oxyCODONE (OXY IR/ROXICODONE) 5 MG immediate release tablet Take 1 tablet (5 mg total) by mouth every 6 (six) hours as needed for severe pain. 09/06/16   Gerlene Fee, NP  oxyCODONE (ROXICODONE) 5 MG immediate release tablet Take one tablet by mouth daily for 5 days 09/06/16   Gildardo Cranker, DO  pantoprazole (PROTONIX) 40 MG tablet Take 40 mg by mouth  daily.    [provider]  sertraline (ZOLOFT) 50 MG tablet Take 50 mg by mouth daily.     [provider]     Allergies:     Allergies  Allergen Reactions  . Penicillins Other (See Comments)    Patient states that she was told previously by a doctor to not take this medication but is unsure why.      Physical Exam:   Vitals  Blood pressure (!) 157/96, pulse 93, temperature 99.1 F (37.3 C), temperature source Oral, resp. rate (!) 27, SpO2 100 %.   1. General  lying in bed in NAD,   2. Normal affect and insight, Not Suicidal or Homicidal, Awake Alert, Oriented X 1  3. No F.N deficits, ALL C.Nerves Intact, Strength 5/5 all 4 extremities, Sensation intact all 4 extremities, Plantars down going.  4. Ears and Eyes appear Normal, Conjunctivae clear, PERRLA. Moist Oral Mucosa.  5. Supple Neck, No JVD, No cervical lymphadenopathy appriciated, No Carotid Bruits.  6. Symmetrical Chest wall movement, Good air movement bilaterally, CTAB.  7. Irr, irr,  S1, s2, 1/6 sem apex  8. Positive Bowel Sounds, Abdomen Soft, No tenderness, No organomegaly appriciated,No rebound -guarding or rigidity.  9.  No Cyanosis, incision intact, staples intact, no redness,  There is slight serosanquinous drainage. In the midline there is glue , and intact incision.   Toes normal color,  onychomycosis  10. Good muscle tone,  joints appear normal , no effusions, Normal ROM.  11. No Palpable Lymph Nodes in Neck or Axillae     Data Review:    CBC  Recent Labs Lab 08/31/16 1840 09/01/16 0542 09/04/16 09/07/16 1136  WBC  --  12.0*  10.2 9.4  HGB 9.6* 8.7* 10.5* 9.7*  HCT 29.4* 25.9* 32* 29.8*  PLT  --  115* 220 293  MCV  --  83.5  --  85.1  MCH  --  28.1  --  27.7  MCHC  --  33.6  --  32.6  RDW  --  16.3*  --  16.5*  LYMPHSABS  --   --   --  1.0  MONOABS  --   --   --  0.5  EOSABS  --   --   --  0.1  BASOSABS  --   --   --  0.0    ------------------------------------------------------------------------------------------------------------------  Chemistries   Recent Labs Lab 09/01/16 0542 09/02/16 0222 09/04/16 09/07/16 1136  NA 137 135 139 141  K 4.0 3.8 3.7 3.8  CL 110 106  --  106  CO2 22 23  --  27  GLUCOSE 134* 151*  --  279*  BUN 30* 27* 14 13  CREATININE 1.44* 1.28* 1.1 1.28*  CALCIUM 8.8* 9.0  --  9.2  AST  --   --  9*  --   ALT  --   --  6*  --   ALKPHOS  --   --  76  --    ------------------------------------------------------------------------------------------------------------------ estimated creatinine clearance is 29.5 mL/min (A) (by C-G formula based on SCr of 1.28 mg/dL (H)). ------------------------------------------------------------------------------------------------------------------ No results for input(s): TSH, T4TOTAL, T3FREE, THYROIDAB in the last 72 hours.  Invalid input(s): FREET3  Coagulation profile  Recent Labs Lab 09/07/16 1136  INR 1.17   ------------------------------------------------------------------------------------------------------------------- No results for input(s): DDIMER in the last 72 hours. -------------------------------------------------------------------------------------------------------------------  Cardiac Enzymes  Recent Labs Lab 09/07/16 1136  TROPONINI 0.03*   ------------------------------------------------------------------------------------------------------------------ No results found for: BNP   ---------------------------------------------------------------------------------------------------------------  Urinalysis    Component Value Date/Time   COLORURINE YELLOW 08/29/2016 1922   APPEARANCEUR CLEAR 08/29/2016 1922   LABSPEC 1.012 08/29/2016 1922   PHURINE 7.0 08/29/2016 1922   GLUCOSEU NEGATIVE 08/29/2016 1922   HGBUR SMALL (A) 08/29/2016 [place] NEGATIVE 08/29/2016 [place] NEGATIVE 08/29/2016  1922   PROTEINUR 100 (A) 08/29/2016 1922   UROBILINOGEN 0.2 02/02/2013 2008   NITRITE NEGATIVE 08/29/2016 1922   LEUKOCYTESUR TRACE (A) 08/29/2016 1922    ----------------------------------------------------------------------------------------------------------------   Imaging Results:    Dg Chest 2 View  Result Date: 09/07/2016 CLINICAL DATA:  Tachycardia today. EXAM: CHEST  2 VIEW COMPARISON:  CT chest 01/10/2015.  PA and lateral chest 12/06/2011. FINDINGS: There is cardiomegaly without edema. Fullness of the right hilum consistent with lymphadenopathy as seen on the prior CT scan is unchanged. The lungs are clear. There is no pneumothorax. Small bilateral pleural effusions are seen. Aortic atherosclerosis is noted. IMPRESSION: Small bilateral pleural effusions. Cardiomegaly without edema. Atherosclerosis. Fullness of the right hilum consistent with lymphadenopathy as seen on prior CT scan is unchanged. Electronically Signed   By: Inge Rise M.D.   On: 09/07/2016 12:17   Ct Angio Ao+bifem W & Or Wo Contrast  Result Date: 09/07/2016 CLINICAL DATA:  Status post repair of left femoral pseudoaneurysm with revision of right the left femoral- femoral bypass graft and left femoral-popliteal bypass graft. There has been some oozing of blood from the left groin operative site. EXAM: CT ANGIOGRAPHY OF ABDOMINAL AORTA WITH ILIOFEMORAL RUNOFF TECHNIQUE: Multidetector CT imaging of the abdomen, pelvis and lower extremities was performed using the standard protocol during bolus administration of intravenous contrast. Multiplanar CT image reconstructions and  MIPs were obtained to evaluate the vascular anatomy. CONTRAST:  100 mL Isovue 370 IV COMPARISON:  CT of the abdomen and pelvis on 08/29/2016 FINDINGS: VASCULAR Aorta: The abdominal aorta and shows stable patency and atherosclerotic plaque. No evidence of aneurysmal disease or significant aortic stenosis. Celiac: Calcified plaque causes approximately  40- 50% origin stenosis. SMA: Calcified plaque causes approximately 50% origin stenosis. Renals: Bilateral single renal arteries without significant stenosis. IMA: Origin patent. The proximal trunk shows heavily calcified plaque without occlusion. RIGHT Lower Extremity Inflow: Calcified common iliac artery without significant stenosis. Internal and external iliac arteries show diffuse disease. Maximal narrowing of the proximal external iliac artery approaches 50%. Outflow: Calcified plaque causes focal narrowing of the common femoral artery just prior to the femoral-femoral bypass graft. The artery is narrowed approximately 60- 70%. The femoral-femoral graft is patent but is poorly opacified distally in the left groin. There is some thickening around the distal graft extending superiorly and abutting the abdominal wall. There likely is some postoperative hemorrhage in this region as well as a few foci of air. No findings to suggest focal abscess. 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 Profunda femoral artery is patent. At the level of the popliteal artery, there is poor arterial opacification and popliteal patency cannot be adequately assessed. Runoff: Patency of runoff below the right knee cannot be adequately assessed, due to poor opacification of distal vessels. 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 Internal and external iliac arteries are also chronically occluded. At the level of the left groin, the distal aspect of the femoral- femoral bypass graft is poorly opacified and there is evidence now of a new synthetic femoral-popliteal bypass graft which is poorly opacified. Outflow: Unable to assess distal bypass graft and native popliteal artery patency. Runoff: Unable to assess runoff artery patency. Review of the MIP images confirms the above findings. NON-VASCULAR Lower chest: Bibasilar scarring and small bilateral  pleural effusions, right greater than left. Hepatobiliary: No focal liver abnormality is seen. No gallstones, gallbladder wall thickening, or biliary dilatation. Pancreas: Unremarkable. No pancreatic ductal dilatation or surrounding inflammatory changes. Spleen: Normal in size without focal abnormality. Adrenals/Urinary Tract: Adrenal glands are unremarkable. Stable nonobstructing right renal calculi. Stable lower pole renal cyst on the left. Stable small right-sided bladder calculus. Stomach/Bowel: No evidence of bowel obstruction or ileus. There is a fairly large amount of fecal material located in the rectum. No free air. Lymphatic: No enlarged lymph nodes identified. Reproductive: Uterus demonstrates calcified degenerated fibroids. No adnexal masses. Other: No free fluid or hernias. Musculoskeletal: Degenerative disc disease at L5-S1. IMPRESSION: VASCULAR 1. Poorly opacified distal segment of the right to left femoral- femoral bypass graft and poorly opacified new left femoral to popliteal bypass graft. Although some of this is felt to relate to contrast timing, there would be some concern of the potential for imminent thrombosis and vascular surgical assessment is recommended. The previously identified left groin pseudoaneurysm has been resected and repaired and there is no evidence of recurrent pseudoaneurysm or active contrast extravasation. Some postoperative changes are evident around the graft likely representing some hemorrhage that extends superiorly abutting the abdominal wall. 2. The native right common femoral artery does demonstrate significant disease and focal moderate stenosis just proximal to the bypass graft anastomosis which could account for some potential inflow restriction into the femoral-femoral graft. 3. Poor opacification at the level of bilateral popliteal and tibial arteries on the CTA results and  lack of accurate patency assessment of these vessels. NON-VASCULAR 1. Stable  nonobstructing right renal calculi and bladder calculus. 2. Fairly large amount of stool is present in the rectum. Electronically Signed   By: Aletta Edouard M.D.   On: 09/07/2016 16:06       Assessment & Plan:    Principal Problem:   Atrial fibrillation with rapid ventricular response (HCC) Active Problems:   PVD (peripheral vascular disease) ([place])   Type 2 diabetes mellitus with diabetic neuropathy, without long-term current use of insulin (HCC)   Wound discharge   Anemia     Pafib with RVR, resolved Not on coumadin or anticoagulation presumably due to fall risk w dementia ? Cont metoprolol Cont aspirin  L inguinal wound drainage Wound culture vanco iv, cefepime iv Vascular surgery to reevaluate in am  Hypertension Continue metoprolol, cont hydralazine  Seizure do Cont keppra  Gout Cont allopurinol   Insomnia Continue     DVT Prophylaxis Lovenox  AM Labs Ordered, also please review Full Orders  Family Communication: Admission, patients condition and plan of care including tests being ordered have been discussed with the patient  who indicate understanding and agree with the plan and Code Status.  Code Status FULL CODE  Likely DC to  SNF  Condition GUARDED   Consults called: vascular surgery by ED  Admission status: observation  Time spent in minutes : 45 minutes   Jani Gravel M.D on 09/07/2016 at 4:14 PM  Between 7am to 7pm - Pager - [phone]. After 7pm go to [email or web address] - password [facility]  [facility] - Office  [phone]

## 2016-09-07 NOTE — ED Triage Notes (Signed)
Pt from [place]. EMS called for Afib RVR rates 140-180. Unsure if hx of this. 275mL given by EMS and rate dropped 80-100 Afib. Recent surgery, EMS unable to report what kind of surgery. Pt at mental baseline- dementia. Wears 2L [place] at facility. 96% on 2L [facility], BP 139/70

## 2016-09-08 ENCOUNTER — Observation Stay (HOSPITAL_BASED_OUTPATIENT_CLINIC_OR_DEPARTMENT_OTHER): Payer: Medicare Other

## 2016-09-08 DIAGNOSIS — I429 Cardiomyopathy, unspecified: Secondary | ICD-10-CM | POA: Diagnosis present

## 2016-09-08 DIAGNOSIS — E1140 Type 2 diabetes mellitus with diabetic neuropathy, unspecified: Secondary | ICD-10-CM | POA: Diagnosis not present

## 2016-09-08 DIAGNOSIS — I4891 Unspecified atrial fibrillation: Secondary | ICD-10-CM | POA: Diagnosis not present

## 2016-09-08 DIAGNOSIS — Y838 Other surgical procedures as the cause of abnormal reaction of the patient, or of later complication, without mention of misadventure at the time of the procedure: Secondary | ICD-10-CM | POA: Diagnosis present

## 2016-09-08 DIAGNOSIS — T148XXA Other injury of unspecified body region, initial encounter: Secondary | ICD-10-CM | POA: Diagnosis not present

## 2016-09-08 DIAGNOSIS — M109 Gout, unspecified: Secondary | ICD-10-CM | POA: Diagnosis present

## 2016-09-08 DIAGNOSIS — I739 Peripheral vascular disease, unspecified: Secondary | ICD-10-CM

## 2016-09-08 DIAGNOSIS — K219 Gastro-esophageal reflux disease without esophagitis: Secondary | ICD-10-CM | POA: Diagnosis present

## 2016-09-08 DIAGNOSIS — N183 Chronic kidney disease, stage 3 (moderate): Secondary | ICD-10-CM | POA: Diagnosis present

## 2016-09-08 DIAGNOSIS — E440 Moderate protein-calorie malnutrition: Secondary | ICD-10-CM | POA: Diagnosis present

## 2016-09-08 DIAGNOSIS — I493 Ventricular premature depolarization: Secondary | ICD-10-CM | POA: Diagnosis present

## 2016-09-08 DIAGNOSIS — I130 Hypertensive heart and chronic kidney disease with heart failure and stage 1 through stage 4 chronic kidney disease, or unspecified chronic kidney disease: Secondary | ICD-10-CM | POA: Diagnosis present

## 2016-09-08 DIAGNOSIS — I2510 Atherosclerotic heart disease of native coronary artery without angina pectoris: Secondary | ICD-10-CM | POA: Diagnosis present

## 2016-09-08 DIAGNOSIS — N179 Acute kidney failure, unspecified: Secondary | ICD-10-CM | POA: Diagnosis present

## 2016-09-08 DIAGNOSIS — E1122 Type 2 diabetes mellitus with diabetic chronic kidney disease: Secondary | ICD-10-CM | POA: Diagnosis present

## 2016-09-08 DIAGNOSIS — Z823 Family history of stroke: Secondary | ICD-10-CM | POA: Diagnosis not present

## 2016-09-08 DIAGNOSIS — E7800 Pure hypercholesterolemia, unspecified: Secondary | ICD-10-CM | POA: Diagnosis present

## 2016-09-08 DIAGNOSIS — G4700 Insomnia, unspecified: Secondary | ICD-10-CM | POA: Diagnosis present

## 2016-09-08 DIAGNOSIS — E785 Hyperlipidemia, unspecified: Secondary | ICD-10-CM | POA: Diagnosis present

## 2016-09-08 DIAGNOSIS — I361 Nonrheumatic tricuspid (valve) insufficiency: Secondary | ICD-10-CM | POA: Diagnosis not present

## 2016-09-08 DIAGNOSIS — R531 Weakness: Secondary | ICD-10-CM | POA: Diagnosis present

## 2016-09-08 DIAGNOSIS — D649 Anemia, unspecified: Secondary | ICD-10-CM | POA: Diagnosis present

## 2016-09-08 DIAGNOSIS — R569 Unspecified convulsions: Secondary | ICD-10-CM | POA: Diagnosis present

## 2016-09-08 DIAGNOSIS — H548 Legal blindness, as defined in USA: Secondary | ICD-10-CM | POA: Diagnosis present

## 2016-09-08 DIAGNOSIS — I509 Heart failure, unspecified: Secondary | ICD-10-CM | POA: Diagnosis present

## 2016-09-08 DIAGNOSIS — I482 Chronic atrial fibrillation: Secondary | ICD-10-CM | POA: Diagnosis present

## 2016-09-08 DIAGNOSIS — Z8673 Personal history of transient ischemic attack (TIA), and cerebral infarction without residual deficits: Secondary | ICD-10-CM | POA: Diagnosis not present

## 2016-09-08 DIAGNOSIS — E1151 Type 2 diabetes mellitus with diabetic peripheral angiopathy without gangrene: Secondary | ICD-10-CM | POA: Diagnosis present

## 2016-09-08 DIAGNOSIS — F0150 Vascular dementia without behavioral disturbance: Secondary | ICD-10-CM | POA: Diagnosis present

## 2016-09-08 DIAGNOSIS — Z6824 Body mass index (BMI) 24.0-24.9, adult: Secondary | ICD-10-CM | POA: Diagnosis not present

## 2016-09-08 LAB — BLOOD CULTURE ID PANEL (REFLEXED)
Acinetobacter baumannii: NOT DETECTED
CANDIDA KRUSEI: NOT DETECTED
CANDIDA PARAPSILOSIS: NOT DETECTED
CANDIDA TROPICALIS: NOT DETECTED
CARBAPENEM RESISTANCE: NOT DETECTED
Candida albicans: NOT DETECTED
Candida glabrata: NOT DETECTED
ENTEROBACTERIACEAE SPECIES: NOT DETECTED
ENTEROCOCCUS SPECIES: NOT DETECTED
Enterobacter cloacae complex: NOT DETECTED
Escherichia coli: NOT DETECTED
Haemophilus influenzae: NOT DETECTED
KLEBSIELLA OXYTOCA: NOT DETECTED
KLEBSIELLA PNEUMONIAE: NOT DETECTED
LISTERIA MONOCYTOGENES: NOT DETECTED
Methicillin resistance: NOT DETECTED
Neisseria meningitidis: NOT DETECTED
PSEUDOMONAS AERUGINOSA: NOT DETECTED
Proteus species: NOT DETECTED
SERRATIA MARCESCENS: NOT DETECTED
STAPHYLOCOCCUS SPECIES: DETECTED — AB
Staphylococcus aureus (BCID): NOT DETECTED
Streptococcus agalactiae: NOT DETECTED
Streptococcus pneumoniae: NOT DETECTED
Streptococcus pyogenes: NOT DETECTED
Streptococcus species: NOT DETECTED
VANCOMYCIN RESISTANCE: NOT DETECTED

## 2016-09-08 LAB — GLUCOSE, CAPILLARY
GLUCOSE-CAPILLARY: 136 mg/dL — AB (ref 65–99)
GLUCOSE-CAPILLARY: 232 mg/dL — AB (ref 65–99)
GLUCOSE-CAPILLARY: 128 mg/dL — AB (ref 65–99)
Glucose-Capillary: 154 mg/dL — ABNORMAL HIGH (ref 65–99)

## 2016-09-08 LAB — COMPREHENSIVE METABOLIC PANEL
ALBUMIN: 3 g/dL — AB (ref 3.5–5.0)
ALK PHOS: 66 U/L (ref 38–126)
ALT: 9 U/L — ABNORMAL LOW (ref 14–54)
ANION GAP: 11 (ref 5–15)
AST: 16 U/L (ref 15–41)
BUN: 14 mg/dL (ref 6–20)
CO2: 24 mmol/L (ref 22–32)
Calcium: 9.4 mg/dL (ref 8.9–10.3)
Chloride: 107 mmol/L (ref 101–111)
Creatinine, Ser: 1.2 mg/dL — ABNORMAL HIGH (ref 0.44–1.00)
GFR calc non Af Amer: 43 mL/min — ABNORMAL LOW (ref >60)
GFR, EST AFRICAN AMERICAN: 50 mL/min — AB (ref >60)
GLUCOSE: 142 mg/dL — AB (ref 65–99)
POTASSIUM: 4.2 mmol/L (ref 3.5–5.1)
SODIUM: 142 mmol/L (ref 135–145)
TOTAL PROTEIN: 6.5 g/dL (ref 6.5–8.1)
Total Bilirubin: 0.9 mg/dL (ref 0.3–1.2)

## 2016-09-08 LAB — CBC
HCT: 31.2 % — ABNORMAL LOW (ref 36.0–46.0)
Hemoglobin: 10 g/dL — ABNORMAL LOW (ref 12.0–15.0)
MCH: 27.2 pg (ref 26.0–34.0)
MCHC: 32.1 g/dL (ref 30.0–36.0)
MCV: 84.8 fL (ref 78.0–100.0)
Platelets: 268 10*3/uL (ref 150–400)
RBC: 3.68 MIL/uL — ABNORMAL LOW (ref 3.87–5.11)
RDW: 16.3 % — AB (ref 11.5–15.5)
WBC: 10.5 10*3/uL (ref 4.0–10.5)

## 2016-09-08 LAB — ECHOCARDIOGRAM COMPLETE
Height: 60 in
Weight: 2036.8 [oz_av]

## 2016-09-08 LAB — TROPONIN I
Troponin I: 0.03 ng/mL
Troponin I: 0.03 ng/mL (ref <0.03)

## 2016-09-08 MED ORDER — ASPIRIN EC 81 MG PO TBEC
81.0000 mg | DELAYED_RELEASE_TABLET | Freq: Every day | ORAL | Status: DC
  Administered 2016-09-08 – 2016-09-09 (×2): 81 mg via ORAL
  Filled 2016-09-08 (×2): qty 1

## 2016-09-08 MED ORDER — AMLODIPINE BESYLATE 2.5 MG PO TABS
2.5000 mg | ORAL_TABLET | Freq: Every day | ORAL | Status: DC
  Administered 2016-09-08 – 2016-09-09 (×2): 2.5 mg via ORAL
  Filled 2016-09-08 (×2): qty 1

## 2016-09-08 MED ORDER — ALLOPURINOL 100 MG PO TABS
100.0000 mg | ORAL_TABLET | Freq: Every day | ORAL | Status: DC
  Administered 2016-09-08 – 2016-09-09 (×2): 100 mg via ORAL
  Filled 2016-09-08 (×2): qty 1

## 2016-09-08 MED ORDER — SERTRALINE HCL 50 MG PO TABS
50.0000 mg | ORAL_TABLET | Freq: Every day | ORAL | Status: DC
  Administered 2016-09-08: 50 mg via ORAL
  Filled 2016-09-08: qty 1

## 2016-09-08 MED ORDER — METOPROLOL TARTRATE 25 MG PO TABS: 25.0000 mg | ORAL_TABLET | Freq: Two times a day (BID) | ORAL | Status: DC

## 2016-09-08 MED ORDER — ATORVASTATIN CALCIUM 10 MG PO TABS
10.0000 mg | ORAL_TABLET | Freq: Every day | ORAL | Status: DC
  Administered 2016-09-08: 10 mg via ORAL
  Filled 2016-09-08: qty 1

## 2016-09-08 MED ORDER — LEVETIRACETAM 250 MG PO TABS
250.0000 mg | ORAL_TABLET | Freq: Two times a day (BID) | ORAL | Status: DC
  Administered 2016-09-08 – 2016-09-09 (×2): 250 mg via ORAL
  Filled 2016-09-08 (×2): qty 1

## 2016-09-08 MED ORDER — DONEPEZIL HCL 10 MG PO TABS
10.0000 mg | ORAL_TABLET | Freq: Every day | ORAL | Status: DC
  Administered 2016-09-08: 10 mg via ORAL
  Filled 2016-09-08: qty 1

## 2016-09-08 MED ORDER — METOPROLOL TARTRATE 50 MG PO TABS
50.0000 mg | ORAL_TABLET | Freq: Two times a day (BID) | ORAL | Status: DC
  Administered 2016-09-08 – 2016-09-09 (×3): 50 mg via ORAL
  Filled 2016-09-08 (×3): qty 1

## 2016-09-08 MED ORDER — HYDRALAZINE HCL 25 MG PO TABS: 25.0000 mg | ORAL_TABLET | Freq: Four times a day (QID) | ORAL | Status: DC | PRN

## 2016-09-08 MED ORDER — PANTOPRAZOLE SODIUM 40 MG PO TBEC
40.0000 mg | DELAYED_RELEASE_TABLET | Freq: Every day | ORAL | Status: DC
  Administered 2016-09-08 – 2016-09-09 (×2): 40 mg via ORAL
  Filled 2016-09-08 (×2): qty 1

## 2016-09-08 MED ORDER — MIRTAZAPINE 7.5 MG PO TABS
7.5000 mg | ORAL_TABLET | Freq: Every day | ORAL | Status: DC
  Administered 2016-09-08: 7.5 mg via ORAL
  Filled 2016-09-08: qty 1

## 2016-09-08 NOTE — Progress Notes (Addendum)
Spoke with Dr. Donzetta Matters who stated he would come to apply the wound vac today or tomorrow. Wound vac supplies is at pt's bedside.

## 2016-09-08 NOTE — Progress Notes (Signed)
   prevena vac applied. Can remain in place for 7 days. When ok per primary can d/c and f/u with Dr. Trula Slade on 8.27.   Casey [name] C. Donzetta Matters, MD [facility] [place] Office: [phone] Pager: [phone]

## 2016-09-08 NOTE — Progress Notes (Signed)
  Progress Note    09/08/2016 11:51 AM * No surgery found *  Subjective:  No new complaints  Vitals:   09/08/16 0815 09/08/16 0945  BP: (!) 146/75 (!) 146/88  Pulse:    Resp:  (!) 23  Temp:    SpO2:      Physical Exam: Alert Left groin incision is cdi, no drainage Left foot is warm  CBC    Component Value Date/Time   WBC 10.5 09/08/2016 0242   RBC 3.68 (L) 09/08/2016 0242   HGB 10.0 (L) 09/08/2016 0242   HCT 31.2 (L) 09/08/2016 0242   PLT 268 09/08/2016 0242   MCV 84.8 09/08/2016 0242   MCH 27.2 09/08/2016 0242   MCHC 32.1 09/08/2016 0242   RDW 16.3 (H) 09/08/2016 0242   LYMPHSABS 1.0 09/07/2016 1136   MONOABS 0.5 09/07/2016 1136   EOSABS 0.1 09/07/2016 1136   BASOSABS 0.0 09/07/2016 1136    BMET    Component Value Date/Time   NA 142 09/08/2016 0242   NA 139 09/04/2016   K 4.2 09/08/2016 0242   CL 107 09/08/2016 0242   CO2 24 09/08/2016 0242   GLUCOSE 142 (H) 09/08/2016 0242   BUN 14 09/08/2016 0242   BUN 14 09/04/2016   CREATININE 1.20 (H) 09/08/2016 0242   CALCIUM 9.4 09/08/2016 0242   GFRNONAA 43 (L) 09/08/2016 0242   GFRAA 50 (L) 09/08/2016 0242    INR    Component Value Date/Time   INR 1.17 09/07/2016 1136     Intake/Output Summary (Last 24 hours) at 09/08/16 1151 Last data filed at 09/08/16 1100  Gross per 24 hour  Intake              413 ml  Output                0 ml  Net              413 ml      ASSESSMENT/PLAN: This is a 76 y.o. female s/p                          #1: Repair of left femoral pseudoaneurysm #2: Redo left femoral artery exposure #3: Revision of right to left femoral-femoral bypass graft with interposition 8 mm dacron #4: Revision of left femoral-popliteal vein bypass graft with interposition Gore-Tex graft #5: Ligation of left common femoral artery #6: Left femoral  endarterectomy #7: Thrombectomy of left femoral popliteal bypass graft #8: Thrombectomy of right to left femoral-femoral bypass graft #9: Application of wound VAC  Will have prevena vac ordered to be applied left groin incision. Hr improved today. Should be ok for dc from vascular standpoint when prevena applied.    Garv Kuechle C. Donzetta Matters, MD [facility] [place] Office: [phone] Pager: [phone]  09/08/2016 11:51 AM

## 2016-09-08 NOTE — Progress Notes (Signed)
PROGRESS NOTE                                                                                                                                                                                                             Patient Demographics:    Casey [name], is a 76 y.o. female, DOB - 1940-10-29, TML:[id number]  Admit date - 09/07/2016   Admitting Physician Jani Gravel, MD  Outpatient Primary MD for the patient is Gildardo Cranker, DO  LOS - 0  Outpatient Specialists:Dr. Donzetta Matters  Chief Complaint  Patient presents with  . Atrial Fibrillation       Brief Narrative   76 year old female with severe dementia, history of CVA, A. fib (not on medication due to severe dementia and? Fall), history of seizures, cardiomyopathy with EF of 30-40%, recent repair of left femoral pseudoaneurysm sent from SNF with continued drainage from the wound. Placed on observation and vascular surgery consulted.   Subjective:   Patient extended confused. Heart rate elevated to 120s-130s on the monitor.   Assessment  & Plan :    Principal Problem:   Atrial fibrillation with rapid ventricular response (HCC) Improved since admission but having episodes of rapid heart rate in the range of 120s-130s. Patient is on metoprolol 50 mg in a.m. and 25 mg p.m. which was not ordered on admission. Resume metoprolol (50 mg twice a day). Not on anticoagulation possibly due to fall risks with severe dementia.  Active Problems: Left groin wound discharge Wound culture sent. On empiric IV vancomycin and cefepime. Blood culture was still growing staph species suspect is contaminant Appears clean on exam today and no discharge noted. Vascular surgery plan on applying prevena vac to groin wound. We reapplied either later today or tomorrow.    PVD (peripheral vascular disease) (Short) Recent repair of left femoral pseudoaneurysm, revision of bilateral femorofemoral bypass  graft. Continue aspirin and statin.    Type 2 diabetes mellitus with diabetic neuropathy, without long-term current use of insulin (HCC) Monitor on sliding scale coverage.  Essential hypertension Continue amlodipine, metoprolol and hydralazine.  History of seizures Continue Keppra    Severe dementia Continue Aricept.  Protein calorie malnutrition Continue supplements   Code Status : Full code  Family Communication  : None at bedside  Disposition Plan  : Discharge to SNF possibly tomorrow after one applied  Barriers For Discharge : Active symptoms  Consults  :  Vascular surgery  Procedures  : None  DVT Prophylaxis  :  Lovenox -   Lab Results  Component Value Date   PLT 268 09/08/2016    Antibiotics  :    Anti-infectives    Start     Dose/Rate Route Frequency Ordered Stop   09/08/16 1600  vancomycin (VANCOCIN) 500 mg in sodium chloride 0.9 % 100 mL IVPB     500 mg 100 mL/hr over 60 Minutes Intravenous Every 24 hours 09/07/16 1530     09/07/16 1530  aztreonam (AZACTAM) 2 g in dextrose 5 % 50 mL IVPB  Status:  Discontinued     2 g 100 mL/hr over 30 Minutes Intravenous  Once 09/07/16 1520 09/07/16 1528   09/07/16 1530  metroNIDAZOLE (FLAGYL) IVPB 500 mg  Status:  Discontinued     500 mg 100 mL/hr over 60 Minutes Intravenous  Once 09/07/16 1520 09/07/16 1529   09/07/16 1530  vancomycin (VANCOCIN) IVPB 1000 mg/200 mL premix     1,000 mg 200 mL/hr over 60 Minutes Intravenous  Once 09/07/16 1520 09/07/16 2000   09/07/16 1530  ceFEPIme (MAXIPIME) 1 g in dextrose 5 % 50 mL IVPB     1 g 100 mL/hr over 30 Minutes Intravenous Every 24 hours 09/07/16 1529          Objective:   Vitals:   09/08/16 0644 09/08/16 0645 09/08/16 0815 09/08/16 0945  BP:  (!) 149/93 (!) 146/75 (!) 146/88  Pulse:  96    Resp:  (!) 27  (!) 23  Temp:  98 F (36.7 C)    TempSrc:  Oral    SpO2:  96%    Weight: 57.7 kg (127 lb 4.8 oz)     Height:        Wt Readings from Last 3 Encounters:   09/08/16 57.7 kg (127 lb 4.8 oz)  09/07/16 56.7 kg (125 lb)  09/06/16 56.7 kg (125 lb)     Intake/Output Summary (Last 24 hours) at 09/08/16 1438 Last data filed at 09/08/16 1100  Gross per 24 hour  Intake              413 ml  Output                0 ml  Net              413 ml     Physical Exam  Gen: Elderly female not in distress, confused HEENT:  moist mucosa, supple neck Chest: clear b/l, no added sounds CVS:  S1&S2 irregularly irregular, no murmurs, rubs or gallop GI: soft, NT, ND,  Musculoskeletal: warm, clean staples over left groin wound, no discharge     Data Review:    CBC  Recent Labs Lab 09/04/16 09/07/16 1136 09/08/16 0242  WBC 10.2 9.4 10.5  HGB 10.5* 9.7* 10.0*  HCT 32* 29.8* 31.2*  PLT 220 293 268  MCV  --  85.1 84.8  MCH  --  27.7 27.2  MCHC  --  32.6 32.1  RDW  --  16.5* 16.3*  LYMPHSABS  --  1.0  --   MONOABS  --  0.5  --   EOSABS  --  0.1  --   BASOSABS  --  0.0  --     Chemistries   Recent Labs Lab 09/02/16 0222 09/04/16 09/07/16 1136 09/08/16 0242  NA 135 139 141 142  K 3.8  3.7 3.8 4.2  CL 106  --  106 107  CO2 23  --  27 24  GLUCOSE 151*  --  279* 142*  BUN 27* 14 13 14   CREATININE 1.28* 1.1 1.28* 1.20*  CALCIUM 9.0  --  9.2 9.4  AST  --  9*  --  16  ALT  --  6*  --  9*  ALKPHOS  --  76  --  66  BILITOT  --   --   --  0.9   ------------------------------------------------------------------------------------------------------------------ No results for input(s): CHOL, HDL, LDLCALC, TRIG, CHOLHDL, LDLDIRECT in the last 72 hours.  Lab Results  Component Value Date   HGBA1C 6.6 (H) 08/30/2016   ------------------------------------------------------------------------------------------------------------------ No results for input(s): TSH, T4TOTAL, T3FREE, THYROIDAB in the last 72 hours.  Invalid input(s):  FREET3 ------------------------------------------------------------------------------------------------------------------ No results for input(s): VITAMINB12, FOLATE, FERRITIN, TIBC, IRON, RETICCTPCT in the last 72 hours.  Coagulation profile  Recent Labs Lab 09/07/16 1136  INR 1.17    No results for input(s): DDIMER in the last 72 hours.  Cardiac Enzymes  Recent Labs Lab 09/07/16 2016 09/08/16 0242 09/08/16 0729  TROPONINI 0.03* 0.03* 0.03*   ------------------------------------------------------------------------------------------------------------------ No results found for: BNP  Inpatient Medications  Scheduled Meds: . enoxaparin (LOVENOX) injection  30 mg Subcutaneous Q24H  . insulin aspart  0-5 Units Subcutaneous QHS  . insulin aspart  0-9 Units Subcutaneous TID WC  . metoprolol tartrate  50 mg Oral BID  . sodium chloride flush  3 mL Intravenous Q12H   Continuous Infusions: . sodium chloride    . ceFEPime (MAXIPIME) IV Stopped (09/07/16 1750)  . vancomycin     PRN Meds:.sodium chloride, acetaminophen **OR** acetaminophen, sodium chloride flush  Micro Results Recent Results (from the past 240 hour(s))  Urine culture     Status: Abnormal   Collection Time: 08/29/16  7:22 PM  Result Value Ref Range Status   Specimen Description URINE, CLEAN CATCH  Final   Special Requests NONE  Final   Culture MULTIPLE SPECIES PRESENT, SUGGEST RECOLLECTION (A)  Final   Report Status 08/31/2016 FINAL  Final  MRSA PCR Screening     Status: None   Collection Time: 08/30/16  4:12 AM  Result Value Ref Range Status   MRSA by PCR NEGATIVE NEGATIVE Final    Comment:        The GeneXpert MRSA Assay (FDA approved for NASAL specimens only), is one component of a comprehensive MRSA colonization surveillance program. It is not intended to diagnose MRSA infection nor to guide or monitor treatment for MRSA infections.   Aerobic/Anaerobic Culture (surgical/deep wound)     Status:  None   Collection Time: 08/30/16  9:34 AM  Result Value Ref Range Status   Specimen Description TISSUE LEFT FEMORAL ARTERY  Final   Special Requests GRAFT TISSUE POF VANC  Final   Gram Stain   Final    RARE WBC PRESENT, PREDOMINANTLY PMN NO ORGANISMS SEEN    Culture No growth aerobically or anaerobically.  Final   Report Status 09/05/2016 FINAL  Final  Blood Culture (routine x 2)     Status: None (Preliminary result)   Collection Time: 09/07/16  4:15 PM  Result Value Ref Range Status   Specimen Description BLOOD LEFT ANTECUBITAL  Final   Special Requests   Final    BOTTLES DRAWN AEROBIC AND ANAEROBIC Blood Culture adequate volume   Culture  Setup Time   Final    GRAM POSITIVE COCCI IN CLUSTERS AEROBIC  BOTTLE ONLY Organism ID to follow    Culture GRAM POSITIVE COCCI  Final   Report Status PENDING  Incomplete  Blood Culture (routine x 2)     Status: None (Preliminary result)   Collection Time: 09/07/16  5:05 PM  Result Value Ref Range Status   Specimen Description BLOOD RIGHT HAND  Final   Special Requests IN PEDIATRIC BOTTLE Blood Culture adequate volume  Final   Culture NO GROWTH < 24 HOURS  Final   Report Status PENDING  Incomplete    Radiology Reports Dg Chest 2 View  Result Date: 09/07/2016 CLINICAL DATA:  Tachycardia today. EXAM: CHEST  2 VIEW COMPARISON:  CT chest 01/10/2015.  PA and lateral chest 12/06/2011. FINDINGS: There is cardiomegaly without edema. Fullness of the right hilum consistent with lymphadenopathy as seen on the prior CT scan is unchanged. The lungs are clear. There is no pneumothorax. Small bilateral pleural effusions are seen. Aortic atherosclerosis is noted. IMPRESSION: Small bilateral pleural effusions. Cardiomegaly without edema. Atherosclerosis. Fullness of the right hilum consistent with lymphadenopathy as seen on prior CT scan is unchanged. Electronically Signed   By: Inge Rise M.D.   On: 09/07/2016 12:17   Ct Abdomen Pelvis W  Contrast  Result Date: 08/29/2016 CLINICAL DATA:  Initial evaluation for acute abdominal pain, left rolling mass. EXAM: CT ABDOMEN AND PELVIS WITH CONTRAST TECHNIQUE: Multidetector CT imaging of the abdomen and pelvis was performed using the standard protocol following bolus administration of intravenous contrast. CONTRAST:  165mL ISOVUE-300 IOPAMIDOL (ISOVUE-300) INJECTION 61% COMPARISON:  Prior CT from 12/04/2011. FINDINGS: Lower chest: Scattered atelectatic changes noted within the visualized lung bases. Cardiomegaly partially visualized. No pleural or pericardial effusion. Hepatobiliary: Liver demonstrates a normal contrast enhanced appearance. Gallbladder within normal limits. No biliary dilatation. Pancreas: Pancreas somewhat atrophic but otherwise unremarkable without acute inflammation or mass lesion. Spleen: Subcentimeter hypodensity noted within central aspect of the spleen, of doubtful significance. Spleen otherwise unremarkable. Adrenals/Urinary Tract: Adrenal glands within normal limits. Kidneys relatively equal in size with symmetric enhancement. Scattered cortical thinning and scarring. 5.6 cm cyst noted extending from the lower pole left kidney. Additional scattered subcentimeter hypodensities within the bilateral kidneys too small the characterize, but statistically likely reflects small cysts as well. Multiple nonobstructive calculi present within the right kidney, largest of which positioned within the lower pole and measures approximately 15 mm. Mild right hydroureteronephrosis. There is an obstructive 6 mm calculus at the right UVJ (series 2, image 64). No left-sided renal calculi. No left-sided hydronephrosis or hydroureter. Bladder largely decompressed. Mild circumferential bladder wall thickening like related incomplete distension. Stomach/Bowel: Stomach within normal limits. No evidence for bowel obstruction. Appendix within normal limits. No acute inflammatory changes seen about the  bowels. Large volume stool seen packed within the rectal vault, suggesting constipation. Vascular/Lymphatic: Extensive atherosclerosis throughout the intra-abdominal aorta and its branch vessels. Origin of the celiac axis, SMA, renal arteries, and IMA are grossly patent. Occlusion of the left common iliac just distal to its origin (series 2, image 42). Left internal iliac occluded proximally, with distal reconstitution likely via collateralization. Occluded vascular stent in place within the left external iliac artery. Extensive atherosclerotic disease throughout the right iliac system. Fem-fem bypass graft in place. That right common femoral anastomosis appears patent. Large pseudoaneurysm measuring 4.4 x 5.7 x 7.5 cm arises from the left common femoral anastomosis in the left inguinal region (series 2, image 63). Opacification of the left common femoral artery distally. Reproductive: Scattered calcified uterine fibroids noted. Uterus and  ovaries otherwise within normal limits. Other: No free air or fluid. Musculoskeletal: Small intramuscular lipoma noted within the left abdominus musculature. No acute osseous abnormality. No worrisome lytic or blastic osseous lesions. IMPRESSION: 1. 4.4 x 5.7 x 7.5 cm pseudoaneurysm arising from the left aspect of a fem-fem bypass graft in the left inguinal region. Finding suggestive of graft failure. Superimposed infection not excluded. 2. Extensive atherosclerotic calcifications throughout the intra-abdominal aorta and its branch vessels. 3. 6 mm calculus at the right UVJ with secondary mild right hydroureteronephrosis. Additional nonobstructive right renal nephrolithiasis as above. 4. Large volume stool within the rectal vault, suggesting constipation. Critical Value/emergent results were called by telephone at the time of interpretation on 08/29/2016 at 11:29 pm to Dr. Gareth Morgan , who verbally acknowledged these results. Electronically Signed   By: Jeannine Boga  M.D.   On: 08/29/2016 23:34   Ct Angio Ao+bifem W & Or Wo Contrast  Result Date: 09/07/2016 CLINICAL DATA:  Status post repair of left femoral pseudoaneurysm with revision of right the left femoral- femoral bypass graft and left femoral-popliteal bypass graft. There has been some oozing of blood from the left groin operative site. EXAM: CT ANGIOGRAPHY OF ABDOMINAL AORTA WITH ILIOFEMORAL RUNOFF TECHNIQUE: Multidetector CT imaging of the abdomen, pelvis and lower extremities was performed using the standard protocol during bolus administration of intravenous contrast. Multiplanar CT image reconstructions and MIPs were obtained to evaluate the vascular anatomy. CONTRAST:  100 mL Isovue 370 IV COMPARISON:  CT of the abdomen and pelvis on 08/29/2016 FINDINGS: VASCULAR Aorta: The abdominal aorta and shows stable patency and atherosclerotic plaque. No evidence of aneurysmal disease or significant aortic stenosis. Celiac: Calcified plaque causes approximately 40- 50% origin stenosis. SMA: Calcified plaque causes approximately 50% origin stenosis. Renals: Bilateral single renal arteries without significant stenosis. IMA: Origin patent. The proximal trunk shows heavily calcified plaque without occlusion. RIGHT Lower Extremity Inflow: Calcified common iliac artery without significant stenosis. Internal and external iliac arteries show diffuse disease. Maximal narrowing of the proximal external iliac artery approaches 50%. Outflow: Calcified plaque causes focal narrowing of the common femoral artery just prior to the femoral-femoral bypass graft. The artery is narrowed approximately 60- 70%. The femoral-femoral graft is patent but is poorly opacified distally in the left groin. There is some thickening around the distal graft extending superiorly and abutting the abdominal wall. There likely is some postoperative hemorrhage in this region as well as a few foci of air. No findings to suggest focal abscess. The native right  superficial femoral artery is chronically occluded. Profunda femoral artery is patent. At the level of the popliteal artery, there is poor arterial opacification and popliteal patency cannot be adequately assessed. Runoff: Patency of runoff below the right knee cannot be adequately assessed, due to poor opacification of distal vessels. 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 Internal and external iliac arteries are also chronically occluded. At the level of the left groin, the distal aspect of the femoral- femoral bypass graft is poorly opacified and there is evidence now of a new synthetic femoral-popliteal bypass graft which is poorly opacified. Outflow: Unable to assess distal bypass graft and native popliteal artery patency. Runoff: Unable to assess runoff artery patency. Review of the MIP images confirms the above findings. NON-VASCULAR Lower chest: Bibasilar scarring and small bilateral pleural effusions, right greater than left. Hepatobiliary: No focal liver abnormality is seen. No gallstones, gallbladder wall thickening, or biliary dilatation. Pancreas: Unremarkable.  No pancreatic ductal dilatation or surrounding inflammatory changes. Spleen: Normal in size without focal abnormality. Adrenals/Urinary Tract: Adrenal glands are unremarkable. Stable nonobstructing right renal calculi. Stable lower pole renal cyst on the left. Stable small right-sided bladder calculus. Stomach/Bowel: No evidence of bowel obstruction or ileus. There is a fairly large amount of fecal material located in the rectum. No free air. Lymphatic: No enlarged lymph nodes identified. Reproductive: Uterus demonstrates calcified degenerated fibroids. No adnexal masses. Other: No free fluid or hernias. Musculoskeletal: Degenerative disc disease at L5-S1. IMPRESSION: VASCULAR 1. Poorly opacified distal segment of the right to left femoral- femoral bypass graft and poorly  opacified new left femoral to popliteal bypass graft. Although some of this is felt to relate to contrast timing, there would be some concern of the potential for imminent thrombosis and vascular surgical assessment is recommended. The previously identified left groin pseudoaneurysm has been resected and repaired and there is no evidence of recurrent pseudoaneurysm or active contrast extravasation. Some postoperative changes are evident around the graft likely representing some hemorrhage that extends superiorly abutting the abdominal wall. 2. The native right common femoral artery does demonstrate significant disease and focal moderate stenosis just proximal to the bypass graft anastomosis which could account for some potential inflow restriction into the femoral-femoral graft. 3. Poor opacification at the level of bilateral popliteal and tibial arteries on the CTA results and lack of accurate patency assessment of these vessels. NON-VASCULAR 1. Stable nonobstructing right renal calculi and bladder calculus. 2. Fairly large amount of stool is present in the rectum. Electronically Signed   By: Aletta Edouard M.D.   On: 09/07/2016 16:06    Time Spent in minutes  25   Louellen Molder M.D on 09/08/2016 at 2:38 PM  Between 7am to 7pm - Pager - [phone]  After 7pm go to [email or web address] - password [facility]  [facility] -  Office  [phone]

## 2016-09-08 NOTE — Progress Notes (Signed)
  Echocardiogram 2D Echocardiogram has been performed.  Casey [name] 09/08/2016, 3:18 PM

## 2016-09-08 NOTE — Progress Notes (Signed)
  PHARMACY - PHYSICIAN COMMUNICATION CRITICAL VALUE ALERT - BLOOD CULTURE IDENTIFICATION (BCID)  Results for orders placed or performed during the hospital encounter of 09/07/16  Blood Culture ID Panel (Reflexed) (Collected: 09/07/2016  4:15 PM)  Result Value Ref Range   Enterococcus species NOT DETECTED NOT DETECTED   Vancomycin resistance NOT DETECTED NOT DETECTED   Listeria monocytogenes NOT DETECTED NOT DETECTED   Staphylococcus species DETECTED (A) NOT DETECTED   Staphylococcus aureus NOT DETECTED NOT DETECTED   Methicillin resistance NOT DETECTED NOT DETECTED   Streptococcus species NOT DETECTED NOT DETECTED   Streptococcus agalactiae NOT DETECTED NOT DETECTED   Streptococcus pneumoniae NOT DETECTED NOT DETECTED   Streptococcus pyogenes NOT DETECTED NOT DETECTED   Acinetobacter baumannii NOT DETECTED NOT DETECTED   Enterobacteriaceae species NOT DETECTED NOT DETECTED   Enterobacter cloacae complex NOT DETECTED NOT DETECTED   Escherichia coli NOT DETECTED NOT DETECTED   Klebsiella oxytoca NOT DETECTED NOT DETECTED   Klebsiella pneumoniae NOT DETECTED NOT DETECTED   Proteus species NOT DETECTED NOT DETECTED   Serratia marcescens NOT DETECTED NOT DETECTED   Carbapenem resistance NOT DETECTED NOT DETECTED   Haemophilus influenzae NOT DETECTED NOT DETECTED   Neisseria meningitidis NOT DETECTED NOT DETECTED   Pseudomonas aeruginosa NOT DETECTED NOT DETECTED   Candida albicans NOT DETECTED NOT DETECTED   Candida glabrata NOT DETECTED NOT DETECTED   Candida krusei NOT DETECTED NOT DETECTED   Candida parapsilosis NOT DETECTED NOT DETECTED   Candida tropicalis NOT DETECTED NOT DETECTED   1/2 BCx with CONS - likely contaminant. Currently on vanc/cefepime for cellulitis.  Name of physician (or Provider) Contacted: Dhungel (text-paged)  Changes to prescribed antibiotics required: none  Elicia Lamp, PharmD, BCPS Clinical Pharmacist Rx Phone # for today: [phone] After 3:30PM, please  call Main Rx: [phone] 09/08/2016 4:12 PM

## 2016-09-09 DIAGNOSIS — E1140 Type 2 diabetes mellitus with diabetic neuropathy, unspecified: Secondary | ICD-10-CM

## 2016-09-09 LAB — GLUCOSE, CAPILLARY
GLUCOSE-CAPILLARY: 152 mg/dL — AB (ref 65–99)
GLUCOSE-CAPILLARY: 180 mg/dL — AB (ref 65–99)
Glucose-Capillary: 164 mg/dL — ABNORMAL HIGH (ref 65–99)

## 2016-09-09 LAB — BASIC METABOLIC PANEL
ANION GAP: 7 (ref 5–15)
BUN: 23 mg/dL — ABNORMAL HIGH (ref 6–20)
CHLORIDE: 107 mmol/L (ref 101–111)
CO2: 27 mmol/L (ref 22–32)
Calcium: 9.2 mg/dL (ref 8.9–10.3)
Creatinine, Ser: 1.5 mg/dL — ABNORMAL HIGH (ref 0.44–1.00)
GFR calc Af Amer: 38 mL/min — ABNORMAL LOW (ref >60)
GFR, EST NON AFRICAN AMERICAN: 33 mL/min — AB (ref >60)
Glucose, Bld: 181 mg/dL — ABNORMAL HIGH (ref 65–99)
POTASSIUM: 3.9 mmol/L (ref 3.5–5.1)
SODIUM: 141 mmol/L (ref 135–145)

## 2016-09-09 MED ORDER — OXYCODONE HCL 5 MG PO TABS: 5.0000 mg | ORAL_TABLET | Freq: Four times a day (QID) | ORAL | 0 refills | Status: DC | PRN

## 2016-09-09 MED ORDER — METOPROLOL TARTRATE 25 MG PO TABS: 50.0000 mg | ORAL_TABLET | Freq: Two times a day (BID) | ORAL | 0 refills | Status: DC

## 2016-09-09 MED ORDER — DOXYCYCLINE HYCLATE 100 MG PO TABS: 100.0000 mg | ORAL_TABLET | Freq: Two times a day (BID) | ORAL | 0 refills | Status: DC

## 2016-09-09 MED ORDER — DOXYCYCLINE HYCLATE 100 MG PO TABS
100.0000 mg | ORAL_TABLET | Freq: Two times a day (BID) | ORAL | Status: DC
  Administered 2016-09-09: 100 mg via ORAL
  Filled 2016-09-09: qty 1

## 2016-09-09 NOTE — Progress Notes (Signed)
Transportation received discharge information. RN called report to [facility]. Patient IV was removed.

## 2016-09-09 NOTE — Progress Notes (Signed)
  Progress Note    09/09/2016 9:58 AM * No surgery found *  Subjective:  No acute issues  Vitals:   09/09/16 0846 09/09/16 0848  BP: 118/74   Pulse: 67 75  Resp: (!) 22   Temp: 98.4 F (36.9 C)   SpO2: 100%     Physical Exam: Awake and alert left foot is warm prevena to suction left groin  CBC    Component Value Date/Time   WBC 10.5 09/08/2016 0242   RBC 3.68 (L) 09/08/2016 0242   HGB 10.0 (L) 09/08/2016 0242   HCT 31.2 (L) 09/08/2016 0242   PLT 268 09/08/2016 0242   MCV 84.8 09/08/2016 0242   MCH 27.2 09/08/2016 0242   MCHC 32.1 09/08/2016 0242   RDW 16.3 (H) 09/08/2016 0242   LYMPHSABS 1.0 09/07/2016 1136   MONOABS 0.5 09/07/2016 1136   EOSABS 0.1 09/07/2016 1136   BASOSABS 0.0 09/07/2016 1136    BMET    Component Value Date/Time   NA 141 09/09/2016 0245   NA 139 09/04/2016   K 3.9 09/09/2016 0245   CL 107 09/09/2016 0245   CO2 27 09/09/2016 0245   GLUCOSE 181 (H) 09/09/2016 0245   BUN 23 (H) 09/09/2016 0245   BUN 14 09/04/2016   CREATININE 1.50 (H) 09/09/2016 0245   CALCIUM 9.2 09/09/2016 0245   GFRNONAA 33 (L) 09/09/2016 0245   GFRAA 38 (L) 09/09/2016 0245    INR    Component Value Date/Time   INR 1.17 09/07/2016 1136     Intake/Output Summary (Last 24 hours) at 09/09/16 0958 Last data filed at 09/09/16 0934  Gross per 24 hour  Intake              846 ml  Output              400 ml  Net              446 ml     ASSESSMENT: This is a 76 y.o.females/p  #1: Repair of left femoral pseudoaneurysm #2: Redo left femoral artery exposure #3: Revision of right to left femoral-femoral bypass graft with interposition 8 mm dacron #4: Revision of left femoral-popliteal vein bypass graft with interposition Gore-Tex graft #5: Ligation of left common femoral artery #6: Left femoral  endarterectomy #7: Thrombectomy of left femoral popliteal bypass graft #8: Thrombectomy of right to left femoral-femoral bypass graft #9: Application of wound VAC  Now admitted with drainage from left groin and prevena applied  Plan: Ok for discharge from vascular standpoint  prevena can remain in place for 7 days to control drainage F/u scheduled Dr. Trula Slade 8.27  Erlene Quan C. Donzetta Matters, MD [facility] Office: [phone] Pager: [phone]  09/09/2016 9:58 AM

## 2016-09-09 NOTE — Discharge Summary (Addendum)
Physician Discharge Summary  AERILYNN GOIN FAO:[id number] DOB: 11/26/1940 DOA: 09/07/2016  PCP: Gildardo Cranker, DO  Admit date: 09/07/2016 Discharge date: 09/09/2016  Admitted From: skilled nursing facility Disposition: return to skilled nursing facility ([facility])  Recommendations for Outpatient Follow-up:  1. Follow up with MD at SNF in 1 week. Wound vac can remain in place for 7 days 2.  patient has appt with Dr Trula Slade on 8/27 at 12:15 pm 3. Patient will complete antibiotic course on 8/19.  4. Recheck renal function in 2-3 days.  Home Health:none Equipment/Devices: wound vac  Discharge Condition: fair CODE STATUS: full code Diet recommendation: Heart Healthy / Carb Modified    Discharge Diagnoses:  Principal Problem:   Atrial fibrillation with rapid ventricular response (HCC)   Active Problems:   PVD (peripheral vascular disease) (HCC)   Type 2 diabetes mellitus with diabetic neuropathy, without long-term current use of insulin (HCC)   Wound discharge   Anemia   Brief narrative/ HPI 76 year old female with severe dementia, history of CVA, A. fib (not on medication due to severe dementia and? Fall), history of seizures, cardiomyopathy with EF of 30-40%, recent repair of left femoral pseudoaneurysm sent from SNF with continued drainage from the wound. Placed on observation and vascular surgery consulted.  Hospital course Principal Problem:   Atrial fibrillation with rapid ventricular response (HCC) Received IV cardizem on admission. Increased her metoprolol dose to 50 mg bid and rate controlled. Electrolytes normal.   Not on anticoagulation possibly due to fall risks with severe dementia.  Active Problems: Left groin wound discharge Placed on empiric IV vancomycin and cefepime. Blood culture 1/2 growing staph species which I suspect is contaminant  Remains afebrile and no further wound  discharge noted. Vascular surgery consulted and applied  prevena  vac to groin wound. Recommend that t can stay for 7 days and pt will follow up with Dr Trula Slade on 8/27. I will discharge her on oral doxycycline for 7 days.     PVD (peripheral vascular disease) ([place]) Recent repair of left femoral pseudoaneurysm, revision of bilateral femorofemoral bypass graft. Continue aspirin and statin.    Type 2 diabetes mellitus with diabetic neuropathy, without long-term current use of insulin (HCC) Stable on sliding scale coverage.  Essential hypertension Continue amlodipine, metoprolol and hydralazine.  History of seizures Continue Keppra    Severe dementia Continue Aricept.  Protein calorie malnutrition, moderate Continue supplements  Acute kidney injury Mild. Possibly due to some dehydration. Check renal function and 2-3 days.   Family Communication  : None at bedside  Disposition Plan  : Discharge to SNF   Consults  :  Vascular surgery  Procedures  : None   Discharge Instructions   Allergies as of 09/09/2016      Reactions   Penicillins Other (See Comments)   Told by a doctor to "not take" and on MAR as an allergy Has patient had a PCN reaction causing immediate rash, facial/tongue/throat swelling, SOB or lightheadedness with hypotension: Unknown Has patient had a PCN reaction causing severe rash involving mucus membranes or skin necrosis: Unknown Has patient had a PCN reaction that required hospitalization: Unknown Has patient had a PCN reaction occurring within the last 10 years: Unknown If all of the above answers are "NO", then may proceed with Ceph      Medication List    STOP taking these medications   levofloxacin 250 MG tablet Commonly known as:  LEVAQUIN     TAKE these medications  acetaminophen 325 MG tablet Commonly known as:  TYLENOL Take 2 tablets (650 mg total) by mouth every 6 (six) hours as needed for mild pain (or Fever >/= 101).   allopurinol 100 MG tablet Commonly known as:  ZYLOPRIM Take 100 mg  by mouth daily.   amLODipine 2.5 MG tablet Commonly known as:  NORVASC Take 2.5 mg by mouth daily.   aspirin 81 MG tablet Take 81 mg by mouth daily.   atorvastatin 10 MG tablet Commonly known as:  LIPITOR Take 10 mg by mouth at bedtime.   CETYLPYRIDINIUM CHLORIDE MT Give 15 ml by mouth two times a day for oral irritation   donepezil 10 MG tablet Commonly known as:  ARICEPT Take 1 tablet (10 mg total) by mouth at bedtime.   doxycycline 100 MG tablet Commonly known as:  VIBRA-TABS Take 1 tablet (100 mg total) by mouth every 12 (twelve) hours.   guaiFENesin-dextromethorphan 100-10 MG/5ML syrup Commonly known as:  ROBITUSSIN DM Take 15 mLs by mouth every 4 (four) hours as needed for cough.   hydrALAZINE 25 MG tablet Commonly known as:  APRESOLINE Take 1 tablet (25 mg total) by mouth every 6 (six) hours as needed (SBP > 170). What changed:  reasons to take this   levETIRAcetam 250 MG tablet Commonly known as:  KEPPRA Take 250 mg by mouth 2 (two) times daily.   metoprolol tartrate 25 MG tablet Commonly known as:  LOPRESSOR Take 2 tablets (50 mg total) by mouth 2 (two) times daily. 50 mg in the morning and 25 mg at bedtime for A-FIB What changed:  how much to take  when to take this   mirtazapine 7.5 MG tablet Commonly known as:  REMERON Take 7.5 mg by mouth at bedtime.   oxyCODONE 5 MG immediate release tablet Commonly known as:  Oxy IR/ROXICODONE Take 1 tablet (5 mg total) by mouth every 6 (six) hours as needed for severe pain. What changed:  when to take this  reasons to take this  additional instructions  Another medication with the same name was removed. Continue taking this medication, and follow the directions you see here.   pantoprazole 40 MG tablet Commonly known as:  PROTONIX Take 40 mg by mouth daily.   sertraline 50 MG tablet Commonly known as:  ZOLOFT Take 50 mg by mouth at bedtime.      Follow-up Information    Serafina Mitchell, MD  Follow up on 09/24/2016.   Specialties:  Vascular Surgery, Cardiology Why:  12:15 PM Contact information: [place] [id number] [phone]        MD at SNF in 1 week Follow up.          Allergies  Allergen Reactions  . Penicillins Other (See Comments)    Told by a doctor to "not take" and on MAR as an allergy Has patient had a PCN reaction causing immediate rash, facial/tongue/throat swelling, SOB or lightheadedness with hypotension: Unknown Has patient had a PCN reaction causing severe rash involving mucus membranes or skin necrosis: Unknown Has patient had a PCN reaction that required hospitalization: Unknown Has patient had a PCN reaction occurring within the last 10 years: Unknown If all of the above answers are "NO", then may proceed with Ceph       Procedures/Studies: Dg Chest 2 View  Result Date: 09/07/2016 CLINICAL DATA:  Tachycardia today. EXAM: CHEST  2 VIEW COMPARISON:  CT chest 01/10/2015.  PA and lateral chest 12/06/2011. FINDINGS: There is  cardiomegaly without edema. Fullness of the right hilum consistent with lymphadenopathy as seen on the prior CT scan is unchanged. The lungs are clear. There is no pneumothorax. Small bilateral pleural effusions are seen. Aortic atherosclerosis is noted. IMPRESSION: Small bilateral pleural effusions. Cardiomegaly without edema. Atherosclerosis. Fullness of the right hilum consistent with lymphadenopathy as seen on prior CT scan is unchanged. Electronically Signed   By: Inge Rise M.D.   On: 09/07/2016 12:17   Ct Abdomen Pelvis W Contrast  Result Date: 08/29/2016 CLINICAL DATA:  Initial evaluation for acute abdominal pain, left rolling mass. EXAM: CT ABDOMEN AND PELVIS WITH CONTRAST TECHNIQUE: Multidetector CT imaging of the abdomen and pelvis was performed using the standard protocol following bolus administration of intravenous contrast. CONTRAST:  142mL ISOVUE-300 IOPAMIDOL (ISOVUE-300) INJECTION 61%  COMPARISON:  Prior CT from 12/04/2011. FINDINGS: Lower chest: Scattered atelectatic changes noted within the visualized lung bases. Cardiomegaly partially visualized. No pleural or pericardial effusion. Hepatobiliary: Liver demonstrates a normal contrast enhanced appearance. Gallbladder within normal limits. No biliary dilatation. Pancreas: Pancreas somewhat atrophic but otherwise unremarkable without acute inflammation or mass lesion. Spleen: Subcentimeter hypodensity noted within central aspect of the spleen, of doubtful significance. Spleen otherwise unremarkable. Adrenals/Urinary Tract: Adrenal glands within normal limits. Kidneys relatively equal in size with symmetric enhancement. Scattered cortical thinning and scarring. 5.6 cm cyst noted extending from the lower pole left kidney. Additional scattered subcentimeter hypodensities within the bilateral kidneys too small the characterize, but statistically likely reflects small cysts as well. Multiple nonobstructive calculi present within the right kidney, largest of which positioned within the lower pole and measures approximately 15 mm. Mild right hydroureteronephrosis. There is an obstructive 6 mm calculus at the right UVJ (series 2, image 64). No left-sided renal calculi. No left-sided hydronephrosis or hydroureter. Bladder largely decompressed. Mild circumferential bladder wall thickening like related incomplete distension. Stomach/Bowel: Stomach within normal limits. No evidence for bowel obstruction. Appendix within normal limits. No acute inflammatory changes seen about the bowels. Large volume stool seen packed within the rectal vault, suggesting constipation. Vascular/Lymphatic: Extensive atherosclerosis throughout the intra-abdominal aorta and its branch vessels. Origin of the celiac axis, SMA, renal arteries, and IMA are grossly patent. Occlusion of the left common iliac just distal to its origin (series 2, image 42). Left internal iliac occluded  proximally, with distal reconstitution likely via collateralization. Occluded vascular stent in place within the left external iliac artery. Extensive atherosclerotic disease throughout the right iliac system. Fem-fem bypass graft in place. That right common femoral anastomosis appears patent. Large pseudoaneurysm measuring 4.4 x 5.7 x 7.5 cm arises from the left common femoral anastomosis in the left inguinal region (series 2, image 63). Opacification of the left common femoral artery distally. Reproductive: Scattered calcified uterine fibroids noted. Uterus and ovaries otherwise within normal limits. Other: No free air or fluid. Musculoskeletal: Small intramuscular lipoma noted within the left abdominus musculature. No acute osseous abnormality. No worrisome lytic or blastic osseous lesions. IMPRESSION: 1. 4.4 x 5.7 x 7.5 cm pseudoaneurysm arising from the left aspect of a fem-fem bypass graft in the left inguinal region. Finding suggestive of graft failure. Superimposed infection not excluded. 2. Extensive atherosclerotic calcifications throughout the intra-abdominal aorta and its branch vessels. 3. 6 mm calculus at the right UVJ with secondary mild right hydroureteronephrosis. Additional nonobstructive right renal nephrolithiasis as above. 4. Large volume stool within the rectal vault, suggesting constipation. Critical Value/emergent results were called by telephone at the time of interpretation on 08/29/2016 at 11:29 pm to Dr.  ERIN SCHLOSSMAN , who verbally acknowledged these results. Electronically Signed   By: Jeannine Boga M.D.   On: 08/29/2016 23:34   Ct Angio Ao+bifem W & Or Wo Contrast  Result Date: 09/07/2016 CLINICAL DATA:  Status post repair of left femoral pseudoaneurysm with revision of right the left femoral- femoral bypass graft and left femoral-popliteal bypass graft. There has been some oozing of blood from the left groin operative site. EXAM: CT ANGIOGRAPHY OF ABDOMINAL AORTA WITH  ILIOFEMORAL RUNOFF TECHNIQUE: Multidetector CT imaging of the abdomen, pelvis and lower extremities was performed using the standard protocol during bolus administration of intravenous contrast. Multiplanar CT image reconstructions and MIPs were obtained to evaluate the vascular anatomy. CONTRAST:  100 mL Isovue 370 IV COMPARISON:  CT of the abdomen and pelvis on 08/29/2016 FINDINGS: VASCULAR Aorta: The abdominal aorta and shows stable patency and atherosclerotic plaque. No evidence of aneurysmal disease or significant aortic stenosis. Celiac: Calcified plaque causes approximately 40- 50% origin stenosis. SMA: Calcified plaque causes approximately 50% origin stenosis. Renals: Bilateral single renal arteries without significant stenosis. IMA: Origin patent. The proximal trunk shows heavily calcified plaque without occlusion. RIGHT Lower Extremity Inflow: Calcified common iliac artery without significant stenosis. Internal and external iliac arteries show diffuse disease. Maximal narrowing of the proximal external iliac artery approaches 50%. Outflow: Calcified plaque causes focal narrowing of the common femoral artery just prior to the femoral-femoral bypass graft. The artery is narrowed approximately 60- 70%. The femoral-femoral graft is patent but is poorly opacified distally in the left groin. There is some thickening around the distal graft extending superiorly and abutting the abdominal wall. There likely is some postoperative hemorrhage in this region as well as a few foci of air. No findings to suggest focal abscess. 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 Profunda femoral artery is patent. At the level of the popliteal artery, there is poor arterial opacification and popliteal patency cannot be adequately assessed. Runoff: Patency of runoff below the right knee cannot be adequately assessed, due to poor opacification of distal vessels. LEFT Lower Extremity Inflow: The left common  iliac artery is chronically occluded just beyond its origin. Internal and external iliac arteries are also chronically occluded. At the level of the left groin, the distal aspect of the femoral- femoral bypass graft is poorly opacified and there is evidence now of a new synthetic femoral-popliteal bypass graft which is poorly opacified. Outflow: Unable to assess distal bypass graft and native popliteal artery patency. Runoff: Unable to assess runoff artery patency. Review of the MIP images confirms the above findings. NON-VASCULAR Lower chest: Bibasilar scarring and small bilateral pleural effusions, right greater than left. Hepatobiliary: No focal liver abnormality is seen. No gallstones, gallbladder wall thickening, or biliary dilatation. Pancreas: Unremarkable. No pancreatic ductal dilatation or surrounding inflammatory changes. Spleen: Normal in size without focal abnormality. Adrenals/Urinary Tract: Adrenal glands are unremarkable. Stable nonobstructing right renal calculi. Stable lower pole renal cyst on the left. Stable small right-sided bladder calculus. Stomach/Bowel: No evidence of bowel obstruction or ileus. There is a fairly large amount of fecal material located in the rectum. No free air. Lymphatic: No enlarged lymph nodes identified. Reproductive: Uterus demonstrates calcified degenerated fibroids. No adnexal masses. Other: No free fluid or hernias. Musculoskeletal: Degenerative disc disease at L5-S1. IMPRESSION: VASCULAR 1. Poorly opacified distal segment of the right to left femoral- femoral bypass graft and poorly opacified new left femoral to popliteal bypass graft. Although some of this is felt to relate to contrast  timing, there would be some concern of the potential for imminent thrombosis and vascular surgical assessment is recommended. The previously identified left groin pseudoaneurysm has been resected and repaired and there is no evidence of recurrent pseudoaneurysm or active contrast  extravasation. Some postoperative changes are evident around the graft likely representing some hemorrhage that extends superiorly abutting the abdominal wall. 2. The native right common femoral artery does demonstrate significant disease and focal moderate stenosis just proximal to the bypass graft anastomosis which could account for some potential inflow restriction into the femoral-femoral graft. 3. Poor opacification at the level of bilateral popliteal and tibial arteries on the CTA results and lack of accurate patency assessment of these vessels. NON-VASCULAR 1. Stable nonobstructing right renal calculi and bladder calculus. 2. Fairly large amount of stool is present in the rectum. Electronically Signed   By: Aletta Edouard M.D.   On: 09/07/2016 16:06       Subjective: HR stable on monitor with few PVCs. No overnight events  Discharge Exam: Vitals:   09/09/16 0846 09/09/16 0848  BP: 118/74   Pulse: 67 75  Resp: (!) 22   Temp: 98.4 F (36.9 C)   SpO2: 100%    Vitals:   09/08/16 2202 09/09/16 0514 09/09/16 0846 09/09/16 0848  BP: (!) 145/79  118/74   Pulse:   67 75  Resp:   (!) 22   Temp: 97.7 F (36.5 C) 98.7 F (37.1 C) 98.4 F (36.9 C)   TempSrc: Oral Oral Oral   SpO2:   100%   Weight:  57.6 kg (126 lb 15.8 oz)    Height:        Gen: Elderly female not in distress, confused HEENT:  moist mucosa, supple neck Chest: clear b/l, no added sounds CVS:  S1&S2 irregularly irregular, no murmurs, rubs or gallop GI: soft, NT, ND,  Musculoskeletal: warm, wound vac over left groin   The results of significant diagnostics from this hospitalization (including imaging, microbiology, ancillary and laboratory) are listed below for reference.     Microbiology: Recent Results (from the past 240 hour(s))  Blood Culture (routine x 2)     Status: None (Preliminary result)   Collection Time: 09/07/16  4:15 PM  Result Value Ref Range Status   Specimen Description BLOOD LEFT  ANTECUBITAL  Final   Special Requests   Final    BOTTLES DRAWN AEROBIC AND ANAEROBIC Blood Culture adequate volume   Culture  Setup Time   Final    GRAM POSITIVE COCCI IN CLUSTERS IN BOTH AEROBIC AND ANAEROBIC BOTTLES Organism ID to follow CRITICAL RESULT CALLED TO, READ BACK BY AND VERIFIED WITH: H BAIRD 09/08/16 @ 42 M VESTAL    Culture GRAM POSITIVE COCCI  Final   Report Status PENDING  Incomplete  Blood Culture ID Panel (Reflexed)     Status: Abnormal   Collection Time: 09/07/16  4:15 PM  Result Value Ref Range Status   Enterococcus species NOT DETECTED NOT DETECTED Final   Vancomycin resistance NOT DETECTED NOT DETECTED Final   Listeria monocytogenes NOT DETECTED NOT DETECTED Final   Staphylococcus species DETECTED (A) NOT DETECTED Final    Comment: CRITICAL RESULT CALLED TO, READ BACK BY AND VERIFIED WITH: H BAIRD 09/08/16 @ 1515 M VESTAL    Staphylococcus aureus NOT DETECTED NOT DETECTED Final   Methicillin resistance NOT DETECTED NOT DETECTED Final   Streptococcus species NOT DETECTED NOT DETECTED Final   Streptococcus agalactiae NOT DETECTED NOT DETECTED Final   Streptococcus  pneumoniae NOT DETECTED NOT DETECTED Final   Streptococcus pyogenes NOT DETECTED NOT DETECTED Final   Acinetobacter baumannii NOT DETECTED NOT DETECTED Final   Enterobacteriaceae species NOT DETECTED NOT DETECTED Final   Enterobacter cloacae complex NOT DETECTED NOT DETECTED Final   Escherichia coli NOT DETECTED NOT DETECTED Final   Klebsiella oxytoca NOT DETECTED NOT DETECTED Final   Klebsiella pneumoniae NOT DETECTED NOT DETECTED Final   Proteus species NOT DETECTED NOT DETECTED Final   Serratia marcescens NOT DETECTED NOT DETECTED Final   Carbapenem resistance NOT DETECTED NOT DETECTED Final   Haemophilus influenzae NOT DETECTED NOT DETECTED Final   Neisseria meningitidis NOT DETECTED NOT DETECTED Final   Pseudomonas aeruginosa NOT DETECTED NOT DETECTED Final   Candida albicans NOT DETECTED NOT  DETECTED Final   Candida glabrata NOT DETECTED NOT DETECTED Final   Candida krusei NOT DETECTED NOT DETECTED Final   Candida parapsilosis NOT DETECTED NOT DETECTED Final   Candida tropicalis NOT DETECTED NOT DETECTED Final  Blood Culture (routine x 2)     Status: None (Preliminary result)   Collection Time: 09/07/16  5:05 PM  Result Value Ref Range Status   Specimen Description BLOOD RIGHT HAND  Final   Special Requests IN PEDIATRIC BOTTLE Blood Culture adequate volume  Final   Culture NO GROWTH < 24 HOURS  Final   Report Status PENDING  Incomplete     Labs: BNP (last 3 results) No results for input(s): BNP in the last 8760 hours. Basic Metabolic Panel:  Recent Labs Lab 09/04/16 09/07/16 1136 09/08/16 0242 09/09/16 0245  NA 139 141 142 141  K 3.7 3.8 4.2 3.9  CL  --  106 107 107  CO2  --  27 24 27   GLUCOSE  --  279* 142* 181*  BUN 14 13 14  23*  CREATININE 1.1 1.28* 1.20* 1.50*  CALCIUM  --  9.2 9.4 9.2   Liver Function Tests:  Recent Labs Lab 09/04/16 09/08/16 0242  AST 9* 16  ALT 6* 9*  ALKPHOS 76 66  BILITOT  --  0.9  PROT  --  6.5  ALBUMIN  --  3.0*   No results for input(s): LIPASE, AMYLASE in the last 168 hours. No results for input(s): AMMONIA in the last 168 hours. CBC:  Recent Labs Lab 09/04/16 09/07/16 1136 09/08/16 0242  WBC 10.2 9.4 10.5  NEUTROABS 8 7.8*  --   HGB 10.5* 9.7* 10.0*  HCT 32* 29.8* 31.2*  MCV  --  85.1 84.8  PLT 220 293 268   Cardiac Enzymes:  Recent Labs Lab 09/07/16 1136 09/07/16 1505 09/07/16 2016 09/08/16 0242 09/08/16 0729  TROPONINI 0.03* 0.03* 0.03* 0.03* 0.03*   BNP: Invalid input(s): POCBNP CBG:  Recent Labs Lab 09/08/16 0751 09/08/16 1144 09/08/16 1627 09/08/16 2158 09/09/16 0715  GLUCAP 136* 232* 128* 154* 152*   D-Dimer No results for input(s): DDIMER in the last 72 hours. Hgb A1c No results for input(s): HGBA1C in the last 72 hours. Lipid Profile No results for input(s): CHOL, HDL, LDLCALC,  TRIG, CHOLHDL, LDLDIRECT in the last 72 hours. Thyroid function studies No results for input(s): TSH, T4TOTAL, T3FREE, THYROIDAB in the last 72 hours.  Invalid input(s): FREET3 Anemia work up No results for input(s): VITAMINB12, FOLATE, FERRITIN, TIBC, IRON, RETICCTPCT in the last 72 hours. Urinalysis    Component Value Date/Time   COLORURINE YELLOW 08/29/2016 1922   APPEARANCEUR CLEAR 08/29/2016 1922   LABSPEC 1.012 08/29/2016 1922   PHURINE 7.0 08/29/2016 1922  GLUCOSEU NEGATIVE 08/29/2016 1922   HGBUR SMALL (A) 08/29/2016 [place] NEGATIVE 08/29/2016 [place] NEGATIVE 08/29/2016 1922   PROTEINUR 100 (A) 08/29/2016 1922   UROBILINOGEN 0.2 02/02/2013 2008   NITRITE NEGATIVE 08/29/2016 1922   LEUKOCYTESUR TRACE (A) 08/29/2016 1922   Sepsis Labs Invalid input(s): PROCALCITONIN,  WBC,  LACTICIDVEN Microbiology Recent Results (from the past 240 hour(s))  Blood Culture (routine x 2)     Status: None (Preliminary result)   Collection Time: 09/07/16  4:15 PM  Result Value Ref Range Status   Specimen Description BLOOD LEFT ANTECUBITAL  Final   Special Requests   Final    BOTTLES DRAWN AEROBIC AND ANAEROBIC Blood Culture adequate volume   Culture  Setup Time   Final    GRAM POSITIVE COCCI IN CLUSTERS IN BOTH AEROBIC AND ANAEROBIC BOTTLES Organism ID to follow CRITICAL RESULT CALLED TO, READ BACK BY AND VERIFIED WITH: H BAIRD 09/08/16 @ 39 M VESTAL    Culture GRAM POSITIVE COCCI  Final   Report Status PENDING  Incomplete  Blood Culture ID Panel (Reflexed)     Status: Abnormal   Collection Time: 09/07/16  4:15 PM  Result Value Ref Range Status   Enterococcus species NOT DETECTED NOT DETECTED Final   Vancomycin resistance NOT DETECTED NOT DETECTED Final   Listeria monocytogenes NOT DETECTED NOT DETECTED Final   Staphylococcus species DETECTED (A) NOT DETECTED Final    Comment: CRITICAL RESULT CALLED TO, READ BACK BY AND VERIFIED WITH: H BAIRD 09/08/16 @ 18 M  VESTAL    Staphylococcus aureus NOT DETECTED NOT DETECTED Final   Methicillin resistance NOT DETECTED NOT DETECTED Final   Streptococcus species NOT DETECTED NOT DETECTED Final   Streptococcus agalactiae NOT DETECTED NOT DETECTED Final   Streptococcus pneumoniae NOT DETECTED NOT DETECTED Final   Streptococcus pyogenes NOT DETECTED NOT DETECTED Final   Acinetobacter baumannii NOT DETECTED NOT DETECTED Final   Enterobacteriaceae species NOT DETECTED NOT DETECTED Final   Enterobacter cloacae complex NOT DETECTED NOT DETECTED Final   Escherichia coli NOT DETECTED NOT DETECTED Final   Klebsiella oxytoca NOT DETECTED NOT DETECTED Final   Klebsiella pneumoniae NOT DETECTED NOT DETECTED Final   Proteus species NOT DETECTED NOT DETECTED Final   Serratia marcescens NOT DETECTED NOT DETECTED Final   Carbapenem resistance NOT DETECTED NOT DETECTED Final   Haemophilus influenzae NOT DETECTED NOT DETECTED Final   Neisseria meningitidis NOT DETECTED NOT DETECTED Final   Pseudomonas aeruginosa NOT DETECTED NOT DETECTED Final   Candida albicans NOT DETECTED NOT DETECTED Final   Candida glabrata NOT DETECTED NOT DETECTED Final   Candida krusei NOT DETECTED NOT DETECTED Final   Candida parapsilosis NOT DETECTED NOT DETECTED Final   Candida tropicalis NOT DETECTED NOT DETECTED Final  Blood Culture (routine x 2)     Status: None (Preliminary result)   Collection Time: 09/07/16  5:05 PM  Result Value Ref Range Status   Specimen Description BLOOD RIGHT HAND  Final   Special Requests IN PEDIATRIC BOTTLE Blood Culture adequate volume  Final   Culture NO GROWTH < 24 HOURS  Final   Report Status PENDING  Incomplete     Time coordinating discharge:>30 minutes  SIGNED:   Louellen Molder, MD  [facility] 09/09/2016, 9:42 AM Pager   If 7PM-7AM, please contact night-coverage [email or web address] Password [email or web address]

## 2016-09-09 NOTE — Consult Note (Signed)
[place] Nurse wound consult note Reason for Consult: Placement of Incisional NPWT ordered yesterday morning; Dr. Donzetta Matters (VVS) placed yesterday afternoon. As dressing may remain in place for 7 days (per his note), WOC Nurse will not see routinely/follow. [place] nursing team will not follow, but will remain available to this patient, the nursing and medical teams.  Please re-consult if needed. Thanks, Maudie Flakes, MSN, RN, [place], Arther Abbott  Pager# [phone]

## 2016-09-09 NOTE — Clinical Social Work Placement (Signed)
   CLINICAL SOCIAL WORK PLACEMENT  NOTE  Date:  09/09/2016  Patient Details  Name: Casey [name] MRN: [id number] Date of Birth: Feb 13, 1940  Clinical Social Work is seeking post-discharge placement for this patient at the [place] level of care (*CSW will initial, date and re-position this form in  chart as items are completed):  Yes   Patient/family provided with [place] Work Department's list of facilities offering this level of care within the geographic area requested by the patient (or if unable, by the patient's family).  Yes   Patient/family informed of their freedom to choose among providers that offer the needed level of care, that participate in Medicare, Medicaid or managed care program needed by the patient, have an available bed and are willing to accept the patient.  Yes   Patient/family informed of [facility]'s ownership interest in [facility] and [facility], as well as of the fact that they are under no obligation to receive care at these facilities.  PASRR submitted to EDS on       PASRR number received on       Existing PASRR number confirmed on 09/02/16     FL2 transmitted to all facilities in geographic area requested by pt/family on 09/02/16     FL2 transmitted to all facilities within larger geographic area on       Patient informed that his/her managed care company has contracts with or will negotiate with certain facilities, including the following:        Yes   Patient/family informed of bed offers received.  Patient chooses bed at  (Pt from [place])     Physician recommends and patient chooses bed at      Patient to be transferred to  (Pt from [place]) on 09/09/16.  Patient to be transferred to facility by [facility]     Patient family notified on 09/09/16 of transfer.  Name of family member notified:  Lupita Leash     PHYSICIAN       Additional Comment:     _______________________________________________ Serafina Mitchell, [place] 09/09/2016, 11:45 AM

## 2016-09-09 NOTE — Clinical Social Work Note (Signed)
Clinical Social Worker facilitated patient discharge including contacting patient family and facility to confirm patient discharge plans.  Clinical information faxed to facility and family agreeable with plan.  CSW arranged ambulance transport via [facility] to [facility]. RN to call report prior to discharge.  Clinical Social Worker will sign off for now as social work intervention is no longer needed. Please consult [place] again if new need arises.  Natiya Seelinger B. Joline Maxcy Clinical Social Work Dept Weekend Social Worker [phone] 11:44 AM

## 2016-09-10 ENCOUNTER — Non-Acute Institutional Stay (SKILLED_NURSING_FACILITY): Payer: Medicare Other | Admitting: Adult Health

## 2016-09-10 ENCOUNTER — Encounter: Payer: Self-pay | Admitting: Adult Health

## 2016-09-10 ENCOUNTER — Telehealth: Payer: Self-pay

## 2016-09-10 DIAGNOSIS — I951 Orthostatic hypotension: Secondary | ICD-10-CM

## 2016-09-10 DIAGNOSIS — F0150 Vascular dementia without behavioral disturbance: Secondary | ICD-10-CM | POA: Diagnosis not present

## 2016-09-10 DIAGNOSIS — N183 Chronic kidney disease, stage 3 unspecified: Secondary | ICD-10-CM

## 2016-09-10 DIAGNOSIS — E785 Hyperlipidemia, unspecified: Secondary | ICD-10-CM

## 2016-09-10 DIAGNOSIS — E1169 Type 2 diabetes mellitus with other specified complication: Secondary | ICD-10-CM

## 2016-09-10 DIAGNOSIS — E1140 Type 2 diabetes mellitus with diabetic neuropathy, unspecified: Secondary | ICD-10-CM | POA: Diagnosis not present

## 2016-09-10 DIAGNOSIS — I5022 Chronic systolic (congestive) heart failure: Secondary | ICD-10-CM | POA: Diagnosis not present

## 2016-09-10 DIAGNOSIS — I4891 Unspecified atrial fibrillation: Secondary | ICD-10-CM

## 2016-09-10 DIAGNOSIS — I959 Hypotension, unspecified: Secondary | ICD-10-CM | POA: Insufficient documentation

## 2016-09-10 DIAGNOSIS — E1122 Type 2 diabetes mellitus with diabetic chronic kidney disease: Secondary | ICD-10-CM | POA: Diagnosis not present

## 2016-09-10 LAB — CULTURE, BLOOD (ROUTINE X 2): SPECIAL REQUESTS: ADEQUATE

## 2016-09-10 NOTE — Progress Notes (Signed)
Location:   [place] Room Number: 106 A Place of Service:  SNF (31)   CODE STATUS: Full Code  Allergies  Allergen Reactions  . Penicillins Other (See Comments)    Told by a doctor to "not take" and on MAR as an allergy Has patient had a PCN reaction causing immediate rash, facial/tongue/throat swelling, SOB or lightheadedness with hypotension: Unknown Has patient had a PCN reaction causing severe rash involving mucus membranes or skin necrosis: Unknown Has patient had a PCN reaction that required hospitalization: Unknown Has patient had a PCN reaction occurring within the last 10 years: Unknown If all of the above answers are "NO", then may proceed with [place]    Chief Complaint  Patient presents with  . Hospitalization Follow-up    hospital follow up    HPI:  She is a long term resident of this facility who has been hospitalized for afib with RVR had her lopressor was increased to 50 mg twice daily. She has a wound vac to her left groin suture line for the next 7 days. She is unable to participate with the hpi or ros. There are no nursing concerns at this time.    Past Medical History:  Diagnosis Date  . Abdominal or pelvic swelling, mass, or lump, left upper quadrant   . CHF (congestive heart failure) ([place])   . Chronic atrial fibrillation ([place])   . Chronic kidney disease   . Congestive heart failure, unspecified   . Coronary atherosclerosis of unspecified type of vessel, native or graft   . Diabetes mellitus   . Edema   . GERD (gastroesophageal reflux disease)   . Gout, unspecified   . Hypercholesterolemia   . Hypertension   . 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  . Peripheral vascular disease, unspecified ([place])   . Seizures ([place])   . Sinus of Valsalva aneurysm    a. By 2D echo 05/2011.  . Stroke  ([place])   . Unspecified hereditary and idiopathic peripheral neuropathy   . Unspecified urinary incontinence   . Vascular dementia, uncomplicated     Past Surgical History:  Procedure Laterality Date  . AORTOGRAM  09/01/2008   Abd w/ bilateral lower extremity runoff arteriography  . APPLICATION OF WOUND VAC Left 08/30/2016   Procedure: APPLICATION OF WOUND VAC LEFT GROIN;  Surgeon: Serafina Mitchell, MD;  Location: [place];  Service: Vascular;  Laterality: Left;  . ENDARTERECTOMY FEMORAL Left 08/30/2016   Procedure: LEFT COMMON FEMORAL ARTERY ENDARTERECTOMY;  Surgeon: Serafina Mitchell, MD;  Location: [place];  Service: Vascular;  Laterality: Left;  . FALSE ANEURYSM REPAIR  12/06/2011   Procedure: REPAIR FALSE ANEURYSM;  Surgeon: Angelia Mould, MD;  Location: [facility] OR;  Service: Vascular;  Laterality: Left;  Repair of left femoral Artery pseudoaneurysm  . FALSE ANEURYSM REPAIR Left 08/30/2016   Procedure: REPAIR PSEUDOANEURYSM LEFT GROIN;  Surgeon: Serafina Mitchell, MD;  Location: [place];  Service: Vascular;  Laterality: Left;  . FEMORAL ARTERY EXPLORATION  12/06/2011   Procedure: FEMORAL ARTERY EXPLORATION;  Surgeon: Angelia Mould, MD;  Location: [facility] OR;  Service: Vascular;  Laterality: N/A;  Exploration of large pseudoaneurysm left side of fem-fem bypass graft   . femoral to femoral bypass graft     . FEMORAL-FEMORAL BYPASS GRAFT  12/06/2011   Procedure: BYPASS GRAFT FEMORAL-FEMORAL ARTERY;  Surgeon: Angelia Mould, MD;  Location[facility];  Service: Vascular;  Laterality: Bilateral;  Revision of left to right Femoral-Femoral bypass graft  . FEMORAL-FEMORAL BYPASS GRAFT Left 08/30/2016   Procedure: REDO RIGHT TO LEFT FEMORAL-FEMORAL ARTERY BYPASS GRAFT;  Surgeon: Serafina Mitchell, MD;  Location: [place];  Service: Vascular;  Laterality: Left;  . FEMORAL-POPLITEAL BYPASS GRAFT Left 05/2002   lower extremity femoral to below knee w/ non-versed greater saphenous vein  . FEMORAL-POPLITEAL BYPASS  GRAFT Left 11/2002  . FEMORAL-POPLITEAL BYPASS GRAFT Left 08/30/2016   Procedure: REVISION OF LEFT FEMORAL-POPLITEAL ARTERY BYPASS GRAFT;  Surgeon: Serafina Mitchell, MD;  Location: [place];  Service: Vascular;  Laterality: Left;  . THROMBECTOMY FEMORAL ARTERY Left 08/30/2016   Procedure: THROMBECTOMY OF FEMORAL-FEMORAL ARTERY BYPASS GRAFT AND LEFT FEMORAL TO LEFT POPLITEAL BYPASS GRAFT;  Surgeon: Serafina Mitchell, MD;  Location: [place];  Service: Vascular;  Laterality: Left;  . TUBAL LIGATION Bilateral     Social History   Social History  . Marital status: Widowed    Spouse name: N/A  . Number of children: N/A  . Years of education: N/A   Occupational History  . Not on file.   Social History Main Topics  . Smoking status: Former Smoker    Types: Cigarettes    Quit date: 01/29/2001  . Smokeless tobacco: Never Used  . Alcohol use No  . Drug use: No  . Sexual activity: No   Other Topics Concern  . Not on file   Social History Narrative  . No narrative on file   Family History  Problem Relation Age of Onset  . Cancer Mother        Cervical  . Stroke Mother   . Diabetes Mother   . Stroke Father   . Heart disease Father       VITAL SIGNS BP 124/70   Pulse 84   Temp (!) 97.1 F (36.2 C)   Resp 18   Ht 5' (1.524 m)   Wt 123 lb 8 oz (56 kg)   SpO2 98%   BMI 24.12 kg/m   Patient's Medications  New Prescriptions   No medications on file  Previous Medications   ACETAMINOPHEN (TYLENOL) 325 MG TABLET    Take 2 tablets (650 mg total) by mouth every 6 (six) hours as needed for mild pain (or Fever >/= 101).   ALLOPURINOL (ZYLOPRIM) 100 MG TABLET    Take 100 mg by mouth daily.   ASPIRIN 81 MG TABLET    Take 81 mg by mouth daily.   ATORVASTATIN (LIPITOR) 10 MG TABLET    Take 10 mg by mouth at bedtime.    CETYLPYRIDINIUM CHLORIDE MT    Give 15 ml by mouth two times a day for oral irritation   DONEPEZIL (ARICEPT) 10 MG TABLET    Take 1 tablet (10 mg total) by mouth at bedtime.    DOXYCYCLINE (VIBRA-TABS) 100 MG TABLET    Take 1 tablet (100 mg total) by mouth every 12 (twelve) hours.   GUAIFENESIN-DEXTROMETHORPHAN (ROBITUSSIN DM) 100-10 MG/5ML SYRUP    Take 15 mLs by mouth every 4 (four) hours as needed for cough.   HYDRALAZINE (APRESOLINE) 25 MG TABLET    Take 25 mg by mouth every 6 (six) hours as needed.   LEVETIRACETAM (KEPPRA) 250 MG TABLET    Take 250 mg by mouth 2 (two) times daily.   METOPROLOL TARTRATE (LOPRESSOR) 25 MG TABLET    Give 2 tablets (  50mg ) by mouth in the morning and 1 tablet by mouth at bedtime   MIRTAZAPINE (REMERON) 7.5 MG TABLET    Take 7.5 mg by mouth at bedtime.   OXYCODONE (OXY IR/ROXICODONE) 5 MG IMMEDIATE RELEASE TABLET    Take 1 tablet (5 mg total) by mouth every 6 (six) hours as needed for severe pain.   PANTOPRAZOLE (PROTONIX) 40 MG TABLET    Take 40 mg by mouth daily.   SERTRALINE (ZOLOFT) 50 MG TABLET    Take 50 mg by mouth at bedtime.   Modified Medications   No medications on file  Discontinued Medications   AMLODIPINE (NORVASC) 2.5 MG TABLET    Take 2.5 mg by mouth daily.   HYDRALAZINE (APRESOLINE) 25 MG TABLET    Take 1 tablet (25 mg total) by mouth every 6 (six) hours as needed (SBP > 170).   METOPROLOL TARTRATE (LOPRESSOR) 25 MG TABLET    Take 2 tablets (50 mg total) by mouth 2 (two) times daily. 50 mg in the morning and 25 mg at bedtime for A-FIB     SIGNIFICANT DIAGNOSTIC EXAMS  PREVIOUS   05-21-14: ct of chest: 1. Right upper lobe 5 mm nodule unchanged. 2. Additional nodules identified, some of which are noncalcified andvalso warrants follow-up. 3. Persistent mediastinal and hilar adenopathy. This may bevlong-standing given the previous reports of adenopathy. At the [facility] next exam, consider contrast-enhanced exam for further evaluation of the lymph nodes. 4. Given patient's history of smoking, follow-up chest CT at 6-12vmonths is recommended. 5. Coronary artery disease. . 01-10-15: ct of chest; 1. No acute findings. 2.  Stable lung nodules as detailed above, the largest in the right upper lobe measuring 5 mm. Recommend additional unenhanced chest CT follow-up and 18 months per Fleischner criteria for following pulmonary nodules.  08-29-16: ct of abdomen and pelvis: 1. 4.4 x 5.7 x 7.5 cm pseudoaneurysm arising from the left aspect of a fem-fem bypass graft in the left inguinal region. Finding suggestive of graft failure. Superimposed infection not excluded. 2. Extensive atherosclerotic calcifications throughout the intra-abdominal aorta and its branch vessels. 3. 6 mm calculus at the right UVJ with secondary mild right hydroureteronephrosis. Additional nonobstructive right renal nephrolithiasis as above. 4. Large volume stool within the rectal vault, suggesting constipation.  08-31-16: ABI:  Unable to obtain right ABI due to non-detectable waveforms. Left ABI of 0.43 is suggestive of severe arterial occlusive disease at rest. Unable to obtain bilateral TBI&'s due to low amplitude waveforms. Other specific details can be found in the table(s) above.  TODAY;  09-07-16: ct angio of abdominal aorta with iliofemoral: VASCULAR 1. Poorly opacified distal segment of the right to left femoral- femoral bypass graft and poorly opacified new left femoral to popliteal bypass graft. Although some of this is felt to relate to contrast timing, there would be some concern of the potential for imminent thrombosis and vascular surgical assessment is recommended. The previously identified left groin pseudoaneurysm has been resected and repaired and there is no evidence of recurrent pseudoaneurysm or active contrast extravasation. Some postoperative changes are evident around the graft likely representing some hemorrhage that extends superiorly abutting the abdominal wall. 2. The native right common femoral artery does demonstrate significant disease and focal moderate stenosis just proximal to the bypass graft anastomosis which could account  for some potential inflow restriction into the femoral-femoral graft. 3. Poor opacification at the level of bilateral popliteal and tibial arteries on the CTA results and lack of accurate patency assessment of  these vessels. NON-VASCULAR 1. Stable nonobstructing right renal calculi and bladder calculus. 2. Fairly large amount of stool is present in the rectum.   09-07-16: chest x-ray: Small bilateral pleural effusions. Cardiomegaly without edema. Atherosclerosis. Fullness of the right hilum consistent with lymphadenopathy as seen on prior CT scan is unchanged.   09-08-16: TEE:   - Left ventricle: The cavity size was normal. Wall thickness was increased in a pattern of mild LVH. Systolic function was  severely reduced. The estimated ejection fraction was in the range of 25% to 30%. Diffuse hypokinesis. - Aortic valve: There was mild regurgitation. - Mitral valve: There was mild regurgitation. - Left atrium: The atrium was severely dilated. - Right atrium: The atrium was severely dilated. - Tricuspid valve: There was moderate regurgitation. - Pulmonary arteries: Systolic pressure was moderately increased. PA peak pressure: 53 mm Hg (S).    LABS REVIEWED: PREVIOUS     09-14-15: hgb a1c 5.6 09-20-15: glucose 85; bun 26.8; creat 1.04; k+ 4.5; na++144; mag 1.7; dig 0.68  11-10-15; wbc 6.7 hgb 12.2; hct 37.3; mcv 86.0; plt 209; gucose 83; bun 24.1; creat 1.24; k+ 5.5; na++ 144; (creat clear 31.49); liver normal albumin 3.8; chol 117; ldl 41; trig 131; hdl 50   Urine micro-albumin <1.2  02-03-16: wbc 6.5; hgb 11.8; hct 38.9;mcv 92.7; plt 171; glucose 101; bun 29.2; creat 1.14; k+ 4.5; na++ 147; ca 10.4; (creat clear 34.25); liver normal albumin 3.8  05-16-16: wbc 6.9; hgb 11.7; ht 38.3; mcv 87.8; plt 186; glucose 107; bun 32.2; creat 1.08; k+ 4.4; na++ 142; ca 9.8; hgb a1c 6.9  06-26-16: tsh 4.68; dig <0.40 08-29-16: wbc 8.3; hgb 13.0; hct 38.7; mcv 79.5; plt 179; glucose 132; bun 28; creat 1.20; k+ 4.2;  na++ 140; ca 9.8; liver normal albumin 3.6; urine culture; multiple bacteria   08-30-16: hgb a1c 6.6 09-01-16: wbc 12.0; hgb 8.7; hct 25.9; mcv 83.5; plt 115; glucose 134; bun 30; creat 1.44; k+ 4.0; na++ 138; ca 8.8   TODAY:   09-07-16; wbc 9.4; hgb 9.7; hct 29.8; mcv 85.1; plt 293; glucose 279; bun 13; creat 1.28; k+ 3.8; na++ 141; ca 9.2   1 of 2 blood culture: staph.  09-08-16: wbc 10.5; hgb 10;0; hct 31.2; mcv 84.8; plt 268; glucose 142; bun 14; creat 1.20; k+ 3.9; na++ 142; liver normal albumin 3.0 09-09-16: glucose 181; bun 23; creat 1.50; k+ 3.9; na++ 141; ca 9.2   Review of Systems  Unable to perform ROS: Dementia (cannot answer questions )     Physical Exam  Constitutional: No distress.  Frail   Eyes: Conjunctivae are normal.  Is bind   Neck: Neck supple. No JVD present. No thyromegaly present.  Cardiovascular: Normal rate and intact distal pulses.   Heart rate irregular   Respiratory: Effort normal and breath sounds normal. No respiratory distress. She has no wheezes.  GI: Soft. Bowel sounds are normal. She exhibits no distension. There is no tenderness.  Musculoskeletal: She exhibits no edema.  Left hemiparesis   Lymphadenopathy:    She has no cervical adenopathy.  Neurological: She is alert.  Skin: Skin is warm and dry. She is not diaphoretic.  Left lower abdomen and groin staples intact has wound vac in place  Right lower abdomen incision line All without signs of infection       Psychiatric: She has a normal mood and affect.    ASSESSMENT/ PLAN:  TODAY:   1. Gout no recent flares is stable  will  continue allopurinol 100 mg daily   2. Hypertension: is stable  b/p 124/70: will continue: lopressor 100 mg in the AM and 50 mg in the PM. Her norvasc was stopped in the hospital.   3. Afib: is stable is stable  heart rate controlled: will continue  Asa 81 mg daily; lopressor 100 mg in the AM and 50 mg in the PM will monitor   4. Dementia: is without change will  continue aricept 10 mg daily; will monitor her weight is 123 pounds will need to continue to monitor   5. Diabetes: is stable  hgb a1c is 6.6  (previous 6.9)   She is off lantus and  metformin   6. Chronic systolic heart failure:  Ef is  25-30% (09-08-16) (02-03-13 35-40%) has worsening  EF Is currently not on medications will not make changes will monitor  7. Dyslipidemia: stable ldl 41; trig 131: will continue lipitor 10 mg daily   8. Seizures: stable no reports of recent activity present; will continue keppra 250 mg twice daily  10. Depression: is stable will continue zoloft 100 mg daily. Does receive benefit from this medication   11. Pulmonary nodule: no change will continue ct scans every 18  months and will monitor her status.   Her ct scan was done Dec 2016  She declined ct scan this June.   12. CVA: is neurologically stable; has right hemiparesis; will continue asa 81 mg daily   13. Weight loss: her current weight is 123 pounds; will continue remeron 7.5 mg nightly  will continue supplements per facility protocol and will monitor  14. Stage III chronic kidney disease: no significant change: bun 23; creat 1.23  15.left femoral pseudoaneurysm: had a left fem to fem bypass pseudoaneurysm and  thrombectomy on 08-30-16. Is on doxycycline 100 mg twice daily for 7 days.    16. Acute blood loss anemia:is stable  is status post blood transfusion; hgb 8.7;will monitor     Ok Edwards NP [facility]  Contact [phone] Monday through Friday 8am- 5pm  After hours call [phone]

## 2016-09-10 NOTE — Telephone Encounter (Signed)
This is a patient of [facility], who was admitted to [facility] after hospitalization. [facility] F/U is needed. Hospital discharge from [facility] on 09/09/2016.

## 2016-09-11 LAB — BASIC METABOLIC PANEL
BUN: 16 (ref 4–21)
CREATININE: 1.2 — AB (ref 0.5–1.1)
Glucose: 213
POTASSIUM: 4.4 (ref 3.4–5.3)
Sodium: 141 (ref 137–147)

## 2016-09-12 ENCOUNTER — Encounter: Payer: Self-pay | Admitting: Surgery

## 2016-09-12 LAB — CULTURE, BLOOD (ROUTINE X 2)
Culture: NO GROWTH
Special Requests: ADEQUATE

## 2016-09-13 ENCOUNTER — Encounter: Payer: Self-pay | Admitting: Adult Health

## 2016-09-13 ENCOUNTER — Non-Acute Institutional Stay (SKILLED_NURSING_FACILITY): Payer: Medicare Other | Admitting: Internal Medicine

## 2016-09-13 DIAGNOSIS — E1140 Type 2 diabetes mellitus with diabetic neuropathy, unspecified: Secondary | ICD-10-CM

## 2016-09-13 DIAGNOSIS — N183 Chronic kidney disease, stage 3 unspecified: Secondary | ICD-10-CM

## 2016-09-13 DIAGNOSIS — I480 Paroxysmal atrial fibrillation: Secondary | ICD-10-CM | POA: Diagnosis not present

## 2016-09-13 DIAGNOSIS — F0150 Vascular dementia without behavioral disturbance: Secondary | ICD-10-CM | POA: Diagnosis not present

## 2016-09-13 DIAGNOSIS — I5022 Chronic systolic (congestive) heart failure: Secondary | ICD-10-CM

## 2016-09-13 DIAGNOSIS — I724 Aneurysm of artery of lower extremity: Secondary | ICD-10-CM

## 2016-09-13 DIAGNOSIS — D631 Anemia in chronic kidney disease: Secondary | ICD-10-CM

## 2016-09-13 DIAGNOSIS — E1122 Type 2 diabetes mellitus with diabetic chronic kidney disease: Secondary | ICD-10-CM

## 2016-09-13 DIAGNOSIS — R062 Wheezing: Secondary | ICD-10-CM | POA: Diagnosis not present

## 2016-09-13 DIAGNOSIS — I10 Essential (primary) hypertension: Secondary | ICD-10-CM

## 2016-09-13 NOTE — Progress Notes (Signed)
Patient ID: Casey [name], female   DOB: 1940-08-02, 76 y.o.   MRN: [id number]     HISTORY AND PHYSICAL   DATE:  September 13, 2016  Location:   [place] Room Number: 106 A Place of Service: SNF (31)   Extended Emergency Contact Information Primary Emergency Contact: Armstrong,Pat  [place] Phone: [phone] Relation: Daughter Secondary Emergency Contact: Alfonzo Beers, [place] Phone: [phone] Mobile Phone: [phone] Relation: Niece  Advanced Directive information Does Patient Have a Medical Advance Directive?: Yes, Would patient like information on creating a medical advance directive?: No - Patient declined, Type of Advance Directive: Out of facility DNR (pink MOST or yellow form), Pre-existing out of facility DNR order (yellow form or pink MOST form): Pink MOST form placed in chart (order not valid for inpatient use), Does patient want to make changes to medical advance directive?: No - Patient declined  Chief Complaint  Patient presents with  . Readmit To SNF    Readmission    HPI:    Past Medical History:  Diagnosis Date  . Abdominal or pelvic swelling, mass, or lump, left upper quadrant   . CHF (congestive heart failure) ([place])   . Chronic atrial fibrillation ([place])   . Chronic kidney disease   . Congestive heart failure, unspecified   . Coronary atherosclerosis of unspecified type of vessel, native or graft   . Diabetes mellitus   . Edema   . GERD (gastroesophageal reflux disease)   . Gout, unspecified   . Hypercholesterolemia   . Hypertension   . 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  . Peripheral vascular disease, unspecified ([place])   . Seizures ([place])   . Sinus of Valsalva aneurysm    a. By 2D echo 05/2011.  .  Stroke ([place])   . Unspecified hereditary and idiopathic peripheral neuropathy   . Unspecified urinary incontinence   . Vascular dementia, uncomplicated     Past Surgical History:  Procedure Laterality Date  . AORTOGRAM  09/01/2008   Abd w/ bilateral lower extremity runoff arteriography  . APPLICATION OF WOUND VAC Left 08/30/2016   Procedure: APPLICATION OF WOUND VAC LEFT GROIN;  Surgeon: Serafina Mitchell, MD;  Location: [place];  Service: Vascular;  Laterality: Left;  . ENDARTERECTOMY FEMORAL Left 08/30/2016   Procedure: LEFT COMMON FEMORAL ARTERY ENDARTERECTOMY;  Surgeon: Serafina Mitchell, MD;  Location: [place];  Service: Vascular;  Laterality: Left;  . FALSE ANEURYSM REPAIR  12/06/2011   Procedure: REPAIR FALSE ANEURYSM;  Surgeon: Angelia Mould, MD;  Location: [facility] OR;  Service: Vascular;  Laterality: Left;  Repair of left femoral Artery pseudoaneurysm  . FALSE ANEURYSM REPAIR Left 08/30/2016   Procedure: REPAIR PSEUDOANEURYSM LEFT GROIN;  Surgeon: Serafina Mitchell, MD;  Location: [place];  Service: Vascular;  Laterality: Left;  . FEMORAL ARTERY EXPLORATION  12/06/2011   Procedure: FEMORAL ARTERY EXPLORATION;  Surgeon: Angelia Mould, MD;  Location: [facility] OR;  Service: Vascular;  Laterality: N/A;  Exploration of large pseudoaneurysm left side of fem-fem bypass graft   . femoral to femoral bypass graft     . FEMORAL-FEMORAL BYPASS GRAFT  12/06/2011   Procedure: BYPASS GRAFT FEMORAL-FEMORAL ARTERY;  Surgeon: Angelia Mould, MD;  Location: [place];  Service: Vascular;  Laterality: Bilateral;  Revision of left to right Femoral-Femoral bypass graft  . FEMORAL-FEMORAL BYPASS GRAFT Left 08/30/2016   Procedure: REDO RIGHT TO LEFT FEMORAL-FEMORAL ARTERY BYPASS GRAFT;  Surgeon: Serafina Mitchell, MD;  Location: [place];  Service: Vascular;  Laterality: Left;  . FEMORAL-POPLITEAL BYPASS GRAFT Left 05/2002   lower extremity femoral to below knee w/ non-versed greater saphenous vein  . FEMORAL-POPLITEAL  BYPASS GRAFT Left 11/2002  . FEMORAL-POPLITEAL BYPASS GRAFT Left 08/30/2016   Procedure: REVISION OF LEFT FEMORAL-POPLITEAL ARTERY BYPASS GRAFT;  Surgeon: Serafina Mitchell, MD;  Location: [place];  Service: Vascular;  Laterality: Left;  . THROMBECTOMY FEMORAL ARTERY Left 08/30/2016   Procedure: THROMBECTOMY OF FEMORAL-FEMORAL ARTERY BYPASS GRAFT AND LEFT FEMORAL TO LEFT POPLITEAL BYPASS GRAFT;  Surgeon: Serafina Mitchell, MD;  Location: [place];  Service: Vascular;  Laterality: Left;  . TUBAL LIGATION Bilateral     Patient Care Team: Gildardo Cranker, DO as PCP - General (Internal Medicine) Nyoka Cowden Phylis Bougie, NP as Nurse Practitioner ([place]) [facility], [place] ([place])  Social History   Social History  . Marital status: Widowed    Spouse name: N/A  . Number of children: N/A  . Years of education: N/A   Occupational History  . Not on file.   Social History Main Topics  . Smoking status: Former Smoker    Types: Cigarettes    Quit date: 01/29/2001  . Smokeless tobacco: Never Used  . Alcohol use No  . Drug use: No  . Sexual activity: No   Other Topics Concern  . Not on file   Social History Narrative  . No narrative on file     reports that she quit smoking about 15 years ago. Her smoking use included Cigarettes. She has never used smokeless tobacco. She reports that she does not drink alcohol or use drugs.  Family History  Problem Relation Age of Onset  . Cancer Mother        Cervical  . Stroke Mother   . Diabetes Mother   . Stroke Father   . Heart disease Father    Family Status  Relation Status  . Mother Deceased at age 16       Cause of Death: Complications of diabetes  . Father Deceased       Cause of Death: Heart disease  . Brother Alive  . Daughter Alive  . Son Alive  . Sister Deceased  . Brother Deceased  . Brother Deceased    Immunization History  Administered Date(s) Administered  . Influenza Split 12/09/2011  .  Influenza-Unspecified 11/27/2013, 03/08/2015, 11/03/2015  . PPD Test 01/26/2014, 10/14/2015  . Pneumococcal Polysaccharide-23 02/05/2013    Allergies  Allergen Reactions  . Penicillins Other (See Comments)    Told by a doctor to "not take" and on MAR as an allergy Has patient had a PCN reaction causing immediate rash, facial/tongue/throat swelling, SOB or lightheadedness with hypotension: Unknown Has patient had a PCN reaction causing severe rash involving mucus membranes or skin necrosis: Unknown Has patient had a PCN reaction that required hospitalization: Unknown Has patient had a PCN reaction occurring within the last 10 years: Unknown If all of the above answers are "NO", then may proceed with Ceph    Medications: Patient's Medications  New Prescriptions   No medications on file  Previous Medications   ACETAMINOPHEN (TYLENOL) 325 MG TABLET    Take 2 tablets (650 mg total) by mouth every 6 (six) hours as needed  for mild pain (or Fever >/= 101).   ALLOPURINOL (ZYLOPRIM) 100 MG TABLET    Take 100 mg by mouth daily.   ASPIRIN 81 MG TABLET    Take 81 mg by mouth daily.   ATORVASTATIN (LIPITOR) 10 MG TABLET    Take 10 mg by mouth at bedtime.    BACITRACIN (BACIGUENT) 500 UNIT/GM OINTMENT    Apply to Right Hallux topically every day shift for 10 days for Right Ingrown Toenail   BISACODYL (BISCOLAX) 10 MG SUPPOSITORY    Place 10 mg rectally as needed for moderate constipation.   CETYLPYRIDINIUM CHLORIDE MT    Give 15 ml by mouth two times a day for oral irritation   DONEPEZIL (ARICEPT) 10 MG TABLET    Take 1 tablet (10 mg total) by mouth at bedtime.   DOXYCYCLINE (VIBRA-TABS) 100 MG TABLET    Take 1 tablet (100 mg total) by mouth every 12 (twelve) hours.   GUAIFENESIN-DEXTROMETHORPHAN (ROBITUSSIN DM) 100-10 MG/5ML SYRUP    Take 15 mLs by mouth every 4 (four) hours as needed for cough.   LEVETIRACETAM (KEPPRA) 250 MG TABLET    Take 250 mg by mouth 2 (two) times daily.   METOPROLOL  TARTRATE (LOPRESSOR) 25 MG TABLET    Give 2 tablets (72m) by mouth in the morning and 1 tablet by mouth at bedtime   MIRTAZAPINE (REMERON) 7.5 MG TABLET    Take 7.5 mg by mouth at bedtime.   OXYCODONE (OXY IR/ROXICODONE) 5 MG IMMEDIATE RELEASE TABLET    Take 1 tablet (5 mg total) by mouth every 6 (six) hours as needed for severe pain.   PANTOPRAZOLE (PROTONIX) 40 MG TABLET    Take 40 mg by mouth daily.   SERTRALINE (ZOLOFT) 50 MG TABLET    Take 50 mg by mouth at bedtime.   Modified Medications   No medications on file  Discontinued Medications   HYDRALAZINE (APRESOLINE) 25 MG TABLET    Take 25 mg by mouth every 6 (six) hours as needed.    Review of Systems  Vitals:   09/13/16 1046  BP: 128/60  Pulse: 80  Resp: 16  Temp: 97.6 F (36.4 C)  SpO2: 98%  Weight: 123 lb 8 oz (56 kg)  Height: 5' (1.524 m)   Body mass index is 24.12 kg/m.  Physical Exam   Labs reviewed: Abstract on 09/12/2016  Component Date Value Ref Range Status  . Glucose 09/11/2016 213   Final  . BUN 09/11/2016 16  4 - 21 Final  . Creatinine 09/11/2016 1.2* 0.5 - 1.1 Final  . Potassium 09/11/2016 4.4  3.4 - 5.3 Final  . Sodium 09/11/2016 141  137 - 147 Final  Admission on 09/07/2016, Discharged on 09/09/2016  Component Date Value Ref Range Status  . Sodium 09/07/2016 141  135 - 145 mmol/L Final  . Potassium 09/07/2016 3.8  3.5 - 5.1 mmol/L Final  . Chloride 09/07/2016 106  101 - 111 mmol/L Final  . CO2 09/07/2016 27  22 - 32 mmol/L Final  . Glucose, Bld 09/07/2016 279* 65 - 99 mg/dL Final  . BUN 09/07/2016 13  6 - 20 mg/dL Final  . Creatinine, Ser 09/07/2016 1.28* 0.44 - 1.00 mg/dL Final  . Calcium 09/07/2016 9.2  8.9 - 10.3 mg/dL Final  . GFR calc non Af Amer 09/07/2016 40* >60 mL/min Final  . GFR calc Af Amer 09/07/2016 46* >60 mL/min Final   Comment: (NOTE) The eGFR has been calculated using the CKD EPI  equation. This calculation has not been validated in all clinical situations. eGFR's  persistently <60 mL/min signify possible Chronic Kidney Disease.   . Anion gap 09/07/2016 8  5 - 15 Final  . WBC 09/07/2016 9.4  4.0 - 10.5 K/uL Final  . RBC 09/07/2016 3.50* 3.87 - 5.11 MIL/uL Final  . Hemoglobin 09/07/2016 9.7* 12.0 - 15.0 g/dL Final  . HCT 09/07/2016 29.8* 36.0 - 46.0 % Final  . MCV 09/07/2016 85.1  78.0 - 100.0 fL Final  . MCH 09/07/2016 27.7  26.0 - 34.0 pg Final  . MCHC 09/07/2016 32.6  30.0 - 36.0 g/dL Final  . RDW 09/07/2016 16.5* 11.5 - 15.5 % Final  . Platelets 09/07/2016 293  150 - 400 K/uL Final  . Neutrophils Relative % 09/07/2016 84  % Final  . Neutro Abs 09/07/2016 7.8* 1.7 - 7.7 K/uL Final  . Lymphocytes Relative 09/07/2016 10  % Final  . Lymphs Abs 09/07/2016 1.0  0.7 - 4.0 K/uL Final  . Monocytes Relative 09/07/2016 5  % Final  . Monocytes Absolute 09/07/2016 0.5  0.1 - 1.0 K/uL Final  . Eosinophils Relative 09/07/2016 1  % Final  . Eosinophils Absolute 09/07/2016 0.1  0.0 - 0.7 K/uL Final  . Basophils Relative 09/07/2016 0  % Final  . Basophils Absolute 09/07/2016 0.0  0.0 - 0.1 K/uL Final  . Troponin I 09/07/2016 0.03* <0.03 ng/mL Final   Comment: CRITICAL RESULT CALLED TO, READ BACK BY AND VERIFIED WITH: DOOLEY,H RN @ [id number] 09/07/16 LEONARD,A   . Prothrombin Time 09/07/2016 15.0  11.4 - 15.2 seconds Final  . INR 09/07/2016 1.17   Final  . Troponin I 09/07/2016 0.03* <0.03 ng/mL Final   CRITICAL VALUE NOTED.  VALUE IS CONSISTENT WITH PREVIOUSLY REPORTED AND CALLED VALUE.  Marland Kitchen Specimen Description 09/07/2016 BLOOD LEFT ANTECUBITAL   Final  . Special Requests 09/07/2016 BOTTLES DRAWN AEROBIC AND ANAEROBIC Blood Culture adequate volume   Final  . Culture  Setup Time 09/07/2016    Final                   Value:GRAM POSITIVE COCCI IN CLUSTERS IN BOTH AEROBIC AND ANAEROBIC BOTTLES CRITICAL RESULT CALLED TO, READ BACK BY AND VERIFIED WITH: H BAIRD 09/08/16 @ 1515 M VESTAL   . Culture 09/07/2016 *  Final                   Value:STAPHYLOCOCCUS SPECIES  (COAGULASE NEGATIVE) THE SIGNIFICANCE OF ISOLATING THIS ORGANISM FROM A SINGLE SET OF BLOOD CULTURES WHEN MULTIPLE SETS ARE DRAWN IS UNCERTAIN. PLEASE NOTIFY THE MICROBIOLOGY DEPARTMENT WITHIN ONE WEEK IF SPECIATION AND SENSITIVITIES ARE REQUIRED.   Marland Kitchen Report Status 09/07/2016 09/10/2016 FINAL   Final  . Specimen Description 09/07/2016 BLOOD RIGHT HAND   Final  . Special Requests 09/07/2016 IN PEDIATRIC BOTTLE Blood Culture adequate volume   Final  . Culture 09/07/2016 NO GROWTH 5 DAYS   Final  . Report Status 09/07/2016 09/12/2016 FINAL   Final  . Troponin I 09/07/2016 0.03* <0.03 ng/mL Final   CRITICAL VALUE NOTED.  VALUE IS CONSISTENT WITH PREVIOUSLY REPORTED AND CALLED VALUE.  . Troponin I 09/08/2016 0.03* <0.03 ng/mL Final   CRITICAL VALUE NOTED.  VALUE IS CONSISTENT WITH PREVIOUSLY REPORTED AND CALLED VALUE.  Marland Kitchen Weight 09/08/2016 2036.8  oz Final  . Height 09/08/2016 60  in Final  . BP 09/08/2016 146/88  mmHg Final  . WBC 09/08/2016 10.5  4.0 - 10.5 K/uL Final  . RBC  09/08/2016 3.68* 3.87 - 5.11 MIL/uL Final  . Hemoglobin 09/08/2016 10.0* 12.0 - 15.0 g/dL Final  . HCT 09/08/2016 31.2* 36.0 - 46.0 % Final  . MCV 09/08/2016 84.8  78.0 - 100.0 fL Final  . MCH 09/08/2016 27.2  26.0 - 34.0 pg Final  . MCHC 09/08/2016 32.1  30.0 - 36.0 g/dL Final  . RDW 09/08/2016 16.3* 11.5 - 15.5 % Final  . Platelets 09/08/2016 268  150 - 400 K/uL Final  . Sodium 09/08/2016 142  135 - 145 mmol/L Final  . Potassium 09/08/2016 4.2  3.5 - 5.1 mmol/L Final  . Chloride 09/08/2016 107  101 - 111 mmol/L Final  . CO2 09/08/2016 24  22 - 32 mmol/L Final  . Glucose, Bld 09/08/2016 142* 65 - 99 mg/dL Final  . BUN 09/08/2016 14  6 - 20 mg/dL Final  . Creatinine, Ser 09/08/2016 1.20* 0.44 - 1.00 mg/dL Final  . Calcium 09/08/2016 9.4  8.9 - 10.3 mg/dL Final  . Total Protein 09/08/2016 6.5  6.5 - 8.1 g/dL Final  . Albumin 09/08/2016 3.0* 3.5 - 5.0 g/dL Final  . AST 09/08/2016 16  15 - 41 U/L Final  . ALT  09/08/2016 9* 14 - 54 U/L Final  . Alkaline Phosphatase 09/08/2016 66  38 - 126 U/L Final  . Total Bilirubin 09/08/2016 0.9  0.3 - 1.2 mg/dL Final  . GFR calc non Af Amer 09/08/2016 43* >60 mL/min Final  . GFR calc Af Amer 09/08/2016 50* >60 mL/min Final   Comment: (NOTE) The eGFR has been calculated using the CKD EPI equation. This calculation has not been validated in all clinical situations. eGFR's persistently <60 mL/min signify possible Chronic Kidney Disease.   . Anion gap 09/08/2016 11  5 - 15 Final  . Troponin I 09/08/2016 0.03* <0.03 ng/mL Final   CRITICAL VALUE NOTED.  VALUE IS CONSISTENT WITH PREVIOUSLY REPORTED AND CALLED VALUE.  Marland Kitchen Glucose-Capillary 09/07/2016 138* 65 - 99 mg/dL Final  . Glucose-Capillary 09/08/2016 136* 65 - 99 mg/dL Final  . Glucose-Capillary 09/08/2016 232* 65 - 99 mg/dL Final  . Enterococcus species 09/07/2016 NOT DETECTED  NOT DETECTED Final  . Vancomycin resistance 09/07/2016 NOT DETECTED  NOT DETECTED Final  . Listeria monocytogenes 09/07/2016 NOT DETECTED  NOT DETECTED Final  . Staphylococcus species 09/07/2016 DETECTED* NOT DETECTED Final   Comment: CRITICAL RESULT CALLED TO, READ BACK BY AND VERIFIED WITH: H BAIRD 09/08/16 @ 1515 M VESTAL   . Staphylococcus aureus 09/07/2016 NOT DETECTED  NOT DETECTED Final  . Methicillin resistance 09/07/2016 NOT DETECTED  NOT DETECTED Final  . Streptococcus species 09/07/2016 NOT DETECTED  NOT DETECTED Final  . Streptococcus agalactiae 09/07/2016 NOT DETECTED  NOT DETECTED Final  . Streptococcus pneumoniae 09/07/2016 NOT DETECTED  NOT DETECTED Final  . Streptococcus pyogenes 09/07/2016 NOT DETECTED  NOT DETECTED Final  . Acinetobacter baumannii 09/07/2016 NOT DETECTED  NOT DETECTED Final  . Enterobacteriaceae species 09/07/2016 NOT DETECTED  NOT DETECTED Final  . Enterobacter cloacae complex 09/07/2016 NOT DETECTED  NOT DETECTED Final  . Escherichia coli 09/07/2016 NOT DETECTED  NOT DETECTED Final  .  Klebsiella oxytoca 09/07/2016 NOT DETECTED  NOT DETECTED Final  . Klebsiella pneumoniae 09/07/2016 NOT DETECTED  NOT DETECTED Final  . Proteus species 09/07/2016 NOT DETECTED  NOT DETECTED Final  . Serratia marcescens 09/07/2016 NOT DETECTED  NOT DETECTED Final  . Carbapenem resistance 09/07/2016 NOT DETECTED  NOT DETECTED Final  . Haemophilus influenzae 09/07/2016 NOT DETECTED  NOT DETECTED Final  .  Neisseria meningitidis 09/07/2016 NOT DETECTED  NOT DETECTED Final  . Pseudomonas aeruginosa 09/07/2016 NOT DETECTED  NOT DETECTED Final  . Candida albicans 09/07/2016 NOT DETECTED  NOT DETECTED Final  . Candida glabrata 09/07/2016 NOT DETECTED  NOT DETECTED Final  . Candida krusei 09/07/2016 NOT DETECTED  NOT DETECTED Final  . Candida parapsilosis 09/07/2016 NOT DETECTED  NOT DETECTED Final  . Candida tropicalis 09/07/2016 NOT DETECTED  NOT DETECTED Final  . Glucose-Capillary 09/08/2016 128* 65 - 99 mg/dL Final  . Sodium 09/09/2016 141  135 - 145 mmol/L Final  . Potassium 09/09/2016 3.9  3.5 - 5.1 mmol/L Final  . Chloride 09/09/2016 107  101 - 111 mmol/L Final  . CO2 09/09/2016 27  22 - 32 mmol/L Final  . Glucose, Bld 09/09/2016 181* 65 - 99 mg/dL Final  . BUN 09/09/2016 23* 6 - 20 mg/dL Final  . Creatinine, Ser 09/09/2016 1.50* 0.44 - 1.00 mg/dL Final  . Calcium 09/09/2016 9.2  8.9 - 10.3 mg/dL Final  . GFR calc non Af Amer 09/09/2016 33* >60 mL/min Final  . GFR calc Af Amer 09/09/2016 38* >60 mL/min Final   Comment: (NOTE) The eGFR has been calculated using the CKD EPI equation. This calculation has not been validated in all clinical situations. eGFR's persistently <60 mL/min signify possible Chronic Kidney Disease.   . Anion gap 09/09/2016 7  5 - 15 Final  . Glucose-Capillary 09/08/2016 154* 65 - 99 mg/dL Final  . Glucose-Capillary 09/09/2016 152* 65 - 99 mg/dL Final  . Glucose-Capillary 09/09/2016 164* 65 - 99 mg/dL Final  . Glucose-Capillary 09/09/2016 180* 65 - 99 mg/dL Final    Nursing Home on 09/05/2016  Component Date Value Ref Range Status  . Hemoglobin 09/04/2016 10.5* 12.0 - 16.0 Final  . HCT 09/04/2016 32* 36 - 46 Final  . Neutrophils Absolute 09/04/2016 8   Final  . Platelets 09/04/2016 220  150 - 399 Final  . WBC 09/04/2016 10.2   Final  . Glucose 09/04/2016 236   Final  . BUN 09/04/2016 14  4 - 21 Final  . Creatinine 09/04/2016 1.1  0.5 - 1.1 Final  . Potassium 09/04/2016 3.7  3.4 - 5.3 Final  . Sodium 09/04/2016 139  137 - 147 Final  . Alkaline Phosphatase 09/04/2016 76  25 - 125 Final  . ALT 09/04/2016 6* 7 - 35 Final  . AST 09/04/2016 9* 13 - 35 Final  . Bilirubin, Total 09/04/2016 0.6   Final  Nursing Home on 09/03/2016  Component Date Value Ref Range Status  . Glucose 08/07/2016 98   Final  . BUN 08/07/2016 28* 4 - 21 Final  . Creatinine 08/07/2016 1.4* 0.5 - 1.1 Final  . Potassium 08/07/2016 4.2  3.4 - 5.3 Final  . Sodium 08/07/2016 142  137 - 147 Final  . Triglycerides 08/07/2016 145  40 - 160 Final  . Cholesterol 08/07/2016 141  0 - 200 Final  . HDL 08/07/2016 53  35 - 70 Final  . LDL Cholesterol 08/07/2016 59   Final  . Alkaline Phosphatase 08/07/2016 94  25 - 125 Final  . ALT 08/07/2016 10  7 - 35 Final  . AST 08/07/2016 14  13 - 35 Final  . Bilirubin, Total 08/07/2016 0.3   Final  Admission on 08/29/2016, Discharged on 09/02/2016  Component Date Value Ref Range Status  . WBC 08/29/2016 8.3  4.0 - 10.5 K/uL Final  . RBC 08/29/2016 4.87  3.87 - 5.11 MIL/uL Final  .  Hemoglobin 08/29/2016 13.0  12.0 - 15.0 g/dL Final  . HCT 08/29/2016 38.7  36.0 - 46.0 % Final  . MCV 08/29/2016 79.5  78.0 - 100.0 fL Final  . MCH 08/29/2016 26.7  26.0 - 34.0 pg Final  . MCHC 08/29/2016 33.6  30.0 - 36.0 g/dL Final  . RDW 08/29/2016 15.8* 11.5 - 15.5 % Final  . Platelets 08/29/2016 179  150 - 400 K/uL Final  . Neutrophils Relative % 08/29/2016 66  % Final  . Neutro Abs 08/29/2016 5.6  1.7 - 7.7 K/uL Final  . Lymphocytes Relative 08/29/2016 20  %  Final  . Lymphs Abs 08/29/2016 1.7  0.7 - 4.0 K/uL Final  . Monocytes Relative 08/29/2016 10  % Final  . Monocytes Absolute 08/29/2016 0.8  0.1 - 1.0 K/uL Final  . Eosinophils Relative 08/29/2016 3  % Final  . Eosinophils Absolute 08/29/2016 0.2  0.0 - 0.7 K/uL Final  . Basophils Relative 08/29/2016 1  % Final  . Basophils Absolute 08/29/2016 0.0  0.0 - 0.1 K/uL Final  . Sodium 08/29/2016 140  135 - 145 mmol/L Final  . Potassium 08/29/2016 4.2  3.5 - 5.1 mmol/L Final  . Chloride 08/29/2016 105  101 - 111 mmol/L Final  . CO2 08/29/2016 26  22 - 32 mmol/L Final  . Glucose, Bld 08/29/2016 132* 65 - 99 mg/dL Final  . BUN 08/29/2016 28* 6 - 20 mg/dL Final  . Creatinine, Ser 08/29/2016 1.20* 0.44 - 1.00 mg/dL Final  . Calcium 08/29/2016 9.8  8.9 - 10.3 mg/dL Final  . Total Protein 08/29/2016 7.4  6.5 - 8.1 g/dL Final  . Albumin 08/29/2016 3.6  3.5 - 5.0 g/dL Final  . AST 08/29/2016 18  15 - 41 U/L Final  . ALT 08/29/2016 15  14 - 54 U/L Final  . Alkaline Phosphatase 08/29/2016 90  38 - 126 U/L Final  . Total Bilirubin 08/29/2016 0.7  0.3 - 1.2 mg/dL Final  . GFR calc non Af Amer 08/29/2016 43* >60 mL/min Final  . GFR calc Af Amer 08/29/2016 50* >60 mL/min Final   Comment: (NOTE) The eGFR has been calculated using the CKD EPI equation. This calculation has not been validated in all clinical situations. eGFR's persistently <60 mL/min signify possible Chronic Kidney Disease.   . Anion gap 08/29/2016 9  5 - 15 Final  . Color, Urine 08/29/2016 YELLOW  YELLOW Final  . APPearance 08/29/2016 CLEAR  CLEAR Final  . Specific Gravity, Urine 08/29/2016 1.012  1.005 - 1.030 Final  . pH 08/29/2016 7.0  5.0 - 8.0 Final  . Glucose, UA 08/29/2016 NEGATIVE  NEGATIVE mg/dL Final  . Hgb urine dipstick 08/29/2016 SMALL* NEGATIVE Final  . Bilirubin Urine 08/29/2016 NEGATIVE  NEGATIVE Final  . Ketones, ur 08/29/2016 NEGATIVE  NEGATIVE mg/dL Final  . Protein, ur 08/29/2016 100* NEGATIVE mg/dL Final  .  Nitrite 08/29/2016 NEGATIVE  NEGATIVE Final  . Leukocytes, UA 08/29/2016 TRACE* NEGATIVE Final  . RBC / HPF 08/29/2016 0-5  0 - 5 RBC/hpf Final  . WBC, UA 08/29/2016 0-5  0 - 5 WBC/hpf Final  . Bacteria, UA 08/29/2016 MANY* NONE SEEN Final  . Squamous Epithelial / LPF 08/29/2016 0-5* NONE SEEN Final  . Mucous 08/29/2016 PRESENT   Final  . Specimen Description 08/29/2016 URINE, CLEAN CATCH   Final  . Special Requests 08/29/2016 NONE   Final  . Culture 08/29/2016 MULTIPLE SPECIES PRESENT, SUGGEST RECOLLECTION*  Final  . Report Status 08/29/2016 08/31/2016 FINAL  Final  . Prothrombin Time 08/30/2016 13.3  11.4 - 15.2 seconds Final  . INR 08/30/2016 1.01   Final  . MRSA by PCR 08/30/2016 NEGATIVE  NEGATIVE Final   Comment:        The GeneXpert MRSA Assay (FDA approved for NASAL specimens only), is one component of a comprehensive MRSA colonization surveillance program. It is not intended to diagnose MRSA infection nor to guide or monitor treatment for MRSA infections.   . ABO/RH(D) 08/30/2016 B POS   Final  . Antibody Screen 08/30/2016 NEG   Final  . Sample Expiration 08/30/2016 09/02/2016   Final  . Unit Number 08/30/2016 [id number]   Final  . Blood Component Type 08/30/2016 RED CELLS,LR   Final  . Unit division 08/30/2016 00   Final  . Status of Unit 08/30/2016 ISSUED,FINAL   Final  . Transfusion Status 08/30/2016 OK TO TRANSFUSE   Final  . Crossmatch Result 08/30/2016 Compatible   Final  . Unit Number 08/30/2016 [id number]   Final  . Blood Component Type 08/30/2016 RBC LR PHER2   Final  . Unit division 08/30/2016 00   Final  . Status of Unit 08/30/2016 ISSUED,FINAL   Final  . Transfusion Status 08/30/2016 OK TO TRANSFUSE   Final  . Crossmatch Result 08/30/2016 Compatible   Final  . Specimen Description 08/30/2016 TISSUE LEFT FEMORAL ARTERY   Final  . Special Requests 08/30/2016 GRAFT TISSUE POF VANC   Final  . Gram Stain 08/30/2016    Final                    Value:RARE WBC PRESENT, PREDOMINANTLY PMN NO ORGANISMS SEEN   . Culture 08/30/2016 No growth aerobically or anaerobically.   Final  . Report Status 08/30/2016 09/05/2016 FINAL   Final  . Glucose-Capillary 08/30/2016 191* 65 - 99 mg/dL Final  . Sodium 08/31/2016 138  135 - 145 mmol/L Final  . Potassium 08/31/2016 4.6  3.5 - 5.1 mmol/L Final  . Chloride 08/31/2016 108  101 - 111 mmol/L Final  . CO2 08/31/2016 21* 22 - 32 mmol/L Final  . Glucose, Bld 08/31/2016 192* 65 - 99 mg/dL Final  . BUN 08/31/2016 27* 6 - 20 mg/dL Final  . Creatinine, Ser 08/31/2016 1.59* 0.44 - 1.00 mg/dL Final  . Calcium 08/31/2016 8.6* 8.9 - 10.3 mg/dL Final  . GFR calc non Af Amer 08/31/2016 30* >60 mL/min Final  . GFR calc Af Amer 08/31/2016 35* >60 mL/min Final   Comment: (NOTE) The eGFR has been calculated using the CKD EPI equation. This calculation has not been validated in all clinical situations. eGFR's persistently <60 mL/min signify possible Chronic Kidney Disease.   . Anion gap 08/31/2016 9  5 - 15 Final  . Hgb A1c MFr Bld 08/30/2016 6.6* 4.8 - 5.6 % Final   Comment: (NOTE)         Pre-diabetes: 5.7 - 6.4         Diabetes: >6.4         Glycemic control for adults with diabetes: <7.0   . Mean Plasma Glucose 08/30/2016 143  mg/dL Final   Comment: (NOTE) Performed At: [facility] [place], [place] [id number] Lindon Romp MD ZH:[id number]   . Glucose-Capillary 08/30/2016 223* 65 - 99 mg/dL Final  . Comment 1 08/30/2016 Capillary Specimen   Final  . Comment 2 08/30/2016 Notify RN   Final  . WBC 08/31/2016 12.2* 4.0 - 10.5 K/uL Final  . RBC  08/31/2016 2.78* 3.87 - 5.11 MIL/uL Final  . Hemoglobin 08/31/2016 7.3* 12.0 - 15.0 g/dL Final  . HCT 08/31/2016 22.3* 36.0 - 46.0 % Final  . MCV 08/31/2016 80.2  78.0 - 100.0 fL Final  . MCH 08/31/2016 26.3  26.0 - 34.0 pg Final  . MCHC 08/31/2016 32.7  30.0 - 36.0 g/dL Final  . RDW 08/31/2016 16.2* 11.5 - 15.5 % Final  .  Platelets 08/31/2016 137* 150 - 400 K/uL Final  . Glucose-Capillary 08/31/2016 200* 65 - 99 mg/dL Final  . Comment 1 08/31/2016 Notify RN   Final  . Order Confirmation 08/31/2016 ORDER PROCESSED BY BLOOD BANK   Final  . ISSUE DATE / TIME 08/30/2016 [id number]   Final  . Blood Product Unit Number 08/30/2016 [id number]   Final  . PRODUCT CODE 08/30/2016 [id number]   Final  . Unit Type and Rh 08/30/2016 7300   Final  . Blood Product Expiration Date 08/30/2016 [id number]   Final  . ISSUE DATE / TIME 08/30/2016 [id number]   Final  . Blood Product Unit Number 08/30/2016 [id number]   Final  . PRODUCT CODE 08/30/2016 [id number]   Final  . Unit Type and Rh 08/30/2016 7300   Final  . Blood Product Expiration Date 08/30/2016 [id number]   Final  . Glucose-Capillary 08/31/2016 132* 65 - 99 mg/dL Final  . Comment 1 08/31/2016 Notify RN   Final  . Glucose-Capillary 08/31/2016 61* 65 - 99 mg/dL Final  . Comment 1 08/31/2016 Notify RN   Final  . Sodium 09/01/2016 137  135 - 145 mmol/L Final  . Potassium 09/01/2016 4.0  3.5 - 5.1 mmol/L Final  . Chloride 09/01/2016 110  101 - 111 mmol/L Final  . CO2 09/01/2016 22  22 - 32 mmol/L Final  . Glucose, Bld 09/01/2016 134* 65 - 99 mg/dL Final  . BUN 09/01/2016 30* 6 - 20 mg/dL Final  . Creatinine, Ser 09/01/2016 1.44* 0.44 - 1.00 mg/dL Final  . Calcium 09/01/2016 8.8* 8.9 - 10.3 mg/dL Final  . GFR calc non Af Amer 09/01/2016 34* >60 mL/min Final  . GFR calc Af Amer 09/01/2016 40* >60 mL/min Final   Comment: (NOTE) The eGFR has been calculated using the CKD EPI equation. This calculation has not been validated in all clinical situations. eGFR's persistently <60 mL/min signify possible Chronic Kidney Disease.   . Anion gap 09/01/2016 5  5 - 15 Final  . WBC 09/01/2016 12.0* 4.0 - 10.5 K/uL Final  . RBC 09/01/2016 3.10* 3.87 - 5.11 MIL/uL Final  . Hemoglobin 09/01/2016 8.7* 12.0 - 15.0 g/dL Final  . HCT 09/01/2016 25.9* 36.0 - 46.0 % Final  . MCV  09/01/2016 83.5  78.0 - 100.0 fL Final  . MCH 09/01/2016 28.1  26.0 - 34.0 pg Final  . MCHC 09/01/2016 33.6  30.0 - 36.0 g/dL Final  . RDW 09/01/2016 16.3* 11.5 - 15.5 % Final  . Platelets 09/01/2016 115* 150 - 400 K/uL Final   PLATELET COUNT CONFIRMED BY SMEAR  . Hemoglobin 08/31/2016 9.6* 12.0 - 15.0 g/dL Final   POST TRANSFUSION SPECIMEN  . HCT 08/31/2016 29.4* 36.0 - 46.0 % Final  . Glucose-Capillary 08/31/2016 90  65 - 99 mg/dL Final  . Comment 1 08/31/2016 Notify RN   Final  . Glucose-Capillary 08/31/2016 232* 65 - 99 mg/dL Final  . Glucose-Capillary 09/01/2016 178* 65 - 99 mg/dL Final  . Glucose-Capillary 09/01/2016 134* 65 - 99 mg/dL Final  . Glucose-Capillary 09/01/2016 145* 65 - 99 mg/dL  Final  . Sodium 09/02/2016 135  135 - 145 mmol/L Final  . Potassium 09/02/2016 3.8  3.5 - 5.1 mmol/L Final  . Chloride 09/02/2016 106  101 - 111 mmol/L Final  . CO2 09/02/2016 23  22 - 32 mmol/L Final  . Glucose, Bld 09/02/2016 151* 65 - 99 mg/dL Final  . BUN 09/02/2016 27* 6 - 20 mg/dL Final  . Creatinine, Ser 09/02/2016 1.28* 0.44 - 1.00 mg/dL Final  . Calcium 09/02/2016 9.0  8.9 - 10.3 mg/dL Final  . GFR calc non Af Amer 09/02/2016 40* >60 mL/min Final  . GFR calc Af Amer 09/02/2016 46* >60 mL/min Final   Comment: (NOTE) The eGFR has been calculated using the CKD EPI equation. This calculation has not been validated in all clinical situations. eGFR's persistently <60 mL/min signify possible Chronic Kidney Disease.   . Anion gap 09/02/2016 6  5 - 15 Final  . Glucose-Capillary 09/01/2016 177* 65 - 99 mg/dL Final  . Glucose-Capillary 09/02/2016 159* 65 - 99 mg/dL Final  . Glucose-Capillary 09/02/2016 137* 65 - 99 mg/dL Final  . Glucose-Capillary 09/02/2016 128* 65 - 99 mg/dL Final  Nursing Home on 08/06/2016  Component Date Value Ref Range Status  . Glucose 02/03/2016 101   Final  . BUN 02/03/2016 29* 4 - 21 Final  . Creatinine 02/03/2016 1.1  0.5 - 1.1 Final  . Potassium 02/03/2016  4.5  3.4 - 5.3 Final  . Sodium 02/03/2016 142  137 - 147 Final  . Alkaline Phosphatase 02/03/2016 76  25 - 125 Final  . ALT 02/03/2016 20  7 - 35 Final  . AST 02/03/2016 27  13 - 35 Final  . Bilirubin, Total 02/03/2016 0.3   Final  . TSH 06/26/2016 4.68  0.41 - 5.90 Final    Dg Chest 2 View  Result Date: 09/07/2016 CLINICAL DATA:  Tachycardia today. EXAM: CHEST  2 VIEW COMPARISON:  CT chest 01/10/2015.  PA and lateral chest 12/06/2011. FINDINGS: There is cardiomegaly without edema. Fullness of the right hilum consistent with lymphadenopathy as seen on the prior CT scan is unchanged. The lungs are clear. There is no pneumothorax. Small bilateral pleural effusions are seen. Aortic atherosclerosis is noted. IMPRESSION: Small bilateral pleural effusions. Cardiomegaly without edema. Atherosclerosis. Fullness of the right hilum consistent with lymphadenopathy as seen on prior CT scan is unchanged. Electronically Signed   By: Inge Rise M.D.   On: 09/07/2016 12:17   Ct Abdomen Pelvis W Contrast  Result Date: 08/29/2016 CLINICAL DATA:  Initial evaluation for acute abdominal pain, left rolling mass. EXAM: CT ABDOMEN AND PELVIS WITH CONTRAST TECHNIQUE: Multidetector CT imaging of the abdomen and pelvis was performed using the standard protocol following bolus administration of intravenous contrast. CONTRAST:  134m ISOVUE-300 IOPAMIDOL (ISOVUE-300) INJECTION 61% COMPARISON:  Prior CT from 12/04/2011. FINDINGS: Lower chest: Scattered atelectatic changes noted within the visualized lung bases. Cardiomegaly partially visualized. No pleural or pericardial effusion. Hepatobiliary: Liver demonstrates a normal contrast enhanced appearance. Gallbladder within normal limits. No biliary dilatation. Pancreas: Pancreas somewhat atrophic but otherwise unremarkable without acute inflammation or mass lesion. Spleen: Subcentimeter hypodensity noted within central aspect of the spleen, of doubtful significance. Spleen  otherwise unremarkable. Adrenals/Urinary Tract: Adrenal glands within normal limits. Kidneys relatively equal in size with symmetric enhancement. Scattered cortical thinning and scarring. 5.6 cm cyst noted extending from the lower pole left kidney. Additional scattered subcentimeter hypodensities within the bilateral kidneys too small the characterize, but statistically likely reflects small cysts as well. Multiple  nonobstructive calculi present within the right kidney, largest of which positioned within the lower pole and measures approximately 15 mm. Mild right hydroureteronephrosis. There is an obstructive 6 mm calculus at the right UVJ (series 2, image 64). No left-sided renal calculi. No left-sided hydronephrosis or hydroureter. Bladder largely decompressed. Mild circumferential bladder wall thickening like related incomplete distension. Stomach/Bowel: Stomach within normal limits. No evidence for bowel obstruction. Appendix within normal limits. No acute inflammatory changes seen about the bowels. Large volume stool seen packed within the rectal vault, suggesting constipation. Vascular/Lymphatic: Extensive atherosclerosis throughout the intra-abdominal aorta and its branch vessels. Origin of the celiac axis, SMA, renal arteries, and IMA are grossly patent. Occlusion of the left common iliac just distal to its origin (series 2, image 42). Left internal iliac occluded proximally, with distal reconstitution likely via collateralization. Occluded vascular stent in place within the left external iliac artery. Extensive atherosclerotic disease throughout the right iliac system. Fem-fem bypass graft in place. That right common femoral anastomosis appears patent. Large pseudoaneurysm measuring 4.4 x 5.7 x 7.5 cm arises from the left common femoral anastomosis in the left inguinal region (series 2, image 63). Opacification of the left common femoral artery distally. Reproductive: Scattered calcified uterine fibroids  noted. Uterus and ovaries otherwise within normal limits. Other: No free air or fluid. Musculoskeletal: Small intramuscular lipoma noted within the left abdominus musculature. No acute osseous abnormality. No worrisome lytic or blastic osseous lesions. IMPRESSION: 1. 4.4 x 5.7 x 7.5 cm pseudoaneurysm arising from the left aspect of a fem-fem bypass graft in the left inguinal region. Finding suggestive of graft failure. Superimposed infection not excluded. 2. Extensive atherosclerotic calcifications throughout the intra-abdominal aorta and its branch vessels. 3. 6 mm calculus at the right UVJ with secondary mild right hydroureteronephrosis. Additional nonobstructive right renal nephrolithiasis as above. 4. Large volume stool within the rectal vault, suggesting constipation. Critical Value/emergent results were called by telephone at the time of interpretation on 08/29/2016 at 11:29 pm to Dr. Gareth Morgan , who verbally acknowledged these results. Electronically Signed   By: Jeannine Boga M.D.   On: 08/29/2016 23:34   Ct Angio Ao+bifem W & Or Wo Contrast  Result Date: 09/07/2016 CLINICAL DATA:  Status post repair of left femoral pseudoaneurysm with revision of right the left femoral- femoral bypass graft and left femoral-popliteal bypass graft. There has been some oozing of blood from the left groin operative site. EXAM: CT ANGIOGRAPHY OF ABDOMINAL AORTA WITH ILIOFEMORAL RUNOFF TECHNIQUE: Multidetector CT imaging of the abdomen, pelvis and lower extremities was performed using the standard protocol during bolus administration of intravenous contrast. Multiplanar CT image reconstructions and MIPs were obtained to evaluate the vascular anatomy. CONTRAST:  100 mL Isovue 370 IV COMPARISON:  CT of the abdomen and pelvis on 08/29/2016 FINDINGS: VASCULAR Aorta: The abdominal aorta and shows stable patency and atherosclerotic plaque. No evidence of aneurysmal disease or significant aortic stenosis. Celiac:  Calcified plaque causes approximately 40- 50% origin stenosis. SMA: Calcified plaque causes approximately 50% origin stenosis. Renals: Bilateral single renal arteries without significant stenosis. IMA: Origin patent. The proximal trunk shows heavily calcified plaque without occlusion. RIGHT Lower Extremity Inflow: Calcified common iliac artery without significant stenosis. Internal and external iliac arteries show diffuse disease. Maximal narrowing of the proximal external iliac artery approaches 50%. Outflow: Calcified plaque causes focal narrowing of the common femoral artery just prior to the femoral-femoral bypass graft. The artery is narrowed approximately 60- 70%. The femoral-femoral graft is patent but is poorly opacified  distally in the left groin. There is some thickening around the distal graft extending superiorly and abutting the abdominal wall. There likely is some postoperative hemorrhage in this region as well as a few foci of air. No findings to suggest focal abscess. 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 Profunda femoral artery is patent. At the level of the popliteal artery, there is poor arterial opacification and popliteal patency cannot be adequately assessed. Runoff: Patency of runoff below the right knee cannot be adequately assessed, due to poor opacification of distal vessels. 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 Internal and external iliac arteries are also chronically occluded. At the level of the left groin, the distal aspect of the femoral- femoral bypass graft is poorly opacified and there is evidence now of a new synthetic femoral-popliteal bypass graft which is poorly opacified. Outflow: Unable to assess distal bypass graft and native popliteal artery patency. Runoff: Unable to assess runoff artery patency. Review of the MIP images confirms the above findings. NON-VASCULAR Lower chest:  Bibasilar scarring and small bilateral pleural effusions, right greater than left. Hepatobiliary: No focal liver abnormality is seen. No gallstones, gallbladder wall thickening, or biliary dilatation. Pancreas: Unremarkable. No pancreatic ductal dilatation or surrounding inflammatory changes. Spleen: Normal in size without focal abnormality. Adrenals/Urinary Tract: Adrenal glands are unremarkable. Stable nonobstructing right renal calculi. Stable lower pole renal cyst on the left. Stable small right-sided bladder calculus. Stomach/Bowel: No evidence of bowel obstruction or ileus. There is a fairly large amount of fecal material located in the rectum. No free air. Lymphatic: No enlarged lymph nodes identified. Reproductive: Uterus demonstrates calcified degenerated fibroids. No adnexal masses. Other: No free fluid or hernias. Musculoskeletal: Degenerative disc disease at L5-S1. IMPRESSION: VASCULAR 1. Poorly opacified distal segment of the right to left femoral- femoral bypass graft and poorly opacified new left femoral to popliteal bypass graft. Although some of this is felt to relate to contrast timing, there would be some concern of the potential for imminent thrombosis and vascular surgical assessment is recommended. The previously identified left groin pseudoaneurysm has been resected and repaired and there is no evidence of recurrent pseudoaneurysm or active contrast extravasation. Some postoperative changes are evident around the graft likely representing some hemorrhage that extends superiorly abutting the abdominal wall. 2. The native right common femoral artery does demonstrate significant disease and focal moderate stenosis just proximal to the bypass graft anastomosis which could account for some potential inflow restriction into the femoral-femoral graft. 3. Poor opacification at the level of bilateral popliteal and tibial arteries on the CTA results and lack of accurate patency assessment of these  vessels. NON-VASCULAR 1. Stable nonobstructing right renal calculi and bladder calculus. 2. Fairly large amount of stool is present in the rectum. Electronically Signed   By: Aletta Edouard M.D.   On: 09/07/2016 16:06     Assessment/Plan    Monica S. Perlie Gold  [facility] [place], [place] [address] [phone] Cell (Monday-Friday 8 AM - 5 PM) [phone] After 5 PM and follow prompts  This encounter was created in error - please disregard.

## 2016-09-14 ENCOUNTER — Encounter (HOSPITAL_COMMUNITY): Payer: Self-pay

## 2016-09-14 ENCOUNTER — Inpatient Hospital Stay (HOSPITAL_COMMUNITY)
Admission: EM | Admit: 2016-09-14 | Discharge: 2016-10-05 | DRG: 207 | Disposition: A | Discharge status: Other Acute Inpatient Hospital | Payer: Medicare Other | Attending: Internal Medicine | Admitting: Internal Medicine

## 2016-09-14 ENCOUNTER — Inpatient Hospital Stay (HOSPITAL_COMMUNITY): Payer: Medicare Other

## 2016-09-14 ENCOUNTER — Emergency Department (HOSPITAL_COMMUNITY): Payer: Medicare Other

## 2016-09-14 DIAGNOSIS — E1151 Type 2 diabetes mellitus with diabetic peripheral angiopathy without gangrene: Secondary | ICD-10-CM | POA: Diagnosis present

## 2016-09-14 DIAGNOSIS — E43 Unspecified severe protein-calorie malnutrition: Secondary | ICD-10-CM | POA: Diagnosis present

## 2016-09-14 DIAGNOSIS — Z8049 Family history of malignant neoplasm of other genital organs: Secondary | ICD-10-CM

## 2016-09-14 DIAGNOSIS — R0602 Shortness of breath: Secondary | ICD-10-CM

## 2016-09-14 DIAGNOSIS — E1165 Type 2 diabetes mellitus with hyperglycemia: Secondary | ICD-10-CM | POA: Diagnosis present

## 2016-09-14 DIAGNOSIS — Y95 Nosocomial condition: Secondary | ICD-10-CM | POA: Diagnosis not present

## 2016-09-14 DIAGNOSIS — Z880 Allergy status to penicillin: Secondary | ICD-10-CM

## 2016-09-14 DIAGNOSIS — R64 Cachexia: Secondary | ICD-10-CM | POA: Diagnosis not present

## 2016-09-14 DIAGNOSIS — R1310 Dysphagia, unspecified: Secondary | ICD-10-CM

## 2016-09-14 DIAGNOSIS — M79602 Pain in left arm: Secondary | ICD-10-CM | POA: Diagnosis not present

## 2016-09-14 DIAGNOSIS — I959 Hypotension, unspecified: Secondary | ICD-10-CM | POA: Diagnosis present

## 2016-09-14 DIAGNOSIS — Z7189 Other specified counseling: Secondary | ICD-10-CM | POA: Diagnosis not present

## 2016-09-14 DIAGNOSIS — F329 Major depressive disorder, single episode, unspecified: Secondary | ICD-10-CM | POA: Diagnosis present

## 2016-09-14 DIAGNOSIS — D689 Coagulation defect, unspecified: Secondary | ICD-10-CM | POA: Diagnosis not present

## 2016-09-14 DIAGNOSIS — Z87891 Personal history of nicotine dependence: Secondary | ICD-10-CM

## 2016-09-14 DIAGNOSIS — K219 Gastro-esophageal reflux disease without esophagitis: Secondary | ICD-10-CM | POA: Diagnosis present

## 2016-09-14 DIAGNOSIS — R0600 Dyspnea, unspecified: Secondary | ICD-10-CM

## 2016-09-14 DIAGNOSIS — E785 Hyperlipidemia, unspecified: Secondary | ICD-10-CM | POA: Diagnosis present

## 2016-09-14 DIAGNOSIS — R001 Bradycardia, unspecified: Secondary | ICD-10-CM | POA: Diagnosis present

## 2016-09-14 DIAGNOSIS — Y92238 Other place in hospital as the place of occurrence of the external cause: Secondary | ICD-10-CM | POA: Diagnosis not present

## 2016-09-14 DIAGNOSIS — E1122 Type 2 diabetes mellitus with diabetic chronic kidney disease: Secondary | ICD-10-CM | POA: Diagnosis present

## 2016-09-14 DIAGNOSIS — Z515 Encounter for palliative care: Secondary | ICD-10-CM | POA: Diagnosis not present

## 2016-09-14 DIAGNOSIS — E860 Dehydration: Secondary | ICD-10-CM | POA: Diagnosis present

## 2016-09-14 DIAGNOSIS — D500 Iron deficiency anemia secondary to blood loss (chronic): Secondary | ICD-10-CM | POA: Diagnosis not present

## 2016-09-14 DIAGNOSIS — M109 Gout, unspecified: Secondary | ICD-10-CM | POA: Diagnosis not present

## 2016-09-14 DIAGNOSIS — J9690 Respiratory failure, unspecified, unspecified whether with hypoxia or hypercapnia: Secondary | ICD-10-CM

## 2016-09-14 DIAGNOSIS — J9600 Acute respiratory failure, unspecified whether with hypoxia or hypercapnia: Secondary | ICD-10-CM

## 2016-09-14 DIAGNOSIS — I69351 Hemiplegia and hemiparesis following cerebral infarction affecting right dominant side: Secondary | ICD-10-CM | POA: Diagnosis not present

## 2016-09-14 DIAGNOSIS — Z6821 Body mass index (BMI) 21.0-21.9, adult: Secondary | ICD-10-CM

## 2016-09-14 DIAGNOSIS — D649 Anemia, unspecified: Secondary | ICD-10-CM | POA: Diagnosis not present

## 2016-09-14 DIAGNOSIS — B3749 Other urogenital candidiasis: Secondary | ICD-10-CM | POA: Diagnosis not present

## 2016-09-14 DIAGNOSIS — I6930 Unspecified sequelae of cerebral infarction: Secondary | ICD-10-CM

## 2016-09-14 DIAGNOSIS — R1319 Other dysphagia: Secondary | ICD-10-CM

## 2016-09-14 DIAGNOSIS — T461X5A Adverse effect of calcium-channel blockers, initial encounter: Secondary | ICD-10-CM | POA: Diagnosis present

## 2016-09-14 DIAGNOSIS — E870 Hyperosmolality and hypernatremia: Secondary | ICD-10-CM | POA: Diagnosis not present

## 2016-09-14 DIAGNOSIS — I69398 Other sequelae of cerebral infarction: Secondary | ICD-10-CM

## 2016-09-14 DIAGNOSIS — Z0189 Encounter for other specified special examinations: Secondary | ICD-10-CM

## 2016-09-14 DIAGNOSIS — K921 Melena: Secondary | ICD-10-CM | POA: Diagnosis not present

## 2016-09-14 DIAGNOSIS — I472 Ventricular tachycardia: Secondary | ICD-10-CM | POA: Diagnosis not present

## 2016-09-14 DIAGNOSIS — Z86010 Personal history of colonic polyps: Secondary | ICD-10-CM

## 2016-09-14 DIAGNOSIS — I5023 Acute on chronic systolic (congestive) heart failure: Secondary | ICD-10-CM | POA: Diagnosis present

## 2016-09-14 DIAGNOSIS — N183 Chronic kidney disease, stage 3 unspecified: Secondary | ICD-10-CM | POA: Diagnosis present

## 2016-09-14 DIAGNOSIS — I4891 Unspecified atrial fibrillation: Secondary | ICD-10-CM

## 2016-09-14 DIAGNOSIS — R532 Functional quadriplegia: Secondary | ICD-10-CM | POA: Diagnosis not present

## 2016-09-14 DIAGNOSIS — Z8249 Family history of ischemic heart disease and other diseases of the circulatory system: Secondary | ICD-10-CM

## 2016-09-14 DIAGNOSIS — I071 Rheumatic tricuspid insufficiency: Secondary | ICD-10-CM | POA: Diagnosis present

## 2016-09-14 DIAGNOSIS — I420 Dilated cardiomyopathy: Secondary | ICD-10-CM | POA: Diagnosis present

## 2016-09-14 DIAGNOSIS — D62 Acute posthemorrhagic anemia: Secondary | ICD-10-CM | POA: Diagnosis not present

## 2016-09-14 DIAGNOSIS — I69311 Memory deficit following cerebral infarction: Secondary | ICD-10-CM

## 2016-09-14 DIAGNOSIS — J189 Pneumonia, unspecified organism: Principal | ICD-10-CM | POA: Diagnosis present

## 2016-09-14 DIAGNOSIS — K922 Gastrointestinal hemorrhage, unspecified: Secondary | ICD-10-CM | POA: Diagnosis not present

## 2016-09-14 DIAGNOSIS — N179 Acute kidney failure, unspecified: Secondary | ICD-10-CM | POA: Diagnosis not present

## 2016-09-14 DIAGNOSIS — D631 Anemia in chronic kidney disease: Secondary | ICD-10-CM | POA: Diagnosis present

## 2016-09-14 DIAGNOSIS — E876 Hypokalemia: Secondary | ICD-10-CM | POA: Diagnosis not present

## 2016-09-14 DIAGNOSIS — F0150 Vascular dementia without behavioral disturbance: Secondary | ICD-10-CM | POA: Diagnosis present

## 2016-09-14 DIAGNOSIS — Z4659 Encounter for fitting and adjustment of other gastrointestinal appliance and device: Secondary | ICD-10-CM

## 2016-09-14 DIAGNOSIS — R1314 Dysphagia, pharyngoesophageal phase: Secondary | ICD-10-CM | POA: Diagnosis present

## 2016-09-14 DIAGNOSIS — J159 Unspecified bacterial pneumonia: Secondary | ICD-10-CM | POA: Diagnosis present

## 2016-09-14 DIAGNOSIS — E46 Unspecified protein-calorie malnutrition: Secondary | ICD-10-CM

## 2016-09-14 DIAGNOSIS — D72829 Elevated white blood cell count, unspecified: Secondary | ICD-10-CM

## 2016-09-14 DIAGNOSIS — Z9851 Tubal ligation status: Secondary | ICD-10-CM

## 2016-09-14 DIAGNOSIS — M7989 Other specified soft tissue disorders: Secondary | ICD-10-CM | POA: Diagnosis not present

## 2016-09-14 DIAGNOSIS — I130 Hypertensive heart and chronic kidney disease with heart failure and stage 1 through stage 4 chronic kidney disease, or unspecified chronic kidney disease: Secondary | ICD-10-CM | POA: Diagnosis present

## 2016-09-14 DIAGNOSIS — Z7982 Long term (current) use of aspirin: Secondary | ICD-10-CM

## 2016-09-14 DIAGNOSIS — N170 Acute kidney failure with tubular necrosis: Secondary | ICD-10-CM | POA: Diagnosis not present

## 2016-09-14 DIAGNOSIS — F0390 Unspecified dementia without behavioral disturbance: Secondary | ICD-10-CM | POA: Diagnosis present

## 2016-09-14 DIAGNOSIS — I482 Chronic atrial fibrillation: Secondary | ICD-10-CM | POA: Diagnosis not present

## 2016-09-14 DIAGNOSIS — J810 Acute pulmonary edema: Secondary | ICD-10-CM | POA: Diagnosis not present

## 2016-09-14 DIAGNOSIS — Z8774 Personal history of (corrected) congenital malformations of heart and circulatory system: Secondary | ICD-10-CM

## 2016-09-14 DIAGNOSIS — T83511A Infection and inflammatory reaction due to indwelling urethral catheter, initial encounter: Secondary | ICD-10-CM | POA: Diagnosis not present

## 2016-09-14 DIAGNOSIS — G9341 Metabolic encephalopathy: Secondary | ICD-10-CM | POA: Diagnosis not present

## 2016-09-14 DIAGNOSIS — Z9181 History of falling: Secondary | ICD-10-CM

## 2016-09-14 DIAGNOSIS — E1121 Type 2 diabetes mellitus with diabetic nephropathy: Secondary | ICD-10-CM | POA: Diagnosis present

## 2016-09-14 DIAGNOSIS — J9601 Acute respiratory failure with hypoxia: Secondary | ICD-10-CM | POA: Diagnosis present

## 2016-09-14 DIAGNOSIS — Z7401 Bed confinement status: Secondary | ICD-10-CM

## 2016-09-14 DIAGNOSIS — I2510 Atherosclerotic heart disease of native coronary artery without angina pectoris: Secondary | ICD-10-CM | POA: Diagnosis present

## 2016-09-14 DIAGNOSIS — T501X5A Adverse effect of loop [high-ceiling] diuretics, initial encounter: Secondary | ICD-10-CM | POA: Diagnosis not present

## 2016-09-14 DIAGNOSIS — H548 Legal blindness, as defined in USA: Secondary | ICD-10-CM | POA: Diagnosis present

## 2016-09-14 DIAGNOSIS — G40909 Epilepsy, unspecified, not intractable, without status epilepticus: Secondary | ICD-10-CM | POA: Diagnosis present

## 2016-09-14 DIAGNOSIS — R627 Adult failure to thrive: Secondary | ICD-10-CM | POA: Diagnosis not present

## 2016-09-14 DIAGNOSIS — Z8572 Personal history of non-Hodgkin lymphomas: Secondary | ICD-10-CM

## 2016-09-14 DIAGNOSIS — Z823 Family history of stroke: Secondary | ICD-10-CM

## 2016-09-14 DIAGNOSIS — D696 Thrombocytopenia, unspecified: Secondary | ICD-10-CM | POA: Diagnosis not present

## 2016-09-14 DIAGNOSIS — Z833 Family history of diabetes mellitus: Secondary | ICD-10-CM

## 2016-09-14 LAB — TROPONIN I
TROPONIN I: 0.06 ng/mL — AB (ref <0.03)
TROPONIN I: 0.07 ng/mL — AB (ref <0.03)
Troponin I: 0.07 ng/mL (ref <0.03)

## 2016-09-14 LAB — BLOOD GAS, ARTERIAL
Acid-base deficit: 3 mmol/L — ABNORMAL HIGH (ref 0.0–2.0)
Bicarbonate: 21.6 mmol/L (ref 20.0–28.0)
Drawn by: 295031
FIO2: 100
MECHVT: 500 mL
O2 Saturation: 99.6 %
PEEP: 5 cmH2O
Patient temperature: 98.6
RATE: 16 resp/min
pCO2 arterial: 39.6 mmHg (ref 32.0–48.0)
pH, Arterial: 7.357 (ref 7.350–7.450)
pO2, Arterial: 357 mmHg — ABNORMAL HIGH (ref 83.0–108.0)

## 2016-09-14 LAB — COMPREHENSIVE METABOLIC PANEL
ALT: 12 U/L — AB (ref 14–54)
AST: 20 U/L (ref 15–41)
Albumin: 3.2 g/dL — ABNORMAL LOW (ref 3.5–5.0)
Alkaline Phosphatase: 82 U/L (ref 38–126)
Anion gap: 7 (ref 5–15)
BILIRUBIN TOTAL: 0.4 mg/dL (ref 0.3–1.2)
BUN: 24 mg/dL — AB (ref 6–20)
CHLORIDE: 109 mmol/L (ref 101–111)
CO2: 26 mmol/L (ref 22–32)
CREATININE: 1.31 mg/dL — AB (ref 0.44–1.00)
Calcium: 9.7 mg/dL (ref 8.9–10.3)
GFR, EST AFRICAN AMERICAN: 45 mL/min — AB (ref >60)
GFR, EST NON AFRICAN AMERICAN: 38 mL/min — AB (ref >60)
Glucose, Bld: 180 mg/dL — ABNORMAL HIGH (ref 65–99)
POTASSIUM: 4.6 mmol/L (ref 3.5–5.1)
Sodium: 142 mmol/L (ref 135–145)
TOTAL PROTEIN: 7.2 g/dL (ref 6.5–8.1)

## 2016-09-14 LAB — CBC
HEMATOCRIT: 29.1 % — AB (ref 36.0–46.0)
Hemoglobin: 9.4 g/dL — ABNORMAL LOW (ref 12.0–15.0)
MCH: 26.9 pg (ref 26.0–34.0)
MCHC: 32.3 g/dL (ref 30.0–36.0)
MCV: 83.4 fL (ref 78.0–100.0)
Platelets: 285 10*3/uL (ref 150–400)
RBC: 3.49 MIL/uL — ABNORMAL LOW (ref 3.87–5.11)
RDW: 15.9 % — AB (ref 11.5–15.5)
WBC: 10.4 10*3/uL (ref 4.0–10.5)

## 2016-09-14 LAB — CBC WITH DIFFERENTIAL/PLATELET
BASOS ABS: 0.1 10*3/uL (ref 0.0–0.1)
Basophils Relative: 1 %
EOS ABS: 0.2 10*3/uL (ref 0.0–0.7)
EOS PCT: 3 %
HCT: 39.9 % (ref 36.0–46.0)
HEMOGLOBIN: 13.1 g/dL (ref 12.0–15.0)
LYMPHS ABS: 1.2 10*3/uL (ref 0.7–4.0)
Lymphocytes Relative: 18 %
MCH: 27.6 pg (ref 26.0–34.0)
MCHC: 32.8 g/dL (ref 30.0–36.0)
MCV: 84 fL (ref 78.0–100.0)
Monocytes Absolute: 0.4 10*3/uL (ref 0.1–1.0)
Monocytes Relative: 5 %
NEUTROS PCT: 73 %
Neutro Abs: 5.2 10*3/uL (ref 1.7–7.7)
PLATELETS: 273 10*3/uL (ref 150–400)
RBC: 4.75 MIL/uL (ref 3.87–5.11)
RDW: 16 % — ABNORMAL HIGH (ref 11.5–15.5)
WBC: 7 10*3/uL (ref 4.0–10.5)

## 2016-09-14 LAB — PHOSPHORUS
PHOSPHORUS: 2.9 mg/dL (ref 2.5–4.6)
Phosphorus: 2.6 mg/dL (ref 2.5–4.6)

## 2016-09-14 LAB — I-STAT CHEM 8, ED
BUN: 24 mg/dL — AB (ref 6–20)
CHLORIDE: 105 mmol/L (ref 101–111)
Calcium, Ion: 1.29 mmol/L (ref 1.15–1.40)
Creatinine, Ser: 1.2 mg/dL — ABNORMAL HIGH (ref 0.44–1.00)
Glucose, Bld: 184 mg/dL — ABNORMAL HIGH (ref 65–99)
HEMATOCRIT: 23 % — AB (ref 36.0–46.0)
Hemoglobin: 7.8 g/dL — ABNORMAL LOW (ref 12.0–15.0)
POTASSIUM: 4.4 mmol/L (ref 3.5–5.1)
SODIUM: 142 mmol/L (ref 135–145)
TCO2: 26 mmol/L (ref 0–100)

## 2016-09-14 LAB — GLUCOSE, CAPILLARY
GLUCOSE-CAPILLARY: 141 mg/dL — AB (ref 65–99)
Glucose-Capillary: 138 mg/dL — ABNORMAL HIGH (ref 65–99)

## 2016-09-14 LAB — MAGNESIUM
Magnesium: 1.2 mg/dL — ABNORMAL LOW (ref 1.7–2.4)
Magnesium: 1.2 mg/dL — ABNORMAL LOW (ref 1.7–2.4)

## 2016-09-14 LAB — BRAIN NATRIURETIC PEPTIDE: B Natriuretic Peptide: 932.9 pg/mL — ABNORMAL HIGH (ref 0.0–100.0)

## 2016-09-14 LAB — I-STAT CG4 LACTIC ACID, ED
LACTIC ACID, VENOUS: 1.86 mmol/L (ref 0.5–1.9)
LACTIC ACID, VENOUS: 1.92 mmol/L — AB (ref 0.5–1.9)

## 2016-09-14 LAB — PROCALCITONIN: PROCALCITONIN: 0.1 ng/mL

## 2016-09-14 LAB — MRSA PCR SCREENING: MRSA by PCR: NEGATIVE

## 2016-09-14 MED ORDER — VITAL HIGH PROTEIN PO LIQD
1000.0000 mL | ORAL | Status: DC
  Administered 2016-09-15: 1000 mL
  Administered 2016-09-15: 20:00:00
  Administered 2016-09-16: 1000 mL
  Administered 2016-09-16 (×2)
  Administered 2016-09-16: 1000 mL
  Administered 2016-09-16: 04:00:00
  Administered 2016-09-17: 1000 mL
  Filled 2016-09-14 (×3): qty 1000

## 2016-09-14 MED ORDER — SERTRALINE HCL 50 MG PO TABS: 50.0000 mg | ORAL_TABLET | Freq: Every day | ORAL | Status: DC

## 2016-09-14 MED ORDER — ETOMIDATE 2 MG/ML IV SOLN
20.0000 mg | Freq: Once | INTRAVENOUS | Status: AC
  Administered 2016-09-14: 20 mg via INTRAVENOUS

## 2016-09-14 MED ORDER — DILTIAZEM LOAD VIA INFUSION
15.0000 mg | Freq: Once | INTRAVENOUS | Status: AC
  Administered 2016-09-14: 15 mg via INTRAVENOUS
  Filled 2016-09-14: qty 15

## 2016-09-14 MED ORDER — BISACODYL 10 MG RE SUPP: 10.0000 mg | RECTAL | Status: DC | PRN

## 2016-09-14 MED ORDER — BACID PO TABS: 2.0000 | ORAL_TABLET | Freq: Three times a day (TID) | ORAL | Status: DC

## 2016-09-14 MED ORDER — METOPROLOL TARTRATE 25 MG PO TABS: 50.0000 mg | ORAL_TABLET | Freq: Every morning | ORAL | Status: DC

## 2016-09-14 MED ORDER — IPRATROPIUM-ALBUTEROL 0.5-2.5 (3) MG/3ML IN SOLN: 3.0000 mL | RESPIRATORY_TRACT | Status: DC | PRN

## 2016-09-14 MED ORDER — PROPOFOL 1000 MG/100ML IV EMUL
INTRAVENOUS | Status: AC
  Administered 2016-09-14: 25 ug/kg/min via INTRAVENOUS
  Filled 2016-09-14: qty 100

## 2016-09-14 MED ORDER — VANCOMYCIN HCL IN DEXTROSE 1-5 GM/200ML-% IV SOLN
1000.0000 mg | Freq: Once | INTRAVENOUS | Status: AC
Start: 2016-09-14 — End: 2016-09-14
  Administered 2016-09-14: 1000 mg via INTRAVENOUS
  Filled 2016-09-14: qty 200

## 2016-09-14 MED ORDER — CHLORHEXIDINE GLUCONATE 0.12% ORAL RINSE (MEDLINE KIT)
15.0000 mL | Freq: Two times a day (BID) | OROMUCOSAL | Status: DC
  Administered 2016-09-14 – 2016-10-05 (×40): 15 mL via OROMUCOSAL
  Filled 2016-09-14 (×3): qty 15

## 2016-09-14 MED ORDER — DILTIAZEM HCL-DEXTROSE 100-5 MG/100ML-% IV SOLN (PREMIX)
5.0000 mg/h | INTRAVENOUS | Status: DC
  Administered 2016-09-14: 5 mg/h via INTRAVENOUS
  Filled 2016-09-14: qty 100

## 2016-09-14 MED ORDER — ALLOPURINOL 100 MG PO TABS
100.0000 mg | ORAL_TABLET | Freq: Every day | ORAL | Status: DC
  Administered 2016-09-14 – 2016-09-23 (×10): 100 mg
  Filled 2016-09-14 (×10): qty 1

## 2016-09-14 MED ORDER — LEVETIRACETAM 250 MG PO TABS
250.0000 mg | ORAL_TABLET | Freq: Two times a day (BID) | ORAL | Status: DC
  Filled 2016-09-14 (×2): qty 1

## 2016-09-14 MED ORDER — METOPROLOL TARTRATE 25 MG/10 ML ORAL SUSPENSION
25.0000 mg | Freq: Every day | ORAL | Status: DC
  Administered 2016-09-14 – 2016-09-15 (×2): 25 mg
  Filled 2016-09-14 (×2): qty 10

## 2016-09-14 MED ORDER — ATORVASTATIN CALCIUM 10 MG PO TABS
10.0000 mg | ORAL_TABLET | Freq: Every day | ORAL | Status: DC
  Administered 2016-09-14 – 2016-09-22 (×9): 10 mg
  Filled 2016-09-14 (×9): qty 1

## 2016-09-14 MED ORDER — PANTOPRAZOLE SODIUM 40 MG IV SOLR
40.0000 mg | Freq: Every day | INTRAVENOUS | Status: DC
  Administered 2016-09-14 – 2016-09-16 (×3): 40 mg via INTRAVENOUS
  Filled 2016-09-14 (×4): qty 40

## 2016-09-14 MED ORDER — DEXMEDETOMIDINE HCL IN NACL 200 MCG/50ML IV SOLN
0.0000 ug/kg/h | INTRAVENOUS | Status: DC
  Administered 2016-09-14: 0.6 ug/kg/h via INTRAVENOUS
  Administered 2016-09-14 (×2): 0.8 ug/kg/h via INTRAVENOUS
  Administered 2016-09-14 (×2): 0.6 ug/kg/h via INTRAVENOUS
  Administered 2016-09-14: 0.7 ug/kg/h via INTRAVENOUS
  Administered 2016-09-14: 0.4 ug/kg/h via INTRAVENOUS
  Administered 2016-09-15: 1 ug/kg/h via INTRAVENOUS
  Administered 2016-09-15: 0.7 ug/kg/h via INTRAVENOUS
  Administered 2016-09-15: 1 ug/kg/h via INTRAVENOUS
  Administered 2016-09-15: 0.8 ug/kg/h via INTRAVENOUS
  Administered 2016-09-15: 1 ug/kg/h via INTRAVENOUS
  Administered 2016-09-15 – 2016-09-16 (×3): 0.7 ug/kg/h via INTRAVENOUS
  Filled 2016-09-14 (×3): qty 50
  Filled 2016-09-14: qty 100
  Filled 2016-09-14: qty 50
  Filled 2016-09-14: qty 100
  Filled 2016-09-14 (×6): qty 50

## 2016-09-14 MED ORDER — FLORANEX PO PACK
1.0000 g | PACK | Freq: Three times a day (TID) | ORAL | Status: DC
  Administered 2016-09-14 – 2016-09-23 (×25): 1 g
  Filled 2016-09-14 (×37): qty 1

## 2016-09-14 MED ORDER — FENTANYL CITRATE (PF) 100 MCG/2ML IJ SOLN
50.0000 ug | INTRAMUSCULAR | Status: DC | PRN
  Administered 2016-09-15 (×4): 50 ug via INTRAVENOUS
  Filled 2016-09-14 (×5): qty 2

## 2016-09-14 MED ORDER — OXYCODONE HCL 5 MG PO TABS: 5.0000 mg | ORAL_TABLET | Freq: Four times a day (QID) | ORAL | Status: DC | PRN

## 2016-09-14 MED ORDER — VANCOMYCIN HCL IN DEXTROSE 1-5 GM/200ML-% IV SOLN: 1000.0000 mg | Freq: Once | INTRAVENOUS | Status: DC

## 2016-09-14 MED ORDER — VANCOMYCIN HCL IN DEXTROSE 1-5 GM/200ML-% IV SOLN
1000.0000 mg | INTRAVENOUS | Status: DC
  Administered 2016-09-15 – 2016-09-17 (×3): 1000 mg via INTRAVENOUS
  Filled 2016-09-14 (×3): qty 200

## 2016-09-14 MED ORDER — DONEPEZIL HCL 10 MG PO TABS
10.0000 mg | ORAL_TABLET | Freq: Every day | ORAL | Status: DC
  Administered 2016-09-14 – 2016-09-22 (×9): 10 mg
  Filled 2016-09-14 (×9): qty 1

## 2016-09-14 MED ORDER — ATORVASTATIN CALCIUM 10 MG PO TABS: 10.0000 mg | ORAL_TABLET | Freq: Every day | ORAL | Status: DC

## 2016-09-14 MED ORDER — MIDAZOLAM HCL 2 MG/2ML IJ SOLN
2.0000 mg | INTRAMUSCULAR | Status: DC | PRN
  Filled 2016-09-14: qty 2

## 2016-09-14 MED ORDER — PIPERACILLIN-TAZOBACTAM 3.375 G IVPB 30 MIN
3.3750 g | Freq: Once | INTRAVENOUS | Status: AC
  Administered 2016-09-14: 3.375 g via INTRAVENOUS
  Filled 2016-09-14: qty 50

## 2016-09-14 MED ORDER — HEPARIN SODIUM (PORCINE) 5000 UNIT/ML IJ SOLN
5000.0000 [IU] | Freq: Three times a day (TID) | INTRAMUSCULAR | Status: DC
  Administered 2016-09-14 – 2016-09-15 (×4): 5000 [IU] via SUBCUTANEOUS
  Filled 2016-09-14 (×4): qty 1

## 2016-09-14 MED ORDER — PRO-STAT SUGAR FREE PO LIQD: 30.0000 mL | Freq: Two times a day (BID) | ORAL | Status: DC

## 2016-09-14 MED ORDER — DONEPEZIL HCL 5 MG PO TABS: 10.0000 mg | ORAL_TABLET | Freq: Every day | ORAL | Status: DC

## 2016-09-14 MED ORDER — PROPOFOL 1000 MG/100ML IV EMUL
5.0000 ug/kg/min | Freq: Once | INTRAVENOUS | Status: AC
Start: 2016-09-14 — End: 2016-09-14
  Administered 2016-09-14: 5 ug/kg/min via INTRAVENOUS
  Administered 2016-09-14: 25 ug/kg/min via INTRAVENOUS

## 2016-09-14 MED ORDER — ASPIRIN 81 MG PO TABS: 81.0000 mg | ORAL_TABLET | Freq: Every day | ORAL | Status: DC

## 2016-09-14 MED ORDER — ALBUTEROL SULFATE (2.5 MG/3ML) 0.083% IN NEBU: 2.5000 mg | INHALATION_SOLUTION | RESPIRATORY_TRACT | Status: DC | PRN

## 2016-09-14 MED ORDER — METOPROLOL TARTRATE 25 MG/10 ML ORAL SUSPENSION: 25.0000 mg | Freq: Every day | ORAL | Status: DC

## 2016-09-14 MED ORDER — ENOXAPARIN SODIUM 30 MG/0.3ML ~~LOC~~ SOLN: 30.0000 mg | Freq: Every day | SUBCUTANEOUS | Status: DC

## 2016-09-14 MED ORDER — ORAL CARE MOUTH RINSE
15.0000 mL | Freq: Four times a day (QID) | OROMUCOSAL | Status: DC
  Administered 2016-09-14 – 2016-10-05 (×77): 15 mL via OROMUCOSAL

## 2016-09-14 MED ORDER — SERTRALINE HCL 50 MG PO TABS
50.0000 mg | ORAL_TABLET | Freq: Every day | ORAL | Status: DC
  Administered 2016-09-14 – 2016-09-22 (×9): 50 mg
  Filled 2016-09-14 (×9): qty 1

## 2016-09-14 MED ORDER — ALLOPURINOL 100 MG PO TABS: 100.0000 mg | ORAL_TABLET | Freq: Every day | ORAL | Status: DC

## 2016-09-14 MED ORDER — PIPERACILLIN-TAZOBACTAM 3.375 G IVPB 30 MIN: 3.3750 g | Freq: Four times a day (QID) | INTRAVENOUS | Status: DC

## 2016-09-14 MED ORDER — FLORANEX PO PACK
1.0000 g | PACK | Freq: Three times a day (TID) | ORAL | Status: DC
  Filled 2016-09-14: qty 1

## 2016-09-14 MED ORDER — DEXTROSE 5 % IV SOLN
2.0000 g | INTRAVENOUS | Status: AC
  Administered 2016-09-15 – 2016-09-20 (×6): 2 g via INTRAVENOUS
  Filled 2016-09-14 (×7): qty 2

## 2016-09-14 MED ORDER — LORAZEPAM 2 MG/ML IJ SOLN
1.0000 mg | Freq: Once | INTRAMUSCULAR | Status: AC
  Administered 2016-09-14: 1 mg via INTRAVENOUS
  Filled 2016-09-14: qty 1

## 2016-09-14 MED ORDER — GUAIFENESIN-DM 100-10 MG/5ML PO SYRP: 15.0000 mL | ORAL_SOLUTION | ORAL | Status: DC | PRN

## 2016-09-14 MED ORDER — VITAL HIGH PROTEIN PO LIQD
1000.0000 mL | ORAL | Status: DC
  Filled 2016-09-14: qty 1000

## 2016-09-14 MED ORDER — MORPHINE SULFATE (PF) 2 MG/ML IV SOLN: 4.0000 mg | Freq: Once | INTRAVENOUS | Status: DC

## 2016-09-14 MED ORDER — FUROSEMIDE 10 MG/ML IJ SOLN
40.0000 mg | Freq: Once | INTRAMUSCULAR | Status: AC
  Administered 2016-09-14: 40 mg via INTRAVENOUS

## 2016-09-14 MED ORDER — ACETAMINOPHEN 325 MG PO TABS: 650.0000 mg | ORAL_TABLET | Freq: Four times a day (QID) | ORAL | Status: DC | PRN

## 2016-09-14 MED ORDER — LEVETIRACETAM 100 MG/ML PO SOLN
250.0000 mg | Freq: Two times a day (BID) | ORAL | Status: DC
  Administered 2016-09-14 – 2016-09-23 (×18): 250 mg
  Filled 2016-09-14 (×23): qty 2.5

## 2016-09-14 MED ORDER — SODIUM CHLORIDE 0.9 % IV BOLUS (SEPSIS)
1000.0000 mL | Freq: Once | INTRAVENOUS | Status: AC
  Administered 2016-09-14: 1000 mL via INTRAVENOUS

## 2016-09-14 MED ORDER — FENTANYL CITRATE (PF) 100 MCG/2ML IJ SOLN
50.0000 ug | INTRAMUSCULAR | Status: DC | PRN
  Administered 2016-09-14 (×2): 50 ug via INTRAVENOUS
  Filled 2016-09-14 (×2): qty 2

## 2016-09-14 MED ORDER — BACID PO TABS
2.0000 | ORAL_TABLET | Freq: Three times a day (TID) | ORAL | Status: DC
  Filled 2016-09-14 (×2): qty 2

## 2016-09-14 MED ORDER — ASPIRIN 81 MG PO CHEW: 81.0000 mg | CHEWABLE_TABLET | Freq: Every day | ORAL | Status: DC

## 2016-09-14 MED ORDER — MIRTAZAPINE 7.5 MG PO TABS: 7.5000 mg | ORAL_TABLET | Freq: Every day | ORAL | Status: DC

## 2016-09-14 MED ORDER — BACITRACIN 500 UNIT/GM EX OINT
1.0000 "application " | TOPICAL_OINTMENT | Freq: Two times a day (BID) | CUTANEOUS | Status: DC
  Administered 2016-09-14 – 2016-10-05 (×43): 1 via TOPICAL
  Filled 2016-09-14 (×2): qty 28

## 2016-09-14 MED ORDER — ASPIRIN 81 MG PO CHEW
81.0000 mg | CHEWABLE_TABLET | Freq: Every day | ORAL | Status: DC
  Administered 2016-09-14 – 2016-09-23 (×10): 81 mg
  Filled 2016-09-14 (×10): qty 1

## 2016-09-14 MED ORDER — SUCCINYLCHOLINE CHLORIDE 20 MG/ML IJ SOLN
80.0000 mg | Freq: Once | INTRAMUSCULAR | Status: AC
  Administered 2016-09-14: 80 mg via INTRAVENOUS

## 2016-09-14 MED ORDER — SODIUM CHLORIDE 0.9 % IV BOLUS (SEPSIS)
250.0000 mL | Freq: Once | INTRAVENOUS | Status: AC
  Administered 2016-09-14: 250 mL via INTRAVENOUS

## 2016-09-14 NOTE — Care Management Note (Signed)
Case Management Note  Patient Details  Name: Casey [name] MRN: [id number] Date of Birth: 1940/12/07  Subjective/Objective:                  Sepsis and ventilator due to resp failure  Action/Plan: Will follow for dc needs Expected Discharge Date:   (UNKNOWN)               Expected Discharge Plan:     In-House Referral:     Discharge planning Services     Post Acute Care Choice:    Choice offered to:     DME Arranged:    DME Agency:     HH Arranged:    HH Agency:     Status of Service:     If discussed at [employer], dates discussed:    Additional Comments:  [name], RN 09/14/2016, 1:45 PM

## 2016-09-14 NOTE — Progress Notes (Signed)
Pharmacy Antibiotic Note  Casey [name] is a 76 y.o. female admitted on 09/14/2016 with pneumonia. She is also currently on Doxycycline for femoral wound which Vancomycin will cover.   Pharmacy has been consulted for Vancomycin dosing.   Zosyn started in ED, but PCN allergy.  Discussed with Marni Griffon, NP and will change to Cefepime (she has tolerated cephalosporins in the past).  09/14/2016:  Intubated  Afebrile  LA elevated & trending up 1.86-->1.92  Renal function at baseline- est CrCl ~ 7ml/min  Plan: Vancomycin 1g IV every 24 hours.  Goal trough 15-20 mcg/mL.  Cefepime 2gm IV q24h Monitor renal function and cx data   Height: 5\' 3"  (160 cm) Weight: 141 lb 5 oz (64.1 kg) IBW/kg (Calculated) : 52.4  Temp (24hrs), Avg:97.6 F (36.4 C), Min:97.6 F (36.4 C), Max:97.6 F (36.4 C)   Recent Labs Lab 09/07/16 1136 09/08/16 0242 09/09/16 0245 09/11/16 09/14/16 0535 09/14/16 0553 09/14/16 0633 09/14/16 0840  WBC 9.4 10.5  --   --  7.0  --   --   --   CREATININE 1.28* 1.20* 1.50* 1.2* 1.31* 1.20*  --   --   LATICACIDVEN  --   --   --   --   --   --  1.86 1.92*    Estimated Creatinine Clearance: 36 mL/min (A) (by C-G formula based on SCr of 1.2 mg/dL (H)).    Allergies  Allergen Reactions  . Penicillins Other (See Comments)    Told by a doctor to "not take" and on MAR as an allergy Has patient had a PCN reaction causing immediate rash, facial/tongue/throat swelling, SOB or lightheadedness with hypotension: Unknown Has patient had a PCN reaction causing severe rash involving mucus membranes or skin necrosis: Unknown Has patient had a PCN reaction that required hospitalization: Unknown Has patient had a PCN reaction occurring within the last 10 years: Unknown If all of the above answers are "NO", then may proceed with Ceph    Antimicrobials this admission: 8/17 Vanc >>  8/17 Zosyn x1 dose in ED 8/17 Cefepime>> 8/12 Doxy>>8/17 (prescribed x 7 days)  Dose  adjustments this admission:  Microbiology results: 8/17 BCx: sent 8/17 MRSA PCR: sent  Thank you for allowing pharmacy to be a part of this patient's care.  Biagio Borg 09/14/2016 10:37 AM

## 2016-09-14 NOTE — ED Notes (Signed)
I stat Lactic acid = 1.86

## 2016-09-14 NOTE — H&P (Signed)
History and Physical    Casey [name] KXF:[id number] DOB: Nov 03, 1940 DOA: 09/14/2016  PCP: Gildardo Cranker, DO  Patient coming from: nursing home  I have personally briefly reviewed patient's old medical records in [place]  Chief Complaint: fever and body aches  HPI: Casey [name] is a 76 y.o. female with medical history significant of  CVA,SEIZURES,Dementia,chronic foley catheter,recent repair of left femoral pseduo aneurysm complictaed with infection at the surgical site treated with doxycycline now admitted with subjective fever cough shortness of breath and generalized body aches.she is a poor historian.she denied nausea,vomitting,diarhhea.she does have a foley catheter in place for a long time.patient is usually bed bound with residual right sided weakness and foley catheter.  ED Course: she received a bag ivf,vanco and zosyn and nebulizer treatments.Chest xray showed patchy infiltrates in both lung fields consistent with pneumonia.  Review of Systems: As per HPI otherwise 10 point review of systems negative.   Past Medical History:  Diagnosis Date  . Abdominal or pelvic swelling, mass, or lump, left upper quadrant   . CHF (congestive heart failure) ([place])   . Chronic atrial fibrillation ([place])   . Chronic kidney disease   . Congestive heart failure, unspecified   . Coronary atherosclerosis of unspecified type of vessel, native or graft   . Diabetes mellitus   . Edema   . GERD (gastroesophageal reflux disease)   . Gout, unspecified   . Hypercholesterolemia   . Hypertension   . 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  . Peripheral vascular disease, unspecified ([place])   . Seizures ([place])   . Sinus of Valsalva aneurysm    a. By 2D echo 05/2011.  . Stroke ([place])   . Unspecified hereditary and idiopathic peripheral  neuropathy   . Unspecified urinary incontinence   . Vascular dementia, uncomplicated     Past Surgical History:  Procedure Laterality Date  . AORTOGRAM  09/01/2008   Abd w/ bilateral lower extremity runoff arteriography  . APPLICATION OF WOUND VAC Left 08/30/2016   Procedure: APPLICATION OF WOUND VAC LEFT GROIN;  Surgeon: Serafina Mitchell, MD;  Location: [place];  Service: Vascular;  Laterality: Left;  . ENDARTERECTOMY FEMORAL Left 08/30/2016   Procedure: LEFT COMMON FEMORAL ARTERY ENDARTERECTOMY;  Surgeon: Serafina Mitchell, MD;  Location: [place];  Service: Vascular;  Laterality: Left;  . FALSE ANEURYSM REPAIR  12/06/2011   Procedure: REPAIR FALSE ANEURYSM;  Surgeon: Angelia Mould, MD;  Location: [facility] OR;  Service: Vascular;  Laterality: Left;  Repair of left femoral Artery pseudoaneurysm  . FALSE ANEURYSM REPAIR Left 08/30/2016   Procedure: REPAIR PSEUDOANEURYSM LEFT GROIN;  Surgeon: Serafina Mitchell, MD;  Location: [place];  Service: Vascular;  Laterality: Left;  . FEMORAL ARTERY EXPLORATION  12/06/2011   Procedure: FEMORAL ARTERY EXPLORATION;  Surgeon: Angelia Mould, MD;  Location: [facility] OR;  Service: Vascular;  Laterality: N/A;  Exploration of large pseudoaneurysm left side of fem-fem bypass graft   . femoral to femoral bypass graft     . FEMORAL-FEMORAL BYPASS GRAFT  12/06/2011   Procedure: BYPASS GRAFT FEMORAL-FEMORAL ARTERY;  Surgeon: Angelia Mould, MD;  Location: [facility] OR;  Service: Vascular;  Laterality: Bilateral;  Revision of left to right Femoral-Femoral bypass graft  . FEMORAL-FEMORAL BYPASS GRAFT Left 08/30/2016   Procedure: REDO RIGHT TO LEFT FEMORAL-FEMORAL ARTERY BYPASS GRAFT;  Surgeon: Serafina Mitchell, MD;  Location: [facility] OR;  Service: Vascular;  Laterality: Left;  . FEMORAL-POPLITEAL BYPASS GRAFT Left 05/2002   lower extremity femoral to below knee w/ non-versed greater saphenous vein  . FEMORAL-POPLITEAL BYPASS GRAFT Left 11/2002  . FEMORAL-POPLITEAL BYPASS GRAFT Left  08/30/2016   Procedure: REVISION OF LEFT FEMORAL-POPLITEAL ARTERY BYPASS GRAFT;  Surgeon: Serafina Mitchell, MD;  Location: [place];  Service: Vascular;  Laterality: Left;  . THROMBECTOMY FEMORAL ARTERY Left 08/30/2016   Procedure: THROMBECTOMY OF FEMORAL-FEMORAL ARTERY BYPASS GRAFT AND LEFT FEMORAL TO LEFT POPLITEAL BYPASS GRAFT;  Surgeon: Serafina Mitchell, MD;  Location: [place];  Service: Vascular;  Laterality: Left;  . TUBAL LIGATION Bilateral      reports that she quit smoking about 15 years ago. Her smoking use included Cigarettes. She has never used smokeless tobacco. She reports that she does not drink alcohol or use drugs.  Allergies  Allergen Reactions  . Penicillins Other (See Comments)    Told by a doctor to "not take" and on MAR as an allergy Has patient had a PCN reaction causing immediate rash, facial/tongue/throat swelling, SOB or lightheadedness with hypotension: Unknown Has patient had a PCN reaction causing severe rash involving mucus membranes or skin necrosis: Unknown Has patient had a PCN reaction that required hospitalization: Unknown Has patient had a PCN reaction occurring within the last 10 years: Unknown If all of the above answers are "NO", then may proceed with Ceph    Family History  Problem Relation Age of Onset  . Cancer Mother        Cervical  . Stroke Mother   . Diabetes Mother   . Stroke Father   . Heart disease Father    Unacceptable: Noncontributory, unremarkable, or negative. Acceptable: Family history reviewed and not pertinent (If you reviewed it)  Prior to Admission medications   Medication Sig Start Date End Date Taking? Authorizing Provider  acetaminophen (TYLENOL) 325 MG tablet Take 2 tablets (650 mg total) by mouth every 6 (six) hours as needed for mild pain (or Fever >/= 101). 09/02/16  Yes Osei-Bonsu, Iona Beard, MD  allopurinol (ZYLOPRIM) 100 MG tablet Take 100 mg by mouth daily.   Yes [provider]  aspirin 81 MG tablet Take 81 mg by  mouth daily.   Yes [provider]  atorvastatin (LIPITOR) 10 MG tablet Take 10 mg by mouth at bedtime.    Yes [provider]  bacitracin (BACIGUENT) 500 UNIT/GM ointment Apply to Right Hallux topically every day shift for 10 days for Right Ingrown Toenail 09/12/16 09/21/16 Yes [provider]  bisacodyl (BISCOLAX) 10 MG suppository Place 10 mg rectally as needed for moderate constipation.   Yes [provider]  CETYLPYRIDINIUM CHLORIDE MT Give 15 ml by mouth two times a day for oral irritation   Yes [provider]  donepezil (ARICEPT) 10 MG tablet Take 1 tablet (10 mg total) by mouth at bedtime. 02/07/13  Yes Isaac Bliss, Rayford Halsted, MD  doxycycline (VIBRA-TABS) 100 MG tablet Take 1 tablet (100 mg total) by mouth every 12 (twelve) hours. 09/09/16 09/16/16 Yes Dhungel, Nishant, MD  guaiFENesin-dextromethorphan (ROBITUSSIN DM) 100-10 MG/5ML syrup Take 15 mLs by mouth every 4 (four) hours as needed for cough. 09/02/16  Yes Osei-Bonsu, Iona Beard, MD  levETIRAcetam (KEPPRA) 250 MG tablet Take 250 mg by mouth 2 (two) times daily.   Yes [provider]  metoprolol tartrate (LOPRESSOR) 25 MG tablet Give 2 tablets (50mg ) by mouth in the  morning and 1 tablet by mouth at bedtime   Yes [provider]  mirtazapine (REMERON) 7.5 MG tablet Take 7.5 mg by mouth at bedtime.   Yes [provider]  oxyCODONE (OXY IR/ROXICODONE) 5 MG immediate release tablet Take 1 tablet (5 mg total) by mouth every 6 (six) hours as needed for severe pain. 09/09/16  Yes Dhungel, Nishant, MD  pantoprazole (PROTONIX) 40 MG tablet Take 40 mg by mouth daily.   Yes [provider]  sertraline (ZOLOFT) 50 MG tablet Take 50 mg by mouth at bedtime.    Yes [provider]    Physical Exam: Vitals:   09/14/16 0627  BP: (!) 149/86  Pulse: (!) 111  Resp: (!) 26  SpO2: 95%    Constitutional: NAD, calm, comfortable Vitals:   09/14/16 0627  BP: (!) 149/86   Pulse: (!) 111  Resp: (!) 26  SpO2: 95%   Eyes: PERRL, lids and conjunctivae normal ENMT: Mucous membranes are moist. Posterior pharynx clear of any exudate or lesions.Normal dentition.  Neck: normal, supple, no masses, no thyromegaly Respiratory: clear to auscultation bilaterally, no wheezing, no crackles. Normal respiratory effort. No accessory muscle use.  Cardiovascular: Regular rate and rhythm, no murmurs / rubs / gallops. No extremity edema. 2+ pedal pulses. No carotid bruits.  Abdomen: no tenderness, no masses palpated. No hepatosplenomegaly. Bowel sounds positive.  Musculoskeletal: no clubbing / cyanosis. No joint deformity upper and lower extremities. Good ROM, no contractures. Normal muscle tone.  Skin: no rashes, lesions, ulcers. No induration Neurologic: CN 2-12 grossly intact. Sensation intact, DTR normal. Strength 5/5 in all 4.  Psychiatric: Normal judgment and insight. Alert and oriented x 3. Normal mood.   (Anything < 9 systems with 2 bullets each down codes to level 1) (If patient refuses exam can't bill higher level) (Make sure to document decubitus ulcers present on admission -- if possible -- and whether patient has chronic indwelling catheter at time of admission)  Labs on Admission: I have personally reviewed following labs and imaging studies  CBC:  Recent Labs Lab 09/07/16 1136 09/08/16 0242 09/14/16 0535 09/14/16 0553  WBC 9.4 10.5 7.0  --   NEUTROABS 7.8*  --  5.2  --   HGB 9.7* 10.0* 13.1 7.8*  HCT 29.8* 31.2* 39.9 23.0*  MCV 85.1 84.8 84.0  --   PLT 293 268 273  --    Basic Metabolic Panel:  Recent Labs Lab 09/07/16 1136 09/08/16 0242 09/09/16 0245 09/11/16 09/14/16 0535 09/14/16 0553  NA 141 142 141 141 142 142  K 3.8 4.2 3.9 4.4 4.6 4.4  CL 106 107 107  --  109 105  CO2 27 24 27   --  26  --   GLUCOSE 279* 142* 181*  --  180* 184*  BUN 13 14 23* 16 24* 24*  CREATININE 1.28* 1.20* 1.50* 1.2* 1.31* 1.20*  CALCIUM 9.2 9.4 9.2  --  9.7   --    GFR: Estimated Creatinine Clearance: 31.3 mL/min (A) (by C-G formula based on SCr of 1.2 mg/dL (H)). Liver Function Tests:  Recent Labs Lab 09/08/16 0242 09/14/16 0535  AST 16 20  ALT 9* 12*  ALKPHOS 66 82  BILITOT 0.9 0.4  PROT 6.5 7.2  ALBUMIN 3.0* 3.2*   No results for input(s): LIPASE, AMYLASE in the last 168 hours. No results for input(s): AMMONIA in the last 168 hours. Coagulation Profile:  Recent Labs Lab 09/07/16 1136  INR 1.17   Cardiac Enzymes:  Recent Labs Lab 09/07/16 1505 09/07/16 2016 09/08/16 0242 09/08/16 0729 09/14/16 0549  TROPONINI 0.03* 0.03* 0.03* 0.03* 0.06*   BNP (last 3 results) No results for input(s): PROBNP in the last 8760 hours. HbA1C: No results for input(s): HGBA1C in the last 72 hours. CBG:  Recent Labs Lab 09/08/16 1627 09/08/16 2158 09/09/16 0715 09/09/16 1122 09/09/16 1234  GLUCAP 128* 154* 152* 164* 180*   Lipid Profile: No results for input(s): CHOL, HDL, LDLCALC, TRIG, CHOLHDL, LDLDIRECT in the last 72 hours. Thyroid Function Tests: No results for input(s): TSH, T4TOTAL, FREET4, T3FREE, THYROIDAB in the last 72 hours. Anemia Panel: No results for input(s): VITAMINB12, FOLATE, FERRITIN, TIBC, IRON, RETICCTPCT in the last 72 hours. Urine analysis:    Component Value Date/Time   COLORURINE YELLOW 08/29/2016 1922   APPEARANCEUR CLEAR 08/29/2016 1922   LABSPEC 1.012 08/29/2016 1922   PHURINE 7.0 08/29/2016 1922   GLUCOSEU NEGATIVE 08/29/2016 1922   HGBUR SMALL (A) 08/29/2016 [place] NEGATIVE 08/29/2016 [place] NEGATIVE 08/29/2016 1922   PROTEINUR 100 (A) 08/29/2016 1922   UROBILINOGEN 0.2 02/02/2013 2008   NITRITE NEGATIVE 08/29/2016 1922   LEUKOCYTESUR TRACE (A) 08/29/2016 1922    Radiological Exams on Admission: Dg Chest 2 View  Result Date: 09/14/2016 CLINICAL DATA:  Generalized body aches, atrial fibrillation. Sepsis. History of lymphoma. EXAM: CHEST  2 VIEW COMPARISON:  Chest  radiograph September 07, 2016 FINDINGS: New patchy airspace opacities RIGHT lung and LEFT lung base. No pleural effusion. Stable cardiomegaly. Fullness of the RIGHT greater than LEFT hila is unchanged. Calcified aortic knob. Similar chronic interstitial changes. No pneumothorax. Soft tissue planes and included osseous structures are nonsuspicious. IMPRESSION: New bilateral patchy airspace opacities concerning for pneumonia. Followup PA and lateral chest X-ray is recommended in 3-4 weeks following trial of antibiotic therapy to ensure resolution and exclude underlying malignancy. Stable cardiomegaly and chronic interstitial changes. Stable fullness of the hila concerning for lymphadenopathy given patient's history of lymphoma. Electronically Signed   By: Elon Alas M.D.   On: 09/14/2016 04:49    EKG: Independently reviewed.  Assessment/Plan Active Problems:   PNA (pneumonia)   Health care associated pneumonia patient admitted with cough,shortness of breath,subjective fever.chest xray consistent with pneumonia.patient is a nursing home resident who has been in and out of hospital a lot.she was given vancomycin and zosyn in the er which will be continued.her wbc count is normal.she is tachycardic in the er.better after hydration.add probiotics and duoneb.  Elevated troponin 0.06 with no acute ekg changes.follow levels.may be related to renal insufficiency.  DM?patient carries a history of DM.but not on any meds.monitor.  CVA with residual right sided weakness continue aspirin.  SEIZURES secondary to CVA continue keppra.  GOUT stable on allopurinol.  CKD Stage 3 ..baseline creatinine 1.28 to 1.50.renal functions improved after iv hydration.monitor labs tomorrow.  HYPERLIPDEMIA continue lipitor.  HYPERTENSION Continue metoprolol.  DEMENTIA continue aricept.  LEFT ABDOMINAL WOUND WITH WOUND VAC Consult wound care.  Chronic Afib continue rate control with lopressor.not on any OAC at this  time.  Decreased appetite continue remeron.  Depression continue zoloft.    DVT prophylaxis: lovenox Code Status: prior Family Communication: Disposition Plan: snf Consults called: none Admission status:medical floor   Georgette Shell MD [facility] Pager [phone]   If 7PM-7AM, please contact night-coverage [email or web address] Password [facility]  09/14/2016, 7:46 AM

## 2016-09-14 NOTE — Progress Notes (Signed)
Initial Nutrition Assessment  DOCUMENTATION CODES:   Severe malnutrition in context of acute illness/injury  INTERVENTION:   If within [place]: Initiate Vital HP @ 20 ml/hr, advance by 10 ml every 6 hours to goal rate of 50 ml/hr (1200 kcal, 105 g protein, 1003 ml H2O).  Will continue to monitor plan  NUTRITION DIAGNOSIS:   Malnutrition (severe) related to acute illness, wound healing (abdominal wound) as evidenced by moderate depletion of body fat, severe depletion of muscle mass.  GOAL:   Patient will meet greater than or equal to 90% of their needs  MONITOR:   Vent status, Labs, Weight trends, TF tolerance, I & O's  REASON FOR ASSESSMENT:   Consult Enteral/tube feeding initiation and management  ASSESSMENT:   76 year old woman with dementia, bedbound for 3 years, recent admission to [facility] for infected groin wound with staph following pseudoaneurysm repair admitted 8/17 with fevers, shortness of breath and cough, chest x-ray showing multifocal bilateral patchy infiltrates and atrial fibrillation/RVR. She was hypoxic requiring 2 L. She was started on Cardizem drip and became hypotensive and hence this was stopped. Developed respiratory distress and was emergently intubated in the ED  Patient in room with RN at bedside. No family present. RN alerted RD that the patient has stated in previous goals of care discussions that she would like everything but a feeding tube. Clarification needed if tube feeding at all is appropriate given goals of care.  Family had reported pt has had weight loss over the past year.  Nutrition-Focused physical exam completed. Findings are moderate fat depletion, severe muscle depletion, and no edema.   Patient is currently intubated on ventilator support MV: 8.2 L/min Temp (24hrs), Avg:97.5 F (36.4 C), Min:97.5 F (36.4 C), Max:97.5 F (36.4 C)  Medications reviewed. Labs reviewed: Low Mg Phos WNL  Diet Order:  Diet NPO time specified  Skin:    (abdominal and groin incisions)  Last BM:  8/11  Height:   Ht Readings from Last 1 Encounters:  09/14/16 5\' 3"  (1.6 m)    Weight:   Wt Readings from Last 1 Encounters:  09/14/16 141 lb 5 oz (64.1 kg)    Ideal Body Weight:  52.3 kg  BMI:  Body mass index is 25.03 kg/m.  Estimated Nutritional Needs:   Kcal:  1182  Protein:  90-100g  Fluid:  1.4L/day  EDUCATION NEEDS:   No education needs identified at this time  Clayton Bibles, MS, RD, LDN Pager: [phone] After Hours Pager: [phone]

## 2016-09-14 NOTE — ED Notes (Signed)
Pt very difficult to convince to allow lab draw and IV start after first set of blood culture and labs obtained Casey [name] refused second set of blood cultures to be drawn.

## 2016-09-14 NOTE — ED Triage Notes (Signed)
Pt states that when she found out [place] died she felt worse

## 2016-09-14 NOTE — Consult Note (Signed)
PULMONARY / CRITICAL CARE MEDICINE   Name: Casey [name] MRN: [id number] DOB: 1940-07-20    ADMISSION DATE:  09/14/2016 CONSULTATION DATE:  8/17  REFERRING MD:  Rodena Piety   CHIEF COMPLAINT:  Acute respiratory failure   HISTORY OF PRESENT ILLNESS:   This is a 76 year old female who was just discharged from [facility] on 8/12 after being admitted for wound infection s/p left femoral pseudoaneurysm repair. Cultures grew: SA on reflexed ID panel. She was treated initially w/ IV abx, seen by vascular surg, wound vac placed and sent back to SNF on [facility]. The patient at baseline has dementia, is bed bound, and poor historian, her EF is 35-40%. Admitted from the SNF w/ report of subjective fever, shortness of breath, cough and generalized body aches.  ER course:  Afib on tele, not febrile, uncooperative, LA < 2, CXR w/ new patchy infiltrates, given IV ABX and IVFs. Was to be admitted to tele.  While waiting for tele bed-->developed AF w/ RVR HR 150-170s. RR elevated, sats 80% on 2 liters, was combative and could not be placed on BIPAP. Intubated by EDP. PCCM asked to assess.   PAST MEDICAL HISTORY :  She  has a past medical history of Abdominal or pelvic swelling, mass, or lump, left upper quadrant; CHF (congestive heart failure) ([place]); Chronic atrial fibrillation ([place]); Chronic kidney disease; Congestive heart failure, unspecified; Coronary atherosclerosis of unspecified type of vessel, native or graft; Diabetes mellitus; Edema; GERD (gastroesophageal reflux disease); Gout, unspecified; Hypercholesterolemia; Hypertension; Legally blind (03/09/2014); Lymphoma ([place]); Peripheral vascular disease, unspecified ([place]); Seizures ([place]); Sinus of Valsalva aneurysm; Stroke [facility]); Unspecified hereditary and idiopathic peripheral neuropathy; Unspecified urinary incontinence; and Vascular dementia, uncomplicated.  PAST SURGICAL HISTORY: She  has a past surgical history that includes femoral to femoral bypass graft ;  Femoral artery debridement (12/06/2011); Femoral-femoral Bypass Graft (12/06/2011); False aneurysm repair (12/06/2011); Femoral-popliteal Bypass Graft (Left, 05/2002); Femoral-popliteal Bypass Graft (Left, 11/2002); Tubal ligation (Bilateral); Aortogram (09/01/2008); False aneurysm repair (Left, 08/30/2016); Femoral-femoral Bypass Graft (Left, 08/30/2016); Femoral-popliteal Bypass Graft (Left, 08/30/2016); Thrombectomy femoral artery (Left, 08/30/2016); Endarterectomy femoral (Left, 08/29/173); and Application if wound vac (Left, 08/30/2016).  Allergies  Allergen Reactions  . Penicillins Other (See Comments)    Told by a doctor to "not take" and on MAR as an allergy Has patient had a PCN reaction causing immediate rash, facial/tongue/throat swelling, SOB or lightheadedness with hypotension: Unknown Has patient had a PCN reaction causing severe rash involving mucus membranes or skin necrosis: Unknown Has patient had a PCN reaction that required hospitalization: Unknown Has patient had a PCN reaction occurring within the last 10 years: Unknown If all of the above answers are "NO", then may proceed with Ceph    No current facility-administered medications on file prior to encounter.    Current Outpatient Prescriptions on File Prior to Encounter  Medication Sig  . acetaminophen (TYLENOL) 325 MG tablet Take 2 tablets (650 mg total) by mouth every 6 (six) hours as needed for mild pain (or Fever >/= 101).  Marland Kitchen allopurinol (ZYLOPRIM) 100 MG tablet Take 100 mg by mouth daily.  Marland Kitchen aspirin 81 MG tablet Take 81 mg by mouth daily.  Marland Kitchen atorvastatin (LIPITOR) 10 MG tablet Take 10 mg by mouth at bedtime.   . bacitracin (BACIGUENT) 500 UNIT/GM ointment Apply to Right Hallux topically every day shift for 10 days for Right Ingrown Toenail  . bisacodyl (BISCOLAX) 10 MG suppository Place 10 mg rectally as needed for moderate constipation.  . CETYLPYRIDINIUM CHLORIDE  MT Give 15 ml by mouth two times a day for oral irritation  .  donepezil (ARICEPT) 10 MG tablet Take 1 tablet (10 mg total) by mouth at bedtime.  Marland Kitchen doxycycline (VIBRA-TABS) 100 MG tablet Take 1 tablet (100 mg total) by mouth every 12 (twelve) hours.  Marland Kitchen guaiFENesin-dextromethorphan (ROBITUSSIN DM) 100-10 MG/5ML syrup Take 15 mLs by mouth every 4 (four) hours as needed for cough.  . levETIRAcetam (KEPPRA) 250 MG tablet Take 250 mg by mouth 2 (two) times daily.  . metoprolol tartrate (LOPRESSOR) 25 MG tablet Give 2 tablets (50mg ) by mouth in the morning and 1 tablet by mouth at bedtime  . mirtazapine (REMERON) 7.5 MG tablet Take 7.5 mg by mouth at bedtime.  Marland Kitchen oxyCODONE (OXY IR/ROXICODONE) 5 MG immediate release tablet Take 1 tablet (5 mg total) by mouth every 6 (six) hours as needed for severe pain.  . pantoprazole (PROTONIX) 40 MG tablet Take 40 mg by mouth daily.  . sertraline (ZOLOFT) 50 MG tablet Take 50 mg by mouth at bedtime.     FAMILY HISTORY:  Her indicated that her mother is deceased. She indicated that her father is deceased. She indicated that her sister is deceased. She indicated that only one of her three brothers is alive. She indicated that her daughter is alive. She indicated that her son is alive.    SOCIAL HISTORY: She  reports that she quit smoking about 15 years ago. Her smoking use included Cigarettes. She has never used smokeless tobacco. She reports that she does not drink alcohol or use drugs.  REVIEW OF SYSTEMS:   Not able Sedated on vent   SUBJECTIVE:  Sedated on vent   VITAL SIGNS: BP (!) 103/55   Pulse (!) 53   Resp 16   SpO2 100%   HEMODYNAMICS:    VENTILATOR SETTINGS: Vent Mode: PRVC FiO2 (%):  [50 %-100 %] 50 % Set Rate:  [16 bmp] 16 bmp Vt Set:  [500 mL] 500 mL PEEP:  [5 cmH20] 5 cmH20 Plateau Pressure:  [20 cmH20] 20 cmH20  INTAKE / OUTPUT: No intake/output data recorded.  PHYSICAL EXAMINATION: General appearance:  Frail 75 Year old  female,cachectic  Sedated on vent Eyes: anicteric sclerae, moist  conjunctivae; PERRL, EOMI bilaterally. Mouth:  membranes and no mucosal ulcerations; orally intubated Neck: Trache  midline; neck supple,Marked JVD CV: RRR, no MRGs  Lungs/chest: diffuse rales and rhonchi, prolonged expiratory wheeze Abdomen: Soft, non-tender; no masses Extremities: No peripheral edema or extremity, the left fem Wound vac sponge is in place  Skin: Normal temperature, turgor and texture; no rash Psych: sedated on vent   LABS:  BMET  Recent Labs Lab 09/08/16 0242 09/09/16 0245 09/11/16 09/14/16 0535 09/14/16 0553  NA 142 141 141 142 142  K 4.2 3.9 4.4 4.6 4.4  CL 107 107  --  109 105  CO2 24 27  --  26  --   BUN 14 23* 16 24* 24*  CREATININE 1.20* 1.50* 1.2* 1.31* 1.20*  GLUCOSE 142* 181*  --  180* 184*    Electrolytes  Recent Labs Lab 09/08/16 0242 09/09/16 0245 09/14/16 0535  CALCIUM 9.4 9.2 9.7    CBC  Recent Labs Lab 09/07/16 1136 09/08/16 0242 09/14/16 0535 09/14/16 0553  WBC 9.4 10.5 7.0  --   HGB 9.7* 10.0* 13.1 7.8*  HCT 29.8* 31.2* 39.9 23.0*  PLT 293 268 273  --     Coag's  Recent Labs Lab 09/07/16 1136  INR 1.17  Sepsis Markers  Recent Labs Lab 09/14/16 0633 09/14/16 0840  LATICACIDVEN 1.86 1.92*    ABG  Recent Labs Lab 09/14/16 0915  PHART 7.357  PCO2ART 39.6  PO2ART 357*    Liver Enzymes  Recent Labs Lab 09/08/16 0242 09/14/16 0535  AST 16 20  ALT 9* 12*  ALKPHOS 66 82  BILITOT 0.9 0.4  ALBUMIN 3.0* 3.2*    Cardiac Enzymes  Recent Labs Lab 09/08/16 0242 09/08/16 0729 09/14/16 0549  TROPONINI 0.03* 0.03* 0.06*    Glucose  Recent Labs Lab 09/08/16 1144 09/08/16 1627 09/08/16 2158 09/09/16 0715 09/09/16 1122 09/09/16 1234  GLUCAP 232* 128* 154* 152* 164* 180*    Imaging Dg Chest 2 View  Result Date: 09/14/2016 CLINICAL DATA:  Generalized body aches, atrial fibrillation. Sepsis. History of lymphoma. EXAM: CHEST  2 VIEW COMPARISON:  Chest radiograph September 07, 2016 FINDINGS:  New patchy airspace opacities RIGHT lung and LEFT lung base. No pleural effusion. Stable cardiomegaly. Fullness of the RIGHT greater than LEFT hila is unchanged. Calcified aortic knob. Similar chronic interstitial changes. No pneumothorax. Soft tissue planes and included osseous structures are nonsuspicious. IMPRESSION: New bilateral patchy airspace opacities concerning for pneumonia. Followup PA and lateral chest X-ray is recommended in 3-4 weeks following trial of antibiotic therapy to ensure resolution and exclude underlying malignancy. Stable cardiomegaly and chronic interstitial changes. Stable fullness of the hila concerning for lymphadenopathy given patient's history of lymphoma. Electronically Signed   By: Elon Alas M.D.   On: 09/14/2016 04:49   Dg Chest Portable 1 View  Result Date: 09/14/2016 CLINICAL DATA:  Intubated.  OG tube placement. EXAM: PORTABLE CHEST 1 VIEW COMPARISON:  09/14/2016 FINDINGS: Endotracheal tube is 4 cm above the carina. OG tube tip is in the midesophagus. Cardiomegaly. Patchy bilateral airspace opacities, right greater than left are similar to prior study. No effusions. IMPRESSION: Endotracheal tube 4 cm above the carina. OG tube tip in the midthoracic esophagus. Recommend advancing. Stable patchy bilateral airspace opacities, right greater than left. Electronically Signed   By: Rolm Baptise M.D.   On: 09/14/2016 09:19   Dg Abd Portable 1v  Result Date: 09/14/2016 CLINICAL DATA:  NG tube placement. EXAM: PORTABLE ABDOMEN - 1 VIEW COMPARISON:  CT scan 09/07/2016 FINDINGS: The NG tube tip is in the body region the stomach. The bowel gas pattern is unremarkable. IMPRESSION: NG tube tip is in the body region of the stomach. Electronically Signed   By: Marijo Sanes M.D.   On: 09/14/2016 09:18     STUDIES:    CULTURES: BCX 2 8/17>>>  ANTIBIOTICS: vanc 8/17>>> Zosyn 8/17>>8/17 (PCN allergy but has tolerated cephalosporins) cefepime 8/17>>>  SIGNIFICANT  EVENTS:   LINES/TUBES: oett 8/17>>>  DISCUSSION: Presents w/ general aches and pains. CXR w/ bilateral airspace disease and AF. Not clear if this is actually a HCAP vs pulmonary edema. A possible scenario would be HCAP-->AF w/ RVR-->leading to acute decompensated heart failure.  She is full code but her son describes a story of significant decline over the last year. I suspect that we can get her through this acute event but we should explore if this is something that we should do in the future. I generally do not recommend aggressive & invasive measures in patients w/ advanced dementia and failure to thrive.   ASSESSMENT / PLAN:  PULMONARY A: Acute Hypoxic Respiratory failure in setting of progressive and diffuse airspace disease.  Favor pulmonary edema but HCAP on ddx.  PCXR: personally reviewed: progressive R>L  patchy bilateral infiltrates.  P:   Full vent support PAD protocol goal RAS -1 Lasix for neg neg fluid volume goal  Repeat pcxr in am  See ID section  CARDIOVASCULAR A:  Acute systolic HF (EF 85-46%) w/ pulmonary edema AF w/ RVR w/ h/o CAF Severe PVD s/p recent repair of left femoral pseudoaneurysm  Now bradycardic  P:  Tele  Lasix KVO IVFs Holding CCB gtt d/t bradycardia  Cont asa if hgb stable   RENAL A:   Stage 3 CKD (baseline cr 1.28 to 1.5)  P:   KVO IVFs Lasix X 1 I&O Replace lytes as needed  GASTROINTESTINAL A:   Severe protein calorie malnutrition w/ cachexia  P:   Nutritional consult PPI for SUP Start tube feeds  HEMATOLOGIC A:   Anemia of chronic disease.  CBC shows acute hgb gtt. ? Lab error vs actual bleeding P:  Hold asa and heparin for now scds STAT CBC-->if at baseline resume asa and [place] heparin   INFECTIOUS A:   Possible HCAP Recent left femoral wound infection  P:   BCX2 Sputum culture  vanc started 8/17>> Cefepime 8/17>>>   ENDOCRINE A:   Diabetes w/ hyperglycemia  P:   ssi protocol  NEUROLOGIC A:   Severe  dementia  Bed bound since stroke 3 years ago w/right sided weakness  P:   RASS goal: -1 PAD protocol    FAMILY  - Updates: spoke to son Micheal over the phone  - Inter-disciplinary family meet or Palliative Care meeting due by:  8/24  My cct 40 min  Erick Colace ACNP-BC [place] Pager # [phone] OR # [phone] if no answer     Pulmonary and [place] Pager: [phone]  09/14/2016, 10:01 AM

## 2016-09-14 NOTE — ED Notes (Signed)
MD notified of HR.

## 2016-09-14 NOTE — Plan of Care (Signed)
Ms. Casey [name] is a 76  y.o. female w/ pmh  Dementia (poor historian), CVA , seizure do, DM2, CAD, CHF (EF 35-40% ), Mild MR, and recent repair of L femoral pseudoaneurysm with wound infection hospitalized from 8/10-8/12 who was to be on doxycycline until 8/19. Patient presents with generalized body aches and subjective fevers. CBC relatively within normal limits. Chest x-ray showing bilateral patchy infiltrates suggestive of pneumonia. Patient was placed on broad-spectrum antibiotics of vancomycin and Zosyn for possible healthcare associated pneumonia. Ordered lactic acid and troponin. Admitted as inpatient to a telemetry bed.

## 2016-09-14 NOTE — Progress Notes (Signed)
eLink Physician-Brief Progress Note Patient Name: Casey [name] DOB: [birth year] MRN: [id number]   Date of Service  09/14/2016  HPI/Events of Note    eICU Interventions  Keppra changed to liquid form so it can go per tube.      Intervention Category Evaluation Type: Other  BYRUM,ROBERT S. 09/14/2016, 10:58 PM

## 2016-09-14 NOTE — ED Provider Notes (Signed)
[facility] DEPT Provider Note   CSN: [id number] Arrival date & time: 09/14/16  0354     History   Chief Complaint Chief Complaint  Patient presents with  . Headache  . general pain    HPI Casey [name] is a 76 y.o. female.  76 yo F with a cc of generalized body aches.  Going on for past couple of days.  Worsened with the death of Arethra Franklin.  Patient having subjective fevers and chills, denies cough, congestions, abdominal pain, vomiting, diarrhea.    The history is provided by the patient.  Headache   This is a new problem. The current episode started 2 days ago. The problem occurs constantly. The problem has not changed since onset.The pain is at a severity of 6/10. The pain is severe. The pain does not radiate. Pertinent negatives include no fever, no palpitations, no shortness of breath, no nausea and no vomiting. She has tried nothing for the symptoms. The treatment provided no relief.  Illness  This is a new problem. The current episode started 2 days ago. The problem occurs constantly. The problem has not changed since onset.Pertinent negatives include no chest pain, no headaches and no shortness of breath. Nothing aggravates the symptoms. Nothing relieves the symptoms. She has tried nothing for the symptoms. The treatment provided no relief.    Past Medical History:  Diagnosis Date  . Abdominal or pelvic swelling, mass, or lump, left upper quadrant   . CHF (congestive heart failure) ([place])   . Chronic atrial fibrillation ([place])   . Chronic kidney disease   . Congestive heart failure, unspecified   . Coronary atherosclerosis of unspecified type of vessel, native or graft   . Diabetes mellitus   . Edema   . GERD (gastroesophageal reflux disease)   . Gout, unspecified   . Hypercholesterolemia   . Hypertension   . 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  . Peripheral vascular disease, unspecified ([place])   . Seizures ([place])   . Sinus of Valsalva aneurysm    a. By 2D echo 05/2011.  . Stroke ([place])   . Unspecified hereditary and idiopathic peripheral neuropathy   . Unspecified urinary incontinence   . Vascular dementia, uncomplicated     Patient Active Problem List   Diagnosis Date Noted  . PNA (pneumonia) 09/14/2016  . Hypotension 09/10/2016  . Chronic kidney disease (CKD), stage III (moderate) 09/10/2016  . Acute generalized abdominal pain 09/07/2016  . Acute chest pain 09/07/2016  . Atrial fibrillation with rapid ventricular response ([place]) 09/07/2016  . Wound discharge 09/07/2016  . Anemia 09/07/2016  . 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 Pseudoaneurysm of femoral artery ([place]) 08/30/2016  . Vascular dementia without behavioral disturbance 08/29/2016  . History of CVA with residual deficit 08/29/2016  . Type 2 diabetes mellitus with diabetic neuropathy, without long-term current use of insulin ([place]) 08/29/2016  . Depression 04/24/2016  . Loss of weight 04/24/2016  . Hypertensive heart disease with congestive heart failure ([place]) 12/30/2014  . Dyslipidemia associated with type 2 diabetes mellitus ([place]) 12/30/2014  . CKD stage 3 due to type 2 diabetes mellitus ([place]) 12/06/2014  . 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 Chronic systolic congestive heart failure ([place]) 02/09/2013  . Solitary pulmonary nodule 02/09/2013  . Seizures ([place])   . PVD (peripheral vascular disease) ([place]) 09/30/2012  .  Gout of multiple sites 05/06/2012  . Dementia without behavioral disturbance 05/06/2012  . Atherosclerosis of native artery of extremity with intermittent claudication ([place]) 02/20/2012  . AKI (acute kidney injury) ([place]) 06/18/2011  . Type 2 diabetes mellitus with diabetic neuropathy ([place]) 12/29/2010  . Atrial fibrillation ([place])   . Late effects of CVA (cerebrovascular accident)      Past Surgical History:  Procedure Laterality Date  . AORTOGRAM  09/01/2008   Abd w/ bilateral lower extremity runoff arteriography  . APPLICATION OF WOUND VAC Left 08/30/2016   Procedure: APPLICATION OF WOUND VAC LEFT GROIN;  Surgeon: Serafina Mitchell, MD;  Location: [facility];  Service: Vascular;  Laterality: Left;  . ENDARTERECTOMY FEMORAL Left 08/30/2016   Procedure: LEFT COMMON FEMORAL ARTERY ENDARTERECTOMY;  Surgeon: Serafina Mitchell, MD;  Location: [facility];  Service: Vascular;  Laterality: Left;  . FALSE ANEURYSM REPAIR  12/06/2011   Procedure: REPAIR FALSE ANEURYSM;  Surgeon: Angelia Mould, MD;  Location: [facility] OR;  Service: Vascular;  Laterality: Left;  Repair of left femoral Artery pseudoaneurysm  . FALSE ANEURYSM REPAIR Left 08/30/2016   Procedure: REPAIR PSEUDOANEURYSM LEFT GROIN;  Surgeon: Serafina Mitchell, MD;  Location: [facility];  Service: Vascular;  Laterality: Left;  . FEMORAL ARTERY EXPLORATION  12/06/2011   Procedure: FEMORAL ARTERY EXPLORATION;  Surgeon: Angelia Mould, MD;  Location: [facility] OR;  Service: Vascular;  Laterality: N/A;  Exploration of large pseudoaneurysm left side of fem-fem bypass graft   . femoral to femoral bypass graft     . FEMORAL-FEMORAL BYPASS GRAFT  12/06/2011   Procedure: BYPASS GRAFT FEMORAL-FEMORAL ARTERY;  Surgeon: Angelia Mould, MD;  Location[facility] OR;  Service: Vascular;  Laterality: Bilateral;  Revision of left to right Femoral-Femoral bypass graft  . FEMORAL-FEMORAL BYPASS GRAFT Left 08/30/2016   Procedure: REDO RIGHT TO LEFT FEMORAL-FEMORAL ARTERY BYPASS GRAFT;  Surgeon: Serafina Mitchell, MD;  Location: [facility];  Service: Vascular;  Laterality: Left;  . FEMORAL-POPLITEAL BYPASS GRAFT Left 05/2002   lower extremity femoral to below knee w/ non-versed greater saphenous vein  . FEMORAL-POPLITEAL BYPASS GRAFT Left 11/2002  . FEMORAL-POPLITEAL BYPASS GRAFT Left 08/30/2016   Procedure: REVISION OF LEFT FEMORAL-POPLITEAL ARTERY BYPASS GRAFT;  Surgeon:  Serafina Mitchell, MD;  Location: [facility];  Service: Vascular;  Laterality: Left;  . THROMBECTOMY FEMORAL ARTERY Left 08/30/2016   Procedure: THROMBECTOMY OF FEMORAL-FEMORAL ARTERY BYPASS GRAFT AND LEFT FEMORAL TO LEFT POPLITEAL BYPASS GRAFT;  Surgeon: Serafina Mitchell, MD;  Location: [facility];  Service: Vascular;  Laterality: Left;  . TUBAL LIGATION Bilateral     OB History    No data available       Home Medications    Prior to Admission medications   Medication Sig Start Date End Date Taking? Authorizing Provider  acetaminophen (TYLENOL) 325 MG tablet Take 2 tablets (650 mg total) by mouth every 6 (six) hours as needed for mild pain (or Fever >/= 101). 09/02/16  Yes Osei-Bonsu, Iona Beard, MD  allopurinol (ZYLOPRIM) 100 MG tablet Take 100 mg by mouth daily.   Yes [provider]  aspirin 81 MG tablet Take 81 mg by mouth daily.   Yes [provider]  atorvastatin (LIPITOR) 10 MG tablet Take 10 mg by mouth at bedtime.    Yes [provider]  bacitracin (BACIGUENT) 500 UNIT/GM ointment Apply to Right Hallux topically every day shift for 10 days for Right Ingrown Toenail 09/12/16 09/21/16 Yes [provider]  bisacodyl (BISCOLAX) 10  MG suppository Place 10 mg rectally as needed for moderate constipation.   Yes [provider]  CETYLPYRIDINIUM CHLORIDE MT Give 15 ml by mouth two times a day for oral irritation   Yes [provider]  donepezil (ARICEPT) 10 MG tablet Take 1 tablet (10 mg total) by mouth at bedtime. 02/07/13  Yes Isaac Bliss, Rayford Halsted, MD  doxycycline (VIBRA-TABS) 100 MG tablet Take 1 tablet (100 mg total) by mouth every 12 (twelve) hours. 09/09/16 09/16/16 Yes Dhungel, Nishant, MD  guaiFENesin-dextromethorphan (ROBITUSSIN DM) 100-10 MG/5ML syrup Take 15 mLs by mouth every 4 (four) hours as needed for cough. 09/02/16  Yes Osei-Bonsu, Iona Beard, MD  levETIRAcetam (KEPPRA) 250 MG tablet Take 250 mg by mouth 2 (two) times daily.   Yes [provider]  metoprolol tartrate (LOPRESSOR) 25 MG tablet Give 2 tablets (50mg ) by mouth in the morning and 1 tablet by mouth at bedtime   Yes [provider]  mirtazapine (REMERON) 7.5 MG tablet Take 7.5 mg by mouth at bedtime.   Yes [provider]  oxyCODONE (OXY IR/ROXICODONE) 5 MG immediate release tablet Take 1 tablet (5 mg total) by mouth every 6 (six) hours as needed for severe pain. 09/09/16  Yes Dhungel, Nishant, MD  pantoprazole (PROTONIX) 40 MG tablet Take 40 mg by mouth daily.   Yes [provider]  sertraline (ZOLOFT) 50 MG tablet Take 50 mg by mouth at bedtime.    Yes [provider]    Family History Family History  Problem Relation Age of Onset  . Cancer Mother        Cervical  . Stroke Mother   . Diabetes Mother   . Stroke Father   . Heart disease Father     Social History Social History  Substance Use Topics  . Smoking status: Former Smoker    Types: Cigarettes    Quit date: 01/29/2001  . Smokeless tobacco: Never Used  . Alcohol use No     Allergies   Penicillins   Review of Systems Review of Systems  Constitutional: Negative for chills and fever.  HENT: Negative for congestion and rhinorrhea.   Eyes: Negative for redness and visual disturbance.  Respiratory: Negative for shortness of breath and wheezing.   Cardiovascular: Negative for chest pain and palpitations.  Gastrointestinal: Negative for nausea and vomiting.  Genitourinary: Negative for dysuria and urgency.  Musculoskeletal: Positive for arthralgias and myalgias.  Skin: Negative for pallor and wound.  Neurological: Positive for weakness (generalized). Negative for dizziness and headaches.     Physical Exam Updated Vital Signs BP (!) 149/86 (BP Location: Right Arm)   Pulse (!) 111   Resp (!) 26   SpO2 95%   Physical Exam  Constitutional: She is oriented to person, place, and time. She appears cachectic. No distress.  Appears much older than stated  age  HENT:  Head: Normocephalic and atraumatic.  Eyes: Pupils are equal, round, and reactive to light. EOM are normal.  Neck: Normal range of motion. Neck supple.  Cardiovascular: Normal rate and regular rhythm.  Exam reveals no gallop and no friction rub.   No murmur heard. Pulmonary/Chest: Effort normal. She has no wheezes. She has no rales.  Diffuse rhonci  Abdominal: Soft. She exhibits distension (mild). She exhibits no mass. There is no tenderness. There is no guarding.  Musculoskeletal: She exhibits no edema or tenderness.  Neurological: She is alert and oriented to person, place, and time.  Skin: Skin is warm and dry.  She is not diaphoretic.  Feels warm to touch, wound vac to left groin, no noted erythema or pain  Psychiatric: She has a normal mood and affect. Her behavior is normal.  Nursing note and vitals reviewed.    ED Treatments / Results  Labs (all labs ordered are listed, but only abnormal results are displayed) Labs Reviewed  COMPREHENSIVE METABOLIC PANEL - Abnormal; Notable for the following:       Result Value   Glucose, Bld 180 (*)    BUN 24 (*)    Creatinine, Ser 1.31 (*)    Albumin 3.2 (*)    ALT 12 (*)    GFR calc non Af Amer 38 (*)    GFR calc Af Amer 45 (*)    All other components within normal limits  CBC WITH DIFFERENTIAL/PLATELET - Abnormal; Notable for the following:    RDW 16.0 (*)    All other components within normal limits  I-STAT CHEM 8, ED - Abnormal; Notable for the following:    BUN 24 (*)    Creatinine, Ser 1.20 (*)    Glucose, Bld 184 (*)    Hemoglobin 7.8 (*)    HCT 23.0 (*)    All other components within normal limits  CULTURE, BLOOD (ROUTINE X 2)  CULTURE, BLOOD (ROUTINE X 2)  URINE CULTURE  URINALYSIS, ROUTINE W REFLEX MICROSCOPIC  LACTIC ACID, PLASMA  LACTIC ACID, PLASMA  TROPONIN I  I-STAT CG4 LACTIC ACID, ED  I-STAT CG4 LACTIC ACID, ED    EKG  EKG Interpretation  Date/Time:  Friday September 14 2016 06:19:59  EDT Ventricular Rate:  122 PR Interval:    QRS Duration: 122 QT Interval:  351 QTC Calculation: 501 R Axis:   -42 Text Interpretation:  Atrial fibrillation Left bundle branch block Baseline wander in lead(s) V3 No significant change since last tracing Confirmed by Deno Etienne [phone]) on 09/14/2016 6:22:14 AM       Radiology Dg Chest 2 View  Result Date: 09/14/2016 CLINICAL DATA:  Generalized body aches, atrial fibrillation. Sepsis. History of lymphoma. EXAM: CHEST  2 VIEW COMPARISON:  Chest radiograph September 07, 2016 FINDINGS: New patchy airspace opacities RIGHT lung and LEFT lung base. No pleural effusion. Stable cardiomegaly. Fullness of the RIGHT greater than LEFT hila is unchanged. Calcified aortic knob. Similar chronic interstitial changes. No pneumothorax. Soft tissue planes and included osseous structures are nonsuspicious. IMPRESSION: New bilateral patchy airspace opacities concerning for pneumonia. Followup PA and lateral chest X-ray is recommended in 3-4 weeks following trial of antibiotic therapy to ensure resolution and exclude underlying malignancy. Stable cardiomegaly and chronic interstitial changes. Stable fullness of the hila concerning for lymphadenopathy given patient's history of lymphoma. Electronically Signed   By: Elon Alas M.D.   On: 09/14/2016 04:49    Procedures Procedures (including critical care time)  Medications Ordered in ED Medications  vancomycin (VANCOCIN) IVPB 1000 mg/200 mL premix (1,000 mg Intravenous New Bag/Given 09/14/16 0626)  piperacillin-tazobactam (ZOSYN) IVPB 3.375 g (3.375 g Intravenous New Bag/Given 09/14/16 0626)  ipratropium-albuterol (DUONEB) 0.5-2.5 (3) MG/3ML nebulizer solution 3 mL (not administered)  sodium chloride 0.9 % bolus 1,000 mL (1,000 mLs Intravenous New Bag/Given 09/14/16 0525)     Initial Impression / Assessment and Plan / ED Course  I have reviewed the triage vital signs and the nursing notes.  Pertinent labs &  imaging results that were available during my care of the patient were reviewed by me and considered in my medical decision making (see chart  for details).     76 yo F with a cc of generalized body aches.  Denies other symptoms.  Will obtain cxr, ua, labs.    The patient with concern for pna on cxr, with her already being on doxy, and in a nursing home feel like she likely needs broad spectrum abx.  Will discuss with the hospitalist.   The patients results and plan were reviewed and discussed.   Any x-rays performed were independently reviewed by myself.   Differential diagnosis were considered with the presenting HPI.  Medications  vancomycin (VANCOCIN) IVPB 1000 mg/200 mL premix (1,000 mg Intravenous New Bag/Given 09/14/16 0626)  piperacillin-tazobactam (ZOSYN) IVPB 3.375 g (3.375 g Intravenous New Bag/Given 09/14/16 0626)  ipratropium-albuterol (DUONEB) 0.5-2.5 (3) MG/3ML nebulizer solution 3 mL (not administered)  sodium chloride 0.9 % bolus 1,000 mL (1,000 mLs Intravenous New Bag/Given 09/14/16 0525)    Vitals:   09/14/16 0627  BP: (!) 149/86  Pulse: (!) 111  Resp: (!) 26  SpO2: 95%    Final diagnoses:  HCAP (healthcare-associated pneumonia)    Admission/ observation were discussed with the admitting physician, patient and/or family and they are comfortable with the plan.    Final Clinical Impressions(s) / ED Diagnoses   Final diagnoses:  HCAP (healthcare-associated pneumonia)    New Prescriptions New Prescriptions   No medications on file     Deno Etienne, DO 09/14/16 1030

## 2016-09-14 NOTE — Consult Note (Signed)
[facility] Nurse wound consult note Reason for Consult:surgical incision left groin Wound type:surgical incision Pressure Injury POA: NA Measurement: 13cm surgical incision with staples, edges approximated Wound bed: na Drainage (amount, consistency, odor) none Periwound: intact Dressing procedure/placement/frequency: No dressing ordered. Patient had a wound vac on when arrived from [place].  Removed foam to find dry old incision with staples intact. No orders written. Will leave wound open to air. We will not follow, but will remain available to this patient, to nursing, and the medical and/or surgical teams.  Please re-consult if we need to assist further.    Fara Olden, RN-C, WTA-C, [facility] Wound Treatment Associate

## 2016-09-14 NOTE — ED Triage Notes (Signed)
EMS called to [facility] in response to pt c/o generalized malaise and bodyaches with headache. States she feels bad with generalized bodyaches  VS 155/83 P= 98  BPM Afib on monitor and Hx of same R= 18. CBG 172

## 2016-09-14 NOTE — ED Notes (Signed)
Pt from [facility], she complains of a headache and feeling bad all over Pt has dementia

## 2016-09-14 NOTE — ED Provider Notes (Signed)
Pt seen on previous shift and admitted to hospitalist service for pneumonia. As oncoming ED provider, I was asked by nursing to evaluate clinical change.    On my assessment she was crying out that she couldn't breathe. In afib with RVR with rate 150-170s. BP 150/80s. Very tachypneic. o2 sats in 80s on 2L [place]. Coarse breath sounds with wheezing b/l. Pt placed on NRB and moved to resus bay. Initially was going to place her on BiPAP but she was becoming combative and pulling at NRB mask. Couldn't redirect her. "Full Code" per most recent discharge summary and MOST form stating the same. Intubated for airway protection.   Not sure if acute change from worsening pulmonary status or increasing demand from afib with RVR. Already getting abx. Will start on dilt gtt for rate control. Will discuss with CCM.    HR dropping into 40s/50s on occasion. Still afib. Cardizem stopped.  BP borderline. IVF. Will continue to watch.  INTUBATION Performed by: Virgel Manifold  Required items: required blood products, implants, devices, and special equipment available Patient identity confirmed: provided demographic data and hospital-assigned identification number Time out: Immediately prior to procedure a "time out" was called to verify the correct patient, procedure, equipment, support staff and site/side marked as required.  Indications: airway protection  Intubation method: Glidescope Laryngoscopy   Preoxygenation: BVM  Sedatives: Etomidate Paralytic: Succinylcholine  Tube Size: 7.5 cuffed  Post-procedure assessment: chest rise and ETCO2 monitor Breath sounds: equal and absent over the epigastrium Tube secured with: ETT holder Chest x-ray interpreted by radiologist and me.  Chest x-ray findings: endotracheal tube in appropriate position  Patient tolerated the procedure well with no immediate complications.   CRITICAL CARE Performed by: Virgel Manifold Total critical care time: 40 minutes Critical  care time was exclusive of separately billable procedures and treating other patients. Critical care was necessary to treat or prevent imminent or life-threatening deterioration. Critical care was time spent personally by me on the following activities: development of treatment plan with patient and/or surrogate as well as nursing, discussions with consultants, evaluation of patient's response to treatment, examination of patient, obtaining history from patient or surrogate, ordering and performing treatments and interventions, ordering and review of laboratory studies, ordering and review of radiographic studies, pulse oximetry and re-evaluation of patient's condition.   Dg Chest 2 View  Result Date: 09/14/2016 CLINICAL DATA:  Generalized body aches, atrial fibrillation. Sepsis. History of lymphoma. EXAM: CHEST  2 VIEW COMPARISON:  Chest radiograph September 07, 2016 FINDINGS: New patchy airspace opacities RIGHT lung and LEFT lung base. No pleural effusion. Stable cardiomegaly. Fullness of the RIGHT greater than LEFT hila is unchanged. Calcified aortic knob. Similar chronic interstitial changes. No pneumothorax. Soft tissue planes and included osseous structures are nonsuspicious. IMPRESSION: New bilateral patchy airspace opacities concerning for pneumonia. Followup PA and lateral chest X-ray is recommended in 3-4 weeks following trial of antibiotic therapy to ensure resolution and exclude underlying malignancy. Stable cardiomegaly and chronic interstitial changes. Stable fullness of the hila concerning for lymphadenopathy given patient's history of lymphoma. Electronically Signed   By: Elon Alas M.D.   On: 09/14/2016 04:49   Dg Chest 2 View  Result Date: 09/07/2016 CLINICAL DATA:  Tachycardia today. EXAM: CHEST  2 VIEW COMPARISON:  CT chest 01/10/2015.  PA and lateral chest 12/06/2011. FINDINGS: There is cardiomegaly without edema. Fullness of the right hilum consistent with lymphadenopathy as seen  on the prior CT scan is unchanged. The lungs are clear. There is no pneumothorax.  Small bilateral pleural effusions are seen. Aortic atherosclerosis is noted. IMPRESSION: Small bilateral pleural effusions. Cardiomegaly without edema. Atherosclerosis. Fullness of the right hilum consistent with lymphadenopathy as seen on prior CT scan is unchanged. Electronically Signed   By: Inge Rise M.D.   On: 09/07/2016 12:17   Ct Abdomen Pelvis W Contrast  Result Date: 08/29/2016 CLINICAL DATA:  Initial evaluation for acute abdominal pain, left rolling mass. EXAM: CT ABDOMEN AND PELVIS WITH CONTRAST TECHNIQUE: Multidetector CT imaging of the abdomen and pelvis was performed using the standard protocol following bolus administration of intravenous contrast. CONTRAST:  127mL ISOVUE-300 IOPAMIDOL (ISOVUE-300) INJECTION 61% COMPARISON:  Prior CT from 12/04/2011. FINDINGS: Lower chest: Scattered atelectatic changes noted within the visualized lung bases. Cardiomegaly partially visualized. No pleural or pericardial effusion. Hepatobiliary: Liver demonstrates a normal contrast enhanced appearance. Gallbladder within normal limits. No biliary dilatation. Pancreas: Pancreas somewhat atrophic but otherwise unremarkable without acute inflammation or mass lesion. Spleen: Subcentimeter hypodensity noted within central aspect of the spleen, of doubtful significance. Spleen otherwise unremarkable. Adrenals/Urinary Tract: Adrenal glands within normal limits. Kidneys relatively equal in size with symmetric enhancement. Scattered cortical thinning and scarring. 5.6 cm cyst noted extending from the lower pole left kidney. Additional scattered subcentimeter hypodensities within the bilateral kidneys too small the characterize, but statistically likely reflects small cysts as well. Multiple nonobstructive calculi present within the right kidney, largest of which positioned within the lower pole and measures approximately 15 mm. Mild right  hydroureteronephrosis. There is an obstructive 6 mm calculus at the right UVJ (series 2, image 64). No left-sided renal calculi. No left-sided hydronephrosis or hydroureter. Bladder largely decompressed. Mild circumferential bladder wall thickening like related incomplete distension. Stomach/Bowel: Stomach within normal limits. No evidence for bowel obstruction. Appendix within normal limits. No acute inflammatory changes seen about the bowels. Large volume stool seen packed within the rectal vault, suggesting constipation. Vascular/Lymphatic: Extensive atherosclerosis throughout the intra-abdominal aorta and its branch vessels. Origin of the celiac axis, SMA, renal arteries, and IMA are grossly patent. Occlusion of the left common iliac just distal to its origin (series 2, image 42). Left internal iliac occluded proximally, with distal reconstitution likely via collateralization. Occluded vascular stent in place within the left external iliac artery. Extensive atherosclerotic disease throughout the right iliac system. Fem-fem bypass graft in place. That right common femoral anastomosis appears patent. Large pseudoaneurysm measuring 4.4 x 5.7 x 7.5 cm arises from the left common femoral anastomosis in the left inguinal region (series 2, image 63). Opacification of the left common femoral artery distally. Reproductive: Scattered calcified uterine fibroids noted. Uterus and ovaries otherwise within normal limits. Other: No free air or fluid. Musculoskeletal: Small intramuscular lipoma noted within the left abdominus musculature. No acute osseous abnormality. No worrisome lytic or blastic osseous lesions. IMPRESSION: 1. 4.4 x 5.7 x 7.5 cm pseudoaneurysm arising from the left aspect of a fem-fem bypass graft in the left inguinal region. Finding suggestive of graft failure. Superimposed infection not excluded. 2. Extensive atherosclerotic calcifications throughout the intra-abdominal aorta and its branch vessels. 3. 6 mm  calculus at the right UVJ with secondary mild right hydroureteronephrosis. Additional nonobstructive right renal nephrolithiasis as above. 4. Large volume stool within the rectal vault, suggesting constipation. Critical Value/emergent results were called by telephone at the time of interpretation on 08/29/2016 at 11:29 pm to Dr. Gareth Morgan , who verbally acknowledged these results. Electronically Signed   By: Jeannine Boga M.D.   On: 08/29/2016 23:34   Ct Angio Ao+bifem W &  Or Wo Contrast  Result Date: 09/07/2016 CLINICAL DATA:  Status post repair of left femoral pseudoaneurysm with revision of right the left femoral- femoral bypass graft and left femoral-popliteal bypass graft. There has been some oozing of blood from the left groin operative site. EXAM: CT ANGIOGRAPHY OF ABDOMINAL AORTA WITH ILIOFEMORAL RUNOFF TECHNIQUE: Multidetector CT imaging of the abdomen, pelvis and lower extremities was performed using the standard protocol during bolus administration of intravenous contrast. Multiplanar CT image reconstructions and MIPs were obtained to evaluate the vascular anatomy. CONTRAST:  100 mL Isovue 370 IV COMPARISON:  CT of the abdomen and pelvis on 08/29/2016 FINDINGS: VASCULAR Aorta: The abdominal aorta and shows stable patency and atherosclerotic plaque. No evidence of aneurysmal disease or significant aortic stenosis. Celiac: Calcified plaque causes approximately 40- 50% origin stenosis. SMA: Calcified plaque causes approximately 50% origin stenosis. Renals: Bilateral single renal arteries without significant stenosis. IMA: Origin patent. The proximal trunk shows heavily calcified plaque without occlusion. RIGHT Lower Extremity Inflow: Calcified common iliac artery without significant stenosis. Internal and external iliac arteries show diffuse disease. Maximal narrowing of the proximal external iliac artery approaches 50%. Outflow: Calcified plaque causes focal narrowing of the common femoral  artery just prior to the femoral-femoral bypass graft. The artery is narrowed approximately 60- 70%. The femoral-femoral graft is patent but is poorly opacified distally in the left groin. There is some thickening around the distal graft extending superiorly and abutting the abdominal wall. There likely is some postoperative hemorrhage in this region as well as a few foci of air. No findings to suggest focal abscess. 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 Profunda femoral artery is patent. At the level of the popliteal artery, there is poor arterial opacification and popliteal patency cannot be adequately assessed. Runoff: Patency of runoff below the right knee cannot be adequately assessed, due to poor opacification of distal vessels. 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 Internal and external iliac arteries are also chronically occluded. At the level of the left groin, the distal aspect of the femoral- femoral bypass graft is poorly opacified and there is evidence now of a new synthetic femoral-popliteal bypass graft which is poorly opacified. Outflow: Unable to assess distal bypass graft and native popliteal artery patency. Runoff: Unable to assess runoff artery patency. Review of the MIP images confirms the above findings. NON-VASCULAR Lower chest: Bibasilar scarring and small bilateral pleural effusions, right greater than left. Hepatobiliary: No focal liver abnormality is seen. No gallstones, gallbladder wall thickening, or biliary dilatation. Pancreas: Unremarkable. No pancreatic ductal dilatation or surrounding inflammatory changes. Spleen: Normal in size without focal abnormality. Adrenals/Urinary Tract: Adrenal glands are unremarkable. Stable nonobstructing right renal calculi. Stable lower pole renal cyst on the left. Stable small right-sided bladder calculus. Stomach/Bowel: No evidence of bowel obstruction  or ileus. There is a fairly large amount of fecal material located in the rectum. No free air. Lymphatic: No enlarged lymph nodes identified. Reproductive: Uterus demonstrates calcified degenerated fibroids. No adnexal masses. Other: No free fluid or hernias. Musculoskeletal: Degenerative disc disease at L5-S1. IMPRESSION: VASCULAR 1. Poorly opacified distal segment of the right to left femoral- femoral bypass graft and poorly opacified new left femoral to popliteal bypass graft. Although some of this is felt to relate to contrast timing, there would be some concern of the potential for imminent thrombosis and vascular surgical assessment is recommended. The previously identified left groin pseudoaneurysm has been resected and repaired  and there is no evidence of recurrent pseudoaneurysm or active contrast extravasation. Some postoperative changes are evident around the graft likely representing some hemorrhage that extends superiorly abutting the abdominal wall. 2. The native right common femoral artery does demonstrate significant disease and focal moderate stenosis just proximal to the bypass graft anastomosis which could account for some potential inflow restriction into the femoral-femoral graft. 3. Poor opacification at the level of bilateral popliteal and tibial arteries on the CTA results and lack of accurate patency assessment of these vessels. NON-VASCULAR 1. Stable nonobstructing right renal calculi and bladder calculus. 2. Fairly large amount of stool is present in the rectum. Electronically Signed   By: Aletta Edouard M.D.   On: 09/07/2016 16:06   Dg Chest Portable 1 View  Result Date: 09/14/2016 CLINICAL DATA:  Intubated.  OG tube placement. EXAM: PORTABLE CHEST 1 VIEW COMPARISON:  09/14/2016 FINDINGS: Endotracheal tube is 4 cm above the carina. OG tube tip is in the midesophagus. Cardiomegaly. Patchy bilateral airspace opacities, right greater than left are similar to prior study. No effusions.  IMPRESSION: Endotracheal tube 4 cm above the carina. OG tube tip in the midthoracic esophagus. Recommend advancing. Stable patchy bilateral airspace opacities, right greater than left. Electronically Signed   By: Rolm Baptise M.D.   On: 09/14/2016 09:19   Dg Abd Portable 1v  Result Date: 09/14/2016 CLINICAL DATA:  NG tube placement. EXAM: PORTABLE ABDOMEN - 1 VIEW COMPARISON:  CT scan 09/07/2016 FINDINGS: The NG tube tip is in the body region the stomach. The bowel gas pattern is unremarkable. IMPRESSION: NG tube tip is in the body region of the stomach. Electronically Signed   By: Marijo Sanes M.D.   On: 09/14/2016 09:18     Virgel Manifold, MD 10/03/16 [phone]

## 2016-09-14 NOTE — ED Notes (Signed)
Call report to [facility] at [phone] at [place]. Pt to go to room 1405.

## 2016-09-14 NOTE — ED Notes (Signed)
Patient calling out stating she is having shortness of breath and headache. Use of accessory muscles and patient anxious. HR irregular and 154 (ST) on monitor. Dr. Wilson Singer notified and at the bedside. Oxygen increased to 3L/[place] 95%, blood pressure 164/98, HR 136, resp 24. EKG performed and in Dr. Wilson Singer in room during time. Respiratory called for bipap.

## 2016-09-15 DIAGNOSIS — J9600 Acute respiratory failure, unspecified whether with hypoxia or hypercapnia: Secondary | ICD-10-CM

## 2016-09-15 LAB — CBC WITH DIFFERENTIAL/PLATELET
BASOS ABS: 0 10*3/uL (ref 0.0–0.1)
Basophils Relative: 0 %
EOS ABS: 0.3 10*3/uL (ref 0.0–0.7)
Eosinophils Relative: 3 %
HCT: 28 % — ABNORMAL LOW (ref 36.0–46.0)
Hemoglobin: 9.4 g/dL — ABNORMAL LOW (ref 12.0–15.0)
Lymphocytes Relative: 13 %
Lymphs Abs: 1.2 10*3/uL (ref 0.7–4.0)
MCH: 27.9 pg (ref 26.0–34.0)
MCHC: 33.6 g/dL (ref 30.0–36.0)
MCV: 83.1 fL (ref 78.0–100.0)
MONO ABS: 0.7 10*3/uL (ref 0.1–1.0)
Monocytes Relative: 8 %
NEUTROS PCT: 76 %
Neutro Abs: 6.8 10*3/uL (ref 1.7–7.7)
PLATELETS: 272 10*3/uL (ref 150–400)
RBC: 3.37 MIL/uL — AB (ref 3.87–5.11)
RDW: 16.2 % — AB (ref 11.5–15.5)
WBC: 9 10*3/uL (ref 4.0–10.5)

## 2016-09-15 LAB — COMPREHENSIVE METABOLIC PANEL
ALT: 14 U/L (ref 14–54)
ANION GAP: 9 (ref 5–15)
AST: 27 U/L (ref 15–41)
Albumin: 2.6 g/dL — ABNORMAL LOW (ref 3.5–5.0)
Alkaline Phosphatase: 69 U/L (ref 38–126)
BUN: 20 mg/dL (ref 6–20)
CHLORIDE: 110 mmol/L (ref 101–111)
CO2: 23 mmol/L (ref 22–32)
Calcium: 9 mg/dL (ref 8.9–10.3)
Creatinine, Ser: 1.16 mg/dL — ABNORMAL HIGH (ref 0.44–1.00)
GFR calc Af Amer: 52 mL/min — ABNORMAL LOW (ref >60)
GFR, EST NON AFRICAN AMERICAN: 45 mL/min — AB (ref >60)
Glucose, Bld: 178 mg/dL — ABNORMAL HIGH (ref 65–99)
POTASSIUM: 4 mmol/L (ref 3.5–5.1)
Sodium: 142 mmol/L (ref 135–145)
Total Bilirubin: 0.8 mg/dL (ref 0.3–1.2)
Total Protein: 6.1 g/dL — ABNORMAL LOW (ref 6.5–8.1)

## 2016-09-15 LAB — GLUCOSE, CAPILLARY
GLUCOSE-CAPILLARY: 181 mg/dL — AB (ref 65–99)
GLUCOSE-CAPILLARY: 170 mg/dL — AB (ref 65–99)
GLUCOSE-CAPILLARY: 196 mg/dL — AB (ref 65–99)
Glucose-Capillary: 164 mg/dL — ABNORMAL HIGH (ref 65–99)
Glucose-Capillary: 150 mg/dL — ABNORMAL HIGH (ref 65–99)
Glucose-Capillary: 190 mg/dL — ABNORMAL HIGH (ref 65–99)

## 2016-09-15 LAB — PHOSPHORUS
PHOSPHORUS: 2.4 mg/dL — AB (ref 2.5–4.6)
PHOSPHORUS: 2.7 mg/dL (ref 2.5–4.6)

## 2016-09-15 LAB — PROTIME-INR
INR: 1.3
Prothrombin Time: 16.3 seconds — ABNORMAL HIGH (ref 11.4–15.2)

## 2016-09-15 LAB — MAGNESIUM
MAGNESIUM: 1.1 mg/dL — AB (ref 1.7–2.4)
MAGNESIUM: 1.2 mg/dL — AB (ref 1.7–2.4)

## 2016-09-15 LAB — PROCALCITONIN: PROCALCITONIN: 0.33 ng/mL

## 2016-09-15 LAB — HEPARIN LEVEL (UNFRACTIONATED): HEPARIN UNFRACTIONATED: 1.08 [IU]/mL — AB (ref 0.30–0.70)

## 2016-09-15 LAB — APTT: APTT: 86 s — AB (ref 24–36)

## 2016-09-15 MED ORDER — HEPARIN (PORCINE) IN NACL 100-0.45 UNIT/ML-% IJ SOLN
850.0000 [IU]/h | INTRAMUSCULAR | Status: DC
  Administered 2016-09-16: 850 [IU]/h via INTRAVENOUS

## 2016-09-15 MED ORDER — METOPROLOL TARTRATE 5 MG/5ML IV SOLN
2.5000 mg | INTRAVENOUS | Status: DC | PRN
  Administered 2016-09-17 (×2): 2.5 mg via INTRAVENOUS
  Administered 2016-09-19 – 2016-09-20 (×4): 5 mg via INTRAVENOUS
  Filled 2016-09-15 (×6): qty 5

## 2016-09-15 MED ORDER — HEPARIN (PORCINE) IN NACL 100-0.45 UNIT/ML-% IJ SOLN
950.0000 [IU]/h | INTRAMUSCULAR | Status: DC
  Administered 2016-09-15: 950 [IU]/h via INTRAVENOUS
  Filled 2016-09-15: qty 250

## 2016-09-15 NOTE — Progress Notes (Signed)
ANTICOAGULATION CONSULT NOTE - Initial Consult  Pharmacy Consult for Heparin Indication: atrial fibrillation  Allergies  Allergen Reactions  . Penicillins Other (See Comments)    Told by a doctor to "not take" and on MAR as an allergy Has patient had a PCN reaction causing immediate rash, facial/tongue/throat swelling, SOB or lightheadedness with hypotension: Unknown Has patient had a PCN reaction causing severe rash involving mucus membranes or skin necrosis: Unknown Has patient had a PCN reaction that required hospitalization: Unknown Has patient had a PCN reaction occurring within the last 10 years: Unknown If all of the above answers are "NO", then may proceed with Ceph    Patient Measurements: Height: _0  (160 cm) Weight: 142 lb 6.7 oz (64.6 kg) IBW/kg (Calculated) : 52.4 Heparin Dosing Weight: 64.6 kg  Vital Signs: Temp: 97.9 F (36.6 C) (08/18 0800) Temp Source: Oral (08/18 0800) BP: 148/87 (08/18 1300) Pulse Rate: 71 (08/18 0811)  Labs:  Recent Labs  09/14/16 0535 09/14/16 0549 09/14/16 0553 09/14/16 1049 09/14/16 1050 09/14/16 1423 09/15/16 0504 09/15/16 0716  HGB 13.1  --  7.8* 9.4*  --   --   --  9.4*  HCT 39.9  --  23.0* 29.1*  --   --   --  28.0*  PLT 273  --   --  285  --   --   --  272  CREATININE 1.31*  --  1.20*  --   --   --  1.16*  --   TROPONINI  --  0.06*  --   --  0.07* 0.07*  --   --     Estimated Creatinine Clearance: 37.3 mL/min (A) (by C-G formula based on SCr of 1.16 mg/dL (H)).   Medical History: Past Medical History:  Diagnosis Date  . Abdominal or pelvic swelling, mass, or lump, left upper quadrant   . CHF (congestive heart failure) ([place])   . Chronic atrial fibrillation ([place])   . Chronic kidney disease   . Congestive heart failure, unspecified   . Coronary atherosclerosis of unspecified type of vessel, native or graft   . Diabetes mellitus   . Edema   . GERD (gastroesophageal reflux disease)   . Gout, unspecified   .  Hypercholesterolemia   . Hypertension   . 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  . Peripheral vascular disease, unspecified ([place])   . Seizures ([place])   . Sinus of Valsalva aneurysm    a. By 2D echo 05/2011.  . Stroke ([place])   . Unspecified hereditary and idiopathic peripheral neuropathy   . Unspecified urinary incontinence   . Vascular dementia, uncomplicated     Medications:  Scheduled:  . allopurinol  100 mg Per Tube Daily  . aspirin  81 mg Per Tube Daily  . atorvastatin  10 mg Per Tube QHS  . bacitracin  1 application Topical BID  . chlorhexidine gluconate (MEDLINE KIT)  15 mL Mouth Rinse BID  . donepezil  10 mg Per Tube QHS  . feeding supplement (VITAL HIGH PROTEIN)  1,000 mL Per Tube Q24H  . heparin subcutaneous  5,000 Units Subcutaneous Q8H  . lactobacillus  1 g Per Tube TID WC  . levETIRAcetam  250 mg Per Tube BID  . mouth rinse  15 mL Mouth Rinse QID  . metoprolol tartrate  25 mg Per Tube QHS  .  pantoprazole (PROTONIX) IV  40 mg Intravenous Daily  . sertraline  50 mg Per Tube QHS   Infusions:  . ceFEPime (MAXIPIME) IV Stopped (09/15/16 1200)  . dexmedetomidine (PRECEDEX) IV infusion 1 mcg/kg/hr (09/15/16 1300)  . vancomycin Stopped (09/15/16 0630)   PRN: acetaminophen, albuterol, bisacodyl, fentaNYL (SUBLIMAZE) injection, fentaNYL (SUBLIMAZE) injection, ipratropium-albuterol, metoprolol tartrate  Assessment: 76 yo female with dementia, hx CVA, seizures, bedbound, recent repair of left femoral pseduo aneurysm complictaed with infection at the surgical site treated with doxycycline now admitted with subjective fever cough shortness of breath and generalized body aches. On tele, found to be in Afib with RVR and started on cardizem drip which was stopped due to hypotension.  Pharmacy is consulted to dose IV heparin for  anticoagulation for afib.  Last dose of SQ heparin received at 13:38  Baseline coags pending  Hgb 9.4, Plts wnl  No bleeding reported  Goal of Therapy:  Heparin level 0.3-0.7 units/ml Monitor platelets by anticoagulation protocol: Yes   Plan:   No heparin bolus since received SQ heparin at 13:38  Heparin IV infusion 950 units/hr   Check heparin level 8hr after starting  Daily heparin level, CBC  Peggyann Juba, PharmD, BCPS Pager: [phone] 09/15/2016,1:39 PM

## 2016-09-15 NOTE — Progress Notes (Signed)
PULMONARY / CRITICAL CARE MEDICINE   Name: Casey [name] MRN: [id number] DOB: 1940-09-30    ADMISSION DATE:  09/14/2016 CONSULTATION DATE:  8/17  REFERRING MD:  Rodena Piety   CHIEF COMPLAINT:  Acute respiratory failure   HISTORY OF PRESENT ILLNESS:   This is a 76 year old female who was just discharged from [facility] on 8/12 after being admitted for wound infection s/p left femoral pseudoaneurysm repair. Cultures grew: SA on reflexed ID panel. She was treated initially w/ IV abx, seen by vascular surg, wound vac placed and sent back to SNF on [facility]. The patient at baseline has dementia, is bed bound, and poor historian, her EF is 35-40%. Admitted from the SNF w/ report of subjective fever, shortness of breath, cough and generalized body aches.  ER course:  Afib on tele, not febrile, uncooperative, LA < 2, CXR w/ new patchy infiltrates, given IV ABX and IVFs. Was to be admitted to tele.  While waiting for tele bed-->developed AF w/ RVR HR 150-170s. RR elevated, sats 80% on 2 liters, was combative and could not be placed on BIPAP. Intubated by EDP. PCCM asked to assess.   PAST MEDICAL HISTORY :  She  has a past medical history of Abdominal or pelvic swelling, mass, or lump, left upper quadrant; CHF (congestive heart failure) ([place]); Chronic atrial fibrillation ([place]); Chronic kidney disease; Congestive heart failure, unspecified; Coronary atherosclerosis of unspecified type of vessel, native or graft; Diabetes mellitus; Edema; GERD (gastroesophageal reflux disease); Gout, unspecified; Hypercholesterolemia; Hypertension; Legally blind (03/09/2014); Lymphoma ([place]); Peripheral vascular disease, unspecified ([place]); Seizures ([place]); Sinus of Valsalva aneurysm; Stroke [facility]); Unspecified hereditary and idiopathic peripheral neuropathy; Unspecified urinary incontinence; and Vascular dementia, uncomplicated.  PAST SURGICAL HISTORY: She  has a past surgical history that includes femoral to femoral bypass graft ;  Femoral artery debridement (12/06/2011); Femoral-femoral Bypass Graft (12/06/2011); False aneurysm repair (12/06/2011); Femoral-popliteal Bypass Graft (Left, 05/2002); Femoral-popliteal Bypass Graft (Left, 11/2002); Tubal ligation (Bilateral); Aortogram (09/01/2008); False aneurysm repair (Left, 08/30/2016); Femoral-femoral Bypass Graft (Left, 08/30/2016); Femoral-popliteal Bypass Graft (Left, 08/30/2016); Thrombectomy femoral artery (Left, 08/30/2016); Endarterectomy femoral (Left, 10/04/6211); and Application if wound vac (Left, 08/30/2016).  Allergies  Allergen Reactions  . Penicillins Other (See Comments)    Told by a doctor to "not take" and on MAR as an allergy Has patient had a PCN reaction causing immediate rash, facial/tongue/throat swelling, SOB or lightheadedness with hypotension: Unknown Has patient had a PCN reaction causing severe rash involving mucus membranes or skin necrosis: Unknown Has patient had a PCN reaction that required hospitalization: Unknown Has patient had a PCN reaction occurring within the last 10 years: Unknown If all of the above answers are "NO", then may proceed with Ceph    No current facility-administered medications on file prior to encounter.    Current Outpatient Prescriptions on File Prior to Encounter  Medication Sig  . acetaminophen (TYLENOL) 325 MG tablet Take 2 tablets (650 mg total) by mouth every 6 (six) hours as needed for mild pain (or Fever >/= 101).  Marland Kitchen allopurinol (ZYLOPRIM) 100 MG tablet Take 100 mg by mouth daily.  Marland Kitchen aspirin 81 MG tablet Take 81 mg by mouth daily.  Marland Kitchen atorvastatin (LIPITOR) 10 MG tablet Take 10 mg by mouth at bedtime.   . bacitracin (BACIGUENT) 500 UNIT/GM ointment Apply to Right Hallux topically every day shift for 10 days for Right Ingrown Toenail  . bisacodyl (BISCOLAX) 10 MG suppository Place 10 mg rectally as needed for moderate constipation.  . CETYLPYRIDINIUM CHLORIDE  MT Give 15 ml by mouth two times a day for oral irritation  .  donepezil (ARICEPT) 10 MG tablet Take 1 tablet (10 mg total) by mouth at bedtime.  Marland Kitchen doxycycline (VIBRA-TABS) 100 MG tablet Take 1 tablet (100 mg total) by mouth every 12 (twelve) hours.  Marland Kitchen guaiFENesin-dextromethorphan (ROBITUSSIN DM) 100-10 MG/5ML syrup Take 15 mLs by mouth every 4 (four) hours as needed for cough.  . levETIRAcetam (KEPPRA) 250 MG tablet Take 250 mg by mouth 2 (two) times daily.  . metoprolol tartrate (LOPRESSOR) 25 MG tablet Give 2 tablets (50mg ) by mouth in the morning and 1 tablet by mouth at bedtime  . mirtazapine (REMERON) 7.5 MG tablet Take 7.5 mg by mouth at bedtime.  Marland Kitchen oxyCODONE (OXY IR/ROXICODONE) 5 MG immediate release tablet Take 1 tablet (5 mg total) by mouth every 6 (six) hours as needed for severe pain.  . pantoprazole (PROTONIX) 40 MG tablet Take 40 mg by mouth daily.  . sertraline (ZOLOFT) 50 MG tablet Take 50 mg by mouth at bedtime.     FAMILY HISTORY:  Her indicated that her mother is deceased. She indicated that her father is deceased. She indicated that her sister is deceased. She indicated that only one of her three brothers is alive. She indicated that her daughter is alive. She indicated that her son is alive.    SOCIAL HISTORY: She  reports that she quit smoking about 15 years ago. Her smoking use included Cigarettes. She has never used smokeless tobacco. She reports that she does not drink alcohol or use drugs.  REVIEW OF SYSTEMS:   Not able to obtain as pt is sedated  SUBJECTIVE:  No issues overnight. Remains on vent Off cardizem drip  VITAL SIGNS: BP (!) 153/83 (BP Location: Left Arm)   Pulse 71   Temp 97.9 F (36.6 C) (Oral)   Resp 16   Ht 5\' 3"  (1.6 m)   Wt 64.6 kg (142 lb 6.7 oz)   SpO2 100%   BMI 25.23 kg/m   HEMODYNAMICS:    VENTILATOR SETTINGS: Vent Mode: PRVC FiO2 (%):  [40 %] 40 % Set Rate:  [16 bmp] 16 bmp Vt Set:  [500 mL] 500 mL PEEP:  [5 cmH20] 5 cmH20 Plateau Pressure:  [19 cmH20-23 cmH20] 20 cmH20  INTAKE /  OUTPUT: I/O last 3 completed shifts: In: 444.7 [I.V.:194.7; IV Piggyback:250] Out: -   PHYSICAL EXAMINATION: Gen:      No acute distress HEENT:  EOMI, sclera anicteric, ETT in place Neck:     No masses; no thyromegaly Lungs:    B/L rhonchi; normal respiratory effort CV:         Regular rate and rhythm; no murmurs Abd:      + bowel sounds; soft, non-tender; no palpable masses, no distension Ext:    No edema; adequate peripheral perfusion,  Skin:      Warm and dry; no rash Neuro: Sedated, unresponsive  LABS:  BMET  Recent Labs Lab 09/09/16 0245  09/14/16 0535 09/14/16 0553 09/15/16 0504  NA 141  < > 142 142 142  K 3.9  < > 4.6 4.4 4.0  CL 107  --  109 105 110  CO2 27  --  26  --  23  BUN 23*  < > 24* 24* 20  CREATININE 1.50*  < > 1.31* 1.20* 1.16*  GLUCOSE 181*  --  180* 184* 178*  < > = values in this interval not displayed.  Electrolytes  Recent  Labs Lab 09/09/16 0245 09/14/16 0535 09/14/16 1049 09/14/16 1431 09/15/16 0504  CALCIUM 9.2 9.7  --   --  9.0  MG  --   --  1.2* 1.2* 1.1*  PHOS  --   --  2.9 2.6 2.4*    CBC  Recent Labs Lab 09/14/16 0535 09/14/16 0553 09/14/16 1049 09/15/16 0716  WBC 7.0  --  10.4 9.0  HGB 13.1 7.8* 9.4* 9.4*  HCT 39.9 23.0* 29.1* 28.0*  PLT 273  --  285 272    Coag's No results for input(s): APTT, INR in the last 168 hours.  Sepsis Markers  Recent Labs Lab 09/14/16 0633 09/14/16 0840 09/14/16 1050 09/15/16 0504  LATICACIDVEN 1.86 1.92*  --   --   PROCALCITON  --   --  0.10 0.33    ABG  Recent Labs Lab 09/14/16 0915  PHART 7.357  PCO2ART 39.6  PO2ART 357*    Liver Enzymes  Recent Labs Lab 09/14/16 0535 09/15/16 0504  AST 20 27  ALT 12* 14  ALKPHOS 82 69  BILITOT 0.4 0.8  ALBUMIN 3.2* 2.6*    Cardiac Enzymes  Recent Labs Lab 09/14/16 0549 09/14/16 1050 09/14/16 1423  TROPONINI 0.06* 0.07* 0.07*    Glucose  Recent Labs Lab 09/09/16 1234 09/14/16 1925 09/14/16 2331  09/15/16 0401 09/15/16 0815 09/15/16 1145  GLUCAP 180* 138* 141* 181* 164* 150*    Imaging No results found.   STUDIES:    CULTURES: BCX 2 8/17>>>  ANTIBIOTICS: vanc 8/17>>> Zosyn 8/17>>8/17 (PCN allergy but has tolerated cephalosporins) cefepime 8/17>>>  SIGNIFICANT EVENTS:   LINES/TUBES: oett 8/17>>>  DISCUSSION: Presents w/ general aches and pains. CXR w/ bilateral airspace disease and AF. Not clear if this is actually a HCAP vs pulmonary edema. A possible scenario would be HCAP-->AF w/ RVR-->leading to acute decompensated heart failure.  She is full code but her son describes a story of significant decline over the last year. I suspect that we can get her through this acute event but we should explore if this is something that we should do in the future. I generally do not recommend aggressive & invasive measures in patients w/ advanced dementia and failure to thrive.   ASSESSMENT / PLAN:  PULMONARY A: Acute Hypoxic Respiratory failure in setting of progressive and diffuse airspace disease.  Pulm edema vs HCAP P:   Continue vent support Daily SBTs   CARDIOVASCULAR A:  Acute systolic HF (EF 90-24%) w/ pulmonary edema AF w/ RVR w/ h/o CAF Severe PVD s/p recent repair of left femoral pseudoaneurysm  P:  Lopressor PRN for a fib Lasix 40 mg repeat today Will need to start heparin for anticoagulation  RENAL A:   Stage 3 CKD (baseline cr 1.28 to 1.5)  P:   Follow urine output and Cr  GASTROINTESTINAL A:   Severe protein calorie malnutrition w/ cachexia  P:   PPI Start tube feeds  HEMATOLOGIC A:   Anemia of chronic disease.  CBC shows acute hgb drop but repeat is stable.  No evidence of bleed P:  SCDs Monitor CBC  INFECTIOUS A:   Possible HCAP Recent left femoral wound infection  P:   BCX2 Sputum culture  Continue vanco, cefepime  ENDOCRINE A:   Diabetes w/ hyperglycemia  P:   SSI coverage  NEUROLOGIC A:   Severe dementia  Bed  bound since stroke 3 years ago w/right sided weakness  P:   RASS goal: -1 PAD protocol    FAMILY  -  Updates: spoke to son Micheal over the phone 8/17. No family at bedside 8/18 - Inter-disciplinary family meet or Palliative Care meeting due by:  8/24  The patient is critically ill with multiple organ system failure and requires high complexity decision making for assessment and support, frequent evaluation and titration of therapies, advanced monitoring, review of radiographic studies and interpretation of complex data.   Critical Care Time devoted to patient care services, exclusive of separately billable procedures, described in this note is 35 minutes.   Marshell Garfinkel MD [facility] Pager [phone] If no answer or after 3pm call: [phone] 09/15/2016, 1:07 PM

## 2016-09-15 NOTE — Progress Notes (Signed)
Patient with only 50cc documented urine output this shift.  Bladder scan reveals >500cc present.  Dr. Vaughan Browner notified at bedside.  Will continue to monitor.

## 2016-09-16 ENCOUNTER — Inpatient Hospital Stay (HOSPITAL_COMMUNITY): Payer: Medicare Other

## 2016-09-16 LAB — GLUCOSE, CAPILLARY
GLUCOSE-CAPILLARY: 221 mg/dL — AB (ref 65–99)
GLUCOSE-CAPILLARY: 177 mg/dL — AB (ref 65–99)
GLUCOSE-CAPILLARY: 184 mg/dL — AB (ref 65–99)
Glucose-Capillary: 224 mg/dL — ABNORMAL HIGH (ref 65–99)
Glucose-Capillary: 104 mg/dL — ABNORMAL HIGH (ref 65–99)
Glucose-Capillary: 137 mg/dL — ABNORMAL HIGH (ref 65–99)

## 2016-09-16 LAB — CULTURE, RESPIRATORY: CULTURE: NO GROWTH

## 2016-09-16 LAB — CBC
HEMATOCRIT: 27.8 % — AB (ref 36.0–46.0)
HEMOGLOBIN: 9.3 g/dL — AB (ref 12.0–15.0)
MCH: 27.4 pg (ref 26.0–34.0)
MCHC: 33.5 g/dL (ref 30.0–36.0)
MCV: 81.8 fL (ref 78.0–100.0)
Platelets: 285 10*3/uL (ref 150–400)
RBC: 3.4 MIL/uL — ABNORMAL LOW (ref 3.87–5.11)
RDW: 16.2 % — ABNORMAL HIGH (ref 11.5–15.5)
WBC: 9.6 10*3/uL (ref 4.0–10.5)

## 2016-09-16 LAB — BASIC METABOLIC PANEL
ANION GAP: 7 (ref 5–15)
BUN: 22 mg/dL — ABNORMAL HIGH (ref 6–20)
CHLORIDE: 109 mmol/L (ref 101–111)
CO2: 23 mmol/L (ref 22–32)
Calcium: 9.1 mg/dL (ref 8.9–10.3)
Creatinine, Ser: 1.33 mg/dL — ABNORMAL HIGH (ref 0.44–1.00)
GFR calc non Af Amer: 38 mL/min — ABNORMAL LOW (ref >60)
GFR, EST AFRICAN AMERICAN: 44 mL/min — AB (ref >60)
Glucose, Bld: 235 mg/dL — ABNORMAL HIGH (ref 65–99)
POTASSIUM: 3.9 mmol/L (ref 3.5–5.1)
Sodium: 139 mmol/L (ref 135–145)

## 2016-09-16 LAB — MAGNESIUM: Magnesium: 1.2 mg/dL — ABNORMAL LOW (ref 1.7–2.4)

## 2016-09-16 LAB — PROCALCITONIN: PROCALCITONIN: 0.2 ng/mL

## 2016-09-16 LAB — CULTURE, RESPIRATORY W GRAM STAIN

## 2016-09-16 LAB — PHOSPHORUS: PHOSPHORUS: 3 mg/dL (ref 2.5–4.6)

## 2016-09-16 LAB — HEPARIN LEVEL (UNFRACTIONATED)
HEPARIN UNFRACTIONATED: 0.84 [IU]/mL — AB (ref 0.30–0.70)
Heparin Unfractionated: 0.67 IU/mL (ref 0.30–0.70)

## 2016-09-16 MED ORDER — FENTANYL CITRATE (PF) 100 MCG/2ML IJ SOLN: 50.0000 ug | INTRAMUSCULAR | Status: DC | PRN

## 2016-09-16 MED ORDER — MIDAZOLAM HCL 2 MG/2ML IJ SOLN
1.0000 mg | INTRAMUSCULAR | Status: DC | PRN
  Administered 2016-09-16 (×2): 1 mg via INTRAVENOUS
  Filled 2016-09-16 (×2): qty 2

## 2016-09-16 MED ORDER — HEPARIN (PORCINE) IN NACL 100-0.45 UNIT/ML-% IJ SOLN
600.0000 [IU]/h | INTRAMUSCULAR | Status: DC
  Administered 2016-09-16: 600 [IU]/h via INTRAVENOUS

## 2016-09-16 MED ORDER — HEPARIN (PORCINE) IN NACL 100-0.45 UNIT/ML-% IJ SOLN
700.0000 [IU]/h | INTRAMUSCULAR | Status: DC
  Filled 2016-09-16: qty 250

## 2016-09-16 MED ORDER — INSULIN ASPART 100 UNIT/ML ~~LOC~~ SOLN
0.0000 [IU] | SUBCUTANEOUS | Status: DC
  Administered 2016-09-16 (×2): 3 [IU] via SUBCUTANEOUS
  Administered 2016-09-16 (×2): 5 [IU] via SUBCUTANEOUS
  Administered 2016-09-16: 3 [IU] via SUBCUTANEOUS
  Administered 2016-09-17: 2 [IU] via SUBCUTANEOUS
  Administered 2016-09-17 (×4): 3 [IU] via SUBCUTANEOUS
  Administered 2016-09-17: 2 [IU] via SUBCUTANEOUS
  Administered 2016-09-18 (×3): 3 [IU] via SUBCUTANEOUS
  Administered 2016-09-18 (×2): 5 [IU] via SUBCUTANEOUS
  Administered 2016-09-18: 3 [IU] via SUBCUTANEOUS
  Administered 2016-09-19: 2 [IU] via SUBCUTANEOUS
  Administered 2016-09-19: 3 [IU] via SUBCUTANEOUS
  Administered 2016-09-19: 5 [IU] via SUBCUTANEOUS
  Administered 2016-09-19: 3 [IU] via SUBCUTANEOUS
  Administered 2016-09-19: 2 [IU] via SUBCUTANEOUS
  Administered 2016-09-20 (×2): 3 [IU] via SUBCUTANEOUS
  Administered 2016-09-20: 2 [IU] via SUBCUTANEOUS
  Administered 2016-09-20: 3 [IU] via SUBCUTANEOUS
  Administered 2016-09-20 (×2): 5 [IU] via SUBCUTANEOUS
  Administered 2016-09-20 – 2016-09-21 (×2): 3 [IU] via SUBCUTANEOUS
  Administered 2016-09-21: 2 [IU] via SUBCUTANEOUS
  Administered 2016-09-21 (×2): 5 [IU] via SUBCUTANEOUS
  Administered 2016-09-21: 3 [IU] via SUBCUTANEOUS
  Administered 2016-09-22 (×3): 2 [IU] via SUBCUTANEOUS
  Administered 2016-09-22 (×2): 3 [IU] via SUBCUTANEOUS
  Administered 2016-09-22: 8 [IU] via SUBCUTANEOUS
  Administered 2016-09-23: 3 [IU] via SUBCUTANEOUS
  Administered 2016-09-23: 5 [IU] via SUBCUTANEOUS
  Administered 2016-09-23: 2 [IU] via SUBCUTANEOUS
  Administered 2016-09-23: 3 [IU] via SUBCUTANEOUS
  Administered 2016-09-23: 5 [IU] via SUBCUTANEOUS
  Administered 2016-09-24 – 2016-09-26 (×7): 2 [IU] via SUBCUTANEOUS
  Administered 2016-09-26: 3 [IU] via SUBCUTANEOUS
  Administered 2016-09-26 (×3): 2 [IU] via SUBCUTANEOUS
  Administered 2016-09-26: 3 [IU] via SUBCUTANEOUS
  Administered 2016-09-27 – 2016-09-29 (×9): 2 [IU] via SUBCUTANEOUS
  Administered 2016-09-29: 3 [IU] via SUBCUTANEOUS
  Administered 2016-09-29: 2 [IU] via SUBCUTANEOUS
  Administered 2016-09-29: 3 [IU] via SUBCUTANEOUS
  Administered 2016-09-29: 2 [IU] via SUBCUTANEOUS
  Administered 2016-09-30 (×3): 3 [IU] via SUBCUTANEOUS
  Administered 2016-09-30: 5 [IU] via SUBCUTANEOUS
  Administered 2016-09-30 (×2): 3 [IU] via SUBCUTANEOUS
  Administered 2016-10-01 (×3): 5 [IU] via SUBCUTANEOUS
  Administered 2016-10-01: 8 [IU] via SUBCUTANEOUS
  Administered 2016-10-01 (×2): 5 [IU] via SUBCUTANEOUS
  Administered 2016-10-02: 3 [IU] via SUBCUTANEOUS
  Administered 2016-10-02: 5 [IU] via SUBCUTANEOUS
  Administered 2016-10-02 (×3): 8 [IU] via SUBCUTANEOUS
  Administered 2016-10-02 – 2016-10-03 (×6): 3 [IU] via SUBCUTANEOUS
  Administered 2016-10-03: 2 [IU] via SUBCUTANEOUS
  Administered 2016-10-03: 3 [IU] via SUBCUTANEOUS
  Administered 2016-10-04 – 2016-10-05 (×7): 2 [IU] via SUBCUTANEOUS

## 2016-09-16 MED ORDER — FENTANYL CITRATE (PF) 100 MCG/2ML IJ SOLN
50.0000 ug | INTRAMUSCULAR | Status: DC | PRN
  Administered 2016-09-16 – 2016-09-19 (×6): 50 ug via INTRAVENOUS
  Filled 2016-09-16 (×6): qty 2

## 2016-09-16 MED ORDER — MIDAZOLAM HCL 2 MG/2ML IJ SOLN
1.0000 mg | INTRAMUSCULAR | Status: DC | PRN
  Administered 2016-09-17 – 2016-09-19 (×6): 1 mg via INTRAVENOUS
  Filled 2016-09-16 (×6): qty 2

## 2016-09-16 NOTE — Progress Notes (Signed)
ANTICOAGULATION CONSULT NOTE - Follow Up Consult  Pharmacy Consult for Heparin Indication: atrial fibrillation  Allergies  Allergen Reactions  . Penicillins Other (See Comments)    Told by a doctor to "not take" and on MAR as an allergy Has patient had a PCN reaction causing immediate rash, facial/tongue/throat swelling, SOB or lightheadedness with hypotension: Unknown Has patient had a PCN reaction causing severe rash involving mucus membranes or skin necrosis: Unknown Has patient had a PCN reaction that required hospitalization: Unknown Has patient had a PCN reaction occurring within the last 10 years: Unknown If all of the above answers are "NO", then may proceed with Ceph    Patient Measurements: Height: 5\' 3"  (160 cm) Weight: 150 lb 9.2 oz (68.3 kg) IBW/kg (Calculated) : 52.4 Heparin Dosing Weight:   Vital Signs: Temp: 96.5 F (35.8 C) (08/19 0425) Temp Source: Axillary (08/19 0425) BP: 132/62 (08/19 0400) Pulse Rate: 54 (08/19 0400)  Labs:  Recent Labs  09/14/16 0535 09/14/16 0549 09/14/16 0553 09/14/16 1049 09/14/16 1050 09/14/16 1423 09/15/16 0504 09/15/16 0716 09/15/16 1558 09/15/16 2224  HGB 13.1  --  7.8* 9.4*  --   --   --  9.4*  --   --   HCT 39.9  --  23.0* 29.1*  --   --   --  28.0*  --   --   PLT 273  --   --  285  --   --   --  272  --   --   APTT  --   --   --   --   --   --   --   --  86*  --   LABPROT  --   --   --   --   --   --   --   --  16.3*  --   INR  --   --   --   --   --   --   --   --  1.30  --   HEPARINUNFRC  --   --   --   --   --   --   --   --   --  1.08*  CREATININE 1.31*  --  1.20*  --   --   --  1.16*  --   --   --   TROPONINI  --  0.06*  --   --  0.07* 0.07*  --   --   --   --     Estimated Creatinine Clearance: 38.3 mL/min (A) (by C-G formula based on SCr of 1.16 mg/dL (H)).   Medications:  Infusions:  . ceFEPime (MAXIPIME) IV Stopped (09/15/16 1200)  . dexmedetomidine (PRECEDEX) IV infusion 0.8 mcg/kg/hr (09/16/16  0128)  . heparin 850 Units/hr (09/16/16 0100)  . vancomycin Stopped (09/15/16 0630)    Assessment: Patient with high heparin level.  No heparin issues per RN.  Goal of Therapy:  Heparin level 0.3-0.7 units/ml Monitor platelets by anticoagulation protocol: Yes   Plan:  Hold heparin until midnight Restart heparin at 850 units/hr Recheck level at 1000.  Tyler Deis, Shea Stakes [place] 09/16/2016,4:37 AM

## 2016-09-16 NOTE — Progress Notes (Signed)
eLink Physician-Brief Progress Note Patient Name: SCHERYL SANBORN DOB: 1940/11/16 MRN: [id number]   Date of Service  09/16/2016  HPI/Events of Note    eICU Interventions  SSI ordered     Intervention Category Intermediate Interventions: Hyperglycemia - evaluation and treatment  Rosalie Buenaventura S. 09/16/2016, 1:22 AM

## 2016-09-16 NOTE — Progress Notes (Signed)
PULMONARY / CRITICAL CARE MEDICINE   Name: Casey [name] MRN: [id number] DOB: [birth year]    ADMISSION DATE:  09/14/2016 CONSULTATION DATE:  8/17  REFERRING MD:  Rodena Piety   CHIEF COMPLAINT:  Acute respiratory failure   HISTORY OF PRESENT ILLNESS:   This is a 76 year old female who was just discharged from [facility] on 8/12 after being admitted for wound infection s/p left femoral pseudoaneurysm repair. Cultures grew: SA on reflexed ID panel. She was treated initially w/ IV abx, seen by vascular surg, wound vac placed and sent back to SNF on [facility]oxy. The patient at baseline has dementia, is bed bound, and poor historian, her EF is 35-40%. Admitted from the SNF w/ report of subjective fever, shortness of breath, cough and generalized body aches.  ER course:  Afib on tele, not febrile, uncooperative, LA < 2, CXR w/ new patchy infiltrates, given IV ABX and IVFs. Was to be admitted to tele.  While waiting for tele bed-->developed AF w/ RVR HR 150-170s. RR elevated, sats 80% on 2 liters, was combative and could not be placed on BIPAP. Intubated by EDP. PCCM asked to assess.   PAST MEDICAL HISTORY :  She  has a past medical history of Abdominal or pelvic swelling, mass, or lump, left upper quadrant; CHF (congestive heart failure) ([place]); Chronic atrial fibrillation ([place]); Chronic kidney disease; Congestive heart failure, unspecified; Coronary atherosclerosis of unspecified type of vessel, native or graft; Diabetes mellitus; Edema; GERD (gastroesophageal reflux disease); Gout, unspecified; Hypercholesterolemia; Hypertension; Legally blind (03/09/2014); Lymphoma ([place]); Peripheral vascular disease, unspecified ([place]); Seizures ([place]); Sinus of Valsalva aneurysm; Stroke [facility]); Unspecified hereditary and idiopathic peripheral neuropathy; Unspecified urinary incontinence; and Vascular dementia, uncomplicated.  PAST SURGICAL HISTORY: She  has a past surgical history that includes femoral to femoral bypass graft ;  Femoral artery debridement (12/06/2011); Femoral-femoral Bypass Graft (12/06/2011); False aneurysm repair (12/06/2011); Femoral-popliteal Bypass Graft (Left, 05/2002); Femoral-popliteal Bypass Graft (Left, 11/2002); Tubal ligation (Bilateral); Aortogram (09/01/2008); False aneurysm repair (Left, 08/30/2016); Femoral-femoral Bypass Graft (Left, 08/30/2016); Femoral-popliteal Bypass Graft (Left, 08/30/2016); Thrombectomy femoral artery (Left, 08/30/2016); Endarterectomy femoral (Left, 4/0/9811); and Application if wound vac (Left, 08/30/2016).  Allergies  Allergen Reactions  . Penicillins Other (See Comments)    Told by a doctor to "not take" and on MAR as an allergy Has patient had a PCN reaction causing immediate rash, facial/tongue/throat swelling, SOB or lightheadedness with hypotension: Unknown Has patient had a PCN reaction causing severe rash involving mucus membranes or skin necrosis: Unknown Has patient had a PCN reaction that required hospitalization: Unknown Has patient had a PCN reaction occurring within the last 10 years: Unknown If all of the above answers are "NO", then may proceed with Ceph    No current facility-administered medications on file prior to encounter.    Current Outpatient Prescriptions on File Prior to Encounter  Medication Sig  . acetaminophen (TYLENOL) 325 MG tablet Take 2 tablets (650 mg total) by mouth every 6 (six) hours as needed for mild pain (or Fever >/= 101).  Marland Kitchen allopurinol (ZYLOPRIM) 100 MG tablet Take 100 mg by mouth daily.  Marland Kitchen aspirin 81 MG tablet Take 81 mg by mouth daily.  Marland Kitchen atorvastatin (LIPITOR) 10 MG tablet Take 10 mg by mouth at bedtime.   . bacitracin (BACIGUENT) 500 UNIT/GM ointment Apply to Right Hallux topically every day shift for 10 days for Right Ingrown Toenail  . bisacodyl (BISCOLAX) 10 MG suppository Place 10 mg rectally as needed for moderate constipation.  . CETYLPYRIDINIUM CHLORIDE  MT Give 15 ml by mouth two times a day for oral irritation  .  donepezil (ARICEPT) 10 MG tablet Take 1 tablet (10 mg total) by mouth at bedtime.  Marland Kitchen doxycycline (VIBRA-TABS) 100 MG tablet Take 1 tablet (100 mg total) by mouth every 12 (twelve) hours.  Marland Kitchen guaiFENesin-dextromethorphan (ROBITUSSIN DM) 100-10 MG/5ML syrup Take 15 mLs by mouth every 4 (four) hours as needed for cough.  . levETIRAcetam (KEPPRA) 250 MG tablet Take 250 mg by mouth 2 (two) times daily.  . metoprolol tartrate (LOPRESSOR) 25 MG tablet Give 2 tablets (50mg ) by mouth in the morning and 1 tablet by mouth at bedtime  . mirtazapine (REMERON) 7.5 MG tablet Take 7.5 mg by mouth at bedtime.  Marland Kitchen oxyCODONE (OXY IR/ROXICODONE) 5 MG immediate release tablet Take 1 tablet (5 mg total) by mouth every 6 (six) hours as needed for severe pain.  . pantoprazole (PROTONIX) 40 MG tablet Take 40 mg by mouth daily.  . sertraline (ZOLOFT) 50 MG tablet Take 50 mg by mouth at bedtime.     FAMILY HISTORY:  Her indicated that her mother is deceased. She indicated that her father is deceased. She indicated that her sister is deceased. She indicated that only one of her three brothers is alive. She indicated that her daughter is alive. She indicated that her son is alive.    SOCIAL HISTORY: She  reports that she quit smoking about 15 years ago. Her smoking use included Cigarettes. She has never used smokeless tobacco. She reports that she does not drink alcohol or use drugs.  REVIEW OF SYSTEMS:   Not able to obtain as pt is sedated  SUBJECTIVE:  Remains on vent. No acute events overnight  VITAL SIGNS: BP 125/66   Pulse 66   Temp (!) 97.3 F (36.3 C) (Oral)   Resp 16   Ht 5\' 3"  (1.6 m)   Wt 68.3 kg (150 lb 9.2 oz)   SpO2 98%   BMI 26.67 kg/m   HEMODYNAMICS:    VENTILATOR SETTINGS: Vent Mode: PRVC FiO2 (%):  [30 %-40 %] 30 % Set Rate:  [16 bmp] 16 bmp Vt Set:  [500 mL] 500 mL PEEP:  [5 cmH20] 5 cmH20 Plateau Pressure:  [19 cmH20-22 cmH20] 20 cmH20  INTAKE / OUTPUT: I/O last 3 completed  shifts: In: 1091.2 [I.V.:551.2; NG/GT:290; IV Piggyback:250] Out: 525 [Urine:525]  PHYSICAL EXAMINATION: Gen:      No acute distress HEENT:  EOMI, sclera anicteric, ETT in place Neck:     No masses; no thyromegaly Lungs:    Clear to auscultation bilaterally; normal respiratory effort CV:         Regular rate and rhythm; no murmurs Abd:      + bowel sounds; soft, non-tender; no palpable masses, no distension Ext:    No edema; adequate peripheral perfusion Skin:      Warm and dry; no rash Neuro: alert and oriented x 3 Psych: normal mood and affect  LABS:  BMET  Recent Labs Lab 09/14/16 0535 09/14/16 0553 09/15/16 0504 09/16/16 0406  NA 142 142 142 139  K 4.6 4.4 4.0 3.9  CL 109 105 110 109  CO2 26  --  23 23  BUN 24* 24* 20 22*  CREATININE 1.31* 1.20* 1.16* 1.33*  GLUCOSE 180* 184* 178* 235*    Electrolytes  Recent Labs Lab 09/14/16 0535  09/15/16 0504 09/15/16 1558 09/16/16 0406  CALCIUM 9.7  --  9.0  --  9.1  MG  --   < >  1.1* 1.2* 1.2*  PHOS  --   < > 2.4* 2.7 3.0  < > = values in this interval not displayed.  CBC  Recent Labs Lab 09/14/16 1049 09/15/16 0716 09/16/16 0406  WBC 10.4 9.0 9.6  HGB 9.4* 9.4* 9.3*  HCT 29.1* 28.0* 27.8*  PLT 285 272 285    Coag's  Recent Labs Lab 09/15/16 1558  APTT 86*  INR 1.30    Sepsis Markers  Recent Labs Lab 09/14/16 0633 09/14/16 0840 09/14/16 1050 09/15/16 0504 09/16/16 0406  LATICACIDVEN 1.86 1.92*  --   --   --   PROCALCITON  --   --  0.10 0.33 0.20    ABG  Recent Labs Lab 09/14/16 0915  PHART 7.357  PCO2ART 39.6  PO2ART 357*    Liver Enzymes  Recent Labs Lab 09/14/16 0535 09/15/16 0504  AST 20 27  ALT 12* 14  ALKPHOS 82 69  BILITOT 0.4 0.8  ALBUMIN 3.2* 2.6*    Cardiac Enzymes  Recent Labs Lab 09/14/16 0549 09/14/16 1050 09/14/16 1423  TROPONINI 0.06* 0.07* 0.07*    Glucose  Recent Labs Lab 09/15/16 1603 09/15/16 2013 09/15/16 2325 09/16/16 0356  09/16/16 0746 09/16/16 1229  GLUCAP 170* 196* 190* 224* 221* 177*    Imaging Dg Chest Port 1 View  Result Date: 09/16/2016 CLINICAL DATA:  Acute respiratory failure EXAM: PORTABLE CHEST 1 VIEW COMPARISON:  Two days ago FINDINGS: Endotracheal tube tip between the clavicular heads and carina. An orogastric tube has been advanced to the stomach at least. Cardiomegaly. Partial visualization of the left diaphragm. Haziness of the lower chest is likely from atelectasis and pleural effusions. Atelectasis about the right minor fissure and patchy primarily perihilar opacity. Interstitial coarsening is improved. No pneumothorax. IMPRESSION: 1. Unremarkable positioning of tubes and central line. 2. Interstitial coarsening is improved, likely decreasing edema. 3. Patchy bilateral airspace opacity from residual airspace edema or pneumonia. Progressive atelectasis at the left base with volume loss. Electronically Signed   By: Monte Fantasia M.D.   On: 09/16/2016 07:02    STUDIES:    CULTURES: BCX 2 8/17>>>  ANTIBIOTICS: vanc 8/17>>> Zosyn 8/17>>8/17 (PCN allergy but has tolerated cephalosporins) cefepime 8/17>>>  SIGNIFICANT EVENTS:   LINES/TUBES: oett 8/17>>>  DISCUSSION: 76 year old with dementia, recent infected groin wound with staph, surgery on his stress some repair admitted with HCAP, a fib with RVR.  ASSESSMENT / PLAN:  PULMONARY A: Acute Hypoxic Respiratory failure in setting of progressive and diffuse airspace disease.  Pulm edema vs HCAP P:   Continue vent support Daily SBTs   CARDIOVASCULAR A:  Acute systolic HF (EF 93-23%) w/ pulmonary edema Severe PVD s/p recent repair of left femoral pseudoaneurysm  AF w/ RVR w/ h/o CAF. Now with sinus pauses  P:  Hold standing lopressor Stop precedex drip. Hold lasix as Cr is higher Heparin gtt  RENAL A:   Stage 3 CKD (baseline cr 1.28 to 1.5)  P:   Follow urine output and Cr  GASTROINTESTINAL A:   Severe protein  calorie malnutrition w/ cachexia  P:   PPI Tube feeds  HEMATOLOGIC A:   Anemia of chronic disease.  CBC shows acute hgb drop but repeat is stable.  No evidence of bleed P:  SCDs Monitor CBC  INFECTIOUS A:   Possible HCAP Recent left femoral wound infection  P:   BCX2 Sputum culture  Continue vanco, cefepime  ENDOCRINE A:   Diabetes w/ hyperglycemia  P:   SSI coverage  NEUROLOGIC A:   Severe dementia  Bed bound since stroke 3 years ago w/right sided weakness  P:   RASS goal: -1 Stop predecex Use fentanyl, versed PRN   FAMILY  - Updates: spoke to son Micheal over the phone 8/17. No family at bedside 8/9. Will need to discuss over all goal of care with family as she is demented at baseline.  - Inter-disciplinary family meet or Palliative Care meeting due by:  8/24  The patient is critically ill with multiple organ system failure and requires high complexity decision making for assessment and support, frequent evaluation and titration of therapies, advanced monitoring, review of radiographic studies and interpretation of complex data.   Critical Care Time devoted to patient care services, exclusive of separately billable procedures, described in this note is 35 minutes.   Marshell Garfinkel MD [facility] Pager [phone] If no answer or after 3pm call: [phone] 09/16/2016, 3:03 PM

## 2016-09-16 NOTE — Progress Notes (Signed)
ANTICOAGULATION CONSULT NOTE - Follow Up  Pharmacy Consult for Heparin Indication: atrial fibrillation  Allergies  Allergen Reactions  . Penicillins Other (See Comments)    Told by a doctor to "not take" and on MAR as an allergy Has patient had a PCN reaction causing immediate rash, facial/tongue/throat swelling, SOB or lightheadedness with hypotension: Unknown Has patient had a PCN reaction causing severe rash involving mucus membranes or skin necrosis: Unknown Has patient had a PCN reaction that required hospitalization: Unknown Has patient had a PCN reaction occurring within the last 10 years: Unknown If all of the above answers are "NO", then may proceed with Ceph    Patient Measurements: Height: 5' 3" (160 cm) Weight: 150 lb 9.2 oz (68.3 kg) IBW/kg (Calculated) : 52.4 Heparin Dosing Weight: 64.6 kg  Vital Signs: Temp: 99.2 F (37.3 C) (08/19 2013) Temp Source: Oral (08/19 2013) BP: 164/68 (08/19 2100) Pulse Rate: 71 (08/19 1647)  Labs:  Recent Labs  09/14/16 0549 09/14/16 0553 09/14/16 1049 09/14/16 1050 09/14/16 1423 09/15/16 0504 09/15/16 0716 09/15/16 1558 09/15/16 2224 09/16/16 0406 09/16/16 1017 09/16/16 2023  HGB  --  7.8* 9.4*  --   --   --  9.4*  --   --  9.3*  --   --   HCT  --  23.0* 29.1*  --   --   --  28.0*  --   --  27.8*  --   --   PLT  --   --  285  --   --   --  272  --   --  285  --   --   APTT  --   --   --   --   --   --   --  86*  --   --   --   --   LABPROT  --   --   --   --   --   --   --  16.3*  --   --   --   --   INR  --   --   --   --   --   --   --  1.30  --   --   --   --   HEPARINUNFRC  --   --   --   --   --   --   --   --  1.08*  --  0.84* 0.67  CREATININE  --  1.20*  --   --   --  1.16*  --   --   --  1.33*  --   --   TROPONINI 0.06*  --   --  0.07* 0.07*  --   --   --   --   --   --   --     Estimated Creatinine Clearance: 33.4 mL/min (A) (by C-G formula based on SCr of 1.33 mg/dL (H)).    Medications:  Scheduled:   . allopurinol  100 mg Per Tube Daily  . aspirin  81 mg Per Tube Daily  . atorvastatin  10 mg Per Tube QHS  . bacitracin  1 application Topical BID  . chlorhexidine gluconate (MEDLINE KIT)  15 mL Mouth Rinse BID  . donepezil  10 mg Per Tube QHS  . feeding supplement (VITAL HIGH PROTEIN)  1,000 mL Per Tube Q24H  . insulin aspart  0-15 Units Subcutaneous Q4H  . lactobacillus  1 g Per Tube TID WC  .   levETIRAcetam  250 mg Per Tube BID  . mouth rinse  15 mL Mouth Rinse QID  . pantoprazole (PROTONIX) IV  40 mg Intravenous Daily  . sertraline  50 mg Per Tube QHS   Infusions:  . ceFEPime (MAXIPIME) IV Stopped (09/16/16 1240)  . heparin 700 Units/hr (09/16/16 2100)  . vancomycin Stopped (09/16/16 0742)   PRN: acetaminophen, albuterol, bisacodyl, fentaNYL (SUBLIMAZE) injection, fentaNYL (SUBLIMAZE) injection, ipratropium-albuterol, metoprolol tartrate, midazolam, midazolam  Assessment: 76 yo female with dementia, hx CVA, seizures, bedbound, recent repair of left femoral pseduo aneurysm complictaed with infection at the surgical site treated with doxycycline now admitted with subjective fever cough shortness of breath and generalized body aches. On tele, found to be in Afib with RVR and started on cardizem drip which was stopped due to hypotension.  Pharmacy is consulted to dose IV heparin for anticoagulation for afib.  Today, 09/16/2016  Heparin level now therapeutic but borderline high on 700 units/hr  CBC: Hgb low but stable, Plts wnl  In AM, small amount of blood noted when she wiped the perineum area. In PM, RN thinking this related to skin tears rather than internal bleeding, and no longer an issue.  Goal of Therapy:  Heparin level 0.3-0.7 units/ml Monitor platelets by anticoagulation protocol: Yes   Plan:   Decrease heparin IV infusion to 600 units/hr   Recheck heparin level in AM  Daily heparin level, CBC  Reuel Boom, PharmD, BCPS Pager: [phone] 09/16/2016, 9:20  PM

## 2016-09-16 NOTE — Progress Notes (Signed)
ANTICOAGULATION CONSULT NOTE - Follow Up  Pharmacy Consult for Heparin Indication: atrial fibrillation  Allergies  Allergen Reactions  . Penicillins Other (See Comments)    Told by a doctor to "not take" and on MAR as an allergy Has patient had a PCN reaction causing immediate rash, facial/tongue/throat swelling, SOB or lightheadedness with hypotension: Unknown Has patient had a PCN reaction causing severe rash involving mucus membranes or skin necrosis: Unknown Has patient had a PCN reaction that required hospitalization: Unknown Has patient had a PCN reaction occurring within the last 10 years: Unknown If all of the above answers are "NO", then may proceed with Ceph    Patient Measurements: Height: '5\' 3"'  (160 cm) Weight: 150 lb 9.2 oz (68.3 kg) IBW/kg (Calculated) : 52.4 Heparin Dosing Weight: 64.6 kg  Vital Signs: Temp: 97.4 F (36.3 C) (08/19 0800) Temp Source: Oral (08/19 0800) BP: 121/59 (08/19 1000) Pulse Rate: 66 (08/19 1111)  Labs:  Recent Labs  09/14/16 0549 09/14/16 0553 09/14/16 1049 09/14/16 1050 09/14/16 1423 09/15/16 0504 09/15/16 0716 09/15/16 1558 09/15/16 2224 09/16/16 0406 09/16/16 1017  HGB  --  7.8* 9.4*  --   --   --  9.4*  --   --  9.3*  --   HCT  --  23.0* 29.1*  --   --   --  28.0*  --   --  27.8*  --   PLT  --   --  285  --   --   --  272  --   --  285  --   APTT  --   --   --   --   --   --   --  86*  --   --   --   LABPROT  --   --   --   --   --   --   --  16.3*  --   --   --   INR  --   --   --   --   --   --   --  1.30  --   --   --   HEPARINUNFRC  --   --   --   --   --   --   --   --  1.08*  --  0.84*  CREATININE  --  1.20*  --   --   --  1.16*  --   --   --  1.33*  --   TROPONINI 0.06*  --   --  0.07* 0.07*  --   --   --   --   --   --     Estimated Creatinine Clearance: 33.4 mL/min (A) (by C-G formula based on SCr of 1.33 mg/dL (H)).   Medical History: Past Medical History:  Diagnosis Date  . Abdominal or pelvic swelling,  mass, or lump, left upper quadrant   . CHF (congestive heart failure) ([place])   . Chronic atrial fibrillation ([place])   . Chronic kidney disease   . Congestive heart failure, unspecified   . Coronary atherosclerosis of unspecified type of vessel, native or graft   . Diabetes mellitus   . Edema   . GERD (gastroesophageal reflux disease)   . Gout, unspecified   . Hypercholesterolemia   . Hypertension   . 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  . Peripheral vascular disease, unspecified ([place])   . Seizures ([place])   . Sinus of Valsalva aneurysm    a. By 2D echo 05/2011.  . Stroke ([place])   . Unspecified hereditary and idiopathic peripheral neuropathy   . Unspecified urinary incontinence   . Vascular dementia, uncomplicated     Medications:  Scheduled:  . allopurinol  100 mg Per Tube Daily  . aspirin  81 mg Per Tube Daily  . atorvastatin  10 mg Per Tube QHS  . bacitracin  1 application Topical BID  . chlorhexidine gluconate (MEDLINE KIT)  15 mL Mouth Rinse BID  . donepezil  10 mg Per Tube QHS  . feeding supplement (VITAL HIGH PROTEIN)  1,000 mL Per Tube Q24H  . insulin aspart  0-15 Units Subcutaneous Q4H  . lactobacillus  1 g Per Tube TID WC  . levETIRAcetam  250 mg Per Tube BID  . mouth rinse  15 mL Mouth Rinse QID  . metoprolol tartrate  25 mg Per Tube QHS  . pantoprazole (PROTONIX) IV  40 mg Intravenous Daily  . sertraline  50 mg Per Tube QHS   Infusions:  . ceFEPime (MAXIPIME) IV Stopped (09/15/16 1200)  . dexmedetomidine (PRECEDEX) IV infusion 0.7 mcg/kg/hr (09/16/16 1000)  . heparin    . vancomycin Stopped (09/16/16 0742)   PRN: acetaminophen, albuterol, bisacodyl, fentaNYL (SUBLIMAZE) injection, fentaNYL (SUBLIMAZE) injection, ipratropium-albuterol, metoprolol tartrate  Assessment: 76 yo female with dementia, hx CVA,  seizures, bedbound, recent repair of left femoral pseduo aneurysm complictaed with infection at the surgical site treated with doxycycline now admitted with subjective fever cough shortness of breath and generalized body aches. On tele, found to be in Afib with RVR and started on cardizem drip which was stopped due to hypotension.  Pharmacy is consulted to dose IV heparin for anticoagulation for afib.  Today, 09/16/2016  Heparin level 0.84, supratherapeutic on 850 units/hr  CBC: Hgb low but stable, Plts wnl  Per discussion with RN, small amount of blood noted when she wiped the perineum area. Will monitor this closely and notify CCM if worsens.  Goal of Therapy:  Heparin level 0.3-0.7 units/ml Monitor platelets by anticoagulation protocol: Yes   Plan:   Decrease heparin IV infusion 700 units/hr   Recheck heparin level 8hr rate decreased  Daily heparin level, CBC  Peggyann Juba, PharmD, BCPS Pager: [phone] 09/16/2016,11:56 AM

## 2016-09-17 ENCOUNTER — Inpatient Hospital Stay (HOSPITAL_COMMUNITY): Payer: Medicare Other

## 2016-09-17 LAB — GLUCOSE, CAPILLARY
GLUCOSE-CAPILLARY: 163 mg/dL — AB (ref 65–99)
GLUCOSE-CAPILLARY: 170 mg/dL — AB (ref 65–99)
Glucose-Capillary: 171 mg/dL — ABNORMAL HIGH (ref 65–99)
Glucose-Capillary: 160 mg/dL — ABNORMAL HIGH (ref 65–99)
Glucose-Capillary: 169 mg/dL — ABNORMAL HIGH (ref 65–99)
Glucose-Capillary: 149 mg/dL — ABNORMAL HIGH (ref 65–99)

## 2016-09-17 LAB — CBC
HCT: 30 % — ABNORMAL LOW (ref 36.0–46.0)
Hemoglobin: 10 g/dL — ABNORMAL LOW (ref 12.0–15.0)
MCH: 27.2 pg (ref 26.0–34.0)
MCHC: 33.3 g/dL (ref 30.0–36.0)
MCV: 81.5 fL (ref 78.0–100.0)
PLATELETS: 295 10*3/uL (ref 150–400)
RBC: 3.68 MIL/uL — AB (ref 3.87–5.11)
RDW: 16.4 % — ABNORMAL HIGH (ref 11.5–15.5)
WBC: 10.5 10*3/uL (ref 4.0–10.5)

## 2016-09-17 LAB — BASIC METABOLIC PANEL
ANION GAP: 9 (ref 5–15)
BUN: 27 mg/dL — AB (ref 6–20)
CALCIUM: 9.1 mg/dL (ref 8.9–10.3)
CHLORIDE: 107 mmol/L (ref 101–111)
CO2: 21 mmol/L — AB (ref 22–32)
CREATININE: 1.21 mg/dL — AB (ref 0.44–1.00)
GFR calc Af Amer: 49 mL/min — ABNORMAL LOW (ref >60)
GFR calc non Af Amer: 42 mL/min — ABNORMAL LOW (ref >60)
GLUCOSE: 186 mg/dL — AB (ref 65–99)
Potassium: 3.8 mmol/L (ref 3.5–5.1)
SODIUM: 137 mmol/L (ref 135–145)

## 2016-09-17 LAB — URINE CULTURE: Culture: NO GROWTH

## 2016-09-17 LAB — MAGNESIUM: Magnesium: 1.1 mg/dL — ABNORMAL LOW (ref 1.7–2.4)

## 2016-09-17 LAB — PHOSPHORUS: Phosphorus: 3.4 mg/dL (ref 2.5–4.6)

## 2016-09-17 LAB — HEPARIN LEVEL (UNFRACTIONATED): Heparin Unfractionated: <0.1 IU/mL — ABNORMAL LOW (ref 0.30–0.70)

## 2016-09-17 MED ORDER — VITAL AF 1.2 CAL PO LIQD
1000.0000 mL | ORAL | Status: DC
  Administered 2016-09-17: 1000 mL
  Filled 2016-09-17 (×2): qty 1000

## 2016-09-17 MED ORDER — HEPARIN (PORCINE) IN NACL 100-0.45 UNIT/ML-% IJ SOLN: 800.0000 [IU]/h | INTRAMUSCULAR | Status: DC

## 2016-09-17 MED ORDER — PANTOPRAZOLE SODIUM 40 MG PO PACK
40.0000 mg | PACK | ORAL | Status: DC
  Administered 2016-09-17 – 2016-09-23 (×7): 40 mg
  Filled 2016-09-17 (×16): qty 20

## 2016-09-17 MED ORDER — FUROSEMIDE 10 MG/ML IJ SOLN
40.0000 mg | Freq: Once | INTRAMUSCULAR | Status: AC
  Administered 2016-09-17: 40 mg via INTRAVENOUS
  Filled 2016-09-17: qty 4

## 2016-09-17 MED ORDER — FREE WATER
50.0000 mL | Freq: Four times a day (QID) | Status: DC
  Administered 2016-09-17 – 2016-09-18 (×3): 50 mL

## 2016-09-17 MED ORDER — ACETAMINOPHEN 160 MG/5ML PO SOLN
650.0000 mg | Freq: Four times a day (QID) | ORAL | Status: DC | PRN
  Administered 2016-09-19: 650 mg
  Filled 2016-09-17: qty 20.3

## 2016-09-17 NOTE — Progress Notes (Signed)
ANTICOAGULATION CONSULT NOTE - Follow Up  Pharmacy Consult for Heparin Indication: atrial fibrillation  Allergies  Allergen Reactions  . Penicillins Other (See Comments)    Told by a doctor to "not take" and on MAR as an allergy Has patient had a PCN reaction causing immediate rash, facial/tongue/throat swelling, SOB or lightheadedness with hypotension: Unknown Has patient had a PCN reaction causing severe rash involving mucus membranes or skin necrosis: Unknown Has patient had a PCN reaction that required hospitalization: Unknown Has patient had a PCN reaction occurring within the last 10 years: Unknown If all of the above answers are "NO", then may proceed with Ceph    Patient Measurements: Height: 5' 3" (160 cm) Weight: 146 lb 2.6 oz (66.3 kg) IBW/kg (Calculated) : 52.4 Heparin Dosing Weight: 64.6 kg  Vital Signs: Temp: 98.6 F (37 C) (08/20 1313) Temp Source: Oral (08/20 0329) BP: 175/108 (08/20 1400)  Labs:  Recent Labs  09/15/16 0504  09/15/16 0716 09/15/16 1558  09/16/16 0406  09/16/16 2023 09/17/16 0337 09/17/16 1340  HGB  --   < > 9.4*  --   --  9.3*  --   --  10.0*  --   HCT  --   --  28.0*  --   --  27.8*  --   --  30.0*  --   PLT  --   --  272  --   --  285  --   --  295  --   APTT  --   --   --  86*  --   --   --   --   --   --   LABPROT  --   --   --  16.3*  --   --   --   --   --   --   INR  --   --   --  1.30  --   --   --   --   --   --   HEPARINUNFRC  --   --   --   --   < >  --   < > 0.67 <0.10* <0.10*  CREATININE 1.16*  --   --   --   --  1.33*  --   --  1.21*  --   < > = values in this interval not displayed.  Estimated Creatinine Clearance: 36.2 mL/min (A) (by C-G formula based on SCr of 1.21 mg/dL (H)).    Medications:  Scheduled:  . allopurinol  100 mg Per Tube Daily  . aspirin  81 mg Per Tube Daily  . atorvastatin  10 mg Per Tube QHS  . bacitracin  1 application Topical BID  . chlorhexidine gluconate (MEDLINE KIT)  15 mL Mouth Rinse  BID  . donepezil  10 mg Per Tube QHS  . free water  50 mL Per Tube Q6H  . insulin aspart  0-15 Units Subcutaneous Q4H  . lactobacillus  1 g Per Tube TID WC  . levETIRAcetam  250 mg Per Tube BID  . mouth rinse  15 mL Mouth Rinse QID  . pantoprazole sodium  40 mg Per Tube Q24H  . sertraline  50 mg Per Tube QHS   Infusions:  . ceFEPime (MAXIPIME) IV Stopped (09/17/16 1155)  . feeding supplement (VITAL AF 1.2 CAL)    . heparin 600 Units/hr (09/17/16 0440)   PRN: acetaminophen, albuterol, bisacodyl, fentaNYL (SUBLIMAZE) injection, ipratropium-albuterol, metoprolol tartrate, midazolam  Assessment: 76 yo   female with dementia, hx CVA, seizures, bedbound, recent repair of left femoral pseduo aneurysm complictaed with infection at the surgical site treated with doxycycline now admitted with subjective fever cough shortness of breath and generalized body aches. On tele, found to be in Afib with RVR and started on cardizem drip which was stopped due to hypotension.  Pharmacy is consulted to dose IV heparin for anticoagulation for afib.  Today, 09/17/2016  Heparin level this AM is less than 0.10  on 700 units/hr  During the night the heparin drip was held for about 1.5 hours and the level was drawn during this time period. Therefore the level does not reflect anticoagulation. It is unclear why the drip was held.   1340 HL is less than 0.10. Per RN no bleeding or line issues  CBC: Hgb low but stable, Plts wnl   Goal of Therapy:  Heparin level 0.3-0.7 units/ml Monitor platelets by anticoagulation protocol: Yes   Plan:     Increase heparin IV infusion at 800 units/hr   Recheck heparin level 8 hours after rate change   Daily heparin level, CBC    , PharmD, BCPS Pager [phone] 09/17/2016 2:42 PM   

## 2016-09-17 NOTE — Care Management Note (Signed)
Case Management Note  Patient Details  Name: UNA YEOMANS MRN: [id number] Date of Birth: May 24, 1940  Subjective/Objective:     Remains on full vent support               Action/Plan: Date:  September 17, 2016 Chart reviewed for concurrent status and case management needs. Will continue to follow patient progress. Discharge Planning: following for needs Expected discharge date: [id number] Velva Harman, BSN, [place], [place]  Expected Discharge Date:   (UNKNOWN)               Expected Discharge Plan:  [place]  In-House Referral:  Clinical Social Work  Discharge planning Services  CM Consult  Post Acute Care Choice:    Choice offered to:     DME Arranged:    DME Agency:     HH Arranged:    [place] Agency:     Status of Service:  In process, will continue to follow  If discussed at Long Length of Stay Meetings, dates discussed:    Additional Comments:  [name], RN 09/17/2016, 8:53 AM

## 2016-09-17 NOTE — Progress Notes (Signed)
Nutrition Follow-up  DOCUMENTATION CODES:   Severe malnutrition in context of acute illness/injury  INTERVENTION:  - Will change TF regimen: Vital AF 1.2 @ 45 mL/hr which will provide 1296 kcal (97% re-estimated kcal need), 81 grams of protein, and 876 mL free water. - Will order 50 mL free water QID (200 mL/day).   NUTRITION DIAGNOSIS:   Malnutrition (severe) related to acute illness, wound healing (abdominal wound) as evidenced by moderate depletion of body fat, severe depletion of muscle mass. -ongoing  GOAL:   Patient will meet greater than or equal to 90% of their needs -met with TF regimen  MONITOR:   Vent status, Labs, Weight trends, TF tolerance, I & O's  ASSESSMENT:   76 year old woman with dementia, bedbound for 3 years, recent admission to [facility] for infected groin wound with staph following pseudoaneurysm repair admitted 8/17 with fevers, shortness of breath and cough, chest x-ray showing multifocal bilateral patchy infiltrates and atrial fibrillation/RVR. She was hypoxic requiring 2 L. She was started on Cardizem drip and became hypotensive and hence this was stopped. Developed respiratory distress and was emergently intubated in the ED  8/20 Pt remains intubated with OGT in place. Weight fluctuations have occurred since admission and admission weight of 64.1 kg used in re-estimating nutrition needs today. Pt currently receiving Vital High Protein @ 50 mL/hr via OGT. This regimen is providing 1200 kcal (90% re-estimated kcal need), 105 grams of protein (109% maximum re-estimated protein need), and 1003 mL free water. Will adjust TF regimen as outlined above to more appropriately meet nutrition needs.   Patient is currently intubated on ventilator support MV: 8.3 L/min Temp (24hrs), Avg:98.3 F (36.8 C), Min:96.5 F (35.8 C), Max:99.2 F (37.3 C) Propofol: none BP: 162/54 and MAP: 77  Medications reviewed; 40 mg IV Lasix x1 dose today, sliding scale Novolog, 1 g  lactobacillus TID per OGT, 40 mg Protonix per OGT/day.  Labs reviewed; CBGs: 171, 160, and 169 mg/dL today, BUN: 27 mg/dL, creatinine: 1.21 mg/dL, Mg: 1.1 mg/dL, GFR: 49 mL/min.   Drip: Heparin @ 600 units/hr.    8/17 - Patient in room with RN at bedside. No family present.  - RN alerted RD that the patient has stated in previous goals of care discussions that she would like everything but a feeding tube.  - Clarification needed if tube feeding at all is appropriate given goals of care.  - Family had reported pt has had weight loss over the past year.  - Nutrition-Focused physical exam completed.  - Findings are moderate fat depletion, severe muscle depletion, and no edema.   Patient is currently intubated on ventilator support MV: 8.2 L/min Temp (24hrs), Avg:97.5 F (36.4 C), Min:97.5 F (36.4 C), Max:97.5 F (36.4 C)   Diet Order:  Diet NPO time specified  Skin:   (abdominal and groin incisions)  Last BM:  8/20  Height:   Ht Readings from Last 1 Encounters:  09/14/16 _0  (1.6 m)    Weight:   Wt Readings from Last 1 Encounters:  09/17/16 146 lb 2.6 oz (66.3 kg)    Ideal Body Weight:  52.3 kg  BMI:  Body mass index is 25.89 kg/m.  Estimated Nutritional Needs:   Kcal:  1336  Protein:  77-96 grams (1.2-1.5 grams/kg)  Fluid:  1.4L/day  EDUCATION NEEDS:   No education needs identified at this time    Jarome Matin, MS, RD, LDN, CNSC Inpatient Clinical Dietitian Pager # [phone] After hours/weekend pager # [phone]

## 2016-09-17 NOTE — Progress Notes (Signed)
afib rvr max HR 175

## 2016-09-17 NOTE — Progress Notes (Signed)
ANTICOAGULATION CONSULT NOTE - Follow Up  Pharmacy Consult for Heparin Indication: atrial fibrillation  Allergies  Allergen Reactions  . Penicillins Other (See Comments)    Told by a doctor to "not take" and on MAR as an allergy Has patient had a PCN reaction causing immediate rash, facial/tongue/throat swelling, SOB or lightheadedness with hypotension: Unknown Has patient had a PCN reaction causing severe rash involving mucus membranes or skin necrosis: Unknown Has patient had a PCN reaction that required hospitalization: Unknown Has patient had a PCN reaction occurring within the last 10 years: Unknown If all of the above answers are "NO", then may proceed with Ceph    Patient Measurements: Height: _0  (160 cm) Weight: 146 lb 2.6 oz (66.3 kg) IBW/kg (Calculated) : 52.4 Heparin Dosing Weight: 64.6 kg  Vital Signs: Temp: 98.4 F (36.9 C) (08/20 0329) Temp Source: Oral (08/20 0329) BP: 146/76 (08/20 0700)  Labs:  Recent Labs  09/14/16 1050 09/14/16 1423 09/15/16 0504 09/15/16 0716 09/15/16 1558  09/16/16 0406 09/16/16 1017 09/16/16 2023 09/17/16 0337  HGB  --   --   --  9.4*  --   --  9.3*  --   --  10.0*  HCT  --   --   --  28.0*  --   --  27.8*  --   --  30.0*  PLT  --   --   --  272  --   --  285  --   --  295  APTT  --   --   --   --  86*  --   --   --   --   --   LABPROT  --   --   --   --  16.3*  --   --   --   --   --   INR  --   --   --   --  1.30  --   --   --   --   --   HEPARINUNFRC  --   --   --   --   --   < >  --  0.84* 0.67 <0.10*  CREATININE  --   --  1.16*  --   --   --  1.33*  --   --  1.21*  TROPONINI 0.07* 0.07*  --   --   --   --   --   --   --   --   < > = values in this interval not displayed.  Estimated Creatinine Clearance: 36.2 mL/min (A) (by C-G formula based on SCr of 1.21 mg/dL (H)).    Medications:  Scheduled:  . allopurinol  100 mg Per Tube Daily  . aspirin  81 mg Per Tube Daily  . atorvastatin  10 mg Per Tube QHS  .  bacitracin  1 application Topical BID  . chlorhexidine gluconate (MEDLINE KIT)  15 mL Mouth Rinse BID  . donepezil  10 mg Per Tube QHS  . feeding supplement (VITAL HIGH PROTEIN)  1,000 mL Per Tube Q24H  . insulin aspart  0-15 Units Subcutaneous Q4H  . lactobacillus  1 g Per Tube TID WC  . levETIRAcetam  250 mg Per Tube BID  . mouth rinse  15 mL Mouth Rinse QID  . pantoprazole (PROTONIX) IV  40 mg Intravenous Daily  . sertraline  50 mg Per Tube QHS   Infusions:  . ceFEPime (MAXIPIME) IV Stopped (09/16/16 1240)  .  heparin 600 Units/hr (09/17/16 0440)  . vancomycin Stopped (09/17/16 0731)   PRN: acetaminophen, albuterol, bisacodyl, fentaNYL (SUBLIMAZE) injection, fentaNYL (SUBLIMAZE) injection, ipratropium-albuterol, metoprolol tartrate, midazolam, midazolam  Assessment: 76 yo female with dementia, hx CVA, seizures, bedbound, recent repair of left femoral pseduo aneurysm complictaed with infection at the surgical site treated with doxycycline now admitted with subjective fever cough shortness of breath and generalized body aches. On tele, found to be in Afib with RVR and started on cardizem drip which was stopped due to hypotension.  Pharmacy is consulted to dose IV heparin for anticoagulation for afib.  Today, 09/17/2016  Heparin level this AM is less than 0.10  on 700 units/hr  During the night the heparin drip was held for about 1.5 hours and the level was drawn during this time period. Therefore the level does not reflect anticoagulation. It is unclear why the drip was held.   CBC: Hgb low but stable, Plts wnl  No bleeding issues noted per RN   Goal of Therapy:  Heparin level 0.3-0.7 units/ml Monitor platelets by anticoagulation protocol: Yes   Plan:   Continue heparin IV infusion at 600 units/hr   Recheck heparin level 8 hours after restart at 1300  Daily heparin level, CBC   Royetta Asal, PharmD, BCPS Pager [phone] 09/17/2016 7:56 AM

## 2016-09-17 NOTE — Progress Notes (Signed)
Pharmacy Antibiotic Note  Casey [name] is a 76 y.o. female admitted on 09/14/2016 with pneumonia. She is also currently on Doxycycline for femoral wound which Vancomycin will cover.   Pharmacy has been consulted for Vancomycin dosing.   Zosyn started in ED, but PCN allergy.  Discussed with Marni Griffon, NP and will change to Cefepime (she has tolerated cephalosporins in the past).  09/17/2016:  Intubated  Afebrile  LA elevated & trending up 1.86-->1.92 ( 8/17)   Renal function at baseline- est CrCl ~ 36 ml/min  Plan: Vancomycin 1g IV every 24 hours.  Goal trough 15-20 mcg/mL.  Cefepime 2gm IV q24h Monitor renal function and cx data  Obtain trough prior to dose due at 0600 on 8/21  Height: 5\' 3"  (160 cm) Weight: 146 lb 2.6 oz (66.3 kg) IBW/kg (Calculated) : 52.4  Temp (24hrs), Avg:98 F (36.7 C), Min:96.5 F (35.8 C), Max:99.2 F (37.3 C)   Recent Labs Lab 09/14/16 0535 09/14/16 0553 09/14/16 0321 09/14/16 0840 09/14/16 1049 09/15/16 0504 09/15/16 0716 09/16/16 0406 09/17/16 0337  WBC 7.0  --   --   --  10.4  --  9.0 9.6 10.5  CREATININE 1.31* 1.20*  --   --   --  1.16*  --  1.33* 1.21*  LATICACIDVEN  --   --  1.86 1.92*  --   --   --   --   --     Estimated Creatinine Clearance: 36.2 mL/min (A) (by C-G formula based on SCr of 1.21 mg/dL (H)).    Allergies  Allergen Reactions  . Penicillins Other (See Comments)    Told by a doctor to "not take" and on MAR as an allergy Has patient had a PCN reaction causing immediate rash, facial/tongue/throat swelling, SOB or lightheadedness with hypotension: Unknown Has patient had a PCN reaction causing severe rash involving mucus membranes or skin necrosis: Unknown Has patient had a PCN reaction that required hospitalization: Unknown Has patient had a PCN reaction occurring within the last 10 years: Unknown If all of the above answers are "NO", then may proceed with Ceph    Antimicrobials this admission: 8/17 Vanc >>   8/17 Zosyn x1 dose in ED 8/17 Cefepime>> 8/12 Doxy>>8/17 (prescribed x 7 days)  Dose adjustments this admission: 8/21 0600 VT: ______  Microbiology results: 8/17 BCx: sent 8/17 MRSA PCR: neg 8/17 Trach asp: no organisms seen, cx pending, NGx2d 8/19 UCx: sent  Thank you for allowing pharmacy to be a part of this patient's care.   Royetta Asal, PharmD, BCPS Pager [phone] 09/17/2016 8:06 AM

## 2016-09-17 NOTE — Progress Notes (Signed)
PULMONARY / CRITICAL CARE MEDICINE   Name: Casey [name] MRN: [id number] DOB: 1940/09/10    ADMISSION DATE:  09/14/2016 CONSULTATION DATE:  8/17  REFERRING MD:  Rodena Piety   CHIEF COMPLAINT:  Acute respiratory failure   HISTORY OF PRESENT ILLNESS:   76 yo female d/c from [facility] on 09/09/16 after tx for wound infection s/p Lt femoral pseudoaneurysm repair and had Staph aureus in blood culture.  She was d/c to SNF with wound vac and doxycycline.  She has hx of dementia and is bed bound at baseline for past several years.  She also has hx of systolic CHF.  She presented with fever, dyspnea, cough, and body aches.  She was found to have new pulmonary infiltrate with concern for HCAP, A fib with RVR and required intubation.  SUBJECTIVE:  Denies chest or abdominal pain.  VITAL SIGNS: BP (!) 146/76   Pulse 71   Temp 98.3 F (36.8 C)   Resp 16   Ht 5\' 3"  (1.6 m)   Wt 146 lb 2.6 oz (66.3 kg)   SpO2 100%   BMI 25.89 kg/m   VENTILATOR SETTINGS: Vent Mode: PSV FiO2 (%):  [30 %] 30 % Set Rate:  [16 bmp] 16 bmp Vt Set:  [500 mL] 500 mL PEEP:  [5 cmH20] 5 cmH20 Pressure Support:  [12 cmH20] 12 cmH20 Plateau Pressure:  [17 cmH20-21 cmH20] 17 cmH20  INTAKE / OUTPUT: I/O last 3 completed shifts: In: 1430.7 [I.V.:390.7; NG/GT:790; IV Piggyback:250] Out: 445 [Urine:445]  PHYSICAL EXAMINATION:  General - alert Eyes - pupils reactive ENT - ETT in place Cardiac - regular, no murmur Chest - no wheeze, rales Abd - soft, non tender Ext - no edema Skin - no rashes Neuro - follows simple commands  LABS:  BMET  Recent Labs Lab 09/15/16 0504 09/16/16 0406 09/17/16 0337  NA 142 139 137  K 4.0 3.9 3.8  CL 110 109 107  CO2 23 23 21*  BUN 20 22* 27*  CREATININE 1.16* 1.33* 1.21*  GLUCOSE 178* 235* 186*    Electrolytes  Recent Labs Lab 09/15/16 0504 09/15/16 1558 09/16/16 0406 09/17/16 0337  CALCIUM 9.0  --  9.1 9.1  MG 1.1* 1.2* 1.2* 1.1*  PHOS 2.4* 2.7 3.0 3.4     CBC  Recent Labs Lab 09/15/16 0716 09/16/16 0406 09/17/16 0337  WBC 9.0 9.6 10.5  HGB 9.4* 9.3* 10.0*  HCT 28.0* 27.8* 30.0*  PLT 272 285 295    Coag's  Recent Labs Lab 09/15/16 1558  APTT 86*  INR 1.30    Sepsis Markers  Recent Labs Lab 09/14/16 0633 09/14/16 0840 09/14/16 1050 09/15/16 0504 09/16/16 0406  LATICACIDVEN 1.86 1.92*  --   --   --   PROCALCITON  --   --  0.10 0.33 0.20    ABG  Recent Labs Lab 09/14/16 0915  PHART 7.357  PCO2ART 39.6  PO2ART 357*    Liver Enzymes  Recent Labs Lab 09/14/16 0535 09/15/16 0504  AST 20 27  ALT 12* 14  ALKPHOS 82 69  BILITOT 0.4 0.8  ALBUMIN 3.2* 2.6*    Cardiac Enzymes  Recent Labs Lab 09/14/16 0549 09/14/16 1050 09/14/16 1423  TROPONINI 0.06* 0.07* 0.07*    Glucose  Recent Labs Lab 09/16/16 1229 09/16/16 1514 09/16/16 2009 09/16/16 2317 09/17/16 0327 09/17/16 0754  GLUCAP 177* 184* 104* 137* 171* 160*    Imaging Dg Chest Port 1 View  Result Date: 09/17/2016 CLINICAL DATA:  Respiratory failure. EXAM:  PORTABLE CHEST 1 VIEW COMPARISON:  09/16/2016. FINDINGS: Endotracheal tube tip 1.4 cm above the carina. Retraction of approximately 2 cm should be considered. NG tube noted in stable position. Cardiomegaly with diffuse mild bilateral interstitial prominence and small bilateral pleural effusions suggesting CHF again noted. Similar findings noted on prior exam . Improved basilar atelectasis. No pneumothorax. IMPRESSION: 1. Endotracheal tube tip 1.4 cm above the carina. Retraction of approximately 2 cm should be considered. NG tube noted stable position. 2. Cardiomegaly with diffuse mild bilateral interstitial prominence and small bilateral pleural effusions suggesting CHF again noted. Similar findings noted on prior exam. 3. Improved bibasilar atelectasis. Electronically Signed   By: Marcello Moores  Register   On: 09/17/2016 06:48    STUDIES:  Echo 8/11 >> EF 25 to 30%, mild AR, mild MR, PAS 53  mmHg  CULTURES: Sputum 8/17 >>  Blood 8/17 >>   ANTIBIOTICS: vanc 8/17>>>8/20 cefepime 8/17>>>  SIGNIFICANT EVENTS: 8/17 Admit 8/20 Pressure support weaning trial  LINES/TUBES: ETT 8/17>>>  DISCUSSION: 76 year old with dementia, recent infected groin wound with staph, surgery on his stress some repair admitted with HCAP, a fib with RVR.  ASSESSMENT / PLAN:  PULMONARY A: Acute hypoxic respiratory failure 2nd to combination of HCAP and acute pulmonary edema. P:   Pressure support wean as tolerated  CARDIOVASCULAR A:  Acute on chronic systolic CHF. Severe PAD s/p repair of Lt femoral pseudoaneurysm. A fib with RVR >> now in sinus rhythm. Sinus pauses 2nd to meds >> improved. Hx of HLD. P:  Hold lopressors Continue heparin gtt Add lasix  Continue lipitor, ASA  RENAL A:   Stage 3 CKD (baseline cr 1.28 to 1.5). P:   Monitor renal fx, urine outpt  GASTROINTESTINAL A:   Severe protein calorie malnutrition w/ cachexia.  P:   Tube feeds while on vent  HEMATOLOGIC A:   Anemia of chronic disease.  P:  F/u CBC  INFECTIOUS A:   HCAP. P:   Day 4 of Abx, continue cefepime and d/c vancomycin  ENDOCRINE A:   Diabetes w/ hyperglycemia. Hx of Gout. P:   SSI Allopurinol  NEUROLOGIC A:   Acute metabolic encephalopathy. Hx of dementia, seizures, CVA with Rt sided weakness >> bed bound.  P:   RASS goal 0 to -1 Continue aricept, keppra, zoloft  CC time 31 minutes  Chesley Mires, MD [place] 09/17/2016, 9:05 AM Pager:  [phone] After 3pm call: [phone]

## 2016-09-18 ENCOUNTER — Inpatient Hospital Stay (HOSPITAL_COMMUNITY): Payer: Medicare Other

## 2016-09-18 LAB — BASIC METABOLIC PANEL
ANION GAP: 10 (ref 5–15)
ANION GAP: 6 (ref 5–15)
BUN: 25 mg/dL — AB (ref 6–20)
BUN: 24 mg/dL — ABNORMAL HIGH (ref 6–20)
CALCIUM: 9.4 mg/dL (ref 8.9–10.3)
CHLORIDE: 110 mmol/L (ref 101–111)
CO2: 21 mmol/L — AB (ref 22–32)
CO2: 23 mmol/L (ref 22–32)
CREATININE: 1.31 mg/dL — AB (ref 0.44–1.00)
Calcium: 8.8 mg/dL — ABNORMAL LOW (ref 8.9–10.3)
Chloride: 117 mmol/L — ABNORMAL HIGH (ref 101–111)
Creatinine, Ser: 1.37 mg/dL — ABNORMAL HIGH (ref 0.44–1.00)
GFR calc Af Amer: 42 mL/min — ABNORMAL LOW (ref >60)
GFR calc Af Amer: 45 mL/min — ABNORMAL LOW (ref >60)
GFR calc non Af Amer: 36 mL/min — ABNORMAL LOW (ref >60)
GFR calc non Af Amer: 38 mL/min — ABNORMAL LOW (ref >60)
GLUCOSE: 150 mg/dL — AB (ref 65–99)
GLUCOSE: 242 mg/dL — AB (ref 65–99)
POTASSIUM: 2.8 mmol/L — AB (ref 3.5–5.1)
Potassium: 5.2 mmol/L — ABNORMAL HIGH (ref 3.5–5.1)
Sodium: 141 mmol/L (ref 135–145)
Sodium: 146 mmol/L — ABNORMAL HIGH (ref 135–145)

## 2016-09-18 LAB — CBC
HEMATOCRIT: 25.7 % — AB (ref 36.0–46.0)
HEMOGLOBIN: 8.7 g/dL — AB (ref 12.0–15.0)
MCH: 27.4 pg (ref 26.0–34.0)
MCHC: 33.9 g/dL (ref 30.0–36.0)
MCV: 81.1 fL (ref 78.0–100.0)
Platelets: 271 10*3/uL (ref 150–400)
RBC: 3.17 MIL/uL — AB (ref 3.87–5.11)
RDW: 16.7 % — ABNORMAL HIGH (ref 11.5–15.5)
WBC: 7.9 10*3/uL (ref 4.0–10.5)

## 2016-09-18 LAB — HEPARIN LEVEL (UNFRACTIONATED)
HEPARIN UNFRACTIONATED: 0.46 [IU]/mL (ref 0.30–0.70)
Heparin Unfractionated: <0.1 IU/mL — ABNORMAL LOW (ref 0.30–0.70)
Heparin Unfractionated: 1.1 IU/mL — ABNORMAL HIGH (ref 0.30–0.70)

## 2016-09-18 LAB — GLUCOSE, CAPILLARY
GLUCOSE-CAPILLARY: 183 mg/dL — AB (ref 65–99)
GLUCOSE-CAPILLARY: 191 mg/dL — AB (ref 65–99)
GLUCOSE-CAPILLARY: 117 mg/dL — AB (ref 65–99)
GLUCOSE-CAPILLARY: 227 mg/dL — AB (ref 65–99)
GLUCOSE-CAPILLARY: 171 mg/dL — AB (ref 65–99)
Glucose-Capillary: 214 mg/dL — ABNORMAL HIGH (ref 65–99)

## 2016-09-18 LAB — MAGNESIUM
Magnesium: 1.1 mg/dL — ABNORMAL LOW (ref 1.7–2.4)
Magnesium: 1.6 mg/dL — ABNORMAL LOW (ref 1.7–2.4)

## 2016-09-18 MED ORDER — SODIUM CHLORIDE 0.9% FLUSH
10.0000 mL | INTRAVENOUS | Status: DC | PRN
  Administered 2016-09-25 – 2016-09-27 (×3): 10 mL
  Administered 2016-09-30: 20 mL
  Filled 2016-09-18 (×4): qty 40

## 2016-09-18 MED ORDER — MAGNESIUM SULFATE 2 GM/50ML IV SOLN
2.0000 g | Freq: Once | INTRAVENOUS | Status: AC
  Administered 2016-09-18: 2 g via INTRAVENOUS
  Filled 2016-09-18: qty 50

## 2016-09-18 MED ORDER — SODIUM CHLORIDE 0.9% FLUSH
10.0000 mL | Freq: Two times a day (BID) | INTRAVENOUS | Status: DC
  Administered 2016-09-18 (×2): 10 mL
  Administered 2016-09-19: 20 mL
  Administered 2016-09-19 – 2016-09-20 (×2): 10 mL
  Administered 2016-09-20: 20 mL
  Administered 2016-09-21 – 2016-10-05 (×14): 10 mL

## 2016-09-18 MED ORDER — FREE WATER
100.0000 mL | Status: DC
  Administered 2016-09-18 – 2016-09-23 (×30): 100 mL

## 2016-09-18 MED ORDER — GLYCOPYRROLATE 1 MG PO TABS
1.0000 mg | ORAL_TABLET | Freq: Two times a day (BID) | ORAL | Status: DC | PRN
  Administered 2016-09-18: 1 mg
  Filled 2016-09-18: qty 1

## 2016-09-18 MED ORDER — PRO-STAT SUGAR FREE PO LIQD
30.0000 mL | Freq: Three times a day (TID) | ORAL | Status: DC
  Administered 2016-09-18 – 2016-09-19 (×3): 30 mL
  Filled 2016-09-18 (×3): qty 30

## 2016-09-18 MED ORDER — HEPARIN (PORCINE) IN NACL 100-0.45 UNIT/ML-% IJ SOLN
1000.0000 [IU]/h | INTRAMUSCULAR | Status: DC
  Administered 2016-09-18: 1000 [IU]/h via INTRAVENOUS
  Filled 2016-09-18: qty 250

## 2016-09-18 MED ORDER — WARFARIN SODIUM 4 MG PO TABS
4.0000 mg | ORAL_TABLET | Freq: Once | ORAL | Status: AC
  Administered 2016-09-18: 4 mg via ORAL
  Filled 2016-09-18 (×2): qty 1

## 2016-09-18 MED ORDER — HEPARIN (PORCINE) IN NACL 100-0.45 UNIT/ML-% IJ SOLN: 850.0000 [IU]/h | INTRAMUSCULAR | Status: DC

## 2016-09-18 MED ORDER — POTASSIUM CHLORIDE 10 MEQ/100ML IV SOLN
10.0000 meq | INTRAVENOUS | Status: DC
  Administered 2016-09-18: 10 meq via INTRAVENOUS
  Filled 2016-09-18: qty 100

## 2016-09-18 MED ORDER — METOPROLOL TARTRATE 25 MG/10 ML ORAL SUSPENSION
25.0000 mg | Freq: Two times a day (BID) | ORAL | Status: DC
  Administered 2016-09-18 (×2): 25 mg
  Filled 2016-09-18 (×3): qty 10

## 2016-09-18 MED ORDER — WARFARIN - PHARMACIST DOSING INPATIENT
Freq: Every day | Status: DC
  Administered 2016-09-19 – 2016-09-23 (×2)

## 2016-09-18 MED ORDER — CHLORHEXIDINE GLUCONATE CLOTH 2 % EX PADS
6.0000 | MEDICATED_PAD | Freq: Every day | CUTANEOUS | Status: DC
  Administered 2016-09-19 – 2016-09-22 (×4): 6 via TOPICAL

## 2016-09-18 MED ORDER — JEVITY 1.2 CAL PO LIQD
1000.0000 mL | ORAL | Status: DC
  Administered 2016-09-18: 1000 mL

## 2016-09-18 MED ORDER — POTASSIUM CHLORIDE 20 MEQ/15ML (10%) PO SOLN
40.0000 meq | ORAL | Status: AC
  Administered 2016-09-18 (×3): 40 meq
  Filled 2016-09-18 (×3): qty 30

## 2016-09-18 MED ORDER — FUROSEMIDE 10 MG/ML PO SOLN
20.0000 mg | Freq: Every day | ORAL | Status: DC
  Administered 2016-09-18 – 2016-09-20 (×3): 20 mg
  Filled 2016-09-18 (×3): qty 2

## 2016-09-18 MED ORDER — LOPERAMIDE HCL 1 MG/5ML PO LIQD
4.0000 mg | Freq: Once | ORAL | Status: AC
  Administered 2016-09-18: 4 mg
  Filled 2016-09-18 (×2): qty 20

## 2016-09-18 NOTE — Progress Notes (Signed)
eLink Physician-Brief Progress Note Patient Name: Casey [name] DOB: [birth year] MRN: [id number]   Date of Service  09/18/2016  HPI/Events of Note  Loose stools NO fever NO abdo pain Changes in TF WBC normal   eICU Interventions  imodium x 1 Monitor for above symptoms prior to any cdiff  testing     Intervention Category Minor Interventions: Clinical assessment - ordering diagnostic tests  Raylene Miyamoto. 09/18/2016, 4:18 PM

## 2016-09-18 NOTE — Progress Notes (Signed)
PULMONARY / CRITICAL CARE MEDICINE   Name: Casey [name] MRN: [id number] DOB: [birth year]    ADMISSION DATE:  09/14/2016 CONSULTATION DATE:  8/17  REFERRING MD:  Rodena Piety   CHIEF COMPLAINT:  Acute respiratory failure   HISTORY OF PRESENT ILLNESS:   76 yo female d/c from [facility] on 09/09/16 after tx for wound infection s/p Lt femoral pseudoaneurysm repair and had Staph aureus in blood culture.  She was d/c to SNF with wound vac and doxycycline.  She has hx of dementia and is bed bound at baseline for past several years.  She also has hx of systolic CHF.  She presented with fever, dyspnea, cough, and body aches.  She was found to have new pulmonary infiltrate with concern for HCAP, A fib with RVR and required intubation.  SUBJECTIVE:  Remains on full vent support.  Reports burning in arm from KCL.  Difficulty with maintaining peripheral IVs.  VITAL SIGNS: BP (!) 143/73   Pulse (!) 112   Temp (!) 100.6 F (38.1 C) (Oral)   Resp 16   Ht 5\' 3"  (1.6 m)   Wt 146 lb 9.7 oz (66.5 kg)   SpO2 100%   BMI 25.97 kg/m   VENTILATOR SETTINGS: Vent Mode: PRVC FiO2 (%):  [30 %] 30 % Set Rate:  [16 bmp] 16 bmp Vt Set:  [500 mL] 500 mL PEEP:  [5 cmH20] 5 cmH20 Pressure Support:  [12 cmH20] 12 cmH20 Plateau Pressure:  [16 cmH20-20 cmH20] 20 cmH20  INTAKE / OUTPUT: I/O last 3 completed shifts: In: 2239.9 [I.V.:451.6; NG/GT:1388.3; IV Piggyback:400] Out: 2485 [Urine:2485]  PHYSICAL EXAMINATION:  General - pleasant Eyes - pupils reactive ENT - ETT in place Cardiac - regular, no murmur Chest - no wheeze, rales Abd - soft, non tender Ext - no edema. Lt groin wound site clean Skin - no rashes Neuro - follows simple commands   LABS:  BMET  Recent Labs Lab 09/16/16 0406 09/17/16 0337 09/18/16 0215  NA 139 137 141  K 3.9 3.8 2.8*  CL 109 107 110  CO2 23 21* 21*  BUN 22* 27* 25*  CREATININE 1.33* 1.21* 1.37*  GLUCOSE 235* 186* 150*    Electrolytes  Recent Labs Lab  09/15/16 1558 09/16/16 0406 09/17/16 0337 09/18/16 0215  CALCIUM  --  9.1 9.1 8.8*  MG 1.2* 1.2* 1.1* 1.1*  PHOS 2.7 3.0 3.4  --     CBC  Recent Labs Lab 09/16/16 0406 09/17/16 0337 09/18/16 0215  WBC 9.6 10.5 7.9  HGB 9.3* 10.0* 8.7*  HCT 27.8* 30.0* 25.7*  PLT 285 295 271    Coag's  Recent Labs Lab 09/15/16 1558  APTT 86*  INR 1.30    Sepsis Markers  Recent Labs Lab 09/14/16 0633 09/14/16 0840 09/14/16 1050 09/15/16 0504 09/16/16 0406  LATICACIDVEN 1.86 1.92*  --   --   --   PROCALCITON  --   --  0.10 0.33 0.20    ABG  Recent Labs Lab 09/14/16 0915  PHART 7.357  PCO2ART 39.6  PO2ART 357*    Liver Enzymes  Recent Labs Lab 09/14/16 0535 09/15/16 0504  AST 20 27  ALT 12* 14  ALKPHOS 82 69  BILITOT 0.4 0.8  ALBUMIN 3.2* 2.6*    Cardiac Enzymes  Recent Labs Lab 09/14/16 0549 09/14/16 1050 09/14/16 1423  TROPONINI 0.06* 0.07* 0.07*    Glucose  Recent Labs Lab 09/17/16 1227 09/17/16 1545 09/17/16 1920 09/17/16 2309 09/18/16 0324 09/18/16 0808  GLUCAP 169*  163* 149* 170* 183* 191*    Imaging Dg Chest Port 1 View  Result Date: 09/18/2016 CLINICAL DATA:  Respiratory failure, history of lymphoma, CHF, peripheral vascular disease, current smoker. EXAM: PORTABLE CHEST 1 VIEW COMPARISON:  Portable chest x-ray of September 17, 2016 FINDINGS: The trachea remains intubated with the tip of the tube lying 2.5 cm above the carina. The esophagogastric tube tip projects below the inferior margin of the image. The lungs are well-expanded. The lung markings are coarse in the lower lobes. A small right pleural effusion likely layers posteriorly. The cardiac silhouette remains enlarged. The pulmonary vascularity is minimally prominent. There is calcification in the wall of the thoracic aorta. IMPRESSION: COPD. Probable subsegmental atelectasis or infiltrate in the lower lobes without discrete air bronchograms. Reasonable positioning of the  endotracheal tube. Mild cardiomegaly with mild pulmonary vascular congestion. Thoracic aortic atherosclerosis. Electronically Signed   By: David  Martinique M.D.   On: 09/18/2016 07:09    STUDIES:  Echo 8/11 >> EF 25 to 30%, mild AR, mild MR, PAS 53 mmHg  CULTURES: Sputum 8/17 >> negative Blood 8/17 >>  Urine 8/19 >> negative  ANTIBIOTICS: vanc 8/17>>>8/20 cefepime 8/17>>>  SIGNIFICANT EVENTS: 8/17 Admit 8/20 Pressure support weaning trial  LINES/TUBES: ETT 8/17 >>  DISCUSSION: 76 year old with dementia, recent infected groin wound with staph, surgery on his stress some repair admitted with HCAP, a fib with RVR.  ASSESSMENT / PLAN:  PULMONARY A: Acute hypoxic respiratory failure 2nd to combination of HCAP and acute pulmonary edema. P:   Pressure support wean as tolerated >> not ready for extubation yet  CARDIOVASCULAR A:  Acute on chronic systolic CHF. Severe PAD s/p repair of Lt femoral pseudoaneurysm. A fib with RVR >> now in sinus rhythm. Sinus pauses 2nd to meds >> resolved. Hx of HLD. Difficult IV access. P:  Resume lopressor 25 mg bid Continue heparin gtt and have pharmacy start coumadin after PICC line placed Continue lipitor, ASA, lasix Will have PICC line placed D/c Lt groin staples  RENAL A:   Stage 3 CKD (baseline cr 1.28 to 1.5). Hypokalemia, hypomagnesemia. P:   Monitor renal fx, urine outpt F/u electrolytes  GASTROINTESTINAL A:   Severe protein calorie malnutrition w/ cachexia.  P:   Tube feeds while on vent  HEMATOLOGIC A:   Anemia of chronic disease.  P:  F/u CBC  INFECTIOUS A:   HCAP. P:   Day 5 of cefepime  ENDOCRINE A:   Diabetes w/ hyperglycemia. Hx of Gout. P:   SSI Allopurinol  NEUROLOGIC A:   Acute metabolic encephalopathy. Hx of dementia, seizures, CVA with Rt sided weakness >> bed bound.  P:   RASS goal 0 to -1 Continue aricept, keppra, zoloft  CC time 32 minutes  Chesley Mires, MD [place] 09/18/2016, 8:16 AM Pager:  [phone] After 3pm call: [phone]

## 2016-09-18 NOTE — Progress Notes (Signed)
CSW consulted to assist with dc planning. Pt is a LTC resident from [facility]. Pt is oriented  to self only and unable to participate in dc planning. Please see psychosocial assessment dated 08/31/16. PN reviewed. This is pt's 3rd admission this month. Pt's daughter contacted Lupita Leash [phone] ) to confirm dc plan. Daughter voiced  concerns regarding the multiple hospital admission and questioned whether pt was dc too soon. Daughter reported that she has been pleased with care pt received at [facility] and would like her to return at dc. CSW will send clinicals to SNF and continue to follow to assist with  dc planning.  Werner Lean LCSW [phone]

## 2016-09-18 NOTE — Progress Notes (Signed)
Peripherally Inserted Central Catheter/Midline Placement  The IV Nurse has discussed with the patient and/or persons authorized to consent for the patient, the purpose of this procedure and the potential benefits and risks involved with this procedure.  The benefits include less needle sticks, lab draws from the catheter, and the patient may be discharged home with the catheter. Risks include, but not limited to, infection, bleeding, blood clot (thrombus formation), and puncture of an artery; nerve damage and irregular heartbeat and possibility to perform a PICC exchange if needed/ordered by physician.  Alternatives to this procedure were also discussed.  Bard Power PICC patient education guide, fact sheet on infection prevention and patient information card has been provided to patient /or left at bedside.    PICC/Midline Placement Documentation        Henderson [place] 09/18/2016, 11:52 AM Phone consent obtained from daughter Lupita Leash

## 2016-09-18 NOTE — Progress Notes (Signed)
Brief pharmacy anticoagulation note:  For full details see note from [facility]. From today  Assessment and plan: Heparin level supratherapeutic at 1.1 (goal 0.3-0.7 units/ml) No bleeding per RN Hold heparin drip for 1 hour then restart at 850 units/ hr Recheck heparin in am  Dolly Rias RPh 09/18/2016, 9:50 PM Pager [phone]

## 2016-09-18 NOTE — Progress Notes (Signed)
ANTICOAGULATION CONSULT NOTE - Follow Up Consult  Pharmacy Consult for Heparin, Warfarin Indication: atrial fibrillation  Allergies  Allergen Reactions  . Penicillins Other (See Comments)    Told by a doctor to "not take" and on MAR as an allergy Has patient had a PCN reaction causing immediate rash, facial/tongue/throat swelling, SOB or lightheadedness with hypotension: Unknown Has patient had a PCN reaction causing severe rash involving mucus membranes or skin necrosis: Unknown Has patient had a PCN reaction that required hospitalization: Unknown Has patient had a PCN reaction occurring within the last 10 years: Unknown If all of the above answers are "NO", then may proceed with Ceph    Patient Measurements: Height: 5\' 3"  (160 cm) Weight: 146 lb 9.7 oz (66.5 kg) IBW/kg (Calculated) : 52.4 Heparin Dosing Weight: 64 kg  Vital Signs: Temp: 100.6 F (38.1 C) (08/21 0707) Temp Source: Oral (08/21 0707) BP: 143/73 (08/21 0600) Pulse Rate: 112 (08/21 0445)  Labs:  Recent Labs  09/15/16 1558  09/16/16 0406  09/17/16 0337 09/17/16 1340 09/18/16 0215  HGB  --   < > 9.3*  --  10.0*  --  8.7*  HCT  --   --  27.8*  --  30.0*  --  25.7*  PLT  --   --  285  --  295  --  271  APTT 86*  --   --   --   --   --   --   LABPROT 16.3*  --   --   --   --   --   --   INR 1.30  --   --   --   --   --   --   HEPARINUNFRC  --   < >  --   < > <0.10* <0.10* <0.10*  CREATININE  --   --  1.33*  --  1.21*  --  1.37*  < > = values in this interval not displayed.  Estimated Creatinine Clearance: 32 mL/min (A) (by C-G formula based on SCr of 1.37 mg/dL (H)).   Medications:  Infusions:  . ceFEPime (MAXIPIME) IV Stopped (09/17/16 1155)  . feeding supplement (VITAL AF 1.2 CAL) 1,000 mL (09/18/16 0600)  . heparin 1,000 Units/hr (09/18/16 0600)    Assessment: 76 yo female with hx of Afib (NO anticoagulation PTA), dementia, CVA, seizures, bedbound, recent repair of left femoral pseduo aneurysm  complictaed with infection at the surgical site treated with doxycycline now admitted with subjective fever cough shortness of breath and generalized body aches. On tele, found to be in Afib with RVR and started on cardizem drip which was stopped due to hypotension.  Pharmacy is consulted to dose IV heparin and warfarin for anticoagulation for afib.  Today, 09/18/2016:  Heparin level 0.46, therapeutic  RN reports heparin infusion was off for an unspecified amount of time this morning.  When she arrived the arm IV had infiltrated (pt has history of very difficult IV access) and the heparin was restarted in left hand IV line.  A PICC line has now been placed.  Hgb decreased to 8.7, anemia of chronic disease noted by MD  No bleeding or complications reported.  Groin site of left fem to fem bypass (08/30/16) does not have any bleeding after staple removal on 8/21.  Baseline INR 1.3 (8/18)  Goal of Therapy:  Heparin level 0.3-0.7 units/ml Monitor platelets by anticoagulation protocol: Yes   Plan:   Warfarin 4 mg PO today x1 dose at 1400  Continue heparin IV infusion at 1000 units/hr  Heparin level in 8 hours to confirm therapeutic level  Daily heparin level, INR, and CBC  Continue to monitor H&H and platelets  Patient will need a minimum of 5 days warfarin / Heparin overlap and until INR > 2 for 24 hours.   Provide warfarin education to family/caretakers if possible.  Pt is not appropriate for education at this time d/t dementia.   Gretta Arab PharmD, BCPS Pager [phone] 09/18/2016 8:26 AM

## 2016-09-18 NOTE — Progress Notes (Signed)
Pharmacy: Re- heparin  Patient's a 76 y.o F on heparin for afib.  Heparin level now back undetectable despite rate increased to 800 units/hr earlier today. Per RN, No issue with IV line.  Plan: - increase heparin drip 1000 units/hr - check 8 hr heparin level - monitor for s/s bleeding  Dia Sitter, PharmD, BCPS 09/18/2016 12:35 AM

## 2016-09-18 NOTE — Progress Notes (Addendum)
NUTRITION NOTE  Patient discussed during rounds this AM. RN concerned about pt with several bouts of diarrhea yesterday and one this AM. She is currently receiving Vital AF 1.2 @ 45 mL/hr with 50 mL free water QID which is providing 1296 kcal (97% estimated kcal need), 81 grams of protein, 1076 mL free water, and 5.5 grams of fiber.   Will trial switching to Jevity 1.2 @ 35 mL/hr with 30 mL Prostat TID and 100 mL free water every 4 hours. This regimen will provide 1308 kcal, 92 grams of protein, 1278 mL free water, and 15 grams of fiber.  Medications include 2 g IV Maxipime/day. PICC being inserted at this time.   Estimated Nutritional Needs from 8/20:  Kcal:  1336 Protein:  77-96 grams (1.2-1.5 grams/kg) Fluid:  1.4L/day    Jarome Matin, MS, RD, LDN, CNSC Inpatient Clinical Dietitian Pager # [phone] After hours/weekend pager # [phone]

## 2016-09-18 NOTE — Progress Notes (Signed)
eLink Physician-Brief Progress Note Patient Name: ZYAIRA VEJAR DOB: [birth year] MRN: [id number]   Date of Service  09/18/2016  HPI/Events of Note  K+ = 2.8, Mg++ = 1.1 and Creatinine = 1.37.   eICU Interventions  Will replace K+ and Mg++.     Intervention Category Major Interventions: Electrolyte abnormality - evaluation and management  Sommer,Steven Eugene 09/18/2016, 6:24 AM

## 2016-09-18 NOTE — Progress Notes (Signed)
CRITICAL VALUE ALERT  Critical Value:  K+ 2.8  Date & Time Notied: 0600  Provider Notified: [id number]   Orders Received/Actions taken: K+ replacement

## 2016-09-19 ENCOUNTER — Inpatient Hospital Stay (HOSPITAL_COMMUNITY): Payer: Medicare Other

## 2016-09-19 LAB — GLUCOSE, CAPILLARY
GLUCOSE-CAPILLARY: 149 mg/dL — AB (ref 65–99)
GLUCOSE-CAPILLARY: 197 mg/dL — AB (ref 65–99)
Glucose-Capillary: 170 mg/dL — ABNORMAL HIGH (ref 65–99)
Glucose-Capillary: 185 mg/dL — ABNORMAL HIGH (ref 65–99)
Glucose-Capillary: 213 mg/dL — ABNORMAL HIGH (ref 65–99)
Glucose-Capillary: 125 mg/dL — ABNORMAL HIGH (ref 65–99)

## 2016-09-19 LAB — BASIC METABOLIC PANEL
ANION GAP: 5 (ref 5–15)
BUN: 26 mg/dL — AB (ref 6–20)
CALCIUM: 9.4 mg/dL (ref 8.9–10.3)
CO2: 23 mmol/L (ref 22–32)
Chloride: 117 mmol/L — ABNORMAL HIGH (ref 101–111)
Creatinine, Ser: 1.34 mg/dL — ABNORMAL HIGH (ref 0.44–1.00)
GFR calc Af Amer: 43 mL/min — ABNORMAL LOW (ref >60)
GFR, EST NON AFRICAN AMERICAN: 37 mL/min — AB (ref >60)
GLUCOSE: 149 mg/dL — AB (ref 65–99)
Potassium: 4.1 mmol/L (ref 3.5–5.1)
SODIUM: 145 mmol/L (ref 135–145)

## 2016-09-19 LAB — CBC
HEMATOCRIT: 24.6 % — AB (ref 36.0–46.0)
Hemoglobin: 8.1 g/dL — ABNORMAL LOW (ref 12.0–15.0)
MCH: 27.4 pg (ref 26.0–34.0)
MCHC: 32.9 g/dL (ref 30.0–36.0)
MCV: 83.1 fL (ref 78.0–100.0)
Platelets: 251 10*3/uL (ref 150–400)
RBC: 2.96 MIL/uL — AB (ref 3.87–5.11)
RDW: 17.3 % — ABNORMAL HIGH (ref 11.5–15.5)
WBC: 10.3 10*3/uL (ref 4.0–10.5)

## 2016-09-19 LAB — PROTIME-INR
INR: 1.36
Prothrombin Time: 16.9 seconds — ABNORMAL HIGH (ref 11.4–15.2)

## 2016-09-19 LAB — CULTURE, BLOOD (ROUTINE X 2)
CULTURE: NO GROWTH
Culture: NO GROWTH
SPECIAL REQUESTS: ADEQUATE
SPECIAL REQUESTS: ADEQUATE

## 2016-09-19 LAB — MAGNESIUM: MAGNESIUM: 1.6 mg/dL — AB (ref 1.7–2.4)

## 2016-09-19 LAB — HEPARIN LEVEL (UNFRACTIONATED)
Heparin Unfractionated: 1.01 IU/mL — ABNORMAL HIGH (ref 0.30–0.70)
Heparin Unfractionated: 0.89 IU/mL — ABNORMAL HIGH (ref 0.30–0.70)

## 2016-09-19 LAB — PHOSPHORUS: Phosphorus: 1.9 mg/dL — ABNORMAL LOW (ref 2.5–4.6)

## 2016-09-19 MED ORDER — HEPARIN (PORCINE) IN NACL 100-0.45 UNIT/ML-% IJ SOLN
650.0000 [IU]/h | INTRAMUSCULAR | Status: DC
  Administered 2016-09-19: 650 [IU]/h via INTRAVENOUS
  Filled 2016-09-19: qty 250

## 2016-09-19 MED ORDER — JEVITY 1.2 CAL PO LIQD
1000.0000 mL | ORAL | Status: DC
  Administered 2016-09-19 – 2016-09-24 (×5): 1000 mL
  Filled 2016-09-19 (×4): qty 1000

## 2016-09-19 MED ORDER — METOPROLOL TARTRATE 25 MG/10 ML ORAL SUSPENSION
50.0000 mg | Freq: Two times a day (BID) | ORAL | Status: DC
  Administered 2016-09-19 – 2016-09-20 (×4): 50 mg
  Filled 2016-09-19 (×4): qty 20

## 2016-09-19 MED ORDER — WARFARIN SODIUM 4 MG PO TABS
4.0000 mg | ORAL_TABLET | Freq: Once | ORAL | Status: AC
  Administered 2016-09-19: 4 mg via ORAL
  Filled 2016-09-19: qty 1

## 2016-09-19 MED ORDER — MAGNESIUM SULFATE 2 GM/50ML IV SOLN
2.0000 g | Freq: Once | INTRAVENOUS | Status: AC
  Administered 2016-09-19: 2 g via INTRAVENOUS
  Filled 2016-09-19: qty 50

## 2016-09-19 MED ORDER — JEVITY 1.2 CAL PO LIQD: 1000.0000 mL | ORAL | Status: DC

## 2016-09-19 MED ORDER — HEPARIN (PORCINE) IN NACL 100-0.45 UNIT/ML-% IJ SOLN: 550.0000 [IU]/h | INTRAMUSCULAR | Status: DC

## 2016-09-19 MED ORDER — PRO-STAT SUGAR FREE PO LIQD
30.0000 mL | Freq: Two times a day (BID) | ORAL | Status: DC
  Administered 2016-09-19 – 2016-09-22 (×7): 30 mL
  Filled 2016-09-19 (×7): qty 30

## 2016-09-19 NOTE — Progress Notes (Signed)
ANTICOAGULATION CONSULT NOTE - Follow Up Consult  Pharmacy Consult for Heparin, Warfarin Indication: atrial fibrillation  Allergies  Allergen Reactions  . Penicillins Other (See Comments)    Told by a doctor to "not take" and on MAR as an allergy Has patient had a PCN reaction causing immediate rash, facial/tongue/throat swelling, SOB or lightheadedness with hypotension: Unknown Has patient had a PCN reaction causing severe rash involving mucus membranes or skin necrosis: Unknown Has patient had a PCN reaction that required hospitalization: Unknown Has patient had a PCN reaction occurring within the last 10 years: Unknown If all of the above answers are "NO", then may proceed with Ceph    Patient Measurements: Height: 5\' 3"  (160 cm) Weight: 142 lb 3.2 oz (64.5 kg) IBW/kg (Calculated) : 52.4 Heparin Dosing Weight: 64 kg  Vital Signs: Temp: 98.1 F (36.7 C) (08/22 0800) Temp Source: Oral (08/22 0800) BP: 129/54 (08/22 0600) Pulse Rate: 132 (08/22 0417)  Labs:  Recent Labs  09/17/16 0337  09/18/16 0215 09/18/16 1155 09/18/16 2021 09/19/16 0235  HGB 10.0*  --  8.7*  --   --   --   HCT 30.0*  --  25.7*  --   --   --   PLT 295  --  271  --   --   --   LABPROT  --   --   --   --   --  16.9*  INR  --   --   --   --   --  1.36  HEPARINUNFRC <0.10*  < > <0.10* 0.46 1.10* 1.01*  CREATININE 1.21*  --  1.37*  --  1.31* 1.34*  < > = values in this interval not displayed.  Estimated Creatinine Clearance: 32.3 mL/min (A) (by C-G formula based on SCr of 1.34 mg/dL (H)).   Medications:  Infusions:  . ceFEPime (MAXIPIME) IV Stopped (09/19/16 0737)  . heparin 650 Units/hr (09/19/16 0732)    Assessment: 75 yo female with hx of Afib (NO anticoagulation PTA), dementia, CVA, seizures, bedbound, recent repair of left femoral pseduo aneurysm complictaed with infection at the surgical site treated with doxycycline now admitted with subjective fever cough shortness of breath and  generalized body aches. On tele, found to be in Afib with RVR and started on cardizem drip which was stopped due to hypotension.  Pharmacy is consulted to dose IV heparin and warfarin for anticoagulation for afib.  Today, 09/19/2016:  Heparin level was supratherapeutic at 1.01 on decreased rate of 850 units/hr, heparin was held x1 hour then resumed at lower rate of 650 units/hr.  INR 1.36 is subtherapeutic as expected after only 1 warfarin dose  CBC: Hgb decreased to 8.1, Plt remain WNL.  No active bleeding is noted.  RN reported saliva having a light pink color this morning.  No known oral trauma.  Drug-drug intxn: broad spectrum abx can make pt more sensitive to warfarin  Extubated, started on tube feeds d/t severe malnutrition w/ cachexia.   Goal of Therapy:  INR 2-3 Heparin level 0.3-0.7 units/ml Monitor platelets by anticoagulation protocol: Yes   Plan:   Continue Heparin IV infusion at 650 units/hr per previous orders.  Check 8 hr heparin level  Daily CBC, HL, INR  Warfarin 4 mg PO today x1 dose at 1800  Monitor for s/s bleeding Patient will need a minimum of 5 days warfarin / Heparin overlap and until INR > 2 for 24 hours.   Gretta Arab PharmD, BCPS Pager [phone] 09/19/2016 9:05  AM

## 2016-09-19 NOTE — Progress Notes (Addendum)
PULMONARY / CRITICAL CARE MEDICINE   Name: Casey [name] MRN: [id number] DOB: 06-Jan-1941    ADMISSION DATE:  09/14/2016 CONSULTATION DATE:  8/17  REFERRING MD:  Rodena Piety   CHIEF COMPLAINT:  Acute respiratory failure   HISTORY OF PRESENT ILLNESS:   76 yo female d/c from [facility] on 09/09/16 after tx for wound infection s/p Lt femoral pseudoaneurysm repair and had Staph aureus in blood culture.  She was d/c to SNF with wound vac and doxycycline.  She has hx of dementia and is bed bound at baseline for past several years.  She also has hx of systolic CHF.  She presented with fever, dyspnea, cough, and body aches.  She was found to have new pulmonary infiltrate with concern for HCAP, A fib with RVR and required intubation.  SUBJECTIVE:  Tolerating pressure support.  VITAL SIGNS: BP (!) 129/54   Pulse (!) 132   Temp 98.1 F (36.7 C) (Oral)   Resp 16   Ht 5\' 3"  (1.6 m)   Wt 142 lb 3.2 oz (64.5 kg)   SpO2 100%   BMI 25.19 kg/m   VENTILATOR SETTINGS: Vent Mode: PRVC FiO2 (%):  [30 %] 30 % Set Rate:  [16 bmp] 16 bmp Vt Set:  [500 mL] 500 mL PEEP:  [5 cmH20] 5 cmH20 Plateau Pressure:  [14 cmH20-16 cmH20] 15 cmH20  INTAKE / OUTPUT: I/O last 3 completed shifts: In: 2057 [I.V.:267.1; NG/GT:1589.8; IV Piggyback:200] Out: 1125 [Urine:1125]  PHYSICAL EXAMINATION:  General - alert Eyes - pupils reactive ENT - ETT in place Cardiac - irregular, no murmur Chest - no wheeze, rales Abd - soft, non tender Ext - no edema Skin - no rashes Neuro - follows simple commands   LABS:  BMET  Recent Labs Lab 09/18/16 0215 09/18/16 2021 09/19/16 0235  NA 141 146* 145  K 2.8* 5.2* 4.1  CL 110 117* 117*  CO2 21* 23 23  BUN 25* 24* 26*  CREATININE 1.37* 1.31* 1.34*  GLUCOSE 150* 242* 149*    Electrolytes  Recent Labs Lab 09/16/16 0406 09/17/16 0337 09/18/16 0215 09/18/16 2021 09/19/16 0235  CALCIUM 9.1 9.1 8.8* 9.4 9.4  MG 1.2* 1.1* 1.1* 1.6* 1.6*  PHOS 3.0 3.4  --   --   1.9*    CBC  Recent Labs Lab 09/16/16 0406 09/17/16 0337 09/18/16 0215  WBC 9.6 10.5 7.9  HGB 9.3* 10.0* 8.7*  HCT 27.8* 30.0* 25.7*  PLT 285 295 271    Coag's  Recent Labs Lab 09/15/16 1558 09/19/16 0235  APTT 86*  --   INR 1.30 1.36    Sepsis Markers  Recent Labs Lab 09/14/16 0633 09/14/16 0840 09/14/16 1050 09/15/16 0504 09/16/16 0406  LATICACIDVEN 1.86 1.92*  --   --   --   PROCALCITON  --   --  0.10 0.33 0.20    ABG  Recent Labs Lab 09/14/16 0915  PHART 7.357  PCO2ART 39.6  PO2ART 357*    Liver Enzymes  Recent Labs Lab 09/14/16 0535 09/15/16 0504  AST 20 27  ALT 12* 14  ALKPHOS 82 69  BILITOT 0.4 0.8  ALBUMIN 3.2* 2.6*    Cardiac Enzymes  Recent Labs Lab 09/14/16 0549 09/14/16 1050 09/14/16 1423  TROPONINI 0.06* 0.07* 0.07*    Glucose  Recent Labs Lab 09/18/16 1330 09/18/16 1634 09/18/16 1915 09/18/16 2323 09/19/16 0329 09/19/16 0746  GLUCAP 214* 117* 227* 171* 170* 185*    Imaging Dg Chest Port 1 View  Result Date:  09/19/2016 CLINICAL DATA:  Respiratory failure. EXAM: PORTABLE CHEST 1 VIEW COMPARISON:  09/18/2016. FINDINGS: Endotracheal tube, NG tube, left PICC line stable position . Cardiomegaly with slight increase in basilar infiltrates and small pleural effusions consistent with slight worsening of CHF. No pneumothorax. IMPRESSION: 1.  Lines and tubes in stable position. 2. Cardiomegaly with slight increase in basilar infiltrates and small pleural effusions consistent with slight worsening of CHF. Electronically Signed   By: Marcello Moores  Register   On: 09/19/2016 06:21   Dg Chest Port 1 View  Result Date: 09/18/2016 CLINICAL DATA:  PICC line placement. EXAM: PORTABLE CHEST 1 VIEW COMPARISON:  09/18/2016. FINDINGS: PICC line noted with its tip projected over the superior vena cava. Endotracheal tube and NG tube in stable position. Cardiomegaly with bilateral mild interstitial prominence and small pleural effusions  consistent CHF. Mild basilar atelectasis.Slight improvement from prior exam. IMPRESSION: 1.  Left PICC line in stable position. 2.  Endotracheal tube and NG tube in stable position. 3.  Mild from prior exam . Electronically Signed   By: Marcello Moores  Register   On: 09/18/2016 12:54    STUDIES:  Echo 8/11 >> EF 25 to 30%, mild AR, mild MR, PAS 53 mmHg  CULTURES: Sputum 8/17 >> negative Blood 8/17 >>  Urine 8/19 >> negative  ANTIBIOTICS: vanc 8/17>>>8/20 cefepime 8/17>>>  SIGNIFICANT EVENTS: 8/17 Admit 8/20 Pressure support weaning trial  LINES/TUBES: ETT 8/17 >> Lt PICC 8/21 >>  DISCUSSION: 76 year old with dementia, recent infected groin wound with staph, surgery on his stress some repair admitted with HCAP, a fib with RVR.  ASSESSMENT / PLAN:  PULMONARY A: Acute hypoxic respiratory failure 2nd to combination of HCAP and acute pulmonary edema. P:   Might be ready for extubation trial soon  CARDIOVASCULAR A:  Acute on chronic systolic CHF. Severe PAD s/p repair of Lt femoral pseudoaneurysm. A fib with RVR >> now in sinus rhythm. Sinus pauses 2nd to meds >> resolved. Hx of HLD. Difficult IV access. P:  Increase lopressor to 50 mg bid Transition from heparin to coumadin per pharmacy Continue lipitor, ASA, lasix  RENAL A:   Stage 3 CKD (baseline cr 1.28 to 1.5). Hypokalemia, hypomagnesemia. P:   Monitor renal fx, urine outpt, electrolytes  GASTROINTESTINAL A:   Severe protein calorie malnutrition w/ cachexia.  P:   Tube feeds on vent  HEMATOLOGIC A:   Anemia of chronic disease.  P:  F/u CBC  INFECTIOUS A:   HCAP. P:   Day 6/7 cefepime  ENDOCRINE A:   Diabetes w/ hyperglycemia. Hx of Gout. P:   SSI Allopurinol  NEUROLOGIC A:   Acute metabolic encephalopathy. Hx of dementia, seizures, CVA with Rt sided weakness >> bed bound.  P:   RASS goal 0 to -1 Continue aricept, keppra, zoloft  CC time 31 minutes  Chesley Mires, MD [place] 09/19/2016, 9:34 AM Pager:  [phone] After 3pm call: [phone]  She has tolerated SBT.  Will proceed with extubation.  Updated her son, Legrand Como, over the phone.  Explained current status and plan for extubation.  He would be in favor of reintubation if needed, but would want update if possible prior to this.  Chesley Mires, MD [facility] Pulmonary/Critical Care 09/19/2016, 10:07 AM Pager:  [phone] After 3pm call: [phone]

## 2016-09-19 NOTE — Procedures (Signed)
Extubation Procedure Note  Patient Details:   Name: Casey [name] DOB: 1940-08-17 MRN: [id number]   Airway Documentation:     Evaluation  O2 sats: 628 Complications: none Patient tolerated procedure well. Bilateral Breath Sounds: Rhonchi   Pt not speaking  Per CCM order, pt extubated and placed on 2L nasal cannula.   Martha Clan 09/19/2016, 10:28 AM

## 2016-09-19 NOTE — Progress Notes (Signed)
Nutrition Follow-up  DOCUMENTATION CODES:   Severe malnutrition in context of acute illness/injury  INTERVENTION:   Continue Jevity 1.2 @ 40 mL/hr with 30 mL Prostat BID via NGT. Continue 100 mL free water every 4 hours This will provide 1352 kcal, 83g of protein, 1374 ml H2O and 17g of fiber.  RD will continue to monitor  NUTRITION DIAGNOSIS:   Malnutrition (severe) related to acute illness, wound healing (abdominal wound) as evidenced by moderate depletion of body fat, severe depletion of muscle mass.  Ongoing.  GOAL:   Patient will meet greater than or equal to 90% of their needs  Will meet with TF.  MONITOR:   Labs, Weight trends, TF tolerance, Skin, I & O's  REASON FOR ASSESSMENT:   Consult Enteral/tube feeding initiation and management  ASSESSMENT:   76 year old woman with dementia, bedbound for 3 years, recent admission to [facility] for infected groin wound with staph following pseudoaneurysm repair admitted 8/17 with fevers, shortness of breath and cough, chest x-ray showing multifocal bilateral patchy infiltrates and atrial fibrillation/RVR. She was hypoxic requiring 2 L. She was started on Cardizem drip and became hypotensive and hence this was stopped. Developed respiratory distress and was emergently intubated in the ED  Patient extubated this morning with NGT-panda placed. TF to be resumed. Will advance tube feeds slightly to meet new estimated needs.   Medications: Lasix solution daily, Floranex granules TID, Protonix suspension daily Labs reviewed: CBGs: 170-185 Low Phos, Mg GFR: 43  Diet Order:  Diet NPO time specified  Skin:   (abdominal and groin incisions)  Last BM:  8/22  Height:   Ht Readings from Last 1 Encounters:  09/14/16 5\' 3"  (1.6 m)    Weight:   Wt Readings from Last 1 Encounters:  09/19/16 142 lb 3.2 oz (64.5 kg)    Ideal Body Weight:  52.3 kg  BMI:  Body mass index is 25.19 kg/m.  Estimated Nutritional Needs:   Kcal:   1250-1450  Protein:  75-85g  Fluid:  1.5L/day  EDUCATION NEEDS:   No education needs identified at this time  Clayton Bibles, MS, RD, LDN Pager: [phone] After Hours Pager: [phone]

## 2016-09-19 NOTE — Progress Notes (Signed)
ANTICOAGULATION CONSULT NOTE - Follow Up Consult  Pharmacy Consult for Heparin, Warfarin Indication: atrial fibrillation  Allergies  Allergen Reactions  . Penicillins Other (See Comments)    Told by a doctor to "not take" and on MAR as an allergy Has patient had a PCN reaction causing immediate rash, facial/tongue/throat swelling, SOB or lightheadedness with hypotension: Unknown Has patient had a PCN reaction causing severe rash involving mucus membranes or skin necrosis: Unknown Has patient had a PCN reaction that required hospitalization: Unknown Has patient had a PCN reaction occurring within the last 10 years: Unknown If all of the above answers are "NO", then may proceed with Ceph    Patient Measurements: Height: 5\' 3"  (160 cm) Weight: 142 lb 3.2 oz (64.5 kg) IBW/kg (Calculated) : 52.4 Heparin Dosing Weight: 64 kg  Vital Signs: Temp: 99.3 F (37.4 C) (08/22 0330) Temp Source: Axillary (08/22 0330) BP: 129/54 (08/22 0600) Pulse Rate: 132 (08/22 0417)  Labs:  Recent Labs  09/17/16 0337  09/18/16 0215 09/18/16 1155 09/18/16 2021 09/19/16 0235  HGB 10.0*  --  8.7*  --   --   --   HCT 30.0*  --  25.7*  --   --   --   PLT 295  --  271  --   --   --   LABPROT  --   --   --   --   --  16.9*  INR  --   --   --   --   --  1.36  HEPARINUNFRC <0.10*  < > <0.10* 0.46 1.10* 1.01*  CREATININE 1.21*  --  1.37*  --  1.31* 1.34*  < > = values in this interval not displayed.  Estimated Creatinine Clearance: 32.3 mL/min (A) (by C-G formula based on SCr of 1.34 mg/dL (H)).   Medications:  Infusions:  . ceFEPime (MAXIPIME) IV 2 g (09/18/16 1509)  . heparin 850 Units/hr (09/19/16 0600)    Assessment: 76 yo female with hx of Afib (NO anticoagulation PTA), dementia, CVA, seizures, bedbound, recent repair of left femoral pseduo aneurysm complictaed with infection at the surgical site treated with doxycycline now admitted with subjective fever cough shortness of breath and  generalized body aches. On tele, found to be in Afib with RVR and started on cardizem drip which was stopped due to hypotension.  Pharmacy is consulted to dose IV heparin and warfarin for anticoagulation for afib.  Today, 09/19/2016:  Heparin level remains supratherapeutic at 1.01 (6 hr level after rate change -- drawn a little early) despite rate decreased to 850 units/hr last night.  INR is subtherapeutic at 1.36 BUT dose on 8/21 not charted  RN reported saliva having a light pink color this morning but suspects it was d/t to OG trauma  Drug-drug intxn: being on abx can make pt more sensitive to warfarin   Goal of Therapy:  Heparin level 0.3-0.7 units/ml Monitor platelets by anticoagulation protocol: Yes   Plan:   Hold heparin drip for 1 hr and resume rate at 650 units/hr  Check 8 hr heparin level  Warfarin 4 mg PO today x1 dose at 1400  Monitor for s/s bleeding    Dia Sitter, PharmD, BCPS 09/19/2016 6:35 AM

## 2016-09-19 NOTE — Progress Notes (Signed)
ANTICOAGULATION CONSULT NOTE - Follow Up Consult  Please see previous progress note from Gretta Arab, PharmD.  Pharmacy dosing IV heparin for atrial fibrillation.  Heparin level this afternoon returns at 0.89 (supratherapeutic) on 650 units/hr.  Per discussion with RN, no change in previously reported pink color in saliva from this morning.  Plan: - Decrease heparin to 550 units/hr - Recheck heparin level in [place], PharmD, BCPS Pager: [phone] 09/19/2016 4:38 PM

## 2016-09-20 ENCOUNTER — Inpatient Hospital Stay (HOSPITAL_COMMUNITY): Payer: Medicare Other

## 2016-09-20 LAB — CBC
HCT: 28.3 % — ABNORMAL LOW (ref 36.0–46.0)
HEMOGLOBIN: 9 g/dL — AB (ref 12.0–15.0)
MCH: 27.1 pg (ref 26.0–34.0)
MCHC: 31.8 g/dL (ref 30.0–36.0)
MCV: 85.2 fL (ref 78.0–100.0)
PLATELETS: 225 10*3/uL (ref 150–400)
RBC: 3.32 MIL/uL — AB (ref 3.87–5.11)
RDW: 17.3 % — ABNORMAL HIGH (ref 11.5–15.5)
WBC: 11.9 10*3/uL — AB (ref 4.0–10.5)

## 2016-09-20 LAB — GLUCOSE, CAPILLARY
GLUCOSE-CAPILLARY: 145 mg/dL — AB (ref 65–99)
Glucose-Capillary: 175 mg/dL — ABNORMAL HIGH (ref 65–99)
Glucose-Capillary: 178 mg/dL — ABNORMAL HIGH (ref 65–99)
Glucose-Capillary: 199 mg/dL — ABNORMAL HIGH (ref 65–99)
Glucose-Capillary: 228 mg/dL — ABNORMAL HIGH (ref 65–99)
Glucose-Capillary: 241 mg/dL — ABNORMAL HIGH (ref 65–99)

## 2016-09-20 LAB — BASIC METABOLIC PANEL
Anion gap: 7 (ref 5–15)
BUN: 29 mg/dL — ABNORMAL HIGH (ref 6–20)
CHLORIDE: 112 mmol/L — AB (ref 101–111)
CO2: 24 mmol/L (ref 22–32)
CREATININE: 1.32 mg/dL — AB (ref 0.44–1.00)
Calcium: 9.7 mg/dL (ref 8.9–10.3)
GFR calc non Af Amer: 38 mL/min — ABNORMAL LOW (ref >60)
GFR, EST AFRICAN AMERICAN: 44 mL/min — AB (ref >60)
Glucose, Bld: 148 mg/dL — ABNORMAL HIGH (ref 65–99)
POTASSIUM: 4.4 mmol/L (ref 3.5–5.1)
SODIUM: 143 mmol/L (ref 135–145)

## 2016-09-20 LAB — HEPARIN LEVEL (UNFRACTIONATED)
HEPARIN UNFRACTIONATED: 0.3 [IU]/mL (ref 0.30–0.70)
Heparin Unfractionated: 0.56 IU/mL (ref 0.30–0.70)

## 2016-09-20 LAB — PROTIME-INR
INR: 1.25
PROTHROMBIN TIME: 15.8 s — AB (ref 11.4–15.2)

## 2016-09-20 LAB — MAGNESIUM: MAGNESIUM: 2 mg/dL (ref 1.7–2.4)

## 2016-09-20 MED ORDER — MAGNESIUM SULFATE 40 G IN LACTATED RINGERS - SIMPLE
1.0000 g/h | INTRAVENOUS | Status: DC
  Filled 2016-09-20: qty 500

## 2016-09-20 MED ORDER — HEPARIN (PORCINE) IN NACL 100-0.45 UNIT/ML-% IJ SOLN
600.0000 [IU]/h | INTRAMUSCULAR | Status: DC
  Administered 2016-09-21: 600 [IU]/h via INTRAVENOUS
  Filled 2016-09-20: qty 250

## 2016-09-20 MED ORDER — NITROPRUSSIDE SODIUM 25 MG/ML IV SOLN
0.0000 ug/kg/min | INTRAVENOUS | Status: AC
  Administered 2016-09-21: 0.4 ug/kg/min via INTRAVENOUS
  Administered 2016-09-21: 0.3 ug/kg/min via INTRAVENOUS
  Administered 2016-09-21: 0.4 ug/kg/min via INTRAVENOUS
  Filled 2016-09-20: qty 2

## 2016-09-20 MED ORDER — MAGNESIUM SULFATE BOLUS VIA INFUSION
4.0000 g | Freq: Once | INTRAVENOUS | Status: AC
  Administered 2016-09-21: 4 g via INTRAVENOUS
  Filled 2016-09-20: qty 500

## 2016-09-20 MED ORDER — DILTIAZEM LOAD VIA INFUSION
10.0000 mg | Freq: Once | INTRAVENOUS | Status: AC
  Administered 2016-09-20: 10 mg via INTRAVENOUS
  Filled 2016-09-20: qty 10

## 2016-09-20 MED ORDER — ADULT MULTIVITAMIN LIQUID CH
15.0000 mL | Freq: Every day | ORAL | Status: DC
  Administered 2016-09-20 – 2016-09-23 (×4): 15 mL
  Filled 2016-09-20 (×7): qty 15

## 2016-09-20 MED ORDER — WARFARIN SODIUM 6 MG PO TABS
6.0000 mg | ORAL_TABLET | Freq: Once | ORAL | Status: AC
  Administered 2016-09-20: 6 mg via ORAL
  Filled 2016-09-20 (×2): qty 1

## 2016-09-20 MED ORDER — MAGNESIUM SULFATE 40 G IN LACTATED RINGERS - SIMPLE
1.0000 g/h | INTRAVENOUS | Status: DC
  Administered 2016-09-21: 1 g/h via INTRAVENOUS
  Filled 2016-09-20: qty 500

## 2016-09-20 MED ORDER — NITROGLYCERIN IN D5W 200-5 MCG/ML-% IV SOLN: 0.0000 ug/min | INTRAVENOUS | Status: DC

## 2016-09-20 MED ORDER — FUROSEMIDE 10 MG/ML IJ SOLN
40.0000 mg | Freq: Once | INTRAMUSCULAR | Status: AC
  Administered 2016-09-20: 40 mg via INTRAVENOUS
  Filled 2016-09-20: qty 4

## 2016-09-20 MED ORDER — DILTIAZEM HCL-DEXTROSE 100-5 MG/100ML-% IV SOLN (PREMIX)
5.0000 mg/h | INTRAVENOUS | Status: DC
  Administered 2016-09-20: 5 mg/h via INTRAVENOUS
  Filled 2016-09-20 (×2): qty 100

## 2016-09-20 MED ORDER — NITROGLYCERIN IN D5W 200-5 MCG/ML-% IV SOLN
INTRAVENOUS | Status: AC
  Filled 2016-09-20: qty 250

## 2016-09-20 MED ORDER — CLONIDINE HCL 0.1 MG PO TABS
0.1000 mg | ORAL_TABLET | Freq: Three times a day (TID) | ORAL | Status: DC
  Administered 2016-09-20 – 2016-09-21 (×3): 0.1 mg
  Filled 2016-09-20 (×3): qty 1

## 2016-09-20 NOTE — Progress Notes (Signed)
ANTICOAGULATION CONSULT NOTE - Follow Up Consult  Pharmacy Consult for Heparin, Warfarin Indication: atrial fibrillation  Allergies  Allergen Reactions  . Penicillins Other (See Comments)    Told by a doctor to "not take" and on MAR as an allergy Has patient had a PCN reaction causing immediate rash, facial/tongue/throat swelling, SOB or lightheadedness with hypotension: Unknown Has patient had a PCN reaction causing severe rash involving mucus membranes or skin necrosis: Unknown Has patient had a PCN reaction that required hospitalization: Unknown Has patient had a PCN reaction occurring within the last 10 years: Unknown If all of the above answers are "NO", then may proceed with Ceph    Patient Measurements: Height: 5\' 3"  (160 cm) Weight: 142 lb 3.2 oz (64.5 kg) IBW/kg (Calculated) : 52.4 Heparin Dosing Weight: 64 kg  Vital Signs: Temp: 98.1 F (36.7 C) (08/23 1200) Temp Source: Oral (08/23 1200) BP: 181/110 (08/23 1400)  Labs:  Recent Labs  09/18/16 0215  09/18/16 2021 09/19/16 0235 09/19/16 0918 09/19/16 1539 09/20/16 0252  HGB 8.7*  --   --   --  8.1*  --  9.0*  HCT 25.7*  --   --   --  24.6*  --  28.3*  PLT 271  --   --   --  251  --  225  LABPROT  --   --   --  16.9*  --   --  15.8*  INR  --   --   --  1.36  --   --  1.25  HEPARINUNFRC <0.10*  < > 1.10* 1.01*  --  0.89* 0.56  CREATININE 1.37*  --  1.31* 1.34*  --   --  1.32*  < > = values in this interval not displayed.  Estimated Creatinine Clearance: 32.7 mL/min (A) (by C-G formula based on SCr of 1.32 mg/dL (H)).   Medications:  Infusions:  . ceFEPime (MAXIPIME) IV Stopped (09/20/16 1204)  . heparin 550 Units/hr (09/20/16 1400)    Assessment: 76 yo female with hx of Afib (NO anticoagulation PTA), dementia, CVA, seizures, bedbound, recent repair of left femoral pseduo aneurysm complictaed with infection at the surgical site treated with doxycycline now admitted with subjective fever cough shortness of  breath and generalized body aches. On tele, found to be in Afib with RVR and started on cardizem drip which was stopped due to hypotension.  Pharmacy is consulted to dose IV heparin and warfarin for anticoagulation for afib.   Today, 8/23  0252 HL=0.56 units/ml, no infusion issues, pink color saliva in canister, but not when RN suctioned her.   1402: HL is 0.30, therapeutic, but trending down and at lowest therapeutic goal.  Hgb 9.0 low but stable , plt 225  Scr 1.32, CrCl 33 ml/min   INR 1.25 subtherapeutic, down from 1.36 after warfarin 4 mg dose   Goal of Therapy:  Heparin level 0.3-0.7 units/ml Monitor platelets by anticoagulation protocol: Yes   Plan:    Increase heparin drip to 600 units/hr  Recheck HL in 8 hours.   Warfarin 6 mg PO x 1 at 1800  Daily heparin level, INR, and CBC  Continue to monitor H&H and platelets  Patient will need a minimum of 5 days warfarin / Heparin overlap and until INR > 2 for 24 hours.   Provide warfarin education to family/caretakers if possible.  Pt is not appropriate for education at this time d/t dementia.     Royetta Asal, PharmD, BCPS Pager [phone] 09/20/2016 2:12  PM

## 2016-09-20 NOTE — Progress Notes (Signed)
ANTICOAGULATION CONSULT NOTE - Follow Up Consult  Pharmacy Consult for Heparin, Warfarin Indication: atrial fibrillation  Allergies  Allergen Reactions  . Penicillins Other (See Comments)    Told by a doctor to "not take" and on MAR as an allergy Has patient had a PCN reaction causing immediate rash, facial/tongue/throat swelling, SOB or lightheadedness with hypotension: Unknown Has patient had a PCN reaction causing severe rash involving mucus membranes or skin necrosis: Unknown Has patient had a PCN reaction that required hospitalization: Unknown Has patient had a PCN reaction occurring within the last 10 years: Unknown If all of the above answers are "NO", then may proceed with Ceph    Patient Measurements: Height: 5\' 3"  (160 cm) Weight: 142 lb 3.2 oz (64.5 kg) IBW/kg (Calculated) : 52.4 Heparin Dosing Weight: 64 kg  Vital Signs: Temp: 97.4 F (36.3 C) (08/22 2355) Temp Source: Oral (08/22 2355) BP: 151/82 (08/23 0000) Pulse Rate: 95 (08/22 1600)  Labs:  Recent Labs  09/18/16 0215  09/18/16 2021 09/19/16 0235 09/19/16 0918 09/19/16 1539 09/20/16 0252  HGB 8.7*  --   --   --  8.1*  --  9.0*  HCT 25.7*  --   --   --  24.6*  --  28.3*  PLT 271  --   --   --  251  --  225  LABPROT  --   --   --  16.9*  --   --  15.8*  INR  --   --   --  1.36  --   --  1.25  HEPARINUNFRC <0.10*  < > 1.10* 1.01*  --  0.89* 0.56  CREATININE 1.37*  --  1.31* 1.34*  --   --  1.32*  < > = values in this interval not displayed.  Estimated Creatinine Clearance: 32.7 mL/min (A) (by C-G formula based on SCr of 1.32 mg/dL (H)).   Medications:  Infusions:  . ceFEPime (MAXIPIME) IV Stopped (09/19/16 1219)  . heparin 550 Units/hr (09/20/16 0000)    Assessment: 76 yo female with hx of Afib (NO anticoagulation PTA), dementia, CVA, seizures, bedbound, recent repair of left femoral pseduo aneurysm complictaed with infection at the surgical site treated with doxycycline now admitted with  subjective fever cough shortness of breath and generalized body aches. On tele, found to be in Afib with RVR and started on cardizem drip which was stopped due to hypotension.  Pharmacy is consulted to dose IV heparin and warfarin for anticoagulation for afib.  8/22  Heparin level 0.46, therapeutic  RN reports heparin infusion was off for an unspecified amount of time this morning.  When she arrived the arm IV had infiltrated (pt has history of very difficult IV access) and the heparin was restarted in left hand IV line.  A PICC line has now been placed.  Hgb decreased to 8.7, anemia of chronic disease noted by MD  No bleeding or complications reported.  Groin site of left fem to fem bypass (08/30/16) does not have any bleeding after staple removal on 8/21.  1539 HL=0.89  Baseline INR 1.3 (8/18) Today, 8/23  0252 HL=0.56 units/ml, no infusion issues, pink color salvia in canister, but not when RN suctioned her.   Goal of Therapy:  Heparin level 0.3-0.7 units/ml Monitor platelets by anticoagulation protocol: Yes   Plan:    Continue heparin drip at 550 units/hr  Recheck HL in 8 hours. (confirmatory)  Daily heparin level, INR, and CBC  Continue to monitor H&H and platelets  Patient will need a minimum of 5 days warfarin / Heparin overlap and until INR > 2 for 24 hours.   Provide warfarin education to family/caretakers if possible.  Pt is not appropriate for education at this time d/t dementia.    Dorrene German 09/20/2016 3:26 AM

## 2016-09-20 NOTE — Progress Notes (Signed)
Warren Lacy MD notified for hypertension ( DBP >100) and tachycardia (~130-150's) with no improvement from PRN metoprolol.  Patient currently resting in bed with eyes closed.   Orders to be placed.   Will continue to monitor.

## 2016-09-20 NOTE — Progress Notes (Signed)
Nutrition Follow-up  DOCUMENTATION CODES:   Severe malnutrition in context of acute illness/injury  INTERVENTION:  - Continue Jevity 1.2 @ 40 mL/hr with 30 mL Prostat BID and 100 mL free water every 4 hours. - Will order 15 mL liquid multivitamin per tube/day given TF rate <45 mL/hr. - Will monitor CBGs and if need to switch formula to better assist with glycemic control.   NUTRITION DIAGNOSIS:   Malnutrition (severe) related to acute illness, wound healing (abdominal wound) as evidenced by moderate depletion of body fat, severe depletion of muscle mass. -ongoing  GOAL:   Patient will meet greater than or equal to 90% of their needs -met with TF regimen  MONITOR:   TF tolerance, Weight trends, Labs, Skin, I & O's  ASSESSMENT:   76 year old woman with dementia, bedbound for 3 years, recent admission to [facility] for infected groin wound with staph following pseudoaneurysm repair admitted 8/17 with fevers, shortness of breath and cough, chest x-ray showing multifocal bilateral patchy infiltrates and atrial fibrillation/RVR. She was hypoxic requiring 2 L. She was started on Cardizem drip and became hypotensive and hence this was stopped. Developed respiratory distress and was emergently intubated in the ED  8/23 Pt with Panda in place and receiving Jevity 1.2 @ 40 mL/hr with 30 mL Prostat BID and 100 mL free water every 4 hours. This regimen is providing 1352 kcal, 83 grams of protein, and 1375 mL free water. SLP performed bedside swallow evaluation earlier this AM and recommends NPO with plan to follow-up tomorrow. Per rounds this AM, plans to transfer to telemetry if bed becomes available. Weight stable from yesterday and only +0.4 kg from admission weight. PCCM NP note from this AM states plan to continue free water per [facility] until PO intake becomes possible.   Medications reviewed; 20 mg Lasix per Panda/day, sliding scale Novolog, 40 mg Protonix per Panda/day. Labs reviewed; CBGs: 145 and  175 mg/dL this AM, Cl: 112 mmol/L, BUN: 29 mg/dL, creatinine: 1.32 mg/dL, GFR: 44 mL/min.    8/22 - Patient extubated this morning. - Panda placed today. TF to be resumed.  - Will advance tube feeds slightly to meet new estimated needs.    Diet Order:  Diet NPO time specified  Skin:   (abdominal and groin incisions)  Last BM:  8/23  Height:   Ht Readings from Last 1 Encounters:  09/14/16 _0  (1.6 m)    Weight:   Wt Readings from Last 1 Encounters:  09/20/16 142 lb 3.2 oz (64.5 kg)    Ideal Body Weight:  52.3 kg  BMI:  Body mass index is 25.19 kg/m.  Estimated Nutritional Needs:   Kcal:  1250-1450  Protein:  75-85g  Fluid:  1.5L/day  EDUCATION NEEDS:   No education needs identified at this time    Jarome Matin, MS, RD, LDN, CNSC Inpatient Clinical Dietitian Pager # [phone] After hours/weekend pager # [phone]

## 2016-09-20 NOTE — Progress Notes (Signed)
eLink Physician-Brief Progress Note Patient Name: Casey [name] DOB: Jan 15, 1941 MRN: [id number]   Date of Service  09/20/2016  HPI/Events of Note  Better rate control on cardizem but dropping sats now on 8lpm and diphoretic  eICU Interventions  Bipap/ lasix/ cxr      Intervention Category Major Interventions: Respiratory failure - evaluation and management  Christinia Gully 09/20/2016, 10:33 PM

## 2016-09-20 NOTE — Evaluation (Signed)
Clinical/Bedside Swallow Evaluation Patient Details  Name: Casey [name] MRN: [id number] Date of Birth: 26-Mar-1940  Today's Date: 09/20/2016 Time: SLP Start Time (ACUTE ONLY): 1000 SLP Stop Time (ACUTE ONLY): 1020 SLP Time Calculation (min) (ACUTE ONLY): 20 min  Past Medical History:  Past Medical History:  Diagnosis Date  . Abdominal or pelvic swelling, mass, or lump, left upper quadrant   . CHF (congestive heart failure) ([place])   . Chronic atrial fibrillation ([place])   . Chronic kidney disease   . Congestive heart failure, unspecified   . Coronary atherosclerosis of unspecified type of vessel, native or graft   . Diabetes mellitus   . Edema   . GERD (gastroesophageal reflux disease)   . Gout, unspecified   . Hypercholesterolemia   . Hypertension   . 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  . Peripheral vascular disease, unspecified ([place])   . Seizures ([place])   . Sinus of Valsalva aneurysm    a. By 2D echo 05/2011.  . Stroke ([place])   . Unspecified hereditary and idiopathic peripheral neuropathy   . Unspecified urinary incontinence   . Vascular dementia, uncomplicated    Past Surgical History:  Past Surgical History:  Procedure Laterality Date  . AORTOGRAM  09/01/2008   Abd w/ bilateral lower extremity runoff arteriography  . APPLICATION OF WOUND VAC Left 08/30/2016   Procedure: APPLICATION OF WOUND VAC LEFT GROIN;  Surgeon: Serafina Mitchell, MD;  Location: [place];  Service: Vascular;  Laterality: Left;  . ENDARTERECTOMY FEMORAL Left 08/30/2016   Procedure: LEFT COMMON FEMORAL ARTERY ENDARTERECTOMY;  Surgeon: Serafina Mitchell, MD;  Location: [place];  Service: Vascular;  Laterality: Left;  . FALSE ANEURYSM REPAIR  12/06/2011   Procedure: REPAIR FALSE ANEURYSM;  Surgeon: Angelia Mould, MD;  Location: [facility] OR;  Service: Vascular;   Laterality: Left;  Repair of left femoral Artery pseudoaneurysm  . FALSE ANEURYSM REPAIR Left 08/30/2016   Procedure: REPAIR PSEUDOANEURYSM LEFT GROIN;  Surgeon: Serafina Mitchell, MD;  Location: [place];  Service: Vascular;  Laterality: Left;  . FEMORAL ARTERY EXPLORATION  12/06/2011   Procedure: FEMORAL ARTERY EXPLORATION;  Surgeon: Angelia Mould, MD;  Location: [facility] OR;  Service: Vascular;  Laterality: N/A;  Exploration of large pseudoaneurysm left side of fem-fem bypass graft   . femoral to femoral bypass graft     . FEMORAL-FEMORAL BYPASS GRAFT  12/06/2011   Procedure: BYPASS GRAFT FEMORAL-FEMORAL ARTERY;  Surgeon: Angelia Mould, MD;  Location: [facility] OR;  Service: Vascular;  Laterality: Bilateral;  Revision of left to right Femoral-Femoral bypass graft  . FEMORAL-FEMORAL BYPASS GRAFT Left 08/30/2016   Procedure: REDO RIGHT TO LEFT FEMORAL-FEMORAL ARTERY BYPASS GRAFT;  Surgeon: Serafina Mitchell, MD;  Location: [place];  Service: Vascular;  Laterality: Left;  . FEMORAL-POPLITEAL BYPASS GRAFT Left 05/2002   lower extremity femoral to below knee w/ non-versed greater saphenous vein  . FEMORAL-POPLITEAL BYPASS GRAFT Left 11/2002  . FEMORAL-POPLITEAL BYPASS GRAFT Left 08/30/2016   Procedure: REVISION OF LEFT FEMORAL-POPLITEAL ARTERY BYPASS GRAFT;  Surgeon: Serafina Mitchell, MD;  Location: [place];  Service: Vascular;  Laterality: Left;  . THROMBECTOMY FEMORAL ARTERY Left 08/30/2016   Procedure: THROMBECTOMY OF FEMORAL-FEMORAL ARTERY BYPASS GRAFT AND LEFT FEMORAL TO LEFT POPLITEAL BYPASS GRAFT;  Surgeon: Serafina Mitchell, MD;  Location: [place];  Service: Vascular;  Laterality: Left;  . TUBAL LIGATION Bilateral    HPI:  Casey [name] a 76 y.o.femalewith a past medical history significant for lymphoma, dementia, pAF not on anticoag, CVA with R hemiparesis, CHF EF 35%, HTN, PVD s/p fem-fem bypass, seizures and legally blindwho presents with AMS, Pt found to have pna and required intubation from  8/17-8/22.  Swallow evaluation ordered.    Assessment / Plan / Recommendation Clinical Impression  Pt presents with symptoms concerning for pharyngeal dysphagia - suspected due to intubation x6 days.  Voice is nearly aphonic and cough is weak.  After oral care, provided pt with single small ice chip and  tsp of water.  Swallows followed immediately with cough and belching- concerning for aspiration and cervical-esophageal dysphagia contribution.    Per chart review, during last admit, belching during intake was an issue.  Pt admits to dysphagia prior to admission - uncertain to historical abilities given dementia.  However given dysphagia, extended intubation, deconditioning, and probable premorbid dysphagia- recommend npo and following pt clinically to determine readiness for po or instrumental evaluation.  Suspect would benefit from instrumental evaluation when appropriate/ready.  Informed pt and RN to recommendations.    Of note, pt has small bore feeding tube in place at this time.  SLP reviewed MOST form in chart- anticipate pt will not desire long term feeding tube based on MOST documentation.    SLP Visit Diagnosis: Dysphagia, pharyngeal phase (R13.13);Dysphagia, pharyngoesophageal phase (R13.14)    Aspiration Risk  Severe aspiration risk;Risk for inadequate nutrition/hydration    Diet Recommendation NPO (oral care)   Medication Administration: Via alternative means    Other  Recommendations Oral Care Recommendations: Oral care QID   Follow up Recommendations        Frequency and Duration min 2x/week  2 weeks       Prognosis Prognosis for Safe Diet Advancement: Guarded Barriers to Reach Goals: Severity of deficits;Cognitive deficits      Swallow Study   General Date of Onset: 09/20/16 HPI: Casey [name] a 76 y.o.femalewith a past medical history significant for lymphoma, dementia, pAF not on anticoag, CVA with R hemiparesis, CHF EF 35%, HTN, PVD s/p fem-fem bypass,  seizures and legally blindwho presents with AMS, Pt found to have pna and required intubation from 8/17-8/22.  Swallow evaluation ordered.  Type of Study: Bedside Swallow Evaluation Diet Prior to this Study: NPO;Other (Comment) (small bore panda) Temperature Spikes Noted: No Respiratory Status: Nasal cannula History of Recent Intubation: Yes Length of Intubations (days): 6 days Date extubated: 09/19/16 Behavior/Cognition: Alert;Cooperative;Confused Oral Cavity Assessment: Within Functional Limits Oral Care Completed by SLP: No Oral Cavity - Dentition: Other (Comment) (few lower teeth, upper denture in room - not placed) Vision: Impaired for self-feeding Self-Feeding Abilities: Total assist Patient Positioning: Upright in bed Baseline Vocal Quality: Aphonic (suspect due to intubation, reversible hopefully ) Volitional Cough: Weak Volitional Swallow: Able to elicit    Oral/Motor/Sensory Function Overall Oral Motor/Sensory Function: Generalized oral weakness   Ice Chips Ice chips: Impaired Presentation: Spoon Pharyngeal Phase Impairments: Multiple swallows;Cough - Immediate Other Comments: excessive belching   Thin Liquid Thin Liquid: Impaired Presentation: Spoon Pharyngeal  Phase Impairments: Multiple swallows;Cough - Immediate Other Comments: excessive belching    Nectar Thick Nectar Thick Liquid: Not tested   Honey Thick Honey Thick Liquid: Not tested   Puree Puree: Not tested   Solid   GO   Solid: Not tested        Macario Golds 09/20/2016,11:11 AM Jonelle Sidle  Ileana Roup, [facility] [place]

## 2016-09-20 NOTE — Progress Notes (Signed)
[place] Progress Note Patient Name: Casey [name] DOB: [birth year] MRN: [id number]   Date of Service  09/20/2016  HPI/Events of Note  Hypertensive and tachycardic on BB po and prn IV but looks comfortable on camera  eICU Interventions  Add clonidine per ft      Intervention Category Major Interventions: Hypertension - evaluation and management  Christinia Gully 09/20/2016, 4:34 PM

## 2016-09-20 NOTE — Progress Notes (Signed)
PULMONARY / CRITICAL CARE MEDICINE   Name: Casey [name] MRN: [id number] DOB: January 20, 1941    ADMISSION DATE:  09/14/2016 CONSULTATION DATE:  8/17  REFERRING MD:  Rodena Piety   CHIEF COMPLAINT:  Acute respiratory failure   HISTORY OF PRESENT ILLNESS:   76 yo female d/c from [facility] on 09/09/16 after tx for wound infection s/p Lt femoral pseudoaneurysm repair and had Staph aureus in blood culture.  She was d/c to SNF with wound vac and doxycycline.  She has hx of dementia and is bed bound at baseline for past several years.  She also has hx of systolic CHF.  She presented with fever, dyspnea, cough, and body aches.  She was found to have new pulmonary infiltrate with concern for HCAP, A fib with RVR and required intubation.  Extubated 8/22.  SUBJECTIVE:  Tolerated extubation overnight.  On 2L/[place].  2L+ for admit. WBC 11/afebrile.   VITAL SIGNS: BP (!) 149/84   Pulse 95   Temp 98.4 F (36.9 C) (Oral)   Resp (!) 24   Ht 5\' 3"  (1.6 m)   Wt 142 lb 3.2 oz (64.5 kg)   SpO2 100%   BMI 25.19 kg/m   VENTILATOR SETTINGS:    INTAKE / OUTPUT: I/O last 3 completed shifts: In: 2176.1 [I.V.:320.1; NG/GT:1806; IV Piggyback:50] Out: 5427 [Urine:1800; Stool:2]  PHYSICAL EXAMINATION: General: chronically ill appearing female in NAD HEENT: MM pink/moist, no jvd, small bore feeding tube in place PSY: calm/appropriate Neuro: Awake, alert to self, thinks it is May, speech clear CV: s1s2 rrr, no m/r/g PULM: even/non-labored, lungs bilaterally clear anterior, diminished bilateral lower  CW:CBJS, non-tender, bsx4 active  Extremities: warm/dry, no edema, BLE with SCD's, heel protectors  Skin: Left groin incision with dry 4/4 over site, small amount light yellow drainage on gauze  LABS:  BMET  Recent Labs Lab 09/18/16 2021 09/19/16 0235 09/20/16 0252  NA 146* 145 143  K 5.2* 4.1 4.4  CL 117* 117* 112*  CO2 23 23 24   BUN 24* 26* 29*  CREATININE 1.31* 1.34* 1.32*  GLUCOSE 242* 149* 148*     Electrolytes  Recent Labs Lab 09/16/16 0406 09/17/16 0337  09/18/16 2021 09/19/16 0235 09/20/16 0252  CALCIUM 9.1 9.1  < > 9.4 9.4 9.7  MG 1.2* 1.1*  < > 1.6* 1.6* 2.0  PHOS 3.0 3.4  --   --  1.9*  --   < > = values in this interval not displayed.  CBC  Recent Labs Lab 09/18/16 0215 09/19/16 0918 09/20/16 0252  WBC 7.9 10.3 11.9*  HGB 8.7* 8.1* 9.0*  HCT 25.7* 24.6* 28.3*  PLT 271 251 225    Coag's  Recent Labs Lab 09/15/16 1558 09/19/16 0235 09/20/16 0252  APTT 86*  --   --   INR 1.30 1.36 1.25    Sepsis Markers  Recent Labs Lab 09/14/16 0633 09/14/16 0840 09/14/16 1050 09/15/16 0504 09/16/16 0406  LATICACIDVEN 1.86 1.92*  --   --   --   PROCALCITON  --   --  0.10 0.33 0.20    ABG  Recent Labs Lab 09/14/16 0915  PHART 7.357  PCO2ART 39.6  PO2ART 357*    Liver Enzymes  Recent Labs Lab 09/14/16 0535 09/15/16 0504  AST 20 27  ALT 12* 14  ALKPHOS 82 69  BILITOT 0.4 0.8  ALBUMIN 3.2* 2.6*    Cardiac Enzymes  Recent Labs Lab 09/14/16 0549 09/14/16 1050 09/14/16 1423  TROPONINI 0.06* 0.07* 0.07*  Glucose  Recent Labs Lab 09/19/16 0746 09/19/16 1123 09/19/16 1518 09/19/16 1937 09/19/16 2353 09/20/16 0332  GLUCAP 185* 213* 125* 149* 197* 145*    Imaging Dg Abd 1 View  Result Date: 09/19/2016 CLINICAL DATA:  Pain and a placement EXAM: ABDOMEN - 1 VIEW COMPARISON:  09/14/2016 FINDINGS: Soft feeding tube enters the abdomen with its tip in the midportion of the stomach. Bowel gas pattern appears normal. IMPRESSION: Feeding tube tip in the midportion of the stomach. Electronically Signed   By: Nelson Chimes M.D.   On: 09/19/2016 11:17    STUDIES:  Echo 8/11 >> EF 25 to 30%, mild AR, mild MR, PAS 53 mmHg  CULTURES: Sputum 8/17 >> negative Blood 8/17 >> negative  Urine 8/19 >> negative  ANTIBIOTICS: vanc 8/17>>>8/20 cefepime 8/17>>>8/23  SIGNIFICANT EVENTS: 8/17 Admit 8/20 Pressure support weaning  trial  LINES/TUBES: ETT 8/17 >> Lt PICC 8/21 >>  DISCUSSION: 76 year old with dementia, recent infected groin wound with staph, recent surgery on left groin for femoral pseudoaneurysm admitted with HCAP, a fib with RVR.  ASSESSMENT / PLAN:  PULMONARY A: Acute hypoxic respiratory failure 2nd to combination of HCAP and acute pulmonary edema. P:   Pulmonary hygiene - IS as able, mobilize  O2 to support sats > 92% Lasix 20 mg QD  CARDIOVASCULAR A:  Acute on chronic systolic CHF. Severe PAD s/p repair of Lt femoral pseudoaneurysm. A fib with RVR - RVR resolved, intermittent AF Sinus pauses 2nd to meds >> resolved. Hx of HLD. Difficult IV access. P:  Continue lopressor 50 mg BID  Coumadin per Pharmacy, appreciate assistance Continue lipitor, ASA Tele monitoring   RENAL A:   Stage 3 CKD (baseline cr 1.28 to 1.5). Hypokalemia, hypomagnesemia. P:   Trend BMP / urinary output Replace electrolytes as indicated Avoid nephrotoxic agents as able, ensure adequate renal perfusion Continue free water per tube until ok for oral intake  GASTROINTESTINAL A:   Severe protein calorie malnutrition w/ cachexia.  P:   NPO  SLP evaluation to assess for dysphagia given hx of intubation & dementia/CVA  HEMATOLOGIC A:   Anemia of chronic disease.  P:  Trend CBC Transfuse per ICU guidelines   INFECTIOUS A:   HCAP. P:   Day 7/7 cefepime Monitor fever curve / WBC trend  ENDOCRINE A:   Diabetes w/ hyperglycemia. Hx of Gout. P:  SSI  Continue Allopurinol   NEUROLOGIC A:   Acute metabolic encephalopathy. Hx of dementia, seizures, CVA with Rt sided weakness >> bed bound.  P:   Continue Aricept, Keppra, Zoloft Hold home Remeron, Oxycodone Frequent reorientation, PT efforts   Tx SDU status, TRH as of am 8/24.    Noe Gens, NP-C [facility] Pgr: [phone] or if no answer [phone] 09/20/2016, 8:07 AM

## 2016-09-20 NOTE — Progress Notes (Signed)
eLink Physician-Brief Progress Note Patient Name: Casey [name] DOB: [birth year] MRN: [id number]   Date of Service  09/20/2016  HPI/Events of Note  Rapid AF despite metaprolol per ft and starting clonidine also for hbp  eICU Interventions  Start cardizem drip     Intervention Category Major Interventions: Arrhythmia - evaluation and management  Christinia Gully 09/20/2016, 9:30 PM

## 2016-09-21 ENCOUNTER — Inpatient Hospital Stay (HOSPITAL_COMMUNITY): Payer: Medicare Other

## 2016-09-21 DIAGNOSIS — I420 Dilated cardiomyopathy: Secondary | ICD-10-CM | POA: Diagnosis present

## 2016-09-21 DIAGNOSIS — J810 Acute pulmonary edema: Secondary | ICD-10-CM

## 2016-09-21 DIAGNOSIS — I5023 Acute on chronic systolic (congestive) heart failure: Secondary | ICD-10-CM

## 2016-09-21 LAB — BASIC METABOLIC PANEL
ANION GAP: 7 (ref 5–15)
BUN: 33 mg/dL — ABNORMAL HIGH (ref 6–20)
CALCIUM: 9.9 mg/dL (ref 8.9–10.3)
CO2: 28 mmol/L (ref 22–32)
CREATININE: 1.21 mg/dL — AB (ref 0.44–1.00)
Chloride: 104 mmol/L (ref 101–111)
GFR calc Af Amer: 49 mL/min — ABNORMAL LOW (ref >60)
GFR, EST NON AFRICAN AMERICAN: 42 mL/min — AB (ref >60)
GLUCOSE: 237 mg/dL — AB (ref 65–99)
Potassium: 3.8 mmol/L (ref 3.5–5.1)
Sodium: 139 mmol/L (ref 135–145)

## 2016-09-21 LAB — MAGNESIUM
MAGNESIUM: 4.4 mg/dL — AB (ref 1.7–2.4)
MAGNESIUM: 3.2 mg/dL — AB (ref 1.7–2.4)
Magnesium: 2.8 mg/dL — ABNORMAL HIGH (ref 1.7–2.4)
Magnesium: 2.8 mg/dL — ABNORMAL HIGH (ref 1.7–2.4)

## 2016-09-21 LAB — CBC
HCT: 27.9 % — ABNORMAL LOW (ref 36.0–46.0)
Hemoglobin: 8.9 g/dL — ABNORMAL LOW (ref 12.0–15.0)
MCH: 26.7 pg (ref 26.0–34.0)
MCHC: 31.9 g/dL (ref 30.0–36.0)
MCV: 83.8 fL (ref 78.0–100.0)
PLATELETS: 277 10*3/uL (ref 150–400)
RBC: 3.33 MIL/uL — ABNORMAL LOW (ref 3.87–5.11)
RDW: 16.9 % — AB (ref 11.5–15.5)
WBC: 12.9 10*3/uL — AB (ref 4.0–10.5)

## 2016-09-21 LAB — HEPARIN LEVEL (UNFRACTIONATED)
Heparin Unfractionated: 0.34 IU/mL (ref 0.30–0.70)
Heparin Unfractionated: 0.42 IU/mL (ref 0.30–0.70)

## 2016-09-21 LAB — PROTIME-INR
INR: 1.39
Prothrombin Time: 17.2 seconds — ABNORMAL HIGH (ref 11.4–15.2)

## 2016-09-21 LAB — GLUCOSE, CAPILLARY
GLUCOSE-CAPILLARY: 188 mg/dL — AB (ref 65–99)
GLUCOSE-CAPILLARY: 250 mg/dL — AB (ref 65–99)
GLUCOSE-CAPILLARY: 155 mg/dL — AB (ref 65–99)
GLUCOSE-CAPILLARY: 133 mg/dL — AB (ref 65–99)
GLUCOSE-CAPILLARY: 202 mg/dL — AB (ref 65–99)
Glucose-Capillary: 212 mg/dL — ABNORMAL HIGH (ref 65–99)

## 2016-09-21 MED ORDER — METOPROLOL TARTRATE 25 MG PO TABS
100.0000 mg | ORAL_TABLET | Freq: Two times a day (BID) | ORAL | Status: DC
  Administered 2016-09-21: 100 mg
  Filled 2016-09-21: qty 4

## 2016-09-21 MED ORDER — AMLODIPINE 1 MG/ML ORAL SUSPENSION: 5.0000 mg | Freq: Every day | ORAL | Status: DC

## 2016-09-21 MED ORDER — FUROSEMIDE 10 MG/ML IJ SOLN
40.0000 mg | Freq: Two times a day (BID) | INTRAMUSCULAR | Status: DC
  Administered 2016-09-21 – 2016-09-27 (×12): 40 mg via INTRAVENOUS
  Filled 2016-09-21 (×12): qty 4

## 2016-09-21 MED ORDER — WARFARIN SODIUM 6 MG PO TABS
6.0000 mg | ORAL_TABLET | Freq: Once | ORAL | Status: DC
  Filled 2016-09-21: qty 1

## 2016-09-21 MED ORDER — METOPROLOL TARTRATE 25 MG PO TABS: 100.0000 mg | ORAL_TABLET | Freq: Two times a day (BID) | ORAL | Status: DC

## 2016-09-21 MED ORDER — AMLODIPINE 1 MG/ML ORAL SUSPENSION
5.0000 mg | Freq: Every day | ORAL | Status: DC
  Filled 2016-09-21: qty 5

## 2016-09-21 MED ORDER — METOPROLOL TARTRATE 25 MG/10 ML ORAL SUSPENSION
100.0000 mg | Freq: Two times a day (BID) | ORAL | Status: DC
  Administered 2016-09-21 – 2016-09-23 (×5): 100 mg
  Filled 2016-09-21 (×7): qty 40

## 2016-09-21 MED ORDER — AMLODIPINE 1 MG/ML ORAL SUSPENSION
5.0000 mg | Freq: Every day | ORAL | Status: DC
  Filled 2016-09-21 (×3): qty 5

## 2016-09-21 MED ORDER — CLONIDINE HCL 0.1 MG PO TABS: 0.2000 mg | ORAL_TABLET | Freq: Three times a day (TID) | ORAL | Status: DC

## 2016-09-21 MED ORDER — LOSARTAN POTASSIUM 25 MG PO TABS
25.0000 mg | ORAL_TABLET | Freq: Every day | ORAL | Status: DC
  Administered 2016-09-21 – 2016-09-22 (×2): 25 mg via NASOGASTRIC
  Filled 2016-09-21 (×2): qty 1

## 2016-09-21 NOTE — Progress Notes (Signed)
ANTICOAGULATION CONSULT NOTE - Follow Up Consult  Pharmacy Consult for Heparin, Warfarin Indication: atrial fibrillation  Allergies  Allergen Reactions  . Penicillins Other (See Comments)    Told by a doctor to "not take" and on MAR as an allergy Has patient had a PCN reaction causing immediate rash, facial/tongue/throat swelling, SOB or lightheadedness with hypotension: Unknown Has patient had a PCN reaction causing severe rash involving mucus membranes or skin necrosis: Unknown Has patient had a PCN reaction that required hospitalization: Unknown Has patient had a PCN reaction occurring within the last 10 years: Unknown If all of the above answers are "NO", then may proceed with Ceph    Patient Measurements: Height: 5\' 3"  (160 cm) Weight: 138 lb 14.2 oz (63 kg) IBW/kg (Calculated) : 52.4 Heparin Dosing Weight: 64 kg  Vital Signs: Temp: 97 F (36.1 C) (08/24 0307) Temp Source: Axillary (08/24 0307) BP: 134/71 (08/24 0600) Pulse Rate: 76 (08/24 0326)  Labs:  Recent Labs  09/19/16 0235  09/19/16 2423  09/20/16 0252 09/20/16 1402 09/20/16 2350 09/21/16 0333 09/21/16 0447  HGB  --   < > 8.1*  --  9.0*  --   --   --  8.9*  HCT  --   --  24.6*  --  28.3*  --   --   --  27.9*  PLT  --   --  251  --  225  --   --   --  277  LABPROT 16.9*  --   --   --  15.8*  --   --  17.2*  --   INR 1.36  --   --   --  1.25  --   --  1.39  --   HEPARINUNFRC 1.01*  --   --   < > 0.56 0.30 0.42  --   --   CREATININE 1.34*  --   --   --  1.32*  --   --   --  1.21*  < > = values in this interval not displayed.  Estimated Creatinine Clearance: 35.3 mL/min (A) (by C-G formula based on SCr of 1.21 mg/dL (H)).   Medications:  Infusions:  . heparin 600 Units/hr (09/21/16 0600)  . nitroPRUSSide 0.6 mcg/kg/min (09/21/16 5361)    Assessment: 76 yo female with hx of Afib (NO anticoagulation PTA), dementia, CVA, seizures, bedbound, recent repair of left femoral pseduo aneurysm complictaed with  infection at the surgical site treated with doxycycline now admitted with subjective fever cough shortness of breath and generalized body aches. On tele, found to be in Afib with RVR and started on cardizem drip which was stopped due to hypotension.  Pharmacy is consulted to dose IV heparin and warfarin for anticoagulation for afib.   Today, 8/23  HL= 0.34 units/ml, remains therapeutic on heparin at 600 units/hr  Hgb low/stable at 8.9, Plt remain WNL at 277  No bleeding or complications reported.  RN reports that she has not seen any pink tinged oral secretions yet today.  Scr 1.21, CrCl 35 ml/min   INR 1.39 subtherapeutic but increased, after warfarin x3 doses  Goal of Therapy:  Heparin level 0.3-0.7 units/ml Monitor platelets by anticoagulation protocol: Yes   Plan:    Continue heparin drip at 600 units/hr  Warfarin 6 mg PO x 1 at 1800   Daily heparin level, INR, and CBC  Continue to monitor H&H and platelets  Patient will need a minimum of 5 days warfarin / Heparin overlap and  until INR > 2 for 24 hours.   Pharmacy will provide warfarin education to family/caretakers if possible.  Pt is not appropriate for education at this time d/t dementia.   Gretta Arab PharmD, BCPS Pager [phone] 09/21/2016 7:10 AM

## 2016-09-21 NOTE — Evaluation (Signed)
SLP Cancellation Note  Patient Details Name: Casey [name] MRN: [id number] DOB: 14-Apr-1940   Cancelled treatment:       Reason Eval/Treat Not Completed: Medical issues which prohibited therapy (pt on bipap currently, respiratory issues overnight, will continue to follow for po readiness)  Luanna Salk, [facility] SLP [phone]  Macario Golds 09/21/2016, 7:53 AM

## 2016-09-21 NOTE — Progress Notes (Signed)
76 year old woman admitted to the medical ICU with healthcare associated pneumonia who is since been extubated. patient course has been complicated today with atrial fibrillation RVR poorly responsive to rate controlling agents, hypertension and auditory insufficiency requiring noninvasive ventilation. At the time of my exam temperature is 97.2 blood pressure has a mean arterial pressure in the low 100s, heart rate 60-70 atrial fibrillation occasional PVCs respiratory rate 26 oxygen saturation 83% on 50% FiO2 BiPAP 14/6. Patient is responsive to noxious stimuli moves all extremities, heart sound no murmur, no peripheral edema, lungs have coarse rails with expiratory wheezing in the bases. Skin is cool and diaphoretic with poor capillary refill. Chest x-ray this evening shows small bilateral effusions and interstitial edema. Bedside ultrasound shows an IVC with respirophasic changes although not collapsing completely. A an appropriate sized right ventricle and a hypertrophic left ventricle with depressed ejection fraction. Bilateral lungs have diffuse B lines and small to moderate bilateral pleural effusions. Abdominal exam is benign.  Fluid balance since admission and is +2 L. Urine output 1.1 L today intake is primarily tube feeds. Renal function is stable with creatinine clearance in the 40s. CBC stable. Magnesium has been quite low and only today reached a normal value.  Assessment and plan Removal positive pressure ventilation by extubation, hypomagnesemia, atrial fibrillation, moderate hypertension. And a negative chronotropic likely all combine to produce a state of decompensated systolic CHF. The plan at this time is to wean to discontinue the calcium channel blocker, load with magnesium to help with suppressing atrial fibrillation with RVR, stop diuresis given the respirophasic IVC and no evidence of right sided fluid overload. Initiate afterload reduction with nitroprusside to decrease mean arterial  pressure tenderness 15% to approximately 80-90. Magnesium drip at 1 g an hour and monitor every 4 hour magnesium for goal of 3-4 (instead of >2).  Upon my evaluation, this patient had a high probability of imminent or life-threatening deterioration due to ADHF  high complexity decision making to assess, manipulate, and support vital organ system failure including Nitroprussdie w/MAP monitoring, Afib RVR mgmt  I have personally provided 40 minutes of critical care time exclusive of time spent on separately billable procedures and education. Time includes review and summation of previous medical record, laboratory data, radiology results, independent review of bedside Sono, coordination of care with RN, pharmacy, E-ICU, and monitoring for potential decompensation. Interventions were performed as documented above.  Condition:critical Prognosis:poor given comorbid conditions and severe CHF Code Status:full  Charlesetta Garibaldi, MD Critical Care Medicine

## 2016-09-21 NOTE — Progress Notes (Signed)
Pt seen, found asleep, resting comfortably, no respiratory distress/increased wob noted.  Pt on room air, HR97, rr23, spo2 93%.  Bipap not indicted at this time but remains in room on standby.

## 2016-09-21 NOTE — Progress Notes (Signed)
PT Cancellation Note  Patient Details Name: Casey [name] MRN: [id number] DOB: [birth year]   Cancelled Treatment:    Reason Eval/Treat Not Completed: Medical issues which prohibited therapy (not medically ready for PT this morning per RN)   [name] 09/21/2016, 10:33 AM Carmelia Bake, PT, DPT 09/21/2016 Pager: [phone]

## 2016-09-21 NOTE — Progress Notes (Signed)
eLink Physician-Brief Progress Note Patient Name: Casey [name] DOB: 01-04-1941 MRN: [id number]   Date of Service  09/21/2016  HPI/Events of Note  Watery stools - Request for Flexiseal.   eICU Interventions  Will order Lestine Box 09/21/2016, 2:38 AM

## 2016-09-21 NOTE — Progress Notes (Signed)
PULMONARY / CRITICAL CARE MEDICINE   Name: Casey [name] MRN: [id number] DOB: [birth year]    ADMISSION DATE:  09/14/2016 CONSULTATION DATE:  8/17  REFERRING MD:  Rodena Piety   CHIEF COMPLAINT:  Acute respiratory failure   HISTORY OF PRESENT ILLNESS:   76 yo female d/c from [facility] on 09/09/16 after tx for wound infection s/p Lt femoral pseudoaneurysm repair and had Staph aureus in blood culture.  She was d/c to SNF with wound vac and doxycycline.  She has hx of dementia and is bed bound at baseline for past several years.  She also has hx of systolic CHF.  She presented with fever, dyspnea, cough, and body aches.  She was found to have new pulmonary infiltrate with concern for HCAP, A fib with RVR and required intubation.  Extubated 8/22.  SUBJECTIVE:  Hypertensive, AFwRVR overnight, required BiPAP.  Started on nipride gtt.  Pt denies pain / SOB  VITAL SIGNS: BP 134/71   Pulse 81   Temp (!) 97 F (36.1 C) (Axillary)   Resp (!) 23   Ht 5\' 3"  (1.6 m)   Wt 138 lb 14.2 oz (63 kg)   SpO2 100%   BMI 24.60 kg/m   VENTILATOR SETTINGS: FiO2 (%):  [40 %] 40 %  INTAKE / OUTPUT: I/O last 3 completed shifts: In: 2224 [I.V.:334; NG/GT:1840; IV Piggyback:50] Out: 2727 [Urine:2725; Stool:2]  PHYSICAL EXAMINATION: General: frail elderly female in NAD on bipap  HEENT: MM pink/moist, small bore NGT, BiPAP mask in place, temporal wasting, jvd+ PSY: calm/appropriate Neuro: Awakens to voice, no distress, MAE CV: s1s2 rrr, no m/r/g PULM: even/non-labored, lungs bilaterally coarse  VQ:XIHW, non-tender, bsx4 active  Extremities: warm/dry, no edema  Skin: no rashes or lesions.  L femoral surgical site covered / clean / dry.  approx 1inch x2inch abrasion on right wrist  LABS:  BMET  Recent Labs Lab 09/19/16 0235 09/20/16 0252 09/21/16 0447  NA 145 143 139  K 4.1 4.4 3.8  CL 117* 112* 104  CO2 23 24 28   BUN 26* 29* 33*  CREATININE 1.34* 1.32* 1.21*  GLUCOSE 149* 148* 237*     Electrolytes  Recent Labs Lab 09/16/16 0406 09/17/16 0337  09/19/16 0235 09/20/16 0252 09/21/16 0136 09/21/16 0447  CALCIUM 9.1 9.1  < > 9.4 9.7  --  9.9  MG 1.2* 1.1*  < > 1.6* 2.0 4.4*  --   PHOS 3.0 3.4  --  1.9*  --   --   --   < > = values in this interval not displayed.  CBC  Recent Labs Lab 09/19/16 0918 09/20/16 0252 09/21/16 0447  WBC 10.3 11.9* 12.9*  HGB 8.1* 9.0* 8.9*  HCT 24.6* 28.3* 27.9*  PLT 251 225 277    Coag's  Recent Labs Lab 09/15/16 1558 09/19/16 0235 09/20/16 0252 09/21/16 0333  APTT 86*  --   --   --   INR 1.30 1.36 1.25 1.39    Sepsis Markers  Recent Labs Lab 09/14/16 1050 09/15/16 0504 09/16/16 0406  PROCALCITON 0.10 0.33 0.20    ABG  Recent Labs Lab 09/14/16 0915  PHART 7.357  PCO2ART 39.6  PO2ART 357*    Liver Enzymes  Recent Labs Lab 09/15/16 0504  AST 27  ALT 14  ALKPHOS 69  BILITOT 0.8  ALBUMIN 2.6*    Cardiac Enzymes  Recent Labs Lab 09/14/16 1050 09/14/16 1423  TROPONINI 0.07* 0.07*    Glucose  Recent Labs Lab 09/20/16 1123 09/20/16 1517 09/20/16  1925 09/20/16 2338 09/21/16 0441 09/21/16 0812  GLUCAP 228* 178* 199* 241* 212* 188*    Imaging Dg Abd 1 View  Result Date: 09/20/2016 CLINICAL DATA:  Evaluate NG tube EXAM: ABDOMEN - 1 VIEW COMPARISON:  Abdomen radiograph 09/19/2016 FINDINGS: Enteric tube tip projects over the left upper quadrant, feeding tube projects over the stomach. Mildly gaseous distended stomach. Nonobstructed bowel gas pattern. Lumbar spine degenerative changes. Cardiomegaly. Basilar atelectasis. Stent projects over the left hemipelvis. Bilateral lower abdominal surgical clips. IMPRESSION: Feeding tube within the mid stomach. Electronically Signed   By: Lovey Newcomer M.D.   On: 09/20/2016 15:38   Dg Chest Port 1 View  Result Date: 09/21/2016 CLINICAL DATA:  Pneumonia. EXAM: PORTABLE CHEST 1 VIEW COMPARISON:  09/20/2016. FINDINGS: Feeding tube, left PICC line in  stable position. Cardiomegaly with mild bilateral interstitial prominence and pleural effusions consistent with congestive heart failure. Similar findings on prior exam. Basilar atelectasis. No pneumothorax. IMPRESSION: 1. Lines and tubes in stable position. 2. Cardiomegaly with diffuse mild bilateral interstitial prominence and bilateral pleural effusions consistent with congestive heart failure. Similar findings noted on prior exam. 3. Basilar atelectasis . Electronically Signed   By: Marcello Moores  Register   On: 09/21/2016 06:23   Dg Chest Port 1 View  Result Date: 09/20/2016 CLINICAL DATA:  Dyspnea EXAM: PORTABLE CHEST 1 VIEW COMPARISON:  09/19/2016 FINDINGS: Multifocal perihilar and bilateral lower lobe opacities, suspicious for moderate interstitial edema, less likely multifocal pneumonia. Associated small bilateral pleural effusions, right greater than left. Overall appearance is likely mildly progressed. Cardiomegaly. Left arm PICC terminates at the cavoatrial junction. Prior enteric tube has been replaced with a feeding tube which courses into the stomach. IMPRESSION: Suspected moderate interstitial edema, mildly progressed. Small bilateral pleural effusions, right greater than left. Electronically Signed   By: Julian Hy M.D.   On: 09/20/2016 23:06    STUDIES:  Echo 8/11 >> EF 25 to 30%, mild AR, mild MR, PAS 53 mmHg  CULTURES: Sputum 8/17 >> negative Blood 8/17 >> negative  Urine 8/19 >> negative  ANTIBIOTICS: vanc 8/17>>>8/20 cefepime 8/17>>>8/23  SIGNIFICANT EVENTS: 8/17 Admit 8/20 Pressure support weaning trial 8/24 Developed AFwRVR, decompensated CHF/pulmonary edema on BiPAP   LINES/TUBES: ETT 8/17 >> Lt PICC 8/21 >>  DISCUSSION: 76 year old with dementia, recent infected groin wound with staph, recent surgery on left groin for femoral pseudoaneurysm admitted with HCAP, a fib with RVR.  ASSESSMENT / PLAN:  PULMONARY A: Acute hypoxic respiratory failure 2nd to  combination of HCAP and acute pulmonary edema. P:   BiPAP PRN for SOB / increased WOB  O2 to support saturations > 92% Hold further lasix 8/24 Intermittent CXR  CARDIOVASCULAR A:  Acute on chronic systolic CHF. Severe PAD s/p repair of Lt femoral pseudoaneurysm. A fib with RVR - RVR resolved, intermittent AF Sinus pauses 2nd to meds (precedex) >> resolved. Hx of HLD. Difficult IV access. P:  Wean Nipride gtt to off  D/C clonidine Increase lopressor to 100 mg BID  Begin Norvasc 5 mg QD  Heparin gtt per pharmacy  Continue lipitor & ASA Tele monitoring  May need Cardiology input for rate control issues > consider amiodarone for rhythm control   RENAL A:   Stage 3 CKD (baseline cr 1.28 to 1.5). Hypokalemia, hypomagnesemia. Hypermagnesemia - due to replacement  P:   Trend BMP / urinary output Replace electrolytes as indicated Avoid nephrotoxic agents, ensure adequate renal perfusion Continue free water until ok for PO's  Monitor magnesium   GASTROINTESTINAL A:  Severe protein calorie malnutrition w/ cachexia.  Pharyngeal Dysphagia  P:   NPO  SLP evaluation, appreciate input  Continue TF   HEMATOLOGIC A:   Anemia of chronic disease.  P:  Trend CBC  Transfuse per ICU guidelines  INFECTIOUS A:   HCAP. P:   Monitor off abx  Trend fever curve / WBC  ENDOCRINE A:   Diabetes w/ hyperglycemia. Hx of Gout. P:  SSI  Continue Allopurinol   NEUROLOGIC A:   Acute metabolic encephalopathy. Hx of dementia, seizures, CVA with Rt sided weakness >> bed bound. P:   Continue Aricept, Keppra, Zoloft Hold home Remeron, Oxycodone  Frequent orientation, PT efforts    Family - none available on 8/24 rounds.    Noe Gens, NP-C [facility] Pgr: [phone] or if no answer [phone] 09/21/2016, 9:06 AM

## 2016-09-21 NOTE — Consult Note (Signed)
Cardiology Consultation:   Patient ID: SIARAH DELEO; [id number]; [birth year]   Admit date: 09/14/2016 Date of Consult: 09/21/2016  Primary Care Provider: Gildardo Cranker, DO Primary Cardiologist: was Dr Mare Ferrari ([place] '14)   Patient Profile:   Casey [name] is a 76 y.o. female with a hx of CAF-(not anticoagulated), prior CVA,  dementia, and cardiomyopathy who is being seen today for the evaluation of NSVT, AF with RVR, and CHF at the request of Dr Halford Chessman.  History of Present Illness:   Ms. Rodenbaugh has CAF and dementia. She is a SNF pt and has been bed bound. She is a full code. Her last echo in 2015 showed her EF to be 35-40%. Her LOV was 2014. Earlier this month she apparently had a spontaneous Lt FA pseudoaneurysm and required surgery 08/30/16. She was discharged back to [facility] 09/02/16 and re admitted 09/07/16-09/09/16 with wound infection. She then presented with respiratory failure, HCAP requiring intubation 09/14/16. Since admission she has since been extubated. She has had AF with RVR and early this am had 20 beats of NSVT. She also has had CHF. Her echo 09/08/16 shows her EF to be 25-30%. On exam she is frail, debilitated but awake-on bi pap. She knew who Dr Mare Ferrari was but otherwise was unable to provide any history.   Past Medical History:  Diagnosis Date  . Abdominal or pelvic swelling, mass, or lump, left upper quadrant   . CHF (congestive heart failure) ([place])   . Chronic atrial fibrillation ([place])   . Chronic kidney disease   . Congestive heart failure, unspecified   . Coronary atherosclerosis of unspecified type of vessel, native or graft   . Diabetes mellitus   . Edema   . GERD (gastroesophageal reflux disease)   . Gout, unspecified   . Hypercholesterolemia   . Hypertension   . 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  .  Peripheral vascular disease, unspecified ([place])   . Seizures ([place])   . Sinus of Valsalva aneurysm    a. By 2D echo 05/2011.  . Stroke ([place])   . Unspecified hereditary and idiopathic peripheral neuropathy   . Unspecified urinary incontinence   . Vascular dementia, uncomplicated     Past Surgical History:  Procedure Laterality Date  . AORTOGRAM  09/01/2008   Abd w/ bilateral lower extremity runoff arteriography  . APPLICATION OF WOUND VAC Left 08/30/2016   Procedure: APPLICATION OF WOUND VAC LEFT GROIN;  Surgeon: Serafina Mitchell, MD;  Location: [place];  Service: Vascular;  Laterality: Left;  . ENDARTERECTOMY FEMORAL Left 08/30/2016   Procedure: LEFT COMMON FEMORAL ARTERY ENDARTERECTOMY;  Surgeon: Serafina Mitchell, MD;  Location: [place];  Service: Vascular;  Laterality: Left;  . FALSE ANEURYSM REPAIR  12/06/2011   Procedure: REPAIR FALSE ANEURYSM;  Surgeon: Angelia Mould, MD;  Location: [facility] OR;  Service: Vascular;  Laterality: Left;  Repair of left femoral Artery pseudoaneurysm  . FALSE ANEURYSM REPAIR Left 08/30/2016   Procedure: REPAIR PSEUDOANEURYSM LEFT GROIN;  Surgeon: Serafina Mitchell, MD;  Location: [place];  Service: Vascular;  Laterality: Left;  . FEMORAL ARTERY EXPLORATION  12/06/2011   Procedure: FEMORAL ARTERY EXPLORATION;  Surgeon: Angelia Mould, MD;  Location: [facility] OR;  Service: Vascular;  Laterality: N/A;  Exploration of large pseudoaneurysm left side of fem-fem bypass graft   . femoral to  femoral bypass graft     . FEMORAL-FEMORAL BYPASS GRAFT  12/06/2011   Procedure: BYPASS GRAFT FEMORAL-FEMORAL ARTERY;  Surgeon: Angelia Mould, MD;  Location: [facility] OR;  Service: Vascular;  Laterality: Bilateral;  Revision of left to right Femoral-Femoral bypass graft  . FEMORAL-FEMORAL BYPASS GRAFT Left 08/30/2016   Procedure: REDO RIGHT TO LEFT FEMORAL-FEMORAL ARTERY BYPASS GRAFT;  Surgeon: Serafina Mitchell, MD;  Location: [place];  Service: Vascular;  Laterality: Left;  . FEMORAL-POPLITEAL  BYPASS GRAFT Left 05/2002   lower extremity femoral to below knee w/ non-versed greater saphenous vein  . FEMORAL-POPLITEAL BYPASS GRAFT Left 11/2002  . FEMORAL-POPLITEAL BYPASS GRAFT Left 08/30/2016   Procedure: REVISION OF LEFT FEMORAL-POPLITEAL ARTERY BYPASS GRAFT;  Surgeon: Serafina Mitchell, MD;  Location: [place];  Service: Vascular;  Laterality: Left;  . THROMBECTOMY FEMORAL ARTERY Left 08/30/2016   Procedure: THROMBECTOMY OF FEMORAL-FEMORAL ARTERY BYPASS GRAFT AND LEFT FEMORAL TO LEFT POPLITEAL BYPASS GRAFT;  Surgeon: Serafina Mitchell, MD;  Location: [place];  Service: Vascular;  Laterality: Left;  . TUBAL LIGATION Bilateral      Home Medications:  Prior to Admission medications   Medication Sig Start Date End Date Taking? Authorizing Provider  acetaminophen (TYLENOL) 325 MG tablet Take 2 tablets (650 mg total) by mouth every 6 (six) hours as needed for mild pain (or Fever >/= 101). 09/02/16  Yes Osei-Bonsu, Iona Beard, MD  allopurinol (ZYLOPRIM) 100 MG tablet Take 100 mg by mouth daily.   Yes [provider]  aspirin 81 MG tablet Take 81 mg by mouth daily.   Yes [provider]  atorvastatin (LIPITOR) 10 MG tablet Take 10 mg by mouth at bedtime.    Yes [provider]  bacitracin (BACIGUENT) 500 UNIT/GM ointment Apply to Right Hallux topically every day shift for 10 days for Right Ingrown Toenail 09/12/16 09/21/16 Yes [provider]  bisacodyl (BISCOLAX) 10 MG suppository Place 10 mg rectally as needed for moderate constipation.   Yes [provider]  CETYLPYRIDINIUM CHLORIDE MT Give 15 ml by mouth two times a day for oral irritation   Yes [provider]  donepezil (ARICEPT) 10 MG tablet Take 1 tablet (10 mg total) by mouth at bedtime. 02/07/13  Yes Isaac Bliss, Rayford Halsted, MD  guaiFENesin-dextromethorphan (ROBITUSSIN DM) 100-10 MG/5ML syrup Take 15 mLs by mouth every 4 (four) hours as needed for cough. 09/02/16  Yes Osei-Bonsu, Iona Beard, MD    levETIRAcetam (KEPPRA) 250 MG tablet Take 250 mg by mouth 2 (two) times daily.   Yes [provider]  metoprolol tartrate (LOPRESSOR) 25 MG tablet Give 2 tablets (21m) by mouth in the morning and 1 tablet by mouth at bedtime   Yes [provider]  mirtazapine (REMERON) 7.5 MG tablet Take 7.5 mg by mouth at bedtime.   Yes [provider]  oxyCODONE (OXY IR/ROXICODONE) 5 MG immediate release tablet Take 1 tablet (5 mg total) by mouth every 6 (six) hours as needed for severe pain. 09/09/16  Yes Dhungel, Nishant, MD  pantoprazole (PROTONIX) 40 MG tablet Take 40 mg by mouth daily.   Yes [provider]  sertraline (ZOLOFT) 50 MG tablet Take 50 mg by mouth at bedtime.    Yes [provider]    Inpatient Medications: Scheduled Meds: . allopurinol  100 mg Per Tube Daily  . amLODipine  5 mg Per Tube Daily  . aspirin  81 mg Per Tube Daily  . atorvastatin  10 mg Per Tube QHS  .  bacitracin  1 application Topical BID  . chlorhexidine gluconate (MEDLINE KIT)  15 mL Mouth Rinse BID  . Chlorhexidine Gluconate Cloth  6 each Topical Daily  . donepezil  10 mg Per Tube QHS  . feeding supplement (JEVITY 1.2 CAL)  1,000 mL Per Tube Q24H  . feeding supplement (PRO-STAT SUGAR FREE 64)  30 mL Per Tube BID  . free water  100 mL Per Tube Q4H  . insulin aspart  0-15 Units Subcutaneous Q4H  . lactobacillus  1 g Per Tube TID WC  . levETIRAcetam  250 mg Per Tube BID  . mouth rinse  15 mL Mouth Rinse QID  . metoprolol tartrate  100 mg Per Tube BID  . multivitamin  15 mL Per Tube Daily  . pantoprazole sodium  40 mg Per Tube Q24H  . sertraline  50 mg Per Tube QHS  . sodium chloride flush  10-40 mL Intracatheter Q12H  . warfarin  6 mg Oral ONCE-1800  . Warfarin - Pharmacist Dosing Inpatient   Does not apply q1800   Continuous Infusions: . heparin 600 Units/hr (09/21/16 0600)  . nitroPRUSSide 0.4 mcg/kg/min (09/21/16 0939)   PRN Meds: acetaminophen, bisacodyl,  glycopyrrolate, ipratropium-albuterol, sodium chloride flush  Allergies:    Allergies  Allergen Reactions  . Penicillins Other (See Comments)    Told by a doctor to "not take" and on MAR as an allergy Has patient had a PCN reaction causing immediate rash, facial/tongue/throat swelling, SOB or lightheadedness with hypotension: Unknown Has patient had a PCN reaction causing severe rash involving mucus membranes or skin necrosis: Unknown Has patient had a PCN reaction that required hospitalization: Unknown Has patient had a PCN reaction occurring within the last 10 years: Unknown If all of the above answers are "NO", then may proceed with Ceph    Social History:   Social History   Social History  . Marital status: Widowed    Spouse name: N/A  . Number of children: N/A  . Years of education: N/A   Occupational History  . Not on file.   Social History Main Topics  . Smoking status: Former Smoker    Types: Cigarettes    Quit date: 01/29/2001  . Smokeless tobacco: Never Used  . Alcohol use No  . Drug use: No  . Sexual activity: No   Other Topics Concern  . Not on file   Social History Narrative  . No narrative on file    Family History:    Family History  Problem Relation Age of Onset  . Cancer Mother        Cervical  . Stroke Mother   . Diabetes Mother   . Stroke Father   . Heart disease Father      ROS:  Please see the history of present illness.  ROS  All other ROS reviewed and negative.     Physical Exam/Data:   Vitals:   09/21/16 0600 09/21/16 0800 09/21/16 0829 09/21/16 0956  BP: 134/71   (!) 144/68  Pulse:   81 82  Resp: (!) 22  (!) 23   Temp:  (!) 97.3 F (36.3 C)    TempSrc:  Axillary    SpO2: 99%  100%   Weight:      Height:        Intake/Output Summary (Last 24 hours) at 09/21/16 1010 Last data filed at 09/21/16 0819  Gross per 24 hour  Intake          1196.02 ml  Output             2515 ml  Net         -1318.98 ml   Filed Weights    09/19/16 0414 09/20/16 0400 09/21/16 0320  Weight: 142 lb 3.2 oz (64.5 kg) 142 lb 3.2 oz (64.5 kg) 138 lb 14.2 oz (63 kg)   Body mass index is 24.6 kg/m.  General:  Chronically debilitated frail AA female, on Bi pap HEENT: normal Lymph: no adenopathy Neck: JVD  Noted Cardiac:  irregularly irregular rhythm with no obvious murmur Lungs:  decreased breath sounds at bases Abd: soft, nontender, no hepatomegaly  Ext: recent Lt FA surgical site wound appears intact, no sign of infection or drainage, She has an old Rt FA surgical scar with bruit Skin: cool, moist Neuro: she is awake and alert  EKG:   EKG 09/14/16 was personally reviewed and demonstrates:  AF with LBBB Telemetry:  Telemetry was personally reviewed and demonstrates:  AF- VR now in the 80's. Runs of NSVT- longest was 20 beats  Relevant CV Studies: Echo 09/08/16- Impressions:  - Severe global reduction in LV systolic function; mild LVH; mild   AI; mild MR; severe biatrial enlargement; mild to moderate TR   with moderately elevated pulmonary pressure.   Laboratory Data:  Chemistry Recent Labs Lab 09/19/16 0235 09/20/16 0252 09/21/16 0447  NA 145 143 139  K 4.1 4.4 3.8  CL 117* 112* 104  CO2 _0 GLUCOSE 149* 148* 237*  BUN 26* 29* 33*  CREATININE 1.34* 1.32* 1.21*  CALCIUM 9.4 9.7 9.9  GFRNONAA 37* 38* 42*  GFRAA 43* 44* 49*  ANIONGAP _1 Recent Labs Lab 09/15/16 0504  PROT 6.1*  ALBUMIN 2.6*  AST 27  ALT 14  ALKPHOS 69  BILITOT 0.8   Hematology Recent Labs Lab 09/19/16 0918 09/20/16 0252 09/21/16 0447  WBC 10.3 11.9* 12.9*  RBC 2.96* 3.32* 3.33*  HGB 8.1* 9.0* 8.9*  HCT 24.6* 28.3* 27.9*  MCV 83.1 85.2 83.8  MCH 27.4 27.1 26.7  MCHC 32.9 31.8 31.9  RDW 17.3* 17.3* 16.9*  PLT 251 225 277   Cardiac Enzymes Recent Labs Lab 09/14/16 1050 09/14/16 1423  TROPONINI 0.07* 0.07*   No results for input(s): TROPIPOC in the last 168 hours.  BNP Recent Labs Lab 09/14/16 1050    BNP 932.9*    DDimer No results for input(s): DDIMER in the last 168 hours.  Radiology/Studies:  Dg Abd 1 View  Result Date: 09/20/2016 CLINICAL DATA:  Evaluate NG tube EXAM: ABDOMEN - 1 VIEW COMPARISON:  Abdomen radiograph 09/19/2016 FINDINGS: Enteric tube tip projects over the left upper quadrant, feeding tube projects over the stomach. Mildly gaseous distended stomach. Nonobstructed bowel gas pattern. Lumbar spine degenerative changes. Cardiomegaly. Basilar atelectasis. Stent projects over the left hemipelvis. Bilateral lower abdominal surgical clips. IMPRESSION: Feeding tube within the mid stomach. Electronically Signed   By: Lovey Newcomer M.D.   On: 09/20/2016 15:38   Dg Abd 1 View  Result Date: 09/19/2016 CLINICAL DATA:  Pain and a placement EXAM: ABDOMEN - 1 VIEW COMPARISON:  09/14/2016 FINDINGS: Soft feeding tube enters the abdomen with its tip in the midportion of the stomach. Bowel gas pattern appears normal. IMPRESSION: Feeding tube tip in the midportion of the stomach. Electronically Signed   By: Nelson Chimes M.D.   On: 09/19/2016 11:17   Dg Chest Port 1 View  Result Date: 09/21/2016 CLINICAL DATA:  Pneumonia. EXAM: PORTABLE CHEST 1 VIEW COMPARISON:  09/20/2016. FINDINGS: Feeding tube, left PICC line in stable position. Cardiomegaly with mild bilateral interstitial prominence and pleural effusions consistent with congestive heart failure. Similar findings on prior exam. Basilar atelectasis. No pneumothorax. IMPRESSION: 1. Lines and tubes in stable position. 2. Cardiomegaly with diffuse mild bilateral interstitial prominence and bilateral pleural effusions consistent with congestive heart failure. Similar findings noted on prior exam. 3. Basilar atelectasis . Electronically Signed   By: Marcello Moores  Register   On: 09/21/2016 06:23   Dg Chest Port 1 View  Result Date: 09/20/2016 CLINICAL DATA:  Dyspnea EXAM: PORTABLE CHEST 1 VIEW COMPARISON:  09/19/2016 FINDINGS: Multifocal perihilar and  bilateral lower lobe opacities, suspicious for moderate interstitial edema, less likely multifocal pneumonia. Associated small bilateral pleural effusions, right greater than left. Overall appearance is likely mildly progressed. Cardiomegaly. Left arm PICC terminates at the cavoatrial junction. Prior enteric tube has been replaced with a feeding tube which courses into the stomach. IMPRESSION: Suspected moderate interstitial edema, mildly progressed. Small bilateral pleural effusions, right greater than left. Electronically Signed   By: Julian Hy M.D.   On: 09/20/2016 23:06   Dg Chest Port 1 View  Result Date: 09/19/2016 CLINICAL DATA:  Respiratory failure. EXAM: PORTABLE CHEST 1 VIEW COMPARISON:  09/18/2016. FINDINGS: Endotracheal tube, NG tube, left PICC line stable position . Cardiomegaly with slight increase in basilar infiltrates and small pleural effusions consistent with slight worsening of CHF. No pneumothorax. IMPRESSION: 1.  Lines and tubes in stable position. 2. Cardiomegaly with slight increase in basilar infiltrates and small pleural effusions consistent with slight worsening of CHF. Electronically Signed   By: Marcello Moores  Register   On: 09/19/2016 06:21   Dg Chest Port 1 View  Result Date: 09/18/2016 CLINICAL DATA:  PICC line placement. EXAM: PORTABLE CHEST 1 VIEW COMPARISON:  09/18/2016. FINDINGS: PICC line noted with its tip projected over the superior vena cava. Endotracheal tube and NG tube in stable position. Cardiomegaly with bilateral mild interstitial prominence and small pleural effusions consistent CHF. Mild basilar atelectasis.Slight improvement from prior exam. IMPRESSION: 1.  Left PICC line in stable position. 2.  Endotracheal tube and NG tube in stable position. 3.  Mild from prior exam . Electronically Signed   By: Marcello Moores  Register   On: 09/18/2016 12:54   Dg Chest Port 1 View  Result Date: 09/18/2016 CLINICAL DATA:  Respiratory failure, history of lymphoma, CHF,  peripheral vascular disease, current smoker. EXAM: PORTABLE CHEST 1 VIEW COMPARISON:  Portable chest x-ray of September 17, 2016 FINDINGS: The trachea remains intubated with the tip of the tube lying 2.5 cm above the carina. The esophagogastric tube tip projects below the inferior margin of the image. The lungs are well-expanded. The lung markings are coarse in the lower lobes. A small right pleural effusion likely layers posteriorly. The cardiac silhouette remains enlarged. The pulmonary vascularity is minimally prominent. There is calcification in the wall of the thoracic aorta. IMPRESSION: COPD. Probable subsegmental atelectasis or infiltrate in the lower lobes without discrete air bronchograms. Reasonable positioning of the endotracheal tube. Mild cardiomegaly with mild pulmonary vascular congestion. Thoracic aortic atherosclerosis. Electronically Signed   By: David  Martinique M.D.   On: 09/18/2016 07:09    Assessment and Plan:   AF with RVR- Pt has CAF, rates have been uncontrolled in setting of HCAP, respiratory failure. Rates much better this am after Metoprolol resumed at increased dose  Acute on chronic systolic CHF- She diuresed 1L after one dose of  IV Lasix 40 mg yesterday, she may need further diuresis  Cardiomyopathy- EF down from 2015- now 25%  Dementia- Bed bound SNF pt- full code  H/O CVA- Pt was on Coumadin back in 2014 but not noted on recent admissions-suspect she was too high risk in the past. Coumadin has been resumed by CCM  PVD- H/O recent Lt FA pseudoaneurysm repair 08/30/16  Plan: MD to see. NSVT not a good prognostic sign with her EF of 25%. She does not appear to be a candidate for and ICD or aggressive therapies.   Plan medical RX for AF with RVR and CHF. Transient hypotension has been an issue and she has CRI-3 so probably not an ACE/ARB candidate. Agree with beta blocker as tolerated.    Angelena Form, PA-C  09/21/2016 10:10 AM

## 2016-09-21 NOTE — Progress Notes (Addendum)
Inpatient Diabetes Program Recommendations  AACE/ADA: New Consensus Statement on Inpatient Glycemic Control (2015)  Target Ranges:  Prepandial:   less than 140 mg/dL      Peak postprandial:   less than 180 mg/dL (1-2 hours)      Critically ill patients:  140 - 180 mg/dL   Results for AIDAN, CALOCA (MRN [id number]) as of 09/21/2016 11:48  Ref. Range 09/20/2016 23:38 09/21/2016 04:41 09/21/2016 08:12 09/21/2016 11:33  Glucose-Capillary Latest Ref Range: 65 - 99 mg/dL 241 (H) 212 (H) 188 (H) 250 (H)    Current Insulin Orders: Novolog Moderate Correction Scale/ SSI (0-15 units) Q4 hours      MD- Note patient receiving Jevity tube feeds at 40cc/hr.  CBGs consistently elevated since midnight.  Please consider starting Novolog tube feed coverage: Novolog 3 units Q4 hours (hold if tube feeds held for any reason)      --Will follow patient during hospitalization--  Wyn Quaker RN, MSN, CDE Diabetes Coordinator Inpatient Glycemic Control Team Team Pager: [phone] (8a-5p)

## 2016-09-21 NOTE — Progress Notes (Signed)
ANTICOAGULATION CONSULT NOTE - Follow Up Consult  Please see previous progress note Bonne Dolores, PharmD.  Pharmacy dosing IV heparin for atrial fibrillation.  Heparin level this afternoon returns at 0.42 (therapeutic) on 600 units/hr.  Per discussion with RN, no infusion or bleeding.  Plan: -continue heparin drip at 600 unit/shr - Recheck heparin level with am labs (confirmatory)   Dorrene German 09/21/2016 12:38 AM

## 2016-09-21 NOTE — Progress Notes (Signed)
OT Cancellation Note  Patient Details Name: Casey [name] MRN: [id number] DOB: Nov 15, 1940   Cancelled Treatment:    Reason Eval/Treat Not Completed: Other (comment). Pt is a LTC resident from snf; will defer OT evaluation to that venue.  Teasha Murrillo 09/21/2016, 1:11 PM  Lesle Chris, OTR/L [phone] 09/21/2016

## 2016-09-21 NOTE — Care Management Note (Signed)
Case Management Note  Patient Details  Name: Casey [name] MRN: [id number] Date of Birth: 27-Dec-1940  Subjective/Objective:    Extubated to Bipap                Action/Plan: Date:  September 21, 2016 Chart reviewed for concurrent status and case management needs. Will continue to follow patient progress. Discharge Planning: following for needs Expected discharge date: [id number] Velva Harman, BSN, [place], [place]  Expected Discharge Date:   (UNKNOWN)               Expected Discharge Plan:  [place]  In-House Referral:  Clinical Social Work  Discharge planning Services  CM Consult  Post Acute Care Choice:    Choice offered to:     DME Arranged:    DME Agency:     HH Arranged:    [place] Agency:     Status of Service:  In process, will continue to follow  If discussed at Long Length of Stay Meetings, dates discussed:    Additional Comments:  [name], RN 09/21/2016, 8:52 AM

## 2016-09-22 ENCOUNTER — Inpatient Hospital Stay (HOSPITAL_COMMUNITY): Payer: Medicare Other

## 2016-09-22 ENCOUNTER — Encounter (HOSPITAL_COMMUNITY): Payer: Self-pay

## 2016-09-22 DIAGNOSIS — J9601 Acute respiratory failure with hypoxia: Secondary | ICD-10-CM | POA: Diagnosis present

## 2016-09-22 LAB — GLUCOSE, CAPILLARY
GLUCOSE-CAPILLARY: 177 mg/dL — AB (ref 65–99)
Glucose-Capillary: 148 mg/dL — ABNORMAL HIGH (ref 65–99)
Glucose-Capillary: 285 mg/dL — ABNORMAL HIGH (ref 65–99)
Glucose-Capillary: 148 mg/dL — ABNORMAL HIGH (ref 65–99)

## 2016-09-22 LAB — BASIC METABOLIC PANEL
ANION GAP: 7 (ref 5–15)
BUN: 37 mg/dL — ABNORMAL HIGH (ref 6–20)
CALCIUM: 9.9 mg/dL (ref 8.9–10.3)
CO2: 31 mmol/L (ref 22–32)
Chloride: 104 mmol/L (ref 101–111)
Creatinine, Ser: 1.32 mg/dL — ABNORMAL HIGH (ref 0.44–1.00)
GFR calc Af Amer: 44 mL/min — ABNORMAL LOW (ref >60)
GFR calc non Af Amer: 38 mL/min — ABNORMAL LOW (ref >60)
GLUCOSE: 132 mg/dL — AB (ref 65–99)
Potassium: 3.5 mmol/L (ref 3.5–5.1)
Sodium: 142 mmol/L (ref 135–145)

## 2016-09-22 LAB — MAGNESIUM: Magnesium: 2.4 mg/dL (ref 1.7–2.4)

## 2016-09-22 LAB — CBC
HCT: 27.4 % — ABNORMAL LOW (ref 36.0–46.0)
Hemoglobin: 8.7 g/dL — ABNORMAL LOW (ref 12.0–15.0)
MCH: 26.2 pg (ref 26.0–34.0)
MCHC: 31.8 g/dL (ref 30.0–36.0)
MCV: 82.5 fL (ref 78.0–100.0)
PLATELETS: 284 10*3/uL (ref 150–400)
RBC: 3.32 MIL/uL — AB (ref 3.87–5.11)
RDW: 16.9 % — ABNORMAL HIGH (ref 11.5–15.5)
WBC: 9.9 10*3/uL (ref 4.0–10.5)

## 2016-09-22 LAB — HEPARIN LEVEL (UNFRACTIONATED)
Heparin Unfractionated: 0.16 IU/mL — ABNORMAL LOW (ref 0.30–0.70)
Heparin Unfractionated: 0.19 IU/mL — ABNORMAL LOW (ref 0.30–0.70)

## 2016-09-22 LAB — PROTIME-INR
INR: 1.41
Prothrombin Time: 17.4 seconds — ABNORMAL HIGH (ref 11.4–15.2)

## 2016-09-22 MED ORDER — HEPARIN (PORCINE) IN NACL 100-0.45 UNIT/ML-% IJ SOLN
700.0000 [IU]/h | INTRAMUSCULAR | Status: DC
  Administered 2016-09-22: 700 [IU]/h via INTRAVENOUS

## 2016-09-22 MED ORDER — WARFARIN SODIUM 6 MG PO TABS
6.0000 mg | ORAL_TABLET | Freq: Once | ORAL | Status: AC
  Filled 2016-09-22: qty 1

## 2016-09-22 NOTE — Progress Notes (Signed)
Progress Note  Patient Name: Casey [name] Date of Encounter: 09/22/2016  Primary Cardiologist: Dr. Radford Pax  Subjective   She denies any pain or SOB.  Responds to questions.   Inpatient Medications    Scheduled Meds: . allopurinol  100 mg Per Tube Daily  . aspirin  81 mg Per Tube Daily  . atorvastatin  10 mg Per Tube QHS  . bacitracin  1 application Topical BID  . chlorhexidine gluconate (MEDLINE KIT)  15 mL Mouth Rinse BID  . Chlorhexidine Gluconate Cloth  6 each Topical Daily  . donepezil  10 mg Per Tube QHS  . feeding supplement (JEVITY 1.2 CAL)  1,000 mL Per Tube Q24H  . feeding supplement (PRO-STAT SUGAR FREE 64)  30 mL Per Tube BID  . free water  100 mL Per Tube Q4H  . furosemide  40 mg Intravenous BID  . insulin aspart  0-15 Units Subcutaneous Q4H  . lactobacillus  1 g Per Tube TID WC  . levETIRAcetam  250 mg Per Tube BID  . losartan  25 mg Per NG tube Daily  . mouth rinse  15 mL Mouth Rinse QID  . metoprolol tartrate  100 mg Per Tube BID  . multivitamin  15 mL Per Tube Daily  . pantoprazole sodium  40 mg Per Tube Q24H  . sertraline  50 mg Per Tube QHS  . sodium chloride flush  10-40 mL Intracatheter Q12H  . warfarin  6 mg Oral ONCE-1800  . Warfarin - Pharmacist Dosing Inpatient   Does not apply q1800   Continuous Infusions: . heparin 600 Units/hr (09/22/16 0300)   PRN Meds: acetaminophen, bisacodyl, glycopyrrolate, ipratropium-albuterol, sodium chloride flush   Vital Signs    Vitals:   09/22/16 0412 09/22/16 0500 09/22/16 0600 09/22/16 0700  BP:    130/70  Pulse:      Resp:  (!) 29 (!) 29 (!) 28  Temp: 98.5 F (36.9 C)     TempSrc: Oral     SpO2:  99% 98% 97%  Weight:      Height:        Intake/Output Summary (Last 24 hours) at 09/22/16 0743 Last data filed at 09/22/16 0300  Gross per 24 hour  Intake             1558 ml  Output              730 ml  Net              828 ml   Filed Weights   09/20/16 0400 09/21/16 0320 09/22/16 0400    Weight: 142 lb 3.2 oz (64.5 kg) 138 lb 14.2 oz (63 kg) 135 lb 9.3 oz (61.5 kg)    Telemetry    Atrial fib.  Wide complex NST probably aberrant conduction - Personally Reviewed  ECG    N - Personally Reviewed  Physical Exam   GEN: No acute distress.  Frail Neck: No  JVD Cardiac: IrregularRR, no murmurs, rubs, or gallops.  Respiratory: Clear  to auscultation bilaterally. GI: Soft, nontender, non-distended  MS: No  edema; No deformity. Neuro:  Nonfocal  Psych: Normal affect   Labs    Chemistry Recent Labs Lab 09/20/16 0252 09/21/16 0447 09/22/16 0500  NA 143 139 142  K 4.4 3.8 3.5  CL 112* 104 104  CO2 _0 GLUCOSE 148* 237* 132*  BUN 29* 33* 37*  CREATININE 1.32* 1.21* 1.32*  CALCIUM 9.7 9.9 9.9  GFRNONAA  38* 42* 38*  GFRAA 44* 49* 44*  ANIONGAP 7 7 7     Hematology Recent Labs Lab 09/20/16 0252 09/21/16 0447 09/22/16 0500  WBC 11.9* 12.9* 9.9  RBC 3.32* 3.33* 3.32*  HGB 9.0* 8.9* 8.7*  HCT 28.3* 27.9* 27.4*  MCV 85.2 83.8 82.5  MCH 27.1 26.7 26.2  MCHC 31.8 31.9 31.8  RDW 17.3* 16.9* 16.9*  PLT 225 277 284    Cardiac EnzymesNo results for input(s): TROPONINI in the last 168 hours. No results for input(s): TROPIPOC in the last 168 hours.   BNPNo results for input(s): BNP, PROBNP in the last 168 hours.   DDimer No results for input(s): DDIMER in the last 168 hours.   Radiology    Dg Abd 1 View  Result Date: 09/21/2016 CLINICAL DATA:  Nasogastric tube placement. EXAM: ABDOMEN - 1 VIEW COMPARISON:  Yesterday. FINDINGS: Feeding tube tip with its tip in scratch sec peak 2 tip in the mid stomach. No a nasogastric tube is seen. Normal bowel gas pattern. Left iliac stent. Lumbar spine degenerative changes. Small to moderate-sized right pleural effusion, increased. Interval small left pleural effusion. IMPRESSION: 1. Feeding tube tip in the mid stomach. 2. No nasogastric tube seen. 3. Small moderate-sized right pleural effusion and small left pleural  effusion. Electronically Signed   By: Steven  Reid M.D.   On: 09/21/2016 20:49   Dg Abd 1 View  Result Date: 09/20/2016 CLINICAL DATA:  Evaluate NG tube EXAM: ABDOMEN - 1 VIEW COMPARISON:  Abdomen radiograph 09/19/2016 FINDINGS: Enteric tube tip projects over the left upper quadrant, feeding tube projects over the stomach. Mildly gaseous distended stomach. Nonobstructed bowel gas pattern. Lumbar spine degenerative changes. Cardiomegaly. Basilar atelectasis. Stent projects over the left hemipelvis. Bilateral lower abdominal surgical clips. IMPRESSION: Feeding tube within the mid stomach. Electronically Signed   By: Drew  Davis M.D.   On: 09/20/2016 15:38   Dg Chest Port 1 View  Result Date: 09/21/2016 CLINICAL DATA:  Pneumonia. EXAM: PORTABLE CHEST 1 VIEW COMPARISON:  09/20/2016. FINDINGS: Feeding tube, left PICC line in stable position. Cardiomegaly with mild bilateral interstitial prominence and pleural effusions consistent with congestive heart failure. Similar findings on prior exam. Basilar atelectasis. No pneumothorax. IMPRESSION: 1. Lines and tubes in stable position. 2. Cardiomegaly with diffuse mild bilateral interstitial prominence and bilateral pleural effusions consistent with congestive heart failure. Similar findings noted on prior exam. 3. Basilar atelectasis . Electronically Signed   By: Thomas  Register   On: 09/21/2016 06:23   Dg Chest Port 1 View  Result Date: 09/20/2016 CLINICAL DATA:  Dyspnea EXAM: PORTABLE CHEST 1 VIEW COMPARISON:  09/19/2016 FINDINGS: Multifocal perihilar and bilateral lower lobe opacities, suspicious for moderate interstitial edema, less likely multifocal pneumonia. Associated small bilateral pleural effusions, right greater than left. Overall appearance is likely mildly progressed. Cardiomegaly. Left arm PICC terminates at the cavoatrial junction. Prior enteric tube has been replaced with a feeding tube which courses into the stomach. IMPRESSION: Suspected  moderate interstitial edema, mildly progressed. Small bilateral pleural effusions, right greater than left. Electronically Signed   By: Sriyesh  Krishnan M.D.   On: 09/20/2016 23:06    Cardiac Studies   Echo 8/11 - Left ventricle: The cavity size was normal. Wall thickness was   increased in a pattern of mild LVH. Systolic function was   severely reduced. The estimated ejection fraction was in the   range of 25% to 30%. Diffuse hypokinesis. - Aortic valve: There was mild regurgitation. - Mitral valve: There   was mild regurgitation. - Left atrium: The atrium was severely dilated. - Right atrium: The atrium was severely dilated. - Tricuspid valve: There was moderate regurgitation. - Pulmonary arteries: Systolic pressure was moderately increased.   PA peak pressure: 53 mm Hg (S  Patient Profile     76 y.o. female with a hx of CAF-(not anticoagulated), prior CVA,  dementia, and cardiomyopathy who is being seen for the evaluation of NSVT, AF with RVR, and CHF at the request of Dr Halford Chessman.  Assessment & Plan    ATRIAL FIB:  Rate is well controlled.  Currently on heparin.  She was not felt to be a warfarin candidate in the past secondary to high fall risk.  Ms. OLUWATOMISIN HUSTEAD has a CHA2DS2 - VASc score of 8.     AC ON CHRONIC SYSTOLIC HF:  Tolerating the med change and I will likely increase Losartan in the AM.  Continue current diuresis today.    Signed, Minus Breeding, MD  09/22/2016, 7:43 AM

## 2016-09-22 NOTE — Progress Notes (Signed)
Modified Barium Swallow Progress Note  Patient Details  Name: Casey [name] MRN: [id number] Date of Birth: Mar 03, 1940  Today's Date: 09/22/2016  Modified Barium Swallow completed.  Full report located under Chart Review in the Imaging Section.  Brief recommendations include the following:  Clinical Impression  Patient presents with mild oral, moderate pharyngeal and suspected primary esophageal dysphagia. Deviations in swallow physiology include impaired pharyngeal sensation which is likely multifactorial given pt's intubation, hx of GERD. This resulted in delayed swallow initiation with liquids, which at times did not occur until liquids were already in pt's cervical esophagus (UES open at rest, NG present). This resulted in 1 episode of trace, sensed aspiration of thin liquids which ocurred during the swallow. Oral stage was largely functional with the exception of pt's difficulty transiting barium tablet; she was unable to swallow whole or when broken into smaller pieces, though this is likely secondary to esophageal dysphagia, as pt was repeatedly belching and gagging slightly during attempts. Pharyngeal stage characterized by adequate tongue base retraction and hyolaryngeal excursion, reduced pharyngeal stripping wave suspect secondary to suboptimal esophageal pressures. Cervical esophageal phase notable for intermittent backflow into the cervical esophagus and pharynx with thin and nectar thick liquids, regular solids. Frequent wet belching with thin liquids as well as delayed coughing which did not appear to be associated with penetration/aspiration events, however penetration/aspiration of esophageal backflow between frames not excluded. An esophageal sweep with nectar thick liquids revealed a standing column of barium in the upper esophagus, with retrograde bolus flow through the UES into the pharynx. Recommend pt remain NPO with NG at this time; she may benefit from GI referral, esophageal  workup. SLP will f/u for readiness for PO/repeat instrumental pending follow-up for her suspected primary esophageal dysphagia.    Swallow Evaluation Recommendations   Recommended Consults: Consider GI evaluation;Consider esophageal assessment   SLP Diet Recommendations: NPO;Alternative means - temporary       Medication Administration: Via alternative means               Oral Care Recommendations: Oral care QID      Deneise Lever, [place], CCC-SLP Speech-Language Pathologist [phone]  Aliene Altes 09/22/2016,3:41 PM

## 2016-09-22 NOTE — Progress Notes (Signed)
PULMONARY / CRITICAL CARE MEDICINE   Name: Casey [name] MRN: [id number] DOB: November 13, 1940    ADMISSION DATE:  09/14/2016 CONSULTATION DATE:  8/17  REFERRING MD:  Rodena Piety   CHIEF COMPLAINT:  Acute respiratory failure   HISTORY OF PRESENT ILLNESS:   76 yo female d/c from [facility] on 09/09/16 after tx for wound infection s/p Lt femoral pseudoaneurysm repair and had Staph aureus in blood culture.  She was d/c to SNF with wound vac and doxycycline.  She has hx of dementia and is bed bound at baseline for past several years.  She also has hx of systolic CHF.  She presented with fever, dyspnea, cough, and body aches.  She was found to have new pulmonary infiltrate with concern for HCAP, A fib with RVR and required intubation.  Extubated 8/22.  SUBJECTIVE:   Transitioned off nipride 8/24.  Denies chest/abdominal pain.  Not needing Bipap.  VITAL SIGNS: BP 130/70   Pulse 84   Temp 98.5 F (36.9 C) (Oral)   Resp (!) 28   Ht 5\' 3"  (1.6 m)   Wt 135 lb 9.3 oz (61.5 kg)   SpO2 97%   BMI 24.02 kg/m   INTAKE / OUTPUT: I/O last 3 completed shifts: In: 2522.7 [I.V.:360.7; Other:120; NG/GT:2042] Out: 2405 [Urine:2405]  PHYSICAL EXAMINATION:  General - pleasant Eyes - pupils reactive ENT - NG tube in place Cardiac - irregular, no murmur Chest - better air movement, no wheeze Abd - soft, non tender Ext - no edema Skin - no rashes Neuro - follows commands  LABS:  BMET  Recent Labs Lab 09/20/16 0252 09/21/16 0447 09/22/16 0500  NA 143 139 142  K 4.4 3.8 3.5  CL 112* 104 104  CO2 24 28 31   BUN 29* 33* 37*  CREATININE 1.32* 1.21* 1.32*  GLUCOSE 148* 237* 132*    Electrolytes  Recent Labs Lab 09/16/16 0406 09/17/16 0337  09/19/16 0235 09/20/16 0252  09/21/16 0447  09/21/16 1528 09/21/16 1803 09/22/16 0500  CALCIUM 9.1 9.1  < > 9.4 9.7  --  9.9  --   --   --  9.9  MG 1.2* 1.1*  < > 1.6* 2.0  < >  --   < > 2.8* 2.8* 2.4  PHOS 3.0 3.4  --  1.9*  --   --   --   --   --    --   --   < > = values in this interval not displayed.  CBC  Recent Labs Lab 09/20/16 0252 09/21/16 0447 09/22/16 0500  WBC 11.9* 12.9* 9.9  HGB 9.0* 8.9* 8.7*  HCT 28.3* 27.9* 27.4*  PLT 225 277 284    Coag's  Recent Labs Lab 09/15/16 1558  09/20/16 0252 09/21/16 0333 09/22/16 0500  APTT 86*  --   --   --   --   INR 1.30  < > 1.25 1.39 1.41  < > = values in this interval not displayed.  Sepsis Markers  Recent Labs Lab 09/16/16 0406  PROCALCITON 0.20   Glucose  Recent Labs Lab 09/21/16 1133 09/21/16 1541 09/21/16 1916 09/21/16 2317 09/22/16 0326 09/22/16 0756  GLUCAP 250* 155* 133* 202* 148* 177*    Imaging Dg Abd 1 View  Result Date: 09/21/2016 CLINICAL DATA:  Nasogastric tube placement. EXAM: ABDOMEN - 1 VIEW COMPARISON:  Yesterday. FINDINGS: Feeding tube tip with its tip in scratch sec peak 2 tip in the mid stomach. No a nasogastric tube is seen. Normal bowel  gas pattern. Left iliac stent. Lumbar spine degenerative changes. Small to moderate-sized right pleural effusion, increased. Interval small left pleural effusion. IMPRESSION: 1. Feeding tube tip in the mid stomach. 2. No nasogastric tube seen. 3. Small moderate-sized right pleural effusion and small left pleural effusion. Electronically Signed   By: Claudie Revering M.D.   On: 09/21/2016 20:49    STUDIES:  Echo 8/11 >> EF 25 to 30%, mild AR, mild MR, PAS 53 mmHg  CULTURES: Sputum 8/17 >> negative Blood 8/17 >> negative  Urine 8/19 >> negative  ANTIBIOTICS: vanc 8/17>>>8/20 cefepime 8/17>>>8/23  SIGNIFICANT EVENTS: 8/17 Admit 8/20 Pressure support weaning trial 8/24 Developed AFwRVR, decompensated CHF/pulmonary edema on BiPAP  8/25 Transfer to tele  LINES/TUBES: ETT 8/17 >> 8/22 Lt PICC 8/21 >>  DISCUSSION: 76 year old with dementia, recent infected groin wound with staph, recent surgery on left groin for femoral pseudoaneurysm admitted with HCAP, a fib with RVR.  ASSESSMENT /  PLAN:  A fib with RVR. Acute on chronic systolic CHF. Hx of HLD, peripheral artery disease. - cardiology consulted - transition from heparin to coumadin - continue lopressor, ASA, lipitor, lasix, cozaar  Acute hypoxic respiratory failure 2nd to HCAP and acute pulmonary edema. - d/c Bipap - oxygen to keep SpO2 > 92%  CKD 3. Hypokalemia, hypomagnesemia. - f/u BMET  Severe protein calorie malnutrition. Dysphagia. - f/u with speech - tube feeds  Anemia of critical illness and chronic disease. - f/u CBC  DM type II. Hx of gout. - SSI - allopurinol  Hx of Dementia, seizures, CVA with Rt sided weakness. - PT/OT - continue aricept, keppra, zoloft  Resolved problems >> Sinus pauses from precedex, HCAP, acute metabolic encephalopathy  DVT prophylaxis - heparin gtt/coumadin SUP - Protonix Nutrition - tube feeds Goals of care - full code  Transfer to tele 8/25 >> [facility] 8/26 and PCCM off  Chesley Mires, MD [place] 09/22/2016, 9:13 AM Pager:  [phone] After 3pm call: [phone]

## 2016-09-22 NOTE — Progress Notes (Signed)
Patient being tranferred out of icu tomorrow.patient admitted with HCAP.Respiratory failure requring intubation.patient recently had pseudo aneurysm repaired complicated by infection.she has CHF,AFIB followed by cards.patient is bed bound at baseline.she will need placement.she has dementia too.

## 2016-09-22 NOTE — Progress Notes (Signed)
ANTICOAGULATION CONSULT NOTE - Follow Up Consult  Pharmacy Consult for Heparin, Warfarin Indication: atrial fibrillation  Allergies  Allergen Reactions  . Penicillins Other (See Comments)    Told by a doctor to "not take" and on MAR as an allergy Has patient had a PCN reaction causing immediate rash, facial/tongue/throat swelling, SOB or lightheadedness with hypotension: Unknown Has patient had a PCN reaction causing severe rash involving mucus membranes or skin necrosis: Unknown Has patient had a PCN reaction that required hospitalization: Unknown Has patient had a PCN reaction occurring within the last 10 years: Unknown If all of the above answers are "NO", then may proceed with Ceph    Patient Measurements: Height: 5\' 3"  (160 cm) Weight: 135 lb 9.3 oz (61.5 kg) IBW/kg (Calculated) : 52.4 Heparin Dosing Weight: 64 kg  Vital Signs: Temp: 98.5 F (36.9 C) (08/25 0412) Temp Source: Oral (08/25 0412) BP: 135/75 (08/25 0400)  Labs:  Recent Labs  09/20/16 0252 09/20/16 1402 09/20/16 2350 09/21/16 0333 09/21/16 0447 09/21/16 0914 09/22/16 0500  HGB 9.0*  --   --   --  8.9*  --  8.7*  HCT 28.3*  --   --   --  27.9*  --  27.4*  PLT 225  --   --   --  277  --  284  LABPROT 15.8*  --   --  17.2*  --   --  17.4*  INR 1.25  --   --  1.39  --   --  1.41  HEPARINUNFRC 0.56 0.30 0.42  --   --  0.34  --   CREATININE 1.32*  --   --   --  1.21*  --  1.32*    Estimated Creatinine Clearance: 30 mL/min (A) (by C-G formula based on SCr of 1.32 mg/dL (H)).   Medications:  Infusions:  . heparin 600 Units/hr (09/22/16 0300)    Assessment: 76 yo female with hx of Afib (NO anticoagulation PTA), dementia, CVA, seizures, bedbound, recent repair of left femoral pseduo aneurysm complictaed with infection at the surgical site treated with doxycycline now admitted with subjective fever cough shortness of breath and generalized body aches. On tele, found to be in Afib with RVR and started on  cardizem drip which was stopped due to hypotension.  Pharmacy is consulted to dose IV heparin and warfarin for anticoagulation for afib.   Today, 8/23  HL= 0.16 units/ml, decreased to subtherapeutic on heparin at 600 units/hr (Heparin infusing through PICC line.  0500 AM labs may have been drawn from PICC line by RN, but then would expect HL to be high.  Current RN and lab cannot confirm where labs were drawn from.)    HL recheck = 0.19, remains subtherapeutic  Hgb low/stable at 8.7, Plt remain WNL   No bleeding or complications reported.  RN reports that she has not seen any pink tinged oral secretions yet today.  Scr 1.32, CrCl 30 ml/min   INR 1.41 subtherapeutic and very little increase after warfarin x3 doses.  Warfarin dose on 8/24 was charted as NOT given because pt had removed NG tube.  NG tube replaced on 8/24, but warfarin dose was still not given.  Goal of Therapy:  INR 2-3 Heparin level 0.3-0.7 units/ml Monitor platelets by anticoagulation protocol: Yes   Plan:   Increase to heparin drip at 700 units/hr  Check heparin level in 8 hours after rate change.  Warfarin 6 mg PO x 1 at 1800  Daily heparin  level, INR, and CBC  Continue to monitor H&H and platelets  Patient will need a minimum of 5 days warfarin / Heparin overlap and until INR > 2 for 24 hours.   Pharmacy will provide warfarin education to family/caretakers if possible.  Pt is not appropriate for education at this time d/t dementia.   Gretta Arab PharmD, BCPS Pager [phone] 09/22/2016 7:38 AM

## 2016-09-22 NOTE — Progress Notes (Signed)
PT Cancellation Note  Patient Details Name: Casey [name] MRN: [id number] DOB: Feb 25, 1940   Cancelled Treatment:    Reason Eval/Treat Not Completed: PT screened, no needs identified, will sign off;  Pt is long term SNF resident, per chart/MD notes pt has been bed bound for years; will defer PT eval to SNF although pt does not appear to be a rehab candidate; per last PT eval pt was +2 assist and unable to follow commands d/t dementia   [facility] 09/22/2016, 9:38 AM

## 2016-09-23 ENCOUNTER — Encounter (HOSPITAL_COMMUNITY): Payer: Self-pay

## 2016-09-23 DIAGNOSIS — R0600 Dyspnea, unspecified: Secondary | ICD-10-CM

## 2016-09-23 LAB — CBC
HEMATOCRIT: 27.3 % — AB (ref 36.0–46.0)
Hemoglobin: 8.8 g/dL — ABNORMAL LOW (ref 12.0–15.0)
MCH: 26.5 pg (ref 26.0–34.0)
MCHC: 32.2 g/dL (ref 30.0–36.0)
MCV: 82.2 fL (ref 78.0–100.0)
PLATELETS: 286 10*3/uL (ref 150–400)
RBC: 3.32 MIL/uL — ABNORMAL LOW (ref 3.87–5.11)
RDW: 16.7 % — AB (ref 11.5–15.5)
WBC: 10.2 10*3/uL (ref 4.0–10.5)

## 2016-09-23 LAB — GLUCOSE, CAPILLARY
GLUCOSE-CAPILLARY: 132 mg/dL — AB (ref 65–99)
GLUCOSE-CAPILLARY: 174 mg/dL — AB (ref 65–99)
GLUCOSE-CAPILLARY: 241 mg/dL — AB (ref 65–99)
GLUCOSE-CAPILLARY: 176 mg/dL — AB (ref 65–99)
Glucose-Capillary: 138 mg/dL — ABNORMAL HIGH (ref 65–99)
Glucose-Capillary: 198 mg/dL — ABNORMAL HIGH (ref 65–99)
Glucose-Capillary: 211 mg/dL — ABNORMAL HIGH (ref 65–99)

## 2016-09-23 LAB — HEPARIN LEVEL (UNFRACTIONATED)
HEPARIN UNFRACTIONATED: 0.17 [IU]/mL — AB (ref 0.30–0.70)
HEPARIN UNFRACTIONATED: 0.56 [IU]/mL (ref 0.30–0.70)
Heparin Unfractionated: 0.47 IU/mL (ref 0.30–0.70)

## 2016-09-23 LAB — BASIC METABOLIC PANEL
Anion gap: 7 (ref 5–15)
BUN: 33 mg/dL — AB (ref 6–20)
CALCIUM: 9.6 mg/dL (ref 8.9–10.3)
CO2: 33 mmol/L — ABNORMAL HIGH (ref 22–32)
Chloride: 100 mmol/L — ABNORMAL LOW (ref 101–111)
Creatinine, Ser: 1.25 mg/dL — ABNORMAL HIGH (ref 0.44–1.00)
GFR calc Af Amer: 47 mL/min — ABNORMAL LOW (ref >60)
GFR, EST NON AFRICAN AMERICAN: 41 mL/min — AB (ref >60)
GLUCOSE: 202 mg/dL — AB (ref 65–99)
Potassium: 3.2 mmol/L — ABNORMAL LOW (ref 3.5–5.1)
Sodium: 140 mmol/L (ref 135–145)

## 2016-09-23 LAB — PROTIME-INR
INR: 1.63
Prothrombin Time: 19.5 seconds — ABNORMAL HIGH (ref 11.4–15.2)

## 2016-09-23 MED ORDER — HEPARIN (PORCINE) IN NACL 100-0.45 UNIT/ML-% IJ SOLN
850.0000 [IU]/h | INTRAMUSCULAR | Status: DC
  Administered 2016-09-23 – 2016-09-26 (×4): 850 [IU]/h via INTRAVENOUS
  Filled 2016-09-23 (×4): qty 250

## 2016-09-23 MED ORDER — METOPROLOL TARTRATE 5 MG/5ML IV SOLN
5.0000 mg | Freq: Four times a day (QID) | INTRAVENOUS | Status: DC
  Administered 2016-09-23 – 2016-10-05 (×47): 5 mg via INTRAVENOUS
  Filled 2016-09-23 (×47): qty 5

## 2016-09-23 MED ORDER — LOSARTAN POTASSIUM 50 MG PO TABS
50.0000 mg | ORAL_TABLET | Freq: Every day | ORAL | Status: DC
  Administered 2016-09-23: 50 mg via NASOGASTRIC
  Filled 2016-09-23: qty 1

## 2016-09-23 MED ORDER — POTASSIUM CHLORIDE CRYS ER 20 MEQ PO TBCR: 40.0000 meq | EXTENDED_RELEASE_TABLET | Freq: Once | ORAL | Status: DC

## 2016-09-23 MED ORDER — INSULIN GLARGINE 100 UNIT/ML ~~LOC~~ SOLN
10.0000 [IU] | Freq: Every day | SUBCUTANEOUS | Status: DC
  Administered 2016-09-23: 10 [IU] via SUBCUTANEOUS
  Filled 2016-09-23 (×3): qty 0.1

## 2016-09-23 MED ORDER — POTASSIUM CHLORIDE 20 MEQ/15ML (10%) PO SOLN
40.0000 meq | Freq: Once | ORAL | Status: AC
  Administered 2016-09-23: 40 meq via ORAL
  Filled 2016-09-23: qty 30

## 2016-09-23 MED ORDER — WARFARIN SODIUM 5 MG PO TABS: 5.0000 mg | ORAL_TABLET | ORAL | Status: AC

## 2016-09-23 NOTE — Progress Notes (Signed)
Progress Note  Patient Name: Casey [name] Date of Encounter: 09/23/2016  Primary Cardiologist: Dr. Radford Pax  Subjective   Denies chest pain.  No SOB  Inpatient Medications    Scheduled Meds: . allopurinol  100 mg Per Tube Daily  . aspirin  81 mg Per Tube Daily  . atorvastatin  10 mg Per Tube QHS  . bacitracin  1 application Topical BID  . chlorhexidine gluconate (MEDLINE KIT)  15 mL Mouth Rinse BID  . Chlorhexidine Gluconate Cloth  6 each Topical Daily  . donepezil  10 mg Per Tube QHS  . feeding supplement (JEVITY 1.2 CAL)  1,000 mL Per Tube Q24H  . feeding supplement (PRO-STAT SUGAR FREE 64)  30 mL Per Tube BID  . free water  100 mL Per Tube Q4H  . furosemide  40 mg Intravenous BID  . insulin aspart  0-15 Units Subcutaneous Q4H  . lactobacillus  1 g Per Tube TID WC  . levETIRAcetam  250 mg Per Tube BID  . losartan  25 mg Per NG tube Daily  . mouth rinse  15 mL Mouth Rinse QID  . metoprolol tartrate  100 mg Per Tube BID  . multivitamin  15 mL Per Tube Daily  . pantoprazole sodium  40 mg Per Tube Q24H  . potassium chloride  40 mEq Oral Once  . sertraline  50 mg Per Tube QHS  . sodium chloride flush  10-40 mL Intracatheter Q12H  . warfarin  6 mg Oral ONCE-1800  . Warfarin - Pharmacist Dosing Inpatient   Does not apply q1800   Continuous Infusions: . heparin 850 Units/hr (09/23/16 0322)   PRN Meds: acetaminophen, bisacodyl, glycopyrrolate, ipratropium-albuterol, sodium chloride flush   Vital Signs    Vitals:   09/22/16 1916 09/22/16 2146 09/23/16 0438 09/23/16 0455  BP: (!) 157/70 (!) 157/70 (!) 155/62   Pulse: 95  94   Resp: (!) 28  (!) 24   Temp: 98.6 F (37 C)  98.8 F (37.1 C)   TempSrc: Oral  Oral   SpO2: 100%  100%   Weight:    132 lb 4.4 oz (60 kg)  Height:        Intake/Output Summary (Last 24 hours) at 09/23/16 0843 Last data filed at 09/23/16 0611  Gross per 24 hour  Intake          1497.45 ml  Output             1650 ml  Net           -152.55 ml   Filed Weights   09/21/16 0320 09/22/16 0400 09/23/16 0455  Weight: 138 lb 14.2 oz (63 kg) 135 lb 9.3 oz (61.5 kg) 132 lb 4.4 oz (60 kg)    Telemetry    Atrial fib.  Wide complex NST probably aberrant conduction - Personally Reviewed  ECG    N - Personally Reviewed  Physical Exam   GEN: No  acute distress.   Neck: No  JVD Cardiac: IrregularRR, no murmurs, rubs, or gallops.  Respiratory: Clear   to auscultation bilaterally. GI: Soft, nontender, non-distended, normal bowel sounds  MS:  No edema; No deformity. Neuro:   Nonfocal  Psych: Oriented and appropriate    Labs    Chemistry  Recent Labs Lab 09/21/16 0447 09/22/16 0500 09/23/16 0435  NA 139 142 140  K 3.8 3.5 3.2*  CL 104 104 100*  CO2 28 31 33*  GLUCOSE 237* 132* 202*  BUN 33*  37* 33*  CREATININE 1.21* 1.32* 1.25*  CALCIUM 9.9 9.9 9.6  GFRNONAA 42* 38* 41*  GFRAA 49* 44* 47*  ANIONGAP _0 Hematology  Recent Labs Lab 09/21/16 0447 09/22/16 0500 09/23/16 0435  WBC 12.9* 9.9 10.2  RBC 3.33* 3.32* 3.32*  HGB 8.9* 8.7* 8.8*  HCT 27.9* 27.4* 27.3*  MCV 83.8 82.5 82.2  MCH 26.7 26.2 26.5  MCHC 31.9 31.8 32.2  RDW 16.9* 16.9* 16.7*  PLT 277 284 286    Cardiac EnzymesNo results for input(s): TROPONINI in the last 168 hours. No results for input(s): TROPIPOC in the last 168 hours.   BNPNo results for input(s): BNP, PROBNP in the last 168 hours.   DDimer No results for input(s): DDIMER in the last 168 hours.   Radiology    Dg Abd 1 View  Result Date: 09/21/2016 CLINICAL DATA:  Nasogastric tube placement. EXAM: ABDOMEN - 1 VIEW COMPARISON:  Yesterday. FINDINGS: Feeding tube tip with its tip in scratch sec peak 2 tip in the mid stomach. No a nasogastric tube is seen. Normal bowel gas pattern. Left iliac stent. Lumbar spine degenerative changes. Small to moderate-sized right pleural effusion, increased. Interval small left pleural effusion. IMPRESSION: 1. Feeding tube tip in the mid  stomach. 2. No nasogastric tube seen. 3. Small moderate-sized right pleural effusion and small left pleural effusion. Electronically Signed   By: Claudie Revering M.D.   On: 09/21/2016 20:49   Dg Chest Port 1 View  Result Date: 09/22/2016 CLINICAL DATA:  Acute respiratory failure with hypoxia Hx CHF, diabetes, HTN, former smoker. EXAM: PORTABLE CHEST - 1 VIEW COMPARISON:  the previous day's study FINDINGS: Feeding tube and left arm PICC stable in position. Perihilar and bibasilar infiltrates or edema left greater than right, slightly improved since previous exam. Stable cardiomegaly.  Atheromatous aorta. Small right pleural effusion as before. Visualized bones unremarkable. IMPRESSION: 1. Asymmetric infiltrates or edema, slightly improved since previous exam. 2. Stable cardiomegaly and small right effusion. 3.  Support hardware stable in position. Electronically Signed   By: Lucrezia Europe M.D.   On: 09/22/2016 11:19   Dg Abd Portable 1v  Result Date: 09/22/2016 CLINICAL DATA:  Check feeding catheter placement EXAM: PORTABLE ABDOMEN - 1 VIEW COMPARISON:  None. FINDINGS: Feeding catheter is noted in the distal stomach. Contrast material is noted throughout the colon. No other focal abnormality is noted. IMPRESSION: Feeding catheter within the stomach. Electronically Signed   By: Inez Catalina M.D.   On: 09/22/2016 17:43   Dg Swallowing Func-speech Pathology  Result Date: 09/22/2016 Objective Swallowing Evaluation: Type of Study: MBS-Modified Barium Swallow Study Patient Details Name: Casey [name] MRN: [id number] Date of Birth: 10/25/1940 Today's Date: 09/22/2016 Time: SLP Start Time (ACUTE ONLY): 1400-SLP Stop Time (ACUTE ONLY): 1420 SLP Time Calculation (min) (ACUTE ONLY): 20 min Past Medical History: Past Medical History: Diagnosis Date . Abdominal or pelvic swelling, mass, or lump, left upper quadrant  . CHF (congestive heart failure) ([place])  . Chronic atrial fibrillation ([place])  . Chronic kidney disease  .  Congestive heart failure, unspecified  . Coronary atherosclerosis of unspecified type of vessel, native or graft  . Diabetes mellitus  . Edema  . GERD (gastroesophageal reflux disease)  . Gout, unspecified  . Hypercholesterolemia  . Hypertension  . 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 Marland Kitchen  Peripheral vascular disease, unspecified ([place])  . Seizures ([place])  . Sinus of Valsalva aneurysm   a. By 2D echo 05/2011. . Stroke ([place])  . Unspecified hereditary and idiopathic peripheral neuropathy  . Unspecified urinary incontinence  . Vascular dementia, uncomplicated  Past Surgical History: Past Surgical History: Procedure Laterality Date . AORTOGRAM  09/01/2008  Abd w/ bilateral lower extremity runoff arteriography . APPLICATION OF WOUND VAC Left 02/03/1094  Procedure: APPLICATION OF WOUND VAC LEFT GROIN;  Surgeon: Serafina Mitchell, MD;  Location: [place];  Service: Vascular;  Laterality: Left; . ENDARTERECTOMY FEMORAL Left 08/30/2016  Procedure: LEFT COMMON FEMORAL ARTERY ENDARTERECTOMY;  Surgeon: Serafina Mitchell, MD;  Location: [place];  Service: Vascular;  Laterality: Left; . FALSE ANEURYSM REPAIR  12/06/2011  Procedure: REPAIR FALSE ANEURYSM;  Surgeon: Angelia Mould, MD;  Location: [facility] OR;  Service: Vascular;  Laterality: Left;  Repair of left femoral Artery pseudoaneurysm . FALSE ANEURYSM REPAIR Left 08/30/2016  Procedure: REPAIR PSEUDOANEURYSM LEFT GROIN;  Surgeon: Serafina Mitchell, MD;  Location: [place];  Service: Vascular;  Laterality: Left; . FEMORAL ARTERY EXPLORATION  12/06/2011  Procedure: FEMORAL ARTERY EXPLORATION;  Surgeon: Angelia Mould, MD;  Location: [facility] OR;  Service: Vascular;  Laterality: N/A;  Exploration of large pseudoaneurysm left side of fem-fem bypass graft  . femoral to femoral bypass graft    . FEMORAL-FEMORAL BYPASS GRAFT  12/06/2011  Procedure: BYPASS  GRAFT FEMORAL-FEMORAL ARTERY;  Surgeon: Angelia Mould, MD;  Location[facility] OR;  Service: Vascular;  Laterality: Bilateral;  Revision of left to right Femoral-Femoral bypass graft . FEMORAL-FEMORAL BYPASS GRAFT Left 08/30/2016  Procedure: REDO RIGHT TO LEFT FEMORAL-FEMORAL ARTERY BYPASS GRAFT;  Surgeon: Serafina Mitchell, MD;  Location: [place];  Service: Vascular;  Laterality: Left; . FEMORAL-POPLITEAL BYPASS GRAFT Left 05/2002  lower extremity femoral to below knee w/ non-versed greater saphenous vein . FEMORAL-POPLITEAL BYPASS GRAFT Left 11/2002 . FEMORAL-POPLITEAL BYPASS GRAFT Left 08/30/2016  Procedure: REVISION OF LEFT FEMORAL-POPLITEAL ARTERY BYPASS GRAFT;  Surgeon: Serafina Mitchell, MD;  Location: [place];  Service: Vascular;  Laterality: Left; . THROMBECTOMY FEMORAL ARTERY Left 08/30/2016  Procedure: THROMBECTOMY OF FEMORAL-FEMORAL ARTERY BYPASS GRAFT AND LEFT FEMORAL TO LEFT POPLITEAL BYPASS GRAFT;  Surgeon: Serafina Mitchell, MD;  Location: [place];  Service: Vascular;  Laterality: Left; . TUBAL LIGATION Bilateral  HPI: Casey A Kittrellis a 75 y.o.femalewith a past medical history significant for lymphoma, dementia, pAF not on anticoag, CVA with R hemiparesis, CHF EF 35%, HTN, PVD s/p fem-fem bypass, seizures and legally blindwho presents with AMS, Pt found to have pna and required intubation from 8/17-8/22.  Swallow evaluation ordered.  Subjective: upright in Ha[place] chair Assessment / Plan / Recommendation CHL IP CLINICAL IMPRESSIONS 09/22/2016 Clinical Impression Patient presents with mild oral, moderate pharyngeal and suspected primary esophageal dysphagia. Deviations in swallow physiology include impaired pharyngeal sensation which is likely multifactorial given pt's intubation, hx of GERD. This resulted in delayed swallow initiation with liquids, which at times did not occur until liquids were already in pt's cervical esophagus (UES open at rest, NG present). This resulted in 1 episode of trace, sensed  aspiration of thin liquids which ocurred during the swallow. Oral stage was largely functional with the exception of pt's difficulty transiting barium tablet; she was unable to swallow whole or when broken into smaller pieces, though this is likely secondary to esophageal dysphagia, as pt was repeatedly belching and gagging slightly during attempts. Pharyngeal stage characterized by adequate tongue base retraction  and hyolaryngeal excursion, reduced pharyngeal stripping wave suspect secondary to suboptimal esophageal pressures. Cervical esophageal phase notable for intermittent backflow into the cervical esophagus and pharynx with thin and nectar thick liquids, regular solids. Frequent wet belching with thin liquids as well as delayed coughing which did not appear to be associated with penetration/aspiration events, however penetration/aspiration of esophageal backflow between frames not excluded. An esophageal sweep with nectar thick liquids revealed a standing column of barium in the upper esophagus, with retrograde bolus flow through the UES into the pharynx. Recommend pt remain NPO with NG at this time; she may benefit from GI referral, esophageal workup. SLP will f/u for readiness for PO/repeat instrumental pending follow-up for her suspected primary esophageal dysphagia.  SLP Visit Diagnosis Dysphagia, oropharyngeal phase (R13.12);Dysphagia, pharyngoesophageal phase (R13.14) Attention and concentration deficit following -- Frontal lobe and executive function deficit following -- Impact on safety and function Severe aspiration risk   CHL IP TREATMENT RECOMMENDATION 09/22/2016 Treatment Recommendations F/U MBS in --- days (Comment);Therapy as outlined in treatment plan below   Prognosis 09/22/2016 Prognosis for Safe Diet Advancement Guarded Barriers to Reach Goals Severity of deficits;Cognitive deficits Barriers/Prognosis Comment -- CHL IP DIET RECOMMENDATION 09/22/2016 SLP Diet Recommendations NPO;Alternative  means - temporary Liquid Administration via -- Medication Administration Via alternative means Compensations -- Postural Changes --   CHL IP OTHER RECOMMENDATIONS 09/22/2016 Recommended Consults Consider GI evaluation;Consider esophageal assessment Oral Care Recommendations Oral care QID Other Recommendations --   CHL IP FOLLOW UP RECOMMENDATIONS 09/22/2016 Follow up Recommendations Other (comment)   CHL IP FREQUENCY AND DURATION 09/22/2016 Speech Therapy Frequency (ACUTE ONLY) min 2x/week Treatment Duration 2 weeks      CHL IP ORAL PHASE 09/22/2016 Oral Phase Impaired Oral - Pudding Teaspoon -- Oral - Pudding Cup -- Oral - Honey Teaspoon -- Oral - Honey Cup -- Oral - Nectar Teaspoon -- Oral - Nectar Cup -- Oral - Nectar Straw -- Oral - Thin Teaspoon -- Oral - Thin Cup -- Oral - Thin Straw -- Oral - Puree -- Oral - Mech Soft -- Oral - Regular -- Oral - Multi-Consistency -- Oral - Pill Lingual pumping;Reduced posterior propulsion;Holding of bolus;Lingual/palatal residue Oral Phase - Comment --  CHL IP PHARYNGEAL PHASE 09/22/2016 Pharyngeal Phase Impaired Pharyngeal- Pudding Teaspoon -- Pharyngeal -- Pharyngeal- Pudding Cup -- Pharyngeal -- Pharyngeal- Honey Teaspoon -- Pharyngeal -- Pharyngeal- Honey Cup -- Pharyngeal -- Pharyngeal- Nectar Teaspoon -- Pharyngeal -- Pharyngeal- Nectar Cup Delayed swallow initiation-pyriform sinuses Pharyngeal -- Pharyngeal- Nectar Straw -- Pharyngeal -- Pharyngeal- Thin Teaspoon -- Pharyngeal -- Pharyngeal- Thin Cup Delayed swallow initiation-pyriform sinuses;Reduced pharyngeal peristalsis Pharyngeal -- Pharyngeal- Thin Straw Reduced pharyngeal peristalsis Pharyngeal -- Pharyngeal- Puree Delayed swallow initiation-vallecula;Reduced pharyngeal peristalsis Pharyngeal -- Pharyngeal- Mechanical Soft -- Pharyngeal -- Pharyngeal- Regular Delayed swallow initiation-vallecula;Reduced pharyngeal peristalsis Pharyngeal -- Pharyngeal- Multi-consistency -- Pharyngeal -- Pharyngeal- Pill -- Pharyngeal  -- Pharyngeal Comment --  CHL IP CERVICAL ESOPHAGEAL PHASE 09/22/2016 Cervical Esophageal Phase Impaired Pudding Teaspoon -- Pudding Cup -- Honey Teaspoon -- Honey Cup -- Nectar Teaspoon -- Nectar Cup Esophageal backflow into cervical esophagus;Esophageal backflow into the pharynx Nectar Straw -- Thin Teaspoon -- Thin Cup Esophageal backflow into cervical esophagus Thin Straw Esophageal backflow into cervical esophagus Puree Esophageal backflow into cervical esophagus Mechanical Soft -- Regular Esophageal backflow into the pharynx;Esophageal backflow into cervical esophagus Multi-consistency -- Pill -- Cervical Esophageal Comment -- Deneise Lever, MS, CCC-SLP Speech-Language Pathologist [phone] No flowsheet data found. Aliene Altes 09/22/2016, 3:44 PM  Cardiac Studies   Echo 8/11 - Left ventricle: The cavity size was normal. Wall thickness was   increased in a pattern of mild LVH. Systolic function was   severely reduced. The estimated ejection fraction was in the   range of 25% to 30%. Diffuse hypokinesis. - Aortic valve: There was mild regurgitation. - Mitral valve: There was mild regurgitation. - Left atrium: The atrium was severely dilated. - Right atrium: The atrium was severely dilated. - Tricuspid valve: There was moderate regurgitation. - Pulmonary arteries: Systolic pressure was moderately increased.   PA peak pressure: 53 mm Hg (S  Patient Profile     76 y.o. female with a hx of CAF-(not anticoagulated), prior CVA,  dementia, and cardiomyopathy who is being seen for the evaluation of NSVT, AF with RVR, and CHF at the request of Dr Halford Chessman.  Assessment & Plan    ATRIAL FIB:  Rate is controlled.  If she is moving to a facility where she is no longer a fall risk she should be on chronic anticoagulation.   Casey [name] has a CHA2DS2 - VASc score of 8.   For now continue the Heparin and make this decision at the time of discharge.   AC ON CHRONIC SYSTOLIC HF:   Increase the Losartan.    Signed, Minus Breeding, MD  09/23/2016, 8:43 AM

## 2016-09-23 NOTE — Progress Notes (Signed)
Panda tube removed by patient. States she "doesn't want that in my nose anymore". MD notified and orders to reinsert obtained. Eulas Post, RN

## 2016-09-23 NOTE — Progress Notes (Signed)
PROGRESS NOTE    Casey [name]  VVY:[id number] DOB: [birth year] DOA: 09/14/2016 PCP: Gildardo Cranker, DO   Brief Narrative: Casey [name] is a 76 y.o. female with a history of CVA, seizures, dementia, chronic Foley, recent repair of left femoral pseudoaneurysm. She presented with fever and body aches and found to have HCAP. She then developed respiratory failure and required intubation. Atrial fibrillation was complicated by RVR and required Cardizem drip. She also had acute on chronic systolic heart failure requiring aggressive diuresis. Patient extubated in transition to BiPAP which was eventually discontinued. She is an NG tube secondary to dysphagia with high aspiration risk. Patient is bedbound. Will need goals of care discussion with family and patient.   Assessment & Plan:   Active Problems:   PNA (pneumonia)   Pneumonia due to gram-positive bacteria   Pneumonia   Acute on chronic systolic CHF (congestive heart failure), NYHA class 4 (HCC)   DCM (dilated cardiomyopathy) (HCC)   Acute respiratory failure with hypoxia (HCC)   Atrial fibrillation with RVR Patient with a CHA2DS2-VASc Score is 8. Required diltiazem drip and was transitioned to oral metoprolol (per tube). Cardiology on board. INR of 1.63 -Cardiology recommendations -Continue coumadin per pharmacy with heparin bridge -Continue metoprolol  Acute on chronic systolic CHF EF of 18-48% with diffuse hypokinesis, PA peak pressure of 53 mmHg, severe right atrial enlargement and mild to moderate tricuspid regurg. Diuresed. Weight is down 18 pounds. Since admission, she is net +2 L per documented in/out's -Cardiology recommendations -Continue furosemide  Acute respiratory failure with hypoxia Secondary to HCAP and acute pulmonary edema from CHF exacerbation. Patient required intubation from 8/17 to 8/22 and then was transitioned to BiPAP. Currently resolved.  HCAP Tracheal aspirate without any growth. Patient  completed 8 day course of antibiotics.  CKD stage 3 Creatinine stable.  Severe protein calorie malnutrition Dysphagia Aspiration risk Currently on NG tube feeds -speech recommendations -continue tube feeding -Palliative care medicine consult for goals of care  Anemia of chronic disease Stable  Diabetes mellitus, type 2 -Start Lantus 10 units daily -Continue sliding-scale insulin  Bed bound Patient from SNF. -goals of care as mentioned above.  Dementia -Continue Aricept and Zoloft  History of CVA -Continue aspirin and Lipitor  Seizure disorder -Continue Keppra   DVT prophylaxis: Heparin drip; Coumadin Code Status: Full code Family Communication: Son over the phone Disposition Plan: Discharge pending goals of care discussions and Coumadin bridging   Consultants:   PCCM  Cardiology  Palliative care  Procedures:   ETT (8/17>>8/22)  Antimicrobials:  Zosyn (8/16)  Vancomycin (8/16>>8/19)  Cefepime (8/18>>8/23)    Subjective: This reports not realizing she was in the hospital. Was unclear as to how long she had been here. No complaints today. No dyspnea or chest pain.  Objective: Vitals:   09/22/16 1916 09/22/16 2146 09/23/16 0438 09/23/16 0455  BP: (!) 157/70 (!) 157/70 (!) 155/62   Pulse: 95  94   Resp: (!) 28  (!) 24   Temp: 98.6 F (37 C)  98.8 F (37.1 C)   TempSrc: Oral  Oral   SpO2: 100%  100%   Weight:    60 kg (132 lb 4.4 oz)  Height:        Intake/Output Summary (Last 24 hours) at 09/23/16 1209 Last data filed at 09/23/16 5927  Gross per 24 hour  Intake          1497.45 ml  Output  1650 ml  Net          -152.55 ml   Filed Weights   09/21/16 0320 09/22/16 0400 09/23/16 0455  Weight: 63 kg (138 lb 14.2 oz) 61.5 kg (135 lb 9.3 oz) 60 kg (132 lb 4.4 oz)    Examination:  General exam: Appears calm and comfortable. Chronically ill appearing. Respiratory system: Clear to auscultation. Respiratory effort  normal. Cardiovascular system: S1 & S2 heard, RRR. No murmurs. Gastrointestinal system: Abdomen is nondistended, soft and nontender. Normal bowel sounds heard. Central nervous system: Alert and oriented to person only. Extremities: No calf tenderness Skin: No cyanosis. No rashes Psychiatry: Judgement and insight appear impaired. Mood & affect depressed and flat. Memory impaired.    Data Reviewed: I have personally reviewed following labs and imaging studies  CBC:  Recent Labs Lab 09/19/16 0918 09/20/16 0252 09/21/16 0447 09/22/16 0500 09/23/16 0435  WBC 10.3 11.9* 12.9* 9.9 10.2  HGB 8.1* 9.0* 8.9* 8.7* 8.8*  HCT 24.6* 28.3* 27.9* 27.4* 27.3*  MCV 83.1 85.2 83.8 82.5 82.2  PLT 251 225 277 284 616   Basic Metabolic Panel:  Recent Labs Lab 09/17/16 0337  09/19/16 0235 09/20/16 0252 09/21/16 0136 09/21/16 0447 09/21/16 0914 09/21/16 1528 09/21/16 1803 09/22/16 0500 09/23/16 0435  NA 137  < > 145 143  --  139  --   --   --  142 140  K 3.8  < > 4.1 4.4  --  3.8  --   --   --  3.5 3.2*  CL 107  < > 117* 112*  --  104  --   --   --  104 100*  CO2 21*  < > 23 24  --  28  --   --   --  31 33*  GLUCOSE 186*  < > 149* 148*  --  237*  --   --   --  132* 202*  BUN 27*  < > 26* 29*  --  33*  --   --   --  37* 33*  CREATININE 1.21*  < > 1.34* 1.32*  --  1.21*  --   --   --  1.32* 1.25*  CALCIUM 9.1  < > 9.4 9.7  --  9.9  --   --   --  9.9 9.6  MG 1.1*  < > 1.6* 2.0 4.4*  --  3.2* 2.8* 2.8* 2.4  --   PHOS 3.4  --  1.9*  --   --   --   --   --   --   --   --   < > = values in this interval not displayed. GFR: Estimated Creatinine Clearance: 31.7 mL/min (A) (by C-G formula based on SCr of 1.25 mg/dL (H)). Liver Function Tests: No results for input(s): AST, ALT, ALKPHOS, BILITOT, PROT, ALBUMIN in the last 168 hours. No results for input(s): LIPASE, AMYLASE in the last 168 hours. No results for input(s): AMMONIA in the last 168 hours. Coagulation Profile:  Recent Labs Lab  09/19/16 0235 09/20/16 0252 09/21/16 0333 09/22/16 0500 09/23/16 0435  INR 1.36 1.25 1.39 1.41 1.63   Cardiac Enzymes: No results for input(s): CKTOTAL, CKMB, CKMBINDEX, TROPONINI in the last 168 hours. BNP (last 3 results) No results for input(s): PROBNP in the last 8760 hours. HbA1C: No results for input(s): HGBA1C in the last 72 hours. CBG:  Recent Labs Lab 09/22/16 1540 09/22/16 2012 09/23/16 0020 09/23/16 0434 09/23/16 0757  GLUCAP 148* 132* 211* 198* 174*   Lipid Profile: No results for input(s): CHOL, HDL, LDLCALC, TRIG, CHOLHDL, LDLDIRECT in the last 72 hours. Thyroid Function Tests: No results for input(s): TSH, T4TOTAL, FREET4, T3FREE, THYROIDAB in the last 72 hours. Anemia Panel: No results for input(s): VITAMINB12, FOLATE, FERRITIN, TIBC, IRON, RETICCTPCT in the last 72 hours. Sepsis Labs: No results for input(s): PROCALCITON, LATICACIDVEN in the last 168 hours.  Recent Results (from the past 240 hour(s))  Blood Culture (routine x 2)     Status: None   Collection Time: 09/14/16  4:14 AM  Result Value Ref Range Status   Specimen Description BLOOD LEFT HAND  Final   Special Requests IN PEDIATRIC BOTTLE Blood Culture adequate volume  Final   Culture   Final    NO GROWTH 5 DAYS Performed at [facility], [address], [place], [place] [address]    Report Status 09/19/2016 FINAL  Final  Blood Culture (routine x 2)     Status: None   Collection Time: 09/14/16  5:35 AM  Result Value Ref Range Status   Specimen Description BLOOD BLOOD RIGHT HAND  Final   Special Requests   Final    BOTTLES DRAWN AEROBIC AND ANAEROBIC Blood Culture adequate volume   Culture   Final    NO GROWTH 5 DAYS Performed at [facility], [place] [address], [place], [place] [address]    Report Status 09/19/2016 FINAL  Final  MRSA PCR Screening     Status: None   Collection Time: 09/14/16 10:10 AM  Result Value Ref Range Status   MRSA by PCR NEGATIVE NEGATIVE Final     Comment:        The GeneXpert MRSA Assay (FDA approved for NASAL specimens only), is one component of a comprehensive MRSA colonization surveillance program. It is not intended to diagnose MRSA infection nor to guide or monitor treatment for MRSA infections.   Culture, respiratory (NON-Expectorated)     Status: None   Collection Time: 09/14/16 11:50 AM  Result Value Ref Range Status   Specimen Description TRACHEAL ASPIRATE  Final   Special Requests NONE  Final   Gram Stain   Final    RARE WBC PRESENT,BOTH PMN AND MONONUCLEAR NO ORGANISMS SEEN    Culture   Final    NO GROWTH 2 DAYS Performed at [facility], [address] [place], [place] [address]    Report Status 09/16/2016 FINAL  Final  Urine culture     Status: None   Collection Time: 09/16/16  6:39 AM  Result Value Ref Range Status   Specimen Description URINE, RANDOM  Final   Special Requests NONE  Final   Culture   Final    NO GROWTH Performed at [facility], [place] [address], [place], [place] [address]    Report Status 09/17/2016 FINAL  Final         Radiology Studies: Dg Abd 1 View  Result Date: 09/21/2016 CLINICAL DATA:  Nasogastric tube placement. EXAM: ABDOMEN - 1 VIEW COMPARISON:  Yesterday. FINDINGS: Feeding tube tip with its tip in scratch sec peak 2 tip in the mid stomach. No a nasogastric tube is seen. Normal bowel gas pattern. Left iliac stent. Lumbar spine degenerative changes. Small to moderate-sized right pleural effusion, increased. Interval small left pleural effusion. IMPRESSION: 1. Feeding tube tip in the mid stomach. 2. No nasogastric tube seen. 3. Small moderate-sized right pleural effusion and small left pleural  effusion. Electronically Signed   By: Claudie Revering M.D.   On: 09/21/2016 20:49   Dg Chest Port 1 View  Result Date: 09/22/2016 CLINICAL DATA:  Acute respiratory failure with hypoxia Hx CHF, diabetes, HTN, former smoker. EXAM: PORTABLE CHEST - 1 VIEW COMPARISON:  the  previous day's study FINDINGS: Feeding tube and left arm PICC stable in position. Perihilar and bibasilar infiltrates or edema left greater than right, slightly improved since previous exam. Stable cardiomegaly.  Atheromatous aorta. Small right pleural effusion as before. Visualized bones unremarkable. IMPRESSION: 1. Asymmetric infiltrates or edema, slightly improved since previous exam. 2. Stable cardiomegaly and small right effusion. 3.  Support hardware stable in position. Electronically Signed   By: Lucrezia Europe M.D.   On: 09/22/2016 11:19   Dg Abd Portable 1v  Result Date: 09/22/2016 CLINICAL DATA:  Check feeding catheter placement EXAM: PORTABLE ABDOMEN - 1 VIEW COMPARISON:  None. FINDINGS: Feeding catheter is noted in the distal stomach. Contrast material is noted throughout the colon. No other focal abnormality is noted. IMPRESSION: Feeding catheter within the stomach. Electronically Signed   By: Inez Catalina M.D.   On: 09/22/2016 17:43   Dg Swallowing Func-speech Pathology  Result Date: 09/22/2016 Objective Swallowing Evaluation: Type of Study: MBS-Modified Barium Swallow Study Patient Details Name: Casey [name] MRN: [id number] Date of Birth: July 28, 1940 Today's Date: 09/22/2016 Time: SLP Start Time (ACUTE ONLY): 1400-SLP Stop Time (ACUTE ONLY): 1420 SLP Time Calculation (min) (ACUTE ONLY): 20 min Past Medical History: Past Medical History: Diagnosis Date . Abdominal or pelvic swelling, mass, or lump, left upper quadrant  . CHF (congestive heart failure) ([place])  . Chronic atrial fibrillation ([place])  . Chronic kidney disease  . Congestive heart failure, unspecified  . Coronary atherosclerosis of unspecified type of vessel, native or graft  . Diabetes mellitus  . Edema  . GERD (gastroesophageal reflux disease)  . Gout, unspecified  . Hypercholesterolemia  . Hypertension  . 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 . Peripheral vascular disease, unspecified ([place])  . Seizures ([place])  . Sinus of Valsalva aneurysm   a. By 2D echo 05/2011. . Stroke ([place])  . Unspecified hereditary and idiopathic peripheral neuropathy  . Unspecified urinary incontinence  . Vascular dementia, uncomplicated  Past Surgical History: Past Surgical History: Procedure Laterality Date . AORTOGRAM  09/01/2008  Abd w/ bilateral lower extremity runoff arteriography . APPLICATION OF WOUND VAC Left 09/02/1658  Procedure: APPLICATION OF WOUND VAC LEFT GROIN;  Surgeon: Serafina Mitchell, MD;  Location: [facility];  Service: Vascular;  Laterality: Left; . ENDARTERECTOMY FEMORAL Left 08/30/2016  Procedure: LEFT COMMON FEMORAL ARTERY ENDARTERECTOMY;  Surgeon: Serafina Mitchell, MD;  Location: [facility];  Service: Vascular;  Laterality: Left; . FALSE ANEURYSM REPAIR  12/06/2011  Procedure: REPAIR FALSE ANEURYSM;  Surgeon: Angelia Mould, MD;  Location: [facility] OR;  Service: Vascular;  Laterality: Left;  Repair of left femoral Artery pseudoaneurysm . FALSE ANEURYSM REPAIR Left 08/30/2016  Procedure: REPAIR PSEUDOANEURYSM LEFT GROIN;  Surgeon: Serafina Mitchell, MD;  Location: [facility];  Service: Vascular;  Laterality: Left; . FEMORAL ARTERY EXPLORATION  12/06/2011  Procedure: FEMORAL ARTERY EXPLORATION;  Surgeon: Angelia Mould, MD;  Location: [facility] OR;  Service: Vascular;  Laterality: N/A;  Exploration of large pseudoaneurysm left side of fem-fem bypass graft  . femoral to femoral bypass graft    .  FEMORAL-FEMORAL BYPASS GRAFT  12/06/2011  Procedure: BYPASS GRAFT FEMORAL-FEMORAL ARTERY;  Surgeon: Angelia Mould, MD;  Location[facility] OR;  Service: Vascular;  Laterality: Bilateral;  Revision of left to right Femoral-Femoral bypass graft . FEMORAL-FEMORAL BYPASS GRAFT Left 08/30/2016  Procedure: REDO RIGHT TO LEFT FEMORAL-FEMORAL ARTERY BYPASS GRAFT;  Surgeon: Serafina Mitchell, MD;  Location: [place] [facility];  Service: Vascular;  Laterality:  Left; . FEMORAL-POPLITEAL BYPASS GRAFT Left 05/2002  lower extremity femoral to below knee w/ non-versed greater saphenous vein . FEMORAL-POPLITEAL BYPASS GRAFT Left 11/2002 . FEMORAL-POPLITEAL BYPASS GRAFT Left 08/30/2016  Procedure: REVISION OF LEFT FEMORAL-POPLITEAL ARTERY BYPASS GRAFT;  Surgeon: Serafina Mitchell, MD;  Location: [facility];  Service: Vascular;  Laterality: Left; . THROMBECTOMY FEMORAL ARTERY Left 08/30/2016  Procedure: THROMBECTOMY OF FEMORAL-FEMORAL ARTERY BYPASS GRAFT AND LEFT FEMORAL TO LEFT POPLITEAL BYPASS GRAFT;  Surgeon: Serafina Mitchell, MD;  Location: [facility];  Service: Vascular;  Laterality: Left; . TUBAL LIGATION Bilateral  HPI: Casey A Kittrellis a 76 y.o.femalewith a past medical history significant for lymphoma, dementia, pAF not on anticoag, CVA with R hemiparesis, CHF EF 35%, HTN, PVD s/p fem-fem bypass, seizures and legally blindwho presents with AMS, Pt found to have pna and required intubation from 8/17-8/22.  Swallow evaluation ordered.  Subjective: upright in Ha[place] chair Assessment / Plan / Recommendation CHL IP CLINICAL IMPRESSIONS 09/22/2016 Clinical Impression Patient presents with mild oral, moderate pharyngeal and suspected primary esophageal dysphagia. Deviations in swallow physiology include impaired pharyngeal sensation which is likely multifactorial given pt's intubation, hx of GERD. This resulted in delayed swallow initiation with liquids, which at times did not occur until liquids were already in pt's cervical esophagus (UES open at rest, NG present). This resulted in 1 episode of trace, sensed aspiration of thin liquids which ocurred during the swallow. Oral stage was largely functional with the exception of pt's difficulty transiting barium tablet; she was unable to swallow whole or when broken into smaller pieces, though this is likely secondary to esophageal dysphagia, as pt was repeatedly belching and gagging slightly during attempts. Pharyngeal stage characterized  by adequate tongue base retraction and hyolaryngeal excursion, reduced pharyngeal stripping wave suspect secondary to suboptimal esophageal pressures. Cervical esophageal phase notable for intermittent backflow into the cervical esophagus and pharynx with thin and nectar thick liquids, regular solids. Frequent wet belching with thin liquids as well as delayed coughing which did not appear to be associated with penetration/aspiration events, however penetration/aspiration of esophageal backflow between frames not excluded. An esophageal sweep with nectar thick liquids revealed a standing column of barium in the upper esophagus, with retrograde bolus flow through the UES into the pharynx. Recommend pt remain NPO with NG at this time; she may benefit from GI referral, esophageal workup. SLP will f/u for readiness for PO/repeat instrumental pending follow-up for her suspected primary esophageal dysphagia.  SLP Visit Diagnosis Dysphagia, oropharyngeal phase (R13.12);Dysphagia, pharyngoesophageal phase (R13.14) Attention and concentration deficit following -- Frontal lobe and executive function deficit following -- Impact on safety and function Severe aspiration risk   CHL IP TREATMENT RECOMMENDATION 09/22/2016 Treatment Recommendations F/U MBS in --- days (Comment);Therapy as outlined in treatment plan below   Prognosis 09/22/2016 Prognosis for Safe Diet Advancement Guarded Barriers to Reach Goals Severity of deficits;Cognitive deficits Barriers/Prognosis Comment -- CHL IP DIET RECOMMENDATION 09/22/2016 SLP Diet Recommendations NPO;Alternative means - temporary Liquid Administration via -- Medication Administration Via alternative means Compensations -- Postural Changes --   CHL IP OTHER RECOMMENDATIONS 09/22/2016 Recommended Consults Consider  GI evaluation;Consider esophageal assessment Oral Care Recommendations Oral care QID Other Recommendations --   CHL IP FOLLOW UP RECOMMENDATIONS 09/22/2016 Follow up Recommendations  Other (comment)   CHL IP FREQUENCY AND DURATION 09/22/2016 Speech Therapy Frequency (ACUTE ONLY) min 2x/week Treatment Duration 2 weeks      CHL IP ORAL PHASE 09/22/2016 Oral Phase Impaired Oral - Pudding Teaspoon -- Oral - Pudding Cup -- Oral - Honey Teaspoon -- Oral - Honey Cup -- Oral - Nectar Teaspoon -- Oral - Nectar Cup -- Oral - Nectar Straw -- Oral - Thin Teaspoon -- Oral - Thin Cup -- Oral - Thin Straw -- Oral - Puree -- Oral - Mech Soft -- Oral - Regular -- Oral - Multi-Consistency -- Oral - Pill Lingual pumping;Reduced posterior propulsion;Holding of bolus;Lingual/palatal residue Oral Phase - Comment --  CHL IP PHARYNGEAL PHASE 09/22/2016 Pharyngeal Phase Impaired Pharyngeal- Pudding Teaspoon -- Pharyngeal -- Pharyngeal- Pudding Cup -- Pharyngeal -- Pharyngeal- Honey Teaspoon -- Pharyngeal -- Pharyngeal- Honey Cup -- Pharyngeal -- Pharyngeal- Nectar Teaspoon -- Pharyngeal -- Pharyngeal- Nectar Cup Delayed swallow initiation-pyriform sinuses Pharyngeal -- Pharyngeal- Nectar Straw -- Pharyngeal -- Pharyngeal- Thin Teaspoon -- Pharyngeal -- Pharyngeal- Thin Cup Delayed swallow initiation-pyriform sinuses;Reduced pharyngeal peristalsis Pharyngeal -- Pharyngeal- Thin Straw Reduced pharyngeal peristalsis Pharyngeal -- Pharyngeal- Puree Delayed swallow initiation-vallecula;Reduced pharyngeal peristalsis Pharyngeal -- Pharyngeal- Mechanical Soft -- Pharyngeal -- Pharyngeal- Regular Delayed swallow initiation-vallecula;Reduced pharyngeal peristalsis Pharyngeal -- Pharyngeal- Multi-consistency -- Pharyngeal -- Pharyngeal- Pill -- Pharyngeal -- Pharyngeal Comment --  CHL IP CERVICAL ESOPHAGEAL PHASE 09/22/2016 Cervical Esophageal Phase Impaired Pudding Teaspoon -- Pudding Cup -- Honey Teaspoon -- Honey Cup -- Nectar Teaspoon -- Nectar Cup Esophageal backflow into cervical esophagus;Esophageal backflow into the pharynx Nectar Straw -- Thin Teaspoon -- Thin Cup Esophageal backflow into cervical esophagus Thin Straw  Esophageal backflow into cervical esophagus Puree Esophageal backflow into cervical esophagus Mechanical Soft -- Regular Esophageal backflow into the pharynx;Esophageal backflow into cervical esophagus Multi-consistency -- Pill -- Cervical Esophageal Comment -- Deneise Lever, MS, CCC-SLP Speech-Language Pathologist [phone] No flowsheet data found. Aliene Altes 09/22/2016, 3:44 PM                   Scheduled Meds: . allopurinol  100 mg Per Tube Daily  . aspirin  81 mg Per Tube Daily  . atorvastatin  10 mg Per Tube QHS  . bacitracin  1 application Topical BID  . chlorhexidine gluconate (MEDLINE KIT)  15 mL Mouth Rinse BID  . Chlorhexidine Gluconate Cloth  6 each Topical Daily  . donepezil  10 mg Per Tube QHS  . feeding supplement (JEVITY 1.2 CAL)  1,000 mL Per Tube Q24H  . feeding supplement (PRO-STAT SUGAR FREE 64)  30 mL Per Tube BID  . free water  100 mL Per Tube Q4H  . furosemide  40 mg Intravenous BID  . insulin aspart  0-15 Units Subcutaneous Q4H  . lactobacillus  1 g Per Tube TID WC  . levETIRAcetam  250 mg Per Tube BID  . losartan  50 mg Per NG tube Daily  . mouth rinse  15 mL Mouth Rinse QID  . metoprolol tartrate  100 mg Per Tube BID  . multivitamin  15 mL Per Tube Daily  . pantoprazole sodium  40 mg Per Tube Q24H  . potassium chloride  40 mEq Oral Once  . sertraline  50 mg Per Tube QHS  . sodium chloride flush  10-40 mL Intracatheter Q12H  . warfarin  5  mg Per Tube STAT  . warfarin  6 mg Oral ONCE-1800  . Warfarin - Pharmacist Dosing Inpatient   Does not apply q1800   Continuous Infusions: . heparin 850 Units/hr (09/23/16 0322)     LOS: 9 days     Cordelia Poche, MD [facility] 09/23/2016, 12:09 PM Pager: [phone]  If 7PM-7AM, please contact night-coverage [email or web address] Password [facility] 09/23/2016, 12:09 PM

## 2016-09-23 NOTE — Progress Notes (Signed)
ANTICOAGULATION CONSULT NOTE - Follow Up Consult  Pharmacy Consult for Heparin, Warfarin Indication: atrial fibrillation  Allergies  Allergen Reactions  . Penicillins Other (See Comments)    Told by a doctor to "not take" and on MAR as an allergy Has patient had a PCN reaction causing immediate rash, facial/tongue/throat swelling, SOB or lightheadedness with hypotension: Unknown Has patient had a PCN reaction causing severe rash involving mucus membranes or skin necrosis: Unknown Has patient had a PCN reaction that required hospitalization: Unknown Has patient had a PCN reaction occurring within the last 10 years: Unknown If all of the above answers are "NO", then may proceed with Ceph    Patient Measurements: Height: 5\' 3"  (160 cm) Weight: 135 lb 9.3 oz (61.5 kg) IBW/kg (Calculated) : 52.4 Heparin Dosing Weight: 64 kg  Vital Signs: Temp: 98.6 F (37 C) (08/25 1916) Temp Source: Oral (08/25 1916) BP: 157/70 (08/25 2146) Pulse Rate: 95 (08/25 1916)  Labs:  Recent Labs  09/20/16 0252  09/21/16 0333 09/21/16 0447  09/22/16 0500 09/22/16 0847 09/22/16 2042  HGB 9.0*  --   --  8.9*  --  8.7*  --   --   HCT 28.3*  --   --  27.9*  --  27.4*  --   --   PLT 225  --   --  277  --  284  --   --   LABPROT 15.8*  --  17.2*  --   --  17.4*  --   --   INR 1.25  --  1.39  --   --  1.41  --   --   HEPARINUNFRC 0.56  < >  --   --   < > 0.16* 0.19* 0.17*  CREATININE 1.32*  --   --  1.21*  --  1.32*  --   --   < > = values in this interval not displayed.  Estimated Creatinine Clearance: 30 mL/min (A) (by C-G formula based on SCr of 1.32 mg/dL (H)).   Medications:  Infusions:  . heparin      Assessment: 76 yo female with hx of Afib (NO anticoagulation PTA), dementia, CVA, seizures, bedbound, recent repair of left femoral pseduo aneurysm complictaed with infection at the surgical site treated with doxycycline now admitted with subjective fever cough shortness of breath and  generalized body aches. On tele, found to be in Afib with RVR and started on cardizem drip which was stopped due to hypotension.  Pharmacy is consulted to dose IV heparin and warfarin for anticoagulation for afib.   8/25  HL= 0.16 units/ml, decreased to subtherapeutic on heparin at 600 units/hr (Heparin infusing through PICC line.  0500 AM labs may have been drawn from PICC line by RN, but then would expect HL to be high.  Current RN and lab cannot confirm where labs were drawn from.)    HL recheck = 0.19, remains subtherapeutic  Hgb low/stable at 8.7, Plt remain WNL   No bleeding or complications reported.  RN reports that she has not seen any pink tinged oral secretions yet today.  Scr 1.32, CrCl 30 ml/min  INR 1.41 subtherapeutic and very little increase after warfarin x3 doses.  Warfarin dose on 8/24 was charted as NOT given because pt had removed NG tube.  NG tube replaced on 8/24, but warfarin dose was still not given. Today, 8/26  8/25 1830 HL > not drawn b/c RN waiting for IV, but peripheral stick so lab to draw.  2300 in process but not back called lab they were having problems switching reagent.  0100 called RN and she called lab again. HL finally resulted ~0130= 0.17 units/ml.  No infusion or bleeding issues reported by RN. Below goal.   Goal of Therapy:  INR 2-3 Heparin level 0.3-0.7 units/ml Monitor platelets by anticoagulation protocol: Yes   Plan:   Increase to heparin drip at 850 units/hr  Check heparin level in 8 hours after rate change.  Daily heparin level, INR, and CBC  Continue to monitor H&H and platelets  Patient will need a minimum of 5 days warfarin / Heparin overlap and until INR > 2 for 24 hours.   Pharmacy will provide warfarin education to family/caretakers if possible.  Pt is not appropriate for education at this time d/t dementia.    Dorrene German 09/23/2016 1:34 AM

## 2016-09-23 NOTE — Progress Notes (Signed)
Attempted to reinsert Panda tube. Pt stated "I would much rather die than to have that tube put back in". Education provided patient is not agreeable. Will update provider

## 2016-09-23 NOTE — Progress Notes (Signed)
ANTICOAGULATION CONSULT NOTE - Follow Up Consult  Pharmacy Consult for Heparin, Warfarin Indication: atrial fibrillation  Allergies  Allergen Reactions  . Penicillins Other (See Comments)    Told by a doctor to "not take" and on MAR as an allergy Has patient had a PCN reaction causing immediate rash, facial/tongue/throat swelling, SOB or lightheadedness with hypotension: Unknown Has patient had a PCN reaction causing severe rash involving mucus membranes or skin necrosis: Unknown Has patient had a PCN reaction that required hospitalization: Unknown Has patient had a PCN reaction occurring within the last 10 years: Unknown If all of the above answers are "NO", then may proceed with Ceph    Patient Measurements: Height: 5\' 3"  (160 cm) Weight: 132 lb 4.4 oz (60 kg) IBW/kg (Calculated) : 52.4 Heparin Dosing Weight: 64 kg  Vital Signs: Temp: 98.8 F (37.1 C) (08/26 0438) Temp Source: Oral (08/26 0438) BP: 155/62 (08/26 0438) Pulse Rate: 94 (08/26 0438)  Labs:  Recent Labs  09/21/16 0333  09/21/16 0447  09/22/16 0500 09/22/16 0847 09/22/16 2042 09/23/16 0435 09/23/16 1047  HGB  --   < > 8.9*  --  8.7*  --   --  8.8*  --   HCT  --   --  27.9*  --  27.4*  --   --  27.3*  --   PLT  --   --  277  --  284  --   --  286  --   LABPROT 17.2*  --   --   --  17.4*  --   --  19.5*  --   INR 1.39  --   --   --  1.41  --   --  1.63  --   HEPARINUNFRC  --   --   --   < > 0.16* 0.19* 0.17*  --  0.47  CREATININE  --   --  1.21*  --  1.32*  --   --  1.25*  --   < > = values in this interval not displayed.  Estimated Creatinine Clearance: 31.7 mL/min (A) (by C-G formula based on SCr of 1.25 mg/dL (H)).   Medications:  Infusions:  . heparin 850 Units/hr (09/23/16 0322)    Assessment: 76 yo female with hx of Afib (NO anticoagulation PTA), dementia, CVA, seizures, bedbound, recent repair of left femoral pseduo aneurysm complictaed with infection at the surgical site treated with  doxycycline now admitted with subjective fever cough shortness of breath and generalized body aches. On tele, found to be in Afib with RVR and started on cardizem drip which was stopped due to hypotension.  Pharmacy is consulted to dose IV heparin and warfarin for anticoagulation for afib.   09/22/16  HL= 0.16 units/ml, decreased to subtherapeutic on heparin at 600 units/hr (Heparin infusing through PICC line.  0500 AM labs may have been drawn from PICC line by RN, but then would expect HL to be high.  Current RN and lab cannot confirm where labs were drawn from.)    HL recheck = 0.19, remains subtherapeutic  Hgb low/stable at 8.7, Plt remain WNL   No bleeding or complications reported.  RN reports that she has not seen any pink tinged oral secretions yet today.  Scr 1.32, CrCl 30 ml/min  INR 1.41 subtherapeutic and very little increase after warfarin x3 doses.  Warfarin dose on 8/24 was charted as NOT given because pt had removed NG tube.  NG tube replaced on 8/24, but warfarin dose  was still not given. Today, 8/26  8/25 1830 HL > not drawn b/c RN waiting for IV, but peripheral stick so lab to draw.  2300 in process but not back called lab they were having problems switching reagent.  0100 called RN and she called lab again. HL finally resulted ~0130= 0.17 units/ml.  No infusion or bleeding issues reported by RN. Below goal.   Today, 09/23/16  HL therapeutic on current IV heparin rate of 850 units/hr  No reported bleeding  CBC stable  INR subtherapeutic but rising despite no warfarin dose given last 2 days even thought 6mg  has been ordered both days  Goal of Therapy:  INR 2-3 Heparin level 0.3-0.7 units/ml Monitor platelets by anticoagulation protocol: Yes   Plan:   Continue IV heparin drip at 850 units/hr  Will recheck heparin level in 8 hours to confirm continued goal level at current rate.  Daily heparin level, INR, and CBC  Will order warfarin 5mg  now per tube  Patient  will need a minimum of 5 days warfarin / Heparin overlap and until INR > 2 for 24 hours.   Pharmacy will provide warfarin education to family/caretakers if possible.  Pt is not appropriate for education at this time d/t dementia.    Adrian Saran, PharmD, BCPS Pager [phone] 09/23/2016 11:50 AM

## 2016-09-23 NOTE — Progress Notes (Signed)
ANTICOAGULATION CONSULT NOTE - Follow Up Consult  Pharmacy Consult for Heparin, Warfarin Indication: atrial fibrillation  Allergies  Allergen Reactions  . Penicillins Other (See Comments)    Told by a doctor to "not take" and on MAR as an allergy Has patient had a PCN reaction causing immediate rash, facial/tongue/throat swelling, SOB or lightheadedness with hypotension: Unknown Has patient had a PCN reaction causing severe rash involving mucus membranes or skin necrosis: Unknown Has patient had a PCN reaction that required hospitalization: Unknown Has patient had a PCN reaction occurring within the last 10 years: Unknown If all of the above answers are "NO", then may proceed with Ceph    Patient Measurements: Height: 5\' 3"  (160 cm) Weight: 132 lb 4.4 oz (60 kg) IBW/kg (Calculated) : 52.4 Heparin Dosing Weight: 64 kg  Vital Signs: Temp: 98.2 F (36.8 C) (08/26 1435) Temp Source: Oral (08/26 1435) BP: 150/60 (08/26 1435) Pulse Rate: 93 (08/26 1435)  Labs:  Recent Labs  09/21/16 0333  09/21/16 0447  09/22/16 0500  09/22/16 2042 09/23/16 0435 09/23/16 1047 09/23/16 2211  HGB  --   < > 8.9*  --  8.7*  --   --  8.8*  --   --   HCT  --   --  27.9*  --  27.4*  --   --  27.3*  --   --   PLT  --   --  277  --  284  --   --  286  --   --   LABPROT 17.2*  --   --   --  17.4*  --   --  19.5*  --   --   INR 1.39  --   --   --  1.41  --   --  1.63  --   --   HEPARINUNFRC  --   --   --   < > 0.16*  < > 0.17*  --  0.47 0.56  CREATININE  --   --  1.21*  --  1.32*  --   --  1.25*  --   --   < > = values in this interval not displayed.  Estimated Creatinine Clearance: 31.7 mL/min (A) (by C-G formula based on SCr of 1.25 mg/dL (H)).   Medications:  Infusions:  . heparin 850 Units/hr (09/23/16 0322)    Assessment: 76 yo female with hx of Afib (NO anticoagulation PTA), dementia, CVA, seizures, bedbound, recent repair of left femoral pseduo aneurysm complictaed with infection at  the surgical site treated with doxycycline now admitted with subjective fever cough shortness of breath and generalized body aches. On tele, found to be in Afib with RVR and started on cardizem drip which was stopped due to hypotension.  Pharmacy is consulted to dose IV heparin and warfarin for anticoagulation for afib.   09/22/16  HL= 0.16 units/ml, decreased to subtherapeutic on heparin at 600 units/hr (Heparin infusing through PICC line.  0500 AM labs may have been drawn from PICC line by RN, but then would expect HL to be high.  Current RN and lab cannot confirm where labs were drawn from.)    HL recheck = 0.19, remains subtherapeutic  Hgb low/stable at 8.7, Plt remain WNL   No bleeding or complications reported.  RN reports that she has not seen any pink tinged oral secretions yet today.  Scr 1.32, CrCl 30 ml/min  INR 1.41 subtherapeutic and very little increase after warfarin x3 doses.  Warfarin dose  on 8/24 was charted as NOT given because pt had removed NG tube.  NG tube replaced on 8/24, but warfarin dose was still not given. Today, 8/26  8/25 1830 HL > not drawn b/c RN waiting for IV, but peripheral stick so lab to draw.  2300 in process but not back called lab they were having problems switching reagent.  0100 called RN and she called lab again. HL finally resulted ~0130= 0.17 units/ml.  No infusion or bleeding issues reported by RN. Below goal.   HL therapeutic on current IV heparin rate of 850 units/hr  No reported bleeding  CBC stable  INR subtherapeutic but rising despite no warfarin dose given last 2 days even thought 6mg  has been ordered both days  2211 HL=0.56, no infusion or bleeding issues reported by RN.   Goal of Therapy:  INR 2-3 Heparin level 0.3-0.7 units/ml Monitor platelets by anticoagulation protocol: Yes   Plan:   Continue IV heparin drip at 850 units/hr  Daily heparin level, INR, and CBC  Patient will need a minimum of 5 days warfarin / Heparin  overlap and until INR > 2 for 24 hours.   Pharmacy will provide warfarin education to family/caretakers if possible.  Pt is not appropriate for education at this time d/t dementia.   Dorrene German 09/23/2016 11:06 PM

## 2016-09-24 ENCOUNTER — Encounter (HOSPITAL_COMMUNITY): Payer: Self-pay

## 2016-09-24 ENCOUNTER — Encounter: Payer: Medicare Other | Admitting: Surgery

## 2016-09-24 ENCOUNTER — Inpatient Hospital Stay (HOSPITAL_COMMUNITY): Payer: Medicare Other

## 2016-09-24 DIAGNOSIS — R0602 Shortness of breath: Secondary | ICD-10-CM

## 2016-09-24 DIAGNOSIS — Z515 Encounter for palliative care: Secondary | ICD-10-CM

## 2016-09-24 DIAGNOSIS — J189 Pneumonia, unspecified organism: Secondary | ICD-10-CM | POA: Diagnosis present

## 2016-09-24 DIAGNOSIS — Z7189 Other specified counseling: Secondary | ICD-10-CM

## 2016-09-24 LAB — GLUCOSE, CAPILLARY
GLUCOSE-CAPILLARY: 136 mg/dL — AB (ref 65–99)
GLUCOSE-CAPILLARY: 136 mg/dL — AB (ref 65–99)
Glucose-Capillary: 85 mg/dL (ref 65–99)
Glucose-Capillary: 117 mg/dL — ABNORMAL HIGH (ref 65–99)
Glucose-Capillary: 121 mg/dL — ABNORMAL HIGH (ref 65–99)
Glucose-Capillary: 106 mg/dL — ABNORMAL HIGH (ref 65–99)

## 2016-09-24 LAB — CBC
HEMATOCRIT: 28.1 % — AB (ref 36.0–46.0)
HEMOGLOBIN: 9 g/dL — AB (ref 12.0–15.0)
MCH: 26.2 pg (ref 26.0–34.0)
MCHC: 32 g/dL (ref 30.0–36.0)
MCV: 81.7 fL (ref 78.0–100.0)
Platelets: 296 10*3/uL (ref 150–400)
RBC: 3.44 MIL/uL — AB (ref 3.87–5.11)
RDW: 16.6 % — ABNORMAL HIGH (ref 11.5–15.5)
WBC: 11 10*3/uL — ABNORMAL HIGH (ref 4.0–10.5)

## 2016-09-24 LAB — PROTIME-INR
INR: 1.57
PROTHROMBIN TIME: 19 s — AB (ref 11.4–15.2)

## 2016-09-24 LAB — HEPARIN LEVEL (UNFRACTIONATED): HEPARIN UNFRACTIONATED: 0.52 [IU]/mL (ref 0.30–0.70)

## 2016-09-24 MED ORDER — WARFARIN SODIUM 6 MG PO TABS: 6.0000 mg | ORAL_TABLET | Freq: Once | ORAL | Status: AC

## 2016-09-24 NOTE — Progress Notes (Deleted)
Patient was a no-show 

## 2016-09-24 NOTE — Evaluation (Signed)
SLP Cancellation Note  Patient Details Name: MCKENA CHERN MRN: [id number] DOB: [birth year]   Cancelled treatment:       Reason Eval/Treat Not Completed:  (new order received, spoke to MD re: MBS findings and concern for primary esophageal dysphagia, will follow up)   Macario Golds 09/24/2016, 10:10 AM Luanna Salk, [place] [facility] SLP [phone]

## 2016-09-24 NOTE — Progress Notes (Signed)
Nutrition Follow-up  DOCUMENTATION CODES:   Severe malnutrition in context of acute illness/injury  INTERVENTION:  - If PEG placed, continue Jevity 1.2 @ 40 mL/hr with 40 mL Prostat BID and 100 mL free water every 4 hours. - Will monitor for [place] following family meeting tomorrow.   NUTRITION DIAGNOSIS:   Malnutrition (severe) related to acute illness, wound healing (abdominal wound) as evidenced by moderate depletion of body fat, severe depletion of muscle mass. -ongoing  GOAL:   Patient will meet greater than or equal to 90% of their needs -met with current TF regimen.   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 She was hypoxic requiring 2 L. She was started on Cardizem drip and became hypotensive and hence this was stopped. Developed respiratory distress and was emergently intubated in the ED  8/27 She remains NPO per recommendations following MBS. Weight trending down since previous assessment and current weight is -8 lbs/3.3 kg from weight on 8/23. Patient pulled Panda tube earlier this AM and Dr. Julianne Rice note from this AM states that pt and family (son and daughter) are agreeable to PEG placement. Palliative Care following and plan for family meeting tomorrow for [place].   Medications reviewed; 40 mg IV Lasix BID, sliding scale Novolog, 10 units of Lanus/day, 1 g lactobacillus TID, 15 mL liquid multivitamin per tube/day, 40 mg Protonix per tube/day, 40 mEq oral KCl x1 dose yesterday. Labs reviewed; CBGs: 136 and 85 mg/dL today. Labs from 8/26: K: 3.2 mmol/L, Cl: 100 mmol/L, BUN: 33 mg/dL, creatinine: 1.25 mg/dL, GFR: 41 mL/min.    8/23 - Pt with Panda in place  and receiving Jevity 1.2 @ 40 mL/hr with 30 mL Prostat BID and 100 mL free water every 4 hours.  - This regimen is providing 1352 kcal, 83 grams of protein, and 1375 mL free water.  - SLP performed bedside swallow evaluation earlier this AM and recommends NPO with plan to follow-up tomorrow.  - Per rounds this AM, plans to transfer to telemetry if bed becomes available.  - Weight stable from yesterday and only +0.4 kg from admission weight. PCCM NP note from this AM states plan to continue free water per [facility] until PO intake becomes possible.    8/22 - Patient extubated this morning. - Panda placed today. TF to be resumed.  - Will advance tube feeds slightly to meet new estimated needs.    Diet Order:  Diet NPO time specified  Skin:   (abdominal and groin incisions)  Last BM:  8/27  Height:   Ht Readings from Last 1 Encounters:  09/14/16 5' 3" (1.6 m)    Weight:   Wt Readings from Last 1 Encounters:  09/24/16 134 lb 14.7 oz (61.2 kg)    Ideal Body Weight:  52.3 kg  BMI:  Body mass index is 23.9 kg/m.  Estimated Nutritional Needs:   Kcal:  1250-1450  Protein:  75-85g  Fluid:  1.5L/day  EDUCATION NEEDS:   No education needs identified at this time    Jarome Matin, MS, RD, LDN, CNSC Inpatient Clinical Dietitian Pager # [phone] After hours/weekend pager # [phone]

## 2016-09-24 NOTE — Progress Notes (Addendum)
Patient ID: Casey [name], female   DOB: 08-Nov-1940, 76 y.o.   MRN: [id number]                                                                PROGRESS NOTE                                                                                                                                                                                                             Patient Demographics:    Casey [name], is a 76 y.o. female, DOB - [birth year], LKG:[id number]  Admit date - 09/14/2016   Admitting Physician Collene Gobble, MD  Outpatient Primary MD for the patient is Gildardo Cranker, DO  LOS - 10  Outpatient Specialists:     Chief Complaint  Patient presents with  . Headache  . general pain       Brief Narrative  76 y.o. female with a history of CVA, seizures, dementia, chronic Foley, recent repair of left femoral pseudoaneurysm. She presented with fever and body aches and found to have HCAP. She then developed respiratory failure and required intubation. Atrial fibrillation was complicated by RVR and required Cardizem drip. She also had acute on chronic systolic heart failure requiring aggressive diuresis. Patient extubated in transition to BiPAP which was eventually discontinued. She is an NG tube secondary to dysphagia with high aspiration risk. Patient is bedbound. Will need goals of care discussion with family and patient.    Subjective:    [place] today has no bleeding, Hgb stable 9.0 this AM, INR subtherapeutic.  1.57, pt appears to be an aspiration risk, pulled out her NGT ? Last nite,  Pt doesn't want [place] to replace, but she is amenable to PEG. Palliative care is coming by to see about goals of care.  Slight hypoxia ? Last nite, ? Aspiration, CXR pending  No headache, No chest pain, No abdominal pain - No Nausea, No new weakness tingling or numbness, No Cough - SOB.    Assessment  & Plan :    Active Problems:   PNA (pneumonia)   Pneumonia due to gram-positive bacteria  Pneumonia   Acute on chronic systolic CHF (congestive heart failure), NYHA class 4 (HCC)   DCM (dilated cardiomyopathy) (HCC)   Acute respiratory failure with hypoxia (HCC)  Atrial fibrillation with RVR Patient with a CHA2DS2-VASc Scoreis 8. Required diltiazem drip and was transitioned to oral metoprolol (per tube). Cardiology on board. INR =1.57 (8/27) -Cardiology recommendations -Continue coumadin per pharmacy with heparin bridge -Continue metoprolol -awaiting therapeutic INR  Acute on chronic systolic CHF EF of 53-74% with diffuse hypokinesis, PA peak pressure of 53 mmHg, severe right atrial enlargement and mild to moderate tricuspid regurg. Diuresed. Weight is down 18 pounds. Since admission, she is net +2 L per documented in/out's -Cardiology recommendations -Continue furosemide  Acute respiratory failure with hypoxia Secondary to HCAP and acute pulmonary edema from CHF exacerbation. Patient required intubation from 8/17 to 8/22 and then was transitioned to BiPAP. Pt destaturated last nite, appears stable at present time CXR pending r/o aspiration  HCAP Tracheal aspirate without any growth. Patient completed 8 day course of antibiotics.  CKD stage 3 Creatinine stable.  Severe protein calorie malnutrition Dysphagia Aspiration risk -pt pulled out NGT 8/27 -speech consulted => pt high aspiration risk  On evaluation 8/25 -pt appears to be amenable to PEG, daughter Casey [name] and son Casey [name] both ok with PEG (called on 8/27) -GI consult for dysphagia, possible PEG -Palliative care medicine consult for goals of care  Anemia of chronic disease Stable  Diabetes mellitus, type 2 -Start Lantus 10 units daily -Continue sliding-scale insulin  Bed bound Patient from SNF. -goals of care as mentioned above.  Dementia -Continue Aricept and Zoloft  History of CVA -Continue aspirin and Lipitor  Seizure disorder -Continue Keppra   DVT prophylaxis: Heparin drip;  Coumadin Code Status: Full code Family Communication: Son over the phone Disposition Plan: Discharge pending goals of care discussions and Coumadin bridging   Consultants:   PCCM  Cardiology  Palliative care  Procedures:   ETT (8/17>>8/22)  Antimicrobials:  Zosyn (8/16)  Vancomycin (8/16>>8/19)  Cefepime (8/18>>8/23)    Lab Results  Component Value Date   PLT 296 09/24/2016      Anti-infectives    Start     Dose/Rate Route Frequency Ordered Stop   09/15/16 0600  vancomycin (VANCOCIN) IVPB 1000 mg/200 mL premix  Status:  Discontinued     1,000 mg 200 mL/hr over 60 Minutes Intravenous Every 24 hours 09/14/16 1100 09/17/16 0908   09/15/16 0000  vancomycin (VANCOCIN) IVPB 1000 mg/200 mL premix  Status:  Discontinued     1,000 mg 200 mL/hr over 60 Minutes Intravenous  Once 09/14/16 0732 09/14/16 1100   09/14/16 1200  ceFEPIme (MAXIPIME) 2 g in dextrose 5 % 50 mL IVPB     2 g 100 mL/hr over 30 Minutes Intravenous Every 24 hours 09/14/16 1100 09/20/16 2359   09/14/16 0745  piperacillin-tazobactam (ZOSYN) IVPB 3.375 g  Status:  Discontinued     3.375 g 100 mL/hr over 30 Minutes Intravenous Every 6 hours 09/14/16 0732 09/14/16 1057   09/14/16 0600  vancomycin (VANCOCIN) IVPB 1000 mg/200 mL premix     1,000 mg 200 mL/hr over 60 Minutes Intravenous  Once 09/14/16 0554 09/14/16 0819   09/14/16 0600  piperacillin-tazobactam (ZOSYN) IVPB 3.375 g     3.375 g 100 mL/hr over 30 Minutes Intravenous  Once 09/14/16 0554 09/14/16 0819        Objective:   Vitals:   09/23/16 2335 09/24/16 0208 09/24/16 0450 09/24/16 0500  BP: 132/78 125/76 (!) 153/71   Pulse: (!) 113 93 74   Resp: (!) 25 (!) 26 (!) 24   Temp: 98.3 F (36.8 C) 98.8 F (37.1 C) 97.8 F (36.6  C)   TempSrc: Oral Oral Oral   SpO2: 100% 96% 98%   Weight:    61.2 kg (134 lb 14.7 oz)  Height:        Wt Readings from Last 3 Encounters:  09/24/16 61.2 kg (134 lb 14.7 oz)  09/13/16 56 kg (123 lb 8 oz)    09/10/16 56 kg (123 lb 8 oz)     Intake/Output Summary (Last 24 hours) at 09/24/16 0720 Last data filed at 09/24/16 0450  Gross per 24 hour  Intake           168.44 ml  Output             2600 ml  Net         -2431.56 ml     Physical Exam  Awake Alert, Oriented X 3, No new F.N deficits, Normal affect [place].AT,PERRAL Supple Neck,No JVD, No cervical lymphadenopathy appriciated.  Symmetrical Chest wall movement, Good air movement bilaterally, CTAB RRR,No Gallops,Rubs or new Murmurs, No Parasternal Heave +ve B.Sounds, Abd Soft, No tenderness, No organomegaly appriciated, No rebound - guarding or rigidity. No Cyanosis, Clubbing or edema, No new Rash or bruise     Data Review:    CBC  Recent Labs Lab 09/20/16 0252 09/21/16 0447 09/22/16 0500 09/23/16 0435 09/24/16 0417  WBC 11.9* 12.9* 9.9 10.2 11.0*  HGB 9.0* 8.9* 8.7* 8.8* 9.0*  HCT 28.3* 27.9* 27.4* 27.3* 28.1*  PLT 225 277 284 286 296  MCV 85.2 83.8 82.5 82.2 81.7  MCH 27.1 26.7 26.2 26.5 26.2  MCHC 31.8 31.9 31.8 32.2 32.0  RDW 17.3* 16.9* 16.9* 16.7* 16.6*    Chemistries   Recent Labs Lab 09/19/16 0235 09/20/16 0252 09/21/16 0136 09/21/16 0447 09/21/16 0914 09/21/16 1528 09/21/16 1803 09/22/16 0500 09/23/16 0435  NA 145 143  --  139  --   --   --  142 140  K 4.1 4.4  --  3.8  --   --   --  3.5 3.2*  CL 117* 112*  --  104  --   --   --  104 100*  CO2 23 24  --  28  --   --   --  31 33*  GLUCOSE 149* 148*  --  237*  --   --   --  132* 202*  BUN 26* 29*  --  33*  --   --   --  37* 33*  CREATININE 1.34* 1.32*  --  1.21*  --   --   --  1.32* 1.25*  CALCIUM 9.4 9.7  --  9.9  --   --   --  9.9 9.6  MG 1.6* 2.0 4.4*  --  3.2* 2.8* 2.8* 2.4  --    ------------------------------------------------------------------------------------------------------------------ No results for input(s): CHOL, HDL, LDLCALC, TRIG, CHOLHDL, LDLDIRECT in the last 72 hours.  Lab Results  Component Value Date   HGBA1C 6.6 (H)  08/30/2016   ------------------------------------------------------------------------------------------------------------------ No results for input(s): TSH, T4TOTAL, T3FREE, THYROIDAB in the last 72 hours.  Invalid input(s): FREET3 ------------------------------------------------------------------------------------------------------------------ No results for input(s): VITAMINB12, FOLATE, FERRITIN, TIBC, IRON, RETICCTPCT in the last 72 hours.  Coagulation profile  Recent Labs Lab 09/20/16 0252 09/21/16 0333 09/22/16 0500 09/23/16 0435 09/24/16 0417  INR 1.25 1.39 1.41 1.63 1.57    No results for input(s): DDIMER in the last 72 hours.  Cardiac Enzymes No results for input(s): CKMB, TROPONINI, MYOGLOBIN in the last 168 hours.  Invalid input(s): CK ------------------------------------------------------------------------------------------------------------------  Component Value Date/Time   BNP 932.9 (H) 09/14/2016 1050    Inpatient Medications  Scheduled Meds: . allopurinol  100 mg Per Tube Daily  . aspirin  81 mg Per Tube Daily  . atorvastatin  10 mg Per Tube QHS  . bacitracin  1 application Topical BID  . chlorhexidine gluconate (MEDLINE KIT)  15 mL Mouth Rinse BID  . Chlorhexidine Gluconate Cloth  6 each Topical Daily  . donepezil  10 mg Per Tube QHS  . feeding supplement (JEVITY 1.2 CAL)  1,000 mL Per Tube Q24H  . feeding supplement (PRO-STAT SUGAR FREE 64)  30 mL Per Tube BID  . free water  100 mL Per Tube Q4H  . furosemide  40 mg Intravenous BID  . insulin aspart  0-15 Units Subcutaneous Q4H  . insulin glargine  10 Units Subcutaneous Daily  . lactobacillus  1 g Per Tube TID WC  . levETIRAcetam  250 mg Per Tube BID  . losartan  50 mg Per NG tube Daily  . mouth rinse  15 mL Mouth Rinse QID  . metoprolol tartrate  5 mg Intravenous Q6H  . multivitamin  15 mL Per Tube Daily  . pantoprazole sodium  40 mg Per Tube Q24H  . sertraline  50 mg Per Tube QHS  .  sodium chloride flush  10-40 mL Intracatheter Q12H  . warfarin  5 mg Per Tube STAT  . Warfarin - Pharmacist Dosing Inpatient   Does not apply q1800   Continuous Infusions: . heparin 850 Units/hr (09/23/16 0322)   PRN Meds:.acetaminophen, bisacodyl, glycopyrrolate, ipratropium-albuterol, sodium chloride flush  Micro Results Recent Results (from the past 240 hour(s))  MRSA PCR Screening     Status: None   Collection Time: 09/14/16 10:10 AM  Result Value Ref Range Status   MRSA by PCR NEGATIVE NEGATIVE Final    Comment:        The GeneXpert MRSA Assay (FDA approved for NASAL specimens only), is one component of a comprehensive MRSA colonization surveillance program. It is not intended to diagnose MRSA infection nor to guide or monitor treatment for MRSA infections.   Culture, respiratory (NON-Expectorated)     Status: None   Collection Time: 09/14/16 11:50 AM  Result Value Ref Range Status   Specimen Description TRACHEAL ASPIRATE  Final   Special Requests NONE  Final   Gram Stain   Final    RARE WBC PRESENT,BOTH PMN AND MONONUCLEAR NO ORGANISMS SEEN    Culture   Final    NO GROWTH 2 DAYS Performed at [facility], [address] [place], [place] [address]    Report Status 09/16/2016 FINAL  Final  Urine culture     Status: None   Collection Time: 09/16/16  6:39 AM  Result Value Ref Range Status   Specimen Description URINE, RANDOM  Final   Special Requests NONE  Final   Culture   Final    NO GROWTH Performed at [facility], [place] [address], [place], [place] [address]    Report Status 09/17/2016 FINAL  Final    Radiology Reports Dg Chest 2 View  Result Date: 09/14/2016 CLINICAL DATA:  Generalized body aches, atrial fibrillation. Sepsis. History of lymphoma. EXAM: CHEST  2 VIEW COMPARISON:  Chest radiograph September 07, 2016 FINDINGS: New patchy airspace opacities RIGHT lung and LEFT lung base. No pleural effusion. Stable cardiomegaly. Fullness of the RIGHT  greater than LEFT hila is unchanged. Calcified aortic knob. Similar chronic interstitial changes.  No pneumothorax. Soft tissue planes and included osseous structures are nonsuspicious. IMPRESSION: New bilateral patchy airspace opacities concerning for pneumonia. Followup PA and lateral chest X-ray is recommended in 3-4 weeks following trial of antibiotic therapy to ensure resolution and exclude underlying malignancy. Stable cardiomegaly and chronic interstitial changes. Stable fullness of the hila concerning for lymphadenopathy given patient's history of lymphoma. Electronically Signed   By: Elon Alas M.D.   On: 09/14/2016 04:49   Dg Chest 2 View  Result Date: 09/07/2016 CLINICAL DATA:  Tachycardia today. EXAM: CHEST  2 VIEW COMPARISON:  CT chest 01/10/2015.  PA and lateral chest 12/06/2011. FINDINGS: There is cardiomegaly without edema. Fullness of the right hilum consistent with lymphadenopathy as seen on the prior CT scan is unchanged. The lungs are clear. There is no pneumothorax. Small bilateral pleural effusions are seen. Aortic atherosclerosis is noted. IMPRESSION: Small bilateral pleural effusions. Cardiomegaly without edema. Atherosclerosis. Fullness of the right hilum consistent with lymphadenopathy as seen on prior CT scan is unchanged. Electronically Signed   By: Inge Rise M.D.   On: 09/07/2016 12:17   Dg Abd 1 View  Result Date: 09/21/2016 CLINICAL DATA:  Nasogastric tube placement. EXAM: ABDOMEN - 1 VIEW COMPARISON:  Yesterday. FINDINGS: Feeding tube tip with its tip in scratch sec peak 2 tip in the mid stomach. No a nasogastric tube is seen. Normal bowel gas pattern. Left iliac stent. Lumbar spine degenerative changes. Small to moderate-sized right pleural effusion, increased. Interval small left pleural effusion. IMPRESSION: 1. Feeding tube tip in the mid stomach. 2. No nasogastric tube seen. 3. Small moderate-sized right pleural effusion and small left pleural effusion.  Electronically Signed   By: Claudie Revering M.D.   On: 09/21/2016 20:49   Dg Abd 1 View  Result Date: 09/20/2016 CLINICAL DATA:  Evaluate NG tube EXAM: ABDOMEN - 1 VIEW COMPARISON:  Abdomen radiograph 09/19/2016 FINDINGS: Enteric tube tip projects over the left upper quadrant, feeding tube projects over the stomach. Mildly gaseous distended stomach. Nonobstructed bowel gas pattern. Lumbar spine degenerative changes. Cardiomegaly. Basilar atelectasis. Stent projects over the left hemipelvis. Bilateral lower abdominal surgical clips. IMPRESSION: Feeding tube within the mid stomach. Electronically Signed   By: Lovey Newcomer M.D.   On: 09/20/2016 15:38   Dg Abd 1 View  Result Date: 09/19/2016 CLINICAL DATA:  Pain and a placement EXAM: ABDOMEN - 1 VIEW COMPARISON:  09/14/2016 FINDINGS: Soft feeding tube enters the abdomen with its tip in the midportion of the stomach. Bowel gas pattern appears normal. IMPRESSION: Feeding tube tip in the midportion of the stomach. Electronically Signed   By: Nelson Chimes M.D.   On: 09/19/2016 11:17   Dg Abd 1 View  Result Date: 09/14/2016 CLINICAL DATA:  OG tube placement EXAM: ABDOMEN - 1 VIEW COMPARISON:  09/14/2016 FINDINGS: OG tube tip is in the distal stomach. Nonobstructive bowel gas pattern. IMPRESSION: OG tube tip in the distal stomach. Electronically Signed   By: Rolm Baptise M.D.   On: 09/14/2016 11:04   Ct Abdomen Pelvis W Contrast  Result Date: 08/29/2016 CLINICAL DATA:  Initial evaluation for acute abdominal pain, left rolling mass. EXAM: CT ABDOMEN AND PELVIS WITH CONTRAST TECHNIQUE: Multidetector CT imaging of the abdomen and pelvis was performed using the standard protocol following bolus administration of intravenous contrast. CONTRAST:  171m ISOVUE-300 IOPAMIDOL (ISOVUE-300) INJECTION 61% COMPARISON:  Prior CT from 12/04/2011. FINDINGS: Lower chest: Scattered atelectatic changes noted within the visualized lung bases. Cardiomegaly partially visualized. No  pleural or pericardial  effusion. Hepatobiliary: Liver demonstrates a normal contrast enhanced appearance. Gallbladder within normal limits. No biliary dilatation. Pancreas: Pancreas somewhat atrophic but otherwise unremarkable without acute inflammation or mass lesion. Spleen: Subcentimeter hypodensity noted within central aspect of the spleen, of doubtful significance. Spleen otherwise unremarkable. Adrenals/Urinary Tract: Adrenal glands within normal limits. Kidneys relatively equal in size with symmetric enhancement. Scattered cortical thinning and scarring. 5.6 cm cyst noted extending from the lower pole left kidney. Additional scattered subcentimeter hypodensities within the bilateral kidneys too small the characterize, but statistically likely reflects small cysts as well. Multiple nonobstructive calculi present within the right kidney, largest of which positioned within the lower pole and measures approximately 15 mm. Mild right hydroureteronephrosis. There is an obstructive 6 mm calculus at the right UVJ (series 2, image 64). No left-sided renal calculi. No left-sided hydronephrosis or hydroureter. Bladder largely decompressed. Mild circumferential bladder wall thickening like related incomplete distension. Stomach/Bowel: Stomach within normal limits. No evidence for bowel obstruction. Appendix within normal limits. No acute inflammatory changes seen about the bowels. Large volume stool seen packed within the rectal vault, suggesting constipation. Vascular/Lymphatic: Extensive atherosclerosis throughout the intra-abdominal aorta and its branch vessels. Origin of the celiac axis, SMA, renal arteries, and IMA are grossly patent. Occlusion of the left common iliac just distal to its origin (series 2, image 42). Left internal iliac occluded proximally, with distal reconstitution likely via collateralization. Occluded vascular stent in place within the left external iliac artery. Extensive atherosclerotic disease  throughout the right iliac system. Fem-fem bypass graft in place. That right common femoral anastomosis appears patent. Large pseudoaneurysm measuring 4.4 x 5.7 x 7.5 cm arises from the left common femoral anastomosis in the left inguinal region (series 2, image 63). Opacification of the left common femoral artery distally. Reproductive: Scattered calcified uterine fibroids noted. Uterus and ovaries otherwise within normal limits. Other: No free air or fluid. Musculoskeletal: Small intramuscular lipoma noted within the left abdominus musculature. No acute osseous abnormality. No worrisome lytic or blastic osseous lesions. IMPRESSION: 1. 4.4 x 5.7 x 7.5 cm pseudoaneurysm arising from the left aspect of a fem-fem bypass graft in the left inguinal region. Finding suggestive of graft failure. Superimposed infection not excluded. 2. Extensive atherosclerotic calcifications throughout the intra-abdominal aorta and its branch vessels. 3. 6 mm calculus at the right UVJ with secondary mild right hydroureteronephrosis. Additional nonobstructive right renal nephrolithiasis as above. 4. Large volume stool within the rectal vault, suggesting constipation. Critical Value/emergent results were called by telephone at the time of interpretation on 08/29/2016 at 11:29 pm to Dr. Gareth Morgan , who verbally acknowledged these results. Electronically Signed   By: Jeannine Boga M.D.   On: 08/29/2016 23:34   Ct Angio Ao+bifem W & Or Wo Contrast  Result Date: 09/07/2016 CLINICAL DATA:  Status post repair of left femoral pseudoaneurysm with revision of right the left femoral- femoral bypass graft and left femoral-popliteal bypass graft. There has been some oozing of blood from the left groin operative site. EXAM: CT ANGIOGRAPHY OF ABDOMINAL AORTA WITH ILIOFEMORAL RUNOFF TECHNIQUE: Multidetector CT imaging of the abdomen, pelvis and lower extremities was performed using the standard protocol during bolus administration of  intravenous contrast. Multiplanar CT image reconstructions and MIPs were obtained to evaluate the vascular anatomy. CONTRAST:  100 mL Isovue 370 IV COMPARISON:  CT of the abdomen and pelvis on 08/29/2016 FINDINGS: VASCULAR Aorta: The abdominal aorta and shows stable patency and atherosclerotic plaque. No evidence of aneurysmal disease or significant aortic stenosis. Celiac: Calcified plaque  causes approximately 40- 50% origin stenosis. SMA: Calcified plaque causes approximately 50% origin stenosis. Renals: Bilateral single renal arteries without significant stenosis. IMA: Origin patent. The proximal trunk shows heavily calcified plaque without occlusion. RIGHT Lower Extremity Inflow: Calcified common iliac artery without significant stenosis. Internal and external iliac arteries show diffuse disease. Maximal narrowing of the proximal external iliac artery approaches 50%. Outflow: Calcified plaque causes focal narrowing of the common femoral artery just prior to the femoral-femoral bypass graft. The artery is narrowed approximately 60- 70%. The femoral-femoral graft is patent but is poorly opacified distally in the left groin. There is some thickening around the distal graft extending superiorly and abutting the abdominal wall. There likely is some postoperative hemorrhage in this region as well as a few foci of air. No findings to suggest focal abscess. 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 Profunda femoral artery is patent. At the level of the popliteal artery, there is poor arterial opacification and popliteal patency cannot be adequately assessed. Runoff: Patency of runoff below the right knee cannot be adequately assessed, due to poor opacification of distal vessels. 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 Internal and external iliac arteries are also chronically occluded. At the level of the left groin, the distal  aspect of the femoral- femoral bypass graft is poorly opacified and there is evidence now of a new synthetic femoral-popliteal bypass graft which is poorly opacified. Outflow: Unable to assess distal bypass graft and native popliteal artery patency. Runoff: Unable to assess runoff artery patency. Review of the MIP images confirms the above findings. NON-VASCULAR Lower chest: Bibasilar scarring and small bilateral pleural effusions, right greater than left. Hepatobiliary: No focal liver abnormality is seen. No gallstones, gallbladder wall thickening, or biliary dilatation. Pancreas: Unremarkable. No pancreatic ductal dilatation or surrounding inflammatory changes. Spleen: Normal in size without focal abnormality. Adrenals/Urinary Tract: Adrenal glands are unremarkable. Stable nonobstructing right renal calculi. Stable lower pole renal cyst on the left. Stable small right-sided bladder calculus. Stomach/Bowel: No evidence of bowel obstruction or ileus. There is a fairly large amount of fecal material located in the rectum. No free air. Lymphatic: No enlarged lymph nodes identified. Reproductive: Uterus demonstrates calcified degenerated fibroids. No adnexal masses. Other: No free fluid or hernias. Musculoskeletal: Degenerative disc disease at L5-S1. IMPRESSION: VASCULAR 1. Poorly opacified distal segment of the right to left femoral- femoral bypass graft and poorly opacified new left femoral to popliteal bypass graft. Although some of this is felt to relate to contrast timing, there would be some concern of the potential for imminent thrombosis and vascular surgical assessment is recommended. The previously identified left groin pseudoaneurysm has been resected and repaired and there is no evidence of recurrent pseudoaneurysm or active contrast extravasation. Some postoperative changes are evident around the graft likely representing some hemorrhage that extends superiorly abutting the abdominal wall. 2. The native  right common femoral artery does demonstrate significant disease and focal moderate stenosis just proximal to the bypass graft anastomosis which could account for some potential inflow restriction into the femoral-femoral graft. 3. Poor opacification at the level of bilateral popliteal and tibial arteries on the CTA results and lack of accurate patency assessment of these vessels. NON-VASCULAR 1. Stable nonobstructing right renal calculi and bladder calculus. 2. Fairly large amount of stool is present in the rectum. Electronically Signed   By: Aletta Edouard M.D.   On: 09/07/2016 16:06   Dg Chest [facility]  Result Date: 09/22/2016 CLINICAL DATA:  Acute respiratory failure with hypoxia Hx CHF, diabetes, HTN, former smoker. EXAM: PORTABLE CHEST - 1 VIEW COMPARISON:  the previous day's study FINDINGS: Feeding tube and left arm PICC stable in position. Perihilar and bibasilar infiltrates or edema left greater than right, slightly improved since previous exam. Stable cardiomegaly.  Atheromatous aorta. Small right pleural effusion as before. Visualized bones unremarkable. IMPRESSION: 1. Asymmetric infiltrates or edema, slightly improved since previous exam. 2. Stable cardiomegaly and small right effusion. 3.  Support hardware stable in position. Electronically Signed   By: Lucrezia Europe M.D.   On: 09/22/2016 11:19   Dg Chest Port 1 View  Result Date: 09/21/2016 CLINICAL DATA:  Pneumonia. EXAM: PORTABLE CHEST 1 VIEW COMPARISON:  09/20/2016. FINDINGS: Feeding tube, left PICC line in stable position. Cardiomegaly with mild bilateral interstitial prominence and pleural effusions consistent with congestive heart failure. Similar findings on prior exam. Basilar atelectasis. No pneumothorax. IMPRESSION: 1. Lines and tubes in stable position. 2. Cardiomegaly with diffuse mild bilateral interstitial prominence and bilateral pleural effusions consistent with congestive heart failure. Similar findings noted on prior exam. 3.  Basilar atelectasis . Electronically Signed   By: Marcello Moores  Register   On: 09/21/2016 06:23   Dg Chest Port 1 View  Result Date: 09/20/2016 CLINICAL DATA:  Dyspnea EXAM: PORTABLE CHEST 1 VIEW COMPARISON:  09/19/2016 FINDINGS: Multifocal perihilar and bilateral lower lobe opacities, suspicious for moderate interstitial edema, less likely multifocal pneumonia. Associated small bilateral pleural effusions, right greater than left. Overall appearance is likely mildly progressed. Cardiomegaly. Left arm PICC terminates at the cavoatrial junction. Prior enteric tube has been replaced with a feeding tube which courses into the stomach. IMPRESSION: Suspected moderate interstitial edema, mildly progressed. Small bilateral pleural effusions, right greater than left. Electronically Signed   By: Julian Hy M.D.   On: 09/20/2016 23:06   Dg Chest Port 1 View  Result Date: 09/19/2016 CLINICAL DATA:  Respiratory failure. EXAM: PORTABLE CHEST 1 VIEW COMPARISON:  09/18/2016. FINDINGS: Endotracheal tube, NG tube, left PICC line stable position . Cardiomegaly with slight increase in basilar infiltrates and small pleural effusions consistent with slight worsening of CHF. No pneumothorax. IMPRESSION: 1.  Lines and tubes in stable position. 2. Cardiomegaly with slight increase in basilar infiltrates and small pleural effusions consistent with slight worsening of CHF. Electronically Signed   By: Marcello Moores  Register   On: 09/19/2016 06:21   Dg Chest Port 1 View  Result Date: 09/18/2016 CLINICAL DATA:  PICC line placement. EXAM: PORTABLE CHEST 1 VIEW COMPARISON:  09/18/2016. FINDINGS: PICC line noted with its tip projected over the superior vena cava. Endotracheal tube and NG tube in stable position. Cardiomegaly with bilateral mild interstitial prominence and small pleural effusions consistent CHF. Mild basilar atelectasis.Slight improvement from prior exam. IMPRESSION: 1.  Left PICC line in stable position. 2.  Endotracheal  tube and NG tube in stable position. 3.  Mild from prior exam . Electronically Signed   By: Marcello Moores  Register   On: 09/18/2016 12:54   Dg Chest Port 1 View  Result Date: 09/18/2016 CLINICAL DATA:  Respiratory failure, history of lymphoma, CHF, peripheral vascular disease, current smoker. EXAM: PORTABLE CHEST 1 VIEW COMPARISON:  Portable chest x-ray of September 17, 2016 FINDINGS: The trachea remains intubated with the tip of the tube lying 2.5 cm above the carina. The esophagogastric tube tip projects below the inferior margin of the image. The lungs are well-expanded. The lung markings are coarse in the lower lobes. A  small right pleural effusion likely layers posteriorly. The cardiac silhouette remains enlarged. The pulmonary vascularity is minimally prominent. There is calcification in the wall of the thoracic aorta. IMPRESSION: COPD. Probable subsegmental atelectasis or infiltrate in the lower lobes without discrete air bronchograms. Reasonable positioning of the endotracheal tube. Mild cardiomegaly with mild pulmonary vascular congestion. Thoracic aortic atherosclerosis. Electronically Signed   By: David  Martinique M.D.   On: 09/18/2016 07:09   Dg Chest Port 1 View  Result Date: 09/17/2016 CLINICAL DATA:  Respiratory failure. EXAM: PORTABLE CHEST 1 VIEW COMPARISON:  09/16/2016. FINDINGS: Endotracheal tube tip 1.4 cm above the carina. Retraction of approximately 2 cm should be considered. NG tube noted in stable position. Cardiomegaly with diffuse mild bilateral interstitial prominence and small bilateral pleural effusions suggesting CHF again noted. Similar findings noted on prior exam . Improved basilar atelectasis. No pneumothorax. IMPRESSION: 1. Endotracheal tube tip 1.4 cm above the carina. Retraction of approximately 2 cm should be considered. NG tube noted stable position. 2. Cardiomegaly with diffuse mild bilateral interstitial prominence and small bilateral pleural effusions suggesting CHF again noted.  Similar findings noted on prior exam. 3. Improved bibasilar atelectasis. Electronically Signed   By: Marcello Moores  Register   On: 09/17/2016 06:48   Dg Chest Port 1 View  Result Date: 09/16/2016 CLINICAL DATA:  Acute respiratory failure EXAM: PORTABLE CHEST 1 VIEW COMPARISON:  Two days ago FINDINGS: Endotracheal tube tip between the clavicular heads and carina. An orogastric tube has been advanced to the stomach at least. Cardiomegaly. Partial visualization of the left diaphragm. Haziness of the lower chest is likely from atelectasis and pleural effusions. Atelectasis about the right minor fissure and patchy primarily perihilar opacity. Interstitial coarsening is improved. No pneumothorax. IMPRESSION: 1. Unremarkable positioning of tubes and central line. 2. Interstitial coarsening is improved, likely decreasing edema. 3. Patchy bilateral airspace opacity from residual airspace edema or pneumonia. Progressive atelectasis at the left base with volume loss. Electronically Signed   By: Monte Fantasia M.D.   On: 09/16/2016 07:02   Dg Chest Portable 1 View  Result Date: 09/14/2016 CLINICAL DATA:  Intubated.  OG tube placement. EXAM: PORTABLE CHEST 1 VIEW COMPARISON:  09/14/2016 FINDINGS: Endotracheal tube is 4 cm above the carina. OG tube tip is in the midesophagus. Cardiomegaly. Patchy bilateral airspace opacities, right greater than left are similar to prior study. No effusions. IMPRESSION: Endotracheal tube 4 cm above the carina. OG tube tip in the midthoracic esophagus. Recommend advancing. Stable patchy bilateral airspace opacities, right greater than left. Electronically Signed   By: Rolm Baptise M.D.   On: 09/14/2016 09:19   Dg Abd Portable 1v  Result Date: 09/22/2016 CLINICAL DATA:  Check feeding catheter placement EXAM: PORTABLE ABDOMEN - 1 VIEW COMPARISON:  None. FINDINGS: Feeding catheter is noted in the distal stomach. Contrast material is noted throughout the colon. No other focal abnormality is  noted. IMPRESSION: Feeding catheter within the stomach. Electronically Signed   By: Inez Catalina M.D.   On: 09/22/2016 17:43   Dg Abd Portable 1v  Result Date: 09/14/2016 CLINICAL DATA:  NG tube placement. EXAM: PORTABLE ABDOMEN - 1 VIEW COMPARISON:  CT scan 09/07/2016 FINDINGS: The NG tube tip is in the body region the stomach. The bowel gas pattern is unremarkable. IMPRESSION: NG tube tip is in the body region of the stomach. Electronically Signed   By: Marijo Sanes M.D.   On: 09/14/2016 09:18   Dg Swallowing Func-speech Pathology  Result Date: 09/22/2016 Objective Swallowing Evaluation: Type  of Study: MBS-Modified Barium Swallow Study Patient Details Name: ALLYNE HEBERT MRN: [id number] Date of Birth: 20-Jul-1940 Today's Date: 09/22/2016 Time: SLP Start Time (ACUTE ONLY): 1400-SLP Stop Time (ACUTE ONLY): 1420 SLP Time Calculation (min) (ACUTE ONLY): 20 min Past Medical History: Past Medical History: Diagnosis Date . Abdominal or pelvic swelling, mass, or lump, left upper quadrant  . CHF (congestive heart failure) ([place])  . Chronic atrial fibrillation ([place])  . Chronic kidney disease  . Congestive heart failure, unspecified  . Coronary atherosclerosis of unspecified type of vessel, native or graft  . Diabetes mellitus  . Edema  . GERD (gastroesophageal reflux disease)  . Gout, unspecified  . Hypercholesterolemia  . Hypertension  . 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 . Peripheral vascular disease, unspecified ([place])  . Seizures ([place])  . Sinus of Valsalva aneurysm   a. By 2D echo 05/2011. . Stroke ([place])  . Unspecified hereditary and idiopathic peripheral neuropathy  . Unspecified urinary incontinence  . Vascular dementia, uncomplicated  Past Surgical History: Past Surgical History: Procedure Laterality Date . AORTOGRAM  09/01/2008  Abd w/ bilateral lower extremity  runoff arteriography . APPLICATION OF WOUND VAC Left 04/30/7060  Procedure: APPLICATION OF WOUND VAC LEFT GROIN;  Surgeon: Serafina Mitchell, MD;  Location: [facility];  Service: Vascular;  Laterality: Left; . ENDARTERECTOMY FEMORAL Left 08/30/2016  Procedure: LEFT COMMON FEMORAL ARTERY ENDARTERECTOMY;  Surgeon: Serafina Mitchell, MD;  Location: [place];  Service: Vascular;  Laterality: Left; . FALSE ANEURYSM REPAIR  12/06/2011  Procedure: REPAIR FALSE ANEURYSM;  Surgeon: Angelia Mould, MD;  Location: [facility];  Service: Vascular;  Laterality: Left;  Repair of left femoral Artery pseudoaneurysm . FALSE ANEURYSM REPAIR Left 08/30/2016  Procedure: REPAIR PSEUDOANEURYSM LEFT GROIN;  Surgeon: Serafina Mitchell, MD;  Location: [place];  Service: Vascular;  Laterality: Left; . FEMORAL ARTERY EXPLORATION  12/06/2011  Procedure: FEMORAL ARTERY EXPLORATION;  Surgeon: Angelia Mould, MD;  Location: [facility] OR;  Service: Vascular;  Laterality: N/A;  Exploration of large pseudoaneurysm left side of fem-fem bypass graft  . femoral to femoral bypass graft    . FEMORAL-FEMORAL BYPASS GRAFT  12/06/2011  Procedure: BYPASS GRAFT FEMORAL-FEMORAL ARTERY;  Surgeon: Angelia Mould, MD;  Location: [facility] OR;  Service: Vascular;  Laterality: Bilateral;  Revision of left to right Femoral-Femoral bypass graft . FEMORAL-FEMORAL BYPASS GRAFT Left 08/30/2016  Procedure: REDO RIGHT TO LEFT FEMORAL-FEMORAL ARTERY BYPASS GRAFT;  Surgeon: Serafina Mitchell, MD;  Location: [place];  Service: Vascular;  Laterality: Left; . FEMORAL-POPLITEAL BYPASS GRAFT Left 05/2002  lower extremity femoral to below knee w/ non-versed greater saphenous vein . FEMORAL-POPLITEAL BYPASS GRAFT Left 11/2002 . FEMORAL-POPLITEAL BYPASS GRAFT Left 08/30/2016  Procedure: REVISION OF LEFT FEMORAL-POPLITEAL ARTERY BYPASS GRAFT;  Surgeon: Serafina Mitchell, MD;  Location: [place];  Service: Vascular;  Laterality: Left; . THROMBECTOMY FEMORAL ARTERY Left 08/30/2016  Procedure: THROMBECTOMY OF  FEMORAL-FEMORAL ARTERY BYPASS GRAFT AND LEFT FEMORAL TO LEFT POPLITEAL BYPASS GRAFT;  Surgeon: Serafina Mitchell, MD;  Location: [place];  Service: Vascular;  Laterality: Left; . TUBAL LIGATION Bilateral  HPI: Jennifier A Kittrellis a 76 y.o.femalewith a past medical history significant for lymphoma, dementia, pAF not on anticoag, CVA with R hemiparesis, CHF EF 35%, HTN, PVD s/p fem-fem bypass, seizures and legally blindwho presents with AMS, Pt found to have pna and required intubation  from 8/17-8/22.  Swallow evaluation ordered.  Subjective: upright in Ha[place] chair Assessment / Plan / Recommendation CHL IP CLINICAL IMPRESSIONS 09/22/2016 Clinical Impression Patient presents with mild oral, moderate pharyngeal and suspected primary esophageal dysphagia. Deviations in swallow physiology include impaired pharyngeal sensation which is likely multifactorial given pt's intubation, hx of GERD. This resulted in delayed swallow initiation with liquids, which at times did not occur until liquids were already in pt's cervical esophagus (UES open at rest, NG present). This resulted in 1 episode of trace, sensed aspiration of thin liquids which ocurred during the swallow. Oral stage was largely functional with the exception of pt's difficulty transiting barium tablet; she was unable to swallow whole or when broken into smaller pieces, though this is likely secondary to esophageal dysphagia, as pt was repeatedly belching and gagging slightly during attempts. Pharyngeal stage characterized by adequate tongue base retraction and hyolaryngeal excursion, reduced pharyngeal stripping wave suspect secondary to suboptimal esophageal pressures. Cervical esophageal phase notable for intermittent backflow into the cervical esophagus and pharynx with thin and nectar thick liquids, regular solids. Frequent wet belching with thin liquids as well as delayed coughing which did not appear to be associated with penetration/aspiration events,  however penetration/aspiration of esophageal backflow between frames not excluded. An esophageal sweep with nectar thick liquids revealed a standing column of barium in the upper esophagus, with retrograde bolus flow through the UES into the pharynx. Recommend pt remain NPO with NG at this time; she may benefit from GI referral, esophageal workup. SLP will f/u for readiness for PO/repeat instrumental pending follow-up for her suspected primary esophageal dysphagia.  SLP Visit Diagnosis Dysphagia, oropharyngeal phase (R13.12);Dysphagia, pharyngoesophageal phase (R13.14) Attention and concentration deficit following -- Frontal lobe and executive function deficit following -- Impact on safety and function Severe aspiration risk   CHL IP TREATMENT RECOMMENDATION 09/22/2016 Treatment Recommendations F/U MBS in --- days (Comment);Therapy as outlined in treatment plan below   Prognosis 09/22/2016 Prognosis for Safe Diet Advancement Guarded Barriers to Reach Goals Severity of deficits;Cognitive deficits Barriers/Prognosis Comment -- CHL IP DIET RECOMMENDATION 09/22/2016 SLP Diet Recommendations NPO;Alternative means - temporary Liquid Administration via -- Medication Administration Via alternative means Compensations -- Postural Changes --   CHL IP OTHER RECOMMENDATIONS 09/22/2016 Recommended Consults Consider GI evaluation;Consider esophageal assessment Oral Care Recommendations Oral care QID Other Recommendations --   CHL IP FOLLOW UP RECOMMENDATIONS 09/22/2016 Follow up Recommendations Other (comment)   CHL IP FREQUENCY AND DURATION 09/22/2016 Speech Therapy Frequency (ACUTE ONLY) min 2x/week Treatment Duration 2 weeks      CHL IP ORAL PHASE 09/22/2016 Oral Phase Impaired Oral - Pudding Teaspoon -- Oral - Pudding Cup -- Oral - Honey Teaspoon -- Oral - Honey Cup -- Oral - Nectar Teaspoon -- Oral - Nectar Cup -- Oral - Nectar Straw -- Oral - Thin Teaspoon -- Oral - Thin Cup -- Oral - Thin Straw -- Oral - Puree -- Oral - Mech Soft  -- Oral - Regular -- Oral - Multi-Consistency -- Oral - Pill Lingual pumping;Reduced posterior propulsion;Holding of bolus;Lingual/palatal residue Oral Phase - Comment --  CHL IP PHARYNGEAL PHASE 09/22/2016 Pharyngeal Phase Impaired Pharyngeal- Pudding Teaspoon -- Pharyngeal -- Pharyngeal- Pudding Cup -- Pharyngeal -- Pharyngeal- Honey Teaspoon -- Pharyngeal -- Pharyngeal- Honey Cup -- Pharyngeal -- Pharyngeal- Nectar Teaspoon -- Pharyngeal -- Pharyngeal- Nectar Cup Delayed swallow initiation-pyriform sinuses Pharyngeal -- Pharyngeal- Nectar Straw -- Pharyngeal -- Pharyngeal- Thin Teaspoon -- Pharyngeal -- Pharyngeal- Thin Cup Delayed swallow initiation-pyriform sinuses;Reduced pharyngeal peristalsis Pharyngeal -- Pharyngeal-  Thin Straw Reduced pharyngeal peristalsis Pharyngeal -- Pharyngeal- Puree Delayed swallow initiation-vallecula;Reduced pharyngeal peristalsis Pharyngeal -- Pharyngeal- Mechanical Soft -- Pharyngeal -- Pharyngeal- Regular Delayed swallow initiation-vallecula;Reduced pharyngeal peristalsis Pharyngeal -- Pharyngeal- Multi-consistency -- Pharyngeal -- Pharyngeal- Pill -- Pharyngeal -- Pharyngeal Comment --  CHL IP CERVICAL ESOPHAGEAL PHASE 09/22/2016 Cervical Esophageal Phase Impaired Pudding Teaspoon -- Pudding Cup -- Honey Teaspoon -- Honey Cup -- Nectar Teaspoon -- Nectar Cup Esophageal backflow into cervical esophagus;Esophageal backflow into the pharynx Nectar Straw -- Thin Teaspoon -- Thin Cup Esophageal backflow into cervical esophagus Thin Straw Esophageal backflow into cervical esophagus Puree Esophageal backflow into cervical esophagus Mechanical Soft -- Regular Esophageal backflow into the pharynx;Esophageal backflow into cervical esophagus Multi-consistency -- Pill -- Cervical Esophageal Comment -- Deneise Lever, MS, CCC-SLP Speech-Language Pathologist [phone] No flowsheet data found. Aliene Altes 09/22/2016, 3:44 PM               Time Spent in minutes  30   Jani Gravel M.D on  09/24/2016 at 7:20 AM  Between 7am to 7pm - Pager - [phone]  After 7pm go to [email or web address] - password [facility]  [facility] -  Office  [phone]

## 2016-09-24 NOTE — Progress Notes (Signed)
Patient continues to refuse placement of NG feeding tube stating " she will figure it out". I have educated her on the reasons the feeding tube is important however patient is not accepting. At oncoming shift patient also didn't want her blood sugar checked due to being stuck, however after educating her she allowed [place] to continue with CBG and insulin. On call MD aware, will continue to monitor patient closely.

## 2016-09-24 NOTE — Progress Notes (Signed)
I received a consultation request from Dr. Maudie Mercury for a PEG tube placement.  I reviewed the patient's chart and there is a family meeting tomorrow.  Pending the decisions from the family meeting I will hold off on a PEG tube placement.

## 2016-09-24 NOTE — Progress Notes (Signed)
Progress Note  Patient Name: Casey [name] Date of Encounter: 09/24/2016  Primary Cardiologist: Dr. Radford Pax  Subjective   Denies chest pain.  No SOB  Inpatient Medications    Scheduled Meds: . allopurinol  100 mg Per Tube Daily  . aspirin  81 mg Per Tube Daily  . atorvastatin  10 mg Per Tube QHS  . bacitracin  1 application Topical BID  . chlorhexidine gluconate (MEDLINE KIT)  15 mL Mouth Rinse BID  . donepezil  10 mg Per Tube QHS  . feeding supplement (JEVITY 1.2 CAL)  1,000 mL Per Tube Q24H  . feeding supplement (PRO-STAT SUGAR FREE 64)  30 mL Per Tube BID  . free water  100 mL Per Tube Q4H  . furosemide  40 mg Intravenous BID  . insulin aspart  0-15 Units Subcutaneous Q4H  . insulin glargine  10 Units Subcutaneous Daily  . lactobacillus  1 g Per Tube TID WC  . levETIRAcetam  250 mg Per Tube BID  . losartan  50 mg Per NG tube Daily  . mouth rinse  15 mL Mouth Rinse QID  . metoprolol tartrate  5 mg Intravenous Q6H  . multivitamin  15 mL Per Tube Daily  . pantoprazole sodium  40 mg Per Tube Q24H  . sertraline  50 mg Per Tube QHS  . sodium chloride flush  10-40 mL Intracatheter Q12H  . warfarin  5 mg Per Tube STAT  . Warfarin - Pharmacist Dosing Inpatient   Does not apply q1800   Continuous Infusions: . heparin 850 Units/hr (09/24/16 0936)   PRN Meds: acetaminophen, bisacodyl, glycopyrrolate, ipratropium-albuterol, sodium chloride flush   Vital Signs    Vitals:   09/23/16 2335 09/24/16 0208 09/24/16 0450 09/24/16 0500  BP: 132/78 125/76 (!) 153/71   Pulse: (!) 113 93 74   Resp: (!) 25 (!) 26 (!) 24   Temp: 98.3 F (36.8 C) 98.8 F (37.1 C) 97.8 F (36.6 C)   TempSrc: Oral Oral Oral   SpO2: 100% 96% 98%   Weight:    134 lb 14.7 oz (61.2 kg)  Height:        Intake/Output Summary (Last 24 hours) at 09/24/16 1021 Last data filed at 09/24/16 0450  Gross per 24 hour  Intake           168.44 ml  Output             2600 ml  Net         -2431.56 ml    Filed Weights   09/22/16 0400 09/23/16 0455 09/24/16 0500  Weight: 135 lb 9.3 oz (61.5 kg) 132 lb 4.4 oz (60 kg) 134 lb 14.7 oz (61.2 kg)    Telemetry    Atrial fib.  Wide complex NST probably aberrant conduction - Personally Reviewed  ECG    N - Personally Reviewed  Physical Exam   GEN: No  acute distress.   Neck: No  JVD Cardiac: IrregularRR, no murmurs, rubs, or gallops.  Respiratory: Clear   to auscultation bilaterally. GI: Soft, nontender, non-distended, normal bowel sounds  MS:  No edema; No deformity. Neuro:   Nonfocal  Psych: Oriented and appropriate    Labs    Chemistry  Recent Labs Lab 09/21/16 0447 09/22/16 0500 09/23/16 0435  NA 139 142 140  K 3.8 3.5 3.2*  CL 104 104 100*  CO2 28 31 33*  GLUCOSE 237* 132* 202*  BUN 33* 37* 33*  CREATININE 1.21* 1.32* 1.25*  CALCIUM 9.9 9.9 9.6  GFRNONAA 42* 38* 41*  GFRAA 49* 44* 47*  ANIONGAP '7 7 7     ' Hematology  Recent Labs Lab 09/22/16 0500 09/23/16 0435 09/24/16 0417  WBC 9.9 10.2 11.0*  RBC 3.32* 3.32* 3.44*  HGB 8.7* 8.8* 9.0*  HCT 27.4* 27.3* 28.1*  MCV 82.5 82.2 81.7  MCH 26.2 26.5 26.2  MCHC 31.8 32.2 32.0  RDW 16.9* 16.7* 16.6*  PLT 284 286 296    Cardiac EnzymesNo results for input(s): TROPONINI in the last 168 hours. No results for input(s): TROPIPOC in the last 168 hours.   BNPNo results for input(s): BNP, PROBNP in the last 168 hours.   DDimer No results for input(s): DDIMER in the last 168 hours.   Radiology    Dg Chest Port 1 View  Result Date: 09/24/2016 CLINICAL DATA:  Shortness breath, CHF EXAM: PORTABLE CHEST 1 VIEW COMPARISON:  09/22/2016 FINDINGS: Left PICC line remains in place, unchanged. Cardiomegaly with vascular congestion, improving since prior study. Improving aeration in the lung bases. No overt edema, effusions or confluent opacities. IMPRESSION: Cardiomegaly, vascular congestion. Vascular congestion has improved since prior study with improving aeration in the  lung bases. Electronically Signed   By: Rolm Baptise M.D.   On: 09/24/2016 07:49   Dg Abd Portable 1v  Result Date: 09/22/2016 CLINICAL DATA:  Check feeding catheter placement EXAM: PORTABLE ABDOMEN - 1 VIEW COMPARISON:  None. FINDINGS: Feeding catheter is noted in the distal stomach. Contrast material is noted throughout the colon. No other focal abnormality is noted. IMPRESSION: Feeding catheter within the stomach. Electronically Signed   By: Inez Catalina M.D.   On: 09/22/2016 17:43   Dg Swallowing Func-speech Pathology  Result Date: 09/22/2016 Objective Swallowing Evaluation: Type of Study: MBS-Modified Barium Swallow Study Patient Details Name: Casey [name] MRN: [id number] Date of Birth: Mar 16, 1940 Today's Date: 09/22/2016 Time: SLP Start Time (ACUTE ONLY): 1400-SLP Stop Time (ACUTE ONLY): 1420 SLP Time Calculation (min) (ACUTE ONLY): 20 min Past Medical History: Past Medical History: Diagnosis Date . Abdominal or pelvic swelling, mass, or lump, left upper quadrant  . CHF (congestive heart failure) ([place])  . Chronic atrial fibrillation ([place])  . Chronic kidney disease  . Congestive heart failure, unspecified  . Coronary atherosclerosis of unspecified type of vessel, native or graft  . Diabetes mellitus  . Edema  . GERD (gastroesophageal reflux disease)  . Gout, unspecified  . Hypercholesterolemia  . Hypertension  . 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 . Peripheral vascular disease, unspecified ([place])  . Seizures ([place])  . Sinus of Valsalva aneurysm   a. By 2D echo 05/2011. . Stroke ([place])  . Unspecified hereditary and idiopathic peripheral neuropathy  . Unspecified urinary incontinence  . Vascular dementia, uncomplicated  Past Surgical History: Past Surgical History: Procedure Laterality Date . AORTOGRAM  09/01/2008  Abd w/ bilateral lower extremity runoff  arteriography . APPLICATION OF WOUND VAC Left 4/0/9811  Procedure: APPLICATION OF WOUND VAC LEFT GROIN;  Surgeon: Serafina Mitchell, MD;  Location: [address];  Service: Vascular;  Laterality: Left; . ENDARTERECTOMY FEMORAL Left 08/30/2016  Procedure: LEFT COMMON FEMORAL ARTERY ENDARTERECTOMY;  Surgeon: Serafina Mitchell, MD;  Location: [place];  Service: Vascular;  Laterality: Left; . FALSE ANEURYSM REPAIR  12/06/2011  Procedure: REPAIR FALSE ANEURYSM;  Surgeon: Angelia Mould, MD;  Location: [place];  Service: Vascular;  Laterality: Left;  Repair of left femoral Artery pseudoaneurysm . FALSE ANEURYSM REPAIR Left 08/30/2016  Procedure: REPAIR PSEUDOANEURYSM LEFT GROIN;  Surgeon: Serafina Mitchell, MD;  Location: [place];  Service: Vascular;  Laterality: Left; . FEMORAL ARTERY EXPLORATION  12/06/2011  Procedure: FEMORAL ARTERY EXPLORATION;  Surgeon: Angelia Mould, MD;  Location: [facility] OR;  Service: Vascular;  Laterality: N/A;  Exploration of large pseudoaneurysm left side of fem-fem bypass graft  . femoral to femoral bypass graft    . FEMORAL-FEMORAL BYPASS GRAFT  12/06/2011  Procedure: BYPASS GRAFT FEMORAL-FEMORAL ARTERY;  Surgeon: Angelia Mould, MD;  Location: [facility] OR;  Service: Vascular;  Laterality: Bilateral;  Revision of left to right Femoral-Femoral bypass graft . FEMORAL-FEMORAL BYPASS GRAFT Left 08/30/2016  Procedure: REDO RIGHT TO LEFT FEMORAL-FEMORAL ARTERY BYPASS GRAFT;  Surgeon: Serafina Mitchell, MD;  Location: [place];  Service: Vascular;  Laterality: Left; . FEMORAL-POPLITEAL BYPASS GRAFT Left 05/2002  lower extremity femoral to below knee w/ non-versed greater saphenous vein . FEMORAL-POPLITEAL BYPASS GRAFT Left 11/2002 . FEMORAL-POPLITEAL BYPASS GRAFT Left 08/30/2016  Procedure: REVISION OF LEFT FEMORAL-POPLITEAL ARTERY BYPASS GRAFT;  Surgeon: Serafina Mitchell, MD;  Location: [place];  Service: Vascular;  Laterality: Left; . THROMBECTOMY FEMORAL ARTERY Left 08/30/2016  Procedure: THROMBECTOMY OF FEMORAL-FEMORAL  ARTERY BYPASS GRAFT AND LEFT FEMORAL TO LEFT POPLITEAL BYPASS GRAFT;  Surgeon: Serafina Mitchell, MD;  Location: [place];  Service: Vascular;  Laterality: Left; . TUBAL LIGATION Bilateral  HPI: Casey A Kittrellis a 76 y.o.femalewith a past medical history significant for lymphoma, dementia, pAF not on anticoag, CVA with R hemiparesis, CHF EF 35%, HTN, PVD s/p fem-fem bypass, seizures and legally blindwho presents with AMS, Pt found to have pna and required intubation from 8/17-8/22.  Swallow evaluation ordered.  Subjective: upright in Ha[place] chair Assessment / Plan / Recommendation CHL IP CLINICAL IMPRESSIONS 09/22/2016 Clinical Impression Patient presents with mild oral, moderate pharyngeal and suspected primary esophageal dysphagia. Deviations in swallow physiology include impaired pharyngeal sensation which is likely multifactorial given pt's intubation, hx of GERD. This resulted in delayed swallow initiation with liquids, which at times did not occur until liquids were already in pt's cervical esophagus (UES open at rest, NG present). This resulted in 1 episode of trace, sensed aspiration of thin liquids which ocurred during the swallow. Oral stage was largely functional with the exception of pt's difficulty transiting barium tablet; she was unable to swallow whole or when broken into smaller pieces, though this is likely secondary to esophageal dysphagia, as pt was repeatedly belching and gagging slightly during attempts. Pharyngeal stage characterized by adequate tongue base retraction and hyolaryngeal excursion, reduced pharyngeal stripping wave suspect secondary to suboptimal esophageal pressures. Cervical esophageal phase notable for intermittent backflow into the cervical esophagus and pharynx with thin and nectar thick liquids, regular solids. Frequent wet belching with thin liquids as well as delayed coughing which did not appear to be associated with penetration/aspiration events, however  penetration/aspiration of esophageal backflow between frames not excluded. An esophageal sweep with nectar thick liquids revealed a standing column of barium in the upper esophagus, with retrograde bolus flow through the UES into the pharynx. Recommend pt remain NPO with NG at this time; she may benefit from GI referral, esophageal workup. SLP will f/u for readiness for PO/repeat instrumental pending follow-up for her suspected primary esophageal dysphagia.  SLP Visit Diagnosis Dysphagia, oropharyngeal phase (R13.12);Dysphagia, pharyngoesophageal phase (R13.14) Attention and concentration deficit following -- Frontal lobe and executive function  deficit following -- Impact on safety and function Severe aspiration risk   CHL IP TREATMENT RECOMMENDATION 09/22/2016 Treatment Recommendations F/U MBS in --- days (Comment);Therapy as outlined in treatment plan below   Prognosis 09/22/2016 Prognosis for Safe Diet Advancement Guarded Barriers to Reach Goals Severity of deficits;Cognitive deficits Barriers/Prognosis Comment -- CHL IP DIET RECOMMENDATION 09/22/2016 SLP Diet Recommendations NPO;Alternative means - temporary Liquid Administration via -- Medication Administration Via alternative means Compensations -- Postural Changes --   CHL IP OTHER RECOMMENDATIONS 09/22/2016 Recommended Consults Consider GI evaluation;Consider esophageal assessment Oral Care Recommendations Oral care QID Other Recommendations --   CHL IP FOLLOW UP RECOMMENDATIONS 09/22/2016 Follow up Recommendations Other (comment)   CHL IP FREQUENCY AND DURATION 09/22/2016 Speech Therapy Frequency (ACUTE ONLY) min 2x/week Treatment Duration 2 weeks      CHL IP ORAL PHASE 09/22/2016 Oral Phase Impaired Oral - Pudding Teaspoon -- Oral - Pudding Cup -- Oral - Honey Teaspoon -- Oral - Honey Cup -- Oral - Nectar Teaspoon -- Oral - Nectar Cup -- Oral - Nectar Straw -- Oral - Thin Teaspoon -- Oral - Thin Cup -- Oral - Thin Straw -- Oral - Puree -- Oral - Mech Soft -- Oral  - Regular -- Oral - Multi-Consistency -- Oral - Pill Lingual pumping;Reduced posterior propulsion;Holding of bolus;Lingual/palatal residue Oral Phase - Comment --  CHL IP PHARYNGEAL PHASE 09/22/2016 Pharyngeal Phase Impaired Pharyngeal- Pudding Teaspoon -- Pharyngeal -- Pharyngeal- Pudding Cup -- Pharyngeal -- Pharyngeal- Honey Teaspoon -- Pharyngeal -- Pharyngeal- Honey Cup -- Pharyngeal -- Pharyngeal- Nectar Teaspoon -- Pharyngeal -- Pharyngeal- Nectar Cup Delayed swallow initiation-pyriform sinuses Pharyngeal -- Pharyngeal- Nectar Straw -- Pharyngeal -- Pharyngeal- Thin Teaspoon -- Pharyngeal -- Pharyngeal- Thin Cup Delayed swallow initiation-pyriform sinuses;Reduced pharyngeal peristalsis Pharyngeal -- Pharyngeal- Thin Straw Reduced pharyngeal peristalsis Pharyngeal -- Pharyngeal- Puree Delayed swallow initiation-vallecula;Reduced pharyngeal peristalsis Pharyngeal -- Pharyngeal- Mechanical Soft -- Pharyngeal -- Pharyngeal- Regular Delayed swallow initiation-vallecula;Reduced pharyngeal peristalsis Pharyngeal -- Pharyngeal- Multi-consistency -- Pharyngeal -- Pharyngeal- Pill -- Pharyngeal -- Pharyngeal Comment --  CHL IP CERVICAL ESOPHAGEAL PHASE 09/22/2016 Cervical Esophageal Phase Impaired Pudding Teaspoon -- Pudding Cup -- Honey Teaspoon -- Honey Cup -- Nectar Teaspoon -- Nectar Cup Esophageal backflow into cervical esophagus;Esophageal backflow into the pharynx Nectar Straw -- Thin Teaspoon -- Thin Cup Esophageal backflow into cervical esophagus Thin Straw Esophageal backflow into cervical esophagus Puree Esophageal backflow into cervical esophagus Mechanical Soft -- Regular Esophageal backflow into the pharynx;Esophageal backflow into cervical esophagus Multi-consistency -- Pill -- Cervical Esophageal Comment -- Deneise Lever, MS, CCC-SLP Speech-Language Pathologist [phone] No flowsheet data found. Aliene Altes 09/22/2016, 3:44 PM               Cardiac Studies   Echo 8/11 - Left ventricle: The cavity  size was normal. Wall thickness was   increased in a pattern of mild LVH. Systolic function was   severely reduced. The estimated ejection fraction was in the   range of 25% to 30%. Diffuse hypokinesis. - Aortic valve: There was mild regurgitation. - Mitral valve: There was mild regurgitation. - Left atrium: The atrium was severely dilated. - Right atrium: The atrium was severely dilated. - Tricuspid valve: There was moderate regurgitation. - Pulmonary arteries: Systolic pressure was moderately increased.   PA peak pressure: 53 mm Hg (S  Patient Profile     76 y.o. female with a hx of CAF-(not anticoagulated), prior CVA,  dementia, and cardiomyopathy who is being seen for the evaluation of NSVT, AF with RVR,  and CHF at the request of Dr Halford Chessman.  Assessment & Plan    ATRIAL FIB:  Rate is controlled.    Ms. Casey [name] has a CHA2DS2 - VASc score of 8.  On heparin and getting coumadin INR 1.57 plan per pharmacy   [facility] ON CHRONIC SYSTOLIC HF: losratan dose increased improved   Will sign off nothing else to add   Signed, Jenkins Rouge, MD  09/24/2016, 10:21 AM

## 2016-09-24 NOTE — Progress Notes (Signed)
ANTICOAGULATION CONSULT NOTE - Follow Up Consult  Pharmacy Consult for Heparin, Warfarin Indication: atrial fibrillation  Allergies  Allergen Reactions  . Penicillins Other (See Comments)    Told by a doctor to "not take" and on MAR as an allergy Has patient had a PCN reaction causing immediate rash, facial/tongue/throat swelling, SOB or lightheadedness with hypotension: Unknown Has patient had a PCN reaction causing severe rash involving mucus membranes or skin necrosis: Unknown Has patient had a PCN reaction that required hospitalization: Unknown Has patient had a PCN reaction occurring within the last 10 years: Unknown If all of the above answers are "NO", then may proceed with Ceph    Patient Measurements: Height: 5\' 3"  (160 cm) Weight: 134 lb 14.7 oz (61.2 kg) IBW/kg (Calculated) : 52.4 Heparin Dosing Weight: 64 kg  Vital Signs: Temp: 97.8 F (36.6 C) (08/27 0450) Temp Source: Oral (08/27 0450) BP: 153/71 (08/27 0450) Pulse Rate: 74 (08/27 0450)  Labs:  Recent Labs  09/22/16 0500  09/23/16 0435 09/23/16 1047 09/23/16 2211 09/24/16 0417 09/24/16 0420  HGB 8.7*  --  8.8*  --   --  9.0*  --   HCT 27.4*  --  27.3*  --   --  28.1*  --   PLT 284  --  286  --   --  296  --   LABPROT 17.4*  --  19.5*  --   --  19.0*  --   INR 1.41  --  1.63  --   --  1.57  --   HEPARINUNFRC 0.16*  < >  --  0.47 0.56  --  0.52  CREATININE 1.32*  --  1.25*  --   --   --   --   < > = values in this interval not displayed.  Estimated Creatinine Clearance: 31.7 mL/min (A) (by C-G formula based on SCr of 1.25 mg/dL (H)).   Medications:  Infusions:  . heparin 850 Units/hr (09/24/16 0936)    Assessment: 76 yo female with hx of Afib (NO anticoagulation PTA), dementia, CVA, seizures, bedbound, recent repair of left femoral pseduo aneurysm complictaed with infection at the surgical site treated with doxycycline now admitted with subjective fever cough shortness of breath and generalized  body aches. On tele, found to be in Afib with RVR and started on cardizem drip which was stopped due to hypotension.  Pharmacy is consulted to dose IV heparin and warfarin for anticoagulation for afib.  Significant events:  Pt pulled out NGT and refuses to have replaced, MD made aware  Palliative meeting scheduled for 8/28  09/24/2016 Heparin level= 0.52 on 850 units/hr INR 1.57 subtherapeutic, has not received since 8/23 H/H low but stable Plts WNL No bleeding per RN  Goal of Therapy:  INR 2-3 Heparin level 0.3-0.7 units/ml Monitor platelets by anticoagulation protocol: Yes   Plan:   Continue IV heparin drip at 850 units/hr  Daily heparin level, INR, and CBC  Warfarin 6mg  po x1 tonight, if pt will not be able to take po meds suggest switching to therapeutic lovenox  Patient will need a minimum of 5 days warfarin / Heparin overlap and until INR > 2 for 24 hours.    Dolly Rias RPh 09/24/2016, 11:36 AM Pager [phone]

## 2016-09-24 NOTE — Progress Notes (Signed)
Pt admitted from SNF. Pt daughter and son to meet with Palliative Medicine Team tomorrow 09/25/16 for [place]. CM will continue to follow and assist with disposition planning as needed. Marney Doctor RN,BSN,NCM [phone]

## 2016-09-24 NOTE — Consult Note (Signed)
Consultation Note Date: 09/24/2016   Patient Name: Casey [name]  DOB: 04-02-1940  MRN: [id number]  Age / Sex: 76 y.o., female  PCP: Gildardo Cranker, DO Referring Physician: Jani Gravel, MD  Reason for Consultation: Establishing goals of care  HPI/Patient Profile:76 y.o.female with a history of CVA with right hemiparesis, seizures, dementia, chronic Foley, recent repair of left femoral pseudoaneurysm, legally blind. She presented with fever and body aches and found to have HCAP. She developed respiratory failure requiring intubation and was extubated when medically appropriate. She is noted to have dysphagia and had a NGT placed which she subsequently removed herself.   Clinical Assessment and Goals of Care: Casey [name] is a pleasantly demented patient. She does not know where she is, why she is here, or if she has children. She asked "have I ate breakfast yet?"   Casey [name] had a MBS recommending NPO status. Question of plans for nutrition from here.   Spoke with daughter Casey [name] at [phone] this morning. She will contact her brother with plans to come tomorrow for family meeting regarding goals of care.    NEXT OF KIN    SUMMARY OF RECOMMENDATIONS   Family meeting tomorrow with children for [place]. Daughter to call with time for meeting.    Code Status/Advance Care Planning:  Full code  Prognosis:   Unable to determine Depends on outcome of discussion tomorrow with family, and plans for comfort measures or aggressive treatment.   Discharge Planning: To Be Determined      Primary Diagnoses: Present on Admission: . PNA (pneumonia) . Pneumonia due to gram-positive bacteria . Pneumonia   I have reviewed the medical record, interviewed the patient and family, and examined the patient. The following aspects are pertinent.  Past Medical History:  Diagnosis Date  . Abdominal or  pelvic swelling, mass, or lump, left upper quadrant   . CHF (congestive heart failure) ([place])   . Chronic atrial fibrillation ([place])   . Chronic kidney disease   . Congestive heart failure, unspecified   . Coronary atherosclerosis of unspecified type of vessel, native or graft   . Diabetes mellitus   . Edema   . GERD (gastroesophageal reflux disease)   . Gout, unspecified   . Hypercholesterolemia   . Hypertension   . 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  . Peripheral vascular disease, unspecified ([place])   . Seizures ([place])   . Sinus of Valsalva aneurysm    a. By 2D echo 05/2011.  . Stroke ([place])   . Unspecified hereditary and idiopathic peripheral neuropathy   . Unspecified urinary incontinence   . Vascular dementia, uncomplicated    Social History   Social History  . Marital status: Widowed    Spouse name: N/A  . Number of children: N/A  . Years of education: N/A   Social History Main Topics  . Smoking status:  Former Smoker    Types: Cigarettes    Quit date: 01/29/2001  . Smokeless tobacco: Never Used  . Alcohol use No  . Drug use: No  . Sexual activity: No   Other Topics Concern  . None   Social History Narrative  . None   Family History  Problem Relation Age of Onset  . Cancer Mother        Cervical  . Stroke Mother   . Diabetes Mother   . Stroke Father   . Heart disease Father    Scheduled Meds: . allopurinol  100 mg Per Tube Daily  . aspirin  81 mg Per Tube Daily  . atorvastatin  10 mg Per Tube QHS  . bacitracin  1 application Topical BID  . chlorhexidine gluconate (MEDLINE KIT)  15 mL Mouth Rinse BID  . donepezil  10 mg Per Tube QHS  . feeding supplement (JEVITY 1.2 CAL)  1,000 mL Per Tube Q24H  . feeding supplement (PRO-STAT SUGAR FREE 64)  30 mL Per Tube BID  . free water  100 mL Per Tube Q4H  .  furosemide  40 mg Intravenous BID  . insulin aspart  0-15 Units Subcutaneous Q4H  . insulin glargine  10 Units Subcutaneous Daily  . lactobacillus  1 g Per Tube TID WC  . levETIRAcetam  250 mg Per Tube BID  . losartan  50 mg Per NG tube Daily  . mouth rinse  15 mL Mouth Rinse QID  . metoprolol tartrate  5 mg Intravenous Q6H  . multivitamin  15 mL Per Tube Daily  . pantoprazole sodium  40 mg Per Tube Q24H  . sertraline  50 mg Per Tube QHS  . sodium chloride flush  10-40 mL Intracatheter Q12H  . warfarin  5 mg Per Tube STAT  . Warfarin - Pharmacist Dosing Inpatient   Does not apply q1800   Continuous Infusions: . heparin 850 Units/hr (09/24/16 0936)   PRN Meds:.acetaminophen, bisacodyl, glycopyrrolate, ipratropium-albuterol, sodium chloride flush Medications Prior to Admission:  Prior to Admission medications   Medication Sig Start Date End Date Taking? Authorizing Provider  acetaminophen (TYLENOL) 325 MG tablet Take 2 tablets (650 mg total) by mouth every 6 (six) hours as needed for mild pain (or Fever >/= 101). 09/02/16  Yes Osei-Bonsu, Iona Beard, MD  allopurinol (ZYLOPRIM) 100 MG tablet Take 100 mg by mouth daily.   Yes [provider]  aspirin 81 MG tablet Take 81 mg by mouth daily.   Yes [provider]  atorvastatin (LIPITOR) 10 MG tablet Take 10 mg by mouth at bedtime.    Yes [provider]  bisacodyl (BISCOLAX) 10 MG suppository Place 10 mg rectally as needed for moderate constipation.   Yes [provider]  CETYLPYRIDINIUM CHLORIDE MT Give 15 ml by mouth two times a day for oral irritation   Yes [provider]  donepezil (ARICEPT) 10 MG tablet Take 1 tablet (10 mg total) by mouth at bedtime. 02/07/13  Yes Isaac Bliss, Rayford Halsted, MD  guaiFENesin-dextromethorphan (ROBITUSSIN DM) 100-10 MG/5ML syrup Take 15 mLs by mouth every 4 (four) hours as needed for cough. 09/02/16  Yes Osei-Bonsu, Iona Beard, MD  levETIRAcetam (KEPPRA) 250 MG tablet Take  250 mg by mouth 2 (two) times daily.   Yes [provider]  metoprolol tartrate (LOPRESSOR) 25 MG tablet Give 2 tablets (43m) by mouth in the morning and 1 tablet by mouth at bedtime   Yes [provider]  mirtazapine (REMERON) 7.5  MG tablet Take 7.5 mg by mouth at bedtime.   Yes [provider]  oxyCODONE (OXY IR/ROXICODONE) 5 MG immediate release tablet Take 1 tablet (5 mg total) by mouth every 6 (six) hours as needed for severe pain. 09/09/16  Yes Dhungel, Nishant, MD  pantoprazole (PROTONIX) 40 MG tablet Take 40 mg by mouth daily.   Yes [provider]  sertraline (ZOLOFT) 50 MG tablet Take 50 mg by mouth at bedtime.    Yes [provider]   Allergies  Allergen Reactions  . Penicillins Other (See Comments)    Told by a doctor to "not take" and on MAR as an allergy Has patient had a PCN reaction causing immediate rash, facial/tongue/throat swelling, SOB or lightheadedness with hypotension: Unknown Has patient had a PCN reaction causing severe rash involving mucus membranes or skin necrosis: Unknown Has patient had a PCN reaction that required hospitalization: Unknown Has patient had a PCN reaction occurring within the last 10 years: Unknown If all of the above answers are "NO", then may proceed with Ceph   Review of Systems  Constitutional:       No complaints  Psychiatric/Behavioral: Positive for confusion.    Physical Exam  Constitutional: No distress.  HENT:  Head: Normocephalic and atraumatic.  Cardiovascular:  Warm and dry  Pulmonary/Chest: Effort normal.  Abdominal: Soft. She exhibits no distension.  Musculoskeletal: She exhibits no edema.  Neurological: She is alert.  Confused, dementia at baseline.   Skin: No rash noted.    Vital Signs: BP (!) 153/71 (BP Location: Right Arm)   Pulse 74   Temp 97.8 F (36.6 C) (Oral)   Resp (!) 24   Ht '5\' 3"'  (1.6 m)   Wt 61.2 kg (134 lb 14.7 oz)   SpO2 98%   BMI 23.90 kg/m  Pain  Assessment: No/denies pain POSS *See Group Information*: 1-Acceptable,Awake and alert Pain Score: 0-No pain   SpO2: SpO2: 98 % O2 Device:SpO2: 98 % O2 Flow Rate: .O2 Flow Rate (L/min): 2 L/min  IO: Intake/output summary:  Intake/Output Summary (Last 24 hours) at 09/24/16 1026 Last data filed at 09/24/16 0450  Gross per 24 hour  Intake           168.44 ml  Output             2600 ml  Net         -2431.56 ml    LBM: Last BM Date: 09/24/16 Baseline Weight: Weight: 64.1 kg (141 lb 5 oz) Most recent weight: Weight: 61.2 kg (134 lb 14.7 oz)     Palliative Assessment/Data: 20%     Time In: 9:50 Time Out: 10:40 Time Total: 50 minutes Greater than 50%  of this time was spent counseling and coordinating care related to the above assessment and plan.  Signed by: Asencion Gowda, NP   Please contact Palliative Medicine Team phone at [phone] for questions and concerns.  For individual provider: See Amion   Addendum: Agree with above note Patient seen and examined along with Ms Laurann Montana.  Await family meeting tomorrow for additional recommendations.  Thank you for the consult.  Loistine Chance MD [phone]

## 2016-09-24 NOTE — Care Management Important Message (Signed)
Important Message  Patient Details  Name: ANTONELA FREIMAN MRN: [id number] Date of Birth: 03/02/1940   Medicare Important Message Given:  Yes    Kerin Salen 09/24/2016, 11:58 AMImportant Message  Patient Details  Name: CHRISHONDA HESCH MRN: [id number] Date of Birth: December 14, 1940   Medicare Important Message Given:  Yes    Kerin Salen 09/24/2016, 11:58 AM

## 2016-09-25 ENCOUNTER — Inpatient Hospital Stay (HOSPITAL_COMMUNITY): Payer: Medicare Other

## 2016-09-25 DIAGNOSIS — Z7189 Other specified counseling: Secondary | ICD-10-CM

## 2016-09-25 DIAGNOSIS — M7989 Other specified soft tissue disorders: Secondary | ICD-10-CM

## 2016-09-25 LAB — HEPARIN LEVEL (UNFRACTIONATED): Heparin Unfractionated: 0.7 [IU]/mL (ref 0.30–0.70)

## 2016-09-25 LAB — CBC
HCT: 29.7 % — ABNORMAL LOW (ref 36.0–46.0)
Hemoglobin: 9.4 g/dL — ABNORMAL LOW (ref 12.0–15.0)
MCH: 26.4 pg (ref 26.0–34.0)
MCHC: 31.6 g/dL (ref 30.0–36.0)
MCV: 83.4 fL (ref 78.0–100.0)
Platelets: 307 K/uL (ref 150–400)
RBC: 3.56 MIL/uL — ABNORMAL LOW (ref 3.87–5.11)
RDW: 16.9 % — ABNORMAL HIGH (ref 11.5–15.5)
WBC: 11 K/uL — ABNORMAL HIGH (ref 4.0–10.5)

## 2016-09-25 LAB — PROTIME-INR
INR: 1.68
Prothrombin Time: 20 s — ABNORMAL HIGH (ref 11.4–15.2)

## 2016-09-25 LAB — GLUCOSE, CAPILLARY
GLUCOSE-CAPILLARY: 104 mg/dL — AB (ref 65–99)
GLUCOSE-CAPILLARY: 122 mg/dL — AB (ref 65–99)
GLUCOSE-CAPILLARY: 122 mg/dL — AB (ref 65–99)
GLUCOSE-CAPILLARY: 107 mg/dL — AB (ref 65–99)

## 2016-09-25 MED ORDER — LEVETIRACETAM 500 MG/5ML IV SOLN
250.0000 mg | Freq: Two times a day (BID) | INTRAVENOUS | Status: DC
  Administered 2016-09-25 – 2016-10-05 (×21): 250 mg via INTRAVENOUS
  Filled 2016-09-25 (×25): qty 2.5

## 2016-09-25 MED ORDER — WARFARIN SODIUM 5 MG PO TABS: 5.0000 mg | ORAL_TABLET | Freq: Once | ORAL | Status: DC

## 2016-09-25 NOTE — Progress Notes (Addendum)
PROGRESS NOTE    Casey [name]  KAJ:[id number] DOB: 1940-04-20 DOA: 09/14/2016 PCP: Gildardo Cranker, DO   Brief Narrative: Casey [name] is a 76 y.o. female with a history of CVA, seizures, dementia, chronic Foley, recent repair of left femoral pseudoaneurysm. She presented with fever and body aches and found to have HCAP. She then developed respiratory failure and required intubation. Atrial fibrillation was complicated by RVR and required Cardizem drip. She also had acute on chronic systolic heart failure requiring aggressive diuresis. Patient extubated in transition to BiPAP which was eventually discontinued. She is an NG tube secondary to dysphagia with high aspiration risk. Patient is bedbound. Will need goals of care discussion with family and patient.   Assessment & Plan:   Active Problems:   PNA (pneumonia)   Pneumonia due to gram-positive bacteria   Pneumonia   Acute on chronic systolic CHF (congestive heart failure), NYHA class 4 (HCC)   DCM (dilated cardiomyopathy) (HCC)   Acute respiratory failure with hypoxia (HCC)   Dyspnea   HCAP (healthcare-associated pneumonia)   SOB (shortness of breath)   Encounter for palliative care   Goals of care, counseling/discussion   Atrial fibrillation with RVR Patient with a CHA2DS2-VASc Score is 8. Required diltiazem drip and was transitioned to oral metoprolol (per tube). Cardiology on board. INR of 1.63 -Cardiology recommendations -Continue coumadin per pharmacy with heparin bridge -Continue metoprolol  Acute on chronic systolic CHF EF of 03-55% with diffuse hypokinesis, PA peak pressure of 53 mmHg, severe right atrial enlargement and mild to moderate tricuspid regurg. Diuresed. Weight is down 18 pounds. Since admission, she is net +2 L per documented in/out's -Cardiology recommendations -Continue furosemide  Acute respiratory failure with hypoxia Secondary to HCAP and acute pulmonary edema from CHF exacerbation. Patient  required intubation from 8/17 to 8/22 and then was transitioned to BiPAP. Currently resolved.  HCAP Tracheal aspirate without any growth. Patient completed 8 day course of antibiotics.  CKD stage 3 Creatinine stable.  Severe protein calorie malnutrition Dysphagia Aspiration risk NG pulled out. Patient refusing a new one to be placed. -speech recommendations -Palliative care medicine consult for goals of care (meeting today)  Anemia of chronic disease Stable  Diabetes mellitus, type 2 -Discontinue Lantus now that she is off tube feeds -Continue sliding-scale insulin  Bed bound Patient from SNF. -goals of care as mentioned above.  Dementia -Continue Aricept and Zoloft  History of CVA -Continue aspirin and Lipitor (holding secondary to NPO and no feeding tube)  Seizure disorder -Continue Keppra  Goals of care Patient informed about family meeting. I think it would be good to clarify with her whether she would like to be involved. I tried to clarify and it seems she might not want to be involved secondary to family concerns.  Left arm pain Possible thrombosis vs hematoma. Not painful on my exam. -venous duplex -will consider CT scan of her arm if no thrombosis.   DVT prophylaxis: Heparin drip; Coumadin Code Status: Full code Family Communication: None at bedside Disposition Plan: Discharge pending goals of care discussions and Coumadin bridging   Consultants:   PCCM  Cardiology  Palliative care  Procedures:   ETT (8/17>>8/22)  Antimicrobials:  Zosyn (8/16)  Vancomycin (8/16>>8/19)  Cefepime (8/18>>8/23)    Subjective: Discussed some goals with patient. She states that she does not want a feeding tube, but when I discussed consequences of her not receiving nutrition, including death, she appeared to be concerned at that consequence. I tried to make  the connection and addressed her high aspiration risk that would likely lead to recurrent pneumonia  whether or not she decided to eat by mouth.   Objective: Vitals:   09/24/16 1308 09/24/16 2054 09/25/16 0450 09/25/16 0500  BP: 137/64 138/79 (!) 142/81   Pulse: 90  100   Resp: (!) _0 Temp: 98.7 F (37.1 C) 98.8 F (37.1 C) 98.5 F (36.9 C)   TempSrc: Oral Oral Oral   SpO2: 98% 97% 95%   Weight:    60.7 kg (133 lb 13.1 oz)  Height:        Intake/Output Summary (Last 24 hours) at 09/25/16 1317 Last data filed at 09/25/16 0600  Gross per 24 hour  Intake              248 ml  Output              800 ml  Net             -552 ml   Filed Weights   09/23/16 0455 09/24/16 0500 09/25/16 0500  Weight: 60 kg (132 lb 4.4 oz) 61.2 kg (134 lb 14.7 oz) 60.7 kg (133 lb 13.1 oz)    Examination:  General exam: Appears calm and comfortable. Chronically ill appearing. Respiratory system: Clear to auscultation bilaterally. Unlabored work of breathing. No wheezing or rales. Cardiovascular system: S1 & S2 heard, RRR. No murmurs. Gastrointestinal system: Soft, non-tender, non-distended, no guarding, no rebound, no masses felt Central nervous system: Alert and oriented to person only. Extremities: No calf tenderness. Left arm significant for swelling over bicep muscle that is not tender to the patient and not warm compared to the rest of her arm. IV placed near this location. Skin: No cyanosis. No rashes Psychiatry: Judgement and insight appear impaired. Mood & affect depressed and flat. Memory impaired.    Data Reviewed: I have personally reviewed following labs and imaging studies  CBC:  Recent Labs Lab 09/21/16 0447 09/22/16 0500 09/23/16 0435 09/24/16 0417 09/25/16 0507  WBC 12.9* 9.9 10.2 11.0* 11.0*  HGB 8.9* 8.7* 8.8* 9.0* 9.4*  HCT 27.9* 27.4* 27.3* 28.1* 29.7*  MCV 83.8 82.5 82.2 81.7 83.4  PLT 277 284 286 296 017   Basic Metabolic Panel:  Recent Labs Lab 09/19/16 0235 09/20/16 0252 09/21/16 0136 09/21/16 0447 09/21/16 0914 09/21/16 1528 09/21/16 1803  09/22/16 0500 09/23/16 0435  NA 145 143  --  139  --   --   --  142 140  K 4.1 4.4  --  3.8  --   --   --  3.5 3.2*  CL 117* 112*  --  104  --   --   --  104 100*  CO2 23 24  --  28  --   --   --  31 33*  GLUCOSE 149* 148*  --  237*  --   --   --  132* 202*  BUN 26* 29*  --  33*  --   --   --  37* 33*  CREATININE 1.34* 1.32*  --  1.21*  --   --   --  1.32* 1.25*  CALCIUM 9.4 9.7  --  9.9  --   --   --  9.9 9.6  MG 1.6* 2.0 4.4*  --  3.2* 2.8* 2.8* 2.4  --   PHOS 1.9*  --   --   --   --   --   --   --   --  GFR: Estimated Creatinine Clearance: 31.7 mL/min (A) (by C-G formula based on SCr of 1.25 mg/dL (H)). Liver Function Tests: No results for input(s): AST, ALT, ALKPHOS, BILITOT, PROT, ALBUMIN in the last 168 hours. No results for input(s): LIPASE, AMYLASE in the last 168 hours. No results for input(s): AMMONIA in the last 168 hours. Coagulation Profile:  Recent Labs Lab 09/21/16 0333 09/22/16 0500 09/23/16 0435 09/24/16 0417 09/25/16 0507  INR 1.39 1.41 1.63 1.57 1.68   Cardiac Enzymes: No results for input(s): CKTOTAL, CKMB, CKMBINDEX, TROPONINI in the last 168 hours. BNP (last 3 results) No results for input(s): PROBNP in the last 8760 hours. HbA1C: No results for input(s): HGBA1C in the last 72 hours. CBG:  Recent Labs Lab 09/24/16 1746 09/24/16 2124 09/24/16 2337 09/25/16 0447 09/25/16 1012  GLUCAP 121* 106* 136* 104* 122*   Lipid Profile: No results for input(s): CHOL, HDL, LDLCALC, TRIG, CHOLHDL, LDLDIRECT in the last 72 hours. Thyroid Function Tests: No results for input(s): TSH, T4TOTAL, FREET4, T3FREE, THYROIDAB in the last 72 hours. Anemia Panel: No results for input(s): VITAMINB12, FOLATE, FERRITIN, TIBC, IRON, RETICCTPCT in the last 72 hours. Sepsis Labs: No results for input(s): PROCALCITON, LATICACIDVEN in the last 168 hours.  Recent Results (from the past 240 hour(s))  Urine culture     Status: None   Collection Time: 09/16/16  6:39 AM    Result Value Ref Range Status   Specimen Description URINE, RANDOM  Final   Special Requests NONE  Final   Culture   Final    NO GROWTH Performed at [facility], [address], [place], [place] [address]    Report Status 09/17/2016 FINAL  Final         Radiology Studies: Dg Chest Port 1 View  Result Date: 09/24/2016 CLINICAL DATA:  Shortness breath, CHF EXAM: PORTABLE CHEST 1 VIEW COMPARISON:  09/22/2016 FINDINGS: Left PICC line remains in place, unchanged. Cardiomegaly with vascular congestion, improving since prior study. Improving aeration in the lung bases. No overt edema, effusions or confluent opacities. IMPRESSION: Cardiomegaly, vascular congestion. Vascular congestion has improved since prior study with improving aeration in the lung bases. Electronically Signed   By: Rolm Baptise M.D.   On: 09/24/2016 07:49        Scheduled Meds: . allopurinol  100 mg Per Tube Daily  . aspirin  81 mg Per Tube Daily  . atorvastatin  10 mg Per Tube QHS  . bacitracin  1 application Topical BID  . chlorhexidine gluconate (MEDLINE KIT)  15 mL Mouth Rinse BID  . donepezil  10 mg Per Tube QHS  . feeding supplement (JEVITY 1.2 CAL)  1,000 mL Per Tube Q24H  . feeding supplement (PRO-STAT SUGAR FREE 64)  30 mL Per Tube BID  . free water  100 mL Per Tube Q4H  . furosemide  40 mg Intravenous BID  . insulin aspart  0-15 Units Subcutaneous Q4H  . insulin glargine  10 Units Subcutaneous Daily  . lactobacillus  1 g Per Tube TID WC  . losartan  50 mg Per NG tube Daily  . mouth rinse  15 mL Mouth Rinse QID  . metoprolol tartrate  5 mg Intravenous Q6H  . multivitamin  15 mL Per Tube Daily  . pantoprazole sodium  40 mg Per Tube Q24H  . sertraline  50 mg Per Tube QHS  . sodium chloride flush  10-40 mL Intracatheter Q12H  . warfarin  5 mg Oral ONCE-1800  . warfarin  6 mg Oral ONCE-1800  . Warfarin - Pharmacist Dosing Inpatient   Does not apply q1800   Continuous Infusions: . heparin 850  Units/hr (09/25/16 1115)  . levETIRAcetam Stopped (09/25/16 1115)     LOS: 11 days     Cordelia Poche, MD [facility] 09/25/2016, 1:17 PM Pager: [phone]  If 7PM-7AM, please contact night-coverage [email or web address] Password [email or web address] 09/25/2016, 1:17 PM

## 2016-09-25 NOTE — Progress Notes (Signed)
ANTICOAGULATION CONSULT NOTE - Follow Up Consult  Pharmacy Consult for Heparin, Warfarin Indication: atrial fibrillation  Allergies  Allergen Reactions  . Penicillins Other (See Comments)    Told by a doctor to "not take" and on MAR as an allergy Has patient had a PCN reaction causing immediate rash, facial/tongue/throat swelling, SOB or lightheadedness with hypotension: Unknown Has patient had a PCN reaction causing severe rash involving mucus membranes or skin necrosis: Unknown Has patient had a PCN reaction that required hospitalization: Unknown Has patient had a PCN reaction occurring within the last 10 years: Unknown If all of the above answers are "NO", then may proceed with Ceph    Patient Measurements: Height: 5\' 3"  (160 cm) Weight: 133 lb 13.1 oz (60.7 kg) IBW/kg (Calculated) : 52.4 Heparin Dosing Weight: 64 kg  Vital Signs: Temp: 98.5 F (36.9 C) (08/28 0450) Temp Source: Oral (08/28 0450) BP: 142/81 (08/28 0450) Pulse Rate: 100 (08/28 0450)  Labs:  Recent Labs  09/23/16 0435  09/23/16 2211 09/24/16 0417 09/24/16 0420 09/25/16 0507  HGB 8.8*  --   --  9.0*  --  9.4*  HCT 27.3*  --   --  28.1*  --  29.7*  PLT 286  --   --  296  --  307  LABPROT 19.5*  --   --  19.0*  --  20.0*  INR 1.63  --   --  1.57  --  1.68  HEPARINUNFRC  --   < > 0.56  --  0.52 0.70  CREATININE 1.25*  --   --   --   --   --   < > = values in this interval not displayed.  Estimated Creatinine Clearance: 31.7 mL/min (A) (by C-G formula based on SCr of 1.25 mg/dL (H)).   Medications:  Infusions:  . heparin 850 Units/hr (09/25/16 1115)  . levETIRAcetam Stopped (09/25/16 1115)    Assessment: 76 yo female with hx of Afib (NO anticoagulation PTA), dementia, CVA, seizures, bedbound, recent repair of left femoral pseduo aneurysm complictaed with infection at the surgical site treated with doxycycline now admitted with subjective fever cough shortness of breath and generalized body aches.  On tele, found to be in Afib with RVR and started on cardizem drip which was stopped due to hypotension.  Pharmacy is consulted to dose IV heparin and warfarin for anticoagulation for afib.  Significant events:  Pt pulled out NGT and refuses to have replaced, MD made aware  Palliative meeting scheduled for 8/28 @ 1700, potential peg tube placement  09/25/2016 Heparin level= 0.7 on 850 units/hr INR 1.68 subtherapeutic, has not received warfarin since 8/23 H/H low but stable Plts WNL No bleeding per RN  Goal of Therapy:  INR 2-3 Heparin level 0.3-0.7 units/ml Monitor platelets by anticoagulation protocol: Yes   Plan:   Continue IV heparin drip at 850 units/hr  Daily heparin level, INR, and CBC  Warfarin 5mg  po x1 tonight, if pt will not be able to take po meds suggest switching to therapeutic lovenox  Patient will need a minimum of 5 days warfarin / Heparin overlap and until INR > 2 for 24 hours.   F/u GOC post palliative care meeting    Dolly Rias RPh 09/25/2016, 12:31 PM Pager [phone]

## 2016-09-25 NOTE — Progress Notes (Signed)
Verified with IV team RN PICC did not need to be NSL. Per IV team RN ok to keep IVF running at [facility] and give meds through PICC. MD aware of swelling and tenderness.

## 2016-09-25 NOTE — Progress Notes (Signed)
**  Preliminary report by tech**  Left upper extremity venous duplex complete. There is no obvious evidence of deep or superficial vein thrombosis involving the left upper extremity. All clearly visualized vessels appear patent and compressible.  09/25/16 2:22 PM Casey [name] RVT

## 2016-09-25 NOTE — Progress Notes (Signed)
Palliative care progress note   Reason for consult: Goals of care in light of dysphagia; Possible PEG tube  I met today with Casey [name] in conjunction with her daughter Casey [name] and son Casey [name].  I introduced palliative care as specialized medical care for people living with serious illness. It focuses on providing relief from the symptoms and stress of a serious illness. The goal is to improve quality of life for both the patient and the family.  We discussed her clinical course this hospitalization, including swallow studies and concern for aspiration.  She has been stating that she wants something to eat and does not want feeding tube.  We discussed this at length including the benefits and burdens of feeding tube in dysphagia related to advanced dementia.  Both her son and daughter agree that she would not want to pursue feeding tube if she were to understand situation.  Family understands risk for aspiration and would like me to discuss safest diet to restart with SLP prior to ordering diet.  We also discussed that the hospital can be useful as long as she is getting well enough from care she receives at the hospital to enjoy time outside of the hospital, but there is going to come a time in the near future where, if goal is to be out of the hospital, she may be better served to plan on being at home and bringing care to him at home rather repeated trips to the hospital.  We also discussed recent ICU admission including intubation.  Family reports that they would like to continue with FULL CODE for now but will continue to discuss.  We are planning f/u meeting tomorrow att 4PM.  Total time: 40 minutes Greater than 50%  of this time was spent counseling and coordinating care related to the above assessment and plan.  Micheline Rough, MD [place] Team [phone]

## 2016-09-25 NOTE — Progress Notes (Signed)
Palliative Medicine Team consult was received.   Family cannot meet until this afternoon.  We have set up a meeting for 1700 today.  If there are urgent needs or questions please call [phone]. Thank you for consulting out team to assist with this patients care.  Micheline Rough, MD [place] Team [phone]

## 2016-09-25 NOTE — Progress Notes (Signed)
Left upper arm PICC dressing changed. Noted left upper arm swollen and hurting per patient. Floor RN made aware and will notify MD.

## 2016-09-26 LAB — BASIC METABOLIC PANEL
ANION GAP: 16 — AB (ref 5–15)
BUN: 37 mg/dL — AB (ref 6–20)
CO2: 28 mmol/L (ref 22–32)
Calcium: 10.1 mg/dL (ref 8.9–10.3)
Chloride: 103 mmol/L (ref 101–111)
Creatinine, Ser: 1.86 mg/dL — ABNORMAL HIGH (ref 0.44–1.00)
GFR calc Af Amer: 29 mL/min — ABNORMAL LOW (ref >60)
GFR, EST NON AFRICAN AMERICAN: 25 mL/min — AB (ref >60)
Glucose, Bld: 126 mg/dL — ABNORMAL HIGH (ref 65–99)
POTASSIUM: 3.4 mmol/L — AB (ref 3.5–5.1)
SODIUM: 147 mmol/L — AB (ref 135–145)

## 2016-09-26 LAB — CBC
HCT: 30.4 % — ABNORMAL LOW (ref 36.0–46.0)
HEMOGLOBIN: 9.7 g/dL — AB (ref 12.0–15.0)
MCH: 26.4 pg (ref 26.0–34.0)
MCHC: 31.9 g/dL (ref 30.0–36.0)
MCV: 82.6 fL (ref 78.0–100.0)
Platelets: 333 10*3/uL (ref 150–400)
RBC: 3.68 MIL/uL — AB (ref 3.87–5.11)
RDW: 16.6 % — ABNORMAL HIGH (ref 11.5–15.5)
WBC: 12.1 10*3/uL — ABNORMAL HIGH (ref 4.0–10.5)

## 2016-09-26 LAB — PROTIME-INR
INR: 1.84
PROTHROMBIN TIME: 21.1 s — AB (ref 11.4–15.2)

## 2016-09-26 LAB — GLUCOSE, CAPILLARY
GLUCOSE-CAPILLARY: 131 mg/dL — AB (ref 65–99)
GLUCOSE-CAPILLARY: 135 mg/dL — AB (ref 65–99)
GLUCOSE-CAPILLARY: 131 mg/dL — AB (ref 65–99)
Glucose-Capillary: 132 mg/dL — ABNORMAL HIGH (ref 65–99)
Glucose-Capillary: 143 mg/dL — ABNORMAL HIGH (ref 65–99)
Glucose-Capillary: 155 mg/dL — ABNORMAL HIGH (ref 65–99)
Glucose-Capillary: 157 mg/dL — ABNORMAL HIGH (ref 65–99)

## 2016-09-26 LAB — HEPARIN LEVEL (UNFRACTIONATED): HEPARIN UNFRACTIONATED: 0.58 [IU]/mL (ref 0.30–0.70)

## 2016-09-26 MED ORDER — ONDANSETRON HCL 4 MG/2ML IJ SOLN
4.0000 mg | Freq: Four times a day (QID) | INTRAMUSCULAR | Status: DC | PRN
  Administered 2016-09-26 – 2016-09-28 (×2): 4 mg via INTRAVENOUS
  Filled 2016-09-26 (×2): qty 2

## 2016-09-26 MED ORDER — WARFARIN SODIUM 2.5 MG PO TABS: 2.5000 mg | ORAL_TABLET | Freq: Once | ORAL | Status: DC

## 2016-09-26 MED ORDER — POTASSIUM CHLORIDE 10 MEQ/100ML IV SOLN
10.0000 meq | INTRAVENOUS | Status: AC
  Administered 2016-09-26 (×3): 10 meq via INTRAVENOUS
  Filled 2016-09-26 (×3): qty 100

## 2016-09-26 NOTE — Care Management Note (Signed)
Case Management Note  Patient Details  Name: Casey [name] MRN: [id number] Date of Birth: May 16, 1940  Subjective/Objective:  Transfer from SDU. PNA,CHF, afib,dysphagia. Full code. Palliative care following-NO PEG,to further discuss GOC.From SNF.CSW/CM following.                  Action/Plan:d/c plan SNF.   Expected Discharge Date:   (UNKNOWN)               Expected Discharge Plan:  Skilled Nursing Facility  In-House Referral:  Clinical Social Work  Discharge planning Services  CM Consult  Post Acute Care Choice:    Choice offered to:     DME Arranged:    DME Agency:     HH Arranged:    [place] Agency:     Status of Service:  In process, will continue to follow  If discussed at Long Length of Stay Meetings, dates discussed:    Additional Comments:  Dessa Phi, RN 09/26/2016, 10:56 AM

## 2016-09-26 NOTE — Progress Notes (Addendum)
  Speech Language Pathology Treatment: Dysphagia  Patient Details Name: Casey [name] MRN: [id number] DOB: 1940/08/01 Today's Date: 09/26/2016 Time: 1201-1216 SLP Time Calculation (min) (ACUTE ONLY): 15 min  Assessment / Plan / Recommendation Clinical Impression  Spoke with Dr. Domingo Cocking this morning who reported family is willing to accept aspiration risks and desires to resume po's. Prior to seeing pt RN reported pt with frequent coughing on her oral secretions and is a full code. SLP repositioned pt to upright position, removed thick phlegm pt expectorated into oral cavity. Ice chip consumed to assist in thinning mucous lead to immediate significantly congested productive cough which was continuous and pt's heart rate rose to the 180's  Difficult situation in that pt family accepting risks however pt is a full code, RN concerned with feeding if she deteriorates and requires ICU/intubation. Per note, Palliative care is meeting with family today to discuss further. Perhaps keep NPO until further discussion with Palliative and family and possible consequences of comfort feeds with pt full code. Continue oral care, moisten oral cavity and assisting with oral suctioning as pt is blind and cannot use independently. Will follow up.   HPI HPI: Casey Waskey Kittrellis a 76 y.o.femalewith a past medical history significant for lymphoma, dementia, pAF not on anticoag, CVA with R hemiparesis, CHF EF 35%, HTN, PVD s/p fem-fem bypass, seizures and legally blindwho presents with AMS, Pt found to have pna and required intubation from 8/17-8/22.  Swallow evaluation ordered.       SLP Plan  Continue with current plan of care       Recommendations  Diet recommendations:  (sewe impression statement)                Oral Care Recommendations: Oral care QID Follow up Recommendations: None SLP Visit Diagnosis: Dysphagia, oropharyngeal phase (R13.12);Dysphagia, pharyngoesophageal phase (R13.14) Plan:  Continue with current plan of care       GO                Houston Siren 09/26/2016, 1:20 PM  Orbie Pyo Colvin Caroli[employer] [phone]

## 2016-09-26 NOTE — Progress Notes (Signed)
Palliative care progress note   Reason for consult: Goals of care in light of dysphagia; Possible PEG tube  I met today with Ms. Moffitt and her family son [name] in person and daughter Fraser Din via phone) in conjunction with Asencion Gowda, NP.  We discussed her clinical course over the last 24 hours, including plan for restarting feeds and results of working with SLP including concern today for immediate aspiration.  We discussed again the benefits and burdens of feeding tube in dysphagia related to advanced dementia, but also the difficult situation of continued nutrition.  We discussed how this also fits into larger scope of overall goals of care and how, if it is highly likely that she will aspirate immediately, it is difficult to allow feeding if plan remains for FULL CODE and continued aggressive care.  We discussed options of: 1) Changing code status to DNR and allowing feeds for comfort understanding that she is going to aspirate at some point moving forward. 2) Continuing plan for aggressive care.  This is a difficult situation as she will potentially aspirate immediately with PO intake.  PEG tube may stop this immediate aspiration of PO intake, but she will continue to aspirate secretions and refluxed tube feeds.  A PEG tube is therefore unlikely to create long term change in her outcome.  Family reports needing time to consider options.    We are planning f/u meeting tomorrow at 3:45PM.  Total time: 40 minutes Greater than 50%  of this time was spent counseling and coordinating care related to the above assessment and plan.  Micheline Rough, MD [place] Team [phone]

## 2016-09-26 NOTE — Progress Notes (Signed)
Patient ID: Casey [name], female   DOB: 01/16/1941, 76 y.o.   MRN: [id number]  PROGRESS NOTE    JAICEE MICHELOTTI  YVO:[id number] DOB: [birth year] DOA: 09/14/2016  PCP: Gildardo Cranker, DO   Brief Narrative:  76 year old female with history of CVA, seizures, dementia, chronic foley. Pt presented to ED with fevers and myalgias. Pt was found to have HCAP. She subsequently developed respiratory failure and required intubation. She also developed a fib and has required Cardizem drip. Additionally, she developed acute systolic CHF exacerbation requiring diureses. She has had NG tube for feeding but has removed it on her own. She remains high risk of aspiration. PCT following for goals of care.   Assessment & Plan:   Atrial fibrillation with RVR - CHA2DS2-VASc Score8 - Required Cardizem drip, now transitioned to metoprolol - Continue Heparin drip   Acute on chronic systolic CHF - EF 86-38% with diffuse hypokinesis - Continue lasix 40 mg IV BID  Essential hypertension - Continue metoprolol   HCAP / Acute respiratory failure with hypoxia - Tracheal aspirate without any growth - Pt has required intubation from 8/17 through 8/22 - Pt completed 8 day course of abx   CKD stage 3 - Cr 1.8  - Monitor daily BMP  Hypernatremia - Likely due to dehydration - Monitor BMP  Severe protein calorie malnutrition / Dysphagia / Aspiration risk - Pulled her own NG tube - Seen by nutritionist  - High risk of aspiration - Palliative care team following   Anemia of chronic disease - Hgb stable   Diabetes mellitus, type 2 with diabetic nephropathy - Continue SSI  Dementia - Stable  - No behavioral disturbance - PO meds on hold as pt high risk of aspiration - She removed her NG tube   History of CVA / Functional quadriplegia  - Pt has no NG tube  - Family did not want pt to have PEG tube - All PO meds on hold at this time   Seizure disorder - Continue IV Keppra   Goals of  care - Palliative care team following - Appreciate their assistance   Left arm pain - LE doppler showed no DVT - No reports of pain     DVT prophylaxis: Heparin drip Code Status: full code  Family Communication: no family at the bedside this am Disposition Plan: PCT following for goals of care, nutrition not yet addressed, pt at high risk of aspiration but no alternative modes of nutrition established yet   Consultants:   PCT  CCM  Cardio  GI - consultation pending depending on family decision but as of this time they did not want pt to have PEG   Procedures:   ETT 8/17 --> 8/22  LE doppler - no DVT  Antimicrobials:   Zosyn 8/16  Vanco 8/16  --> 8/19  Cefepime 8/18 --> 8/23   Subjective: No overnight events.  Objective: Vitals:   09/25/16 0500 09/25/16 1337 09/25/16 2043 09/26/16 0425  BP:  133/70 (!) 148/91 (!) 157/98  Pulse:  93 (!) 102 99  Resp:  _0 Temp:  (!) 97.5 F (36.4 C) 98.5 F (36.9 C) 98.4 F (36.9 C)  TempSrc:  Axillary Oral Oral  SpO2:  99% 96% 98%  Weight: 60.7 kg (133 lb 13.1 oz)   54.5 kg (120 lb 2.4 oz)  Height:        Intake/Output Summary (Last 24 hours) at 09/26/16 1247 Last data filed at 09/26/16 0543  Gross  per 24 hour  Intake           379.76 ml  Output             1500 ml  Net         -1120.24 ml   Filed Weights   09/24/16 0500 09/25/16 0500 09/26/16 0425  Weight: 61.2 kg (134 lb 14.7 oz) 60.7 kg (133 lb 13.1 oz) 54.5 kg (120 lb 2.4 oz)    Examination:  General exam: Appears calm and comfortable  Respiratory system: diminished breath sounds, coarse breath sounds  Cardiovascular system: S1 & S2 heard, RRR. Gastrointestinal system: Abdomen is nondistended, soft and nontender. No organomegaly or masses felt. Normal bowel sounds heard. Central nervous system: Non focal Extremities: Symmetric 5 x 5 power. Skin: No rashes, lesions or ulcers Psychiatry: Normal mood and behavior  Data Reviewed: I have  personally reviewed following labs and imaging studies  CBC:  Recent Labs Lab 09/22/16 0500 09/23/16 0435 09/24/16 0417 09/25/16 0507 09/26/16 0534  WBC 9.9 10.2 11.0* 11.0* 12.1*  HGB 8.7* 8.8* 9.0* 9.4* 9.7*  HCT 27.4* 27.3* 28.1* 29.7* 30.4*  MCV 82.5 82.2 81.7 83.4 82.6  PLT 284 286 296 307 622   Basic Metabolic Panel:  Recent Labs Lab 09/20/16 0252 09/21/16 0136 09/21/16 0447 09/21/16 0914 09/21/16 1528 09/21/16 1803 09/22/16 0500 09/23/16 0435 09/26/16 0534  NA 143  --  139  --   --   --  142 140 147*  K 4.4  --  3.8  --   --   --  3.5 3.2* 3.4*  CL 112*  --  104  --   --   --  104 100* 103  CO2 24  --  28  --   --   --  31 33* 28  GLUCOSE 148*  --  237*  --   --   --  132* 202* 126*  BUN 29*  --  33*  --   --   --  37* 33* 37*  CREATININE 1.32*  --  1.21*  --   --   --  1.32* 1.25* 1.86*  CALCIUM 9.7  --  9.9  --   --   --  9.9 9.6 10.1  MG 2.0 4.4*  --  3.2* 2.8* 2.8* 2.4  --   --    GFR: Estimated Creatinine Clearance: 21.3 mL/min (A) (by C-G formula based on SCr of 1.86 mg/dL (H)). Liver Function Tests: No results for input(s): AST, ALT, ALKPHOS, BILITOT, PROT, ALBUMIN in the last 168 hours. No results for input(s): LIPASE, AMYLASE in the last 168 hours. No results for input(s): AMMONIA in the last 168 hours. Coagulation Profile:  Recent Labs Lab 09/22/16 0500 09/23/16 0435 09/24/16 0417 09/25/16 0507 09/26/16 0534  INR 1.41 1.63 1.57 1.68 1.84   Cardiac Enzymes: No results for input(s): CKTOTAL, CKMB, CKMBINDEX, TROPONINI in the last 168 hours. BNP (last 3 results) No results for input(s): PROBNP in the last 8760 hours. HbA1C: No results for input(s): HGBA1C in the last 72 hours. CBG:  Recent Labs Lab 09/25/16 2038 09/26/16 0008 09/26/16 0424 09/26/16 0744 09/26/16 1202  GLUCAP 107* 132* 131* 135* 155*   Lipid Profile: No results for input(s): CHOL, HDL, LDLCALC, TRIG, CHOLHDL, LDLDIRECT in the last 72 hours. Thyroid Function  Tests: No results for input(s): TSH, T4TOTAL, FREET4, T3FREE, THYROIDAB in the last 72 hours. Anemia Panel: No results for input(s): VITAMINB12, FOLATE, FERRITIN, TIBC, IRON, RETICCTPCT in the  last 72 hours. Urine analysis:    Component Value Date/Time   COLORURINE YELLOW 08/29/2016 1922   APPEARANCEUR CLEAR 08/29/2016 1922   LABSPEC 1.012 08/29/2016 1922   PHURINE 7.0 08/29/2016 1922   GLUCOSEU NEGATIVE 08/29/2016 1922   HGBUR SMALL (A) 08/29/2016 [place] NEGATIVE 08/29/2016 [place] NEGATIVE 08/29/2016 1922   PROTEINUR 100 (A) 08/29/2016 1922   UROBILINOGEN 0.2 02/02/2013 2008   NITRITE NEGATIVE 08/29/2016 1922   LEUKOCYTESUR TRACE (A) 08/29/2016 1922   Sepsis Labs: _0 (procalcitonin:4,lacticidven:4)   )No results found for this or any previous visit (from the past 240 hour(s)).    Radiology Studies: Dg Chest Port 1 View Result Date: 09/24/2016 Cardiomegaly, vascular congestion. Vascular congestion has improved since prior study with improving aeration in the lung bases.   Dg Abd Portable 1v Result Date: 09/22/2016 Feeding catheter within the stomach.   Dg Swallowing Func-speech Pathology Result Date: 09/22/2016 DIET RECOMMENDATION 09/22/2016 SLP Diet Recommendations NPO   Scheduled Meds: . bacitracin  1 application Topical BID  . chlorhexidine gluconate (MEDLINE KIT)  15 mL Mouth Rinse BID  . furosemide  40 mg Intravenous BID  . insulin aspart  0-15 Units Subcutaneous Q4H  . mouth rinse  15 mL Mouth Rinse QID  . metoprolol tartrate  5 mg Intravenous Q6H  . pantoprazole sodium  40 mg Per Tube Q24H  . sodium chloride flush  10-40 mL Intracatheter Q12H  . Warfarin - Pharmacist Dosing Inpatient   Does not apply q1800   Continuous Infusions: . heparin 850 Units/hr (09/25/16 2240)  . levETIRAcetam Stopped (09/26/16 1001)     LOS: 12 days    Time spent: 25 minutes  Greater than 50% of the time spent on counseling and coordinating the  care.   Leisa Lenz, MD [facility] Pager [phone]  If 7PM-7AM, please contact night-coverage [email or web address] Password [facility] 09/26/2016, 12:47 PM

## 2016-09-26 NOTE — Progress Notes (Signed)
ANTICOAGULATION CONSULT NOTE - Follow Up Consult  Pharmacy Consult for Heparin, Warfarin Indication: atrial fibrillation  Allergies  Allergen Reactions  . Penicillins Other (See Comments)    Told by a doctor to "not take" and on MAR as an allergy Has patient had a PCN reaction causing immediate rash, facial/tongue/throat swelling, SOB or lightheadedness with hypotension: Unknown Has patient had a PCN reaction causing severe rash involving mucus membranes or skin necrosis: Unknown Has patient had a PCN reaction that required hospitalization: Unknown Has patient had a PCN reaction occurring within the last 10 years: Unknown If all of the above answers are "NO", then may proceed with Ceph    Patient Measurements: Height: 5\' 3"  (160 cm) Weight: 120 lb 2.4 oz (54.5 kg) IBW/kg (Calculated) : 52.4 Heparin Dosing Weight: 64 kg  Vital Signs: Temp: 98.4 F (36.9 C) (08/29 0425) Temp Source: Oral (08/29 0425) BP: 157/98 (08/29 0425) Pulse Rate: 99 (08/29 0425)  Labs:  Recent Labs  09/24/16 0417 09/24/16 0420 09/25/16 0507 09/26/16 0534  HGB 9.0*  --  9.4* 9.7*  HCT 28.1*  --  29.7* 30.4*  PLT 296  --  307 333  LABPROT 19.0*  --  20.0* 21.1*  INR 1.57  --  1.68 1.84  HEPARINUNFRC  --  0.52 0.70 0.58  CREATININE  --   --   --  1.86*    Estimated Creatinine Clearance: 21.3 mL/min (A) (by C-G formula based on SCr of 1.86 mg/dL (H)).   Medications:  Infusions:  . heparin 850 Units/hr (09/25/16 2240)  . levETIRAcetam Stopped (09/26/16 1001)    Assessment: 76 yo female with hx of Afib (NO anticoagulation PTA), dementia, CVA, seizures, bedbound, recent repair of left femoral pseduo aneurysm complictaed with infection at the surgical site treated with doxycycline now admitted with subjective fever cough shortness of breath and generalized body aches. On tele, found to be in Afib with RVR and started on cardizem drip which was stopped due to hypotension.  Pharmacy is consulted to  dose IV heparin and warfarin for anticoagulation for afib.  Significant events:  8/27 Pt pulled out NGT and refuses to have replaced  Palliative meeting scheduled for 8/28 @ 1700, potential peg tube placement  8/29 No NGT or peg tube per family, attempted swallow study again and failed  09/26/2016 Heparin level= 0.58 on 850 units/hr INR 1.84 subtherapeutic,continues to increase despite not receiving a dose of warfarin since 8/23 H/H low but stable Plts WNL No bleeding per RN  Goal of Therapy:  INR 2-3 Heparin level 0.3-0.7 units/ml Monitor platelets by anticoagulation protocol: Yes   Plan:   Continue IV heparin drip at 850 units/hr  Daily heparin level, INR, and CBC  Suggest Warfarin 2.5mg  po x 1, if pt will not be able to take po meds suggest switching to therapeutic lovenox  Patient will need a minimum of 5 days warfarin / Heparin overlap and until INR > 2 for 24 hours.   F/u GOC and ability to take po anticoagulation.    Dolly Rias RPh 09/26/2016, 12:30 PM Pager [phone]

## 2016-09-26 NOTE — Progress Notes (Signed)
CSW continuing to follow to assist with discharge planning when patient is medically stable. CSW provided update to patient's SNF.   Abundio Miu, [place] Social Worker [facility] Cell#: [phone]

## 2016-09-27 LAB — GLUCOSE, CAPILLARY
GLUCOSE-CAPILLARY: 140 mg/dL — AB (ref 65–99)
GLUCOSE-CAPILLARY: 138 mg/dL — AB (ref 65–99)
Glucose-Capillary: 123 mg/dL — ABNORMAL HIGH (ref 65–99)
Glucose-Capillary: 114 mg/dL — ABNORMAL HIGH (ref 65–99)
Glucose-Capillary: 123 mg/dL — ABNORMAL HIGH (ref 65–99)

## 2016-09-27 LAB — CBC
HCT: 29.2 % — ABNORMAL LOW (ref 36.0–46.0)
HEMOGLOBIN: 8.9 g/dL — AB (ref 12.0–15.0)
MCH: 25.9 pg — AB (ref 26.0–34.0)
MCHC: 30.5 g/dL (ref 30.0–36.0)
MCV: 85.1 fL (ref 78.0–100.0)
PLATELETS: 311 10*3/uL (ref 150–400)
RBC: 3.43 MIL/uL — ABNORMAL LOW (ref 3.87–5.11)
RDW: 17.3 % — ABNORMAL HIGH (ref 11.5–15.5)
WBC: 14.3 10*3/uL — ABNORMAL HIGH (ref 4.0–10.5)

## 2016-09-27 LAB — PROTIME-INR
INR: 2.45
PROTHROMBIN TIME: 26.3 s — AB (ref 11.4–15.2)

## 2016-09-27 LAB — BASIC METABOLIC PANEL
Anion gap: 10 (ref 5–15)
BUN: 43 mg/dL — AB (ref 6–20)
CALCIUM: 9.9 mg/dL (ref 8.9–10.3)
CO2: 30 mmol/L (ref 22–32)
Chloride: 110 mmol/L (ref 101–111)
Creatinine, Ser: 2.11 mg/dL — ABNORMAL HIGH (ref 0.44–1.00)
GFR calc Af Amer: 25 mL/min — ABNORMAL LOW (ref >60)
GFR, EST NON AFRICAN AMERICAN: 22 mL/min — AB (ref >60)
GLUCOSE: 144 mg/dL — AB (ref 65–99)
POTASSIUM: 3.9 mmol/L (ref 3.5–5.1)
SODIUM: 150 mmol/L — AB (ref 135–145)

## 2016-09-27 LAB — MAGNESIUM: Magnesium: 1.9 mg/dL (ref 1.7–2.4)

## 2016-09-27 LAB — HEPARIN LEVEL (UNFRACTIONATED): HEPARIN UNFRACTIONATED: 0.78 [IU]/mL — AB (ref 0.30–0.70)

## 2016-09-27 MED ORDER — SODIUM CHLORIDE 0.9 % IV BOLUS (SEPSIS)
500.0000 mL | Freq: Once | INTRAVENOUS | Status: AC
  Administered 2016-09-27: 500 mL via INTRAVENOUS

## 2016-09-27 MED ORDER — SODIUM CHLORIDE 0.9 % IV BOLUS (SEPSIS)
250.0000 mL | Freq: Once | INTRAVENOUS | Status: AC
  Administered 2016-09-27: 250 mL via INTRAVENOUS

## 2016-09-27 MED ORDER — DILTIAZEM HCL 25 MG/5ML IV SOLN
10.0000 mg | Freq: Once | INTRAVENOUS | Status: AC
  Administered 2016-09-27: 10 mg via INTRAVENOUS
  Filled 2016-09-27: qty 5

## 2016-09-27 MED ORDER — HEPARIN (PORCINE) IN NACL 100-0.45 UNIT/ML-% IJ SOLN: 750.0000 [IU]/h | INTRAMUSCULAR | Status: DC

## 2016-09-27 MED ORDER — FUROSEMIDE 10 MG/ML IJ SOLN
40.0000 mg | Freq: Every day | INTRAMUSCULAR | Status: DC
  Administered 2016-09-28: 40 mg via INTRAVENOUS
  Filled 2016-09-27: qty 4

## 2016-09-27 MED ORDER — ENOXAPARIN SODIUM 60 MG/0.6ML ~~LOC~~ SOLN
1.0000 mg/kg | SUBCUTANEOUS | Status: DC
  Administered 2016-09-27 – 2016-09-28 (×2): 55 mg via SUBCUTANEOUS
  Filled 2016-09-27 (×2): qty 0.6

## 2016-09-27 MED ORDER — DILTIAZEM HCL 100 MG IV SOLR
5.0000 mg/h | INTRAVENOUS | Status: DC
  Administered 2016-09-27: 5 mg/h via INTRAVENOUS
  Administered 2016-09-27 – 2016-09-29 (×3): 7.5 mg/h via INTRAVENOUS
  Administered 2016-09-29 – 2016-09-30 (×3): 12.5 mg/h via INTRAVENOUS
  Administered 2016-09-30: 8 mg/h via INTRAVENOUS
  Administered 2016-10-01: 5 mg/h via INTRAVENOUS
  Administered 2016-10-02: 3 mg/h via INTRAVENOUS
  Filled 2016-09-27 (×14): qty 100

## 2016-09-27 MED ORDER — METOPROLOL TARTRATE 5 MG/5ML IV SOLN
5.0000 mg | Freq: Once | INTRAVENOUS | Status: AC
  Administered 2016-09-27: 5 mg via INTRAVENOUS
  Filled 2016-09-27: qty 5

## 2016-09-27 NOTE — Significant Event (Signed)
Rapid Response Event Note  Overview: Time Called: 0130 Arrival Time: 0135 Event Type: Cardiac  Initial Focused Assessment: 76 yo female pt, a/o per normal.  Staff called ref tachycardia and hypotension.  Pt HR fluctuating 100-160's afib, RN had already notified MD and received orders for lopressor and 500 cc bolus, orders being carried out.  Pt remained asymptomatic, subsequent BP 113/61, HR 120.     Interventions: supportive  Plan of Care (if not transferred):  Recheck BP and HR after bolus finished and to follow up with MD or RRN as needed.    Event Summary:   at      at          [facility]

## 2016-09-27 NOTE — Progress Notes (Signed)
Palliative care progress note   Reason for consult: Goals of care in light of dysphagia; Possible PEG tube  I saw and examined Ms. Ikard.  She remains unable to meaningfully participate in goals of care conversations. I called and discussed with her family (son and daughter) via phone.  We discussed her clinical course over the last few days, including reassessment by speech today and concerns of aspiration of secretions.  We discussed again the benefits and burdens of feeding tube in dysphagia related to advanced dementia and how this is not supported by evidence to prolong life in advanced dementia.  We also discussed that it would not change the fact that she is going to have aspiration of secretions (and likely of tube feeds if PEG is placed) nor would it correct her other chronic medical problems that continue to progress.    Her children have discussed this and have come to a decision that, despite the fact it will not prevent her from aspirating, they would like to continue with aggressive care, FULL CODE status and placement of PEG tube.   I shared concern that a PEG tube is unlikely to create long term change in her outcome while potentially compromising her quality of life.  My recommendation, therefore, was that since family desires PEG tube placement, they plan on doing this for a time limited trial and reassessing if she is benefitting from it in the way that they had hoped in the near future.  I text paged attending to make them aware of family desire for evaluation for PEG.  Total time: 40 minutes Greater than 50%  of this time was spent counseling and coordinating care related to the above assessment and plan.  Micheline Rough, MD [place] Team [phone]

## 2016-09-27 NOTE — Care Management Important Message (Signed)
Important Message  Patient Details  Name: NAKIRA LITZAU MRN: [id number] Date of Birth: [birth year]   Medicare Important Message Given:  Yes    Kerin Salen 09/27/2016, 10:20 AMImportant Message  Patient Details  Name: KEIYANA STEHR MRN: [id number] Date of Birth: September 25, 1940   Medicare Important Message Given:  Yes    Kerin Salen 09/27/2016, 10:20 AM

## 2016-09-27 NOTE — Progress Notes (Addendum)
  Speech Language Pathology Treatment: Dysphagia  Patient Details Name: Casey [name] MRN: [id number] DOB: May 24, 1940 Today's Date: 09/27/2016 Time: 4098-1191 SLP Time Calculation (min) (ACUTE ONLY): 31 min  Assessment / Plan / Recommendation Clinical Impression  Pt removed her small bore feeding tube herself prior to SLP session per notes. She was sitting upright in bed and willing to allow SLP to provide oral care. Copious amount of secretions - viscous and mostly clear - noted in oral cavity. Using oral suction, SLP able to remove large amount of them - especially having pt help with hand over hand use of suction.  RN reports pt has had copious secretions.    Pt was observed to start hiccuping with suctioning followed by wet vocal quality - requiring cues to cough and "hock" to propel large bolus of yellow tinged viscous secretions removed with oral suction.  Mild appearance of breathing difficulty noted which resolved with cough/hock.  Pt not given po intake due to overt concerns for aspiration of even secretions.   Granddaughter Casey [name] and great granddaughter arrived during session and reported pt to have frequent belching with intake "for years". Per Casey [name], pt also has been coughing with intake over the last few months *Casey [name] reports she eats with grandma approximately once every 2 weeks.  Acute on chronic dysphagia suspected that has exacerbated to gross dysphagia.  Suspect intake may not be comfortable for pt with level of dysphagia.  Demonstrated to Casey [name] use of oral suction and instructed her to have pt cough/hock if voice gurgly to help clear secretions.    Pt is aspirating secretions at this time and lack of feeding tube in pharynx did not appear to improve function unfortunately.   She will aspirate secretions and there is an increased concern for esophageal deficits impacting airway protection with long term tube feeding unfortunately.   Note per MOST form signed previously  located in hard chart- NO FEEDING tube was desired at that time. Difficult situation for this unfortunate patient.  Thankful palliative is following.    HPI HPI: Casey Warrior Kittrellis a 76 y.o.femalewith a past medical history significant for lymphoma, dementia, pAF not on anticoag, CVA with R hemiparesis, CHF EF 35%, HTN, PVD s/p fem-fem bypass, seizures and legally blindwho presents with AMS, Pt found to have pna and required intubation from 8/17-8/22.  Swallow evaluation ordered.       SLP Plan  Continue with current plan of care       Recommendations  Diet recommendations: NPO Medication Administration: Via alternative means                Oral Care Recommendations: Oral care QID Follow up Recommendations: None SLP Visit Diagnosis: Dysphagia, oropharyngeal phase (R13.12);Dysphagia, pharyngoesophageal phase (R13.14) Plan: Continue with current plan of care       [place], [place] [facility] SLP [phone]  Casey [name] 09/27/2016, 12:06 PM

## 2016-09-27 NOTE — Progress Notes (Addendum)
ANTICOAGULATION CONSULT NOTE - Follow Up Consult  Pharmacy Consult for Enoxaparin, Warfarin Indication: atrial fibrillation  Allergies  Allergen Reactions  . Penicillins Other (See Comments)    Told by a doctor to "not take" and on MAR as an allergy Has patient had a PCN reaction causing immediate rash, facial/tongue/throat swelling, SOB or lightheadedness with hypotension: Unknown Has patient had a PCN reaction causing severe rash involving mucus membranes or skin necrosis: Unknown Has patient had a PCN reaction that required hospitalization: Unknown Has patient had a PCN reaction occurring within the last 10 years: Unknown If all of the above answers are "NO", then may proceed with Ceph    Patient Measurements: Height: 5\' 3"  (160 cm) Weight: 122 lb 2.2 oz (55.4 kg) IBW/kg (Calculated) : 52.4 Heparin Dosing Weight: 64 kg  Vital Signs: Temp: 98.1 F (36.7 C) (08/30 0529) Temp Source: Oral (08/30 0529) BP: 116/57 (08/30 0558) Pulse Rate: 133 (08/30 0558)  Labs:  Recent Labs  09/25/16 0507 09/26/16 0534 09/27/16 0343  HGB 9.4* 9.7* 8.9*  HCT 29.7* 30.4* 29.2*  PLT 307 333 311  LABPROT 20.0* 21.1* 26.3*  INR 1.68 1.84 2.45  HEPARINUNFRC 0.70 0.58 0.78*  CREATININE  --  1.86* 2.11*    Estimated Creatinine Clearance: 18.8 mL/min (A) (by C-G formula based on SCr of 2.11 mg/dL (H)).   Medications:  Infusions:  . diltiazem (CARDIZEM) infusion 7.5 mg/hr (09/27/16 0559)  . heparin 850 Units/hr (09/26/16 2158)  . levETIRAcetam Stopped (09/26/16 2213)  . sodium chloride 250 mL (09/27/16 0610)    Assessment: 76 yo female with hx of Afib (NO anticoagulation PTA), dementia, CVA, seizures, bedbound, recent repair of left femoral pseduo aneurysm complictaed with infection at the surgical site treated with doxycycline now admitted with subjective fever cough shortness of breath and generalized body aches. On tele, found to be in Afib with RVR and started on cardizem drip which  was stopped due to hypotension.  Pharmacy is consulted to dose IV heparin and warfarin for anticoagulation for afib.  Significant events:  8/27 Pt pulled out NGT and refuses to have replaced.  Palliative meeting for discussion of potential peg tube placement  8/29 No NGT or peg tube per family, attempted swallow study again and failed  8/30 Follow up family meeting regarding PEG tube placement and GOC  09/27/2016  Heparin level= 0.78, increased to supratherapeutic on 850 units/hr  INR 2.45, large increase overnight to therapeutic range despite not receiving a dose of warfarin since 8/23.  CBC:  H/H low but stable.  Plts WNL.  No bleeding per RN  Diet: NPO d/t immediate aspiration   Goal of Therapy:  INR 2-3 Heparin level 0.3-0.7 units/ml Monitor platelets by anticoagulation protocol: Yes   Plan:   Decrease to IV heparin drip at 750 units/hr  Recheck HL in 8 hours  Daily heparin level, INR, and CBC  Hold warfarin doses (for large INR increase and no PO access)  If pt will not be able to take po meds suggest switching to therapeutic lovenox  F/u GOC and ability to take po anticoagulation.   Gretta Arab PharmD, BCPS Pager [phone] 09/27/2016 7:10 AM   Addedum: Change from Heparin to Enoxaparin CrCl ~ 18 ml/min Plan:  D/C heparin infusion  Start Enoxaparin 1 mg/kg, 55 mg, SQ Q24h  Follow up renal fxn and CBC.  Gretta Arab PharmD, BCPS Pager [phone] 09/27/2016 11:01 AM

## 2016-09-27 NOTE — Progress Notes (Signed)
Patient ID: Casey [name], female   DOB: May 12, 1940, 76 y.o.   MRN: [id number]  PROGRESS NOTE    DARYA BIGLER  GUR:[id number] DOB: 04/09/1940 DOA: 09/14/2016  PCP: Gildardo Cranker, DO   Brief Narrative:  76 year old female with history of CVA, seizures, dementia, chronic foley. Pt presented to ED with fevers and myalgias. Pt was found to have HCAP. She subsequently developed respiratory failure and required intubation. She also developed a fib and has required Cardizem drip. Additionally, she developed acute systolic CHF exacerbation requiring diureses. She has had NG tube for feeding but has removed it on her own. She remains high risk of aspiration. PCT following for goals of care.   Assessment & Plan:   Atrial fibrillation with RVR - CHA2DS2-VASc Score8 - Required Cardizem drip, now transitioned to metoprolol - Pt on heparin drip - Monitor for bleeding   Acute on chronic systolic CHF - EF 28-31% with diffuse hypokinesis - Reduce lasix to once a day regimen due to worsening Cr   Essential hypertension - Continue metoprolol   HCAP / Acute respiratory failure with hypoxia - Tracheal aspirate without any growth - Pt has required intubation from 8/17 through 8/22 - Pt completed 8 days of abx   CKD stage 3 - Cr continues to trend up likely due to lasix - Will hold lasix - Follow up BMP in am  Hypernatremia - Due to dehydration, poor po intake  - Check daily BMP - Palliative following for goals of care   Severe protein calorie malnutrition / Dysphagia / Aspiration risk - Pulled her own NG tube - High risk of aspiration - Seen by SLP - Palliative following for goals of care   Anemia of chronic disease - Hgb stable   Diabetes mellitus, type 2 with diabetic nephropathy - Continue SSI  Dementia without behavioral disturbance  - Stable   History of CVA / Functional quadriplegia  - Pt has no NG tube  - Family did not want pt to have PEG tube - PO meds on  hold   Seizure disorder - Continue Keppra   Left arm pain - LE doppler showed no DVT - No reports of pain     DVT prophylaxis: Heparin drip Code Status: full code  Family Communication: no family at the bedside Disposition Plan: not yet stable for discharge    Consultants:   PCT  CCM  Cardio  GI - consultation pending depending on family decision but as of this time they did not want pt to have PEG   Procedures:   ETT 8/17 --> 8/22  LE doppler - no DVT  Antimicrobials:   Zosyn 8/16  Vanco 8/16  --> 8/19  Cefepime 8/18 --> 8/23   Subjective: No overnight events.  Objective: Vitals:   09/27/16 0401 09/27/16 0438 09/27/16 0529 09/27/16 0558  BP: 117/78 (!) 111/47 (!) 128/55 (!) 116/57  Pulse: (!) 122 (!) 105 (!) 123 (!) 133  Resp: 20  20   Temp: 98.2 F (36.8 C)  98.1 F (36.7 C)   TempSrc: Oral  Oral   SpO2: 95%  92%   Weight: 55.4 kg (122 lb 2.2 oz)     Height:        Intake/Output Summary (Last 24 hours) at 09/27/16 1010 Last data filed at 09/27/16 0950  Gross per 24 hour  Intake          1263.71 ml  Output  400 ml  Net           863.71 ml   Filed Weights   09/25/16 0500 09/26/16 0425 09/27/16 0401  Weight: 60.7 kg (133 lb 13.1 oz) 54.5 kg (120 lb 2.4 oz) 55.4 kg (122 lb 2.2 oz)    Physical Exam  Constitutional: Appears ill, no distress  CVS: RRR, S1/S2 + Pulmonary: coarse breath sounds, no wheezing  Abdominal: Soft. BS +,  no distension Musculoskeletal: Normal range of motion. No tenderness.  Neuro: no focal deficit Skin: Skin is warm and dry.  Psychiatric: Normal behavior, not restless     Data Reviewed: I have personally reviewed following labs and imaging studies  CBC:  Recent Labs Lab 09/23/16 0435 09/24/16 0417 09/25/16 0507 09/26/16 0534 09/27/16 0343  WBC 10.2 11.0* 11.0* 12.1* 14.3*  HGB 8.8* 9.0* 9.4* 9.7* 8.9*  HCT 27.3* 28.1* 29.7* 30.4* 29.2*  MCV 82.2 81.7 83.4 82.6 85.1  PLT 286 296 307 333  600   Basic Metabolic Panel:  Recent Labs Lab 09/21/16 0447 09/21/16 0914 09/21/16 1528 09/21/16 1803 09/22/16 0500 09/23/16 0435 09/26/16 0534 09/27/16 0343  NA 139  --   --   --  142 140 147* 150*  K 3.8  --   --   --  3.5 3.2* 3.4* 3.9  CL 104  --   --   --  104 100* 103 110  CO2 28  --   --   --  31 33* 28 30  GLUCOSE 237*  --   --   --  132* 202* 126* 144*  BUN 33*  --   --   --  37* 33* 37* 43*  CREATININE 1.21*  --   --   --  1.32* 1.25* 1.86* 2.11*  CALCIUM 9.9  --   --   --  9.9 9.6 10.1 9.9  MG  --  3.2* 2.8* 2.8* 2.4  --   --  1.9   GFR: Estimated Creatinine Clearance: 18.8 mL/min (A) (by C-G formula based on SCr of 2.11 mg/dL (H)). Liver Function Tests: No results for input(s): AST, ALT, ALKPHOS, BILITOT, PROT, ALBUMIN in the last 168 hours. No results for input(s): LIPASE, AMYLASE in the last 168 hours. No results for input(s): AMMONIA in the last 168 hours. Coagulation Profile:  Recent Labs Lab 09/23/16 0435 09/24/16 0417 09/25/16 0507 09/26/16 0534 09/27/16 0343  INR 1.63 1.57 1.68 1.84 2.45   Cardiac Enzymes: No results for input(s): CKTOTAL, CKMB, CKMBINDEX, TROPONINI in the last 168 hours. BNP (last 3 results) No results for input(s): PROBNP in the last 8760 hours. HbA1C: No results for input(s): HGBA1C in the last 72 hours. CBG:  Recent Labs Lab 09/26/16 1651 09/26/16 2113 09/26/16 2325 09/27/16 0419 09/27/16 0752  GLUCAP 131* 157* 143* 140* 123*   Lipid Profile: No results for input(s): CHOL, HDL, LDLCALC, TRIG, CHOLHDL, LDLDIRECT in the last 72 hours. Thyroid Function Tests: No results for input(s): TSH, T4TOTAL, FREET4, T3FREE, THYROIDAB in the last 72 hours. Anemia Panel: No results for input(s): VITAMINB12, FOLATE, FERRITIN, TIBC, IRON, RETICCTPCT in the last 72 hours. Urine analysis:    Component Value Date/Time   COLORURINE YELLOW 08/29/2016 1922   APPEARANCEUR CLEAR 08/29/2016 1922   LABSPEC 1.012 08/29/2016 1922    PHURINE 7.0 08/29/2016 1922   GLUCOSEU NEGATIVE 08/29/2016 1922   HGBUR SMALL (A) 08/29/2016 1922   BILIRUBINUR NEGATIVE 08/29/2016 1922   KETONESUR NEGATIVE 08/29/2016 1922   PROTEINUR 100 (A) 08/29/2016  1922   UROBILINOGEN 0.2 02/02/2013 2008   NITRITE NEGATIVE 08/29/2016 1922   LEUKOCYTESUR TRACE (A) 08/29/2016 1922   Sepsis Labs: '@[email or web address]' (procalcitonin:4,lacticidven:4)   )No results found for this or any previous visit (from the past 240 hour(s)).    Radiology Studies: Dg Chest Port 1 View Result Date: 09/24/2016 Cardiomegaly, vascular congestion. Vascular congestion has improved since prior study with improving aeration in the lung bases.   Dg Abd Portable 1v Result Date: 09/22/2016 Feeding catheter within the stomach.   Dg Swallowing Func-speech Pathology Result Date: 09/22/2016 DIET RECOMMENDATION 09/22/2016 SLP Diet Recommendations NPO   Scheduled Meds: . bacitracin  1 application Topical BID  . chlorhexidine gluconate (MEDLINE KIT)  15 mL Mouth Rinse BID  . furosemide  40 mg Intravenous BID  . insulin aspart  0-15 Units Subcutaneous Q4H  . mouth rinse  15 mL Mouth Rinse QID  . metoprolol tartrate  5 mg Intravenous Q6H  . pantoprazole sodium  40 mg Per Tube Q24H  . sodium chloride flush  10-40 mL Intracatheter Q12H  . Warfarin - Pharmacist Dosing Inpatient   Does not apply q1800   Continuous Infusions: . diltiazem (CARDIZEM) infusion 7.5 mg/hr (09/27/16 0559)  . heparin 750 Units/hr (09/27/16 1000)  . levETIRAcetam Stopped (09/26/16 2213)     LOS: 13 days    Time spent: 25 minutes  Greater than 50% of the time spent on counseling and coordinating the care.   Leisa Lenz, MD [facility] Pager [phone]  If 7PM-7AM, please contact night-coverage [email or web address] Password [email or web address] 09/27/2016, 10:10 AM

## 2016-09-28 ENCOUNTER — Encounter (HOSPITAL_COMMUNITY): Payer: Self-pay | Admitting: General Surgery

## 2016-09-28 LAB — CBC
HCT: 27.6 % — ABNORMAL LOW (ref 36.0–46.0)
Hemoglobin: 8.3 g/dL — ABNORMAL LOW (ref 12.0–15.0)
MCH: 25.7 pg — ABNORMAL LOW (ref 26.0–34.0)
MCHC: 30.1 g/dL (ref 30.0–36.0)
MCV: 85.4 fL (ref 78.0–100.0)
Platelets: 275 10*3/uL (ref 150–400)
RBC: 3.23 MIL/uL — ABNORMAL LOW (ref 3.87–5.11)
RDW: 17 % — AB (ref 11.5–15.5)
WBC: 13.7 10*3/uL — ABNORMAL HIGH (ref 4.0–10.5)

## 2016-09-28 LAB — GLUCOSE, CAPILLARY
GLUCOSE-CAPILLARY: 147 mg/dL — AB (ref 65–99)
GLUCOSE-CAPILLARY: 122 mg/dL — AB (ref 65–99)
GLUCOSE-CAPILLARY: 101 mg/dL — AB (ref 65–99)
GLUCOSE-CAPILLARY: 131 mg/dL — AB (ref 65–99)
Glucose-Capillary: 117 mg/dL — ABNORMAL HIGH (ref 65–99)
Glucose-Capillary: 127 mg/dL — ABNORMAL HIGH (ref 65–99)

## 2016-09-28 LAB — BASIC METABOLIC PANEL
Anion gap: 8 (ref 5–15)
BUN: 47 mg/dL — ABNORMAL HIGH (ref 6–20)
CALCIUM: 9.9 mg/dL (ref 8.9–10.3)
CO2: 31 mmol/L (ref 22–32)
CREATININE: 2.13 mg/dL — AB (ref 0.44–1.00)
Chloride: 113 mmol/L — ABNORMAL HIGH (ref 101–111)
GFR calc non Af Amer: 21 mL/min — ABNORMAL LOW (ref >60)
GFR, EST AFRICAN AMERICAN: 25 mL/min — AB (ref >60)
Glucose, Bld: 125 mg/dL — ABNORMAL HIGH (ref 65–99)
Potassium: 4 mmol/L (ref 3.5–5.1)
SODIUM: 152 mmol/L — AB (ref 135–145)

## 2016-09-28 LAB — PROTIME-INR
INR: 2.57
PROTHROMBIN TIME: 27.4 s — AB (ref 11.4–15.2)

## 2016-09-28 MED ORDER — MORPHINE SULFATE (PF) 2 MG/ML IV SOLN
1.0000 mg | INTRAVENOUS | Status: DC | PRN
  Administered 2016-09-28 – 2016-10-05 (×13): 1 mg via INTRAVENOUS
  Filled 2016-09-28 (×13): qty 1

## 2016-09-28 MED ORDER — VANCOMYCIN HCL IN DEXTROSE 1-5 GM/200ML-% IV SOLN
1000.0000 mg | INTRAVENOUS | Status: AC
  Administered 2016-10-02: 1000 mg via INTRAVENOUS

## 2016-09-28 MED ORDER — ENOXAPARIN SODIUM 60 MG/0.6ML ~~LOC~~ SOLN
1.0000 mg/kg | SUBCUTANEOUS | Status: DC
  Administered 2016-09-29: 12:00:00 55 mg via SUBCUTANEOUS
  Filled 2016-09-28: qty 0.6

## 2016-09-28 NOTE — Progress Notes (Signed)
ANTICOAGULATION CONSULT NOTE - Follow Up Consult  Pharmacy Consult for Enoxaparin, Warfarin Indication: atrial fibrillation  Allergies  Allergen Reactions  . Penicillins Other (See Comments)    Told by a doctor to "not take" and on MAR as an allergy Has patient had a PCN reaction causing immediate rash, facial/tongue/throat swelling, SOB or lightheadedness with hypotension: Unknown Has patient had a PCN reaction causing severe rash involving mucus membranes or skin necrosis: Unknown Has patient had a PCN reaction that required hospitalization: Unknown Has patient had a PCN reaction occurring within the last 10 years: Unknown If all of the above answers are "NO", then may proceed with Ceph    Patient Measurements: Height: 5\' 3"  (160 cm) Weight: 131 lb 9.8 oz (59.7 kg) IBW/kg (Calculated) : 52.4 Heparin Dosing Weight: 64 kg  Vital Signs: Temp: 98.8 F (37.1 C) (08/31 0446) Temp Source: Oral (08/31 0446) BP: 127/66 (08/31 0446) Pulse Rate: 96 (08/31 0446)  Labs:  Recent Labs  09/26/16 0534 09/27/16 0343 09/28/16 0459  HGB 9.7* 8.9* 8.3*  HCT 30.4* 29.2* 27.6*  PLT 333 311 275  LABPROT 21.1* 26.3* 27.4*  INR 1.84 2.45 2.57  HEPARINUNFRC 0.58 0.78*  --   CREATININE 1.86* 2.11* 2.13*    Estimated Creatinine Clearance: 18.6 mL/min (A) (by C-G formula based on SCr of 2.13 mg/dL (H)).   Medications:  Infusions:  . diltiazem (CARDIZEM) infusion 7.5 mg/hr (09/27/16 1516)  . levETIRAcetam Stopped (09/27/16 2140)    Assessment: 76 yo female with hx of Afib (NO anticoagulation PTA), dementia, CVA, seizures, bedbound, recent repair of left femoral pseduo aneurysm complictaed with infection at the surgical site treated with doxycycline now admitted with subjective fever cough shortness of breath and generalized body aches. On tele, found to be in Afib with RVR and started on cardizem drip which was stopped due to hypotension.  Pharmacy is consulted to dose IV heparin and  warfarin for anticoagulation for afib.  Significant events:  8/27 Pt pulled out NGT and refuses to have replaced.  Palliative meeting for discussion of potential peg tube placement  8/29 No NGT or peg tube per family, attempted swallow study again and failed  8/30 Follow up family meeting regarding PEG tube placement and GOC, heparin changed to lovenox  09/28/2016  INR 2.57,  therapeutic range despite not receiving a dose of warfarin since 8/23.  CBC:  H/H low but stable.  Plts WNL.  No bleeding per RN  Diet: NPO d/t immediate aspiration  CrCl ~ 18 mls/min   Goal of Therapy:  INR 2-3 Monitor platelets by anticoagulation protocol: Yes   Plan:   continue lovenox to 55mg  (1mg /kg) SQ q24h  Daily  INR and CBC  Hold warfarin doses (for large INR increase and no PO access)  F/u GOC , potential PEG placement and ability to take po anticoagulation.    Dolly Rias RPh 09/28/2016, 9:40 AM Pager [phone]

## 2016-09-28 NOTE — Progress Notes (Signed)
Patient ID: Casey [name], female   DOB: Jun 24, 1940, 76 y.o.   MRN: [id number]  PROGRESS NOTE    Casey [name]  TKP:[id number] DOB: 1940-07-20 DOA: 09/14/2016  PCP: Gildardo Cranker, DO   Brief Narrative:  76 year old female with history of CVA, seizures, dementia, chronic foley. Pt presented to ED with fevers and myalgias. Pt was found to have HCAP. She subsequently developed respiratory failure and required intubation. She also developed a fib and has required Cardizem drip. Additionally, she developed acute systolic CHF exacerbation requiring diureses. She has had NG tube for feeding but has removed it on her own. She remains high risk of aspiration. PCT following for goals of care.   Assessment & Plan:    Atrial fibrillation with RVR - CHA2DS2-VASc Score8 - Pt required Cardizem drip which was eventually changed to metoprolol but was in a fib last night so back on Cardizem drip 8/30 - Continue metoprolol  - Continue Lovenox for anticoagulation   Acute on chronic systolic CHF - EF 51-70% with diffuse hypokinesis - Stop lasix due to worsening Cr   Essential hypertension - Continue metoprolol   HCAP / Acute respiratory failure with hypoxia - Tracheal aspirate without any growth - Pt has required intubation from 8/17 through 8/22 - Completed 8 days of Abx  CKD stage 3 - Cr continues to trend up likely due to lasix - Hold lasix due to worsening renal failure   Hypernatremia - Due to dehydration, poor po intake  - Sodium trending up - Hope that with feeding tube we can get some more free fluid and that sodium can correct  Severe protein calorie malnutrition / Dysphagia / Aspiration risk - Pulled her own NG tube - High risk of aspiration - Palliative following for goals of care   Anemia of chronic disease - Hgb stable   Diabetes mellitus, type 2 with diabetic nephropathy - Continue SSI  Dementia without behavioral disturbance  - Stable   History of  CVA / Functional quadriplegia  - Plan for PEG tube   Seizure disorder - Continue Keppra   Left arm pain - LE doppler showed no DVT - No reports of pain    DVT prophylaxis: Lovenox subQ Code Status: full code  Family Communication: no family at the bedside Disposition Plan: plan for PEG tube now that family decided on pt having PEG tube   Consultants:   PCT  CCM  Cardio  IR  Procedures:   ETT 8/17 --> 8/22  LE doppler - no DVT  Antimicrobials:   Zosyn 8/16  Vanco 8/16  --> 8/19  Cefepime 8/18 --> 8/23   Subjective: Was in A fib overnight.  Objective: Vitals:   09/27/16 1256 09/27/16 1800 09/27/16 2010 09/28/16 0446  BP:  (!) 145/60 (!) 137/55 127/66  Pulse:  80 89 96  Resp:   18 16  Temp:   98.3 F (36.8 C) 98.8 F (37.1 C)  TempSrc:   Oral Oral  SpO2: 93%  100% 100%  Weight:    59.7 kg (131 lb 9.8 oz)  Height:        Intake/Output Summary (Last 24 hours) at 09/28/16 1037 Last data filed at 09/28/16 0600  Gross per 24 hour  Intake              385 ml  Output              575 ml  Net             -  190 ml   Filed Weights   09/26/16 0425 09/27/16 0401 09/28/16 0446  Weight: 54.5 kg (120 lb 2.4 oz) 55.4 kg (122 lb 2.2 oz) 59.7 kg (131 lb 9.8 oz)    Examination:  General exam: Appears calm and comfortable  Respiratory system: diminished breath sounds, coarse breath sounds  Cardiovascular system: S1 & S2 heard, irregular rhythm Gastrointestinal system: Abdomen is nondistended, soft and nontender. No organomegaly or masses felt. Normal bowel sounds heard. Central nervous system: No focal neurological deficits. Extremities: Symmetric 5 x 5 power. Skin: No rashes, lesions or ulcers Psychiatry: No restlessness or agitation  Data Reviewed: I have personally reviewed following labs and imaging studies  CBC:  Recent Labs Lab 09/24/16 0417 09/25/16 0507 09/26/16 0534 09/27/16 0343 09/28/16 0459  WBC 11.0* 11.0* 12.1* 14.3* 13.7*  HGB  9.0* 9.4* 9.7* 8.9* 8.3*  HCT 28.1* 29.7* 30.4* 29.2* 27.6*  MCV 81.7 83.4 82.6 85.1 85.4  PLT 296 307 333 311 161   Basic Metabolic Panel:  Recent Labs Lab 09/21/16 1528 09/21/16 1803 09/22/16 0500 09/23/16 0435 09/26/16 0534 09/27/16 0343 09/28/16 0459  NA  --   --  142 140 147* 150* 152*  K  --   --  3.5 3.2* 3.4* 3.9 4.0  CL  --   --  104 100* 103 110 113*  CO2  --   --  31 33* '28 30 31  ' GLUCOSE  --   --  132* 202* 126* 144* 125*  BUN  --   --  37* 33* 37* 43* 47*  CREATININE  --   --  1.32* 1.25* 1.86* 2.11* 2.13*  CALCIUM  --   --  9.9 9.6 10.1 9.9 9.9  MG 2.8* 2.8* 2.4  --   --  1.9  --    GFR: Estimated Creatinine Clearance: 18.6 mL/min (A) (by C-G formula based on SCr of 2.13 mg/dL (H)). Liver Function Tests: No results for input(s): AST, ALT, ALKPHOS, BILITOT, PROT, ALBUMIN in the last 168 hours. No results for input(s): LIPASE, AMYLASE in the last 168 hours. No results for input(s): AMMONIA in the last 168 hours. Coagulation Profile:  Recent Labs Lab 09/24/16 0417 09/25/16 0507 09/26/16 0534 09/27/16 0343 09/28/16 0459  INR 1.57 1.68 1.84 2.45 2.57   Cardiac Enzymes: No results for input(s): CKTOTAL, CKMB, CKMBINDEX, TROPONINI in the last 168 hours. BNP (last 3 results) No results for input(s): PROBNP in the last 8760 hours. HbA1C: No results for input(s): HGBA1C in the last 72 hours. CBG:  Recent Labs Lab 09/27/16 1156 09/27/16 1621 09/27/16 2009 09/28/16 0136 09/28/16 0802  GLUCAP 138* 114* 123* 127* 147*   Lipid Profile: No results for input(s): CHOL, HDL, LDLCALC, TRIG, CHOLHDL, LDLDIRECT in the last 72 hours. Thyroid Function Tests: No results for input(s): TSH, T4TOTAL, FREET4, T3FREE, THYROIDAB in the last 72 hours. Anemia Panel: No results for input(s): VITAMINB12, FOLATE, FERRITIN, TIBC, IRON, RETICCTPCT in the last 72 hours. Urine analysis:    Component Value Date/Time   COLORURINE YELLOW 08/29/2016 1922   APPEARANCEUR CLEAR  08/29/2016 1922   LABSPEC 1.012 08/29/2016 1922   PHURINE 7.0 08/29/2016 1922   GLUCOSEU NEGATIVE 08/29/2016 1922   HGBUR SMALL (A) 08/29/2016 1922   BILIRUBINUR NEGATIVE 08/29/2016 1922   KETONESUR NEGATIVE 08/29/2016 1922   PROTEINUR 100 (A) 08/29/2016 1922   UROBILINOGEN 0.2 02/02/2013 2008   NITRITE NEGATIVE 08/29/2016 1922   LEUKOCYTESUR TRACE (A) 08/29/2016 1922   Sepsis Labs: '@LABRCNTIP' (procalcitonin:4,lacticidven:4)   )No  results found for this or any previous visit (from the past 240 hour(s)).    Radiology Studies: No results found.   Scheduled Meds: . bacitracin  1 application Topical BID  . chlorhexidine gluconate (MEDLINE KIT)  15 mL Mouth Rinse BID  . enoxaparin (LOVENOX) injection  1 mg/kg Subcutaneous Q24H  . furosemide  40 mg Intravenous Daily  . insulin aspart  0-15 Units Subcutaneous Q4H  . mouth rinse  15 mL Mouth Rinse QID  . metoprolol tartrate  5 mg Intravenous Q6H  . pantoprazole sodium  40 mg Per Tube Q24H  . sodium chloride flush  10-40 mL Intracatheter Q12H   Continuous Infusions: . diltiazem (CARDIZEM) infusion 7.5 mg/hr (09/27/16 1516)  . levETIRAcetam 250 mg (09/28/16 0946)     LOS: 14 days    Time spent: 25 minutes  Greater than 50% of the time spent on counseling and coordinating the care.   Leisa Lenz, MD [facility] Pager [phone]  If 7PM-7AM, please contact night-coverage [email or web address] Password [facility] 09/28/2016, 10:37 AM

## 2016-09-28 NOTE — Progress Notes (Signed)
Nutrition Follow-up  DOCUMENTATION CODES:   Severe malnutrition in context of acute illness/injury  INTERVENTION:  - Will monitor for PEG placement per family desire. - RD to provide TF recommendations following procedure.   NUTRITION DIAGNOSIS:   Malnutrition (severe) related to acute illness, wound healing (abdominal wound) as evidenced by moderate depletion of body fat, severe depletion of muscle mass. -ongoing  GOAL:   Patient will meet greater than or equal to 90% of their needs -unable to meet   MONITOR:   Weight trends, Labs, Skin, I & O's, Other (Comment) (POC/GOC)   ASSESSMENT:   76 year old woman with dementia, bedbound for 3 years, recent admission to [facility] for infected groin wound with staph following pseudoaneurysm repair admitted 8/17 with fevers, shortness of breath and cough, chest x-ray showing multifocal bilateral patchy infiltrates and atrial fibrillation/RVR. She was hypoxic requiring 2 L. She was started on Cardizem drip and became hypotensive and hence this was stopped. Developed respiratory distress and was emergently intubated in the ED  8/31 Pt remains NPO. Dr. Domingo Cocking talked with pt's children yesterday evening and they would like pt to remain full code and to proceed with PEG placement. Reviewed Dr. Fortino Sic note from this AM. No date listed for planned PEG placement; will continue to monitor for this. Will order TF after PEG is placed; no TF order in place at this time. Current weight is -3 lbs/1.5 kg since assessment on 8/27.  Medications reviewed; sliding scale Novolog, 40 mg Protonix/day.  Labs reviewed; CBGs: 127 and 147 mg/dL today, Na: 152 mmol/L, Cl: 113 mmol/L, BUN: 47 mg/dL, creatinine: 2.13 mg/dL.    8/27 - She remains NPO per recommendations following MBS.  - Current weight is -8 lbs/3.3 kg from weight on 8/23.  - Patient pulled Panda tube earlier this AM and Dr. Julianne Rice note from this AM states that pt and family (son and daughter) are  agreeable to PEG placement. - Palliative Care following and plan for family meeting tomorrow for Dendron.    8/23 - Pt with Panda in place and receiving Jevity 1.2 @ 40 mL/hr with 30 mL Prostat BID and 100 mL free water every 4 hours.  - This regimen is providing 1352 kcal, 83 grams of protein, and 1375 mL free water.  - SLP performed bedside swallow evaluation earlier this AM and recommends NPO with plan to follow-up tomorrow.  - Per rounds this AM, plans to transfer to telemetry if bed becomes available.  - Weight stable from yesterday and only +0.4 kg from admission weight. PCCM NP note from this AM states plan to continue free water per [facility] until PO intake becomes possible.     Diet Order:  Diet NPO time specified  Skin:   (abdominal and groin incisions)  Last BM:  8/31  Height:   Ht Readings from Last 1 Encounters:  09/14/16 5\' 3"  (1.6 m)    Weight:   Wt Readings from Last 1 Encounters:  09/28/16 131 lb 9.8 oz (59.7 kg)    Ideal Body Weight:  52.3 kg  BMI:  Body mass index is 23.31 kg/m.  Estimated Nutritional Needs:   Kcal:  1250-1450  Protein:  75-85g  Fluid:  1.5L/day  EDUCATION NEEDS:   No education needs identified at this time    Jarome Matin, MS, RD, LDN, CNSC Inpatient Clinical Dietitian Pager # [phone] After hours/weekend pager # [phone]

## 2016-09-28 NOTE — Consult Note (Signed)
Chief Complaint: dysphagia   Referring Physician:Dr. Leisa Lenz  Supervising Physician: Aletta Edouard  Patient Status: [facility] - In-pt  HPI: Casey [name] is a 76 y.o. female with a history of CVA, seizures, dementia, who presented to the [facility] secondary to fevers.  She was found to have HCAP.  She developed respiratory failure and required intubation as well as new onset A fib for which she is on a cardizem drip.  She was able to be extubated eventually.  She had a post extubation swallow study that she was unable to pass.  She has evidence of aspiration.  Palliative care has seen the patient and has discussed their concerns regarding placement of a gastrostomy tube; however, the patient's daughter and son still want to proceed.  Therefore, we have been consulted for placement.  Past Medical History:  Past Medical History:  Diagnosis Date  . Abdominal or pelvic swelling, mass, or lump, left upper quadrant   . CHF (congestive heart failure) ([place])   . Chronic atrial fibrillation ([place])   . Chronic kidney disease   . Congestive heart failure, unspecified   . Coronary atherosclerosis of unspecified type of vessel, native or graft   . Diabetes mellitus   . Edema   . GERD (gastroesophageal reflux disease)   . Gout, unspecified   . Hypercholesterolemia   . Hypertension   . 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  . Peripheral vascular disease, unspecified ([place])   . Seizures ([place])   . Sinus of Valsalva aneurysm    a. By 2D echo 05/2011.  . Stroke ([place])   . Unspecified hereditary and idiopathic peripheral neuropathy   . Unspecified urinary incontinence   . Vascular dementia, uncomplicated     Past Surgical History:  Past Surgical History:  Procedure Laterality Date  . AORTOGRAM  09/01/2008   Abd w/ bilateral lower extremity runoff  arteriography  . APPLICATION OF WOUND VAC Left 08/30/2016   Procedure: APPLICATION OF WOUND VAC LEFT GROIN;  Surgeon: Serafina Mitchell, MD;  Location: [place];  Service: Vascular;  Laterality: Left;  . ENDARTERECTOMY FEMORAL Left 08/30/2016   Procedure: LEFT COMMON FEMORAL ARTERY ENDARTERECTOMY;  Surgeon: Serafina Mitchell, MD;  Location: [place];  Service: Vascular;  Laterality: Left;  . FALSE ANEURYSM REPAIR  12/06/2011   Procedure: REPAIR FALSE ANEURYSM;  Surgeon: Angelia Mould, MD;  Location: [facility] OR;  Service: Vascular;  Laterality: Left;  Repair of left femoral Artery pseudoaneurysm  . FALSE ANEURYSM REPAIR Left 08/30/2016   Procedure: REPAIR PSEUDOANEURYSM LEFT GROIN;  Surgeon: Serafina Mitchell, MD;  Location: [place];  Service: Vascular;  Laterality: Left;  . FEMORAL ARTERY EXPLORATION  12/06/2011   Procedure: FEMORAL ARTERY EXPLORATION;  Surgeon: Angelia Mould, MD;  Location: [facility] OR;  Service: Vascular;  Laterality: N/A;  Exploration of large pseudoaneurysm left side of fem-fem bypass graft   . femoral to femoral bypass graft     . FEMORAL-FEMORAL BYPASS GRAFT  12/06/2011   Procedure: BYPASS GRAFT FEMORAL-FEMORAL ARTERY;  Surgeon: Angelia Mould, MD;  Location: [facility] OR;  Service: Vascular;  Laterality: Bilateral;  Revision of left to right Femoral-Femoral bypass graft  . FEMORAL-FEMORAL BYPASS GRAFT Left 08/30/2016   Procedure: REDO RIGHT TO LEFT FEMORAL-FEMORAL ARTERY BYPASS GRAFT;  Surgeon: Serafina Mitchell, MD;  Location: [place];  Service: Vascular;  Laterality: Left;  . FEMORAL-POPLITEAL BYPASS GRAFT Left 05/2002   lower extremity femoral to below knee w/ non-versed greater saphenous vein  . FEMORAL-POPLITEAL BYPASS GRAFT Left 11/2002  . FEMORAL-POPLITEAL BYPASS GRAFT Left 08/30/2016   Procedure: REVISION OF LEFT FEMORAL-POPLITEAL ARTERY BYPASS GRAFT;  Surgeon: Serafina Mitchell, MD;  Location: [place];  Service: Vascular;  Laterality: Left;  . THROMBECTOMY FEMORAL ARTERY Left 08/30/2016    Procedure: THROMBECTOMY OF FEMORAL-FEMORAL ARTERY BYPASS GRAFT AND LEFT FEMORAL TO LEFT POPLITEAL BYPASS GRAFT;  Surgeon: Serafina Mitchell, MD;  Location: [place];  Service: Vascular;  Laterality: Left;  . TUBAL LIGATION Bilateral     Family History:  Family History  Problem Relation Age of Onset  . Cancer Mother        Cervical  . Stroke Mother   . Diabetes Mother   . Stroke Father   . Heart disease Father     Social History:  reports that she quit smoking about 15 years ago. Her smoking use included Cigarettes. She has never used smokeless tobacco. She reports that she does not drink alcohol or use drugs.  Allergies:  Allergies  Allergen Reactions  . Penicillins Other (See Comments)    Told by a doctor to "not take" and on MAR as an allergy Has patient had a PCN reaction causing immediate rash, facial/tongue/throat swelling, SOB or lightheadedness with hypotension: Unknown Has patient had a PCN reaction causing severe rash involving mucus membranes or skin necrosis: Unknown Has patient had a PCN reaction that required hospitalization: Unknown Has patient had a PCN reaction occurring within the last 10 years: Unknown If all of the above answers are "NO", then may proceed with Ceph    Medications: Medications reviewed in epic  ROS: unable to be obtained.  Patient is upset and adamant she does NOT want a feeding tube and will not cooperate with exam.  Mallampati Score: MD Evaluation Airway: WNL Heart: Other (comments) Heart  comments: a fib Abdomen: WNL Chest/ Lungs: WNL ASA  Classification: 3 Mallampati/Airway Score: One  Physical Exam: BP (!) 123/46 (BP Location: Right Arm)   Pulse 78   Temp 98.1 F (36.7 C) (Oral)   Resp 16   Ht _0  (1.6 m)   Wt 131 lb 9.8 oz (59.7 kg)   SpO2 100%   BMI 23.31 kg/m  Body mass index is 23.31 kg/m. General: elderly black female who is laying in bed in NAD HEENT: head is normocephalic, atraumatic.  Sclera are noninjected.   PERRL.  Ears and nose without any masses or lesions.  Mouth is pink with poor dentition. Heart: irregular.  Normal s1,s2. No obvious murmurs, gallops, or rubs noted.  Lungs: coarse breath sounds, no wheezes, rhonchi, or rales noted.  Respiratory effort nonlabored Abd: soft, NT, ND, +BS, no masses, hernias, or organomegaly Psych: Alert.  She is aware she does not remember things, but states she still has her "senses" about what she wants or doesn't want.   Labs: Results for orders placed or performed during the hospital encounter of 09/14/16 (from the past 48 hour(s))  Glucose, capillary     Status: Abnormal   Collection Time: 09/26/16  4:51 PM  Result Value Ref Range   Glucose-Capillary 131 (H) 65 - 99 mg/dL  Glucose, capillary     Status: Abnormal   Collection Time: 09/26/16  9:13 PM  Result Value Ref Range   Glucose-Capillary 157 (H) 65 - 99 mg/dL   Comment 1 Notify RN  Glucose, capillary     Status: Abnormal   Collection Time: 09/26/16 11:25 PM  Result Value Ref Range   Glucose-Capillary 143 (H) 65 - 99 mg/dL   Comment 1 Notify RN   Protime-INR     Status: Abnormal   Collection Time: 09/27/16  3:43 AM  Result Value Ref Range   Prothrombin Time 26.3 (H) 11.4 - 15.2 seconds   INR 2.45   CBC     Status: Abnormal   Collection Time: 09/27/16  3:43 AM  Result Value Ref Range   WBC 14.3 (H) 4.0 - 10.5 K/uL   RBC 3.43 (L) 3.87 - 5.11 MIL/uL   Hemoglobin 8.9 (L) 12.0 - 15.0 g/dL   HCT 29.2 (L) 36.0 - 46.0 %   MCV 85.1 78.0 - 100.0 fL   MCH 25.9 (L) 26.0 - 34.0 pg   MCHC 30.5 30.0 - 36.0 g/dL   RDW 17.3 (H) 11.5 - 15.5 %   Platelets 311 150 - 400 K/uL  Heparin level (unfractionated)     Status: Abnormal   Collection Time: 09/27/16  3:43 AM  Result Value Ref Range   Heparin Unfractionated 0.78 (H) 0.30 - 0.70 IU/mL    Comment:        IF HEPARIN RESULTS ARE BELOW EXPECTED VALUES, AND PATIENT DOSAGE HAS BEEN CONFIRMED, SUGGEST FOLLOW UP TESTING OF ANTITHROMBIN III LEVELS.     Basic metabolic panel     Status: Abnormal   Collection Time: 09/27/16  3:43 AM  Result Value Ref Range   Sodium 150 (H) 135 - 145 mmol/L   Potassium 3.9 3.5 - 5.1 mmol/L   Chloride 110 101 - 111 mmol/L   CO2 30 22 - 32 mmol/L   Glucose, Bld 144 (H) 65 - 99 mg/dL   BUN 43 (H) 6 - 20 mg/dL   Creatinine, Ser 2.11 (H) 0.44 - 1.00 mg/dL   Calcium 9.9 8.9 - 10.3 mg/dL   GFR calc non Af Amer 22 (L) >60 mL/min   GFR calc Af Amer 25 (L) >60 mL/min    Comment: (NOTE) The eGFR has been calculated using the CKD EPI equation. This calculation has not been validated in all clinical situations. eGFR's persistently <60 mL/min signify possible Chronic Kidney Disease.    Anion gap 10 5 - 15  Magnesium     Status: None   Collection Time: 09/27/16  3:43 AM  Result Value Ref Range   Magnesium 1.9 1.7 - 2.4 mg/dL  Glucose, capillary     Status: Abnormal   Collection Time: 09/27/16  4:19 AM  Result Value Ref Range   Glucose-Capillary 140 (H) 65 - 99 mg/dL  Glucose, capillary     Status: Abnormal   Collection Time: 09/27/16  7:52 AM  Result Value Ref Range   Glucose-Capillary 123 (H) 65 - 99 mg/dL  Glucose, capillary     Status: Abnormal   Collection Time: 09/27/16 11:56 AM  Result Value Ref Range   Glucose-Capillary 138 (H) 65 - 99 mg/dL  Glucose, capillary     Status: Abnormal   Collection Time: 09/27/16  4:21 PM  Result Value Ref Range   Glucose-Capillary 114 (H) 65 - 99 mg/dL  Glucose, capillary     Status: Abnormal   Collection Time: 09/27/16  8:09 PM  Result Value Ref Range   Glucose-Capillary 123 (H) 65 - 99 mg/dL   Comment 1 Notify RN    Comment 2 Document in Chart   Glucose, capillary  Status: Abnormal   Collection Time: 09/28/16  1:36 AM  Result Value Ref Range   Glucose-Capillary 127 (H) 65 - 99 mg/dL   Comment 1 Notify RN    Comment 2 Document in Chart   Protime-INR     Status: Abnormal   Collection Time: 09/28/16  4:59 AM  Result Value Ref Range   Prothrombin Time  27.4 (H) 11.4 - 15.2 seconds   INR 2.57   CBC     Status: Abnormal   Collection Time: 09/28/16  4:59 AM  Result Value Ref Range   WBC 13.7 (H) 4.0 - 10.5 K/uL   RBC 3.23 (L) 3.87 - 5.11 MIL/uL   Hemoglobin 8.3 (L) 12.0 - 15.0 g/dL   HCT 27.6 (L) 36.0 - 46.0 %   MCV 85.4 78.0 - 100.0 fL   MCH 25.7 (L) 26.0 - 34.0 pg   MCHC 30.1 30.0 - 36.0 g/dL   RDW 17.0 (H) 11.5 - 15.5 %   Platelets 275 150 - 400 K/uL  Basic metabolic panel     Status: Abnormal   Collection Time: 09/28/16  4:59 AM  Result Value Ref Range   Sodium 152 (H) 135 - 145 mmol/L   Potassium 4.0 3.5 - 5.1 mmol/L   Chloride 113 (H) 101 - 111 mmol/L   CO2 31 22 - 32 mmol/L   Glucose, Bld 125 (H) 65 - 99 mg/dL   BUN 47 (H) 6 - 20 mg/dL   Creatinine, Ser 2.13 (H) 0.44 - 1.00 mg/dL   Calcium 9.9 8.9 - 10.3 mg/dL   GFR calc non Af Amer 21 (L) >60 mL/min   GFR calc Af Amer 25 (L) >60 mL/min    Comment: (NOTE) The eGFR has been calculated using the CKD EPI equation. This calculation has not been validated in all clinical situations. eGFR's persistently <60 mL/min signify possible Chronic Kidney Disease.    Anion gap 8 5 - 15  Glucose, capillary     Status: Abnormal   Collection Time: 09/28/16  8:02 AM  Result Value Ref Range   Glucose-Capillary 147 (H) 65 - 99 mg/dL  Glucose, capillary     Status: Abnormal   Collection Time: 09/28/16 12:10 PM  Result Value Ref Range   Glucose-Capillary 122 (H) 65 - 99 mg/dL    Imaging: No results found.  Assessment/Plan 1. Dysphagia with aspiration 2. Dementia  We have been asked to see the patient for placement of a gastrostomy tube.  This is technically possible as her anatomy is amenable to placement.  The patient, despite her dementia, is ADAMANT, that she does not want a feeding tube placed in her stomach.  So much so, that she essentially refuses to cooperate with her history, answering questions, and some of her exam.  She says she doesn't want her children to make this  decision for her because she does not want this tube to be placed.  She understands that this tube would be in her stomach and not in her nose.  I called her daughter, Lupita Leash who is her HCPOA, and relayed these concerns to her.  She stated that her mom thinks we are just talking about a tube down her nose and that we should proceed as her mother "can't starve to death."  I thoroughly discussed with the daughter all the possible risks and complications of a g-tube placement in a patient that has dementia.  She has a risk of pulling her g-tube out.  If this were  to happen prior to her tract being mature it puts the patient at high risk for intra-abdominal infection that may lead to surgery or abscess formation requiring percutaneous drain placements.  We also discussed that her mother is at risk for even aspirating her secretions, therefore, she is at risk for aspirating her tube feeds and that this g-tube may not accomplish the goals she is envisioning.  We discussed the risk of bleeding as well.  Her coumadin is being held and she is currently on lovenox.  We also discussed the risk of injury to surrounding structures, including the colon or small bowel.  The daughter understands all of this along with the fact that her mother has very clearly stated on her living will, as well as currently, that she does NOT want a feeding tube.  Consent was obtained over the phone to proceed next Tuesday if scheduling allows.    Risks and benefits discussed with the patient including, but not limited to the need for a barium enema during the procedure, bleeding, infection, peritonitis, or damage to adjacent structures. All of the patient's questions were answered, patient is agreeable to proceed. Consent signed and in chart.  Thank you for this interesting consult.  I greatly enjoyed meeting Lucilla A Severa and look forward to participating in their care.  A copy of this report was sent to the requesting provider on  this date.  Electronically Signed: Henreitta Cea 09/28/2016, 3:25 PM   I spent a total of 55 Miinutes    in face to face in clinical consultation, greater than 50% of which was counseling/coordinating care for dysphagia, g-tube placement

## 2016-09-29 LAB — BASIC METABOLIC PANEL
ANION GAP: 5 (ref 5–15)
BUN: 49 mg/dL — ABNORMAL HIGH (ref 6–20)
CALCIUM: 9.8 mg/dL (ref 8.9–10.3)
CHLORIDE: 119 mmol/L — AB (ref 101–111)
CO2: 31 mmol/L (ref 22–32)
CREATININE: 1.84 mg/dL — AB (ref 0.44–1.00)
GFR calc non Af Amer: 26 mL/min — ABNORMAL LOW (ref >60)
GFR, EST AFRICAN AMERICAN: 30 mL/min — AB (ref >60)
GLUCOSE: 133 mg/dL — AB (ref 65–99)
Potassium: 4 mmol/L (ref 3.5–5.1)
Sodium: 155 mmol/L — ABNORMAL HIGH (ref 135–145)

## 2016-09-29 LAB — PROTIME-INR
INR: 3.16
Prothrombin Time: 32.2 seconds — ABNORMAL HIGH (ref 11.4–15.2)

## 2016-09-29 LAB — CBC
HEMATOCRIT: 24.2 % — AB (ref 36.0–46.0)
HEMOGLOBIN: 7.3 g/dL — AB (ref 12.0–15.0)
MCH: 26.5 pg (ref 26.0–34.0)
MCHC: 30.2 g/dL (ref 30.0–36.0)
MCV: 88 fL (ref 78.0–100.0)
Platelets: 256 10*3/uL (ref 150–400)
RBC: 2.75 MIL/uL — ABNORMAL LOW (ref 3.87–5.11)
RDW: 17.4 % — AB (ref 11.5–15.5)
WBC: 11.8 10*3/uL — ABNORMAL HIGH (ref 4.0–10.5)

## 2016-09-29 LAB — GLUCOSE, CAPILLARY
GLUCOSE-CAPILLARY: 155 mg/dL — AB (ref 65–99)
GLUCOSE-CAPILLARY: 156 mg/dL — AB (ref 65–99)
Glucose-Capillary: 144 mg/dL — ABNORMAL HIGH (ref 65–99)
Glucose-Capillary: 147 mg/dL — ABNORMAL HIGH (ref 65–99)
Glucose-Capillary: 144 mg/dL — ABNORMAL HIGH (ref 65–99)

## 2016-09-29 MED ORDER — DIGOXIN 0.25 MG/ML IJ SOLN
0.2500 mg | Freq: Once | INTRAMUSCULAR | Status: AC
  Administered 2016-09-29: 0.25 mg via INTRAVENOUS
  Filled 2016-09-29: qty 1

## 2016-09-29 NOTE — Progress Notes (Signed)
CCMD made nurse aware of patient becoming tachycardic while coughing, pt HR went to 170's but did not sustain, nurse checked on patient and noted that patient is not in any distress. Nurse made MD aware.

## 2016-09-29 NOTE — Progress Notes (Signed)
Pt's HR sustaining in 130's-140's per tele monitor. BP 104/90. Pt resting in bed in no distress. NP on call, Bl[place], made aware. Digoxin IV ordered and administered. No changes to cardizem drip at this time. Will continue to monitor.

## 2016-09-29 NOTE — Progress Notes (Signed)
ANTICOAGULATION CONSULT NOTE - Follow Up Consult  Pharmacy Consult for Enoxaparin, Warfarin Indication: atrial fibrillation  Allergies  Allergen Reactions  . Penicillins Other (See Comments)    Told by a doctor to "not take" and on MAR as an allergy Has patient had a PCN reaction causing immediate rash, facial/tongue/throat swelling, SOB or lightheadedness with hypotension: Unknown Has patient had a PCN reaction causing severe rash involving mucus membranes or skin necrosis: Unknown Has patient had a PCN reaction that required hospitalization: Unknown Has patient had a PCN reaction occurring within the last 10 years: Unknown If all of the above answers are "NO", then may proceed with Ceph    Patient Measurements: Height: 5\' 3"  (160 cm) Weight: 123 lb 3.8 oz (55.9 kg) IBW/kg (Calculated) : 52.4 Heparin Dosing Weight: 64 kg  Vital Signs: Temp: 98.5 F (36.9 C) (09/01 0424) Temp Source: Oral (09/01 0424) BP: 127/53 (09/01 0424) Pulse Rate: 83 (09/01 0424)  Labs:  Recent Labs  09/27/16 0343 09/28/16 0459 09/29/16 0544  HGB 8.9* 8.3* 7.3*  HCT 29.2* 27.6* 24.2*  PLT 311 275 256  LABPROT 26.3* 27.4* 32.2*  INR 2.45 2.57 3.16  HEPARINUNFRC 0.78*  --   --   CREATININE 2.11* 2.13* 1.84*    Estimated Creatinine Clearance: 21.5 mL/min (A) (by C-G formula based on SCr of 1.84 mg/dL (H)).   Medications:  Infusions:  . diltiazem (CARDIZEM) infusion 7.5 mg/hr (09/29/16 0436)  . levETIRAcetam Stopped (09/28/16 2212)  . [START ON 10/02/2016] vancomycin      Assessment: 76 yo female with hx of Afib (NO anticoagulation PTA), dementia, CVA, seizures, bedbound, recent repair of left femoral pseduo aneurysm complictaed with infection at the surgical site treated with doxycycline now admitted with subjective fever cough shortness of breath and generalized body aches. On tele, found to be in Afib with RVR and started on cardizem drip which was stopped due to hypotension.  Pharmacy is  consulted to dose IV heparin and warfarin for anticoagulation for afib.  Significant events:  8/27 Pt pulled out NGT and refuses to have replaced.  Palliative meeting for discussion of potential peg tube placement  8/29 No NGT or peg tube per family, attempted swallow study again and failed  8/30 Follow up family meeting regarding PEG tube placement and GOC, heparin changed to lovenox  Plan for peg tube placement 9/4  09/29/2016  INR 3.16,  supratherapeutic range despite not receiving a dose of warfarin since 8/23.  CBC:  H/H low but stable.  Plts WNL.  No bleeding per RN  Diet: NPO d/t immediate aspiration  CrCl ~ 21 mls/min   Goal of Therapy:  INR 2-3 Monitor platelets by anticoagulation protocol: Yes   Plan:   continue lovenox to 55mg  (1mg /kg) SQ q24h  Daily  INR and CBC  Hold warfarin doses (for supratherapeutic, no PO access and upcoming procedure)  F/u GOC , potential PEG placement and ability to take po anticoagulation.    Dolly Rias RPh 09/29/2016, 8:56 AM Pager [phone]

## 2016-09-29 NOTE — Progress Notes (Addendum)
Patient ID: Casey [name], female   DOB: [birth year], 76 y.o.   MRN: [id number]  PROGRESS NOTE    Casey [name]  NLG:[id number] DOB: 02/01/1940 DOA: 09/14/2016  PCP: Gildardo Cranker, DO   Brief Narrative:  76 year old female with history of CVA, seizures, dementia, chronic foley. Pt presented to ED with fevers and myalgias. Pt was found to have HCAP. She subsequently developed respiratory failure and required intubation. She also developed a fib and has required Cardizem drip. Additionally, she developed acute systolic CHF exacerbation requiring diureses. She has had NG tube for feeding but has removed it on her own. She remains high risk of aspiration. PCT following for goals of care. Plan for feeding tube to be placed by IR 9/4.   Assessment & Plan:    Atrial fibrillation with RVR - CHA2DS2-VASc Score8 - On Cardizem drip for HR control, went in to a fib on the night of 8/30 -8/31 - Continue metoprolol - Lovenox for AC  Acute on chronic systolic CHF - EF 08-14% with diffuse hypokinesis - Lasix stopped due to worsening renal function  - Cr better this am, trended down from 2.13 to 1.84  Essential hypertension - Continue metoprolol   HCAP / Acute respiratory failure with hypoxia - Tracheal aspirate without any growth - Pt has required intubation from 8/17 through 8/22 - Completed 8 days of Abx  CKD stage 3 - Cr continues to trend up likely due to lasix - Lasix held from 8/31 and Cr down to 1.84 this am  Hypernatremia - Due to dehydration, poor po intake  - Sodium continues to trend up - Plan for feeding tube 9/4 and will have free water flushes which will hopefully drive sodium down  Severe protein calorie malnutrition / Dysphagia / Aspiration risk - Pulled her own NG tube - High risk of aspiration - Palliative following for goals of care   Anemia of chronic disease - Hgb7.3 this am - Transfuse for hgb less than 7  Diabetes mellitus, type 2 with  diabetic nephropathy - Continue SSI  Dementia without behavioral disturbance  - Stable   History of CVA / Functional quadriplegia  - PEG placement 9/4  Seizure disorder - Continue Keppra   Left arm pain - LE doppler showed no DVT - No reports of pain    DVT prophylaxis: Lovenox subQ Code Status: full code  Family Communication: no family at the bedside this am; called pt daughter this am, left VM to call me back for updates  Disposition Plan: plan for feeding tube 9/4   Consultants:   PCT  CCM  Cardio  IR  Procedures:   ETT 8/17 --> 8/22  LE doppler - no DVT  Antimicrobials:   Zosyn 8/16  Vanco 8/16  --> 8/19  Cefepime 8/18 --> 8/23   Subjective: No overnight events.  Objective: Vitals:   09/28/16 1400 09/28/16 2018 09/29/16 0422 09/29/16 0424  BP: (!) 123/46 (!) 136/44  (!) 127/53  Pulse: 78 80  83  Resp: 16 18    Temp: 98.1 F (36.7 C) 99.3 F (37.4 C)  98.5 F (36.9 C)  TempSrc: Oral Oral  Oral  SpO2: 100% 100%  100%  Weight:   55.9 kg (123 lb 3.8 oz)   Height:        Intake/Output Summary (Last 24 hours) at 09/29/16 1033 Last data filed at 09/29/16 0820  Gross per 24 hour  Intake  262.5 ml  Output              925 ml  Net           -662.5 ml   Filed Weights   09/27/16 0401 09/28/16 0446 09/29/16 0422  Weight: 55.4 kg (122 lb 2.2 oz) 59.7 kg (131 lb 9.8 oz) 55.9 kg (123 lb 3.8 oz)    Physical Exam  Constitutional: Appears ill. No distress.  CVS: RRR, S1/S2 + Pulmonary: Effort and breath sounds normal, no stridor, rhonchi, wheezes, rales.  Abdominal: Soft. BS +,  no distension, tenderness, rebound or guarding.  Musculoskeletal: Normal range of motion. No edema and no tenderness. . Neuro: Alert. No focal deficits  Skin: Skin is warm and dry.  Psychiatric: Normal mood and affect.     Data Reviewed: I have personally reviewed following labs and imaging studies  CBC:  Recent Labs Lab 09/25/16 0507  09/26/16 0534 09/27/16 0343 09/28/16 0459 09/29/16 0544  WBC 11.0* 12.1* 14.3* 13.7* 11.8*  HGB 9.4* 9.7* 8.9* 8.3* 7.3*  HCT 29.7* 30.4* 29.2* 27.6* 24.2*  MCV 83.4 82.6 85.1 85.4 88.0  PLT 307 333 311 275 568   Basic Metabolic Panel:  Recent Labs Lab 09/23/16 0435 09/26/16 0534 09/27/16 0343 09/28/16 0459 09/29/16 0544  NA 140 147* 150* 152* 155*  K 3.2* 3.4* 3.9 4.0 4.0  CL 100* 103 110 113* 119*  CO2 33* _0 GLUCOSE 202* 126* 144* 125* 133*  BUN 33* 37* 43* 47* 49*  CREATININE 1.25* 1.86* 2.11* 2.13* 1.84*  CALCIUM 9.6 10.1 9.9 9.9 9.8  MG  --   --  1.9  --   --    GFR: Estimated Creatinine Clearance: 21.5 mL/min (A) (by C-G formula based on SCr of 1.84 mg/dL (H)). Liver Function Tests: No results for input(s): AST, ALT, ALKPHOS, BILITOT, PROT, ALBUMIN in the last 168 hours. No results for input(s): LIPASE, AMYLASE in the last 168 hours. No results for input(s): AMMONIA in the last 168 hours. Coagulation Profile:  Recent Labs Lab 09/25/16 0507 09/26/16 0534 09/27/16 0343 09/28/16 0459 09/29/16 0544  INR 1.68 1.84 2.45 2.57 3.16   Cardiac Enzymes: No results for input(s): CKTOTAL, CKMB, CKMBINDEX, TROPONINI in the last 168 hours. BNP (last 3 results) No results for input(s): PROBNP in the last 8760 hours. HbA1C: No results for input(s): HGBA1C in the last 72 hours. CBG:  Recent Labs Lab 09/28/16 1604 09/28/16 2025 09/28/16 2352 09/29/16 0409 09/29/16 0753  GLUCAP 101* 131* 117* 144* 155*   Lipid Profile: No results for input(s): CHOL, HDL, LDLCALC, TRIG, CHOLHDL, LDLDIRECT in the last 72 hours. Thyroid Function Tests: No results for input(s): TSH, T4TOTAL, FREET4, T3FREE, THYROIDAB in the last 72 hours. Anemia Panel: No results for input(s): VITAMINB12, FOLATE, FERRITIN, TIBC, IRON, RETICCTPCT in the last 72 hours. Urine analysis:    Component Value Date/Time   COLORURINE YELLOW 08/29/2016 [place] 08/29/2016 1922    LABSPEC 1.012 08/29/2016 1922   PHURINE 7.0 08/29/2016 1922   GLUCOSEU NEGATIVE 08/29/2016 1922   HGBUR SMALL (A) 08/29/2016 [place] NEGATIVE 08/29/2016 [place] NEGATIVE 08/29/2016 1922   PROTEINUR 100 (A) 08/29/2016 1922   UROBILINOGEN 0.2 02/02/2013 2008   NITRITE NEGATIVE 08/29/2016 1922   LEUKOCYTESUR TRACE (A) 08/29/2016 1922   Sepsis Labs: _1 (procalcitonin:4,lacticidven:4)   )No results found for this or any previous visit (from the past 240 hour(s)).    Radiology Studies: No  results found.   Scheduled Meds: . bacitracin  1 application Topical BID  . chlorhexidine gluconate (MEDLINE KIT)  15 mL Mouth Rinse BID  . enoxaparin (LOVENOX) injection  1 mg/kg Subcutaneous Q24H  . insulin aspart  0-15 Units Subcutaneous Q4H  . mouth rinse  15 mL Mouth Rinse QID  . metoprolol tartrate  5 mg Intravenous Q6H  . pantoprazole sodium  40 mg Per Tube Q24H  . sodium chloride flush  10-40 mL Intracatheter Q12H   Continuous Infusions: . diltiazem (CARDIZEM) infusion 7.5 mg/hr (09/29/16 0436)  . levETIRAcetam 250 mg (09/29/16 0930)  . [START ON 10/02/2016] vancomycin       LOS: 15 days    Time spent: 25 minutes  Greater than 50% of the time spent on counseling and coordinating the care.   Leisa Lenz, MD [facility] Pager [phone]  If 7PM-7AM, please contact night-coverage [email or web address] Password [email or web address] 09/29/2016, 10:33 AM

## 2016-09-29 NOTE — Clinical Social Work Note (Signed)
SW services continue to follow for stability.  Per MD note- feeding tube to be placed 9/4 for free water flushes to hopefully drive sodium down.  Current plan is for return to [employer] when medically stable per MD.  Butch Penny T. Pauline Good, [place]

## 2016-09-30 ENCOUNTER — Inpatient Hospital Stay (HOSPITAL_COMMUNITY): Payer: Medicare Other

## 2016-09-30 LAB — GLUCOSE, CAPILLARY
GLUCOSE-CAPILLARY: 156 mg/dL — AB (ref 65–99)
GLUCOSE-CAPILLARY: 158 mg/dL — AB (ref 65–99)
GLUCOSE-CAPILLARY: 164 mg/dL — AB (ref 65–99)
GLUCOSE-CAPILLARY: 164 mg/dL — AB (ref 65–99)
GLUCOSE-CAPILLARY: 195 mg/dL — AB (ref 65–99)
Glucose-Capillary: 224 mg/dL — ABNORMAL HIGH (ref 65–99)
Glucose-Capillary: 230 mg/dL — ABNORMAL HIGH (ref 65–99)

## 2016-09-30 LAB — BASIC METABOLIC PANEL
ANION GAP: 13 (ref 5–15)
BUN: 98 mg/dL — ABNORMAL HIGH (ref 6–20)
CALCIUM: 9.6 mg/dL (ref 8.9–10.3)
CHLORIDE: 125 mmol/L — AB (ref 101–111)
CO2: 24 mmol/L (ref 22–32)
CREATININE: 3.34 mg/dL — AB (ref 0.44–1.00)
GFR calc non Af Amer: 12 mL/min — ABNORMAL LOW (ref >60)
GFR, EST AFRICAN AMERICAN: 14 mL/min — AB (ref >60)
Glucose, Bld: 189 mg/dL — ABNORMAL HIGH (ref 65–99)
Potassium: 4.6 mmol/L (ref 3.5–5.1)
SODIUM: 162 mmol/L — AB (ref 135–145)

## 2016-09-30 LAB — URINALYSIS, ROUTINE W REFLEX MICROSCOPIC
GLUCOSE, UA: NEGATIVE mg/dL
KETONES UR: NEGATIVE mg/dL
NITRITE: NEGATIVE
PH: 5 (ref 5.0–8.0)
PROTEIN: 30 mg/dL — AB
Specific Gravity, Urine: 1.017 (ref 1.005–1.030)

## 2016-09-30 LAB — APTT: APTT: 47 s — AB (ref 24–36)

## 2016-09-30 LAB — PREPARE RBC (CROSSMATCH)

## 2016-09-30 LAB — ABO/RH: ABO/RH(D): B POS

## 2016-09-30 MED ORDER — METOPROLOL TARTRATE 5 MG/5ML IV SOLN: 5.0000 mg | Freq: Once | INTRAVENOUS | Status: DC | PRN

## 2016-09-30 MED ORDER — SODIUM CHLORIDE 0.9 % IV BOLUS (SEPSIS)
500.0000 mL | Freq: Once | INTRAVENOUS | Status: AC
  Administered 2016-09-30: 500 mL via INTRAVENOUS

## 2016-09-30 MED ORDER — VITAMINS A & D EX OINT
TOPICAL_OINTMENT | CUTANEOUS | Status: AC
  Administered 2016-09-30: 5
  Filled 2016-09-30: qty 5

## 2016-09-30 MED ORDER — DEXTROSE 5 % IV SOLN
INTRAVENOUS | Status: DC
  Administered 2016-09-30 – 2016-10-03 (×6): via INTRAVENOUS

## 2016-09-30 MED ORDER — SODIUM CHLORIDE 0.9 % IV SOLN
Freq: Once | INTRAVENOUS | Status: AC
  Administered 2016-09-30: 14:00:00 via INTRAVENOUS

## 2016-09-30 NOTE — Progress Notes (Signed)
Patient's Temp 99..5 notified Dr. Charlies Silvers and she stated its okay to start blood. Will continue to assess patient

## 2016-09-30 NOTE — Progress Notes (Signed)
Patient with low urine output and also had 8runs of V-tach Dr. Charlies Silvers notified. Will continue to monitor patient.

## 2016-09-30 NOTE — Progress Notes (Signed)
CRITICAL VALUE ALERT  Critical Value:  Hemoglobin 4.9  Date & Time Notied:09/30/16 0815  Provider Notified: Dr. Charlies Silvers  Orders Received/Actions taken: Order for blood transfusion

## 2016-09-30 NOTE — Progress Notes (Addendum)
Patient ID: Casey [name], female   DOB: 1940/10/13, 76 y.o.   MRN: [id number]  PROGRESS NOTE    Casey [name]  HBZ:[id number] DOB: [birth year] DOA: 09/14/2016  PCP: Gildardo Cranker, DO   Brief Narrative:  76 year old female with history of CVA, seizures, dementia, chronic foley. Pt presented to ED with fevers and myalgias. Pt was found to have HCAP. She subsequently developed respiratory failure and required intubation. She also developed a fib and has required Cardizem drip. Additionally, she developed acute systolic CHF exacerbation requiring diureses. She has had NG tube for feeding but has removed it on her own. She remains high risk of aspiration. PCT following for goals of care. Plan for feeding tube to be placed by IR 9/4. Patient has had blood in stool 9/2. Her hgb dropped from 7.3 to 4.9. We stopped Lovenox. She will receive 2 U PRBC. Also, due to worsening leukocytosis we will obtain UA and CXR.   Assessment & Plan:   Atrial fibrillation with RVR - CHA2DS2-VASc Score8 - On Cardizem drip for HR control, went in to a fib on the night of 8/30 -8/31 - Continue metoprolol - Lovenox because of drop in hemoglobin and blood in the stool  Acute on chronic systolic CHF - EF 10-17% with diffuse hypokinesis - Lasix stopped due to worsening renal function  Essential hypertension - Continue metoprolol  HCAP / Acute respiratory failure with hypoxia - Tracheal aspirate without any growth - Pt has required intubation from 8/17 through 8/22 - Completed 8 days of antibiotics  Leukocytosis - Due to worsening white blood cell count we will obtain urinalysis, urine culture and chest x-ray - As noted above, patient completed 8 days of antibiotics for pneumonia  CKD stage 3 - Cr continues to trend up likely due to lasix - Lasix stopped 09/28/2016 due to worsening renal function. Since then, creatinine has continued to improve  Hypernatremia - Due to dehydration, poor po intake   - Sodium continues to trend up, will check BMP tomorrow morning - Plan for feeding tube 10/02/2016  Severe protein calorie malnutrition/ Dysphagia/ Aspiration risk - Pulled her own NG tube - High risk of aspiration - Appreciate palliative care team following for goals of care  Anemia of chronic disease - Hemoglobin 4. 2 this morning, likely exacerbated by Lovenox - Stopped Lovenox - Keep 2 units of PRBC today  Diabetes mellitus, type 2 with diabetic nephropathy - Continue sliding scale insulin  Dementia without behavioral disturbance  - Stable   History of CVA / Functional quadriplegia  - PEG placement 9/4  Seizure disorder - Continue Keppra  Left arm pain - LE doppler showed no DVT - No reports of pain      DVT prophylaxis: Stop Lovenox and [place] e SCD's due to GI bleed  Code Status: full code  Family Communication: no family at the bedside, spoke with pt daughter 9/1 over the phone  Disposition Plan: plan for PEG placement 9/4   Consultants:   PCT  CCM  Cardio  IR - for PEG placement   Procedures:   ETT 8/17 --> 8/22  LE doppler - no DVT  Antimicrobials:   Zosyn 8/16  Vanco 8/16 -->8/19  Cefepime 8/18 --> 8/23   Subjective: Blood in stool.  Objective: Vitals:   09/30/16 0238 09/30/16 0343 09/30/16 0348 09/30/16 0413  BP: 95/60 (!) 147/49  (!) 151/62  Pulse: 75 75 66 (!) 119  Resp:      Temp:  98.6 F (37 C)  TempSrc:    Axillary  SpO2:    100%  Weight:    55.8 kg (123 lb 0.3 oz)  Height:        Intake/Output Summary (Last 24 hours) at 09/30/16 0843 Last data filed at 09/30/16 0754  Gross per 24 hour  Intake           447.92 ml  Output              450 ml  Net            -2.08 ml   Filed Weights   09/28/16 0446 09/29/16 0422 09/30/16 0413  Weight: 59.7 kg (131 lb 9.8 oz) 55.9 kg (123 lb 3.8 oz) 55.8 kg (123 lb 0.3 oz)    Examination:  General exam: No distress  Respiratory system: diminished breath sounds, no  wheezing  Cardiovascular system: S1 & S2 heard, tachycardic  Gastrointestinal system: (+) BS, non tenderness  Central nervous system: No focal deficits  Extremities: Symmetric 5 x 5 power. Palpable pulses  Skin: warm, dry  Psychiatry: Not restless or agitated   Data Reviewed: I have personally reviewed following labs and imaging studies  CBC:  Recent Labs Lab 09/26/16 0534 09/27/16 0343 09/28/16 0459 09/29/16 0544 09/30/16 0745  WBC 12.1* 14.3* 13.7* 11.8* 20.8*  HGB 9.7* 8.9* 8.3* 7.3* 4.9*  HCT 30.4* 29.2* 27.6* 24.2* 16.2*  MCV 82.6 85.1 85.4 88.0 86.6  PLT 333 311 275 256 295   Basic Metabolic Panel:  Recent Labs Lab 09/26/16 0534 09/27/16 0343 09/28/16 0459 09/29/16 0544  NA 147* 150* 152* 155*  K 3.4* 3.9 4.0 4.0  CL 103 110 113* 119*  CO2 28 30 31 31   GLUCOSE 126* 144* 125* 133*  BUN 37* 43* 47* 49*  CREATININE 1.86* 2.11* 2.13* 1.84*  CALCIUM 10.1 9.9 9.9 9.8  MG  --  1.9  --   --    GFR: Estimated Creatinine Clearance: 21.5 mL/min (A) (by C-G formula based on SCr of 1.84 mg/dL (H)). Liver Function Tests: No results for input(s): AST, ALT, ALKPHOS, BILITOT, PROT, ALBUMIN in the last 168 hours. No results for input(s): LIPASE, AMYLASE in the last 168 hours. No results for input(s): AMMONIA in the last 168 hours. Coagulation Profile:  Recent Labs Lab 09/25/16 0507 09/26/16 0534 09/27/16 0343 09/28/16 0459 09/29/16 0544  INR 1.68 1.84 2.45 2.57 3.16   Cardiac Enzymes: No results for input(s): CKTOTAL, CKMB, CKMBINDEX, TROPONINI in the last 168 hours. BNP (last 3 results) No results for input(s): PROBNP in the last 8760 hours. HbA1C: No results for input(s): HGBA1C in the last 72 hours. CBG:  Recent Labs Lab 09/29/16 1623 09/29/16 2105 09/30/16 0055 09/30/16 0410 09/30/16 0729  GLUCAP 147* 144* 158* 156* 164*   Lipid Profile: No results for input(s): CHOL, HDL, LDLCALC, TRIG, CHOLHDL, LDLDIRECT in the last 72 hours. Thyroid Function  Tests: No results for input(s): TSH, T4TOTAL, FREET4, T3FREE, THYROIDAB in the last 72 hours. Anemia Panel: No results for input(s): VITAMINB12, FOLATE, FERRITIN, TIBC, IRON, RETICCTPCT in the last 72 hours. Urine analysis:    Component Value Date/Time   COLORURINE YELLOW 08/29/2016 1922   APPEARANCEUR CLEAR 08/29/2016 1922   LABSPEC 1.012 08/29/2016 1922   PHURINE 7.0 08/29/2016 1922   GLUCOSEU NEGATIVE 08/29/2016 1922   HGBUR SMALL (A) 08/29/2016 1922   BILIRUBINUR NEGATIVE 08/29/2016 1922   KETONESUR NEGATIVE 08/29/2016 1922   PROTEINUR 100 (A) 08/29/2016 1922   UROBILINOGEN 0.2  02/02/2013 2008   NITRITE NEGATIVE 08/29/2016 1922   LEUKOCYTESUR TRACE (A) 08/29/2016 1922   Sepsis Labs: @LABRCNTIP (procalcitonin:4,lacticidven:4)   )No results found for this or any previous visit (from the past 240 hour(s)).    Radiology Studies: No results found.    Scheduled Meds: . enoxaparin   1 mg/kg Subcutaneous Q24H  . insulin aspart  0-15 Units Subcutaneous Q4H  . mouth rinse  15 mL Mouth Rinse QID  . metoprolol tartrate  5 mg Intravenous Q6H  . pantoprazole sodium  40 mg Per Tube Q24H   Continuous Infusions: . sodium chloride    . diltiazem (CARDIZEM) infusion 12.5 mg/hr (09/30/16 0452)  . levETIRAcetam Stopped (09/29/16 2218)  . [START ON 10/02/2016] vancomycin       LOS: 16 days    Time spent: 25 minutes  Greater than 50% of the time spent on counseling and coordinating the care.   Leisa Lenz, MD [facility] Pager [phone]  If 7PM-7AM, please contact night-coverage [email or web address] Password [facility] 09/30/2016, 8:43 AM

## 2016-09-30 NOTE — Progress Notes (Signed)
CRITICAL VALUE ALERT  Critical Value: Sodium - 162  Date & Time Notied:  09/30/16 1155  Provider Notified: Dr. Charlies Silvers  Orders Received/Actions taken: yes

## 2016-10-01 LAB — GLUCOSE, CAPILLARY
GLUCOSE-CAPILLARY: 227 mg/dL — AB (ref 65–99)
GLUCOSE-CAPILLARY: 247 mg/dL — AB (ref 65–99)
GLUCOSE-CAPILLARY: 259 mg/dL — AB (ref 65–99)
Glucose-Capillary: 235 mg/dL — ABNORMAL HIGH (ref 65–99)
Glucose-Capillary: 239 mg/dL — ABNORMAL HIGH (ref 65–99)
Glucose-Capillary: 283 mg/dL — ABNORMAL HIGH (ref 65–99)

## 2016-10-01 LAB — CBC
HEMATOCRIT: 16.2 % — AB (ref 36.0–46.0)
HEMOGLOBIN: 4.9 g/dL — AB (ref 12.0–15.0)
MCH: 26.2 pg (ref 26.0–34.0)
MCHC: 30.2 g/dL (ref 30.0–36.0)
MCV: 86.6 fL (ref 78.0–100.0)
Platelets: 226 10*3/uL (ref 150–400)
RBC: 1.87 MIL/uL — ABNORMAL LOW (ref 3.87–5.11)
RDW: 17.9 % — ABNORMAL HIGH (ref 11.5–15.5)
WBC: 20.8 10*3/uL — AB (ref 4.0–10.5)

## 2016-10-01 LAB — PROTIME-INR
INR: 4.08
PROTHROMBIN TIME: 39.3 s — AB (ref 11.4–15.2)

## 2016-10-01 LAB — BASIC METABOLIC PANEL
Anion gap: 11 (ref 5–15)
BUN: 111 mg/dL — AB (ref 6–20)
CALCIUM: 9.5 mg/dL (ref 8.9–10.3)
CO2: 21 mmol/L — ABNORMAL LOW (ref 22–32)
CREATININE: 4.41 mg/dL — AB (ref 0.44–1.00)
Chloride: 126 mmol/L — ABNORMAL HIGH (ref 101–111)
GFR calc Af Amer: 10 mL/min — ABNORMAL LOW (ref >60)
GFR, EST NON AFRICAN AMERICAN: 9 mL/min — AB (ref >60)
Glucose, Bld: 233 mg/dL — ABNORMAL HIGH (ref 65–99)
POTASSIUM: 4.8 mmol/L (ref 3.5–5.1)
SODIUM: 158 mmol/L — AB (ref 135–145)

## 2016-10-01 LAB — PREPARE RBC (CROSSMATCH)

## 2016-10-01 MED ORDER — SODIUM CHLORIDE 0.9 % IV SOLN
Freq: Once | INTRAVENOUS | Status: AC
  Administered 2016-10-01: 13:00:00 via INTRAVENOUS

## 2016-10-01 MED ORDER — CIPROFLOXACIN IN D5W 400 MG/200ML IV SOLN
400.0000 mg | INTRAVENOUS | Status: DC
  Administered 2016-10-01: 400 mg via INTRAVENOUS
  Filled 2016-10-01: qty 200

## 2016-10-01 MED ORDER — CEFEPIME HCL 1 G IJ SOLR
500.0000 mg | INTRAMUSCULAR | Status: DC
Start: 2016-10-02 — End: 2016-10-03
  Administered 2016-10-02: 500 mg via INTRAVENOUS
  Filled 2016-10-01 (×3): qty 0.5

## 2016-10-01 NOTE — Progress Notes (Signed)
Nutrition Follow-up  DOCUMENTATION CODES:   Severe malnutrition in context of acute illness/injury  INTERVENTION:  - Following PEG placement, recommend Jevity 1.2 @ 40 mL/hr with 30 mL Prostat BID.  - Will assess free water flush needs following PEG placement. - Recommend 15 mL liquid multivitamin daily per PEG after placement.   NUTRITION DIAGNOSIS:   Malnutrition (severe) related to acute illness, wound healing (abdominal wound) as evidenced by moderate depletion of body fat, severe depletion of muscle mass. -ongoing  GOAL:   Patient will meet greater than or equal to 90% of their needs -unable to meet  MONITOR:   Weight trends, Labs, Skin, I & O's, Other (Comment) (PEG placement and re-start of TF)  ASSESSMENT:   76 year old woman with dementia, bedbound for 3 years, recent admission to [facility] for infected groin wound with staph following pseudoaneurysm repair admitted 8/17 with fevers, shortness of breath and cough, chest x-ray showing multifocal bilateral patchy infiltrates and atrial fibrillation/RVR. She was hypoxic requiring 2 L. She was started on Cardizem drip and became hypotensive and hence this was stopped. Developed respiratory distress and was emergently intubated in the ED  9/3 Order in place for IR with planned PEG placement tomorrow (9/4). Noted RN note from this AM stating pt with critical INR of 4.08; will monitor if PEG placement date to be changed d/t this. When pt had Panda tube, she was receiving Jevity 1.2 @ 40 mL/hr with 30 mL Prostat BID and 100 mL free water every 4 hours. This regimen was providing 1352 kcal, 83 grams of protein, and 1375 mL free water. Weight fluctuating stable since 8/29; will continue to monitor weight trends.  Medications reviewed; sliding scale Novolog. Labs reviewed; CBGs: 227 and 235 mg/dL today, Na: 158 mmol/L, Cl: 126 mmol/L, BUN: 111 mg/dL (trending up), creatinine: 4.41 mg/dL (trending up), GFR: 10 mL/min (trending down).  IVF:  D5 @ 50 mL/hr (204 kcal from dextrose).   8/31 - Pt remains NPO.  - Dr. Domingo Cocking talked with pt's children yesterday evening and they would like pt to remain full code and to proceed with PEG placement.  - No date listed for planned PEG placement. - Will order TF after PEG is placed; no TF order in place at this time. - Current weight is -3 lbs/1.5 kg since assessment on 8/27.  Na: 152 mmol/L, Cl: 113 mmol/L    8/27 - She remains NPO per recommendations following MBS.  - Current weight is -8 lbs/3.3 kg from weight on 8/23.  - Patient pulled Panda tube earlier this AM and Dr. Julianne Rice note from this AM states that pt and family (son and daughter) are agreeable to PEG placement. - Palliative Care following and plan for family meeting tomorrow for [place].     Diet Order:  Diet NPO time specified Diet NPO time specified Except for: Sips with Meds  Skin:   (abdominal and groin incisions)  Last BM:  9/2  Height:   Ht Readings from Last 1 Encounters:  09/14/16 5\' 3"  (1.6 m)    Weight:   Wt Readings from Last 1 Encounters:  10/01/16 122 lb 9.2 oz (55.6 kg)    Ideal Body Weight:  52.3 kg  BMI:  Body mass index is 21.71 kg/m.  Estimated Nutritional Needs:   Kcal:  1250-1450  Protein:  75-85g  Fluid:  1.5L/day  EDUCATION NEEDS:   No education needs identified at this time    Jarome Matin, MS, RD, LDN, [place] Inpatient Clinical Dietitian Pager #  [phone] After hours/weekend pager # [phone]

## 2016-10-01 NOTE — Progress Notes (Signed)
Patient had a critical INR of 4.08, HGB 7.6, urine out 100 cc this shift. 30 cc obtain via bladder scan, abd non distended. Patient remains in no acute episode. ON call made aware.

## 2016-10-01 NOTE — Progress Notes (Signed)
PHARMACY NOTE -  ANTIBIOTIC RENAL DOSE ADJUSTMENT    Pharmacy has assisted with antibiotic renal dose adjustment.  Patient has been initiated on Ciprofloxacin for UTI at 400mg  IV q12. SCr 4.41 gm/dl, estimated CrCl ~10 ml/min Cipro adjusted to 400mg  IV q24  Current dosage is appropriate and need for further dosage adjustment appears unlikely at present. Will sign off at this time.  Please consult if a change in clinical status warrants re-evaluation of dosage.  Thank you,  Minda Ditto PharmD Pager [phone] 10/01/2016, 10:58 AM

## 2016-10-01 NOTE — Progress Notes (Signed)
CRITICAL VALUE ALERT  Critical Value:  INR 4.08  Date & Time Notied:  10/01/16@0500   Provider Notified: Yes  Orders Received/Actions taken: None

## 2016-10-01 NOTE — Progress Notes (Signed)
Patient's daughter asking for ice chips and sandwich to give to patient knowing that the patient is NPO due to aspiration, reinforced reason patient is NPO, Daughter Fredderick Phenix stated "Am taking full responsibility" I reinforced MD has to order for meal for patient to be able to get food and MD notified.

## 2016-10-01 NOTE — Progress Notes (Addendum)
Palliative care progress note   Reason for consult: Goals of care in light of continued decline, GI bleed, renal failure  I saw and examined Casey [name] today.  She appears weaker and remains unable to meaningfully participate in goals of care conversations. I met with her daughter who was at bedside.  We discussed her clinical course since I last saw her, including new developments of GI bleed, infection (suspect UTI), and worsening renal function.  I expressed concern that she has continued to decline despite interventions and this may be an irreversible process.    We also discussed that, while she was recently on and liberated from vent, her condition overall is much worse now.  I recommended that she and her brother discuss what limits may be appropriate as continuing to escalate care or performing heroic measures in the event of respiratory or cardiac arrest are not likely to result in their mother recovering and leaving the hospital in a meaningful way.   She would like to continue with aggressive care, FULL CODE status.  She will discuss further with her brother, Casey [name].  I text paged attending to make them aware of family desire for considering another NG tube as she is not going to be able to get PEG tomorrow as previously planned.  Total time: 40 minutes Greater than 50%  of this time was spent counseling and coordinating care related to the above assessment and plan.  Micheline Rough, MD [place] Team [phone]

## 2016-10-01 NOTE — Progress Notes (Signed)
IR following for possible gastrostomy tube placement which was anticipated tomorrow.  Patient now with elevated WBC to 25K and INR is 4.08. Discussed with RN.  Lovenox has been held.  Patient started on abx this AM for possible UTI.   Paged ordering MD.    Brynda Greathouse, MS RD PA-C 11:18 AM

## 2016-10-01 NOTE — Progress Notes (Signed)
Dr. Charlies Silvers notified of patient's bloody stool. Will continue to assess patient.

## 2016-10-01 NOTE — Progress Notes (Signed)
Patient ID: Casey [name], female   DOB: 03/20/1940, 76 y.o.   MRN: [id number]  PROGRESS NOTE    BYRON TIPPING  WUJ:[id number] DOB: [birth year] DOA: 09/14/2016  PCP: Gildardo Cranker, DO   Brief Narrative:   76 year old female with history of CVA, seizures, dementia, chronic foley. Pt presented to ED with fevers and myalgias. Pt was found to have HCAP. She subsequently developed respiratory failure and required intubation. She also developed a fib and has required Cardizem drip. Additionally, she developed acute systolic CHF exacerbation requiring diureses. She has had NG tube for feeding but has removed it on her own. She remains high risk of aspiration. PCT following for goals of care. Plan for feeding tube to be placed by IR 9/4.  Noted blood in stool 9/2, Lovenox subQ stopped. Pt received 2 U PRBC 9/2. Also, UA showed large leukocytes concerning for UTI so she was started on empiric Rocephin 9/3.   Assessment & Plan:   Atrial fibrillation with RVR - CHA2DS2-VASc Score8 - On Cardizem drip for HR control, went in to a fib on the night of 8/30 -8/31 - Pt is not taking PO so she is still on Cardizem drip - She is also on metoprolol  - Not on AC due to blood in stool (has flexiseeal)  Acute on chronic systolic CHF - EF 95-62% with diffuse hypokinesis - Lasix on hold due to worsening renal failure   Essential hypertension - Continue metoprolol   HCAP / Acute respiratory failure with hypoxia - Tracheal aspirate without any growth - Pt has required intubation from 8/17 through 8/22 - Completed 8 days of antibiotics  Leukocytosis/ UTI - As noted above, patient completed 8 days of antibiotics for pneumonia - CXR without acute findings, 9/2 - UA on 9/3 showed large leukocytes so started rocephin   CKD stage 3 - Cr continues to trend up likely due to lasix and dehydration - Continue IV fluids  - Follow up BMP in am  Hypernatremia - Due to dehydration, poor po intake    - Sodium 158 this am  Severe protein calorie malnutrition/ Dysphagia/ Aspiration risk - Pulled her own NG tube - High risk of aspiration - Palliative following   Anemia of chronic disease - Hemoglobin 4. 2 on 9/2 - S/P 2 U PRBC - Will transfuse 1 more unit today since she will have PEG placed tomorrow   Diabetes mellitus, type 2 with diabetic nephropathy - CBG's: 234, 227, 235  Dementia without behavioral disturbance  - Stable   History of CVA / Functional quadriplegia  - PEG to be placed 9/4  Seizure disorder - Continue Keppra   Left arm pain - LE doppler showed no DVT - No reports of chest pain   DVT prophylaxis: SCD's Code Status: full code  Family Communication: no family at the bedside this am Disposition Plan: PEG to be placed 9/4   Consultants:   PCT  CCM  Cardio  IR - for PEG placement   Procedures:   ETT 8/17 --> 8/22  LE doppler - no DVT  2 U PRBC 9/2  1 U PRBC 9/3  Plan for PEG placement 9/4  Antimicrobials:   Zosyn 8/16  Vanco 8/16 -->8/19  Cefepime 8/18 --> 8/23   Subjective: No overnight events.  Objective: Vitals:   09/30/16 1800 09/30/16 2028 10/01/16 0044 10/01/16 0447  BP: (!) 139/42 126/68 127/61 112/70  Pulse: 89 69 87 76  Resp:  17  17  Temp:  98.2 F (36.8 C)  97.7 F (36.5 C)  TempSrc:  Axillary  Axillary  SpO2:  100%  99%  Weight:    55.6 kg (122 lb 9.2 oz)  Height:        Intake/Output Summary (Last 24 hours) at 10/01/16 1016 Last data filed at 10/01/16 0640  Gross per 24 hour  Intake          1523.91 ml  Output             1100 ml  Net           423.91 ml   Filed Weights   09/29/16 0422 09/30/16 0413 10/01/16 0447  Weight: 55.9 kg (123 lb 3.8 oz) 55.8 kg (123 lb 0.3 oz) 55.6 kg (122 lb 9.2 oz)    Examination:  General exam: Appears calm and comfortable  Respiratory system: No wheezing, coarse sounds  Cardiovascular system: S1 & S2 heard, Rate controlled  Gastrointestinal  system: (+) BS, has flexiseal; appreciate bowel sounds  Central nervous system: Unable to say anything but she is alert and awake Extremities: No swelling, palpable pulses  Skin: warm, dry Psychiatry: Normal mood and behavior   Data Reviewed: I have personally reviewed following labs and imaging studies  CBC:  Recent Labs Lab 09/27/16 0343 09/28/16 0459 09/29/16 0544 09/30/16 0745 10/01/16 0337  WBC 14.3* 13.7* 11.8* 20.8* 25.6*  HGB 8.9* 8.3* 7.3* 4.9* 7.6*  HCT 29.2* 27.6* 24.2* 16.2* 22.1*  MCV 85.1 85.4 88.0 86.6 87.4  PLT 311 275 256 226 782   Basic Metabolic Panel:  Recent Labs Lab 09/27/16 0343 09/28/16 0459 09/29/16 0544 09/30/16 1040 10/01/16 0337  NA 150* 152* 155* 162* 158*  K 3.9 4.0 4.0 4.6 4.8  CL 110 113* 119* 125* 126*  CO2 30 31 31 24  21*  GLUCOSE 144* 125* 133* 189* 233*  BUN 43* 47* 49* 98* 111*  CREATININE 2.11* 2.13* 1.84* 3.34* 4.41*  CALCIUM 9.9 9.9 9.8 9.6 9.5  MG 1.9  --   --   --   --    GFR: Estimated Creatinine Clearance: 9 mL/min (A) (by C-G formula based on SCr of 4.41 mg/dL (H)). Liver Function Tests: No results for input(s): AST, ALT, ALKPHOS, BILITOT, PROT, ALBUMIN in the last 168 hours. No results for input(s): LIPASE, AMYLASE in the last 168 hours. No results for input(s): AMMONIA in the last 168 hours. Coagulation Profile:  Recent Labs Lab 09/26/16 0534 09/27/16 0343 09/28/16 0459 09/29/16 0544 10/01/16 0337  INR 1.84 2.45 2.57 3.16 4.08*   Cardiac Enzymes: No results for input(s): CKTOTAL, CKMB, CKMBINDEX, TROPONINI in the last 168 hours. BNP (last 3 results) No results for input(s): PROBNP in the last 8760 hours. HbA1C: No results for input(s): HGBA1C in the last 72 hours. CBG:  Recent Labs Lab 09/30/16 1614 09/30/16 2158 09/30/16 2352 10/01/16 0422 10/01/16 0800  GLUCAP 195* 230* 224* 227* 235*   Lipid Profile: No results for input(s): CHOL, HDL, LDLCALC, TRIG, CHOLHDL, LDLDIRECT in the last 72  hours. Thyroid Function Tests: No results for input(s): TSH, T4TOTAL, FREET4, T3FREE, THYROIDAB in the last 72 hours. Anemia Panel: No results for input(s): VITAMINB12, FOLATE, FERRITIN, TIBC, IRON, RETICCTPCT in the last 72 hours. Urine analysis:    Component Value Date/Time   COLORURINE YELLOW 09/30/2016 1803   APPEARANCEUR CLOUDY (A) 09/30/2016 1803   LABSPEC 1.017 09/30/2016 1803   PHURINE 5.0 09/30/2016 1803   GLUCOSEU NEGATIVE 09/30/2016 1803   HGBUR SMALL (A) 09/30/2016 1803  BILIRUBINUR SMALL (A) 09/30/2016 1803   KETONESUR NEGATIVE 09/30/2016 1803   PROTEINUR 30 (A) 09/30/2016 1803   UROBILINOGEN 0.2 02/02/2013 2008   NITRITE NEGATIVE 09/30/2016 1803   LEUKOCYTESUR LARGE (A) 09/30/2016 1803   Sepsis Labs: @LABRCNTIP (procalcitonin:4,lacticidven:4)   )No results found for this or any previous visit (from the past 240 hour(s)).    Radiology Studies: Dg Chest Port 1 View Result Date: 09/30/2016 No acute cardiopulmonary disease. Aortic Atherosclerosis (ICD10-170.0)     Scheduled Meds: . insulin aspart  0-15 Units Subcutaneous Q4H  . metoprolol tartrate  5 mg Intravenous Q6H  . pantoprazole sod  40 mg Per Tube Q24H   Continuous Infusions: . dextrose 50 mL/hr at 09/30/16 2106  . diltiazem (CARDIZEM) infusion 8 mg/hr (09/30/16 2101)  . levETIRAcetam Stopped (09/30/16 2255)  . [START ON 10/02/2016] vancomycin       LOS: 17 days    Time spent: 25 minutes  Greater than 50% of the time spent on counseling and coordinating the care.   Leisa Lenz, MD [facility] Pager [phone]  If 7PM-7AM, please contact night-coverage [email or web address] Password [email or web address] 10/01/2016, 10:16 AM

## 2016-10-02 DIAGNOSIS — D689 Coagulation defect, unspecified: Secondary | ICD-10-CM

## 2016-10-02 DIAGNOSIS — K922 Gastrointestinal hemorrhage, unspecified: Secondary | ICD-10-CM

## 2016-10-02 DIAGNOSIS — D649 Anemia, unspecified: Secondary | ICD-10-CM

## 2016-10-02 LAB — CBC
HCT: 22.1 % — ABNORMAL LOW (ref 36.0–46.0)
HCT: 17.8 % — ABNORMAL LOW (ref 36.0–46.0)
Hemoglobin: 7.6 g/dL — ABNORMAL LOW (ref 12.0–15.0)
Hemoglobin: 5.9 g/dL — CL (ref 12.0–15.0)
MCH: 30 pg (ref 26.0–34.0)
MCH: 27.4 pg (ref 26.0–34.0)
MCHC: 34.4 g/dL (ref 30.0–36.0)
MCHC: 33.1 g/dL (ref 30.0–36.0)
MCV: 87.4 fL (ref 78.0–100.0)
MCV: 82.8 fL (ref 78.0–100.0)
PLATELETS: 171 10*3/uL (ref 150–400)
PLATELETS: 110 10*3/uL — AB (ref 150–400)
RBC: 2.53 MIL/uL — AB (ref 3.87–5.11)
RBC: 2.15 MIL/uL — ABNORMAL LOW (ref 3.87–5.11)
RDW: 16.2 % — AB (ref 11.5–15.5)
RDW: 18.1 % — AB (ref 11.5–15.5)
WBC: 25.6 10*3/uL — ABNORMAL HIGH (ref 4.0–10.5)
WBC: 20.7 10*3/uL — ABNORMAL HIGH (ref 4.0–10.5)

## 2016-10-02 LAB — URINE CULTURE

## 2016-10-02 LAB — BASIC METABOLIC PANEL
Anion gap: 9 (ref 5–15)
BUN: 117 mg/dL — AB (ref 6–20)
CALCIUM: 9.1 mg/dL (ref 8.9–10.3)
CO2: 21 mmol/L — ABNORMAL LOW (ref 22–32)
Chloride: 121 mmol/L — ABNORMAL HIGH (ref 101–111)
Creatinine, Ser: 4.39 mg/dL — ABNORMAL HIGH (ref 0.44–1.00)
GFR calc Af Amer: 10 mL/min — ABNORMAL LOW (ref >60)
GFR, EST NON AFRICAN AMERICAN: 9 mL/min — AB (ref >60)
GLUCOSE: 278 mg/dL — AB (ref 65–99)
POTASSIUM: 3.9 mmol/L (ref 3.5–5.1)
Sodium: 151 mmol/L — ABNORMAL HIGH (ref 135–145)

## 2016-10-02 LAB — GLUCOSE, CAPILLARY
GLUCOSE-CAPILLARY: 157 mg/dL — AB (ref 65–99)
GLUCOSE-CAPILLARY: 169 mg/dL — AB (ref 65–99)
Glucose-Capillary: 267 mg/dL — ABNORMAL HIGH (ref 65–99)
Glucose-Capillary: 232 mg/dL — ABNORMAL HIGH (ref 65–99)
Glucose-Capillary: 253 mg/dL — ABNORMAL HIGH (ref 65–99)
Glucose-Capillary: 158 mg/dL — ABNORMAL HIGH (ref 65–99)

## 2016-10-02 LAB — PROTIME-INR
INR: 4.03
Prothrombin Time: 38.9 seconds — ABNORMAL HIGH (ref 11.4–15.2)

## 2016-10-02 LAB — TROPONIN I: TROPONIN I: 0.04 ng/mL — AB (ref <0.03)

## 2016-10-02 LAB — PREPARE RBC (CROSSMATCH)

## 2016-10-02 MED ORDER — VITAMIN K1 10 MG/ML IJ SOLN
10.0000 mg | Freq: Once | INTRAVENOUS | Status: AC
  Administered 2016-10-02: 10 mg via INTRAVENOUS
  Filled 2016-10-02: qty 1

## 2016-10-02 MED ORDER — SODIUM CHLORIDE 0.9 % IV SOLN: Freq: Once | INTRAVENOUS | Status: DC

## 2016-10-02 MED ORDER — PANTOPRAZOLE SODIUM 40 MG IV SOLR
40.0000 mg | Freq: Two times a day (BID) | INTRAVENOUS | Status: DC
  Administered 2016-10-02 – 2016-10-05 (×7): 40 mg via INTRAVENOUS
  Filled 2016-10-02 (×7): qty 40

## 2016-10-02 NOTE — Progress Notes (Signed)
PT Cancellation Note  Patient Details Name: Casey [name] MRN: [id number] DOB: [birth year]   Cancelled Treatment:    Reason Eval/Treat Not Completed: PT screened, no needs identified, will sign off. Patient screened on 8/24- long term care resident,rquires total care, mechanical lift for OOB per previous encounters.    Marcelino Freestone PT [phone]  10/02/2016, 12:28 PM

## 2016-10-02 NOTE — Progress Notes (Signed)
Pt is still continuing to have blood come out through her rectum tube, it seems to be getting darker and darker. Will continue to monitor patient and update on call on patients status.  Carmela Hurt, RN

## 2016-10-02 NOTE — Progress Notes (Signed)
Notified Dr. Charlies Silvers of follow up HBG of 7.8 after 2 units of PRBC'S.  The patient is also having NSR with bigeminey PVC'S.  Continues to have drainage from flexiseal that is bloody.  Continue to monitor patient closely.  Casey Bohman Roselie Awkward RN

## 2016-10-02 NOTE — Progress Notes (Signed)
CRITICAL VALUE ALERT  Critical Value:  WBC 0.85, platelet 16,000  Date & Time Notied:  10/02/2016  Provider Notified: Dr. Posey Pronto  Orders Received/Actions taken: no new orders currently

## 2016-10-02 NOTE — Progress Notes (Signed)
Patient ID: Casey [name], female   DOB: 1940-09-21, 76 y.o.   MRN: [id number] Pt's latest labs today include PT 38.9/INR 4.03, hgb 5.9, WBC 20.7. G tube on hold until parameters improve. Will cont to monitor.

## 2016-10-02 NOTE — Progress Notes (Addendum)
Patient ID: Casey [name], female   DOB: 11/28/1940, 76 y.o.   MRN: [id number]  PROGRESS NOTE    Casey [name]  [id number]:[id number] DOB: [birth year] DOA: 09/14/2016  PCP: Gildardo Cranker, DO   Brief Narrative:   76 year old female with history of CVA, seizures, dementia, chronic foley. Pt presented to ED with fevers and myalgias. Pt was found to have HCAP. She subsequently developed respiratory failure and required intubation. She also developed a fib and has required Cardizem drip. Additionally, she developed acute systolic CHF exacerbation requiring diureses. She has had NG tube for feeding but has removed it on her own. She remains high risk of aspiration. PCT following for goals of care. Plan for feeding tube to be placed by IR 9/4.  Noted blood in stool 9/2, Lovenox subQ stopped. Pt received 2 U PRBC 9/2. Also, UA showed large leukocytes concerning for UTI so she was started on empiric cefepime 9/3.  Hospital course remains complicated with ongoing blood loss anemia, blood in the stool despite the fact that we stop Lovenox subcutaneous. In addition patient continues to have renal insufficiency with creatinine of 4.4. Her INR is still elevated at 4.03. Interventional radiology is holding off on PEG tube placement in the light of UTI and all these issues. Palliative care is assisting with goals of care. I spoke with the family as well but they're adamant on continuing full code.   Assessment & Plan:   Atrial fibrillation with RVR - CHA2DS2-VASc Score8 - On Cardizem drip for HR control, went in to a fib on the night of 8/30 -8/31 - Patient is still on Cardizem drip - Continue metoprolol - Not on anticoagulation due to acute blood loss anemia  Acute on chronic systolic CHF - EF 57-84% with diffuse hypokinesis - Lasix on hold due to worsening renal failure   Essential hypertension - Continue metoprolol   HCAP / Acute respiratory failure with hypoxia - Tracheal aspirate without  any growth - Pt has required intubation from 8/17 through 8/22 - Completed 8 days of antibiotics  Leukocytosis/ UTI - As noted above, patient completed 8 days of antibiotics for pneumonia - CXR without acute findings on 9/2 - Urinalysis on 10/01/2016 showed large leukocytes was started empiric cefepime  CKD stage 3 - Cr continues to trend up likely due to lasix and dehydration - Creatinine is 4.03 this morning, continue to monitor daily BMP  Hypernatremia - Due to dehydration, poor po intake  - Sodium slowly improving, 151 this morning  Severe protein calorie malnutrition/ Dysphagia/ Aspiration risk - Pulled her own NG tube - High risk of aspiration - Palliative continues to follow for goals of care - PEG tube was supposed to be placed today but because of acute blood loss anemia and acute infection interventional radiology is holding off on placement for now  Anemia of chronic disease - Hemoglobin 4. 2 on 9/2 - S/P 2 U PRBC transfusion 09/30/2016, 1 unit of PRBC transfusion 10/01/2016  - Hemoglobin 5.9 this morning so we will transfuse 2 more units of PRBC today  - Called GI for input - Change Protonix from once a day to twice a day  Diabetes mellitus, type 2 with diabetic nephropathy - Continue sliding scale insulin  Dementia without behavioral disturbance  - Stable   Seizure disorder - Continue Keppra  Left arm pain - LE doppler showed no DVT - No reports of chest pain   DVT prophylaxis: SCD's Code Status: full code  Family Communication:  no family at the bedside this am, spoke with pt daughter today, she will think about DNR code status Disposition Plan: Needs blood transfusion today, called GI, not yet stable for discharge; transfer to SDU    Consultants:   PCT  CCM  Cardio  IR - for PEG placement   GI  Procedures:   ETT 8/17 --> 8/22  LE doppler - no DVT  2 U PRBC 9/2  1 U PRBC 9/3  2 units PRBCs 10/02/2016  Antimicrobials:     Zosyn 8/16  Vanco 8/16 -->8/19  Cefepime 8/18 --> 8/23; resumed 9/3 -->   Subjective: No overnight events.  Objective: Vitals:   10/02/16 0440 10/02/16 0443 10/02/16 0649 10/02/16 0705  BP: (!) 126/59  (!) 138/47 (!) 118/34  Pulse: 79  80 80  Resp: 18  18 18   Temp: 97.9 F (36.6 C)  97.8 F (36.6 C) 97.9 F (36.6 C)  TempSrc: Axillary  Axillary Axillary  SpO2: 100%  98% 100%  Weight:  59.5 kg (131 lb 2.8 oz)    Height:        Intake/Output Summary (Last 24 hours) at 10/02/16 0901 Last data filed at 10/02/16 0200  Gross per 24 hour  Intake          2274.35 ml  Output              100 ml  Net          2174.35 ml   Filed Weights   09/30/16 0413 10/01/16 0447 10/02/16 0443  Weight: 55.8 kg (123 lb 0.3 oz) 55.6 kg (122 lb 9.2 oz) 59.5 kg (131 lb 2.8 oz)    Physical Exam  Constitutional: Appears ill CVS: RRR, S1/S2 + Pulmonary: diminished breath sounds, no wheezing  Abdominal: Soft. BS +,  no distension, Musculoskeletal: Normal range of motion. No edema and no tenderness.  Neuro: Alert. Not speaking  Skin: Skin is warm and dry.  Psychiatric: Not agitated or restless     Data Reviewed: I have personally reviewed following labs and imaging studies  CBC:  Recent Labs Lab 09/28/16 0459 09/29/16 0544 09/30/16 0745 10/01/16 0337 10/02/16 0352  WBC 13.7* 11.8* 20.8* 25.6* 20.7*  HGB 8.3* 7.3* 4.9* 7.6* 5.9*  HCT 27.6* 24.2* 16.2* 22.1* 17.8*  MCV 85.4 88.0 86.6 87.4 82.8  PLT 275 256 226 171 458*   Basic Metabolic Panel:  Recent Labs Lab 09/27/16 0343 09/28/16 0459 09/29/16 0544 09/30/16 1040 10/01/16 0337 10/02/16 0352  NA 150* 152* 155* 162* 158* 151*  K 3.9 4.0 4.0 4.6 4.8 3.9  CL 110 113* 119* 125* 126* 121*  CO2 30 31 31 24  21* 21*  GLUCOSE 144* 125* 133* 189* 233* 278*  BUN 43* 47* 49* 98* 111* 117*  CREATININE 2.11* 2.13* 1.84* 3.34* 4.41* 4.39*  CALCIUM 9.9 9.9 9.8 9.6 9.5 9.1  MG 1.9  --   --   --   --   --    GFR: Estimated  Creatinine Clearance: 9 mL/min (A) (by C-G formula based on SCr of 4.39 mg/dL (H)). Liver Function Tests: No results for input(s): AST, ALT, ALKPHOS, BILITOT, PROT, ALBUMIN in the last 168 hours. No results for input(s): LIPASE, AMYLASE in the last 168 hours. No results for input(s): AMMONIA in the last 168 hours. Coagulation Profile:  Recent Labs Lab 09/27/16 0343 09/28/16 0459 09/29/16 0544 10/01/16 0337 10/02/16 0352  INR 2.45 2.57 3.16 4.08* 4.03*   Cardiac Enzymes: No results for input(s): CKTOTAL,  CKMB, CKMBINDEX, TROPONINI in the last 168 hours. BNP (last 3 results) No results for input(s): PROBNP in the last 8760 hours. HbA1C: No results for input(s): HGBA1C in the last 72 hours. CBG:  Recent Labs Lab 10/01/16 1715 10/01/16 2036 10/01/16 2343 10/02/16 0438 10/02/16 0739  GLUCAP 283* 247* 259* 267* 232*   Lipid Profile: No results for input(s): CHOL, HDL, LDLCALC, TRIG, CHOLHDL, LDLDIRECT in the last 72 hours. Thyroid Function Tests: No results for input(s): TSH, T4TOTAL, FREET4, T3FREE, THYROIDAB in the last 72 hours. Anemia Panel: No results for input(s): VITAMINB12, FOLATE, FERRITIN, TIBC, IRON, RETICCTPCT in the last 72 hours. Urine analysis:    Component Value Date/Time   COLORURINE YELLOW 09/30/2016 1803   APPEARANCEUR CLOUDY (A) 09/30/2016 1803   LABSPEC 1.017 09/30/2016 1803   PHURINE 5.0 09/30/2016 1803   GLUCOSEU NEGATIVE 09/30/2016 1803   HGBUR SMALL (A) 09/30/2016 1803   BILIRUBINUR SMALL (A) 09/30/2016 1803   KETONESUR NEGATIVE 09/30/2016 1803   PROTEINUR 30 (A) 09/30/2016 1803   UROBILINOGEN 0.2 02/02/2013 2008   NITRITE NEGATIVE 09/30/2016 1803   LEUKOCYTESUR LARGE (A) 09/30/2016 1803   Sepsis Labs: @LABRCNTIP (procalcitonin:4,lacticidven:4)   )No results found for this or any previous visit (from the past 240 hour(s)).    Radiology Studies: Dg Chest Port 1 View Result Date: 09/30/2016 No acute cardiopulmonary disease. Aortic  Atherosclerosis (ICD10-170.0)     Scheduled Meds: . insulin aspart  0-15 Units Subcutaneous Q4H  . metoprolol tartrate  5 mg Intravenous Q6H  . pantoprazole sod  40 mg Per Tube Q24H   Continuous Infusions: . sodium chloride    . sodium chloride    . dextrose 75 mL/hr at 10/02/16 0020  . diltiazem (CARDIZEM) infusion 3 mg/hr (10/02/16 0838)  . levETIRAcetam Stopped (10/01/16 2245)  . vancomycin 1,000 mg (10/02/16 0805)     LOS: 18 days    Time spent: 25 minutes  Greater than 50% of the time spent on counseling and coordinating the care.   Leisa Lenz, MD [facility] Pager [phone]  If 7PM-7AM, please contact night-coverage [email or web address] Password [email or web address] 10/02/2016, 9:01 AM

## 2016-10-02 NOTE — Progress Notes (Signed)
We were consulted regarding this patient, but Dr. Benson Norway has previously made notes during this admission, he will be re-consulted by hospitalist.   Ellouise Newer, PA-C [facility]

## 2016-10-02 NOTE — Consult Note (Signed)
[facility] Consult: 12:11 PM 10/02/2016  LOS: 18 days    Referring Provider: DR Charlies Silvers  Primary Care Physician:  Gildardo Cranker, DO Primary Gastroenterologist:  Dr. Marlynn Perking, unassigned     Reason for Consultation:  Bloody stool/hematochezia.     HPI: Casey [name] is a 76 y.o. female.  PMH dementia. Stroke. Chronic indwelling Foley.  DM 2.  CKD stage 3.    11/2003 colonoscopy with polypectomy, Dr. Teena Irani for Heme + stool.  Removed cecal and transverse colon polyps. Path: inflammatory pseudopolyps. Abdominal pelvic CT with contrast on 08/29/16 to evaluate painful mass in the left groin showed pseudoaneurysm arising from left aspect of the left femorofemoral found bypass. This finding suggested graft failure  Atherosclerosis extensively in the intra-abdominal aorta and branch vessels. Right UVJ stone with mild hydroureteronephrosis.  Stool in rectal vault suggestive of constipation.  S/p 08/30/16 repair of left femoral pseudoaneurysm, revision with thrombectomies of bilateral fem-fem bypass grafts, left femoral endarterectomy.  Required 2 U PRBC during the admission for hemoglobin of 7.3.  Discharged on 09/02/16. Readmitted 8/10-8/12/18 from SNF with A. Fib/RVR. She previously had controlled rate. She was not started on anticoagulation due to her high fall risk.. Because of discharge from the left groin wound, wound VAC was placed.  Readmitted 09/04/16 with HCAP with respiratory failure required intubation.  A. Fib/RVR with acute CHF. Performed poorly on swallowing evaluation and has high risk for aspiration.  NG tube placed for feeding but patient pulled it out.  Plan is for IR to place a gastric feeding tube.  Since her admission patient has been anemic with hemoglobins ranging 7 to 10.   Bloody stool began 9/2  at which point subcutaneous Lovenox discontinued.  IV heparin discontinued on 8/29.   Hemoglobin dropped from 7.3>> 4.9 on 9/2, received 2U PRBC.  The following day hemoglobin was 7.6, however today, 24 hours later, Hgb has again dropped to 5.9.  Today the platelets are also abnormal at 110.  She was not previously thrombocytopenic. PT/INR also abnormal and rising. Her LFTs were last checked 8/18 at which point they were normal though the albumin was low at 2.6. Patient has acutely worsening renal function over the last several days. Cefepime started 9/3 for UTI. Flexiseal recta tube has been in place since 8/25. Because she was leaking the bloody effluent around the flexiseal, it was replaced last night. She continues to have liquid maroon bloody stool.  She is on twice daily IV Protonix. Palliative care has been following the patient and has had discussions with the family who opted for aggressive care and full code.  Pt is unable to participate meaningfully in decision making.  Patient has not been seen during this admission by vascular surgery    Past Medical History:  Diagnosis Date  . Abdominal or pelvic swelling, mass, or lump, left upper quadrant   . CHF (congestive heart failure) ([place])   . Chronic atrial fibrillation ([place])   . Chronic kidney disease   . Congestive heart failure, unspecified   . Coronary atherosclerosis of  unspecified type of vessel, native or graft   . Diabetes mellitus   . Edema   . GERD (gastroesophageal reflux disease)   . Gout, unspecified   . Hypercholesterolemia   . Hypertension   . 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  . Peripheral vascular disease, unspecified ([place])   . Seizures ([place])   . Sinus of Valsalva aneurysm    a. By 2D echo 05/2011.  . Stroke ([place])   . Unspecified hereditary and idiopathic  peripheral neuropathy   . Unspecified urinary incontinence   . Vascular dementia, uncomplicated     Past Surgical History:  Procedure Laterality Date  . AORTOGRAM  09/01/2008   Abd w/ bilateral lower extremity runoff arteriography  . APPLICATION OF WOUND VAC Left 08/30/2016   Procedure: APPLICATION OF WOUND VAC LEFT GROIN;  Surgeon: Serafina Mitchell, MD;  Location: [facility];  Service: Vascular;  Laterality: Left;  . ENDARTERECTOMY FEMORAL Left 08/30/2016   Procedure: LEFT COMMON FEMORAL ARTERY ENDARTERECTOMY;  Surgeon: Serafina Mitchell, MD;  Location: [place];  Service: Vascular;  Laterality: Left;  . FALSE ANEURYSM REPAIR  12/06/2011   Procedure: REPAIR FALSE ANEURYSM;  Surgeon: Angelia Mould, MD;  Location[facility] OR;  Service: Vascular;  Laterality: Left;  Repair of left femoral Artery pseudoaneurysm  . FALSE ANEURYSM REPAIR Left 08/30/2016   Procedure: REPAIR PSEUDOANEURYSM LEFT GROIN;  Surgeon: Serafina Mitchell, MD;  Location: [place];  Service: Vascular;  Laterality: Left;  . FEMORAL ARTERY EXPLORATION  12/06/2011   Procedure: FEMORAL ARTERY EXPLORATION;  Surgeon: Angelia Mould, MD;  Location: [facility] OR;  Service: Vascular;  Laterality: N/A;  Exploration of large pseudoaneurysm left side of fem-fem bypass graft   . femoral to femoral bypass graft     . FEMORAL-FEMORAL BYPASS GRAFT  12/06/2011   Procedure: BYPASS GRAFT FEMORAL-FEMORAL ARTERY;  Surgeon: Angelia Mould, MD;  Location: [facility] OR;  Service: Vascular;  Laterality: Bilateral;  Revision of left to right Femoral-Femoral bypass graft  . FEMORAL-FEMORAL BYPASS GRAFT Left 08/30/2016   Procedure: REDO RIGHT TO LEFT FEMORAL-FEMORAL ARTERY BYPASS GRAFT;  Surgeon: Serafina Mitchell, MD;  Location: [place];  Service: Vascular;  Laterality: Left;  . FEMORAL-POPLITEAL BYPASS GRAFT Left 05/2002   lower extremity femoral to below knee w/ non-versed greater saphenous vein  . FEMORAL-POPLITEAL BYPASS GRAFT Left 11/2002  . FEMORAL-POPLITEAL BYPASS GRAFT  Left 08/30/2016   Procedure: REVISION OF LEFT FEMORAL-POPLITEAL ARTERY BYPASS GRAFT;  Surgeon: Serafina Mitchell, MD;  Location: [place];  Service: Vascular;  Laterality: Left;  . THROMBECTOMY FEMORAL ARTERY Left 08/30/2016   Procedure: THROMBECTOMY OF FEMORAL-FEMORAL ARTERY BYPASS GRAFT AND LEFT FEMORAL TO LEFT POPLITEAL BYPASS GRAFT;  Surgeon: Serafina Mitchell, MD;  Location: [place];  Service: Vascular;  Laterality: Left;  . TUBAL LIGATION Bilateral     Prior to Admission medications   Medication Sig Start Date End Date Taking? Authorizing Provider  acetaminophen (TYLENOL) 325 MG tablet Take 2 tablets (650 mg total) by mouth every 6 (six) hours as needed for mild pain (or Fever >/= 101). 09/02/16  Yes Osei-Bonsu, Iona Beard, MD  allopurinol (ZYLOPRIM) 100 MG tablet Take 100 mg by mouth daily.   Yes [provider]  aspirin 81 MG tablet Take 81 mg by mouth daily.   Yes [provider]  atorvastatin (LIPITOR) 10 MG tablet  Take 10 mg by mouth at bedtime.    Yes [provider]  bisacodyl (BISCOLAX) 10 MG suppository Place 10 mg rectally as needed for moderate constipation.   Yes [provider]  CETYLPYRIDINIUM CHLORIDE MT Give 15 ml by mouth two times a day for oral irritation   Yes [provider]  donepezil (ARICEPT) 10 MG tablet Take 1 tablet (10 mg total) by mouth at bedtime. 02/07/13  Yes Isaac Bliss, Rayford Halsted, MD  guaiFENesin-dextromethorphan (ROBITUSSIN DM) 100-10 MG/5ML syrup Take 15 mLs by mouth every 4 (four) hours as needed for cough. 09/02/16  Yes Osei-Bonsu, Iona Beard, MD  levETIRAcetam (KEPPRA) 250 MG tablet Take 250 mg by mouth 2 (two) times daily.   Yes [provider]  metoprolol tartrate (LOPRESSOR) 25 MG tablet Give 2 tablets (32m) by mouth in the morning and 1 tablet by mouth at bedtime   Yes [provider]  mirtazapine (REMERON) 7.5 MG tablet Take 7.5 mg by mouth at bedtime.   Yes [provider]  oxyCODONE (OXY  IR/ROXICODONE) 5 MG immediate release tablet Take 1 tablet (5 mg total) by mouth every 6 (six) hours as needed for severe pain. 09/09/16  Yes Dhungel, Nishant, MD  pantoprazole (PROTONIX) 40 MG tablet Take 40 mg by mouth daily.   Yes [provider]  sertraline (ZOLOFT) 50 MG tablet Take 50 mg by mouth at bedtime.    Yes [provider]    Scheduled Meds: . bacitracin  1 application Topical BID  . ceFEPime (MAXIPIME) IV  500 mg Intravenous Q24H  . chlorhexidine gluconate (MEDLINE KIT)  15 mL Mouth Rinse BID  . insulin aspart  0-15 Units Subcutaneous Q4H  . mouth rinse  15 mL Mouth Rinse QID  . metoprolol tartrate  5 mg Intravenous Q6H  . pantoprazole (PROTONIX) IV  40 mg Intravenous Q12H  . sodium chloride flush  10-40 mL Intracatheter Q12H   Infusions: . sodium chloride    . sodium chloride    . dextrose 75 mL/hr at 10/02/16 0020  . diltiazem (CARDIZEM) infusion 3 mg/hr (10/02/16 0838)  . levETIRAcetam Stopped (10/02/16 1017)   PRN Meds: acetaminophen, bisacodyl, ipratropium-albuterol, metoprolol tartrate, morphine injection, ondansetron (ZOFRAN) IV, sodium chloride flush   Allergies as of 09/14/2016 - Review Complete 09/14/2016  Allergen Reaction Noted  . Penicillins Other (See Comments) 05/22/2010    Family History  Problem Relation Age of Onset  . Cancer Mother        Cervical  . Stroke Mother   . Diabetes Mother   . Stroke Father   . Heart disease Father     Social History   Social History  . Marital status: Widowed    Spouse name: N/A  . Number of children: N/A  . Years of education: N/A   Occupational History  . Not on file.   Social History Main Topics  . Smoking status: Former Smoker    Types: Cigarettes    Quit date: 01/29/2001  . Smokeless tobacco: Never Used  . Alcohol use No  . Drug use: No  . Sexual activity: No   Other Topics Concern  . Not on file   Social History Narrative  . No narrative on file    REVIEW OF  SYSTEMS: Patient is unable to provide any review of systems, she is moaning. Not saying much in the way of actual words. Earlier today she told the nurse that she hurt all over   PHYSICAL EXAM: Vital signs in  last 24 hours: Vitals:   10/02/16 1013 10/02/16 1045  BP: (!) 114/51 (!) 119/94  Pulse: 88 72  Resp: 18 18  Temp: 97.9 F (36.6 C) (!) 97.4 F (36.3 C)  SpO2: 95%    Wt Readings from Last 3 Encounters:  10/02/16 59.5 kg (131 lb 2.8 oz)  09/13/16 56 kg (123 lb 8 oz)  09/10/16 56 kg (123 lb 8 oz)    General: Frail, malnourished AAF. She is moaning. Head:  Skeletal faces. No asymmetry.  Eyes:  No scleral icterus, conjunctiva pale. Ears:  Seems to hear without problems  Nose:  No discharge or congestion Mouth:  Moist, no lesions. There is thick yellow secretions accumulated in the oral cavity. No blood in the mouth. Neck:  No mass, no JVD, no thyromegaly. Lungs:  Clear bilaterally. No labored breathing or cough. Heart:  Irregularly irregular, rate in the seventies. Abdomen:  Nontender. Soft. No HSM. Surgical incision from recent aneurysm/endarterectomy surgery in the lower abdomen with palpable healing ridge beneath the incision which is clean, dry, intact. Incision scar in the left groin also CDI. There is no visible hematoma at incision sites..   Rectal: The flexiseal in place, DRE exam not performed. In the liquid stool in the flexiseal   Musc/Skeltl:  No gross joint swelling or redness Extremities:  No CCE.  Neurologic:  Patient moaning. She does follow some commands.  Moves all 4 limbs, strength not tested. Skin:  Pressure dressings applied to both heels. Tattoos:  None seen Psych:  Delirious.  Intake/Output from previous day: 09/03 0701 - 09/04 0700 In: 2274.4 [I.V.:1569.4; Blood:300; IV Piggyback:405] Out: 100 [Urine:100] Intake/Output this shift: Total I/O In: 315 [Blood:315] Out: 350 [Urine:350]  LAB RESULTS:  Recent Labs  09/30/16 0745 10/01/16 0337  10/02/16 0352  WBC 20.8* 25.6* 20.7*  HGB 4.9* 7.6* 5.9*  HCT 16.2* 22.1* 17.8*  PLT 226 171 110*   BMET Lab Results  Component Value Date   NA 151 (H) 10/02/2016   NA 158 (H) 10/01/2016   NA 162 (HH) 09/30/2016   K 3.9 10/02/2016   K 4.8 10/01/2016   K 4.6 09/30/2016   CL 121 (H) 10/02/2016   CL 126 (H) 10/01/2016   CL 125 (H) 09/30/2016   CO2 21 (L) 10/02/2016   CO2 21 (L) 10/01/2016   CO2 24 09/30/2016   GLUCOSE 278 (H) 10/02/2016   GLUCOSE 233 (H) 10/01/2016   GLUCOSE 189 (H) 09/30/2016   BUN 117 (H) 10/02/2016   BUN 111 (H) 10/01/2016   BUN 98 (H) 09/30/2016   CREATININE 4.39 (H) 10/02/2016   CREATININE 4.41 (H) 10/01/2016   CREATININE 3.34 (H) 09/30/2016   CALCIUM 9.1 10/02/2016   CALCIUM 9.5 10/01/2016   CALCIUM 9.6 09/30/2016   LFT No results for input(s): PROT, ALBUMIN, AST, ALT, ALKPHOS, BILITOT, BILIDIR, IBILI in the last 72 hours. PT/INR Lab Results  Component Value Date   INR 4.03 (HH) 10/02/2016   INR 4.08 (HH) 10/01/2016   INR 3.16 09/29/2016   Hepatitis Panel No results for input(s): HEPBSAG, HCVAB, HEPAIGM, HEPBIGM in the last 72 hours. C-Diff No components found for: CDIFF Lipase  No results found for: LIPASE  Drugs of Abuse     Component Value Date/Time   LABOPIA NONE DETECTED 02/02/2013 2008   COCAINSCRNUR NONE DETECTED 02/02/2013 2008   LABBENZ NONE DETECTED 02/02/2013 2008   AMPHETMU NONE DETECTED 02/02/2013 2008   THCU NONE DETECTED 02/02/2013 2008   [place] DETECTED 02/02/2013 2008  RADIOLOGY STUDIES: No results found.   IMPRESSION:   *  GI bleed. Question rapid upper GI bleed versus lower GI bleed. Possibilities include peptic ulcers and pressure ulcers from the flexiseal catheter.    *  Acute anemia on top of anemia of chronic disease.  Received 2 U PRBCs two days ago. 3 units today, the 3rd is currently transfusing   *  Coagulopathy.  IV heparin discontinued 8/29, Lovenox discontinued 9/2.    *   Thrombocytopenia.  *  AKI.  CKD stage 3 at baseline.    *  08/30/16 repair of left femoral pseudoaneurysm, revision with thrombectomies of bilateral fem-fem bypass grafts, left femoral endarterectomy.   Currently there is no obvious hematoma at the surgical sites.    *  UTI on antibiotic.   *  Protein malnutrition.  Dysphagia. High risk for aspiration. Patient pulled out NG tube. Plan for IR to place gastrostomy tube is currently on hold.  Patient does not have a feeding tube in place.  *  A. Fib with RVR. Chads Vasc score 8. No anticoagulation now due to GI bleed, anemia.  Previously not felt to be an anticoagulation candidate due to high fall risk. CHF EF 25 to 30%.    *  HCAP with resp failure.  Vent 8/17-8/22.     PLAN:     *  Given the coagulopathy, would like to know what her LFTs are doing currently: check CMET.    *  Leave on BID IV Protonix vs switch to gtt?   *  Coagulopathy needs to be corrected before embarking on any endoscopic workup.  I have ordered IV vitamin K.    *  Should we remove Flexiseal?     Azucena Freed  10/02/2016, 12:11 PM Pager: [phone]      Attending physician's note   I have taken a history, examined the patient and reviewed the chart. I agree with the Advanced Practitioner's note, impression and recommendations. Patient is able to answer simple questions but unable to provide detailed history and unclear if she is able to comprehend. No family in the room. Reviewed chart in detail. Flexiseal with rectal tube draining maroon liquid stool. Cachetic appearing, not oriented to time or place.  Overall poor prognosis.  Recommend holding anticoagulation, please discuss with cardiology, palliative care and family ? if patient should stay off anticoagulation long term given her risk for bleeding Differential includes ischemic bowel vs rectal ulcer vs peptic ulcer disease Poor candidate for  Anaesthesia or endoscopic evaluation Continue supportive care,  and readdress goals of care if family is amenable DC Flexiseal, it could be contributing to rectal ulceration and trauma Please call with any questions  K Denzil Magnuson, MD [phone] Mon-Fri 8a-5p [phone] after 5p, weekends, holidays

## 2016-10-02 NOTE — Care Management Note (Signed)
Case Management Note  Patient Details  Name: Casey [name] MRN: [id number] Date of Birth: 1940/06/13  Subjective/Objective:   Transfer to SDU-hgb 5.9-prbc,hematuria, flexiseal,bun/creat elevated,on cardizem gtt. NPO.Concerns about PEG.GI cons.Full code. Has dtr/son as contact.Palliative following. From SNF.                 Action/Plan:d/c plan SNF.   Expected Discharge Date:   (UNKNOWN)               Expected Discharge Plan:  Skilled Nursing Facility  In-House Referral:  Clinical Social Work  Discharge planning Services  CM Consult  Post Acute Care Choice:    Choice offered to:     DME Arranged:    DME Agency:     HH Arranged:    [place] Agency:     Status of Service:  In process, will continue to follow  If discussed at Long Length of Stay Meetings, dates discussed:    Additional Comments:  Dessa Phi, RN 10/02/2016, 10:26 AM

## 2016-10-03 DIAGNOSIS — B3749 Other urogenital candidiasis: Secondary | ICD-10-CM

## 2016-10-03 DIAGNOSIS — D62 Acute posthemorrhagic anemia: Secondary | ICD-10-CM

## 2016-10-03 DIAGNOSIS — E876 Hypokalemia: Secondary | ICD-10-CM

## 2016-10-03 DIAGNOSIS — I472 Ventricular tachycardia: Secondary | ICD-10-CM

## 2016-10-03 LAB — COMPREHENSIVE METABOLIC PANEL
ALT: 11 U/L — AB (ref 14–54)
AST: 19 U/L (ref 15–41)
Albumin: 2 g/dL — ABNORMAL LOW (ref 3.5–5.0)
Alkaline Phosphatase: 36 U/L — ABNORMAL LOW (ref 38–126)
Anion gap: 7 (ref 5–15)
BUN: 104 mg/dL — AB (ref 6–20)
CO2: 20 mmol/L — AB (ref 22–32)
CREATININE: 3.29 mg/dL — AB (ref 0.44–1.00)
Calcium: 9 mg/dL (ref 8.9–10.3)
Chloride: 120 mmol/L — ABNORMAL HIGH (ref 101–111)
GFR calc non Af Amer: 13 mL/min — ABNORMAL LOW (ref >60)
GFR, EST AFRICAN AMERICAN: 15 mL/min — AB (ref >60)
Glucose, Bld: 209 mg/dL — ABNORMAL HIGH (ref 65–99)
Potassium: 3.2 mmol/L — ABNORMAL LOW (ref 3.5–5.1)
SODIUM: 147 mmol/L — AB (ref 135–145)
Total Bilirubin: 0.8 mg/dL (ref 0.3–1.2)
Total Protein: 4.3 g/dL — ABNORMAL LOW (ref 6.5–8.1)

## 2016-10-03 LAB — CBC
HCT: 22.5 % — ABNORMAL LOW (ref 36.0–46.0)
HCT: 21 % — ABNORMAL LOW (ref 36.0–46.0)
HEMOGLOBIN: 7.8 g/dL — AB (ref 12.0–15.0)
Hemoglobin: 7.3 g/dL — ABNORMAL LOW (ref 12.0–15.0)
MCH: 29.1 pg (ref 26.0–34.0)
MCH: 29 pg (ref 26.0–34.0)
MCHC: 34.7 g/dL (ref 30.0–36.0)
MCHC: 34.8 g/dL (ref 30.0–36.0)
MCV: 84 fL (ref 78.0–100.0)
MCV: 83.3 fL (ref 78.0–100.0)
Platelets: 91 10*3/uL — ABNORMAL LOW (ref 150–400)
Platelets: 82 10*3/uL — ABNORMAL LOW (ref 150–400)
RBC: 2.68 MIL/uL — AB (ref 3.87–5.11)
RBC: 2.52 MIL/uL — AB (ref 3.87–5.11)
RDW: 16.3 % — ABNORMAL HIGH (ref 11.5–15.5)
RDW: 16.6 % — ABNORMAL HIGH (ref 11.5–15.5)
WBC: 19.1 10*3/uL — ABNORMAL HIGH (ref 4.0–10.5)
WBC: 14.7 10*3/uL — AB (ref 4.0–10.5)

## 2016-10-03 LAB — GLUCOSE, CAPILLARY
GLUCOSE-CAPILLARY: 180 mg/dL — AB (ref 65–99)
Glucose-Capillary: 162 mg/dL — ABNORMAL HIGH (ref 65–99)
Glucose-Capillary: 193 mg/dL — ABNORMAL HIGH (ref 65–99)
Glucose-Capillary: 164 mg/dL — ABNORMAL HIGH (ref 65–99)
Glucose-Capillary: 156 mg/dL — ABNORMAL HIGH (ref 65–99)
Glucose-Capillary: 188 mg/dL — ABNORMAL HIGH (ref 65–99)
Glucose-Capillary: 141 mg/dL — ABNORMAL HIGH (ref 65–99)

## 2016-10-03 LAB — PROTIME-INR
INR: 1.38
PROTHROMBIN TIME: 16.8 s — AB (ref 11.4–15.2)

## 2016-10-03 LAB — MAGNESIUM: MAGNESIUM: 0.6 mg/dL — AB (ref 1.7–2.4)

## 2016-10-03 MED ORDER — POTASSIUM CHLORIDE 10 MEQ/100ML IV SOLN
10.0000 meq | INTRAVENOUS | Status: AC
  Administered 2016-10-03 (×2): 10 meq via INTRAVENOUS
  Filled 2016-10-03 (×2): qty 100

## 2016-10-03 MED ORDER — POTASSIUM CHLORIDE 10 MEQ/100ML IV SOLN
INTRAVENOUS | Status: AC
  Administered 2016-10-03: 10 meq
  Filled 2016-10-03: qty 100

## 2016-10-03 MED ORDER — POTASSIUM CHLORIDE 2 MEQ/ML IV SOLN
INTRAVENOUS | Status: DC
  Administered 2016-10-03 – 2016-10-04 (×3): via INTRAVENOUS
  Filled 2016-10-03 (×6): qty 1000

## 2016-10-03 MED ORDER — FLUCONAZOLE IN SODIUM CHLORIDE 100-0.9 MG/50ML-% IV SOLN
100.0000 mg | INTRAVENOUS | Status: DC
  Administered 2016-10-03 – 2016-10-05 (×3): 100 mg via INTRAVENOUS
  Filled 2016-10-03 (×5): qty 50

## 2016-10-03 MED ORDER — MAGNESIUM SULFATE 4 GM/100ML IV SOLN
4.0000 g | Freq: Once | INTRAVENOUS | Status: AC
  Administered 2016-10-03: 4 g via INTRAVENOUS
  Filled 2016-10-03: qty 100

## 2016-10-03 NOTE — Progress Notes (Addendum)
PMT progress note  Patient seen this morning, moaning, appears uncomfortable. No family at bedside Discussed with bedside RN BP (!) 131/42 (BP Location: Left Arm)   Pulse 93   Temp 98.4 F (36.9 C) (Oral)   Resp (!) 32   Ht '5\' 3"'  (1.6 m)   Wt 57.4 kg (126 lb 8.7 oz)   SpO2 100%   BMI 22.42 kg/m  Labs and imaging reviewed.  Awake not alert Not able to have conversations Appears uncomfortable Has wounds requiring dressing on both LE S1 S2 irregular Appears frail and elderly  76 yo lady from SNF, admitted with A fib with RVR, ongoing GI bleed, recent HCAP, s/p VDRF, also with acute blood loss anemia, AKI on stage III CKD.   Palliative medicine team has been following for goals of care discussions, several family meetings have already taken place with both son and daughter, patient continues to decline, currently PEG placement on hold due to ongoing co morbidities, call placed, but unable to reach daughter Ms Lupita Leash at [phone]. Would recommend DNR DNI and also consideration for comfort measures in this situation.   We will continue to follow along.  35 minutes spent [facility] palliative medicine team [phone]   Addendum: Paged by RN that the patient's daughter Ms Tasia Catchings has arrived by the bedside. I met with her along with Dr Clementeen Graham for a family meeting, goals of care and code status discussion on 10-03-16 at 40.   We reviewed in depth, the patient's extensive multi morbidities: ongoing risk of aspiration, ongoing GI Bleed, A fib requiring Cardizem drip, patient's compromised renal function, patient's debility and decline and prolonged hospitalization.   We discussed in detail about artificial nutrition and hydration, we discussed about PEG tube risks and benefits, all of daughter's concerns addressed to the best of my ability.   Additionally, we discussed that a resuscitation attempt, use of CPR, intubation and mechanical  ventilation may cause the patient more harm than benefit. The patient's daughter states that the patient has been intubated recently and then was able to be extubated.   Patient's daughter wishes to continue full code status, is eager to have some form of nutrition established, she recognizes that the patient has high chance of ongoing decline and decompensation, that she may not have any meaningful recovery or stabilization, how ever, she remains hopeful that the patient will recover once there is some form of nutrition initiated.   We will follow up on SLP evaluation in am and re discuss with daughter.   Additional 35 minutes spent.  Time in 1545 Time out [place] MD [place] palliative care.

## 2016-10-03 NOTE — Progress Notes (Addendum)
PROGRESS NOTE                                                                                                                                                                                                             Patient Demographics:    Casey [name], is a 76 y.o. female, DOB - 11/23/1940, YSA:[id number]  Admit date - 09/14/2016   Admitting Physician Collene Gobble, MD  Outpatient Primary MD for the patient is Gildardo Cranker, DO  LOS - 19  Outpatient Specialists: none  Chief Complaint  Patient presents with  . Headache  . general pain       Brief Narrative   75 year old female with history of CVA, seizure disorder, dementia, chronic kidney disease stage III, hypertension, chronic systolic CHF, malnutrition, anemia of chronic disease, diabetes mellitus type 2 with nephropathy presented to the ED with fever and myalgias. She was found to have healthcare associated pneumonia. She subsequently developed respiratory failure requiring intubation. Hospital course prolonged with development of A. fib requiring Cardizem drip. She also developed acute systolic CHF requiring diuresis. Patient required NG tube for feeding which she reported out and has been now planned for a PEG tube placement. Hospital course further complicated with lower GI bleed with blood loss anemia requiring transfusion. Also had persistent renal insufficiency and coagulopathy.   Subjective:   Patient is unable to communicate. Had short run of V. tach early this morning. Has been requiring low-dose Cardizem to maintain her heart rate.   Assessment  & Plan :   Principal problem Atrial fibrillation with RVR CHA2DS2-VASc Score8, not on anticoagulation due to acute blood loss anemia. -Still requiring low-dose Cardizem drip for heart rate control (reportedly patient goes into  rapid A. fib once Cardizem drip is discontinued).  -Continue  metoprolol.  Acute on chronic systolic CHF EF of 32-35% with diffuse hypokinesis. Lasix now held due to worsening renal failure. Monitor I/O.  Acute hypoxic respiratory failure secondary to healthcare associated pneumonia Required ventilator support from 8/17-8/22. Now complicated days of antibiotics. Tracheal aspirate negative for growth.  Yeast  UTI Worsening leukocytosis after completion of antibiotics. Chest x-ray negative. Was on empiric cefepime for UTI. Antibiotic discontinued and started on IV fluconazole.  Acute on chronic kidney disease stage III Possibly due to dehydration and Lasix which is held. Renal function slightly improved this  morning. Continue to monitor.  Coagulopathy INR better this am ( 1.38) after receiving vitamin k yesterday.  Acute blood loss anemia Hemoglobin dropped to 4.2 on 9/2 and has received a total of 5 units PRBC since then. Continue PPI. GI was consulted who recommended given her acute comorbidities she is unlikely to tolerate GI procedure or anesthesia. Recommended palliative care evaluation.  NSVT Replace potassium and magnesium aggressively.  Hypokalemia/hypomagnesemia Replaced  Severe protein calorie malnutrition/failure to thrive/Dysphagia  IR consulted for feeding tube placement, being held for GI bleed, UTI and coagulopathy. Will reevaluate . Patient remains high aspiration risk even after feeding tube is placed.   Hypernatremia Secondary to dehydration. Improving with D5.   Diabetes mellitus type 2 with nephropathy Monitor on sliding scale coverage.   seizure disorder Continue Keppra  History of CVA with ?Vascular dementia Does not have capacity to make medical decision.   Goals of care Palliative care involved and had goals of care discussion on 9/3. Patient is unable to participate in discussion and GOC was discussed with patient's son and daughter on the phone. They wished on continuing aggressive care including placement  of feeding tube. Depending on her progress over the next 24-48 hours I will discuss with family again.    Code Status :Full code, palliative care involved in goals of care discussion.  Family Communication  : None at bedside. Will discuss with her son  Disposition Plan  : Pending clinical improvement and hospital course  Barriers For Discharge : Active symptoms  Consults  :   IR GI PC CM Palliative care   Procedures  :  2-D echo  DVT Prophylaxis  : SCDs  Lab Results  Component Value Date   PLT 82 (L) 10/03/2016    Antibiotics  :    Anti-infectives    Start     Dose/Rate Route Frequency Ordered Stop   10/03/16 1000  fluconazole (DIFLUCAN) IVPB 100 mg     100 mg 50 mL/hr over 60 Minutes Intravenous Every 24 hours 10/03/16 0838     10/02/16 1000  ceFEPIme (MAXIPIME) 500 mg in dextrose 5 % 50 mL IVPB  Status:  Discontinued     500 mg 100 mL/hr over 30 Minutes Intravenous Every 24 hours 10/01/16 1538 10/03/16 0838   10/02/16 0800  vancomycin (VANCOCIN) IVPB 1000 mg/200 mL premix     1,000 mg 200 mL/hr over 60 Minutes Intravenous To Radiology 09/28/16 1525 10/02/16 0905   10/01/16 1200  ciprofloxacin (CIPRO) IVPB 400 mg  Status:  Discontinued     400 mg 200 mL/hr over 60 Minutes Intravenous Every 24 hours 10/01/16 1018 10/01/16 1538   09/15/16 0600  vancomycin (VANCOCIN) IVPB 1000 mg/200 mL premix  Status:  Discontinued     1,000 mg 200 mL/hr over 60 Minutes Intravenous Every 24 hours 09/14/16 1100 09/17/16 0908   09/15/16 0000  vancomycin (VANCOCIN) IVPB 1000 mg/200 mL premix  Status:  Discontinued     1,000 mg 200 mL/hr over 60 Minutes Intravenous  Once 09/14/16 0732 09/14/16 1100   09/14/16 1200  ceFEPIme (MAXIPIME) 2 g in dextrose 5 % 50 mL IVPB     2 g 100 mL/hr over 30 Minutes Intravenous Every 24 hours 09/14/16 1100 09/20/16 2359   09/14/16 0745  piperacillin-tazobactam (ZOSYN) IVPB 3.375 g  Status:  Discontinued     3.375 g 100 mL/hr over 30 Minutes  Intravenous Every 6 hours 09/14/16 0732 09/14/16 1057   09/14/16 0600  vancomycin (VANCOCIN) IVPB 1000  mg/200 mL premix     1,000 mg 200 mL/hr over 60 Minutes Intravenous  Once 09/14/16 0554 09/14/16 0819   09/14/16 0600  piperacillin-tazobactam (ZOSYN) IVPB 3.375 g     3.375 g 100 mL/hr over 30 Minutes Intravenous  Once 09/14/16 0554 09/14/16 0819        Objective:   Vitals:   10/03/16 0354 10/03/16 0400 10/03/16 0600 10/03/16 0800  BP:  (!) 131/48 (!) 129/32 (!) 133/33  Pulse:      Resp:  (!) 31 (!) 27 (!) 31  Temp: 98.3 F (36.8 C)   98.3 F (36.8 C)  TempSrc: Oral   Oral  SpO2:  98% 100% 100%  Weight: 57.4 kg (126 lb 8.7 oz)     Height:        Wt Readings from Last 3 Encounters:  10/03/16 57.4 kg (126 lb 8.7 oz)  09/13/16 56 kg (123 lb 8 oz)  09/10/16 56 kg (123 lb 8 oz)     Intake/Output Summary (Last 24 hours) at 10/03/16 1138 Last data filed at 10/03/16 1000  Gross per 24 hour  Intake          3320.52 ml  Output             1485 ml  Net          1835.52 ml     Physical Exam Gen.: Elderly frail female nonverbal, not in distress  HEENT:Pallor present, dry mucosa, supple neck, no JVD, poor dentition  Chest: clear b/l, no added sounds CVS:  S1 and S2 irregular, no murmurs rubs or gallop  GI: soft, NT, ND, BS+, foley+  Musculoskeletal: warm, no edema CNS: AAOX0    Data Review:    CBC  Recent Labs Lab 09/30/16 0745 10/01/16 0337 10/02/16 0352 10/02/16 1528 10/03/16 0423  WBC 20.8* 25.6* 20.7* 19.1* 14.7*  HGB 4.9* 7.6* 5.9* 7.8* 7.3*  HCT 16.2* 22.1* 17.8* 22.5* 21.0*  PLT 226 171 110* 91* 82*  MCV 86.6 87.4 82.8 84.0 83.3  MCH 26.2 30.0 27.4 29.1 29.0  MCHC 30.2 34.4 33.1 34.7 34.8  RDW 17.9* 16.2* 18.1* 16.3* 16.6*    Chemistries   Recent Labs Lab 09/27/16 0343  09/29/16 0544 09/30/16 1040 10/01/16 0337 10/02/16 0352 10/03/16 0423  NA 150*  < > 155* 162* 158* 151* 147*  K 3.9  < > 4.0 4.6 4.8 3.9 3.2*  CL 110  < > 119* 125* 126*  121* 120*  CO2 30  < > 31 24 21* 21* 20*  GLUCOSE 144*  < > 133* 189* 233* 278* 209*  BUN 43*  < > 49* 98* 111* 117* 104*  CREATININE 2.11*  < > 1.84* 3.34* 4.41* 4.39* 3.29*  CALCIUM 9.9  < > 9.8 9.6 9.5 9.1 9.0  MG 1.9  --   --   --   --   --  0.6*  AST  --   --   --   --   --   --  19  ALT  --   --   --   --   --   --  11*  ALKPHOS  --   --   --   --   --   --  36*  BILITOT  --   --   --   --   --   --  0.8  < > = values in this interval not displayed. ------------------------------------------------------------------------------------------------------------------ No results for input(s): CHOL, HDL,  LDLCALC, TRIG, CHOLHDL, LDLDIRECT in the last 72 hours.  Lab Results  Component Value Date   HGBA1C 6.6 (H) 08/30/2016   ------------------------------------------------------------------------------------------------------------------ No results for input(s): TSH, T4TOTAL, T3FREE, THYROIDAB in the last 72 hours.  Invalid input(s): FREET3 ------------------------------------------------------------------------------------------------------------------ No results for input(s): VITAMINB12, FOLATE, FERRITIN, TIBC, IRON, RETICCTPCT in the last 72 hours.  Coagulation profile  Recent Labs Lab 09/28/16 0459 09/29/16 0544 10/01/16 0337 10/02/16 0352 10/03/16 0423  INR 2.57 3.16 4.08* 4.03* 1.38    No results for input(s): DDIMER in the last 72 hours.  Cardiac Enzymes  Recent Labs Lab 10/02/16 1738  TROPONINI 0.04*   ------------------------------------------------------------------------------------------------------------------    Component Value Date/Time   BNP 932.9 (H) 09/14/2016 1050    Inpatient Medications  Scheduled Meds: . bacitracin  1 application Topical BID  . chlorhexidine gluconate (MEDLINE KIT)  15 mL Mouth Rinse BID  . insulin aspart  0-15 Units Subcutaneous Q4H  . mouth rinse  15 mL Mouth Rinse QID  . metoprolol tartrate  5 mg Intravenous Q6H  .  pantoprazole (PROTONIX) IV  40 mg Intravenous Q12H  . sodium chloride flush  10-40 mL Intracatheter Q12H   Continuous Infusions: . dextrose 5 % 1,000 mL with potassium chloride 40 mEq infusion    . diltiazem (CARDIZEM) infusion 3 mg/hr (10/03/16 1000)  . fluconazole (DIFLUCAN) IV 100 mg (10/03/16 0920)  . levETIRAcetam Stopped (10/02/16 2158)   PRN Meds:.acetaminophen, bisacodyl, ipratropium-albuterol, metoprolol tartrate, morphine injection, ondansetron (ZOFRAN) IV, sodium chloride flush  Micro Results Recent Results (from the past 240 hour(s))  Culture, Urine     Status: Abnormal   Collection Time: 09/30/16  6:03 PM  Result Value Ref Range Status   Specimen Description URINE, CATHETERIZED  Final   Special Requests NONE  Final   Culture >=100,000 COLONIES/mL YEAST (A)  Final   Report Status 10/02/2016 FINAL  Final    Radiology Reports Dg Chest 2 View  Result Date: 09/14/2016 CLINICAL DATA:  Generalized body aches, atrial fibrillation. Sepsis. History of lymphoma. EXAM: CHEST  2 VIEW COMPARISON:  Chest radiograph September 07, 2016 FINDINGS: New patchy airspace opacities RIGHT lung and LEFT lung base. No pleural effusion. Stable cardiomegaly. Fullness of the RIGHT greater than LEFT hila is unchanged. Calcified aortic knob. Similar chronic interstitial changes. No pneumothorax. Soft tissue planes and included osseous structures are nonsuspicious. IMPRESSION: New bilateral patchy airspace opacities concerning for pneumonia. Followup PA and lateral chest X-ray is recommended in 3-4 weeks following trial of antibiotic therapy to ensure resolution and exclude underlying malignancy. Stable cardiomegaly and chronic interstitial changes. Stable fullness of the hila concerning for lymphadenopathy given patient's history of lymphoma. Electronically Signed   By: Elon Alas M.D.   On: 09/14/2016 04:49   Dg Chest 2 View  Result Date: 09/07/2016 CLINICAL DATA:  Tachycardia today. EXAM: CHEST  2  VIEW COMPARISON:  CT chest 01/10/2015.  PA and lateral chest 12/06/2011. FINDINGS: There is cardiomegaly without edema. Fullness of the right hilum consistent with lymphadenopathy as seen on the prior CT scan is unchanged. The lungs are clear. There is no pneumothorax. Small bilateral pleural effusions are seen. Aortic atherosclerosis is noted. IMPRESSION: Small bilateral pleural effusions. Cardiomegaly without edema. Atherosclerosis. Fullness of the right hilum consistent with lymphadenopathy as seen on prior CT scan is unchanged. Electronically Signed   By: Inge Rise M.D.   On: 09/07/2016 12:17   Dg Abd 1 View  Result Date: 09/21/2016 CLINICAL DATA:  Nasogastric tube placement. EXAM: ABDOMEN - 1  VIEW COMPARISON:  Yesterday. FINDINGS: Feeding tube tip with its tip in scratch sec peak 2 tip in the mid stomach. No a nasogastric tube is seen. Normal bowel gas pattern. Left iliac stent. Lumbar spine degenerative changes. Small to moderate-sized right pleural effusion, increased. Interval small left pleural effusion. IMPRESSION: 1. Feeding tube tip in the mid stomach. 2. No nasogastric tube seen. 3. Small moderate-sized right pleural effusion and small left pleural effusion. Electronically Signed   By: Claudie Revering M.D.   On: 09/21/2016 20:49   Dg Abd 1 View  Result Date: 09/20/2016 CLINICAL DATA:  Evaluate NG tube EXAM: ABDOMEN - 1 VIEW COMPARISON:  Abdomen radiograph 09/19/2016 FINDINGS: Enteric tube tip projects over the left upper quadrant, feeding tube projects over the stomach. Mildly gaseous distended stomach. Nonobstructed bowel gas pattern. Lumbar spine degenerative changes. Cardiomegaly. Basilar atelectasis. Stent projects over the left hemipelvis. Bilateral lower abdominal surgical clips. IMPRESSION: Feeding tube within the mid stomach. Electronically Signed   By: Lovey Newcomer M.D.   On: 09/20/2016 15:38   Dg Abd 1 View  Result Date: 09/19/2016 CLINICAL DATA:  Pain and a placement EXAM:  ABDOMEN - 1 VIEW COMPARISON:  09/14/2016 FINDINGS: Soft feeding tube enters the abdomen with its tip in the midportion of the stomach. Bowel gas pattern appears normal. IMPRESSION: Feeding tube tip in the midportion of the stomach. Electronically Signed   By: Nelson Chimes M.D.   On: 09/19/2016 11:17   Dg Abd 1 View  Result Date: 09/14/2016 CLINICAL DATA:  OG tube placement EXAM: ABDOMEN - 1 VIEW COMPARISON:  09/14/2016 FINDINGS: OG tube tip is in the distal stomach. Nonobstructive bowel gas pattern. IMPRESSION: OG tube tip in the distal stomach. Electronically Signed   By: Rolm Baptise M.D.   On: 09/14/2016 11:04   Ct Angio Ao+bifem W & Or Wo Contrast  Result Date: 09/07/2016 CLINICAL DATA:  Status post repair of left femoral pseudoaneurysm with revision of right the left femoral- femoral bypass graft and left femoral-popliteal bypass graft. There has been some oozing of blood from the left groin operative site. EXAM: CT ANGIOGRAPHY OF ABDOMINAL AORTA WITH ILIOFEMORAL RUNOFF TECHNIQUE: Multidetector CT imaging of the abdomen, pelvis and lower extremities was performed using the standard protocol during bolus administration of intravenous contrast. Multiplanar CT image reconstructions and MIPs were obtained to evaluate the vascular anatomy. CONTRAST:  100 mL Isovue 370 IV COMPARISON:  CT of the abdomen and pelvis on 08/29/2016 FINDINGS: VASCULAR Aorta: The abdominal aorta and shows stable patency and atherosclerotic plaque. No evidence of aneurysmal disease or significant aortic stenosis. Celiac: Calcified plaque causes approximately 40- 50% origin stenosis. SMA: Calcified plaque causes approximately 50% origin stenosis. Renals: Bilateral single renal arteries without significant stenosis. IMA: Origin patent. The proximal trunk shows heavily calcified plaque without occlusion. RIGHT Lower Extremity Inflow: Calcified common iliac artery without significant stenosis. Internal and external iliac arteries show  diffuse disease. Maximal narrowing of the proximal external iliac artery approaches 50%. Outflow: Calcified plaque causes focal narrowing of the common femoral artery just prior to the femoral-femoral bypass graft. The artery is narrowed approximately 60- 70%. The femoral-femoral graft is patent but is poorly opacified distally in the left groin. There is some thickening around the distal graft extending superiorly and abutting the abdominal wall. There likely is some postoperative hemorrhage in this region as well as a few foci of air. No findings to suggest focal abscess. 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 Profunda femoral artery  is patent. At the level of the popliteal artery, there is poor arterial opacification and popliteal patency cannot be adequately assessed. Runoff: Patency of runoff below the right knee cannot be adequately assessed, due to poor opacification of distal vessels. 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 Internal and external iliac arteries are also chronically occluded. At the level of the left groin, the distal aspect of the femoral- femoral bypass graft is poorly opacified and there is evidence now of a new synthetic femoral-popliteal bypass graft which is poorly opacified. Outflow: Unable to assess distal bypass graft and native popliteal artery patency. Runoff: Unable to assess runoff artery patency. Review of the MIP images confirms the above findings. NON-VASCULAR Lower chest: Bibasilar scarring and small bilateral pleural effusions, right greater than left. Hepatobiliary: No focal liver abnormality is seen. No gallstones, gallbladder wall thickening, or biliary dilatation. Pancreas: Unremarkable. No pancreatic ductal dilatation or surrounding inflammatory changes. Spleen: Normal in size without focal abnormality. Adrenals/Urinary Tract: Adrenal glands are unremarkable. Stable  nonobstructing right renal calculi. Stable lower pole renal cyst on the left. Stable small right-sided bladder calculus. Stomach/Bowel: No evidence of bowel obstruction or ileus. There is a fairly large amount of fecal material located in the rectum. No free air. Lymphatic: No enlarged lymph nodes identified. Reproductive: Uterus demonstrates calcified degenerated fibroids. No adnexal masses. Other: No free fluid or hernias. Musculoskeletal: Degenerative disc disease at L5-S1. IMPRESSION: VASCULAR 1. Poorly opacified distal segment of the right to left femoral- femoral bypass graft and poorly opacified new left femoral to popliteal bypass graft. Although some of this is felt to relate to contrast timing, there would be some concern of the potential for imminent thrombosis and vascular surgical assessment is recommended. The previously identified left groin pseudoaneurysm has been resected and repaired and there is no evidence of recurrent pseudoaneurysm or active contrast extravasation. Some postoperative changes are evident around the graft likely representing some hemorrhage that extends superiorly abutting the abdominal wall. 2. The native right common femoral artery does demonstrate significant disease and focal moderate stenosis just proximal to the bypass graft anastomosis which could account for some potential inflow restriction into the femoral-femoral graft. 3. Poor opacification at the level of bilateral popliteal and tibial arteries on the CTA results and lack of accurate patency assessment of these vessels. NON-VASCULAR 1. Stable nonobstructing right renal calculi and bladder calculus. 2. Fairly large amount of stool is present in the rectum. Electronically Signed   By: Aletta Edouard M.D.   On: 09/07/2016 16:06   Dg Chest Port 1 View  Result Date: 09/30/2016 CLINICAL DATA:  SOb today EXAM: PORTABLE CHEST - 1 VIEW COMPARISON:  09/24/2016 FINDINGS: Lungs are clear.  Left arm PICC line to the distal  SVC as before. Heart size and mediastinal contours are within normal limits. Atheromatous aorta. No effusion.  No pneumothorax. Visualized bones unremarkable. IMPRESSION: No acute cardiopulmonary disease. Aortic Atherosclerosis (ICD10-170.0) Electronically Signed   By: Lucrezia Europe M.D.   On: 09/30/2016 09:22   Dg Chest Port 1 View  Result Date: 09/24/2016 CLINICAL DATA:  Shortness breath, CHF EXAM: PORTABLE CHEST 1 VIEW COMPARISON:  09/22/2016 FINDINGS: Left PICC line remains in place, unchanged. Cardiomegaly with vascular congestion, improving since prior study. Improving aeration in the lung bases. No overt edema, effusions or confluent opacities. IMPRESSION: Cardiomegaly, vascular congestion. Vascular congestion has improved since prior study with improving aeration in the lung bases. Electronically Signed   By: Lennette Bihari  [place] M.D.   On: 09/24/2016 07:49   Dg Chest Port 1 View  Result Date: 09/22/2016 CLINICAL DATA:  Acute respiratory failure with hypoxia Hx CHF, diabetes, HTN, former smoker. EXAM: PORTABLE CHEST - 1 VIEW COMPARISON:  the previous day's study FINDINGS: Feeding tube and left arm PICC stable in position. Perihilar and bibasilar infiltrates or edema left greater than right, slightly improved since previous exam. Stable cardiomegaly.  Atheromatous aorta. Small right pleural effusion as before. Visualized bones unremarkable. IMPRESSION: 1. Asymmetric infiltrates or edema, slightly improved since previous exam. 2. Stable cardiomegaly and small right effusion. 3.  Support hardware stable in position. Electronically Signed   By: Lucrezia Europe M.D.   On: 09/22/2016 11:19   Dg Chest Port 1 View  Result Date: 09/21/2016 CLINICAL DATA:  Pneumonia. EXAM: PORTABLE CHEST 1 VIEW COMPARISON:  09/20/2016. FINDINGS: Feeding tube, left PICC line in stable position. Cardiomegaly with mild bilateral interstitial prominence and pleural effusions consistent with congestive heart failure. Similar findings on prior  exam. Basilar atelectasis. No pneumothorax. IMPRESSION: 1. Lines and tubes in stable position. 2. Cardiomegaly with diffuse mild bilateral interstitial prominence and bilateral pleural effusions consistent with congestive heart failure. Similar findings noted on prior exam. 3. Basilar atelectasis . Electronically Signed   By: Marcello Moores  Register   On: 09/21/2016 06:23   Dg Chest Port 1 View  Result Date: 09/20/2016 CLINICAL DATA:  Dyspnea EXAM: PORTABLE CHEST 1 VIEW COMPARISON:  09/19/2016 FINDINGS: Multifocal perihilar and bilateral lower lobe opacities, suspicious for moderate interstitial edema, less likely multifocal pneumonia. Associated small bilateral pleural effusions, right greater than left. Overall appearance is likely mildly progressed. Cardiomegaly. Left arm PICC terminates at the cavoatrial junction. Prior enteric tube has been replaced with a feeding tube which courses into the stomach. IMPRESSION: Suspected moderate interstitial edema, mildly progressed. Small bilateral pleural effusions, right greater than left. Electronically Signed   By: Julian Hy M.D.   On: 09/20/2016 23:06   Dg Chest Port 1 View  Result Date: 09/19/2016 CLINICAL DATA:  Respiratory failure. EXAM: PORTABLE CHEST 1 VIEW COMPARISON:  09/18/2016. FINDINGS: Endotracheal tube, NG tube, left PICC line stable position . Cardiomegaly with slight increase in basilar infiltrates and small pleural effusions consistent with slight worsening of CHF. No pneumothorax. IMPRESSION: 1.  Lines and tubes in stable position. 2. Cardiomegaly with slight increase in basilar infiltrates and small pleural effusions consistent with slight worsening of CHF. Electronically Signed   By: Marcello Moores  Register   On: 09/19/2016 06:21   Dg Chest Port 1 View  Result Date: 09/18/2016 CLINICAL DATA:  PICC line placement. EXAM: PORTABLE CHEST 1 VIEW COMPARISON:  09/18/2016. FINDINGS: PICC line noted with its tip projected over the superior vena cava.  Endotracheal tube and NG tube in stable position. Cardiomegaly with bilateral mild interstitial prominence and small pleural effusions consistent CHF. Mild basilar atelectasis.Slight improvement from prior exam. IMPRESSION: 1.  Left PICC line in stable position. 2.  Endotracheal tube and NG tube in stable position. 3.  Mild from prior exam . Electronically Signed   By: Marcello Moores  Register   On: 09/18/2016 12:54   Dg Chest Port 1 View  Result Date: 09/18/2016 CLINICAL DATA:  Respiratory failure, history of lymphoma, CHF, peripheral vascular disease, current smoker. EXAM: PORTABLE CHEST 1 VIEW COMPARISON:  Portable chest x-ray of September 17, 2016 FINDINGS: The trachea remains intubated with the tip of the tube lying 2.5 cm above the carina. The esophagogastric tube tip projects below the inferior margin of the  image. The lungs are well-expanded. The lung markings are coarse in the lower lobes. A small right pleural effusion likely layers posteriorly. The cardiac silhouette remains enlarged. The pulmonary vascularity is minimally prominent. There is calcification in the wall of the thoracic aorta. IMPRESSION: COPD. Probable subsegmental atelectasis or infiltrate in the lower lobes without discrete air bronchograms. Reasonable positioning of the endotracheal tube. Mild cardiomegaly with mild pulmonary vascular congestion. Thoracic aortic atherosclerosis. Electronically Signed   By: David  Martinique M.D.   On: 09/18/2016 07:09   Dg Chest Port 1 View  Result Date: 09/17/2016 CLINICAL DATA:  Respiratory failure. EXAM: PORTABLE CHEST 1 VIEW COMPARISON:  09/16/2016. FINDINGS: Endotracheal tube tip 1.4 cm above the carina. Retraction of approximately 2 cm should be considered. NG tube noted in stable position. Cardiomegaly with diffuse mild bilateral interstitial prominence and small bilateral pleural effusions suggesting CHF again noted. Similar findings noted on prior exam . Improved basilar atelectasis. No pneumothorax.  IMPRESSION: 1. Endotracheal tube tip 1.4 cm above the carina. Retraction of approximately 2 cm should be considered. NG tube noted stable position. 2. Cardiomegaly with diffuse mild bilateral interstitial prominence and small bilateral pleural effusions suggesting CHF again noted. Similar findings noted on prior exam. 3. Improved bibasilar atelectasis. Electronically Signed   By: Marcello Moores  Register   On: 09/17/2016 06:48   Dg Chest Port 1 View  Result Date: 09/16/2016 CLINICAL DATA:  Acute respiratory failure EXAM: PORTABLE CHEST 1 VIEW COMPARISON:  Two days ago FINDINGS: Endotracheal tube tip between the clavicular heads and carina. An orogastric tube has been advanced to the stomach at least. Cardiomegaly. Partial visualization of the left diaphragm. Haziness of the lower chest is likely from atelectasis and pleural effusions. Atelectasis about the right minor fissure and patchy primarily perihilar opacity. Interstitial coarsening is improved. No pneumothorax. IMPRESSION: 1. Unremarkable positioning of tubes and central line. 2. Interstitial coarsening is improved, likely decreasing edema. 3. Patchy bilateral airspace opacity from residual airspace edema or pneumonia. Progressive atelectasis at the left base with volume loss. Electronically Signed   By: Monte Fantasia M.D.   On: 09/16/2016 07:02   Dg Chest Portable 1 View  Result Date: 09/14/2016 CLINICAL DATA:  Intubated.  OG tube placement. EXAM: PORTABLE CHEST 1 VIEW COMPARISON:  09/14/2016 FINDINGS: Endotracheal tube is 4 cm above the carina. OG tube tip is in the midesophagus. Cardiomegaly. Patchy bilateral airspace opacities, right greater than left are similar to prior study. No effusions. IMPRESSION: Endotracheal tube 4 cm above the carina. OG tube tip in the midthoracic esophagus. Recommend advancing. Stable patchy bilateral airspace opacities, right greater than left. Electronically Signed   By: Rolm Baptise M.D.   On: 09/14/2016 09:19   Dg Abd  Portable 1v  Result Date: 09/22/2016 CLINICAL DATA:  Check feeding catheter placement EXAM: PORTABLE ABDOMEN - 1 VIEW COMPARISON:  None. FINDINGS: Feeding catheter is noted in the distal stomach. Contrast material is noted throughout the colon. No other focal abnormality is noted. IMPRESSION: Feeding catheter within the stomach. Electronically Signed   By: Inez Catalina M.D.   On: 09/22/2016 17:43   Dg Abd Portable 1v  Result Date: 09/14/2016 CLINICAL DATA:  NG tube placement. EXAM: PORTABLE ABDOMEN - 1 VIEW COMPARISON:  CT scan 09/07/2016 FINDINGS: The NG tube tip is in the body region the stomach. The bowel gas pattern is unremarkable. IMPRESSION: NG tube tip is in the body region of the stomach. Electronically Signed   By: Marijo Sanes M.D.   On: 09/14/2016  09:18   Dg Swallowing Func-speech Pathology  Result Date: 09/22/2016 Objective Swallowing Evaluation: Type of Study: MBS-Modified Barium Swallow Study Patient Details Name: LEISEL PINETTE MRN: [id number] Date of Birth: [birth year] Today's Date: 09/22/2016 Time: SLP Start Time (ACUTE ONLY): 1400-SLP Stop Time (ACUTE ONLY): 1420 SLP Time Calculation (min) (ACUTE ONLY): 20 min Past Medical History: Past Medical History: Diagnosis Date . Abdominal or pelvic swelling, mass, or lump, left upper quadrant  . CHF (congestive heart failure) ([place])  . Chronic atrial fibrillation ([place])  . Chronic kidney disease  . Congestive heart failure, unspecified  . Coronary atherosclerosis of unspecified type of vessel, native or graft  . Diabetes mellitus  . Edema  . GERD (gastroesophageal reflux disease)  . Gout, unspecified  . Hypercholesterolemia  . Hypertension  . 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 . Peripheral vascular disease, unspecified ([place])  . Seizures ([place])  . Sinus of Valsalva aneurysm   a. By 2D echo 05/2011. .  Stroke ([place])  . Unspecified hereditary and idiopathic peripheral neuropathy  . Unspecified urinary incontinence  . Vascular dementia, uncomplicated  Past Surgical History: Past Surgical History: Procedure Laterality Date . AORTOGRAM  09/01/2008  Abd w/ bilateral lower extremity runoff arteriography . APPLICATION OF WOUND VAC Left 06/03/3873  Procedure: APPLICATION OF WOUND VAC LEFT GROIN;  Surgeon: Serafina Mitchell, MD;  Location: [place];  Service: Vascular;  Laterality: Left; . ENDARTERECTOMY FEMORAL Left 08/30/2016  Procedure: LEFT COMMON FEMORAL ARTERY ENDARTERECTOMY;  Surgeon: Serafina Mitchell, MD;  Location: [place];  Service: Vascular;  Laterality: Left; . FALSE ANEURYSM REPAIR  12/06/2011  Procedure: REPAIR FALSE ANEURYSM;  Surgeon: Angelia Mould, MD;  Location: [facility] OR;  Service: Vascular;  Laterality: Left;  Repair of left femoral Artery pseudoaneurysm . FALSE ANEURYSM REPAIR Left 08/30/2016  Procedure: REPAIR PSEUDOANEURYSM LEFT GROIN;  Surgeon: Serafina Mitchell, MD;  Location: [place];  Service: Vascular;  Laterality: Left; . FEMORAL ARTERY EXPLORATION  12/06/2011  Procedure: FEMORAL ARTERY EXPLORATION;  Surgeon: Angelia Mould, MD;  Location: [facility] OR;  Service: Vascular;  Laterality: N/A;  Exploration of large pseudoaneurysm left side of fem-fem bypass graft  . femoral to femoral bypass graft    . FEMORAL-FEMORAL BYPASS GRAFT  12/06/2011  Procedure: BYPASS GRAFT FEMORAL-FEMORAL ARTERY;  Surgeon: Angelia Mould, MD;  Location[facility] OR;  Service: Vascular;  Laterality: Bilateral;  Revision of left to right Femoral-Femoral bypass graft . FEMORAL-FEMORAL BYPASS GRAFT Left 08/30/2016  Procedure: REDO RIGHT TO LEFT FEMORAL-FEMORAL ARTERY BYPASS GRAFT;  Surgeon: Serafina Mitchell, MD;  Location: [place];  Service: Vascular;  Laterality: Left; . FEMORAL-POPLITEAL BYPASS GRAFT Left 05/2002  lower extremity femoral to below knee w/ non-versed greater saphenous vein . FEMORAL-POPLITEAL BYPASS GRAFT Left 11/2002 .  FEMORAL-POPLITEAL BYPASS GRAFT Left 08/30/2016  Procedure: REVISION OF LEFT FEMORAL-POPLITEAL ARTERY BYPASS GRAFT;  Surgeon: Serafina Mitchell, MD;  Location: [place];  Service: Vascular;  Laterality: Left; . THROMBECTOMY FEMORAL ARTERY Left 08/30/2016  Procedure: THROMBECTOMY OF FEMORAL-FEMORAL ARTERY BYPASS GRAFT AND LEFT FEMORAL TO LEFT POPLITEAL BYPASS GRAFT;  Surgeon: Serafina Mitchell, MD;  Location: [place];  Service: Vascular;  Laterality: Left; . TUBAL LIGATION Bilateral  HPI: Lisel A Kittrellis a 76 y.o.femalewith a past medical history significant for lymphoma, dementia, pAF not on anticoag, CVA with R hemiparesis, CHF EF 35%, HTN, PVD s/p fem-fem bypass,  seizures and legally blindwho presents with AMS, Pt found to have pna and required intubation from 8/17-8/22.  Swallow evaluation ordered.  Subjective: upright in Ha[place] chair Assessment / Plan / Recommendation CHL IP CLINICAL IMPRESSIONS 09/22/2016 Clinical Impression Patient presents with mild oral, moderate pharyngeal and suspected primary esophageal dysphagia. Deviations in swallow physiology include impaired pharyngeal sensation which is likely multifactorial given pt's intubation, hx of GERD. This resulted in delayed swallow initiation with liquids, which at times did not occur until liquids were already in pt's cervical esophagus (UES open at rest, NG present). This resulted in 1 episode of trace, sensed aspiration of thin liquids which ocurred during the swallow. Oral stage was largely functional with the exception of pt's difficulty transiting barium tablet; she was unable to swallow whole or when broken into smaller pieces, though this is likely secondary to esophageal dysphagia, as pt was repeatedly belching and gagging slightly during attempts. Pharyngeal stage characterized by adequate tongue base retraction and hyolaryngeal excursion, reduced pharyngeal stripping wave suspect secondary to suboptimal esophageal pressures. Cervical esophageal  phase notable for intermittent backflow into the cervical esophagus and pharynx with thin and nectar thick liquids, regular solids. Frequent wet belching with thin liquids as well as delayed coughing which did not appear to be associated with penetration/aspiration events, however penetration/aspiration of esophageal backflow between frames not excluded. An esophageal sweep with nectar thick liquids revealed a standing column of barium in the upper esophagus, with retrograde bolus flow through the UES into the pharynx. Recommend pt remain NPO with NG at this time; she may benefit from GI referral, esophageal workup. SLP will f/u for readiness for PO/repeat instrumental pending follow-up for her suspected primary esophageal dysphagia.  SLP Visit Diagnosis Dysphagia, oropharyngeal phase (R13.12);Dysphagia, pharyngoesophageal phase (R13.14) Attention and concentration deficit following -- Frontal lobe and executive function deficit following -- Impact on safety and function Severe aspiration risk   CHL IP TREATMENT RECOMMENDATION 09/22/2016 Treatment Recommendations F/U MBS in --- days (Comment);Therapy as outlined in treatment plan below   Prognosis 09/22/2016 Prognosis for Safe Diet Advancement Guarded Barriers to Reach Goals Severity of deficits;Cognitive deficits Barriers/Prognosis Comment -- CHL IP DIET RECOMMENDATION 09/22/2016 SLP Diet Recommendations NPO;Alternative means - temporary Liquid Administration via -- Medication Administration Via alternative means Compensations -- Postural Changes --   CHL IP OTHER RECOMMENDATIONS 09/22/2016 Recommended Consults Consider GI evaluation;Consider esophageal assessment Oral Care Recommendations Oral care QID Other Recommendations --   CHL IP FOLLOW UP RECOMMENDATIONS 09/22/2016 Follow up Recommendations Other (comment)   CHL IP FREQUENCY AND DURATION 09/22/2016 Speech Therapy Frequency (ACUTE ONLY) min 2x/week Treatment Duration 2 weeks      CHL IP ORAL PHASE 09/22/2016 Oral  Phase Impaired Oral - Pudding Teaspoon -- Oral - Pudding Cup -- Oral - Honey Teaspoon -- Oral - Honey Cup -- Oral - Nectar Teaspoon -- Oral - Nectar Cup -- Oral - Nectar Straw -- Oral - Thin Teaspoon -- Oral - Thin Cup -- Oral - Thin Straw -- Oral - Puree -- Oral - Mech Soft -- Oral - Regular -- Oral - Multi-Consistency -- Oral - Pill Lingual pumping;Reduced posterior propulsion;Holding of bolus;Lingual/palatal residue Oral Phase - Comment --  CHL IP PHARYNGEAL PHASE 09/22/2016 Pharyngeal Phase Impaired Pharyngeal- Pudding Teaspoon -- Pharyngeal -- Pharyngeal- Pudding Cup -- Pharyngeal -- Pharyngeal- Honey Teaspoon -- Pharyngeal -- Pharyngeal- Honey Cup -- Pharyngeal -- Pharyngeal- Nectar Teaspoon -- Pharyngeal -- Pharyngeal- Nectar Cup Delayed swallow initiation-pyriform sinuses Pharyngeal -- Pharyngeal- Nectar Straw -- Pharyngeal -- Pharyngeal- Thin Teaspoon --  Pharyngeal -- Pharyngeal- Thin Cup Delayed swallow initiation-pyriform sinuses;Reduced pharyngeal peristalsis Pharyngeal -- Pharyngeal- Thin Straw Reduced pharyngeal peristalsis Pharyngeal -- Pharyngeal- Puree Delayed swallow initiation-vallecula;Reduced pharyngeal peristalsis Pharyngeal -- Pharyngeal- Mechanical Soft -- Pharyngeal -- Pharyngeal- Regular Delayed swallow initiation-vallecula;Reduced pharyngeal peristalsis Pharyngeal -- Pharyngeal- Multi-consistency -- Pharyngeal -- Pharyngeal- Pill -- Pharyngeal -- Pharyngeal Comment --  CHL IP CERVICAL ESOPHAGEAL PHASE 09/22/2016 Cervical Esophageal Phase Impaired Pudding Teaspoon -- Pudding Cup -- Honey Teaspoon -- Honey Cup -- Nectar Teaspoon -- Nectar Cup Esophageal backflow into cervical esophagus;Esophageal backflow into the pharynx Nectar Straw -- Thin Teaspoon -- Thin Cup Esophageal backflow into cervical esophagus Thin Straw Esophageal backflow into cervical esophagus Puree Esophageal backflow into cervical esophagus Mechanical Soft -- Regular Esophageal backflow into the pharynx;Esophageal backflow  into cervical esophagus Multi-consistency -- Pill -- Cervical Esophageal Comment -- Deneise Lever, MS, CCC-SLP Speech-Language Pathologist [phone] No flowsheet data found. Aliene Altes 09/22/2016, 3:44 PM               Time Spent in minutes 35   Louellen Molder M.D on 10/03/2016 at 11:38 AM  Between 7am to 7pm - Pager - [phone]  After 7pm go to [email or web address] - password [facility]  [facility] -  Office  [phone]

## 2016-10-03 NOTE — Progress Notes (Signed)
Discussed with nursing and they will remove Flexi-seal today as recommended by Dr. Silverio Decamp.  Again please call if you need [place] for further recommendations.  Ellouise Newer, PA-C [facility]

## 2016-10-03 NOTE — Progress Notes (Signed)
SLP Cancellation Note  Patient Details Name: Casey [name] MRN: [id number] DOB: 06-Feb-1940   Cancelled treatment:       Reason Eval/Treat Not Completed: Other (comment) (slp follow up to educate family re: oral care importance and aspiration risk, no family present at this time)   Claudie Fisherman, [facility] SLP [phone]

## 2016-10-04 DIAGNOSIS — N170 Acute kidney failure with tubular necrosis: Secondary | ICD-10-CM

## 2016-10-04 DIAGNOSIS — N183 Chronic kidney disease, stage 3 (moderate): Secondary | ICD-10-CM

## 2016-10-04 LAB — PREPARE RBC (CROSSMATCH)

## 2016-10-04 LAB — TYPE AND SCREEN
ABO/RH(D): B POS
Antibody Screen: NEGATIVE
UNIT DIVISION: 0
Unit division: 0
Unit division: 0
Unit division: 0
Unit division: 0
Unit division: 0
Unit division: 0

## 2016-10-04 LAB — BPAM RBC
BLOOD PRODUCT EXPIRATION DATE: 201809212359
BLOOD PRODUCT EXPIRATION DATE: 201809212359
Blood Product Expiration Date: 201809082359
Blood Product Expiration Date: 201809192359
Blood Product Expiration Date: 201809192359
Blood Product Expiration Date: 201809212359
Blood Product Expiration Date: 201809212359
ISSUE DATE / TIME: 201809021346
ISSUE DATE / TIME: 201809021644
ISSUE DATE / TIME: 201809031237
ISSUE DATE / TIME: 201809040637
ISSUE DATE / TIME: 201809041019
UNIT TYPE AND RH: 7300
UNIT TYPE AND RH: 7300
UNIT TYPE AND RH: 7300
Unit Type and Rh: 7300
Unit Type and Rh: 7300
Unit Type and Rh: 7300
Unit Type and Rh: 7300

## 2016-10-04 LAB — MAGNESIUM: Magnesium: 2.3 mg/dL (ref 1.7–2.4)

## 2016-10-04 LAB — CBC
HEMATOCRIT: 19.4 % — AB (ref 36.0–46.0)
Hemoglobin: 6.5 g/dL — CL (ref 12.0–15.0)
MCH: 28.6 pg (ref 26.0–34.0)
MCHC: 33.5 g/dL (ref 30.0–36.0)
MCV: 85.5 fL (ref 78.0–100.0)
PLATELETS: 67 10*3/uL — AB (ref 150–400)
RBC: 2.27 MIL/uL — AB (ref 3.87–5.11)
RDW: 17.6 % — ABNORMAL HIGH (ref 11.5–15.5)
WBC: 11.1 10*3/uL — ABNORMAL HIGH (ref 4.0–10.5)

## 2016-10-04 LAB — GLUCOSE, CAPILLARY
GLUCOSE-CAPILLARY: 115 mg/dL — AB (ref 65–99)
GLUCOSE-CAPILLARY: 141 mg/dL — AB (ref 65–99)
Glucose-Capillary: 150 mg/dL — ABNORMAL HIGH (ref 65–99)
Glucose-Capillary: 137 mg/dL — ABNORMAL HIGH (ref 65–99)
Glucose-Capillary: 130 mg/dL — ABNORMAL HIGH (ref 65–99)
Glucose-Capillary: 139 mg/dL — ABNORMAL HIGH (ref 65–99)

## 2016-10-04 LAB — BASIC METABOLIC PANEL
Anion gap: 8 (ref 5–15)
BUN: 70 mg/dL — AB (ref 6–20)
CALCIUM: 9.3 mg/dL (ref 8.9–10.3)
CHLORIDE: 118 mmol/L — AB (ref 101–111)
CO2: 18 mmol/L — AB (ref 22–32)
CREATININE: 2.32 mg/dL — AB (ref 0.44–1.00)
GFR calc non Af Amer: 19 mL/min — ABNORMAL LOW (ref >60)
GFR, EST AFRICAN AMERICAN: 22 mL/min — AB (ref >60)
Glucose, Bld: 151 mg/dL — ABNORMAL HIGH (ref 65–99)
Potassium: 4.4 mmol/L (ref 3.5–5.1)
Sodium: 144 mmol/L (ref 135–145)

## 2016-10-04 LAB — PROTIME-INR
INR: 1.25
PROTHROMBIN TIME: 15.6 s — AB (ref 11.4–15.2)

## 2016-10-04 MED ORDER — ALTEPLASE 2 MG IJ SOLR
2.0000 mg | Freq: Once | INTRAMUSCULAR | Status: AC
  Administered 2016-10-04: 2 mg
  Filled 2016-10-04: qty 2

## 2016-10-04 MED ORDER — CHLORHEXIDINE GLUCONATE CLOTH 2 % EX PADS
6.0000 | MEDICATED_PAD | Freq: Every day | CUTANEOUS | Status: DC
  Administered 2016-10-04 – 2016-10-05 (×2): 6 via TOPICAL

## 2016-10-04 MED ORDER — SODIUM CHLORIDE 0.9 % IV SOLN
Freq: Once | INTRAVENOUS | Status: DC
Start: 2016-10-04 — End: 2016-10-05

## 2016-10-04 MED ORDER — SODIUM CHLORIDE 0.9 % IV SOLN
Freq: Once | INTRAVENOUS | Status: AC
  Administered 2016-10-04: 12:00:00 via INTRAVENOUS

## 2016-10-04 NOTE — Progress Notes (Signed)
Nutrition Follow-up  DOCUMENTATION CODES:   Severe malnutrition in context of acute illness/injury  INTERVENTION:  - Will monitor for POC concerning PEG placement.  NUTRITION DIAGNOSIS:   Malnutrition (severe) related to acute illness, wound healing (abdominal wound) as evidenced by moderate depletion of body fat, severe depletion of muscle mass. -ongoing  GOAL:   Patient will meet greater than or equal to 90% of their needs -unable to meet  MONITOR:   Weight trends, Labs, Skin, I & O's, Other (Comment) (PEG placement and re-start of TF)  ASSESSMENT:   76 year old woman with dementia, bedbound for 3 years, recent admission to [facility] for infected groin wound with staph following pseudoaneurysm repair admitted 8/17 with fevers, shortness of breath and cough, chest x-ray showing multifocal bilateral patchy infiltrates and atrial fibrillation/RVR. She was hypoxic requiring 2 L. She was started on Cardizem drip and became hypotensive and hence this was stopped. Developed respiratory distress and was emergently intubated in the ED  9/6 Pt remains NPO and PEG placement has been on hold since anticipated placement date of 9/4 d/t comorbidities. Palliative Care continues to follow and have discussions with pt's daughter concerning code status and POC. Daughter wishes for pt to remain full code and states understanding of ongoing decline and likely inability to have a meaningful recover; she still desires for pt to receive PEG or nutrition in some capacity. Weight +4 lbs/2 kg since previous assessment.  Medications reviewed; sliding scale Novolog, 4 g IV Mg sulfate x1 dose yesterday, 40 mg IV Protonix BID, 10 mEq IV KCl x3 runs yesterday. Labs reviewed; CBGs: 150, 137, and 130 mg/dL today, BUN: 70 mg/dL, creatinine: 2.32 mg/dL, GFR: 22 mL/min.   IVF: D5-NS @ 100 mL/hr (408 kcal from dextrose).     9/3 - Order in place for IR with planned PEG placement tomorrow (9/4). - Noted RN note from  this AM stating pt with critical INR of 4.08. - When pt had Panda tube, she was receiving Jevity 1.2 @ 40 mL/hr with 30 mL Prostat BID and 100 mL free water every 4 hours.  - This regimen was providing 1352 kcal, 83 grams of protein, and 1375 mL free water.  - Weight fluctuating stable since 8/29.  Na: 158 mmol/L, Cl: 126 mmol/L IVF: D5 @ 50 mL/hr (204 kcal from dextrose).   8/31 - Pt remains NPO.  - Dr. Domingo Cocking talked with pt's children yesterday evening and they would like pt to remain full code and to proceed with PEG placement.  - No date listed for planned PEG placement. - Will order TF after PEG is placed; no TF order in place at this time. - Current weight is -3 lbs/1.5 kg since assessment on 8/27.  Na: 152 mmol/L, Cl: 113 mmol/L   Diet Order:  Diet NPO time specified Except for: Sips with Meds  Skin:   (abdominal and groin incisions)  Last BM:  9/4  Height:   Ht Readings from Last 1 Encounters:  09/14/16 5\' 3"  (1.6 m)    Weight:   Wt Readings from Last 1 Encounters:  10/04/16 126 lb 15.8 oz (57.6 kg)    Ideal Body Weight:  52.3 kg  BMI:  Body mass index is 22.49 kg/m.  Estimated Nutritional Needs:   Kcal:  1250-1450  Protein:  75-85g  Fluid:  1.5L/day  EDUCATION NEEDS:   No education needs identified at this time    Jarome Matin, MS, RD, LDN, CNSC Inpatient Clinical Dietitian Pager # [phone] After hours/weekend  pager # [phone]

## 2016-10-04 NOTE — Progress Notes (Signed)
PROGRESS NOTE                                                                                                                                                                                                             Patient Demographics:    Casey [name], is a 76 y.o. female, DOB - 1940/06/13, QAS:[id number]  Admit date - 09/14/2016   Admitting Physician Collene Gobble, MD  Outpatient Primary MD for the patient is Gildardo Cranker, DO  LOS - 20  Outpatient Specialists: none  Chief Complaint  Patient presents with  . Headache  . general pain       Brief Narrative   76 year old female with history of CVA, seizure disorder, dementia, chronic kidney disease stage III, hypertension, chronic systolic CHF, malnutrition, anemia of chronic disease, diabetes mellitus type 2 with nephropathy presented to the ED with fever and myalgias. She was found to have healthcare associated pneumonia. She subsequently developed respiratory failure requiring intubation. Hospital course prolonged with development of A. fib requiring Cardizem drip. She also developed acute systolic CHF requiring diuresis. Patient required NG tube for feeding which she reported out and has been now planned for a PEG tube placement. Hospital course further complicated with lower GI bleed with blood loss anemia requiring transfusion. Also had persistent renal insufficiency and coagulopathy.   Subjective:   Communicates poorly. No overnight events.  Assessment  & Plan :   Principal problem Atrial fibrillation with RVR CHA2DS2-VASc Score8, not on anticoagulation due to acute blood loss anemia. -d/c cardizem drip. HR stable on monitor. -Continue metoprolol.  Acute on chronic systolic CHF EF of 79-89% with diffuse hypokinesis. Lasix held due to worsening renal failure. Monitor I/O.  Acute hypoxic respiratory failure secondary to healthcare associated  pneumonia Required ventilator support from 8/17-8/22. Now complicated days of antibiotics. Tracheal aspirate negative for growth.  Acute blood loss anemia Hemoglobin dropped to 4.2 on 9/2 and has received a total of 5 units PRBC since then. Continue PPI. GI was consulted who recommended given her acute comorbidities she is unlikely to tolerate GI procedure or anesthesia. Recommended palliative care. Hemoglobin again dropped to 6.8 today. Ordered 1 unit PRBC.  Yeast  UTI Worsening leukocytosis after completion of antibiotics. Chest x-ray negative. Was on empiric cefepime for UTI. Antibiotic discontinued and started on IV fluconazole.  Acute on chronic  kidney disease stage III Possibly due to dehydration and Lasix which is held. Renal function slowly improving. Continue to monitor.  Coagulopathy INR improved after receiving vitamin k .  NSVT Replace potassium and magnesium aggressively.  Hypokalemia/hypomagnesemia Replaced  Severe protein calorie malnutrition/failure to thrive/Dysphagia  IR consulted for feeding tube placement, being held for GI bleed, UTI and coagulopathy. Patient remains high aspiration risk even after feeding tube is placed. Seen by SLP and plan on MBS.   Hypernatremia Secondary to dehydration. Improving with D5.   Diabetes mellitus type 2 with nephropathy Monitor on sliding scale coverage.   seizure disorder Continue Keppra  History of CVA with ?Vascular dementia Does not have capacity to make medical decision.   Goals of care Palliative care involved and had goals of care discussion on 9/3. Patient is unable to participate in discussion and GOC was discussed with patient's son and daughter on the phone. They wished on continuing aggressive care including placement of feeding tube. Discussed at length with patient's daughter at bedside by palliative care and myself. She is still interested in patient getting a feeding tube and maintaining full scope of  treatment.    Code Status :Full code, palliative care involved in goals of care discussion.  Family Communication  : Discussed with daughter at bedside on 9/5.  Disposition Plan  : Pending clinical improvement and hospital course. Transfer to telemetry this afternoon.  Barriers For Discharge : Active symptoms  Consults  :   IR GI PC CM Palliative care   Procedures  :  2-D echo  DVT Prophylaxis  : SCDs  Lab Results  Component Value Date   PLT 67 (L) 10/04/2016    Antibiotics  :    Anti-infectives    Start     Dose/Rate Route Frequency Ordered Stop   10/03/16 1000  fluconazole (DIFLUCAN) IVPB 100 mg     100 mg 50 mL/hr over 60 Minutes Intravenous Every 24 hours 10/03/16 0838     10/02/16 1000  ceFEPIme (MAXIPIME) 500 mg in dextrose 5 % 50 mL IVPB  Status:  Discontinued     500 mg 100 mL/hr over 30 Minutes Intravenous Every 24 hours 10/01/16 1538 10/03/16 0838   10/02/16 0800  vancomycin (VANCOCIN) IVPB 1000 mg/200 mL premix     1,000 mg 200 mL/hr over 60 Minutes Intravenous To Radiology 09/28/16 1525 10/02/16 0905   10/01/16 1200  ciprofloxacin (CIPRO) IVPB 400 mg  Status:  Discontinued     400 mg 200 mL/hr over 60 Minutes Intravenous Every 24 hours 10/01/16 1018 10/01/16 1538   09/15/16 0600  vancomycin (VANCOCIN) IVPB 1000 mg/200 mL premix  Status:  Discontinued     1,000 mg 200 mL/hr over 60 Minutes Intravenous Every 24 hours 09/14/16 1100 09/17/16 0908   09/15/16 0000  vancomycin (VANCOCIN) IVPB 1000 mg/200 mL premix  Status:  Discontinued     1,000 mg 200 mL/hr over 60 Minutes Intravenous  Once 09/14/16 0732 09/14/16 1100   09/14/16 1200  ceFEPIme (MAXIPIME) 2 g in dextrose 5 % 50 mL IVPB     2 g 100 mL/hr over 30 Minutes Intravenous Every 24 hours 09/14/16 1100 09/20/16 2359   09/14/16 0745  piperacillin-tazobactam (ZOSYN) IVPB 3.375 g  Status:  Discontinued     3.375 g 100 mL/hr over 30 Minutes Intravenous Every 6 hours 09/14/16 0732 09/14/16 1057    09/14/16 0600  vancomycin (VANCOCIN) IVPB 1000 mg/200 mL premix     1,000 mg 200 mL/hr over  60 Minutes Intravenous  Once 09/14/16 0554 09/14/16 0819   09/14/16 0600  piperacillin-tazobactam (ZOSYN) IVPB 3.375 g     3.375 g 100 mL/hr over 30 Minutes Intravenous  Once 09/14/16 0554 09/14/16 0819        Objective:   Vitals:   10/04/16 0600 10/04/16 0800 10/04/16 1215 10/04/16 1227  BP: (!) 118/36   (!) 150/68  Pulse:      Resp: (!) 26  (!) 28 (!) 29  Temp:  97.9 F (36.6 C)  (!) 97.4 F (36.3 C)  TempSrc:  Oral  Oral  SpO2: 100%  100% 96%  Weight:      Height:        Wt Readings from Last 3 Encounters:  10/04/16 57.6 kg (126 lb 15.8 oz)  09/13/16 56 kg (123 lb 8 oz)  09/10/16 56 kg (123 lb 8 oz)     Intake/Output Summary (Last 24 hours) at 10/04/16 1248 Last data filed at 10/04/16 0000  Gross per 24 hour  Intake           1014.5 ml  Output              650 ml  Net            364.5 ml     Physical Exam Gen.: Elderly female frail, poorly communicative, nondistended in distress HEENT: Pallor present, temporal wasting, supple neck, poor dentition Chest: Clear bilaterally CVS: S1 and S2 regular, no murmurs GI: Soft, nondistended, nontender, bowel sounds present, Foley + Musculoskeletal: Warm, no edema, left arm PICC CNS: Sleepy but arousable and answers to simple commands.   Data Review:    CBC  Recent Labs Lab 10/01/16 0337 10/02/16 0352 10/02/16 1528 10/03/16 0423 10/04/16 0456  WBC 25.6* 20.7* 19.1* 14.7* 11.1*  HGB 7.6* 5.9* 7.8* 7.3* 6.5*  HCT 22.1* 17.8* 22.5* 21.0* 19.4*  PLT 171 110* 91* 82* 67*  MCV 87.4 82.8 84.0 83.3 85.5  MCH 30.0 27.4 29.1 29.0 28.6  MCHC 34.4 33.1 34.7 34.8 33.5  RDW 16.2* 18.1* 16.3* 16.6* 17.6*    Chemistries   Recent Labs Lab 09/30/16 1040 10/01/16 0337 10/02/16 0352 10/03/16 0423 10/04/16 0812  NA 162* 158* 151* 147* 144  K 4.6 4.8 3.9 3.2* 4.4  CL 125* 126* 121* 120* 118*  CO2 24 21* 21* 20* 18*  GLUCOSE  189* 233* 278* 209* 151*  BUN 98* 111* 117* 104* 70*  CREATININE 3.34* 4.41* 4.39* 3.29* 2.32*  CALCIUM 9.6 9.5 9.1 9.0 9.3  MG  --   --   --  0.6* 2.3  AST  --   --   --  19  --   ALT  --   --   --  11*  --   ALKPHOS  --   --   --  36*  --   BILITOT  --   --   --  0.8  --    ------------------------------------------------------------------------------------------------------------------ No results for input(s): CHOL, HDL, LDLCALC, TRIG, CHOLHDL, LDLDIRECT in the last 72 hours.  Lab Results  Component Value Date   HGBA1C 6.6 (H) 08/30/2016   ------------------------------------------------------------------------------------------------------------------ No results for input(s): TSH, T4TOTAL, T3FREE, THYROIDAB in the last 72 hours.  Invalid input(s): FREET3 ------------------------------------------------------------------------------------------------------------------ No results for input(s): VITAMINB12, FOLATE, FERRITIN, TIBC, IRON, RETICCTPCT in the last 72 hours.  Coagulation profile  Recent Labs Lab 09/29/16 0544 10/01/16 0337 10/02/16 0352 10/03/16 0423 10/04/16 0456  INR 3.16 4.08* 4.03* 1.38 1.25  No results for input(s): DDIMER in the last 72 hours.  Cardiac Enzymes  Recent Labs Lab 10/02/16 1738  TROPONINI 0.04*   ------------------------------------------------------------------------------------------------------------------    Component Value Date/Time   BNP 932.9 (H) 09/14/2016 1050    Inpatient Medications  Scheduled Meds: . bacitracin  1 application Topical BID  . chlorhexidine gluconate (MEDLINE KIT)  15 mL Mouth Rinse BID  . Chlorhexidine Gluconate Cloth  6 each Topical Daily  . insulin aspart  0-15 Units Subcutaneous Q4H  . mouth rinse  15 mL Mouth Rinse QID  . metoprolol tartrate  5 mg Intravenous Q6H  . pantoprazole (PROTONIX) IV  40 mg Intravenous Q12H  . sodium chloride flush  10-40 mL Intracatheter Q12H   Continuous  Infusions: . sodium chloride    . dextrose 5 % 1,000 mL with potassium chloride 40 mEq infusion 75 mL/hr at 10/04/16 0323  . diltiazem (CARDIZEM) infusion 3 mg/hr (10/03/16 1600)  . fluconazole (DIFLUCAN) IV Stopped (10/04/16 1130)  . levETIRAcetam Stopped (10/04/16 1026)   PRN Meds:.acetaminophen, bisacodyl, ipratropium-albuterol, metoprolol tartrate, morphine injection, ondansetron (ZOFRAN) IV, sodium chloride flush  Micro Results Recent Results (from the past 240 hour(s))  Culture, Urine     Status: Abnormal   Collection Time: 09/30/16  6:03 PM  Result Value Ref Range Status   Specimen Description URINE, CATHETERIZED  Final   Special Requests NONE  Final   Culture >=100,000 COLONIES/mL YEAST (A)  Final   Report Status 10/02/2016 FINAL  Final    Radiology Reports Dg Chest 2 View  Result Date: 09/14/2016 CLINICAL DATA:  Generalized body aches, atrial fibrillation. Sepsis. History of lymphoma. EXAM: CHEST  2 VIEW COMPARISON:  Chest radiograph September 07, 2016 FINDINGS: New patchy airspace opacities RIGHT lung and LEFT lung base. No pleural effusion. Stable cardiomegaly. Fullness of the RIGHT greater than LEFT hila is unchanged. Calcified aortic knob. Similar chronic interstitial changes. No pneumothorax. Soft tissue planes and included osseous structures are nonsuspicious. IMPRESSION: New bilateral patchy airspace opacities concerning for pneumonia. Followup PA and lateral chest X-ray is recommended in 3-4 weeks following trial of antibiotic therapy to ensure resolution and exclude underlying malignancy. Stable cardiomegaly and chronic interstitial changes. Stable fullness of the hila concerning for lymphadenopathy given patient's history of lymphoma. Electronically Signed   By: Elon Alas M.D.   On: 09/14/2016 04:49   Dg Chest 2 View  Result Date: 09/07/2016 CLINICAL DATA:  Tachycardia today. EXAM: CHEST  2 VIEW COMPARISON:  CT chest 01/10/2015.  PA and lateral chest 12/06/2011.  FINDINGS: There is cardiomegaly without edema. Fullness of the right hilum consistent with lymphadenopathy as seen on the prior CT scan is unchanged. The lungs are clear. There is no pneumothorax. Small bilateral pleural effusions are seen. Aortic atherosclerosis is noted. IMPRESSION: Small bilateral pleural effusions. Cardiomegaly without edema. Atherosclerosis. Fullness of the right hilum consistent with lymphadenopathy as seen on prior CT scan is unchanged. Electronically Signed   By: Inge Rise M.D.   On: 09/07/2016 12:17   Dg Abd 1 View  Result Date: 09/21/2016 CLINICAL DATA:  Nasogastric tube placement. EXAM: ABDOMEN - 1 VIEW COMPARISON:  Yesterday. FINDINGS: Feeding tube tip with its tip in scratch sec peak 2 tip in the mid stomach. No a nasogastric tube is seen. Normal bowel gas pattern. Left iliac stent. Lumbar spine degenerative changes. Small to moderate-sized right pleural effusion, increased. Interval small left pleural effusion. IMPRESSION: 1. Feeding tube tip in the mid stomach. 2. No nasogastric tube seen. 3. Small moderate-sized  right pleural effusion and small left pleural effusion. Electronically Signed   By: Claudie Revering M.D.   On: 09/21/2016 20:49   Dg Abd 1 View  Result Date: 09/20/2016 CLINICAL DATA:  Evaluate NG tube EXAM: ABDOMEN - 1 VIEW COMPARISON:  Abdomen radiograph 09/19/2016 FINDINGS: Enteric tube tip projects over the left upper quadrant, feeding tube projects over the stomach. Mildly gaseous distended stomach. Nonobstructed bowel gas pattern. Lumbar spine degenerative changes. Cardiomegaly. Basilar atelectasis. Stent projects over the left hemipelvis. Bilateral lower abdominal surgical clips. IMPRESSION: Feeding tube within the mid stomach. Electronically Signed   By: Lovey Newcomer M.D.   On: 09/20/2016 15:38   Dg Abd 1 View  Result Date: 09/19/2016 CLINICAL DATA:  Pain and a placement EXAM: ABDOMEN - 1 VIEW COMPARISON:  09/14/2016 FINDINGS: Soft feeding tube enters  the abdomen with its tip in the midportion of the stomach. Bowel gas pattern appears normal. IMPRESSION: Feeding tube tip in the midportion of the stomach. Electronically Signed   By: Nelson Chimes M.D.   On: 09/19/2016 11:17   Dg Abd 1 View  Result Date: 09/14/2016 CLINICAL DATA:  OG tube placement EXAM: ABDOMEN - 1 VIEW COMPARISON:  09/14/2016 FINDINGS: OG tube tip is in the distal stomach. Nonobstructive bowel gas pattern. IMPRESSION: OG tube tip in the distal stomach. Electronically Signed   By: Rolm Baptise M.D.   On: 09/14/2016 11:04   Ct Angio Ao+bifem W & Or Wo Contrast  Result Date: 09/07/2016 CLINICAL DATA:  Status post repair of left femoral pseudoaneurysm with revision of right the left femoral- femoral bypass graft and left femoral-popliteal bypass graft. There has been some oozing of blood from the left groin operative site. EXAM: CT ANGIOGRAPHY OF ABDOMINAL AORTA WITH ILIOFEMORAL RUNOFF TECHNIQUE: Multidetector CT imaging of the abdomen, pelvis and lower extremities was performed using the standard protocol during bolus administration of intravenous contrast. Multiplanar CT image reconstructions and MIPs were obtained to evaluate the vascular anatomy. CONTRAST:  100 mL Isovue 370 IV COMPARISON:  CT of the abdomen and pelvis on 08/29/2016 FINDINGS: VASCULAR Aorta: The abdominal aorta and shows stable patency and atherosclerotic plaque. No evidence of aneurysmal disease or significant aortic stenosis. Celiac: Calcified plaque causes approximately 40- 50% origin stenosis. SMA: Calcified plaque causes approximately 50% origin stenosis. Renals: Bilateral single renal arteries without significant stenosis. IMA: Origin patent. The proximal trunk shows heavily calcified plaque without occlusion. RIGHT Lower Extremity Inflow: Calcified common iliac artery without significant stenosis. Internal and external iliac arteries show diffuse disease. Maximal narrowing of the proximal external iliac artery  approaches 50%. Outflow: Calcified plaque causes focal narrowing of the common femoral artery just prior to the femoral-femoral bypass graft. The artery is narrowed approximately 60- 70%. The femoral-femoral graft is patent but is poorly opacified distally in the left groin. There is some thickening around the distal graft extending superiorly and abutting the abdominal wall. There likely is some postoperative hemorrhage in this region as well as a few foci of air. No findings to suggest focal abscess. 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 Profunda femoral artery is patent. At the level of the popliteal artery, there is poor arterial opacification and popliteal patency cannot be adequately assessed. Runoff: Patency of runoff below the right knee cannot be adequately assessed, due to poor opacification of distal vessels. 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 Internal and external iliac arteries are also chronically occluded. At the  level of the left groin, the distal aspect of the femoral- femoral bypass graft is poorly opacified and there is evidence now of a new synthetic femoral-popliteal bypass graft which is poorly opacified. Outflow: Unable to assess distal bypass graft and native popliteal artery patency. Runoff: Unable to assess runoff artery patency. Review of the MIP images confirms the above findings. NON-VASCULAR Lower chest: Bibasilar scarring and small bilateral pleural effusions, right greater than left. Hepatobiliary: No focal liver abnormality is seen. No gallstones, gallbladder wall thickening, or biliary dilatation. Pancreas: Unremarkable. No pancreatic ductal dilatation or surrounding inflammatory changes. Spleen: Normal in size without focal abnormality. Adrenals/Urinary Tract: Adrenal glands are unremarkable. Stable nonobstructing right renal calculi. Stable lower pole renal cyst on the left.  Stable small right-sided bladder calculus. Stomach/Bowel: No evidence of bowel obstruction or ileus. There is a fairly large amount of fecal material located in the rectum. No free air. Lymphatic: No enlarged lymph nodes identified. Reproductive: Uterus demonstrates calcified degenerated fibroids. No adnexal masses. Other: No free fluid or hernias. Musculoskeletal: Degenerative disc disease at L5-S1. IMPRESSION: VASCULAR 1. Poorly opacified distal segment of the right to left femoral- femoral bypass graft and poorly opacified new left femoral to popliteal bypass graft. Although some of this is felt to relate to contrast timing, there would be some concern of the potential for imminent thrombosis and vascular surgical assessment is recommended. The previously identified left groin pseudoaneurysm has been resected and repaired and there is no evidence of recurrent pseudoaneurysm or active contrast extravasation. Some postoperative changes are evident around the graft likely representing some hemorrhage that extends superiorly abutting the abdominal wall. 2. The native right common femoral artery does demonstrate significant disease and focal moderate stenosis just proximal to the bypass graft anastomosis which could account for some potential inflow restriction into the femoral-femoral graft. 3. Poor opacification at the level of bilateral popliteal and tibial arteries on the CTA results and lack of accurate patency assessment of these vessels. NON-VASCULAR 1. Stable nonobstructing right renal calculi and bladder calculus. 2. Fairly large amount of stool is present in the rectum. Electronically Signed   By: Aletta Edouard M.D.   On: 09/07/2016 16:06   Dg Chest Port 1 View  Result Date: 09/30/2016 CLINICAL DATA:  SOb today EXAM: PORTABLE CHEST - 1 VIEW COMPARISON:  09/24/2016 FINDINGS: Lungs are clear.  Left arm PICC line to the distal SVC as before. Heart size and mediastinal contours are within normal limits.  Atheromatous aorta. No effusion.  No pneumothorax. Visualized bones unremarkable. IMPRESSION: No acute cardiopulmonary disease. Aortic Atherosclerosis (ICD10-170.0) Electronically Signed   By: Lucrezia Europe M.D.   On: 09/30/2016 09:22   Dg Chest Port 1 View  Result Date: 09/24/2016 CLINICAL DATA:  Shortness breath, CHF EXAM: PORTABLE CHEST 1 VIEW COMPARISON:  09/22/2016 FINDINGS: Left PICC line remains in place, unchanged. Cardiomegaly with vascular congestion, improving since prior study. Improving aeration in the lung bases. No overt edema, effusions or confluent opacities. IMPRESSION: Cardiomegaly, vascular congestion. Vascular congestion has improved since prior study with improving aeration in the lung bases. Electronically Signed   By: Rolm Baptise M.D.   On: 09/24/2016 07:49   Dg Chest Port 1 View  Result Date: 09/22/2016 CLINICAL DATA:  Acute respiratory failure with hypoxia Hx CHF, diabetes, HTN, former smoker. EXAM: PORTABLE CHEST - 1 VIEW COMPARISON:  the previous day's study FINDINGS: Feeding tube and left arm PICC stable in position. Perihilar and bibasilar infiltrates or edema left greater than right, slightly improved  since previous exam. Stable cardiomegaly.  Atheromatous aorta. Small right pleural effusion as before. Visualized bones unremarkable. IMPRESSION: 1. Asymmetric infiltrates or edema, slightly improved since previous exam. 2. Stable cardiomegaly and small right effusion. 3.  Support hardware stable in position. Electronically Signed   By: Lucrezia Europe M.D.   On: 09/22/2016 11:19   Dg Chest Port 1 View  Result Date: 09/21/2016 CLINICAL DATA:  Pneumonia. EXAM: PORTABLE CHEST 1 VIEW COMPARISON:  09/20/2016. FINDINGS: Feeding tube, left PICC line in stable position. Cardiomegaly with mild bilateral interstitial prominence and pleural effusions consistent with congestive heart failure. Similar findings on prior exam. Basilar atelectasis. No pneumothorax. IMPRESSION: 1. Lines and tubes in  stable position. 2. Cardiomegaly with diffuse mild bilateral interstitial prominence and bilateral pleural effusions consistent with congestive heart failure. Similar findings noted on prior exam. 3. Basilar atelectasis . Electronically Signed   By: Marcello Moores  Register   On: 09/21/2016 06:23   Dg Chest Port 1 View  Result Date: 09/20/2016 CLINICAL DATA:  Dyspnea EXAM: PORTABLE CHEST 1 VIEW COMPARISON:  09/19/2016 FINDINGS: Multifocal perihilar and bilateral lower lobe opacities, suspicious for moderate interstitial edema, less likely multifocal pneumonia. Associated small bilateral pleural effusions, right greater than left. Overall appearance is likely mildly progressed. Cardiomegaly. Left arm PICC terminates at the cavoatrial junction. Prior enteric tube has been replaced with a feeding tube which courses into the stomach. IMPRESSION: Suspected moderate interstitial edema, mildly progressed. Small bilateral pleural effusions, right greater than left. Electronically Signed   By: Julian Hy M.D.   On: 09/20/2016 23:06   Dg Chest Port 1 View  Result Date: 09/19/2016 CLINICAL DATA:  Respiratory failure. EXAM: PORTABLE CHEST 1 VIEW COMPARISON:  09/18/2016. FINDINGS: Endotracheal tube, NG tube, left PICC line stable position . Cardiomegaly with slight increase in basilar infiltrates and small pleural effusions consistent with slight worsening of CHF. No pneumothorax. IMPRESSION: 1.  Lines and tubes in stable position. 2. Cardiomegaly with slight increase in basilar infiltrates and small pleural effusions consistent with slight worsening of CHF. Electronically Signed   By: Marcello Moores  Register   On: 09/19/2016 06:21   Dg Chest Port 1 View  Result Date: 09/18/2016 CLINICAL DATA:  PICC line placement. EXAM: PORTABLE CHEST 1 VIEW COMPARISON:  09/18/2016. FINDINGS: PICC line noted with its tip projected over the superior vena cava. Endotracheal tube and NG tube in stable position. Cardiomegaly with bilateral mild  interstitial prominence and small pleural effusions consistent CHF. Mild basilar atelectasis.Slight improvement from prior exam. IMPRESSION: 1.  Left PICC line in stable position. 2.  Endotracheal tube and NG tube in stable position. 3.  Mild from prior exam . Electronically Signed   By: Marcello Moores  Register   On: 09/18/2016 12:54   Dg Chest Port 1 View  Result Date: 09/18/2016 CLINICAL DATA:  Respiratory failure, history of lymphoma, CHF, peripheral vascular disease, current smoker. EXAM: PORTABLE CHEST 1 VIEW COMPARISON:  Portable chest x-ray of September 17, 2016 FINDINGS: The trachea remains intubated with the tip of the tube lying 2.5 cm above the carina. The esophagogastric tube tip projects below the inferior margin of the image. The lungs are well-expanded. The lung markings are coarse in the lower lobes. A small right pleural effusion likely layers posteriorly. The cardiac silhouette remains enlarged. The pulmonary vascularity is minimally prominent. There is calcification in the wall of the thoracic aorta. IMPRESSION: COPD. Probable subsegmental atelectasis or infiltrate in the lower lobes without discrete air bronchograms. Reasonable positioning of the endotracheal tube. Mild cardiomegaly  with mild pulmonary vascular congestion. Thoracic aortic atherosclerosis. Electronically Signed   By: David  Martinique M.D.   On: 09/18/2016 07:09   Dg Chest Port 1 View  Result Date: 09/17/2016 CLINICAL DATA:  Respiratory failure. EXAM: PORTABLE CHEST 1 VIEW COMPARISON:  09/16/2016. FINDINGS: Endotracheal tube tip 1.4 cm above the carina. Retraction of approximately 2 cm should be considered. NG tube noted in stable position. Cardiomegaly with diffuse mild bilateral interstitial prominence and small bilateral pleural effusions suggesting CHF again noted. Similar findings noted on prior exam . Improved basilar atelectasis. No pneumothorax. IMPRESSION: 1. Endotracheal tube tip 1.4 cm above the carina. Retraction of  approximately 2 cm should be considered. NG tube noted stable position. 2. Cardiomegaly with diffuse mild bilateral interstitial prominence and small bilateral pleural effusions suggesting CHF again noted. Similar findings noted on prior exam. 3. Improved bibasilar atelectasis. Electronically Signed   By: Marcello Moores  Register   On: 09/17/2016 06:48   Dg Chest Port 1 View  Result Date: 09/16/2016 CLINICAL DATA:  Acute respiratory failure EXAM: PORTABLE CHEST 1 VIEW COMPARISON:  Two days ago FINDINGS: Endotracheal tube tip between the clavicular heads and carina. An orogastric tube has been advanced to the stomach at least. Cardiomegaly. Partial visualization of the left diaphragm. Haziness of the lower chest is likely from atelectasis and pleural effusions. Atelectasis about the right minor fissure and patchy primarily perihilar opacity. Interstitial coarsening is improved. No pneumothorax. IMPRESSION: 1. Unremarkable positioning of tubes and central line. 2. Interstitial coarsening is improved, likely decreasing edema. 3. Patchy bilateral airspace opacity from residual airspace edema or pneumonia. Progressive atelectasis at the left base with volume loss. Electronically Signed   By: Monte Fantasia M.D.   On: 09/16/2016 07:02   Dg Chest Portable 1 View  Result Date: 09/14/2016 CLINICAL DATA:  Intubated.  OG tube placement. EXAM: PORTABLE CHEST 1 VIEW COMPARISON:  09/14/2016 FINDINGS: Endotracheal tube is 4 cm above the carina. OG tube tip is in the midesophagus. Cardiomegaly. Patchy bilateral airspace opacities, right greater than left are similar to prior study. No effusions. IMPRESSION: Endotracheal tube 4 cm above the carina. OG tube tip in the midthoracic esophagus. Recommend advancing. Stable patchy bilateral airspace opacities, right greater than left. Electronically Signed   By: Rolm Baptise M.D.   On: 09/14/2016 09:19   Dg Abd Portable 1v  Result Date: 09/22/2016 CLINICAL DATA:  Check feeding  catheter placement EXAM: PORTABLE ABDOMEN - 1 VIEW COMPARISON:  None. FINDINGS: Feeding catheter is noted in the distal stomach. Contrast material is noted throughout the colon. No other focal abnormality is noted. IMPRESSION: Feeding catheter within the stomach. Electronically Signed   By: Inez Catalina M.D.   On: 09/22/2016 17:43   Dg Abd Portable 1v  Result Date: 09/14/2016 CLINICAL DATA:  NG tube placement. EXAM: PORTABLE ABDOMEN - 1 VIEW COMPARISON:  CT scan 09/07/2016 FINDINGS: The NG tube tip is in the body region the stomach. The bowel gas pattern is unremarkable. IMPRESSION: NG tube tip is in the body region of the stomach. Electronically Signed   By: Marijo Sanes M.D.   On: 09/14/2016 09:18   Dg Swallowing Func-speech Pathology  Result Date: 09/22/2016 Objective Swallowing Evaluation: Type of Study: MBS-Modified Barium Swallow Study Patient Details Name: TATIANA COURTER MRN: [id number] Date of Birth: October 19, 1940 Today's Date: 09/22/2016 Time: SLP Start Time (ACUTE ONLY): 1400-SLP Stop Time (ACUTE ONLY): 1420 SLP Time Calculation (min) (ACUTE ONLY): 20 min Past Medical History: Past Medical History: Diagnosis Date . Abdominal  or pelvic swelling, mass, or lump, left upper quadrant  . CHF (congestive heart failure) ([place])  . Chronic atrial fibrillation ([place])  . Chronic kidney disease  . Congestive heart failure, unspecified  . Coronary atherosclerosis of unspecified type of vessel, native or graft  . Diabetes mellitus  . Edema  . GERD (gastroesophageal reflux disease)  . Gout, unspecified  . Hypercholesterolemia  . Hypertension  . 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 . Peripheral vascular disease, unspecified ([place])  . Seizures ([place])  . Sinus of Valsalva aneurysm   a. By 2D echo 05/2011. . Stroke ([place])  . Unspecified hereditary and idiopathic peripheral  neuropathy  . Unspecified urinary incontinence  . Vascular dementia, uncomplicated  Past Surgical History: Past Surgical History: Procedure Laterality Date . AORTOGRAM  09/01/2008  Abd w/ bilateral lower extremity runoff arteriography . APPLICATION OF WOUND VAC Left 6/0/4540  Procedure: APPLICATION OF WOUND VAC LEFT GROIN;  Surgeon: Serafina Mitchell, MD;  Location: [place];  Service: Vascular;  Laterality: Left; . ENDARTERECTOMY FEMORAL Left 08/30/2016  Procedure: LEFT COMMON FEMORAL ARTERY ENDARTERECTOMY;  Surgeon: Serafina Mitchell, MD;  Location: [place];  Service: Vascular;  Laterality: Left; . FALSE ANEURYSM REPAIR  12/06/2011  Procedure: REPAIR FALSE ANEURYSM;  Surgeon: Angelia Mould, MD;  Location: [facility] OR;  Service: Vascular;  Laterality: Left;  Repair of left femoral Artery pseudoaneurysm . FALSE ANEURYSM REPAIR Left 08/30/2016  Procedure: REPAIR PSEUDOANEURYSM LEFT GROIN;  Surgeon: Serafina Mitchell, MD;  Location: [place];  Service: Vascular;  Laterality: Left; . FEMORAL ARTERY EXPLORATION  12/06/2011  Procedure: FEMORAL ARTERY EXPLORATION;  Surgeon: Angelia Mould, MD;  Location: [facility] OR;  Service: Vascular;  Laterality: N/A;  Exploration of large pseudoaneurysm left side of fem-fem bypass graft  . femoral to femoral bypass graft    . FEMORAL-FEMORAL BYPASS GRAFT  12/06/2011  Procedure: BYPASS GRAFT FEMORAL-FEMORAL ARTERY;  Surgeon: Angelia Mould, MD;  Location: [facility] OR;  Service: Vascular;  Laterality: Bilateral;  Revision of left to right Femoral-Femoral bypass graft . FEMORAL-FEMORAL BYPASS GRAFT Left 08/30/2016  Procedure: REDO RIGHT TO LEFT FEMORAL-FEMORAL ARTERY BYPASS GRAFT;  Surgeon: Serafina Mitchell, MD;  Location: [place];  Service: Vascular;  Laterality: Left; . FEMORAL-POPLITEAL BYPASS GRAFT Left 05/2002  lower extremity femoral to below knee w/ non-versed greater saphenous vein . FEMORAL-POPLITEAL BYPASS GRAFT Left 11/2002 . FEMORAL-POPLITEAL BYPASS GRAFT Left 08/30/2016  Procedure: REVISION OF  LEFT FEMORAL-POPLITEAL ARTERY BYPASS GRAFT;  Surgeon: Serafina Mitchell, MD;  Location: [place];  Service: Vascular;  Laterality: Left; . THROMBECTOMY FEMORAL ARTERY Left 08/30/2016  Procedure: THROMBECTOMY OF FEMORAL-FEMORAL ARTERY BYPASS GRAFT AND LEFT FEMORAL TO LEFT POPLITEAL BYPASS GRAFT;  Surgeon: Serafina Mitchell, MD;  Location: [place];  Service: Vascular;  Laterality: Left; . TUBAL LIGATION Bilateral  HPI: Marva A Kittrellis a 76 y.o.femalewith a past medical history significant for lymphoma, dementia, pAF not on anticoag, CVA with R hemiparesis, CHF EF 35%, HTN, PVD s/p fem-fem bypass, seizures and legally blindwho presents with AMS, Pt found to have pna and required intubation from 8/17-8/22.  Swallow evaluation ordered.  Subjective: upright in [place] chair Assessment / Plan / Recommendation CHL IP CLINICAL IMPRESSIONS 09/22/2016 Clinical Impression Patient presents with mild oral, moderate pharyngeal and suspected primary esophageal dysphagia. Deviations in swallow physiology include impaired pharyngeal sensation which is likely multifactorial given pt's intubation,  hx of GERD. This resulted in delayed swallow initiation with liquids, which at times did not occur until liquids were already in pt's cervical esophagus (UES open at rest, NG present). This resulted in 1 episode of trace, sensed aspiration of thin liquids which ocurred during the swallow. Oral stage was largely functional with the exception of pt's difficulty transiting barium tablet; she was unable to swallow whole or when broken into smaller pieces, though this is likely secondary to esophageal dysphagia, as pt was repeatedly belching and gagging slightly during attempts. Pharyngeal stage characterized by adequate tongue base retraction and hyolaryngeal excursion, reduced pharyngeal stripping wave suspect secondary to suboptimal esophageal pressures. Cervical esophageal phase notable for intermittent backflow into the cervical esophagus and  pharynx with thin and nectar thick liquids, regular solids. Frequent wet belching with thin liquids as well as delayed coughing which did not appear to be associated with penetration/aspiration events, however penetration/aspiration of esophageal backflow between frames not excluded. An esophageal sweep with nectar thick liquids revealed a standing column of barium in the upper esophagus, with retrograde bolus flow through the UES into the pharynx. Recommend pt remain NPO with NG at this time; she may benefit from GI referral, esophageal workup. SLP will f/u for readiness for PO/repeat instrumental pending follow-up for her suspected primary esophageal dysphagia.  SLP Visit Diagnosis Dysphagia, oropharyngeal phase (R13.12);Dysphagia, pharyngoesophageal phase (R13.14) Attention and concentration deficit following -- Frontal lobe and executive function deficit following -- Impact on safety and function Severe aspiration risk   CHL IP TREATMENT RECOMMENDATION 09/22/2016 Treatment Recommendations F/U MBS in --- days (Comment);Therapy as outlined in treatment plan below   Prognosis 09/22/2016 Prognosis for Safe Diet Advancement Guarded Barriers to Reach Goals Severity of deficits;Cognitive deficits Barriers/Prognosis Comment -- CHL IP DIET RECOMMENDATION 09/22/2016 SLP Diet Recommendations NPO;Alternative means - temporary Liquid Administration via -- Medication Administration Via alternative means Compensations -- Postural Changes --   CHL IP OTHER RECOMMENDATIONS 09/22/2016 Recommended Consults Consider GI evaluation;Consider esophageal assessment Oral Care Recommendations Oral care QID Other Recommendations --   CHL IP FOLLOW UP RECOMMENDATIONS 09/22/2016 Follow up Recommendations Other (comment)   CHL IP FREQUENCY AND DURATION 09/22/2016 Speech Therapy Frequency (ACUTE ONLY) min 2x/week Treatment Duration 2 weeks      CHL IP ORAL PHASE 09/22/2016 Oral Phase Impaired Oral - Pudding Teaspoon -- Oral - Pudding Cup -- Oral -  Honey Teaspoon -- Oral - Honey Cup -- Oral - Nectar Teaspoon -- Oral - Nectar Cup -- Oral - Nectar Straw -- Oral - Thin Teaspoon -- Oral - Thin Cup -- Oral - Thin Straw -- Oral - Puree -- Oral - Mech Soft -- Oral - Regular -- Oral - Multi-Consistency -- Oral - Pill Lingual pumping;Reduced posterior propulsion;Holding of bolus;Lingual/palatal residue Oral Phase - Comment --  CHL IP PHARYNGEAL PHASE 09/22/2016 Pharyngeal Phase Impaired Pharyngeal- Pudding Teaspoon -- Pharyngeal -- Pharyngeal- Pudding Cup -- Pharyngeal -- Pharyngeal- Honey Teaspoon -- Pharyngeal -- Pharyngeal- Honey Cup -- Pharyngeal -- Pharyngeal- Nectar Teaspoon -- Pharyngeal -- Pharyngeal- Nectar Cup Delayed swallow initiation-pyriform sinuses Pharyngeal -- Pharyngeal- Nectar Straw -- Pharyngeal -- Pharyngeal- Thin Teaspoon -- Pharyngeal -- Pharyngeal- Thin Cup Delayed swallow initiation-pyriform sinuses;Reduced pharyngeal peristalsis Pharyngeal -- Pharyngeal- Thin Straw Reduced pharyngeal peristalsis Pharyngeal -- Pharyngeal- Puree Delayed swallow initiation-vallecula;Reduced pharyngeal peristalsis Pharyngeal -- Pharyngeal- Mechanical Soft -- Pharyngeal -- Pharyngeal- Regular Delayed swallow initiation-vallecula;Reduced pharyngeal peristalsis Pharyngeal -- Pharyngeal- Multi-consistency -- Pharyngeal -- Pharyngeal- Pill -- Pharyngeal -- Pharyngeal Comment --  CHL IP CERVICAL ESOPHAGEAL PHASE 09/22/2016  Cervical Esophageal Phase Impaired Pudding Teaspoon -- Pudding Cup -- Honey Teaspoon -- Honey Cup -- Nectar Teaspoon -- Nectar Cup Esophageal backflow into cervical esophagus;Esophageal backflow into the pharynx Nectar Straw -- Thin Teaspoon -- Thin Cup Esophageal backflow into cervical esophagus Thin Straw Esophageal backflow into cervical esophagus Puree Esophageal backflow into cervical esophagus Mechanical Soft -- Regular Esophageal backflow into the pharynx;Esophageal backflow into cervical esophagus Multi-consistency -- Pill -- Cervical Esophageal  Comment -- Deneise Lever, MS, CCC-SLP Speech-Language Pathologist [phone] No flowsheet data found. Aliene Altes 09/22/2016, 3:44 PM               Time Spent in minutes 35   Louellen Molder M.D on 10/04/2016 at 12:48 PM  Between 7am to 7pm - Pager - [phone]  After 7pm go to [email or web address] - password [facility]  [facility] -  Office  [phone]

## 2016-10-04 NOTE — Progress Notes (Signed)
Will plan MBS for next date approximately 0830 per Xray. Thanks. Luanna Salk, [facility] SLP [phone]

## 2016-10-04 NOTE — Progress Notes (Signed)
Date: October 04, 2016 MD attending notified that patient is a good ltach candidate and that [facility] have available beds. Vernia Buff, [phone]

## 2016-10-04 NOTE — Progress Notes (Signed)
CSW following to assist with dc planning. Pt is from [facility] [employer] PN reviewed. Family requesting feeding tube placement. . Pt lacks capacity for decision making. Pt is not stable for dc at this time. CSW will continue to follow to asist with dc planning needs.  Werner Lean LCSW [phone]

## 2016-10-04 NOTE — Progress Notes (Addendum)
  Speech Language Pathology Treatment: Dysphagia  Patient Details Name: Casey [name] MRN: [id number] DOB: 1940/11/04 Today's Date: 10/04/2016 Time: 4098-1191 SLP Time Calculation (min) (ACUTE ONLY): 19 min  Assessment / Plan / Recommendation Clinical Impression  SLP received order to determine if po diet possible for pt prior to initiation of PEG placement.  Pt is alert today and able to follow some directions.  Voice is stronger than since extubated but still weak.    Provided pt with intake of tsp thin, nectar tsp and applesauce.  Pt belches after EACH bolus and demonstrates symptoms of oropharyngeal dysphagia c/b throat clearing, cough and multiple swallows.  Would recommend pt have MBS given her full code status and h/o dysphagia prior to initiation of po diet. Also SLP questions source of her belching- could GI help with this issue?      SLP phoned SLP at SNF and she reports pt's belching with intake is baseline.  She states pt was not overtly demonstrating + s/s of aspiration with intake but oral deficits present and pt with poor intake.  Pt was only at SNF for one day prior to readmission to hospital.    Pt may do marginally better on MBS given she no longer has small bore feeding in place and her voice is stronger.     HPI HPI: Yamili Lichtenwalner Kittrellis a 76 y.o.femalewith a past medical history significant for lymphoma, dementia, pAF not on anticoag, CVA with R hemiparesis, CHF EF 35%, HTN, PVD s/p fem-fem bypass, seizures and legally blindwho presents with AMS, Pt found to have pna and required intubation from 8/17-8/22.  Swallow evaluation ordered.       SLP Plan  Continue with current plan of care       Recommendations  Diet recommendations: NPO;Other(comment) (oral care, ? ice chips) Medication Administration: Via alternative means                Oral Care Recommendations: Oral care QID Follow up Recommendations:  (tbd) SLP Visit Diagnosis: Dysphagia, oropharyngeal  phase (R13.12);Dysphagia, pharyngoesophageal phase (R13.14) Plan: Continue with current plan of care       [place], [place], [place] [facility] SLP [phone]

## 2016-10-05 ENCOUNTER — Other Ambulatory Visit (HOSPITAL_COMMUNITY): Payer: Medicare Other

## 2016-10-05 ENCOUNTER — Inpatient Hospital Stay (HOSPITAL_COMMUNITY): Payer: Medicare Other

## 2016-10-05 ENCOUNTER — Inpatient Hospital Stay
Admission: AD | Admit: 2016-10-05 | Discharge: 2016-11-01 | Disposition: A | Discharge status: Skilled Nursing Facility | Payer: Medicare Other | Source: Other Acute Inpatient Hospital | Attending: Internal Medicine | Admitting: Internal Medicine

## 2016-10-05 DIAGNOSIS — R142 Eructation: Secondary | ICD-10-CM

## 2016-10-05 DIAGNOSIS — B3749 Other urogenital candidiasis: Secondary | ICD-10-CM | POA: Diagnosis not present

## 2016-10-05 DIAGNOSIS — N183 Chronic kidney disease, stage 3 unspecified: Secondary | ICD-10-CM | POA: Diagnosis present

## 2016-10-05 DIAGNOSIS — R1319 Other dysphagia: Secondary | ICD-10-CM

## 2016-10-05 DIAGNOSIS — F0150 Vascular dementia without behavioral disturbance: Secondary | ICD-10-CM

## 2016-10-05 DIAGNOSIS — Z515 Encounter for palliative care: Secondary | ICD-10-CM

## 2016-10-05 DIAGNOSIS — R627 Adult failure to thrive: Secondary | ICD-10-CM | POA: Diagnosis present

## 2016-10-05 DIAGNOSIS — J690 Pneumonitis due to inhalation of food and vomit: Secondary | ICD-10-CM

## 2016-10-05 DIAGNOSIS — I6930 Unspecified sequelae of cerebral infarction: Secondary | ICD-10-CM

## 2016-10-05 DIAGNOSIS — E43 Unspecified severe protein-calorie malnutrition: Secondary | ICD-10-CM

## 2016-10-05 DIAGNOSIS — E870 Hyperosmolality and hypernatremia: Secondary | ICD-10-CM | POA: Diagnosis not present

## 2016-10-05 DIAGNOSIS — E876 Hypokalemia: Secondary | ICD-10-CM | POA: Diagnosis not present

## 2016-10-05 DIAGNOSIS — D500 Iron deficiency anemia secondary to blood loss (chronic): Secondary | ICD-10-CM

## 2016-10-05 DIAGNOSIS — Z431 Encounter for attention to gastrostomy: Secondary | ICD-10-CM

## 2016-10-05 DIAGNOSIS — J9690 Respiratory failure, unspecified, unspecified whether with hypoxia or hypercapnia: Secondary | ICD-10-CM

## 2016-10-05 DIAGNOSIS — J189 Pneumonia, unspecified organism: Secondary | ICD-10-CM

## 2016-10-05 DIAGNOSIS — R1310 Dysphagia, unspecified: Secondary | ICD-10-CM

## 2016-10-05 DIAGNOSIS — D696 Thrombocytopenia, unspecified: Secondary | ICD-10-CM | POA: Diagnosis present

## 2016-10-05 DIAGNOSIS — Z4659 Encounter for fitting and adjustment of other gastrointestinal appliance and device: Secondary | ICD-10-CM

## 2016-10-05 DIAGNOSIS — G40909 Epilepsy, unspecified, not intractable, without status epilepticus: Secondary | ICD-10-CM

## 2016-10-05 DIAGNOSIS — N179 Acute kidney failure, unspecified: Secondary | ICD-10-CM | POA: Diagnosis present

## 2016-10-05 DIAGNOSIS — R0602 Shortness of breath: Secondary | ICD-10-CM

## 2016-10-05 LAB — PROTIME-INR
INR: 1.2
Prothrombin Time: 15.1 seconds (ref 11.4–15.2)

## 2016-10-05 LAB — GLUCOSE, CAPILLARY
GLUCOSE-CAPILLARY: 111 mg/dL — AB (ref 65–99)
GLUCOSE-CAPILLARY: 123 mg/dL — AB (ref 65–99)
Glucose-Capillary: 75 mg/dL (ref 65–99)
Glucose-Capillary: 129 mg/dL — ABNORMAL HIGH (ref 65–99)

## 2016-10-05 LAB — BASIC METABOLIC PANEL
Anion gap: 5 (ref 5–15)
BUN: 51 mg/dL — AB (ref 6–20)
CHLORIDE: 119 mmol/L — AB (ref 101–111)
CO2: 20 mmol/L — ABNORMAL LOW (ref 22–32)
CREATININE: 1.79 mg/dL — AB (ref 0.44–1.00)
Calcium: 8.9 mg/dL (ref 8.9–10.3)
GFR calc Af Amer: 31 mL/min — ABNORMAL LOW (ref >60)
GFR calc non Af Amer: 26 mL/min — ABNORMAL LOW (ref >60)
GLUCOSE: 100 mg/dL — AB (ref 65–99)
POTASSIUM: 4.9 mmol/L (ref 3.5–5.1)
Sodium: 144 mmol/L (ref 135–145)

## 2016-10-05 LAB — CBC
HCT: 23.6 % — ABNORMAL LOW (ref 36.0–46.0)
Hemoglobin: 7.9 g/dL — ABNORMAL LOW (ref 12.0–15.0)
MCH: 28.5 pg (ref 26.0–34.0)
MCHC: 33.5 g/dL (ref 30.0–36.0)
MCV: 85.2 fL (ref 78.0–100.0)
PLATELETS: 80 10*3/uL — AB (ref 150–400)
RBC: 2.77 MIL/uL — AB (ref 3.87–5.11)
RDW: 18.4 % — ABNORMAL HIGH (ref 11.5–15.5)
WBC: 10.9 10*3/uL — ABNORMAL HIGH (ref 4.0–10.5)

## 2016-10-05 MED ORDER — IPRATROPIUM-ALBUTEROL 0.5-2.5 (3) MG/3ML IN SOLN: 3.0000 mL | RESPIRATORY_TRACT | 0 refills | Status: DC | PRN

## 2016-10-05 MED ORDER — FLUCONAZOLE 10 MG/ML PO SUSR: 100.0000 mg | Freq: Every day | ORAL | 0 refills | Status: AC

## 2016-10-05 NOTE — Care Management Note (Signed)
Case Management Note  Patient Details  Name: Casey [name] MRN: [id number] Date of Birth: 03/08/1940  Subjective/Objective:                  Anemia with gi bleed  Action/Plan: Date:  October 05, 2016 Chart reviewed for concurrent status and case management needs. Will continue to follow patient progress. Discharge Planning: following for needs Expected discharge date: [id number] Velva Harman, BSN, [place], [place]  Expected Discharge Date:   (UNKNOWN)               Expected Discharge Plan:  [place]  In-House Referral:  Clinical Social Work  Discharge planning Services  CM Consult  Post Acute Care Choice:    Choice offered to:     DME Arranged:    DME Agency:     HH Arranged:    [place] Agency:     Status of Service:  In process, will continue to follow  If discussed at Long Length of Stay Meetings, dates discussed:    Additional Comments:  [name], RN 10/05/2016, 8:50 AM

## 2016-10-05 NOTE — Discharge Summary (Addendum)
Physician Discharge Summary  RONEISHA STERN HKV:[id number] DOB: 05-03-1940 DOA: 09/14/2016  PCP: Gildardo Cranker, DO  Admit date: 09/14/2016 Discharge date: 10/05/2016  Admitted From: [facility]  Recommendations for Outpatient Follow-up:  1. Patient discharged to [facility]. She is being started on full liquid diet. Please have nutritionist evaluated the patient and add supplement. If nutritional requirement insufficient please assess need for feeding tube placement. 2. Please monitor H&H and renal function in the next 1-2 days. 3. Patient may need GI evaluation for her esophageal dysphagia and belching. 4. Patient will complete 7 day course of fluconazole on 9/12.   Equipment/Devices: Oxygen (2 L via nasal cannula), Foley catheter  Discharge Condition: Guarded CODE STATUS: Full code Diet recommendation: Full liquid (add nutritional supplement)    Discharge Diagnoses:  Principal Problem:   Acute respiratory failure with hypoxia ([place])   Active Problems:   Atrial fibrillation with rapid ventricular response (HCC)   Acute on chronic systolic CHF (congestive heart failure), NYHA class 4 (HCC)   Candida UTI   Anemia due to gastrointestinal blood loss   Dementia without behavioral disturbance   Vascular dementia without behavioral disturbance   History of CVA with residual deficit   Pneumonia due to gram-positive bacteria   DCM (dilated cardiomyopathy) ([place])   HCAP (healthcare-associated pneumonia)   Encounter for palliative care   Goals of care, counseling/discussion   Acute renal failure superimposed on stage 3 chronic kidney disease (HCC)   Thrombocytopenia (HCC)   Esophageal dysphagia   Seizure disorder (HCC)   Hypokalemia   Hypomagnesemia   Hypernatremia   Severe protein-calorie malnutrition (HCC)   Failure to thrive in adult   Brief narrative/history of present illness Please refer to admission H&P for details, in brief,76 year old  female with history of CVA, seizure disorder, dementia, chronic kidney disease stage III, hypertension, chronic systolic CHF, malnutrition, anemia of chronic disease, diabetes mellitus type 2 with nephropathy presented to the ED with fever and myalgias. She was found to have healthcare associated pneumonia. She subsequently developed respiratory failure requiring intubation. Hospital course prolonged with development of A. fib requiring Cardizem drip. She also developed acute systolic CHF requiring diuresis. Patient required NG tube for feeding which she reported out and has been now planned for a PEG tube placement. Hospital course further complicated with lower GI bleed with blood loss anemia requiring transfusion. Also had persistent renal insufficiency and coagulopathy.   Hospital course   Principal problem Atrial fibrillation with RVR CHA2DS2-VASc Score8, not on anticoagulation due to acute blood loss anemia. -Cardizem drip discontinued. Heart rate has remained stable past 2 days. -Resume home dose metoprolol.  Acute on chronic systolic CHF EF of 56-43% with diffuse hypokinesis. Lasix held due to worsening renal failure. Currently euvolemic. If renal function stable has signs of fluid overload please start her on Lasix.  Acute hypoxic respiratory failure secondary to healthcare associated pneumonia Required ventilator support from 8/17-8/22. Patient completed antibiotic course. Tracheal aspirate negative for growth. Maintaining sats on 2 L via nasal cannula. Wean oxygen as tolerated.  Acute blood loss anemia Hemoglobin dropped to 4.2 on 9/2 and has received a total of 5 units PRBC since then. Continue PPI. GI was consulted who recommended given her acute comorbidities she is unlikely to tolerate GI procedure or anesthesia. Recommended palliative care.   Yeast  UTI Possibly related to indwelling foley. Treating with seven-day course of fluconazole.  Acute on chronic kidney  disease stage III Possibly due to dehydration and Lasix  which is held. Renal function slowly improving.  Resume Lasix if renal function is stable or has clinical signs or symptoms of volume overload.  Coagulopathy INR improved after receiving vitamin k . Off anticoagulation.  NSVT Replace potassium and magnesium aggressively. Now stable on the monitor.  Hypokalemia/hypomagnesemia Replaced  Severe protein calorie malnutrition/failure to thrive/Dysphagia  IR consulted for feeding tube placement. This was held due to acute GI bleed, coagulopathy, rapid A. fib and UTI. Patient remains high aspiration risk even after feeding tube is placed. This was explained in detail to her daughter. Seen by SLP and underwent MBS showing signs of esophageal dysphagia. Patient was also repeatedly belching during MBS with retrograde propulsion to thoracic esophagus. Recommend full liquid diet. Patient may benefit from GI evaluation at the [facility]. Please have dietitian see patient at the facility and add supplement. -if patient not tolerating full liquid diet along with supplements and her nutritional requirement is not adequate please reevaluate for a feeding tube placement. (Family has been insisting on having a feeding tube placed if necessary).   Hypernatremia Secondary to dehydration. Improving with D5.   Diabetes mellitus type 2 with nephropathy Has been stable on sliding scale coverage.   seizure disorder Continue Keppra  History of CVA with ?Vascular dementia Does not have capacity to make medical decision. Continue statin. Held aspirin  Thrombocytopenia Possibly due to acute illness. Aspirin discontinued. Off anticoagulation. Monitor at the facility.  Goals of care Palliative care involved and had goals of care discussion on 9/3. Patient is unable to participate in discussion and GOC was discussed with patient's son and daughter on the phone. They wished on continuing aggressive  care including placement of feeding tube. Discussed again at length with patient's daughter at bedside by palliative care and myself on 9/5. She is still interested in patient getting a feeding tube and maintaining full scope of treatment.      Family Communication  : Discussed with daughter at bedside on 9/5.  Disposition Plan  : LTAC    Consults  :   IR GI PC CM Palliative care   Procedures  :  2-D echo MBS Intubation Left PICC line    Discharge Instructions   Allergies as of 10/05/2016      Reactions   Penicillins Other (See Comments)   Told by a doctor to "not take" and on MAR as an allergy Has patient had a PCN reaction causing immediate rash, facial/tongue/throat swelling, SOB or lightheadedness with hypotension: Unknown Has patient had a PCN reaction causing severe rash involving mucus membranes or skin necrosis: Unknown Has patient had a PCN reaction that required hospitalization: Unknown Has patient had a PCN reaction occurring within the last 10 years: Unknown If all of the above answers are "NO", then may proceed with Ceph      Medication List    STOP taking these medications   aspirin 81 MG tablet   BACIGUENT 500 UNIT/GM ointment Generic drug:  bacitracin   doxycycline 100 MG tablet Commonly known as:  VIBRA-TABS   guaiFENesin-dextromethorphan 100-10 MG/5ML syrup Commonly known as:  ROBITUSSIN DM   sertraline 50 MG tablet Commonly known as:  ZOLOFT     TAKE these medications   acetaminophen 325 MG tablet Commonly known as:  TYLENOL Take 2 tablets (650 mg total) by mouth every 6 (six) hours as needed for mild pain (or Fever >/= 101).   allopurinol 100 MG tablet Commonly known as:  ZYLOPRIM Take 100 mg by mouth daily.  atorvastatin 10 MG tablet Commonly known as:  LIPITOR Take 10 mg by mouth at bedtime.   BISCOLAX 10 MG suppository Generic drug:  bisacodyl Place 10 mg rectally as needed for moderate constipation.    CETYLPYRIDINIUM CHLORIDE MT Give 15 ml by mouth two times a day for oral irritation   donepezil 10 MG tablet Commonly known as:  ARICEPT Take 1 tablet (10 mg total) by mouth at bedtime.   fluconazole 10 MG/ML suspension Commonly known as:  DIFLUCAN Take 10 mLs (100 mg total) by mouth daily.   ipratropium-albuterol 0.5-2.5 (3) MG/3ML Soln Commonly known as:  DUONEB Take 3 mLs by nebulization every 4 (four) hours as needed.   levETIRAcetam 250 MG tablet Commonly known as:  KEPPRA Take 250 mg by mouth 2 (two) times daily.   metoprolol tartrate 25 MG tablet Commonly known as:  LOPRESSOR Give 2 tablets (50mg ) by mouth in the morning and 1 tablet by mouth at bedtime   mirtazapine 7.5 MG tablet Commonly known as:  REMERON Take 7.5 mg by mouth at bedtime.   oxyCODONE 5 MG immediate release tablet Commonly known as:  Oxy IR/ROXICODONE Take 1 tablet (5 mg total) by mouth every 6 (six) hours as needed for severe pain.   pantoprazole 40 MG tablet Commonly known as:  PROTONIX Take 40 mg by mouth daily.            Discharge Care Instructions        Start     Ordered   10/05/16 0000  ipratropium-albuterol (DUONEB) 0.5-2.5 (3) MG/3ML SOLN  Every 4 hours PRN     10/05/16 1144   10/05/16 0000  fluconazole (DIFLUCAN) 10 MG/ML suspension  Daily     10/05/16 1144      Contact information for follow-up providers    [facility] Follow up in 1 day(s).   Contact information: [place] [phone]           Contact information for after-discharge care    [facility] .   Specialty:  [place] information: [address] [place] [address] [phone]                 Allergies  Allergen Reactions  . Penicillins Other (See Comments)    Told by a doctor to "not take" and on MAR as an allergy Has patient had a PCN  reaction causing immediate rash, facial/tongue/throat swelling, SOB or lightheadedness with hypotension: Unknown Has patient had a PCN reaction causing severe rash involving mucus membranes or skin necrosis: Unknown Has patient had a PCN reaction that required hospitalization: Unknown Has patient had a PCN reaction occurring within the last 10 years: Unknown If all of the above answers are "NO", then may proceed with Ceph      Procedures/Studies: Dg Chest 2 View  Result Date: 09/14/2016 CLINICAL DATA:  Generalized body aches, atrial fibrillation. Sepsis. History of lymphoma. EXAM: CHEST  2 VIEW COMPARISON:  Chest radiograph September 07, 2016 FINDINGS: New patchy airspace opacities RIGHT lung and LEFT lung base. No pleural effusion. Stable cardiomegaly. Fullness of the RIGHT greater than LEFT hila is unchanged. Calcified aortic knob. Similar chronic interstitial changes. No pneumothorax. Soft tissue planes and included osseous structures are nonsuspicious. IMPRESSION: New bilateral patchy airspace opacities concerning for pneumonia. Followup PA and lateral chest X-ray is recommended in 3-4 weeks following trial of antibiotic therapy  to ensure resolution and exclude underlying malignancy. Stable cardiomegaly and chronic interstitial changes. Stable fullness of the hila concerning for lymphadenopathy given patient's history of lymphoma. Electronically Signed   By: Elon Alas M.D.   On: 09/14/2016 04:49   Dg Chest 2 View  Result Date: 09/07/2016 CLINICAL DATA:  Tachycardia today. EXAM: CHEST  2 VIEW COMPARISON:  CT chest 01/10/2015.  PA and lateral chest 12/06/2011. FINDINGS: There is cardiomegaly without edema. Fullness of the right hilum consistent with lymphadenopathy as seen on the prior CT scan is unchanged. The lungs are clear. There is no pneumothorax. Small bilateral pleural effusions are seen. Aortic atherosclerosis is noted. IMPRESSION: Small bilateral pleural effusions. Cardiomegaly  without edema. Atherosclerosis. Fullness of the right hilum consistent with lymphadenopathy as seen on prior CT scan is unchanged. Electronically Signed   By: Inge Rise M.D.   On: 09/07/2016 12:17   Dg Abd 1 View  Result Date: 09/21/2016 CLINICAL DATA:  Nasogastric tube placement. EXAM: ABDOMEN - 1 VIEW COMPARISON:  Yesterday. FINDINGS: Feeding tube tip with its tip in scratch sec peak 2 tip in the mid stomach. No a nasogastric tube is seen. Normal bowel gas pattern. Left iliac stent. Lumbar spine degenerative changes. Small to moderate-sized right pleural effusion, increased. Interval small left pleural effusion. IMPRESSION: 1. Feeding tube tip in the mid stomach. 2. No nasogastric tube seen. 3. Small moderate-sized right pleural effusion and small left pleural effusion. Electronically Signed   By: Claudie Revering M.D.   On: 09/21/2016 20:49   Dg Abd 1 View  Result Date: 09/20/2016 CLINICAL DATA:  Evaluate NG tube EXAM: ABDOMEN - 1 VIEW COMPARISON:  Abdomen radiograph 09/19/2016 FINDINGS: Enteric tube tip projects over the left upper quadrant, feeding tube projects over the stomach. Mildly gaseous distended stomach. Nonobstructed bowel gas pattern. Lumbar spine degenerative changes. Cardiomegaly. Basilar atelectasis. Stent projects over the left hemipelvis. Bilateral lower abdominal surgical clips. IMPRESSION: Feeding tube within the mid stomach. Electronically Signed   By: Lovey Newcomer M.D.   On: 09/20/2016 15:38   Dg Abd 1 View  Result Date: 09/19/2016 CLINICAL DATA:  Pain and a placement EXAM: ABDOMEN - 1 VIEW COMPARISON:  09/14/2016 FINDINGS: Soft feeding tube enters the abdomen with its tip in the midportion of the stomach. Bowel gas pattern appears normal. IMPRESSION: Feeding tube tip in the midportion of the stomach. Electronically Signed   By: Nelson Chimes M.D.   On: 09/19/2016 11:17   Dg Abd 1 View  Result Date: 09/14/2016 CLINICAL DATA:  OG tube placement EXAM: ABDOMEN - 1 VIEW  COMPARISON:  09/14/2016 FINDINGS: OG tube tip is in the distal stomach. Nonobstructive bowel gas pattern. IMPRESSION: OG tube tip in the distal stomach. Electronically Signed   By: Rolm Baptise M.D.   On: 09/14/2016 11:04   Ct Angio Ao+bifem W & Or Wo Contrast  Result Date: 09/07/2016 CLINICAL DATA:  Status post repair of left femoral pseudoaneurysm with revision of right the left femoral- femoral bypass graft and left femoral-popliteal bypass graft. There has been some oozing of blood from the left groin operative site. EXAM: CT ANGIOGRAPHY OF ABDOMINAL AORTA WITH ILIOFEMORAL RUNOFF TECHNIQUE: Multidetector CT imaging of the abdomen, pelvis and lower extremities was performed using the standard protocol during bolus administration of intravenous contrast. Multiplanar CT image reconstructions and MIPs were obtained to evaluate the vascular anatomy. CONTRAST:  100 mL Isovue 370 IV COMPARISON:  CT of the abdomen and pelvis on 08/29/2016 FINDINGS: VASCULAR Aorta: The abdominal aorta  and shows stable patency and atherosclerotic plaque. No evidence of aneurysmal disease or significant aortic stenosis. Celiac: Calcified plaque causes approximately 40- 50% origin stenosis. SMA: Calcified plaque causes approximately 50% origin stenosis. Renals: Bilateral single renal arteries without significant stenosis. IMA: Origin patent. The proximal trunk shows heavily calcified plaque without occlusion. RIGHT Lower Extremity Inflow: Calcified common iliac artery without significant stenosis. Internal and external iliac arteries show diffuse disease. Maximal narrowing of the proximal external iliac artery approaches 50%. Outflow: Calcified plaque causes focal narrowing of the common femoral artery just prior to the femoral-femoral bypass graft. The artery is narrowed approximately 60- 70%. The femoral-femoral graft is patent but is poorly opacified distally in the left groin. There is some thickening around the distal graft  extending superiorly and abutting the abdominal wall. There likely is some postoperative hemorrhage in this region as well as a few foci of air. No findings to suggest focal abscess. 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 Profunda femoral artery is patent. At the level of the popliteal artery, there is poor arterial opacification and popliteal patency cannot be adequately assessed. Runoff: Patency of runoff below the right knee cannot be adequately assessed, due to poor opacification of distal vessels. 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 Internal and external iliac arteries are also chronically occluded. At the level of the left groin, the distal aspect of the femoral- femoral bypass graft is poorly opacified and there is evidence now of a new synthetic femoral-popliteal bypass graft which is poorly opacified. Outflow: Unable to assess distal bypass graft and native popliteal artery patency. Runoff: Unable to assess runoff artery patency. Review of the MIP images confirms the above findings. NON-VASCULAR Lower chest: Bibasilar scarring and small bilateral pleural effusions, right greater than left. Hepatobiliary: No focal liver abnormality is seen. No gallstones, gallbladder wall thickening, or biliary dilatation. Pancreas: Unremarkable. No pancreatic ductal dilatation or surrounding inflammatory changes. Spleen: Normal in size without focal abnormality. Adrenals/Urinary Tract: Adrenal glands are unremarkable. Stable nonobstructing right renal calculi. Stable lower pole renal cyst on the left. Stable small right-sided bladder calculus. Stomach/Bowel: No evidence of bowel obstruction or ileus. There is a fairly large amount of fecal material located in the rectum. No free air. Lymphatic: No enlarged lymph nodes identified. Reproductive: Uterus demonstrates calcified degenerated fibroids. No adnexal masses. Other: No  free fluid or hernias. Musculoskeletal: Degenerative disc disease at L5-S1. IMPRESSION: VASCULAR 1. Poorly opacified distal segment of the right to left femoral- femoral bypass graft and poorly opacified new left femoral to popliteal bypass graft. Although some of this is felt to relate to contrast timing, there would be some concern of the potential for imminent thrombosis and vascular surgical assessment is recommended. The previously identified left groin pseudoaneurysm has been resected and repaired and there is no evidence of recurrent pseudoaneurysm or active contrast extravasation. Some postoperative changes are evident around the graft likely representing some hemorrhage that extends superiorly abutting the abdominal wall. 2. The native right common femoral artery does demonstrate significant disease and focal moderate stenosis just proximal to the bypass graft anastomosis which could account for some potential inflow restriction into the femoral-femoral graft. 3. Poor opacification at the level of bilateral popliteal and tibial arteries on the CTA results and lack of accurate patency assessment of these vessels. NON-VASCULAR 1. Stable nonobstructing right renal calculi and bladder calculus. 2. Fairly large amount of stool is present in the rectum. Electronically Signed  By: Aletta Edouard M.D.   On: 09/07/2016 16:06   Dg Chest Port 1 View  Result Date: 09/30/2016 CLINICAL DATA:  SOb today EXAM: PORTABLE CHEST - 1 VIEW COMPARISON:  09/24/2016 FINDINGS: Lungs are clear.  Left arm PICC line to the distal SVC as before. Heart size and mediastinal contours are within normal limits. Atheromatous aorta. No effusion.  No pneumothorax. Visualized bones unremarkable. IMPRESSION: No acute cardiopulmonary disease. Aortic Atherosclerosis (ICD10-170.0) Electronically Signed   By: Lucrezia Europe M.D.   On: 09/30/2016 09:22   Dg Chest Port 1 View  Result Date: 09/24/2016 CLINICAL DATA:  Shortness breath, CHF EXAM:  PORTABLE CHEST 1 VIEW COMPARISON:  09/22/2016 FINDINGS: Left PICC line remains in place, unchanged. Cardiomegaly with vascular congestion, improving since prior study. Improving aeration in the lung bases. No overt edema, effusions or confluent opacities. IMPRESSION: Cardiomegaly, vascular congestion. Vascular congestion has improved since prior study with improving aeration in the lung bases. Electronically Signed   By: Rolm Baptise M.D.   On: 09/24/2016 07:49   Dg Chest Port 1 View  Result Date: 09/22/2016 CLINICAL DATA:  Acute respiratory failure with hypoxia Hx CHF, diabetes, HTN, former smoker. EXAM: PORTABLE CHEST - 1 VIEW COMPARISON:  the previous day's study FINDINGS: Feeding tube and left arm PICC stable in position. Perihilar and bibasilar infiltrates or edema left greater than right, slightly improved since previous exam. Stable cardiomegaly.  Atheromatous aorta. Small right pleural effusion as before. Visualized bones unremarkable. IMPRESSION: 1. Asymmetric infiltrates or edema, slightly improved since previous exam. 2. Stable cardiomegaly and small right effusion. 3.  Support hardware stable in position. Electronically Signed   By: Lucrezia Europe M.D.   On: 09/22/2016 11:19   Dg Chest Port 1 View  Result Date: 09/21/2016 CLINICAL DATA:  Pneumonia. EXAM: PORTABLE CHEST 1 VIEW COMPARISON:  09/20/2016. FINDINGS: Feeding tube, left PICC line in stable position. Cardiomegaly with mild bilateral interstitial prominence and pleural effusions consistent with congestive heart failure. Similar findings on prior exam. Basilar atelectasis. No pneumothorax. IMPRESSION: 1. Lines and tubes in stable position. 2. Cardiomegaly with diffuse mild bilateral interstitial prominence and bilateral pleural effusions consistent with congestive heart failure. Similar findings noted on prior exam. 3. Basilar atelectasis . Electronically Signed   By: Marcello Moores  Register   On: 09/21/2016 06:23   Dg Chest Port 1 View  Result  Date: 09/20/2016 CLINICAL DATA:  Dyspnea EXAM: PORTABLE CHEST 1 VIEW COMPARISON:  09/19/2016 FINDINGS: Multifocal perihilar and bilateral lower lobe opacities, suspicious for moderate interstitial edema, less likely multifocal pneumonia. Associated small bilateral pleural effusions, right greater than left. Overall appearance is likely mildly progressed. Cardiomegaly. Left arm PICC terminates at the cavoatrial junction. Prior enteric tube has been replaced with a feeding tube which courses into the stomach. IMPRESSION: Suspected moderate interstitial edema, mildly progressed. Small bilateral pleural effusions, right greater than left. Electronically Signed   By: Julian Hy M.D.   On: 09/20/2016 23:06   Dg Chest Port 1 View  Result Date: 09/19/2016 CLINICAL DATA:  Respiratory failure. EXAM: PORTABLE CHEST 1 VIEW COMPARISON:  09/18/2016. FINDINGS: Endotracheal tube, NG tube, left PICC line stable position . Cardiomegaly with slight increase in basilar infiltrates and small pleural effusions consistent with slight worsening of CHF. No pneumothorax. IMPRESSION: 1.  Lines and tubes in stable position. 2. Cardiomegaly with slight increase in basilar infiltrates and small pleural effusions consistent with slight worsening of CHF. Electronically Signed   By: Marcello Moores  Register   On: 09/19/2016 06:21  Dg Chest Port 1 View  Result Date: 09/18/2016 CLINICAL DATA:  PICC line placement. EXAM: PORTABLE CHEST 1 VIEW COMPARISON:  09/18/2016. FINDINGS: PICC line noted with its tip projected over the superior vena cava. Endotracheal tube and NG tube in stable position. Cardiomegaly with bilateral mild interstitial prominence and small pleural effusions consistent CHF. Mild basilar atelectasis.Slight improvement from prior exam. IMPRESSION: 1.  Left PICC line in stable position. 2.  Endotracheal tube and NG tube in stable position. 3.  Mild from prior exam . Electronically Signed   By: Marcello Moores  Register   On: 09/18/2016  12:54   Dg Chest Port 1 View  Result Date: 09/18/2016 CLINICAL DATA:  Respiratory failure, history of lymphoma, CHF, peripheral vascular disease, current smoker. EXAM: PORTABLE CHEST 1 VIEW COMPARISON:  Portable chest x-ray of September 17, 2016 FINDINGS: The trachea remains intubated with the tip of the tube lying 2.5 cm above the carina. The esophagogastric tube tip projects below the inferior margin of the image. The lungs are well-expanded. The lung markings are coarse in the lower lobes. A small right pleural effusion likely layers posteriorly. The cardiac silhouette remains enlarged. The pulmonary vascularity is minimally prominent. There is calcification in the wall of the thoracic aorta. IMPRESSION: COPD. Probable subsegmental atelectasis or infiltrate in the lower lobes without discrete air bronchograms. Reasonable positioning of the endotracheal tube. Mild cardiomegaly with mild pulmonary vascular congestion. Thoracic aortic atherosclerosis. Electronically Signed   By: David  Martinique M.D.   On: 09/18/2016 07:09   Dg Chest Port 1 View  Result Date: 09/17/2016 CLINICAL DATA:  Respiratory failure. EXAM: PORTABLE CHEST 1 VIEW COMPARISON:  09/16/2016. FINDINGS: Endotracheal tube tip 1.4 cm above the carina. Retraction of approximately 2 cm should be considered. NG tube noted in stable position. Cardiomegaly with diffuse mild bilateral interstitial prominence and small bilateral pleural effusions suggesting CHF again noted. Similar findings noted on prior exam . Improved basilar atelectasis. No pneumothorax. IMPRESSION: 1. Endotracheal tube tip 1.4 cm above the carina. Retraction of approximately 2 cm should be considered. NG tube noted stable position. 2. Cardiomegaly with diffuse mild bilateral interstitial prominence and small bilateral pleural effusions suggesting CHF again noted. Similar findings noted on prior exam. 3. Improved bibasilar atelectasis. Electronically Signed   By: Marcello Moores  Register   On:  09/17/2016 06:48   Dg Chest Port 1 View  Result Date: 09/16/2016 CLINICAL DATA:  Acute respiratory failure EXAM: PORTABLE CHEST 1 VIEW COMPARISON:  Two days ago FINDINGS: Endotracheal tube tip between the clavicular heads and carina. An orogastric tube has been advanced to the stomach at least. Cardiomegaly. Partial visualization of the left diaphragm. Haziness of the lower chest is likely from atelectasis and pleural effusions. Atelectasis about the right minor fissure and patchy primarily perihilar opacity. Interstitial coarsening is improved. No pneumothorax. IMPRESSION: 1. Unremarkable positioning of tubes and central line. 2. Interstitial coarsening is improved, likely decreasing edema. 3. Patchy bilateral airspace opacity from residual airspace edema or pneumonia. Progressive atelectasis at the left base with volume loss. Electronically Signed   By: Monte Fantasia M.D.   On: 09/16/2016 07:02   Dg Chest Portable 1 View  Result Date: 09/14/2016 CLINICAL DATA:  Intubated.  OG tube placement. EXAM: PORTABLE CHEST 1 VIEW COMPARISON:  09/14/2016 FINDINGS: Endotracheal tube is 4 cm above the carina. OG tube tip is in the midesophagus. Cardiomegaly. Patchy bilateral airspace opacities, right greater than left are similar to prior study. No effusions. IMPRESSION: Endotracheal tube 4 cm above the carina. OG  tube tip in the midthoracic esophagus. Recommend advancing. Stable patchy bilateral airspace opacities, right greater than left. Electronically Signed   By: Rolm Baptise M.D.   On: 09/14/2016 09:19   Dg Abd Portable 1v  Result Date: 09/22/2016 CLINICAL DATA:  Check feeding catheter placement EXAM: PORTABLE ABDOMEN - 1 VIEW COMPARISON:  None. FINDINGS: Feeding catheter is noted in the distal stomach. Contrast material is noted throughout the colon. No other focal abnormality is noted. IMPRESSION: Feeding catheter within the stomach. Electronically Signed   By: Inez Catalina M.D.   On: 09/22/2016 17:43    Dg Abd Portable 1v  Result Date: 09/14/2016 CLINICAL DATA:  NG tube placement. EXAM: PORTABLE ABDOMEN - 1 VIEW COMPARISON:  CT scan 09/07/2016 FINDINGS: The NG tube tip is in the body region the stomach. The bowel gas pattern is unremarkable. IMPRESSION: NG tube tip is in the body region of the stomach. Electronically Signed   By: Marijo Sanes M.D.   On: 09/14/2016 09:18   Dg Swallowing Func-speech Pathology  Result Date: 10/05/2016 Objective Swallowing Evaluation: Type of Study: MBS-Modified Barium Swallow Study Patient Details Name: ROSIO WEISS MRN: [id number] Date of Birth: 05/08/1940 Today's Date: 10/05/2016 Time: SLP Start Time (ACUTE ONLY): 0840-SLP Stop Time (ACUTE ONLY): 0910 SLP Time Calculation (min) (ACUTE ONLY): 30 min Past Medical History: Past Medical History: Diagnosis Date . Abdominal or pelvic swelling, mass, or lump, left upper quadrant  . CHF (congestive heart failure) ([place])  . Chronic atrial fibrillation ([place])  . Chronic kidney disease  . Congestive heart failure, unspecified  . Coronary atherosclerosis of unspecified type of vessel, native or graft  . Diabetes mellitus  . Edema  . GERD (gastroesophageal reflux disease)  . Gout, unspecified  . Hypercholesterolemia  . Hypertension  . 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 . Peripheral vascular disease, unspecified ([place])  . Seizures ([place])  . Sinus of Valsalva aneurysm   a. By 2D echo 05/2011. . Stroke ([place])  . Unspecified hereditary and idiopathic peripheral neuropathy  . Unspecified urinary incontinence  . Vascular dementia, uncomplicated  Past Surgical History: Past Surgical History: Procedure Laterality Date . AORTOGRAM  09/01/2008  Abd w/ bilateral lower extremity runoff arteriography . APPLICATION OF WOUND VAC Left 06/30/8364  Procedure: APPLICATION OF WOUND VAC LEFT GROIN;  Surgeon:  Serafina Mitchell, MD;  Location: [place];  Service: Vascular;  Laterality: Left; . ENDARTERECTOMY FEMORAL Left 08/30/2016  Procedure: LEFT COMMON FEMORAL ARTERY ENDARTERECTOMY;  Surgeon: Serafina Mitchell, MD;  Location: [place];  Service: Vascular;  Laterality: Left; . FALSE ANEURYSM REPAIR  12/06/2011  Procedure: REPAIR FALSE ANEURYSM;  Surgeon: Angelia Mould, MD;  Location: [facility] OR;  Service: Vascular;  Laterality: Left;  Repair of left femoral Artery pseudoaneurysm . FALSE ANEURYSM REPAIR Left 08/30/2016  Procedure: REPAIR PSEUDOANEURYSM LEFT GROIN;  Surgeon: Serafina Mitchell, MD;  Location: [place];  Service: Vascular;  Laterality: Left; . FEMORAL ARTERY EXPLORATION  12/06/2011  Procedure: FEMORAL ARTERY EXPLORATION;  Surgeon: Angelia Mould, MD;  Location: [facility] OR;  Service: Vascular;  Laterality: N/A;  Exploration of large pseudoaneurysm left side of fem-fem bypass graft  . femoral to femoral bypass graft    . FEMORAL-FEMORAL BYPASS GRAFT  12/06/2011  Procedure: BYPASS GRAFT FEMORAL-FEMORAL ARTERY;  Surgeon: Angelia Mould, MD;  Location: [place];  Service: Vascular;  Laterality: Bilateral;  Revision of left to right Femoral-Femoral bypass graft . FEMORAL-FEMORAL BYPASS GRAFT Left 08/30/2016  Procedure: REDO RIGHT TO LEFT FEMORAL-FEMORAL ARTERY BYPASS GRAFT;  Surgeon: Serafina Mitchell, MD;  Location: [place];  Service: Vascular;  Laterality: Left; . FEMORAL-POPLITEAL BYPASS GRAFT Left 05/2002  lower extremity femoral to below knee w/ non-versed greater saphenous vein . FEMORAL-POPLITEAL BYPASS GRAFT Left 11/2002 . FEMORAL-POPLITEAL BYPASS GRAFT Left 08/30/2016  Procedure: REVISION OF LEFT FEMORAL-POPLITEAL ARTERY BYPASS GRAFT;  Surgeon: Serafina Mitchell, MD;  Location: [place];  Service: Vascular;  Laterality: Left; . THROMBECTOMY FEMORAL ARTERY Left 08/30/2016  Procedure: THROMBECTOMY OF FEMORAL-FEMORAL ARTERY BYPASS GRAFT AND LEFT FEMORAL TO LEFT POPLITEAL BYPASS GRAFT;  Surgeon: Serafina Mitchell, MD;  Location: [place];  Service: Vascular;  Laterality: Left; . TUBAL LIGATION Bilateral  HPI: Shanea A Kittrellis a 76 y.o.femalewith a past medical history significant for lymphoma, dementia, pAF not on anticoag, CVA with R hemiparesis, CHF EF 35%, HTN, PVD s/p fem-fem bypass, seizures and legally blindwho presents with AMS, Pt found to have pna and required intubation from 8/17-8/22.  CXR 09/30/16 negative. Swallow evaluation ordered.  Subjective: upright in Hausted chair Assessment / Plan / Recommendation CHL IP CLINICAL IMPRESSIONS 10/05/2016 Clinical Impression Patient presents with mild oropharyngeal and suspected primary esophageal dysphagia. Decreased oral coordination results in premature spillage of barium into pharynx and daly in transiting of pudding/solid.  Pt pharyngeal swallow largely strong with only minimal residuals with liquids moer than solids - suspected esophageal pressure issue.  Mild pharyngeal residuals noted that cleared with reflexive swallows.  Trace upper laryngeal penetration of thin that was mixed with secretions noted.  Cued throat clear effectively cleared trace penetration.   Suspect pt's lack of cortrak feeding tube positively impacted swallow ability.  Pt was repeatedly belching during MBS with appearance of retrograde propulsion to thoracic esophagus.  Suspect this is the patient's primary aspiration risk.  Recommend consider full liquid diet with strict aspiration precautions.  Pt may benefit from GI referral, esophageal workup given pt with belching and suspected esophageal dysphagia. SLP Visit Diagnosis Dysphagia, oropharyngeal phase (R13.12);Dysphagia, pharyngoesophageal phase (R13.14) Attention and concentration deficit following -- Frontal lobe and executive function deficit following -- Impact on safety and function Moderate aspiration risk   CHL IP TREATMENT RECOMMENDATION 10/05/2016 Treatment Recommendations F/U MBS in --- days (Comment)   Prognosis 10/05/2016 Prognosis for Safe Diet  Advancement Fair Barriers to Reach Goals Cognitive deficits;Motivation Barriers/Prognosis Comment -- CHL IP DIET RECOMMENDATION 10/05/2016 SLP Diet Recommendations Thin liquid Liquid Administration via -- Medication Administration -- Compensations -- Postural Changes --   CHL IP OTHER RECOMMENDATIONS 10/05/2016 Recommended Consults Consider GI evaluation;Consider esophageal assessment Oral Care Recommendations Oral care QID Other Recommendations --   CHL IP FOLLOW UP RECOMMENDATIONS 10/05/2016 Follow up Recommendations (No Data)   CHL IP FREQUENCY AND DURATION 10/05/2016 Speech Therapy Frequency (ACUTE ONLY) min 2x/week Treatment Duration 2 weeks      CHL IP ORAL PHASE 10/05/2016 Oral Phase Impaired Oral - Pudding Teaspoon -- Oral - Pudding Cup -- Oral - Honey Teaspoon -- Oral - Honey Cup -- Oral - Nectar Teaspoon -- Oral - Nectar Cup Weak lingual manipulation;Reduced posterior propulsion;Lingual pumping Oral - Nectar Straw -- Oral - Thin Teaspoon Weak lingual manipulation;Reduced posterior propulsion;Lingual pumping Oral - Thin Cup Weak lingual manipulation;Reduced posterior propulsion;Lingual pumping Oral - Thin Straw Weak lingual manipulation;Reduced posterior propulsion;Lingual pumping Oral - Puree Weak lingual manipulation;Reduced posterior propulsion;Lingual pumping Oral - Mech Soft Weak lingual manipulation;Reduced posterior propulsion;Lingual pumping  Oral - Regular -- Oral - Multi-Consistency -- Oral - Pill NT Oral Phase - Comment --  CHL IP PHARYNGEAL PHASE 10/05/2016 Pharyngeal Phase Impaired Pharyngeal- Pudding Teaspoon -- Pharyngeal -- Pharyngeal- Pudding Cup -- Pharyngeal -- Pharyngeal- Honey Teaspoon -- Pharyngeal -- Pharyngeal- Honey Cup -- Pharyngeal -- Pharyngeal- Nectar Teaspoon Delayed swallow initiation-pyriform sinuses Pharyngeal -- Pharyngeal- Nectar Cup Delayed swallow initiation-pyriform sinuses Pharyngeal -- Pharyngeal- Nectar Straw Delayed swallow initiation-pyriform sinuses;Pharyngeal residue -  valleculae;Pharyngeal residue - pyriform Pharyngeal -- Pharyngeal- Thin Teaspoon Delayed swallow initiation-pyriform sinuses;Pharyngeal residue - valleculae;Pharyngeal residue - pyriform Pharyngeal -- Pharyngeal- Thin Cup Delayed swallow initiation-pyriform sinuses;Pharyngeal residue - valleculae;Pharyngeal residue - pyriform Pharyngeal -- Pharyngeal- Thin Straw Delayed swallow initiation-pyriform sinuses;Pharyngeal residue - valleculae;Pharyngeal residue - pyriform Pharyngeal -- Pharyngeal- Puree Delayed swallow initiation-pyriform sinuses;Pharyngeal residue - valleculae;Pharyngeal residue - pyriform Pharyngeal -- Pharyngeal- Mechanical Soft Delayed swallow initiation-pyriform sinuses;Pharyngeal residue - valleculae;Pharyngeal residue - pyriform Pharyngeal -- Pharyngeal- Regular NT Pharyngeal -- Pharyngeal- Multi-consistency -- Pharyngeal -- Pharyngeal- Pill -- Pharyngeal -- Pharyngeal Comment --  CHL IP CERVICAL ESOPHAGEAL PHASE 10/05/2016 Cervical Esophageal Phase -- Pudding Teaspoon -- Pudding Cup -- Honey Teaspoon -- Honey Cup -- Nectar Teaspoon -- Nectar Cup -- Nectar Straw -- Thin Teaspoon -- Thin Cup -- Thin Straw -- Puree -- Mechanical Soft -- Regular -- Multi-consistency -- Pill -- Cervical Esophageal Comment appearance of delayed clearance distally, appearance of backflow with belching - without pt sensation, suspect primary esophageal dysphagia - radiologist not present to confirm No flowsheet data found. Luanna Salk, MS [facility] SLP [phone]              Dg Swallowing Func-speech Pathology  Result Date: 09/22/2016 Objective Swallowing Evaluation: Type of Study: MBS-Modified Barium Swallow Study Patient Details Name: MEIKA EARLL MRN: [id number] Date of Birth: Dec 03, 1940 Today's Date: 09/22/2016 Time: SLP Start Time (ACUTE ONLY): 1400-SLP Stop Time (ACUTE ONLY): 1420 SLP Time Calculation (min) (ACUTE ONLY): 20 min Past Medical History: Past Medical History: Diagnosis Date . Abdominal or pelvic  swelling, mass, or lump, left upper quadrant  . CHF (congestive heart failure) ([place])  . Chronic atrial fibrillation ([place])  . Chronic kidney disease  . Congestive heart failure, unspecified  . Coronary atherosclerosis of unspecified type of vessel, native or graft  . Diabetes mellitus  . Edema  . GERD (gastroesophageal reflux disease)  . Gout, unspecified  . Hypercholesterolemia  . Hypertension  . 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 . Peripheral vascular disease, unspecified ([place])  . Seizures ([place])  . Sinus of Valsalva aneurysm   a. By 2D echo 05/2011. . Stroke ([place])  . Unspecified hereditary and idiopathic peripheral neuropathy  . Unspecified urinary incontinence  . Vascular dementia, uncomplicated  Past Surgical History: Past Surgical History: Procedure Laterality Date . AORTOGRAM  09/01/2008  Abd w/ bilateral lower extremity runoff arteriography . APPLICATION OF WOUND VAC Left 09/04/6809  Procedure: APPLICATION OF WOUND VAC LEFT GROIN;  Surgeon: Serafina Mitchell, MD;  Location: [place];  Service: Vascular;  Laterality: Left; . ENDARTERECTOMY FEMORAL Left 08/30/2016  Procedure: LEFT COMMON FEMORAL ARTERY ENDARTERECTOMY;  Surgeon: Serafina Mitchell, MD;  Location: [place];  Service: Vascular;  Laterality: Left; . FALSE ANEURYSM REPAIR  12/06/2011  Procedure: REPAIR FALSE ANEURYSM;  Surgeon: Angelia Mould, MD;  Location: [facility] OR;  Service: Vascular;  Laterality: Left;  Repair of left femoral Artery pseudoaneurysm .  FALSE ANEURYSM REPAIR Left 08/30/2016  Procedure: REPAIR PSEUDOANEURYSM LEFT GROIN;  Surgeon: Serafina Mitchell, MD;  Location: [place];  Service: Vascular;  Laterality: Left; . FEMORAL ARTERY EXPLORATION  12/06/2011  Procedure: FEMORAL ARTERY EXPLORATION;  Surgeon: Angelia Mould, MD;  Location: [facility] OR;  Service: Vascular;  Laterality: N/A;  Exploration of large  pseudoaneurysm left side of fem-fem bypass graft  . femoral to femoral bypass graft    . FEMORAL-FEMORAL BYPASS GRAFT  12/06/2011  Procedure: BYPASS GRAFT FEMORAL-FEMORAL ARTERY;  Surgeon: Angelia Mould, MD;  Location: [facility] OR;  Service: Vascular;  Laterality: Bilateral;  Revision of left to right Femoral-Femoral bypass graft . FEMORAL-FEMORAL BYPASS GRAFT Left 08/30/2016  Procedure: REDO RIGHT TO LEFT FEMORAL-FEMORAL ARTERY BYPASS GRAFT;  Surgeon: Serafina Mitchell, MD;  Location: [place];  Service: Vascular;  Laterality: Left; . FEMORAL-POPLITEAL BYPASS GRAFT Left 05/2002  lower extremity femoral to below knee w/ non-versed greater saphenous vein . FEMORAL-POPLITEAL BYPASS GRAFT Left 11/2002 . FEMORAL-POPLITEAL BYPASS GRAFT Left 08/30/2016  Procedure: REVISION OF LEFT FEMORAL-POPLITEAL ARTERY BYPASS GRAFT;  Surgeon: Serafina Mitchell, MD;  Location: [place];  Service: Vascular;  Laterality: Left; . THROMBECTOMY FEMORAL ARTERY Left 08/30/2016  Procedure: THROMBECTOMY OF FEMORAL-FEMORAL ARTERY BYPASS GRAFT AND LEFT FEMORAL TO LEFT POPLITEAL BYPASS GRAFT;  Surgeon: Serafina Mitchell, MD;  Location: [place];  Service: Vascular;  Laterality: Left; . TUBAL LIGATION Bilateral  HPI: Sakara A Kittrellis a 76 y.o.femalewith a past medical history significant for lymphoma, dementia, pAF not on anticoag, CVA with R hemiparesis, CHF EF 35%, HTN, PVD s/p fem-fem bypass, seizures and legally blindwho presents with AMS, Pt found to have pna and required intubation from 8/17-8/22.  Swallow evaluation ordered.  Subjective: upright in Hausted chair Assessment / Plan / Recommendation CHL IP CLINICAL IMPRESSIONS 09/22/2016 Clinical Impression Patient presents with mild oral, moderate pharyngeal and suspected primary esophageal dysphagia. Deviations in swallow physiology include impaired pharyngeal sensation which is likely multifactorial given pt's intubation, hx of GERD. This resulted in delayed swallow initiation with liquids, which at  times did not occur until liquids were already in pt's cervical esophagus (UES open at rest, NG present). This resulted in 1 episode of trace, sensed aspiration of thin liquids which ocurred during the swallow. Oral stage was largely functional with the exception of pt's difficulty transiting barium tablet; she was unable to swallow whole or when broken into smaller pieces, though this is likely secondary to esophageal dysphagia, as pt was repeatedly belching and gagging slightly during attempts. Pharyngeal stage characterized by adequate tongue base retraction and hyolaryngeal excursion, reduced pharyngeal stripping wave suspect secondary to suboptimal esophageal pressures. Cervical esophageal phase notable for intermittent backflow into the cervical esophagus and pharynx with thin and nectar thick liquids, regular solids. Frequent wet belching with thin liquids as well as delayed coughing which did not appear to be associated with penetration/aspiration events, however penetration/aspiration of esophageal backflow between frames not excluded. An esophageal sweep with nectar thick liquids revealed a standing column of barium in the upper esophagus, with retrograde bolus flow through the UES into the pharynx. Recommend pt remain NPO with NG at this time; she may benefit from GI referral, esophageal workup. SLP will f/u for readiness for PO/repeat instrumental pending follow-up for her suspected primary esophageal dysphagia.  SLP Visit Diagnosis Dysphagia, oropharyngeal phase (R13.12);Dysphagia, pharyngoesophageal phase (R13.14) Attention and concentration deficit following -- Frontal lobe and executive function deficit following -- Impact on safety and function Severe aspiration risk  CHL IP TREATMENT RECOMMENDATION 09/22/2016 Treatment Recommendations F/U MBS in --- days (Comment);Therapy as outlined in treatment plan below   Prognosis 09/22/2016 Prognosis for Safe Diet Advancement Guarded Barriers to Reach Goals  Severity of deficits;Cognitive deficits Barriers/Prognosis Comment -- CHL IP DIET RECOMMENDATION 09/22/2016 SLP Diet Recommendations NPO;Alternative means - temporary Liquid Administration via -- Medication Administration Via alternative means Compensations -- Postural Changes --   CHL IP OTHER RECOMMENDATIONS 09/22/2016 Recommended Consults Consider GI evaluation;Consider esophageal assessment Oral Care Recommendations Oral care QID Other Recommendations --   CHL IP FOLLOW UP RECOMMENDATIONS 09/22/2016 Follow up Recommendations Other (comment)   CHL IP FREQUENCY AND DURATION 09/22/2016 Speech Therapy Frequency (ACUTE ONLY) min 2x/week Treatment Duration 2 weeks      CHL IP ORAL PHASE 09/22/2016 Oral Phase Impaired Oral - Pudding Teaspoon -- Oral - Pudding Cup -- Oral - Honey Teaspoon -- Oral - Honey Cup -- Oral - Nectar Teaspoon -- Oral - Nectar Cup -- Oral - Nectar Straw -- Oral - Thin Teaspoon -- Oral - Thin Cup -- Oral - Thin Straw -- Oral - Puree -- Oral - Mech Soft -- Oral - Regular -- Oral - Multi-Consistency -- Oral - Pill Lingual pumping;Reduced posterior propulsion;Holding of bolus;Lingual/palatal residue Oral Phase - Comment --  CHL IP PHARYNGEAL PHASE 09/22/2016 Pharyngeal Phase Impaired Pharyngeal- Pudding Teaspoon -- Pharyngeal -- Pharyngeal- Pudding Cup -- Pharyngeal -- Pharyngeal- Honey Teaspoon -- Pharyngeal -- Pharyngeal- Honey Cup -- Pharyngeal -- Pharyngeal- Nectar Teaspoon -- Pharyngeal -- Pharyngeal- Nectar Cup Delayed swallow initiation-pyriform sinuses Pharyngeal -- Pharyngeal- Nectar Straw -- Pharyngeal -- Pharyngeal- Thin Teaspoon -- Pharyngeal -- Pharyngeal- Thin Cup Delayed swallow initiation-pyriform sinuses;Reduced pharyngeal peristalsis Pharyngeal -- Pharyngeal- Thin Straw Reduced pharyngeal peristalsis Pharyngeal -- Pharyngeal- Puree Delayed swallow initiation-vallecula;Reduced pharyngeal peristalsis Pharyngeal -- Pharyngeal- Mechanical Soft -- Pharyngeal -- Pharyngeal- Regular Delayed  swallow initiation-vallecula;Reduced pharyngeal peristalsis Pharyngeal -- Pharyngeal- Multi-consistency -- Pharyngeal -- Pharyngeal- Pill -- Pharyngeal -- Pharyngeal Comment --  CHL IP CERVICAL ESOPHAGEAL PHASE 09/22/2016 Cervical Esophageal Phase Impaired Pudding Teaspoon -- Pudding Cup -- Honey Teaspoon -- Honey Cup -- Nectar Teaspoon -- Nectar Cup Esophageal backflow into cervical esophagus;Esophageal backflow into the pharynx Nectar Straw -- Thin Teaspoon -- Thin Cup Esophageal backflow into cervical esophagus Thin Straw Esophageal backflow into cervical esophagus Puree Esophageal backflow into cervical esophagus Mechanical Soft -- Regular Esophageal backflow into the pharynx;Esophageal backflow into cervical esophagus Multi-consistency -- Pill -- Cervical Esophageal Comment -- Deneise Lever, MS, CCC-SLP Speech-Language Pathologist [phone] No flowsheet data found. Aliene Altes 09/22/2016, 3:44 PM               2-D echo Study Conclusions  - Left ventricle: The cavity size was normal. Wall thickness was   increased in a pattern of mild LVH. Systolic function was   severely reduced. The estimated ejection fraction was in the   range of 25% to 30%. Diffuse hypokinesis. - Aortic valve: There was mild regurgitation. - Mitral valve: There was mild regurgitation. - Left atrium: The atrium was severely dilated. - Right atrium: The atrium was severely dilated. - Tricuspid valve: There was moderate regurgitation. - Pulmonary arteries: Systolic pressure was moderately increased.   PA peak pressure: 53 mm Hg (S).  Impressions:  - Severe global reduction in LV systolic function; mild LVH; mild   AI; mild MR; severe biatrial enlargement; mild to moderate TR   with moderately elevated pulmonary pressure.   Subjective: Patient is able to answer a few simple commands. Heart rate stable  on the monitor.  Discharge Exam: Vitals:   10/05/16 0800 10/05/16 0900  BP: (!) 138/50   Pulse:    Resp:  (!) 24 (!) 28  Temp: 98.7 F (37.1 C)   SpO2: 98%    Vitals:   10/05/16 0600 10/05/16 0700 10/05/16 0800 10/05/16 0900  BP: (!) 145/65 (!) 119/48 (!) 138/50   Pulse:      Resp: (!) 25 (!) 27 (!) 24 (!) 28  Temp:   98.7 F (37.1 C)   TempSrc:   Axillary   SpO2: 98% 100% 98%   Weight:      Height:         Gen.: Elderly female frail, poorly communicative, nondistended in distress HEENT: Pallor present, temporal wasting, supple neck, poor dentition Chest: Clear bilaterally CVS: S1 and S2 regular, no murmurs GI: Soft, nondistended, nontender, bowel sounds present, Foley + Musculoskeletal: Warm, no edema, left arm PICC CNS:  arousable and answers to simple commands.  The results of significant diagnostics from this hospitalization (including imaging, microbiology, ancillary and laboratory) are listed below for reference.     Microbiology: Recent Results (from the past 240 hour(s))  Culture, Urine     Status: Abnormal   Collection Time: 09/30/16  6:03 PM  Result Value Ref Range Status   Specimen Description URINE, CATHETERIZED  Final   Special Requests NONE  Final   Culture >=100,000 COLONIES/mL YEAST (A)  Final   Report Status 10/02/2016 FINAL  Final     Labs: BNP (last 3 results)  Recent Labs  09/14/16 1050  BNP 616.0*   Basic Metabolic Panel:  Recent Labs Lab 10/01/16 0337 10/02/16 0352 10/03/16 0423 10/04/16 0812 10/05/16 0450  NA 158* 151* 147* 144 144  K 4.8 3.9 3.2* 4.4 4.9  CL 126* 121* 120* 118* 119*  CO2 21* 21* 20* 18* 20*  GLUCOSE 233* 278* 209* 151* 100*  BUN 111* 117* 104* 70* 51*  CREATININE 4.41* 4.39* 3.29* 2.32* 1.79*  CALCIUM 9.5 9.1 9.0 9.3 8.9  MG  --   --  0.6* 2.3  --    Liver Function Tests:  Recent Labs Lab 10/03/16 0423  AST 19  ALT 11*  ALKPHOS 36*  BILITOT 0.8  PROT 4.3*  ALBUMIN 2.0*   No results for input(s): LIPASE, AMYLASE in the last 168 hours. No results for input(s): AMMONIA in the last 168  hours. CBC:  Recent Labs Lab 10/02/16 0352 10/02/16 1528 10/03/16 0423 10/04/16 0456 10/05/16 0450  WBC 20.7* 19.1* 14.7* 11.1* 10.9*  HGB 5.9* 7.8* 7.3* 6.5* 7.9*  HCT 17.8* 22.5* 21.0* 19.4* 23.6*  MCV 82.8 84.0 83.3 85.5 85.2  PLT 110* 91* 82* 67* 80*   Cardiac Enzymes:  Recent Labs Lab 10/02/16 1738  TROPONINI 0.04*   BNP: Invalid input(s): POCBNP CBG:  Recent Labs Lab 10/04/16 1719 10/04/16 1928 10/04/16 2322 10/05/16 0342 10/05/16 0744  GLUCAP 115* 141* 139* 75 129*   D-Dimer No results for input(s): DDIMER in the last 72 hours. Hgb A1c No results for input(s): HGBA1C in the last 72 hours. Lipid Profile No results for input(s): CHOL, HDL, LDLCALC, TRIG, CHOLHDL, LDLDIRECT in the last 72 hours. Thyroid function studies No results for input(s): TSH, T4TOTAL, T3FREE, THYROIDAB in the last 72 hours.  Invalid input(s): FREET3 Anemia work up No results for input(s): VITAMINB12, FOLATE, FERRITIN, TIBC, IRON, RETICCTPCT in the last 72 hours. Urinalysis    Component Value Date/Time   COLORURINE YELLOW 09/30/2016 1803   APPEARANCEUR  CLOUDY (A) 09/30/2016 1803   LABSPEC 1.017 09/30/2016 1803   PHURINE 5.0 09/30/2016 1803   GLUCOSEU NEGATIVE 09/30/2016 1803   HGBUR SMALL (A) 09/30/2016 1803   BILIRUBINUR SMALL (A) 09/30/2016 1803   KETONESUR NEGATIVE 09/30/2016 1803   PROTEINUR 30 (A) 09/30/2016 1803   UROBILINOGEN 0.2 02/02/2013 2008   NITRITE NEGATIVE 09/30/2016 1803   LEUKOCYTESUR LARGE (A) 09/30/2016 1803   Sepsis Labs Invalid input(s): PROCALCITONIN,  WBC,  LACTICIDVEN Microbiology Recent Results (from the past 240 hour(s))  Culture, Urine     Status: Abnormal   Collection Time: 09/30/16  6:03 PM  Result Value Ref Range Status   Specimen Description URINE, CATHETERIZED  Final   Special Requests NONE  Final   Culture >=100,000 COLONIES/mL YEAST (A)  Final   Report Status 10/02/2016 FINAL  Final     Time coordinating discharge: Over 30  minutes  SIGNED:   Louellen Molder, MD  [facility] 10/05/2016, 11:49 AM Pager   If 7PM-7AM, please contact night-coverage [email or web address] Password [email or web address]

## 2016-10-05 NOTE — Care Management Note (Signed)
Case Management Note  Patient Details  Name: Casey [name] MRN: [id number] Date of Birth: [birth year]  Subjective/Objective:                  Transferred to [facility] per patient and family wishes.  Action/Plan: Date: October 05, 2016 Chart reviewed for discharge orders: None found for case management. Vernia Buff, [phone]  Expected Discharge Date:  10/05/16               Expected Discharge Plan:  Long Term Acute Care (LTAC)  In-House Referral:  Clinical Social Work  Discharge planning Services  CM Consult  Post Acute Care Choice:    Choice offered to:     DME Arranged:    DME Agency:     HH Arranged:    HH Agency:     Status of Service:  Completed, signed off  If discussed at [employer], dates discussed:    Additional Comments:  [name], RN 10/05/2016, 2:04 PM

## 2016-10-05 NOTE — Progress Notes (Signed)
Modified Barium Swallow Progress Note  Patient Details  Name: Casey [name] MRN: [id number] Date of Birth: October 30, 1940  Today's Date: 10/05/2016  Modified Barium Swallow completed.  Full report located under Chart Review in the Imaging Section.  Brief recommendations include the following:  Clinical Impression  Patient presents with mild oropharyngeal and suspected primary esophageal dysphagia. Decreased oral coordination results in premature spillage of barium into pharynx and daly in transiting of pudding/solid.  Pt pharyngeal swallow largely strong with only minimal residuals with liquids moer than solids - suspected esophageal pressure issue.  Mild pharyngeal residuals noted that cleared with reflexive swallows.  Trace upper laryngeal penetration of thin that was mixed with secretions noted.  Cued throat clear effectively cleared trace penetration.   Suspect pt's lack of cortrak feeding tube positively impacted swallow ability.    Pt was repeatedly belching during MBS with appearance of retrograde propulsion to thoracic esophagus.  Suspect this is the patient's primary aspiration risk.    Recommend consider full liquid diet with strict aspiration precautions.  Pt may benefit from GI referral, esophageal workup given pt with belching and suspected esophageal dysphagia.   Swallow Evaluation Recommendations   Recommended Consults: Consider GI evaluation;Consider esophageal assessment   SLP Diet Recommendations: Thin liquid (consider full liquids)                       Oral Care Recommendations: Oral care QID       Luanna Salk, MS [facility] SLP [phone]  Casey [name] 10/05/2016,9:49 AM

## 2016-10-06 LAB — CBC WITH DIFFERENTIAL/PLATELET
BASOS PCT: 0 %
Basophils Absolute: 0 10*3/uL (ref 0.0–0.1)
EOS ABS: 0.4 10*3/uL (ref 0.0–0.7)
Eosinophils Relative: 4 %
HEMATOCRIT: 31 % — AB (ref 36.0–46.0)
HEMOGLOBIN: 9.5 g/dL — AB (ref 12.0–15.0)
LYMPHS ABS: 1.2 10*3/uL (ref 0.7–4.0)
Lymphocytes Relative: 11 %
MCH: 26.8 pg (ref 26.0–34.0)
MCHC: 30.6 g/dL (ref 30.0–36.0)
MCV: 87.6 fL (ref 78.0–100.0)
MONOS PCT: 10 %
Monocytes Absolute: 1.1 10*3/uL — ABNORMAL HIGH (ref 0.1–1.0)
NEUTROS ABS: 8 10*3/uL — AB (ref 1.7–7.7)
NEUTROS PCT: 75 %
Platelets: 148 10*3/uL — ABNORMAL LOW (ref 150–400)
RBC: 3.54 MIL/uL — ABNORMAL LOW (ref 3.87–5.11)
RDW: 18.8 % — ABNORMAL HIGH (ref 11.5–15.5)
WBC: 10.6 10*3/uL — AB (ref 4.0–10.5)

## 2016-10-06 LAB — TSH: TSH: 2.708 u[IU]/mL (ref 0.350–4.500)

## 2016-10-06 LAB — MAGNESIUM: MAGNESIUM: 1.9 mg/dL (ref 1.7–2.4)

## 2016-10-06 LAB — COMPREHENSIVE METABOLIC PANEL
ALK PHOS: 70 U/L (ref 38–126)
ALT: 16 U/L (ref 14–54)
AST: 29 U/L (ref 15–41)
Albumin: 2.6 g/dL — ABNORMAL LOW (ref 3.5–5.0)
Anion gap: 8 (ref 5–15)
BILIRUBIN TOTAL: 0.8 mg/dL (ref 0.3–1.2)
BUN: 35 mg/dL — AB (ref 6–20)
CALCIUM: 9.5 mg/dL (ref 8.9–10.3)
CHLORIDE: 115 mmol/L — AB (ref 101–111)
CO2: 21 mmol/L — ABNORMAL LOW (ref 22–32)
CREATININE: 1.76 mg/dL — AB (ref 0.44–1.00)
GFR, EST AFRICAN AMERICAN: 31 mL/min — AB (ref >60)
GFR, EST NON AFRICAN AMERICAN: 27 mL/min — AB (ref >60)
Glucose, Bld: 96 mg/dL (ref 65–99)
Potassium: 4.7 mmol/L (ref 3.5–5.1)
Sodium: 144 mmol/L (ref 135–145)
TOTAL PROTEIN: 6 g/dL — AB (ref 6.5–8.1)

## 2016-10-06 LAB — PROTIME-INR
INR: 1.09
PROTHROMBIN TIME: 14 s (ref 11.4–15.2)

## 2016-10-06 LAB — HEMOGLOBIN A1C
Hgb A1c MFr Bld: 5.6 % (ref 4.8–5.6)
MEAN PLASMA GLUCOSE: 114.02 mg/dL

## 2016-10-06 LAB — PHOSPHORUS: Phosphorus: 3.4 mg/dL (ref 2.5–4.6)

## 2016-10-08 LAB — BPAM RBC
BLOOD PRODUCT EXPIRATION DATE: 201809122359
BLOOD PRODUCT EXPIRATION DATE: 201809122359
BLOOD PRODUCT EXPIRATION DATE: 201809192359
ISSUE DATE / TIME: 201809061229
ISSUE DATE / TIME: 201809061229
UNIT TYPE AND RH: 1700
UNIT TYPE AND RH: 7300
Unit Type and Rh: 1700

## 2016-10-08 LAB — TYPE AND SCREEN
ABO/RH(D): B POS
Antibody Screen: NEGATIVE
UNIT DIVISION: 0
UNIT DIVISION: 0
Unit division: 0

## 2016-10-08 LAB — C DIFFICILE QUICK SCREEN W PCR REFLEX
C Diff antigen: NEGATIVE
C Diff interpretation: NOT DETECTED
C Diff toxin: NEGATIVE

## 2016-10-10 LAB — BASIC METABOLIC PANEL
Anion gap: 5 (ref 5–15)
BUN: 32 mg/dL — AB (ref 6–20)
CHLORIDE: 111 mmol/L (ref 101–111)
CO2: 22 mmol/L (ref 22–32)
CREATININE: 1.64 mg/dL — AB (ref 0.44–1.00)
Calcium: 8.8 mg/dL — ABNORMAL LOW (ref 8.9–10.3)
GFR calc Af Amer: 34 mL/min — ABNORMAL LOW (ref >60)
GFR calc non Af Amer: 29 mL/min — ABNORMAL LOW (ref >60)
GLUCOSE: 267 mg/dL — AB (ref 65–99)
Potassium: 5.3 mmol/L — ABNORMAL HIGH (ref 3.5–5.1)
SODIUM: 138 mmol/L (ref 135–145)

## 2016-10-10 LAB — CBC
HCT: 30.1 % — ABNORMAL LOW (ref 36.0–46.0)
HEMOGLOBIN: 9.3 g/dL — AB (ref 12.0–15.0)
MCH: 27.5 pg (ref 26.0–34.0)
MCHC: 30.9 g/dL (ref 30.0–36.0)
MCV: 89.1 fL (ref 78.0–100.0)
Platelets: 207 10*3/uL (ref 150–400)
RBC: 3.38 MIL/uL — ABNORMAL LOW (ref 3.87–5.11)
RDW: 18.1 % — ABNORMAL HIGH (ref 11.5–15.5)
WBC: 14 10*3/uL — ABNORMAL HIGH (ref 4.0–10.5)

## 2016-10-10 LAB — POTASSIUM: POTASSIUM: 4.8 mmol/L (ref 3.5–5.1)

## 2016-10-10 LAB — C DIFFICILE QUICK SCREEN W PCR REFLEX
C Diff antigen: NEGATIVE
C Diff interpretation: NOT DETECTED
C Diff toxin: NEGATIVE

## 2016-10-11 ENCOUNTER — Other Ambulatory Visit (HOSPITAL_COMMUNITY): Payer: Medicare Other

## 2016-10-12 ENCOUNTER — Other Ambulatory Visit (HOSPITAL_COMMUNITY): Payer: Medicare Other

## 2016-10-12 LAB — BASIC METABOLIC PANEL
ANION GAP: 4 — AB (ref 5–15)
BUN: 24 mg/dL — ABNORMAL HIGH (ref 6–20)
CO2: 27 mmol/L (ref 22–32)
Calcium: 9.1 mg/dL (ref 8.9–10.3)
Chloride: 109 mmol/L (ref 101–111)
Creatinine, Ser: 1.47 mg/dL — ABNORMAL HIGH (ref 0.44–1.00)
GFR calc Af Amer: 39 mL/min — ABNORMAL LOW (ref >60)
GFR, EST NON AFRICAN AMERICAN: 33 mL/min — AB (ref >60)
Glucose, Bld: 161 mg/dL — ABNORMAL HIGH (ref 65–99)
POTASSIUM: 4.5 mmol/L (ref 3.5–5.1)
SODIUM: 140 mmol/L (ref 135–145)

## 2016-10-12 LAB — CBC
HCT: 27.9 % — ABNORMAL LOW (ref 36.0–46.0)
Hemoglobin: 8.7 g/dL — ABNORMAL LOW (ref 12.0–15.0)
MCH: 27.3 pg (ref 26.0–34.0)
MCHC: 31.2 g/dL (ref 30.0–36.0)
MCV: 87.5 fL (ref 78.0–100.0)
PLATELETS: 242 10*3/uL (ref 150–400)
RBC: 3.19 MIL/uL — AB (ref 3.87–5.11)
RDW: 17.5 % — ABNORMAL HIGH (ref 11.5–15.5)
WBC: 10 10*3/uL (ref 4.0–10.5)

## 2016-10-13 ENCOUNTER — Other Ambulatory Visit (HOSPITAL_COMMUNITY): Payer: Medicare Other

## 2016-10-16 LAB — PROTIME-INR
INR: 1.06
Prothrombin Time: 13.7 seconds (ref 11.4–15.2)

## 2016-10-16 NOTE — Progress Notes (Signed)
Referring Physician(s):  Dr. Laren Everts  Supervising Physician: Markus Daft  Patient Status:  [facility] - In-pt  Chief Complaint:  Dysphagia  Subjective: Patient known to IR service from previous admission and work-up for possible gastrostomy placement.  Patient was approved for procedure but medical status and overall goals at the time resulted in cancellation of procedure. Patient has stabilized and has now been transferred to [facility].  Her daughter who is her HCPOA continues to desire gastrostomy placement.   IR consulted for gastrostomy tube placement.   Allergies: Penicillins  Medications: Prior to Admission medications   Medication Sig Start Date End Date Taking? Authorizing Provider  acetaminophen (TYLENOL) 325 MG tablet Take 2 tablets (650 mg total) by mouth every 6 (six) hours as needed for mild pain (or Fever >/= 101). 09/02/16   Osei-Bonsu, Iona Beard, MD  allopurinol (ZYLOPRIM) 100 MG tablet Take 100 mg by mouth daily.    [provider]  atorvastatin (LIPITOR) 10 MG tablet Take 10 mg by mouth at bedtime.     [provider]  bisacodyl (BISCOLAX) 10 MG suppository Place 10 mg rectally as needed for moderate constipation.    [provider]  CETYLPYRIDINIUM CHLORIDE MT Give 15 ml by mouth two times a day for oral irritation    [provider]  donepezil (ARICEPT) 10 MG tablet Take 1 tablet (10 mg total) by mouth at bedtime. 02/07/13   Isaac Bliss, Rayford Halsted, MD  ipratropium-albuterol (DUONEB) 0.5-2.5 (3) MG/3ML SOLN Take 3 mLs by nebulization every 4 (four) hours as needed. 10/05/16   Dhungel, Flonnie Overman, MD  levETIRAcetam (KEPPRA) 250 MG tablet Take 250 mg by mouth 2 (two) times daily.    [provider]  metoprolol tartrate (LOPRESSOR) 25 MG tablet Give 2 tablets (50mg ) by mouth in the morning and 1 tablet by mouth at bedtime    [provider]  mirtazapine (REMERON) 7.5 MG tablet Take 7.5 mg by mouth at bedtime.    [provider]  oxyCODONE (OXY IR/ROXICODONE) 5 MG immediate release tablet Take 1 tablet (5 mg total) by mouth every 6 (six) hours as needed for severe pain. 09/09/16   Dhungel, Nishant, MD  pantoprazole (PROTONIX) 40 MG tablet Take 40 mg by mouth daily.    [provider]     Vital Signs: There were no vitals taken for this visit.  Physical Exam  Constitutional: She appears well-developed.  Cardiovascular: Normal rate and normal heart sounds.   Pulmonary/Chest: Effort normal and breath sounds normal. No respiratory distress.  Abdominal: Soft.  Neurological: She is alert.  Skin: Skin is warm and dry.  Nursing note and vitals reviewed.   Imaging: Dg Abd Portable 1v  Result Date: 10/13/2016 CLINICAL DATA:  Encounter for nasogastric tube placement. EXAM: PORTABLE ABDOMEN - 1 VIEW COMPARISON:  Radiograph yesterday at 1248 hour FINDINGS: Tip and side port of the enteric tube below the diaphragm in the stomach. Normal bowel gas pattern. Enteric contrast throughout the colon. IMPRESSION: Tip and side port of the enteric tube below the diaphragm in the stomach. Electronically Signed   By: Jeb Levering M.D.   On: 10/13/2016 01:41    Labs:  CBC:  Recent Labs  10/05/16 0450 10/06/16 0452 10/10/16 0555 10/12/16 1147  WBC 10.9* 10.6* 14.0* 10.0  HGB 7.9* 9.5* 9.3* 8.7*  HCT 23.6* 31.0* 30.1* 27.9*  PLT 80* 148* 207 242    COAGS:  Recent Labs  09/15/16 1558  09/30/16 1040  10/03/16 0423 10/04/16  4076 10/05/16 0450 10/06/16 0452  INR 1.30  < >  --   < > 1.38 1.25 1.20 1.09  APTT 86*  --  47*  --   --   --   --   --   < > = values in this interval not displayed.  BMP:  Recent Labs  10/05/16 0450 10/06/16 0452 10/10/16 0555 10/10/16 1800 10/12/16 1147  NA 144 144 138  --  140  K 4.9 4.7 5.3* 4.8 4.5  CL 119* 115* 111  --  109  CO2 20* 21* 22  --  27  GLUCOSE 100* 96 267*  --  161*  BUN 51* 35* 32*  --  24*  CALCIUM 8.9 9.5 8.8*  --  9.1  CREATININE 1.79* 1.76*  1.64*  --  1.47*  GFRNONAA 26* 27* 29*  --  33*  GFRAA 31* 31* 34*  --  39*    LIVER FUNCTION TESTS:  Recent Labs  09/14/16 0535 09/15/16 0504 10/03/16 0423 10/06/16 0452  BILITOT 0.4 0.8 0.8 0.8  AST 20 27 19 29   ALT 12* 14 11* 16  ALKPHOS 82 69 36* 70  PROT 7.2 6.1* 4.3* 6.0*  ALBUMIN 3.2* 2.6* 2.0* 2.6*    Assessment and Plan: Dysphagia Patient with dysphagia and high risk for aspiration. IR consulted for gastrostomy tube placement.  Case reviewed and approved by Dr. Vernard Gambles.  Daughter contacted for consent.  Risks and benefits discussed with the patient including, but not limited to the need for a barium enema during the procedure, bleeding, infection, peritonitis, or damage to adjacent structures. All of the patient's questions were answered, patient is agreeable to proceed. Consent signed and in chart. She has been made NPO after midnight.  Please hold blood thinners until after procedure in IR.   Electronically Signed: Docia Barrier, PA 10/16/2016, 5:06 PM   I spent a total of 15 Minutes at the the patient's bedside AND on the patient's hospital floor or unit, greater than 50% of which was counseling/coordinating care for dysphagia.

## 2016-10-17 LAB — RENAL FUNCTION PANEL
Albumin: 2.3 g/dL — ABNORMAL LOW (ref 3.5–5.0)
Anion gap: 6 (ref 5–15)
BUN: 25 mg/dL — ABNORMAL HIGH (ref 6–20)
CHLORIDE: 103 mmol/L (ref 101–111)
CO2: 28 mmol/L (ref 22–32)
CREATININE: 1.36 mg/dL — AB (ref 0.44–1.00)
Calcium: 9.2 mg/dL (ref 8.9–10.3)
GFR, EST AFRICAN AMERICAN: 43 mL/min — AB (ref >60)
GFR, EST NON AFRICAN AMERICAN: 37 mL/min — AB (ref >60)
Glucose, Bld: 142 mg/dL — ABNORMAL HIGH (ref 65–99)
POTASSIUM: 3.2 mmol/L — AB (ref 3.5–5.1)
Phosphorus: 2.9 mg/dL (ref 2.5–4.6)
Sodium: 137 mmol/L (ref 135–145)

## 2016-10-17 LAB — CBC
HCT: 29 % — ABNORMAL LOW (ref 36.0–46.0)
HEMOGLOBIN: 9 g/dL — AB (ref 12.0–15.0)
MCH: 26.5 pg (ref 26.0–34.0)
MCHC: 31 g/dL (ref 30.0–36.0)
MCV: 85.3 fL (ref 78.0–100.0)
PLATELETS: 209 10*3/uL (ref 150–400)
RBC: 3.4 MIL/uL — AB (ref 3.87–5.11)
RDW: 16.9 % — ABNORMAL HIGH (ref 11.5–15.5)
WBC: 10.3 10*3/uL (ref 4.0–10.5)

## 2016-10-17 LAB — MAGNESIUM: MAGNESIUM: 1.8 mg/dL (ref 1.7–2.4)

## 2016-10-17 MED ORDER — VANCOMYCIN HCL IN DEXTROSE 1-5 GM/200ML-% IV SOLN: 1000.0000 mg | INTRAVENOUS | Status: AC

## 2016-10-18 ENCOUNTER — Encounter (HOSPITAL_COMMUNITY): Payer: Self-pay | Admitting: Interventional Radiology

## 2016-10-18 ENCOUNTER — Other Ambulatory Visit (HOSPITAL_COMMUNITY): Payer: Medicare Other

## 2016-10-18 HISTORY — PX: IR GASTROSTOMY TUBE MOD SED: IMG625

## 2016-10-18 LAB — BASIC METABOLIC PANEL
ANION GAP: 8 (ref 5–15)
BUN: 22 mg/dL — AB (ref 6–20)
CALCIUM: 9 mg/dL (ref 8.9–10.3)
CO2: 27 mmol/L (ref 22–32)
Chloride: 101 mmol/L (ref 101–111)
Creatinine, Ser: 1.18 mg/dL — ABNORMAL HIGH (ref 0.44–1.00)
GFR calc Af Amer: 51 mL/min — ABNORMAL LOW (ref >60)
GFR, EST NON AFRICAN AMERICAN: 44 mL/min — AB (ref >60)
Glucose, Bld: 163 mg/dL — ABNORMAL HIGH (ref 65–99)
POTASSIUM: 3.6 mmol/L (ref 3.5–5.1)
SODIUM: 136 mmol/L (ref 135–145)

## 2016-10-18 LAB — MAGNESIUM: MAGNESIUM: 1.8 mg/dL (ref 1.7–2.4)

## 2016-10-18 MED ORDER — LIDOCAINE HCL (PF) 1 % IJ SOLN
INTRAMUSCULAR | Status: DC | PRN
  Administered 2016-10-18: 5 mL

## 2016-10-18 MED ORDER — FENTANYL CITRATE (PF) 100 MCG/2ML IJ SOLN
INTRAMUSCULAR | Status: AC
  Filled 2016-10-18: qty 2

## 2016-10-18 MED ORDER — LIDOCAINE HCL (PF) 1 % IJ SOLN
INTRAMUSCULAR | Status: AC
  Filled 2016-10-18: qty 30

## 2016-10-18 MED ORDER — MIDAZOLAM HCL 2 MG/2ML IJ SOLN
INTRAMUSCULAR | Status: AC | PRN
  Administered 2016-10-18: 1 mg via INTRAVENOUS

## 2016-10-18 MED ORDER — IOPAMIDOL (ISOVUE-300) INJECTION 61%
INTRAVENOUS | Status: AC
  Administered 2016-10-18: 30 mL
  Filled 2016-10-18: qty 50

## 2016-10-18 MED ORDER — FENTANYL CITRATE (PF) 100 MCG/2ML IJ SOLN
INTRAMUSCULAR | Status: AC | PRN
  Administered 2016-10-18: 50 ug via INTRAVENOUS

## 2016-10-18 MED ORDER — GLUCAGON HCL RDNA (DIAGNOSTIC) 1 MG IJ SOLR
INTRAMUSCULAR | Status: AC
  Filled 2016-10-18: qty 1

## 2016-10-18 MED ORDER — MIDAZOLAM HCL 2 MG/2ML IJ SOLN
INTRAMUSCULAR | Status: AC
  Filled 2016-10-18: qty 2

## 2016-10-18 MED ORDER — VANCOMYCIN HCL IN DEXTROSE 1-5 GM/200ML-% IV SOLN
INTRAVENOUS | Status: AC
  Administered 2016-10-18: 1 g
  Filled 2016-10-18: qty 200

## 2016-10-18 NOTE — Sedation Documentation (Signed)
Patient is resting comfortably. 

## 2016-10-18 NOTE — Procedures (Signed)
Interventional Radiology Procedure Note  Procedure: Placement of percutaneous 20F pull-through gastrostomy tube. Complications: None Recommendations: - NPO except for sips and chips remainder of today and overnight - Maintain G-tube to LWS until tomorrow morning  - May advance diet as tolerated and begin using tube tomorrow morning  Signed,   Cathleen Yagi S. Wilson Dusenbery, DO   

## 2016-10-18 NOTE — Sedation Documentation (Signed)
Pt very agitated at this time. Procedure started

## 2016-10-25 ENCOUNTER — Other Ambulatory Visit (HOSPITAL_COMMUNITY): Payer: Medicare Other

## 2016-10-25 LAB — BASIC METABOLIC PANEL
ANION GAP: 8 (ref 5–15)
BUN: 42 mg/dL — ABNORMAL HIGH (ref 6–20)
CALCIUM: 10 mg/dL (ref 8.9–10.3)
CO2: 29 mmol/L (ref 22–32)
Chloride: 100 mmol/L — ABNORMAL LOW (ref 101–111)
Creatinine, Ser: 1.36 mg/dL — ABNORMAL HIGH (ref 0.44–1.00)
GFR calc Af Amer: 43 mL/min — ABNORMAL LOW (ref >60)
GFR, EST NON AFRICAN AMERICAN: 37 mL/min — AB (ref >60)
Glucose, Bld: 243 mg/dL — ABNORMAL HIGH (ref 65–99)
POTASSIUM: 3.8 mmol/L (ref 3.5–5.1)
SODIUM: 137 mmol/L (ref 135–145)

## 2016-10-25 LAB — CBC
HCT: 33.4 % — ABNORMAL LOW (ref 36.0–46.0)
Hemoglobin: 10.3 g/dL — ABNORMAL LOW (ref 12.0–15.0)
MCH: 26 pg (ref 26.0–34.0)
MCHC: 30.8 g/dL (ref 30.0–36.0)
MCV: 84.3 fL (ref 78.0–100.0)
PLATELETS: 420 10*3/uL — AB (ref 150–400)
RBC: 3.96 MIL/uL (ref 3.87–5.11)
RDW: 17 % — AB (ref 11.5–15.5)
WBC: 10.6 10*3/uL — AB (ref 4.0–10.5)

## 2016-10-27 ENCOUNTER — Other Ambulatory Visit (HOSPITAL_COMMUNITY): Payer: Medicare Other

## 2016-10-27 LAB — BASIC METABOLIC PANEL
Anion gap: 10 (ref 5–15)
BUN: 50 mg/dL — AB (ref 6–20)
CALCIUM: 9.7 mg/dL (ref 8.9–10.3)
CHLORIDE: 98 mmol/L — AB (ref 101–111)
CO2: 32 mmol/L (ref 22–32)
CREATININE: 1.44 mg/dL — AB (ref 0.44–1.00)
GFR, EST AFRICAN AMERICAN: 40 mL/min — AB (ref >60)
GFR, EST NON AFRICAN AMERICAN: 34 mL/min — AB (ref >60)
Glucose, Bld: 232 mg/dL — ABNORMAL HIGH (ref 65–99)
Potassium: 2.7 mmol/L — CL (ref 3.5–5.1)
SODIUM: 140 mmol/L (ref 135–145)

## 2016-10-27 LAB — MAGNESIUM: Magnesium: 1.9 mg/dL (ref 1.7–2.4)

## 2016-10-27 LAB — POTASSIUM: POTASSIUM: 4.6 mmol/L (ref 3.5–5.1)

## 2016-10-28 ENCOUNTER — Encounter: Payer: Self-pay | Admitting: Internal Medicine

## 2016-10-28 ENCOUNTER — Other Ambulatory Visit (HOSPITAL_COMMUNITY): Payer: Medicare Other

## 2016-10-28 LAB — LACTIC ACID, PLASMA: Lactic Acid, Venous: 2.1 mmol/L (ref 0.5–1.9)

## 2016-10-28 LAB — BASIC METABOLIC PANEL
Anion gap: 11 (ref 5–15)
BUN: 72 mg/dL — ABNORMAL HIGH (ref 6–20)
CALCIUM: 9.8 mg/dL (ref 8.9–10.3)
CHLORIDE: 98 mmol/L — AB (ref 101–111)
CO2: 29 mmol/L (ref 22–32)
CREATININE: 1.77 mg/dL — AB (ref 0.44–1.00)
GFR calc non Af Amer: 27 mL/min — ABNORMAL LOW (ref >60)
GFR, EST AFRICAN AMERICAN: 31 mL/min — AB (ref >60)
GLUCOSE: 250 mg/dL — AB (ref 65–99)
Potassium: 4.3 mmol/L (ref 3.5–5.1)
Sodium: 138 mmol/L (ref 135–145)

## 2016-10-28 LAB — CBC
HEMATOCRIT: 31.2 % — AB (ref 36.0–46.0)
HEMOGLOBIN: 9.3 g/dL — AB (ref 12.0–15.0)
MCH: 25.5 pg — AB (ref 26.0–34.0)
MCHC: 29.8 g/dL — AB (ref 30.0–36.0)
MCV: 85.7 fL (ref 78.0–100.0)
Platelets: 356 10*3/uL (ref 150–400)
RBC: 3.64 MIL/uL — ABNORMAL LOW (ref 3.87–5.11)
RDW: 17.7 % — AB (ref 11.5–15.5)
WBC: 16.7 10*3/uL — ABNORMAL HIGH (ref 4.0–10.5)

## 2016-10-28 NOTE — Progress Notes (Signed)
Patient ID: Casey [name], female   DOB: Aug 06, 1940, 76 y.o.   MRN: [id number]    HISTORY AND PHYSICAL   DATE: 09/13/16  Location:    [facility]   Place of Service: SNF (31)   Extended Emergency Contact Information Primary Emergency Contact: Armstrong,Pat Address: [address]          [place], [place] [address] Johnnette Litter of [place] Phone: [phone] Mobile Phone: [phone] Relation: Daughter Secondary Emergency Contact: Mcarthur Rossetti [place] of [place] Phone: [phone] Relation: Son  Advanced Directive information  FULL CODE  Chief Complaint  Patient presents with  . Readmit To SNF    from hospital 09/07/16 - 09/09/16    HPI:  76 yo female long term resident seen today for readmission into SNF following hospital stay for afib w RVR, wound d/c, AKI, PAD, DM, anemia. Wound vac applied. She will need abx Doxy through 09/16/16. Cardiology followed. afib w RVR controlled with IV cardizem --> metoprolol dose increased BID. Hgb 9.7->10; Cr 1.28-->1.5; albumin 3 at d/c. She presents to SNF for long term care.  Today she reports occasional pain. She has cough and congestion. No f/c. She is a poor historian due to dementia. Hx obtained from chart. Labs at SNF reveal Cr 1.2  Gout - controlled on allopurinol 100 mg daily. No recent flares  Hypertension - BP stable on lopressor 100 mg in the AM and 50 mg in the PM.   PAF - rate controlled on lopressor 100 mg in the AM and 50 mg in the PM; She takes ASA daily  Dementia - stable on aricept 10 mg daily; she gets nutritional supplements per facility protocol  DM - diet controlled. A1c 5.2%   Chronic systolic heart failure - worsening; EF 25-30% in Aug 2018 and was previously 35-40% in Jan 2015. She is not currently not on any meds for this.  Dyslipidemia - stable on lipitor. LDL 41; TG 131   Seizure d/o - stable on keppra 250 mg twice daily. No recent sz activity reported  Depression - stable on  zoloft 100 mg daily. She does receive benefit from this medication   Pulmonary nodule. Stable. 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 Stable on ASA 81 mg daily   Weight loss - currently on remeron 7.5 mg nightly and gets nutritional supplements per facility protocol  CKD - stage 3. Stable. Cr 1.2  Hx left femoral pseudoaneurysm - s/p  left fem to fem bypass pseudoaneurysm and  thrombectomy on 08-30-16. Followed by vascular sx    Anemia - stable. hgb 10    Past Medical History:  Diagnosis Date  . Abdominal or pelvic swelling, mass, or lump, left upper quadrant   . CHF (congestive heart failure) ([place])   . Chronic atrial fibrillation ([place])   . Chronic kidney disease   . Congestive heart failure, unspecified   . Coronary atherosclerosis of unspecified type of vessel, native or graft   . Diabetes mellitus   . Edema   . GERD (gastroesophageal reflux disease)   . Gout, unspecified   . Hypercholesterolemia   . Hypertension   . 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  . Peripheral vascular disease, unspecified ([place])   . Seizures ([place])   .  Sinus of Valsalva aneurysm    a. By 2D echo 05/2011.  . Stroke ([place])   . Unspecified hereditary and idiopathic peripheral neuropathy   . Unspecified urinary incontinence   . Vascular dementia, uncomplicated     Past Surgical History:  Procedure Laterality Date  . AORTOGRAM  09/01/2008   Abd w/ bilateral lower extremity runoff arteriography  . APPLICATION OF WOUND VAC Left 08/30/2016   Procedure: APPLICATION OF WOUND VAC LEFT GROIN;  Surgeon: Serafina Mitchell, MD;  Location: [facility];  Service: Vascular;  Laterality: Left;  . ENDARTERECTOMY FEMORAL Left 08/30/2016   Procedure: LEFT COMMON FEMORAL ARTERY ENDARTERECTOMY;  Surgeon:  Serafina Mitchell, MD;  Location: [facility];  Service: Vascular;  Laterality: Left;  . FALSE ANEURYSM REPAIR  12/06/2011   Procedure: REPAIR FALSE ANEURYSM;  Surgeon: Angelia Mould, MD;  Location: [facility] OR;  Service: Vascular;  Laterality: Left;  Repair of left femoral Artery pseudoaneurysm  . FALSE ANEURYSM REPAIR Left 08/30/2016   Procedure: REPAIR PSEUDOANEURYSM LEFT GROIN;  Surgeon: Serafina Mitchell, MD;  Location: [facility];  Service: Vascular;  Laterality: Left;  . FEMORAL ARTERY EXPLORATION  12/06/2011   Procedure: FEMORAL ARTERY EXPLORATION;  Surgeon: Angelia Mould, MD;  Location: [facility] OR;  Service: Vascular;  Laterality: N/A;  Exploration of large pseudoaneurysm left side of fem-fem bypass graft   . femoral to femoral bypass graft     . FEMORAL-FEMORAL BYPASS GRAFT  12/06/2011   Procedure: BYPASS GRAFT FEMORAL-FEMORAL ARTERY;  Surgeon: Angelia Mould, MD;  Location: [facility] OR;  Service: Vascular;  Laterality: Bilateral;  Revision of left to right Femoral-Femoral bypass graft  . FEMORAL-FEMORAL BYPASS GRAFT Left 08/30/2016   Procedure: REDO RIGHT TO LEFT FEMORAL-FEMORAL ARTERY BYPASS GRAFT;  Surgeon: Serafina Mitchell, MD;  Location: [facility];  Service: Vascular;  Laterality: Left;  . FEMORAL-POPLITEAL BYPASS GRAFT Left 05/2002   lower extremity femoral to below knee w/ non-versed greater saphenous vein  . FEMORAL-POPLITEAL BYPASS GRAFT Left 11/2002  . FEMORAL-POPLITEAL BYPASS GRAFT Left 08/30/2016   Procedure: REVISION OF LEFT FEMORAL-POPLITEAL ARTERY BYPASS GRAFT;  Surgeon: Serafina Mitchell, MD;  Location: [facility] OR;  Service: Vascular;  Laterality: Left;  . IR GASTROSTOMY TUBE MOD SED  10/18/2016  . THROMBECTOMY FEMORAL ARTERY Left 08/30/2016   Procedure: THROMBECTOMY OF FEMORAL-FEMORAL ARTERY BYPASS GRAFT AND LEFT FEMORAL TO LEFT POPLITEAL BYPASS GRAFT;  Surgeon: Serafina Mitchell, MD;  Location: [facility];  Service: Vascular;  Laterality: Left;  . TUBAL LIGATION Bilateral     Patient Care  Team: Gildardo Cranker, DO as PCP - General (Internal Medicine) Nyoka Cowden Phylis Bougie, NP as Nurse Practitioner ([place]) [facility], [place] ([place])  Social History   Social History  . Marital status: Widowed    Spouse name: N/A  . Number of children: N/A  . Years of education: N/A   Occupational History  . Not on file.   Social History Main Topics  . Smoking status: Former Smoker    Types: Cigarettes    Quit date: 01/29/2001  . Smokeless tobacco: Never Used  . Alcohol use No  . Drug use: No  . Sexual activity: No   Other Topics Concern  . Not on file   Social History Narrative  . No narrative on file     reports that she quit smoking about 15 years ago. Her smoking use included Cigarettes. She has never used smokeless tobacco. She reports that she does not drink alcohol or use  drugs.  Family History  Problem Relation Age of Onset  . Cancer Mother        Cervical  . Stroke Mother   . Diabetes Mother   . Stroke Father   . Heart disease Father    Family Status  Relation Status  . Mother Deceased at age 70       Cause of Death: Complications of diabetes  . Father Deceased       Cause of Death: Heart disease  . Brother Alive  . Daughter Alive  . Son Alive  . Sister Deceased  . Brother Deceased  . Brother Deceased    Immunization History  Administered Date(s) Administered  . Influenza Split 12/09/2011  . Influenza-Unspecified 11/27/2013, 03/08/2015, 11/03/2015  . PPD Test 01/26/2014, 10/14/2015  . Pneumococcal Polysaccharide-23 02/05/2013    Allergies  Allergen Reactions  . Penicillins Other (See Comments)    Told by a doctor to "not take" and on MAR as an allergy Has patient had a PCN reaction causing immediate rash, facial/tongue/throat swelling, SOB or lightheadedness with hypotension: Unknown Has patient had a PCN reaction causing severe rash involving mucus membranes or skin necrosis: Unknown Has patient had a PCN  reaction that required hospitalization: Unknown Has patient had a PCN reaction occurring within the last 10 years: Unknown If all of the above answers are "NO", then may proceed with Ceph    Medications: Patient's Medications  New Prescriptions   No medications on file  Previous Medications   ACETAMINOPHEN (TYLENOL) 325 MG TABLET    Take 2 tablets (650 mg total) by mouth every 6 (six) hours as needed for mild pain (or Fever >/= 101).   ALLOPURINOL (ZYLOPRIM) 100 MG TABLET    Take 100 mg by mouth daily.   ATORVASTATIN (LIPITOR) 10 MG TABLET    Take 10 mg by mouth at bedtime.    BISACODYL (BISCOLAX) 10 MG SUPPOSITORY    Place 10 mg rectally as needed for moderate constipation.   CETYLPYRIDINIUM CHLORIDE MT    Give 15 ml by mouth two times a day for oral irritation   DONEPEZIL (ARICEPT) 10 MG TABLET    Take 1 tablet (10 mg total) by mouth at bedtime.   IPRATROPIUM-ALBUTEROL (DUONEB) 0.5-2.5 (3) MG/3ML SOLN    Take 3 mLs by nebulization every 4 (four) hours as needed.   LEVETIRACETAM (KEPPRA) 250 MG TABLET    Take 250 mg by mouth 2 (two) times daily.   METOPROLOL TARTRATE (LOPRESSOR) 25 MG TABLET    Give 2 tablets (44m) by mouth in the morning and 1 tablet by mouth at bedtime   MIRTAZAPINE (REMERON) 7.5 MG TABLET    Take 7.5 mg by mouth at bedtime.   OXYCODONE (OXY IR/ROXICODONE) 5 MG IMMEDIATE RELEASE TABLET    Take 1 tablet (5 mg total) by mouth every 6 (six) hours as needed for severe pain.   PANTOPRAZOLE (PROTONIX) 40 MG TABLET    Take 40 mg by mouth daily.  Modified Medications   No medications on file  Discontinued Medications   No medications on file    Review of Systems  Unable to perform ROS: Dementia    Vitals:   09/13/16 1626  BP: 128/60  Pulse: 80  Temp: 97.6 F (36.4 C)  SpO2: 98%  Weight: 123 lb (55.8 kg)   Body mass index is 24.02 kg/m.  Physical Exam  Constitutional: She appears well-developed.  Frail appearing, sitting up in bed in NAD. No conversational  dyspnea. [place]  O2 intact  HENT:  Mouth/Throat: Oropharynx is clear and moist. No oropharyngeal exudate.  MMM; no oral thrush  Eyes: Pupils are equal, round, and reactive to light. No scleral icterus.  Neck: Neck supple. Carotid bruit is not present. No tracheal deviation present.  Cardiovascular: Normal rate, regular rhythm and intact distal pulses.  Exam reveals no gallop and no friction rub.   Murmur (1/6 SEM) heard. No LE edema b/l. no calf TTP.   Pulmonary/Chest: Effort normal. No stridor. No respiratory distress. She has wheezes (R>L anterior wheezing with prolonged expiratory phase). She has no rales. She exhibits no tenderness.  No rhonchi  Abdominal: Soft. Normal appearance and bowel sounds are normal. She exhibits no distension and no mass. There is no hepatomegaly. There is no tenderness. There is no rigidity, no rebound and no guarding. No hernia.  Musculoskeletal: She exhibits edema.  Lymphadenopathy:    She has no cervical adenopathy.  Neurological: She is alert.  Skin: Skin is warm and dry. No rash noted.  Left groin wound vac intact, no bleeding/redness; incisions intact with no wound dehiscence;   Psychiatric: She has a normal mood and affect. Her behavior is normal.     Labs reviewed: Abstract on 09/12/2016  Component Date Value Ref Range Status  . Glucose 09/11/2016 213   Final  . BUN 09/11/2016 16  4 - 21 Final  . Creatinine 09/11/2016 1.2* 0.5 - 1.1 Final  . Potassium 09/11/2016 4.4  3.4 - 5.3 Final  . Sodium 09/11/2016 141  137 - 147 Final  Admission on 09/07/2016, Discharged on 09/09/2016  Component Date Value Ref Range Status  . Sodium 09/07/2016 141  135 - 145 mmol/L Final  . Potassium 09/07/2016 3.8  3.5 - 5.1 mmol/L Final  . Chloride 09/07/2016 106  101 - 111 mmol/L Final  . CO2 09/07/2016 27  22 - 32 mmol/L Final  . Glucose, Bld 09/07/2016 279* 65 - 99 mg/dL Final  . BUN 09/07/2016 13  6 - 20 mg/dL Final  . Creatinine, Ser 09/07/2016 1.28* 0.44 - 1.00  mg/dL Final  . Calcium 09/07/2016 9.2  8.9 - 10.3 mg/dL Final  . GFR calc non Af Amer 09/07/2016 40* >60 mL/min Final  . GFR calc Af Amer 09/07/2016 46* >60 mL/min Final   Comment: (NOTE) The eGFR has been calculated using the CKD EPI equation. This calculation has not been validated in all clinical situations. eGFR's persistently <60 mL/min signify possible Chronic Kidney Disease.   . Anion gap 09/07/2016 8  5 - 15 Final  . WBC 09/07/2016 9.4  4.0 - 10.5 K/uL Final  . RBC 09/07/2016 3.50* 3.87 - 5.11 MIL/uL Final  . Hemoglobin 09/07/2016 9.7* 12.0 - 15.0 g/dL Final  . HCT 09/07/2016 29.8* 36.0 - 46.0 % Final  . MCV 09/07/2016 85.1  78.0 - 100.0 fL Final  . MCH 09/07/2016 27.7  26.0 - 34.0 pg Final  . MCHC 09/07/2016 32.6  30.0 - 36.0 g/dL Final  . RDW 09/07/2016 16.5* 11.5 - 15.5 % Final  . Platelets 09/07/2016 293  150 - 400 K/uL Final  . Neutrophils Relative % 09/07/2016 84  % Final  . Neutro Abs 09/07/2016 7.8* 1.7 - 7.7 K/uL Final  . Lymphocytes Relative 09/07/2016 10  % Final  . Lymphs Abs 09/07/2016 1.0  0.7 - 4.0 K/uL Final  . Monocytes Relative 09/07/2016 5  % Final  . Monocytes Absolute 09/07/2016 0.5  0.1 - 1.0 K/uL Final  . Eosinophils Relative 09/07/2016 1  %  Final  . Eosinophils Absolute 09/07/2016 0.1  0.0 - 0.7 K/uL Final  . Basophils Relative 09/07/2016 0  % Final  . Basophils Absolute 09/07/2016 0.0  0.0 - 0.1 K/uL Final  . Troponin I 09/07/2016 0.03* <0.03 ng/mL Final   Comment: CRITICAL RESULT CALLED TO, READ BACK BY AND VERIFIED WITH: DOOLEY,H RN @ 1225 09/07/16 LEONARD,A   . Prothrombin Time 09/07/2016 15.0  11.4 - 15.2 seconds Final  . INR 09/07/2016 1.17   Final  . Troponin I 09/07/2016 0.03* <0.03 ng/mL Final   CRITICAL VALUE NOTED.  VALUE IS CONSISTENT WITH PREVIOUSLY REPORTED AND CALLED VALUE.  Marland Kitchen Specimen Description 09/07/2016 BLOOD LEFT ANTECUBITAL   Final  . Special Requests 09/07/2016 BOTTLES DRAWN AEROBIC AND ANAEROBIC Blood Culture adequate  volume   Final  . Culture  Setup Time 09/07/2016    Final                   Value:GRAM POSITIVE COCCI IN CLUSTERS IN BOTH AEROBIC AND ANAEROBIC BOTTLES CRITICAL RESULT CALLED TO, READ BACK BY AND VERIFIED WITH: H BAIRD 09/08/16 @ 1515 M VESTAL   . Culture 09/07/2016 *  Final                   Value:STAPHYLOCOCCUS SPECIES (COAGULASE NEGATIVE) THE SIGNIFICANCE OF ISOLATING THIS ORGANISM FROM A SINGLE SET OF BLOOD CULTURES WHEN MULTIPLE SETS ARE DRAWN IS UNCERTAIN. PLEASE NOTIFY THE MICROBIOLOGY DEPARTMENT WITHIN ONE WEEK IF SPECIATION AND SENSITIVITIES ARE REQUIRED.   Marland Kitchen Report Status 09/07/2016 09/10/2016 FINAL   Final  . Specimen Description 09/07/2016 BLOOD RIGHT HAND   Final  . Special Requests 09/07/2016 IN PEDIATRIC BOTTLE Blood Culture adequate volume   Final  . Culture 09/07/2016 NO GROWTH 5 DAYS   Final  . Report Status 09/07/2016 09/12/2016 FINAL   Final  . Troponin I 09/07/2016 0.03* <0.03 ng/mL Final   CRITICAL VALUE NOTED.  VALUE IS CONSISTENT WITH PREVIOUSLY REPORTED AND CALLED VALUE.  . Troponin I 09/08/2016 0.03* <0.03 ng/mL Final   CRITICAL VALUE NOTED.  VALUE IS CONSISTENT WITH PREVIOUSLY REPORTED AND CALLED VALUE.  Marland Kitchen Weight 09/08/2016 2036.8  oz Final  . Height 09/08/2016 60  in Final  . BP 09/08/2016 146/88  mmHg Final  . WBC 09/08/2016 10.5  4.0 - 10.5 K/uL Final  . RBC 09/08/2016 3.68* 3.87 - 5.11 MIL/uL Final  . Hemoglobin 09/08/2016 10.0* 12.0 - 15.0 g/dL Final  . HCT 09/08/2016 31.2* 36.0 - 46.0 % Final  . MCV 09/08/2016 84.8  78.0 - 100.0 fL Final  . MCH 09/08/2016 27.2  26.0 - 34.0 pg Final  . MCHC 09/08/2016 32.1  30.0 - 36.0 g/dL Final  . RDW 09/08/2016 16.3* 11.5 - 15.5 % Final  . Platelets 09/08/2016 268  150 - 400 K/uL Final  . Sodium 09/08/2016 142  135 - 145 mmol/L Final  . Potassium 09/08/2016 4.2  3.5 - 5.1 mmol/L Final  . Chloride 09/08/2016 107  101 - 111 mmol/L Final  . CO2 09/08/2016 24  22 - 32 mmol/L Final  . Glucose, Bld 09/08/2016 142* 65 -  99 mg/dL Final  . BUN 09/08/2016 14  6 - 20 mg/dL Final  . Creatinine, Ser 09/08/2016 1.20* 0.44 - 1.00 mg/dL Final  . Calcium 09/08/2016 9.4  8.9 - 10.3 mg/dL Final  . Total Protein 09/08/2016 6.5  6.5 - 8.1 g/dL Final  . Albumin 09/08/2016 3.0* 3.5 - 5.0 g/dL Final  . AST 09/08/2016 16  15 -  41 U/L Final  . ALT 09/08/2016 9* 14 - 54 U/L Final  . Alkaline Phosphatase 09/08/2016 66  38 - 126 U/L Final  . Total Bilirubin 09/08/2016 0.9  0.3 - 1.2 mg/dL Final  . GFR calc non Af Amer 09/08/2016 43* >60 mL/min Final  . GFR calc Af Amer 09/08/2016 50* >60 mL/min Final   Comment: (NOTE) The eGFR has been calculated using the CKD EPI equation. This calculation has not been validated in all clinical situations. eGFR's persistently <60 mL/min signify possible Chronic Kidney Disease.   . Anion gap 09/08/2016 11  5 - 15 Final  . Troponin I 09/08/2016 0.03* <0.03 ng/mL Final   CRITICAL VALUE NOTED.  VALUE IS CONSISTENT WITH PREVIOUSLY REPORTED AND CALLED VALUE.  Marland Kitchen Glucose-Capillary 09/07/2016 138* 65 - 99 mg/dL Final  . Glucose-Capillary 09/08/2016 136* 65 - 99 mg/dL Final  . Glucose-Capillary 09/08/2016 232* 65 - 99 mg/dL Final  . Enterococcus species 09/07/2016 NOT DETECTED  NOT DETECTED Final  . Vancomycin resistance 09/07/2016 NOT DETECTED  NOT DETECTED Final  . Listeria monocytogenes 09/07/2016 NOT DETECTED  NOT DETECTED Final  . Staphylococcus species 09/07/2016 DETECTED* NOT DETECTED Final   Comment: CRITICAL RESULT CALLED TO, READ BACK BY AND VERIFIED WITH: H BAIRD 09/08/16 @ 1515 M VESTAL   . Staphylococcus aureus 09/07/2016 NOT DETECTED  NOT DETECTED Final  . Methicillin resistance 09/07/2016 NOT DETECTED  NOT DETECTED Final  . Streptococcus species 09/07/2016 NOT DETECTED  NOT DETECTED Final  . Streptococcus agalactiae 09/07/2016 NOT DETECTED  NOT DETECTED Final  . Streptococcus pneumoniae 09/07/2016 NOT DETECTED  NOT DETECTED Final  . Streptococcus pyogenes 09/07/2016 NOT DETECTED   NOT DETECTED Final  . Acinetobacter baumannii 09/07/2016 NOT DETECTED  NOT DETECTED Final  . Enterobacteriaceae species 09/07/2016 NOT DETECTED  NOT DETECTED Final  . Enterobacter cloacae complex 09/07/2016 NOT DETECTED  NOT DETECTED Final  . Escherichia coli 09/07/2016 NOT DETECTED  NOT DETECTED Final  . Klebsiella oxytoca 09/07/2016 NOT DETECTED  NOT DETECTED Final  . Klebsiella pneumoniae 09/07/2016 NOT DETECTED  NOT DETECTED Final  . Proteus species 09/07/2016 NOT DETECTED  NOT DETECTED Final  . Serratia marcescens 09/07/2016 NOT DETECTED  NOT DETECTED Final  . Carbapenem resistance 09/07/2016 NOT DETECTED  NOT DETECTED Final  . Haemophilus influenzae 09/07/2016 NOT DETECTED  NOT DETECTED Final  . Neisseria meningitidis 09/07/2016 NOT DETECTED  NOT DETECTED Final  . Pseudomonas aeruginosa 09/07/2016 NOT DETECTED  NOT DETECTED Final  . Candida albicans 09/07/2016 NOT DETECTED  NOT DETECTED Final  . Candida glabrata 09/07/2016 NOT DETECTED  NOT DETECTED Final  . Candida krusei 09/07/2016 NOT DETECTED  NOT DETECTED Final  . Candida parapsilosis 09/07/2016 NOT DETECTED  NOT DETECTED Final  . Candida tropicalis 09/07/2016 NOT DETECTED  NOT DETECTED Final  . Glucose-Capillary 09/08/2016 128* 65 - 99 mg/dL Final  . Sodium 09/09/2016 141  135 - 145 mmol/L Final  . Potassium 09/09/2016 3.9  3.5 - 5.1 mmol/L Final  . Chloride 09/09/2016 107  101 - 111 mmol/L Final  . CO2 09/09/2016 27  22 - 32 mmol/L Final  . Glucose, Bld 09/09/2016 181* 65 - 99 mg/dL Final  . BUN 09/09/2016 23* 6 - 20 mg/dL Final  . Creatinine, Ser 09/09/2016 1.50* 0.44 - 1.00 mg/dL Final  . Calcium 09/09/2016 9.2  8.9 - 10.3 mg/dL Final  . GFR calc non Af Amer 09/09/2016 33* >60 mL/min Final  . GFR calc Af Amer 09/09/2016 38* >60 mL/min Final   Comment: (  NOTE) The eGFR has been calculated using the CKD EPI equation. This calculation has not been validated in all clinical situations. eGFR's persistently <60 mL/min signify  possible Chronic Kidney Disease.   . Anion gap 09/09/2016 7  5 - 15 Final  . Glucose-Capillary 09/08/2016 154* 65 - 99 mg/dL Final  . Glucose-Capillary 09/09/2016 152* 65 - 99 mg/dL Final  . Glucose-Capillary 09/09/2016 164* 65 - 99 mg/dL Final  . Glucose-Capillary 09/09/2016 180* 65 - 99 mg/dL Final  Nursing Home on 09/05/2016  Component Date Value Ref Range Status  . Hemoglobin 09/04/2016 10.5* 12.0 - 16.0 Final  . HCT 09/04/2016 32* 36 - 46 Final  . Neutrophils Absolute 09/04/2016 8   Final  . Platelets 09/04/2016 220  150 - 399 Final  . WBC 09/04/2016 10.2   Final  . Glucose 09/04/2016 236   Final  . BUN 09/04/2016 14  4 - 21 Final  . Creatinine 09/04/2016 1.1  0.5 - 1.1 Final  . Potassium 09/04/2016 3.7  3.4 - 5.3 Final  . Sodium 09/04/2016 139  137 - 147 Final  . Alkaline Phosphatase 09/04/2016 76  25 - 125 Final  . ALT 09/04/2016 6* 7 - 35 Final  . AST 09/04/2016 9* 13 - 35 Final  . Bilirubin, Total 09/04/2016 0.6   Final  Nursing Home on 09/03/2016  Component Date Value Ref Range Status  . Glucose 08/07/2016 98   Final  . BUN 08/07/2016 28* 4 - 21 Final  . Creatinine 08/07/2016 1.4* 0.5 - 1.1 Final  . Potassium 08/07/2016 4.2  3.4 - 5.3 Final  . Sodium 08/07/2016 142  137 - 147 Final  . Triglycerides 08/07/2016 145  40 - 160 Final  . Cholesterol 08/07/2016 141  0 - 200 Final  . HDL 08/07/2016 53  35 - 70 Final  . LDL Cholesterol 08/07/2016 59   Final  . Alkaline Phosphatase 08/07/2016 94  25 - 125 Final  . ALT 08/07/2016 10  7 - 35 Final  . AST 08/07/2016 14  13 - 35 Final  . Bilirubin, Total 08/07/2016 0.3   Final  Admission on 08/29/2016, Discharged on 09/02/2016  Component Date Value Ref Range Status  . WBC 08/29/2016 8.3  4.0 - 10.5 K/uL Final  . RBC 08/29/2016 4.87  3.87 - 5.11 MIL/uL Final  . Hemoglobin 08/29/2016 13.0  12.0 - 15.0 g/dL Final  . HCT 08/29/2016 38.7  36.0 - 46.0 % Final  . MCV 08/29/2016 79.5  78.0 - 100.0 fL Final  . MCH 08/29/2016 26.7   26.0 - 34.0 pg Final  . MCHC 08/29/2016 33.6  30.0 - 36.0 g/dL Final  . RDW 08/29/2016 15.8* 11.5 - 15.5 % Final  . Platelets 08/29/2016 179  150 - 400 K/uL Final  . Neutrophils Relative % 08/29/2016 66  % Final  . Neutro Abs 08/29/2016 5.6  1.7 - 7.7 K/uL Final  . Lymphocytes Relative 08/29/2016 20  % Final  . Lymphs Abs 08/29/2016 1.7  0.7 - 4.0 K/uL Final  . Monocytes Relative 08/29/2016 10  % Final  . Monocytes Absolute 08/29/2016 0.8  0.1 - 1.0 K/uL Final  . Eosinophils Relative 08/29/2016 3  % Final  . Eosinophils Absolute 08/29/2016 0.2  0.0 - 0.7 K/uL Final  . Basophils Relative 08/29/2016 1  % Final  . Basophils Absolute 08/29/2016 0.0  0.0 - 0.1 K/uL Final  . Sodium 08/29/2016 140  135 - 145 mmol/L Final  . Potassium 08/29/2016 4.2  3.5 -  5.1 mmol/L Final  . Chloride 08/29/2016 105  101 - 111 mmol/L Final  . CO2 08/29/2016 26  22 - 32 mmol/L Final  . Glucose, Bld 08/29/2016 132* 65 - 99 mg/dL Final  . BUN 08/29/2016 28* 6 - 20 mg/dL Final  . Creatinine, Ser 08/29/2016 1.20* 0.44 - 1.00 mg/dL Final  . Calcium 08/29/2016 9.8  8.9 - 10.3 mg/dL Final  . Total Protein 08/29/2016 7.4  6.5 - 8.1 g/dL Final  . Albumin 08/29/2016 3.6  3.5 - 5.0 g/dL Final  . AST 08/29/2016 18  15 - 41 U/L Final  . ALT 08/29/2016 15  14 - 54 U/L Final  . Alkaline Phosphatase 08/29/2016 90  38 - 126 U/L Final  . Total Bilirubin 08/29/2016 0.7  0.3 - 1.2 mg/dL Final  . GFR calc non Af Amer 08/29/2016 43* >60 mL/min Final  . GFR calc Af Amer 08/29/2016 50* >60 mL/min Final   Comment: (NOTE) The eGFR has been calculated using the CKD EPI equation. This calculation has not been validated in all clinical situations. eGFR's persistently <60 mL/min signify possible Chronic Kidney Disease.   . Anion gap 08/29/2016 9  5 - 15 Final  . Color, Urine 08/29/2016 YELLOW  YELLOW Final  . APPearance 08/29/2016 CLEAR  CLEAR Final  . Specific Gravity, Urine 08/29/2016 1.012  1.005 - 1.030 Final  . pH 08/29/2016  7.0  5.0 - 8.0 Final  . Glucose, UA 08/29/2016 NEGATIVE  NEGATIVE mg/dL Final  . Hgb urine dipstick 08/29/2016 SMALL* NEGATIVE Final  . Bilirubin Urine 08/29/2016 NEGATIVE  NEGATIVE Final  . Ketones, ur 08/29/2016 NEGATIVE  NEGATIVE mg/dL Final  . Protein, ur 08/29/2016 100* NEGATIVE mg/dL Final  . Nitrite 08/29/2016 NEGATIVE  NEGATIVE Final  . Leukocytes, UA 08/29/2016 TRACE* NEGATIVE Final  . RBC / HPF 08/29/2016 0-5  0 - 5 RBC/hpf Final  . WBC, UA 08/29/2016 0-5  0 - 5 WBC/hpf Final  . Bacteria, UA 08/29/2016 MANY* NONE SEEN Final  . Squamous Epithelial / LPF 08/29/2016 0-5* NONE SEEN Final  . Mucus 08/29/2016 PRESENT   Final  . Specimen Description 08/29/2016 URINE, CLEAN CATCH   Final  . Special Requests 08/29/2016 NONE   Final  . Culture 08/29/2016 MULTIPLE SPECIES PRESENT, SUGGEST RECOLLECTION*  Final  . Report Status 08/29/2016 08/31/2016 FINAL   Final  . Prothrombin Time 08/30/2016 13.3  11.4 - 15.2 seconds Final  . INR 08/30/2016 1.01   Final  . MRSA by PCR 08/30/2016 NEGATIVE  NEGATIVE Final   Comment:        The GeneXpert MRSA Assay (FDA approved for NASAL specimens only), is one component of a comprehensive MRSA colonization surveillance program. It is not intended to diagnose MRSA infection nor to guide or monitor treatment for MRSA infections.   . ABO/RH(D) 08/30/2016 B POS   Final  . Antibody Screen 08/30/2016 NEG   Final  . Sample Expiration 08/30/2016 09/02/2016   Final  . Unit Number 08/30/2016 [id number]   Final  . Blood Component Type 08/30/2016 RED CELLS,LR   Final  . Unit division 08/30/2016 00   Final  . Status of Unit 08/30/2016 ISSUED,FINAL   Final  . Transfusion Status 08/30/2016 OK TO TRANSFUSE   Final  . Crossmatch Result 08/30/2016 Compatible   Final  . Unit Number 08/30/2016 [id number]   Final  . Blood Component Type 08/30/2016 RBC LR PHER2   Final  . Unit division 08/30/2016 00   Final  . Status of  Unit 08/30/2016 ISSUED,FINAL   Final   . Transfusion Status 08/30/2016 OK TO TRANSFUSE   Final  . Crossmatch Result 08/30/2016 Compatible   Final  . Specimen Description 08/30/2016 TISSUE LEFT FEMORAL ARTERY   Final  . Special Requests 08/30/2016 GRAFT TISSUE POF VANC   Final  . Gram Stain 08/30/2016    Final                   Value:RARE WBC PRESENT, PREDOMINANTLY PMN NO ORGANISMS SEEN   . Culture 08/30/2016 No growth aerobically or anaerobically.   Final  . Report Status 08/30/2016 09/05/2016 FINAL   Final  . Glucose-Capillary 08/30/2016 191* 65 - 99 mg/dL Final  . Sodium 08/31/2016 138  135 - 145 mmol/L Final  . Potassium 08/31/2016 4.6  3.5 - 5.1 mmol/L Final  . Chloride 08/31/2016 108  101 - 111 mmol/L Final  . CO2 08/31/2016 21* 22 - 32 mmol/L Final  . Glucose, Bld 08/31/2016 192* 65 - 99 mg/dL Final  . BUN 08/31/2016 27* 6 - 20 mg/dL Final  . Creatinine, Ser 08/31/2016 1.59* 0.44 - 1.00 mg/dL Final  . Calcium 08/31/2016 8.6* 8.9 - 10.3 mg/dL Final  . GFR calc non Af Amer 08/31/2016 30* >60 mL/min Final  . GFR calc Af Amer 08/31/2016 35* >60 mL/min Final   Comment: (NOTE) The eGFR has been calculated using the CKD EPI equation. This calculation has not been validated in all clinical situations. eGFR's persistently <60 mL/min signify possible Chronic Kidney Disease.   . Anion gap 08/31/2016 9  5 - 15 Final  . Hgb A1c MFr Bld 08/30/2016 6.6* 4.8 - 5.6 % Final   Comment: (NOTE)         Pre-diabetes: 5.7 - 6.4         Diabetes: >6.4         Glycemic control for adults with diabetes: <7.0   . Mean Plasma Glucose 08/30/2016 143  mg/dL Final   Comment: (NOTE) Performed At: [facility] [place], [place] [id number] Lindon Romp MD JS:[id number]   . Glucose-Capillary 08/30/2016 223* 65 - 99 mg/dL Final  . Comment 1 08/30/2016 Capillary Specimen   Final  . Comment 2 08/30/2016 Notify RN   Final  . WBC 08/31/2016 12.2* 4.0 - 10.5 K/uL Final  . RBC 08/31/2016 2.78* 3.87 - 5.11 MIL/uL Final   . Hemoglobin 08/31/2016 7.3* 12.0 - 15.0 g/dL Final  . HCT 08/31/2016 22.3* 36.0 - 46.0 % Final  . MCV 08/31/2016 80.2  78.0 - 100.0 fL Final  . MCH 08/31/2016 26.3  26.0 - 34.0 pg Final  . MCHC 08/31/2016 32.7  30.0 - 36.0 g/dL Final  . RDW 08/31/2016 16.2* 11.5 - 15.5 % Final  . Platelets 08/31/2016 137* 150 - 400 K/uL Final  . Glucose-Capillary 08/31/2016 200* 65 - 99 mg/dL Final  . Comment 1 08/31/2016 Notify RN   Final  . Order Confirmation 08/31/2016 ORDER PROCESSED BY BLOOD BANK   Final  . ISSUE DATE / TIME 08/30/2016 [id number]   Final  . Blood Product Unit Number 08/30/2016 [id number]   Final  . PRODUCT CODE 08/30/2016 [id number]   Final  . Unit Type and Rh 08/30/2016 7300   Final  . Blood Product Expiration Date 08/30/2016 [id number]   Final  . ISSUE DATE / TIME 08/30/2016 [id number]   Final  . Blood Product Unit Number 08/30/2016 [id number]   Final  . PRODUCT CODE 08/30/2016 [id number]   Final  .  Unit Type and Rh 08/30/2016 7300   Final  . Blood Product Expiration Date 08/30/2016 [id number]   Final  . Glucose-Capillary 08/31/2016 132* 65 - 99 mg/dL Final  . Comment 1 08/31/2016 Notify RN   Final  . Glucose-Capillary 08/31/2016 61* 65 - 99 mg/dL Final  . Comment 1 08/31/2016 Notify RN   Final  . Sodium 09/01/2016 137  135 - 145 mmol/L Final  . Potassium 09/01/2016 4.0  3.5 - 5.1 mmol/L Final  . Chloride 09/01/2016 110  101 - 111 mmol/L Final  . CO2 09/01/2016 22  22 - 32 mmol/L Final  . Glucose, Bld 09/01/2016 134* 65 - 99 mg/dL Final  . BUN 09/01/2016 30* 6 - 20 mg/dL Final  . Creatinine, Ser 09/01/2016 1.44* 0.44 - 1.00 mg/dL Final  . Calcium 09/01/2016 8.8* 8.9 - 10.3 mg/dL Final  . GFR calc non Af Amer 09/01/2016 34* >60 mL/min Final  . GFR calc Af Amer 09/01/2016 40* >60 mL/min Final   Comment: (NOTE) The eGFR has been calculated using the CKD EPI equation. This calculation has not been validated in all clinical situations. eGFR's persistently <60  mL/min signify possible Chronic Kidney Disease.   . Anion gap 09/01/2016 5  5 - 15 Final  . WBC 09/01/2016 12.0* 4.0 - 10.5 K/uL Final  . RBC 09/01/2016 3.10* 3.87 - 5.11 MIL/uL Final  . Hemoglobin 09/01/2016 8.7* 12.0 - 15.0 g/dL Final  . HCT 09/01/2016 25.9* 36.0 - 46.0 % Final  . MCV 09/01/2016 83.5  78.0 - 100.0 fL Final  . MCH 09/01/2016 28.1  26.0 - 34.0 pg Final  . MCHC 09/01/2016 33.6  30.0 - 36.0 g/dL Final  . RDW 09/01/2016 16.3* 11.5 - 15.5 % Final  . Platelets 09/01/2016 115* 150 - 400 K/uL Final   PLATELET COUNT CONFIRMED BY SMEAR  . Hemoglobin 08/31/2016 9.6* 12.0 - 15.0 g/dL Final   POST TRANSFUSION SPECIMEN  . HCT 08/31/2016 29.4* 36.0 - 46.0 % Final  . Glucose-Capillary 08/31/2016 90  65 - 99 mg/dL Final  . Comment 1 08/31/2016 Notify RN   Final  . Glucose-Capillary 08/31/2016 232* 65 - 99 mg/dL Final  . Glucose-Capillary 09/01/2016 178* 65 - 99 mg/dL Final  . Glucose-Capillary 09/01/2016 134* 65 - 99 mg/dL Final  . Glucose-Capillary 09/01/2016 145* 65 - 99 mg/dL Final  . Sodium 09/02/2016 135  135 - 145 mmol/L Final  . Potassium 09/02/2016 3.8  3.5 - 5.1 mmol/L Final  . Chloride 09/02/2016 106  101 - 111 mmol/L Final  . CO2 09/02/2016 23  22 - 32 mmol/L Final  . Glucose, Bld 09/02/2016 151* 65 - 99 mg/dL Final  . BUN 09/02/2016 27* 6 - 20 mg/dL Final  . Creatinine, Ser 09/02/2016 1.28* 0.44 - 1.00 mg/dL Final  . Calcium 09/02/2016 9.0  8.9 - 10.3 mg/dL Final  . GFR calc non Af Amer 09/02/2016 40* >60 mL/min Final  . GFR calc Af Amer 09/02/2016 46* >60 mL/min Final   Comment: (NOTE) The eGFR has been calculated using the CKD EPI equation. This calculation has not been validated in all clinical situations. eGFR's persistently <60 mL/min signify possible Chronic Kidney Disease.   . Anion gap 09/02/2016 6  5 - 15 Final  . Glucose-Capillary 09/01/2016 177* 65 - 99 mg/dL Final  . Glucose-Capillary 09/02/2016 159* 65 - 99 mg/dL Final  . Glucose-Capillary  09/02/2016 137* 65 - 99 mg/dL Final  . Glucose-Capillary 09/02/2016 128* 65 - 99 mg/dL Final  Nursing Home  on 08/06/2016  Component Date Value Ref Range Status  . Glucose 02/03/2016 101   Final  . BUN 02/03/2016 29* 4 - 21 Final  . Creatinine 02/03/2016 1.1  0.5 - 1.1 Final  . Potassium 02/03/2016 4.5  3.4 - 5.3 Final  . Sodium 02/03/2016 142  137 - 147 Final  . Alkaline Phosphatase 02/03/2016 76  25 - 125 Final  . ALT 02/03/2016 20  7 - 35 Final  . AST 02/03/2016 27  13 - 35 Final  . Bilirubin, Total 02/03/2016 0.3   Final  . TSH 06/26/2016 4.68  0.41 - 5.90 Final     Assessment/Plan   ICD-10-CM   1. Wheezing R06.2   2. Chronic systolic congestive heart failure (HCC) I50.22   3. Paroxysmal atrial fibrillation (HCC) I48.0   4. Type 2 diabetes mellitus with diabetic neuropathy, without long-term current use of insulin (HCC) E11.40   5. Vascular dementia without behavioral disturbance F01.50   6. 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 Essential hypertension I10   8. Anemia in stage 3 chronic kidney disease (HCC) N18.3    D63.1   9. CKD stage 3 due to type 2 diabetes mellitus (HCC) E11.22    N18.3      Check CXR  Cont current meds as ordered. Finish doxy  Wound care as ordered. She has a wound vac  PT/OT/ST as ordered  F/u with specialists as scheduled  Cont [place] O2 _0 /min  GOAL: short term rehab then continue long term care. Communicated with pt and nursing.  Will follow  Markeise Mathews S. Perlie Gold  [facility] [address] [place], [place] [address] [phone] Cell (Monday-Friday 8 AM - 5 PM) [phone] After 5 PM and follow prompts

## 2016-10-29 ENCOUNTER — Other Ambulatory Visit (HOSPITAL_COMMUNITY): Payer: Medicare Other

## 2016-10-29 LAB — URINALYSIS, ROUTINE W REFLEX MICROSCOPIC
Bilirubin Urine: NEGATIVE
Glucose, UA: NEGATIVE mg/dL
Hgb urine dipstick: NEGATIVE
Ketones, ur: NEGATIVE mg/dL
NITRITE: NEGATIVE
PH: 5 (ref 5.0–8.0)
Protein, ur: NEGATIVE mg/dL
SPECIFIC GRAVITY, URINE: 1.013 (ref 1.005–1.030)

## 2016-10-29 LAB — LACTIC ACID, PLASMA: LACTIC ACID, VENOUS: 1.8 mmol/L (ref 0.5–1.9)

## 2016-10-30 LAB — CBC
HEMATOCRIT: 34.6 % — AB (ref 36.0–46.0)
HEMOGLOBIN: 10.1 g/dL — AB (ref 12.0–15.0)
MCH: 25.3 pg — ABNORMAL LOW (ref 26.0–34.0)
MCHC: 29.2 g/dL — ABNORMAL LOW (ref 30.0–36.0)
MCV: 86.5 fL (ref 78.0–100.0)
PLATELETS: 300 10*3/uL (ref 150–400)
RBC: 4 MIL/uL (ref 3.87–5.11)
RDW: 17.7 % — ABNORMAL HIGH (ref 11.5–15.5)
WBC: 13.4 10*3/uL — ABNORMAL HIGH (ref 4.0–10.5)

## 2016-10-30 LAB — URINE CULTURE

## 2016-10-30 LAB — RENAL FUNCTION PANEL
ANION GAP: 12 (ref 5–15)
Albumin: 2.8 g/dL — ABNORMAL LOW (ref 3.5–5.0)
BUN: 88 mg/dL — ABNORMAL HIGH (ref 6–20)
CHLORIDE: 97 mmol/L — AB (ref 101–111)
CO2: 30 mmol/L (ref 22–32)
CREATININE: 1.95 mg/dL — AB (ref 0.44–1.00)
Calcium: 10.1 mg/dL (ref 8.9–10.3)
GFR, EST AFRICAN AMERICAN: 28 mL/min — AB (ref >60)
GFR, EST NON AFRICAN AMERICAN: 24 mL/min — AB (ref >60)
Glucose, Bld: 213 mg/dL — ABNORMAL HIGH (ref 65–99)
POTASSIUM: 3.8 mmol/L (ref 3.5–5.1)
Phosphorus: 3.6 mg/dL (ref 2.5–4.6)
Sodium: 139 mmol/L (ref 135–145)

## 2016-10-30 LAB — MAGNESIUM: MAGNESIUM: 2.3 mg/dL (ref 1.7–2.4)

## 2016-10-31 LAB — CULTURE, RESPIRATORY

## 2016-10-31 LAB — CULTURE, RESPIRATORY W GRAM STAIN: Culture: NORMAL

## 2016-11-02 ENCOUNTER — Encounter: Payer: Self-pay | Admitting: Adult Health

## 2016-11-02 ENCOUNTER — Non-Acute Institutional Stay (SKILLED_NURSING_FACILITY): Payer: Medicare Other | Admitting: Adult Health

## 2016-11-02 ENCOUNTER — Telehealth: Payer: Self-pay

## 2016-11-02 DIAGNOSIS — I5023 Acute on chronic systolic (congestive) heart failure: Secondary | ICD-10-CM

## 2016-11-02 DIAGNOSIS — I110 Hypertensive heart disease with heart failure: Secondary | ICD-10-CM | POA: Diagnosis not present

## 2016-11-02 DIAGNOSIS — D500 Iron deficiency anemia secondary to blood loss (chronic): Secondary | ICD-10-CM | POA: Diagnosis not present

## 2016-11-02 DIAGNOSIS — I4891 Unspecified atrial fibrillation: Secondary | ICD-10-CM

## 2016-11-02 DIAGNOSIS — E1140 Type 2 diabetes mellitus with diabetic neuropathy, unspecified: Secondary | ICD-10-CM

## 2016-11-02 DIAGNOSIS — G40909 Epilepsy, unspecified, not intractable, without status epilepticus: Secondary | ICD-10-CM

## 2016-11-02 DIAGNOSIS — E785 Hyperlipidemia, unspecified: Secondary | ICD-10-CM

## 2016-11-02 DIAGNOSIS — I5022 Chronic systolic (congestive) heart failure: Secondary | ICD-10-CM

## 2016-11-02 DIAGNOSIS — F0150 Vascular dementia without behavioral disturbance: Secondary | ICD-10-CM | POA: Diagnosis not present

## 2016-11-02 DIAGNOSIS — J189 Pneumonia, unspecified organism: Secondary | ICD-10-CM | POA: Diagnosis not present

## 2016-11-02 DIAGNOSIS — E1169 Type 2 diabetes mellitus with other specified complication: Secondary | ICD-10-CM | POA: Diagnosis not present

## 2016-11-02 LAB — CULTURE, BLOOD (ROUTINE X 2)
CULTURE: NO GROWTH
Culture: NO GROWTH
SPECIAL REQUESTS: ADEQUATE
SPECIAL REQUESTS: ADEQUATE

## 2016-11-02 NOTE — Progress Notes (Signed)
Location:   [facility] Room Number: 106 A Place of Service:  SNF (31)   CODE STATUS: Full Code  Allergies  Allergen Reactions  . Penicillins Other (See Comments)    Told by a doctor to "not take" and on MAR as an allergy Has patient had a PCN reaction causing immediate rash, facial/tongue/throat swelling, SOB or lightheadedness with hypotension: Unknown Has patient had a PCN reaction causing severe rash involving mucus membranes or skin necrosis: Unknown Has patient had a PCN reaction that required hospitalization: Unknown Has patient had a PCN reaction occurring within the last 10 years: Unknown If all of the above answers are "NO", then may proceed with Ceph    Chief Complaint  Patient presents with  . Hospitalization Follow-up    Hospital follow up    HPI:  She is a 76 year old long term resident of this facility who has had a prolonged hospitalized for atrial fib with rvr; acute on chronic systolic heart failure; acute hypoxic respiratory failure due to pneumonia and acute blood loss anemia. She had been intubated 8/17 through 09/19/16. She is unable to participate in the hpi or ros. Her heart rate is elevated at 140. I have discussed this with Dr. Eulas Post; will increase her cardizem. She may need to be hospitalized once again. She is presently being treated for pneumonia with levaquin.    Past Medical History:  Diagnosis Date  . Abdominal or pelvic swelling, mass, or lump, left upper quadrant   . CHF (congestive heart failure) ([place])   . Chronic atrial fibrillation ([place])   . Chronic kidney disease   . Congestive heart failure, unspecified   . Coronary atherosclerosis of unspecified type of vessel, native or graft   . Diabetes mellitus   . Edema   . GERD (gastroesophageal reflux disease)   . Gout, unspecified   . Hypercholesterolemia   . Hypertension   . 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  . Peripheral vascular disease, unspecified ([place])   . Seizures ([place])   . Sinus of Valsalva aneurysm    a. By 2D echo 05/2011.  . Stroke ([place])   . Unspecified hereditary and idiopathic peripheral neuropathy   . Unspecified urinary incontinence   . Vascular dementia, uncomplicated     Past Surgical History:  Procedure Laterality Date  . AORTOGRAM  09/01/2008   Abd w/ bilateral lower extremity runoff arteriography  . APPLICATION OF WOUND VAC Left 08/30/2016   Procedure: APPLICATION OF WOUND VAC LEFT GROIN;  Surgeon: Serafina Mitchell, MD;  Location: [facility];  Service: Vascular;  Laterality: Left;  . ENDARTERECTOMY FEMORAL Left 08/30/2016   Procedure: LEFT COMMON FEMORAL ARTERY ENDARTERECTOMY;  Surgeon: Serafina Mitchell, MD;  Location: [facility];  Service: Vascular;  Laterality: Left;  . FALSE ANEURYSM REPAIR  12/06/2011   Procedure: REPAIR FALSE ANEURYSM;  Surgeon: Angelia Mould, MD;  Location: [facility] OR;  Service: Vascular;  Laterality: Left;  Repair of left femoral Artery pseudoaneurysm  . FALSE ANEURYSM REPAIR Left 08/30/2016   Procedure: REPAIR PSEUDOANEURYSM LEFT GROIN;  Surgeon: Serafina Mitchell, MD;  Location: [facility];  Service: Vascular;  Laterality: Left;  . FEMORAL ARTERY EXPLORATION  12/06/2011   Procedure: FEMORAL ARTERY EXPLORATION;  Surgeon: Angelia Mould, MD;  Location: [facility] OR;  Service: Vascular;  Laterality: N/A;  Exploration of large  pseudoaneurysm left side of fem-fem bypass graft   . femoral to femoral bypass graft     . FEMORAL-FEMORAL BYPASS GRAFT  12/06/2011   Procedure: BYPASS GRAFT FEMORAL-FEMORAL ARTERY;  Surgeon: Angelia Mould, MD;  Location[facility];  Service: Vascular;  Laterality: Bilateral;  Revision of left to right Femoral-Femoral bypass graft  . FEMORAL-FEMORAL BYPASS GRAFT Left 08/30/2016   Procedure: REDO RIGHT TO LEFT FEMORAL-FEMORAL ARTERY BYPASS GRAFT;   Surgeon: Serafina Mitchell, MD;  Location: [facility];  Service: Vascular;  Laterality: Left;  . FEMORAL-POPLITEAL BYPASS GRAFT Left 05/2002   lower extremity femoral to below knee w/ non-versed greater saphenous vein  . FEMORAL-POPLITEAL BYPASS GRAFT Left 11/2002  . FEMORAL-POPLITEAL BYPASS GRAFT Left 08/30/2016   Procedure: REVISION OF LEFT FEMORAL-POPLITEAL ARTERY BYPASS GRAFT;  Surgeon: Serafina Mitchell, MD;  Location: [facility] OR;  Service: Vascular;  Laterality: Left;  . IR GASTROSTOMY TUBE MOD SED  10/18/2016  . THROMBECTOMY FEMORAL ARTERY Left 08/30/2016   Procedure: THROMBECTOMY OF FEMORAL-FEMORAL ARTERY BYPASS GRAFT AND LEFT FEMORAL TO LEFT POPLITEAL BYPASS GRAFT;  Surgeon: Serafina Mitchell, MD;  Location: [place];  Service: Vascular;  Laterality: Left;  . TUBAL LIGATION Bilateral     Social History   Social History  . Marital status: Widowed    Spouse name: N/A  . Number of children: N/A  . Years of education: N/A   Occupational History  . Not on file.   Social History Main Topics  . Smoking status: Former Smoker    Types: Cigarettes    Quit date: 01/29/2001  . Smokeless tobacco: Never Used  . Alcohol use No  . Drug use: No  . Sexual activity: No   Other Topics Concern  . Not on file   Social History Narrative  . No narrative on file   Family History  Problem Relation Age of Onset  . Cancer Mother        Cervical  . Stroke Mother   . Diabetes Mother   . Stroke Father   . Heart disease Father       VITAL SIGNS BP 118/80   Pulse 84   Temp 97.8 F (36.6 C)   Resp 20   Ht 5' (1.524 m)   Wt 125 lb 3 oz (56.8 kg)   SpO2 97%   BMI 24.45 kg/m    Apical pulse was 140 and irregular   Patient's Medications  New Prescriptions   No medications on file  Previous Medications   ALLOPURINOL (ZYLOPRIM) 100 MG TABLET    Take 100 mg by mouth daily.   AMANTADINE HCL 100 MG TABLET    Place 100 mg into feeding tube 2 (two) times daily.   AMINO ACIDS-PROTEIN HYDROLYS (FEEDING  SUPPLEMENT, PRO-STAT SUGAR FREE 64,) LIQD    Place 30 mLs into feeding tube 2 (two) times daily.   ATORVASTATIN (LIPITOR) 10 MG TABLET    Place 10 mg into feeding tube at bedtime.    DILTIAZEM (CARDIZEM) 30 MG TABLET    Give 1 tablet via G-tube every 6 hours   DONEPEZIL (ARICEPT) 10 MG TABLET    Place 10 mg into feeding tube at bedtime.   FERROUS SULFATE 325 (65 FE) MG TABLET    Give 1 tablet via G-Tube tow times daily   INSULIN GLARGINE (LANTUS) 100 UNIT/ML SOPN    Inject 7 Units into the skin at bedtime.   INSULIN LISPRO (HUMALOG) 100 UNIT/ML INJECTION    Inject 5 Units into  the skin every 6 (six) hours.   LEVETIRACETAM (KEPPRA) 250 MG TABLET    Give 1 tablet via G-Tube two times daily   LEVOFLOXACIN (LEVAQUIN) 750 MG/150ML SOLN    Inject 750 mg into the vein daily. Until 11/06/16   METOCLOPRAMIDE (REGLAN) 5 MG TABLET    Place 5 mg into feeding tube every 8 (eight) hours.   METOPROLOL TARTRATE (LOPRESSOR) 25 MG TABLET    Give 1 tablets (50mg ) via G-tube in the morning and 1 tablet by mouth at bedtime   MIRTAZAPINE (REMERON) 7.5 MG TABLET    Place 7.5 mg into feeding tube at bedtime.    MULTIPLE VITAMINS-MINERALS (THERA-M) TABS    1 tablet by PEG Tube route daily.   PANTOPRAZOLE (PROTONIX) 40 MG TABLET    Give 1 tablet via G-Tube tow times a day   ZINC SULFATE 220 (50 ZN) MG CAPSULE    Place 220 mg into feeding tube at bedtime.  Modified Medications   No medications on file  Discontinued Medications   ACETAMINOPHEN (TYLENOL) 325 MG TABLET    Take 2 tablets (650 mg total) by mouth every 6 (six) hours as needed for mild pain (or Fever >/= 101).   BISACODYL (BISCOLAX) 10 MG SUPPOSITORY    Place 10 mg rectally as needed for moderate constipation.   CETYLPYRIDINIUM CHLORIDE MT    Give 15 ml by mouth two times a day for oral irritation   DONEPEZIL (ARICEPT) 10 MG TABLET    Take 1 tablet (10 mg total) by mouth at bedtime.   IPRATROPIUM-ALBUTEROL (DUONEB) 0.5-2.5 (3) MG/3ML SOLN    Take 3 mLs by  nebulization every 4 (four) hours as needed.   OXYCODONE (OXY IR/ROXICODONE) 5 MG IMMEDIATE RELEASE TABLET    Take 1 tablet (5 mg total) by mouth every 6 (six) hours as needed for severe pain.     SIGNIFICANT DIAGNOSTIC EXAMS  PREVIOUS   05-21-14: ct of chest: 1. Right upper lobe 5 mm nodule unchanged. 2. Additional nodules identified, some of which are noncalcified andvalso warrants follow-up. 3. Persistent mediastinal and hilar adenopathy. This may bevlong-standing given the previous reports of adenopathy. At the [facility] next exam, consider contrast-enhanced exam for further evaluation of the lymph nodes. 4. Given patient's history of smoking, follow-up chest CT at 6-12vmonths is recommended. 5. Coronary artery disease. . 01-10-15: ct of chest; 1. No acute findings. 2. Stable lung nodules as detailed above, the largest in the right upper lobe measuring 5 mm. Recommend additional unenhanced chest CT follow-up and 18 months per Fleischner criteria for following pulmonary nodules.  08-29-16: ct of abdomen and pelvis: 1. 4.4 x 5.7 x 7.5 cm pseudoaneurysm arising from the left aspect of a fem-fem bypass graft in the left inguinal region. Finding suggestive of graft failure. Superimposed infection not excluded. 2. Extensive atherosclerotic calcifications throughout the intra-abdominal aorta and its branch vessels. 3. 6 mm calculus at the right UVJ with secondary mild right hydroureteronephrosis. Additional nonobstructive right renal nephrolithiasis as above. 4. Large volume stool within the rectal vault, suggesting constipation.  08-31-16: ABI:  Unable to obtain right ABI due to non-detectable waveforms. Left ABI of 0.43 is suggestive of severe arterial occlusive disease at rest. Unable to obtain bilateral TBI&'s due to low amplitude waveforms. Other specific details can be found in the table(s) above.  TODAY;  09-07-16: ct angio of abdominal aorta with iliofemoral: VASCULAR 1. Poorly opacified  distal segment of the right to left femoral- femoral bypass graft and poorly opacified  new left femoral to popliteal bypass graft. Although some of this is felt to relate to contrast timing, there would be some concern of the potential for imminent thrombosis and vascular surgical assessment is recommended. The previously identified left groin pseudoaneurysm has been resected and repaired and there is no evidence of recurrent pseudoaneurysm or active contrast extravasation. Some postoperative changes are evident around the graft likely representing some hemorrhage that extends superiorly abutting the abdominal wall. 2. The native right common femoral artery does demonstrate significant disease and focal moderate stenosis just proximal to the bypass graft anastomosis which could account for some potential inflow restriction into the femoral-femoral graft. 3. Poor opacification at the level of bilateral popliteal and tibial arteries on the CTA results and lack of accurate patency assessment of these vessels. NON-VASCULAR 1. Stable nonobstructing right renal calculi and bladder calculus. 2. Fairly large amount of stool is present in the rectum.   09-07-16: chest x-ray: Small bilateral pleural effusions. Cardiomegaly without edema. Atherosclerosis. Fullness of the right hilum consistent with lymphadenopathy as seen on prior CT scan is unchanged.   09-08-16: TEE:   - Left ventricle: The cavity size was normal. Wall thickness was increased in a pattern of mild LVH. Systolic function was  severely reduced. The estimated ejection fraction was in the range of 25% to 30%. Diffuse hypokinesis. - Aortic valve: There was mild regurgitation. - Mitral valve: There was mild regurgitation. - Left atrium: The atrium was severely dilated. - Right atrium: The atrium was severely dilated. - Tricuspid valve: There was moderate regurgitation. - Pulmonary arteries: Systolic pressure was moderately increased. PA peak  pressure: 53 mm Hg (S).    LABS REVIEWED: PREVIOUS     09-14-15: hgb a1c 5.6 09-20-15: glucose 85; bun 26.8; creat 1.04; k+ 4.5; na++144; mag 1.7; dig 0.68  11-10-15; wbc 6.7 hgb 12.2; hct 37.3; mcv 86.0; plt 209; gucose 83; bun 24.1; creat 1.24; k+ 5.5; na++ 144; (creat clear 31.49); liver normal albumin 3.8; chol 117; ldl 41; trig 131; hdl 50   Urine micro-albumin <1.2  02-03-16: wbc 6.5; hgb 11.8; hct 38.9;mcv 92.7; plt 171; glucose 101; bun 29.2; creat 1.14; k+ 4.5; na++ 147; ca 10.4; (creat clear 34.25); liver normal albumin 3.8  05-16-16: wbc 6.9; hgb 11.7; ht 38.3; mcv 87.8; plt 186; glucose 107; bun 32.2; creat 1.08; k+ 4.4; na++ 142; ca 9.8; hgb a1c 6.9  06-26-16: tsh 4.68; dig <0.40 08-29-16: wbc 8.3; hgb 13.0; hct 38.7; mcv 79.5; plt 179; glucose 132; bun 28; creat 1.20; k+ 4.2; na++ 140; ca 9.8; liver normal albumin 3.6; urine culture; multiple bacteria   08-30-16: hgb a1c 6.6 09-01-16: wbc 12.0; hgb 8.7; hct 25.9; mcv 83.5; plt 115; glucose 134; bun 30; creat 1.44; k+ 4.0; na++ 138; ca 8.8   TODAY:   09-07-16; wbc 9.4; hgb 9.7; hct 29.8; mcv 85.1; plt 293; glucose 279; bun 13; creat 1.28; k+ 3.8; na++ 141; ca 9.2   1 of 2 blood culture: staph.  09-08-16: wbc 10.5; hgb 10;0; hct 31.2; mcv 84.8; plt 268; glucose 142; bun 14; creat 1.20; k+ 3.9; na++ 142; liver normal albumin 3.0 09-09-16: glucose 181; bun 23; creat 1.50; k+ 3.9; na++ 141; ca 9.2   Review of Systems  Unable to perform ROS: Dementia (cannot answer questions )    Physical Exam  Constitutional: No distress.  Frail   Neck: Neck supple.  Cardiovascular: Intact distal pulses.   Murmur heard. 1/6 murmur Is tachycardic at 140 Heart rate irregular  irregular   Pulmonary/Chest: Effort normal. No respiratory distress. She has wheezes.  Abdominal: Soft. Bowel sounds are normal. She exhibits no distension.  Peg tube present no signs of infection present   Musculoskeletal:  Hemiparesis on left side  Lymphadenopathy:    She has no  cervical adenopathy.  Neurological:  Aware   Skin: Skin is warm and dry. She is not diaphoretic.  Incision lines without sign of infection present     ASSESSMENT/ PLAN:  TODAY:   1. Gout no recent flares is stable  will continue allopurinol 100 mg daily   2. Hypertension: is stable  b/p 118/80: will continue: lopressor 50 mg twice daily  .   3. Afib: is tachycardic will continue   lopressor 50 mg twice daily will increase cardizem to 60 mg every 6 hours   4. Dementia: is declining  will continue aricept 10 mg daily; will monitor her weight is 125 pounds will need to continue to monitor   5. Diabetes: is stable  hgb a1c is 6.6  (previous 6.9) will continue lantus 7 units and humalog 5 units every 6hours for cbg>150   6. Chronic systolic heart failure:  Ef is  25-30% (09-08-16) (02-03-13 35-40%) has worsening  EF Is currently not on medications will not make changes will monitor  7. Dyslipidemia: stable ldl 41; trig 131: will continue lipitor 10 mg daily   8. Seizures: stable no reports of recent activity present; will continue keppra 250 mg twice daily  10. Depression: is without change; will continue remeron 7.5 mg nightly her zoloft was stopped   11. Pulmonary nodule: no change will continue ct scans every 18  months and will monitor her status.   Her ct scan was done Dec 2016  She declined ct scan this June.   12. CVA: is neurologically stable; has right hemiparesis; is no longer on asa will monitor   13. Weight loss: her current weight is 125pounds; is dependent upon tube feeding   14. Stage III chronic kidney disease: no significant change: bun 23; creat 1.23  15.left femoral pseudoaneurysm: had a left fem to fem bypass pseudoaneurysm and  thrombectomy on 08-30-16.    16. Acute blood loss anemia:is stable  is status post blood transfusion; hgb 8.7;will monitor will continue iron twice daily   17. Dysphagia: without change: is tube feeding dependent; is taking reglan 5 mg every  8 hours and ia taking protonix 40 mg twice daily    18. Pneumonia: is without changes will complete IV levaquin 750 mg daily through 11-06-16.   Will check cbc; cmp pre-albumin   Ok Edwards NP [facility]  Contact [phone] Monday through Friday 8am- 5pm  After hours call [phone]

## 2016-11-02 NOTE — Telephone Encounter (Signed)
Possible re-admission to facility. This is a patient you were seeing at [facility] . [facility] F/U is needed if patient was re-admitted to facility upon discharge. Hospital discharge from [facility] on 11/01/2016

## 2016-11-03 ENCOUNTER — Inpatient Hospital Stay (HOSPITAL_COMMUNITY)
Admission: EM | Admit: 2016-11-03 | Discharge: 2016-11-15 | DRG: 871 | Disposition: A | Discharge status: Skilled Nursing Facility | Payer: Medicare Other | Attending: Family Medicine | Admitting: Family Medicine

## 2016-11-03 ENCOUNTER — Encounter (HOSPITAL_COMMUNITY): Payer: Self-pay | Admitting: *Deleted

## 2016-11-03 ENCOUNTER — Emergency Department (HOSPITAL_COMMUNITY): Payer: Medicare Other

## 2016-11-03 DIAGNOSIS — Z8572 Personal history of non-Hodgkin lymphomas: Secondary | ICD-10-CM

## 2016-11-03 DIAGNOSIS — I82621 Acute embolism and thrombosis of deep veins of right upper extremity: Secondary | ICD-10-CM | POA: Diagnosis not present

## 2016-11-03 DIAGNOSIS — R0603 Acute respiratory distress: Secondary | ICD-10-CM

## 2016-11-03 DIAGNOSIS — N183 Chronic kidney disease, stage 3 unspecified: Secondary | ICD-10-CM | POA: Diagnosis present

## 2016-11-03 DIAGNOSIS — J690 Pneumonitis due to inhalation of food and vomit: Secondary | ICD-10-CM | POA: Diagnosis present

## 2016-11-03 DIAGNOSIS — Z931 Gastrostomy status: Secondary | ICD-10-CM

## 2016-11-03 DIAGNOSIS — D500 Iron deficiency anemia secondary to blood loss (chronic): Secondary | ICD-10-CM | POA: Diagnosis present

## 2016-11-03 DIAGNOSIS — K219 Gastro-esophageal reflux disease without esophagitis: Secondary | ICD-10-CM | POA: Diagnosis present

## 2016-11-03 DIAGNOSIS — R4182 Altered mental status, unspecified: Secondary | ICD-10-CM

## 2016-11-03 DIAGNOSIS — E1169 Type 2 diabetes mellitus with other specified complication: Secondary | ICD-10-CM | POA: Diagnosis present

## 2016-11-03 DIAGNOSIS — R7989 Other specified abnormal findings of blood chemistry: Secondary | ICD-10-CM

## 2016-11-03 DIAGNOSIS — I2510 Atherosclerotic heart disease of native coronary artery without angina pectoris: Secondary | ICD-10-CM | POA: Diagnosis present

## 2016-11-03 DIAGNOSIS — A419 Sepsis, unspecified organism: Secondary | ICD-10-CM | POA: Diagnosis not present

## 2016-11-03 DIAGNOSIS — I482 Chronic atrial fibrillation: Secondary | ICD-10-CM | POA: Diagnosis present

## 2016-11-03 DIAGNOSIS — E1151 Type 2 diabetes mellitus with diabetic peripheral angiopathy without gangrene: Secondary | ICD-10-CM | POA: Diagnosis present

## 2016-11-03 DIAGNOSIS — E1122 Type 2 diabetes mellitus with diabetic chronic kidney disease: Secondary | ICD-10-CM | POA: Diagnosis present

## 2016-11-03 DIAGNOSIS — R748 Abnormal levels of other serum enzymes: Secondary | ICD-10-CM | POA: Diagnosis present

## 2016-11-03 DIAGNOSIS — E440 Moderate protein-calorie malnutrition: Secondary | ICD-10-CM | POA: Diagnosis present

## 2016-11-03 DIAGNOSIS — Z794 Long term (current) use of insulin: Secondary | ICD-10-CM

## 2016-11-03 DIAGNOSIS — J189 Pneumonia, unspecified organism: Secondary | ICD-10-CM | POA: Diagnosis present

## 2016-11-03 DIAGNOSIS — E870 Hyperosmolality and hypernatremia: Secondary | ICD-10-CM | POA: Diagnosis present

## 2016-11-03 DIAGNOSIS — I130 Hypertensive heart and chronic kidney disease with heart failure and stage 1 through stage 4 chronic kidney disease, or unspecified chronic kidney disease: Secondary | ICD-10-CM | POA: Diagnosis present

## 2016-11-03 DIAGNOSIS — R1319 Other dysphagia: Secondary | ICD-10-CM

## 2016-11-03 DIAGNOSIS — G40909 Epilepsy, unspecified, not intractable, without status epilepticus: Secondary | ICD-10-CM | POA: Diagnosis present

## 2016-11-03 DIAGNOSIS — R532 Functional quadriplegia: Secondary | ICD-10-CM | POA: Diagnosis present

## 2016-11-03 DIAGNOSIS — E1140 Type 2 diabetes mellitus with diabetic neuropathy, unspecified: Secondary | ICD-10-CM | POA: Diagnosis present

## 2016-11-03 DIAGNOSIS — I4891 Unspecified atrial fibrillation: Secondary | ICD-10-CM | POA: Diagnosis present

## 2016-11-03 DIAGNOSIS — E785 Hyperlipidemia, unspecified: Secondary | ICD-10-CM | POA: Diagnosis present

## 2016-11-03 DIAGNOSIS — L89619 Pressure ulcer of right heel, unspecified stage: Secondary | ICD-10-CM | POA: Diagnosis present

## 2016-11-03 DIAGNOSIS — N390 Urinary tract infection, site not specified: Secondary | ICD-10-CM | POA: Diagnosis present

## 2016-11-03 DIAGNOSIS — Z8673 Personal history of transient ischemic attack (TIA), and cerebral infarction without residual deficits: Secondary | ICD-10-CM

## 2016-11-03 DIAGNOSIS — F0150 Vascular dementia without behavioral disturbance: Secondary | ICD-10-CM | POA: Diagnosis present

## 2016-11-03 DIAGNOSIS — T148XXA Other injury of unspecified body region, initial encounter: Secondary | ICD-10-CM | POA: Insufficient documentation

## 2016-11-03 DIAGNOSIS — E1165 Type 2 diabetes mellitus with hyperglycemia: Secondary | ICD-10-CM | POA: Diagnosis present

## 2016-11-03 DIAGNOSIS — Z87891 Personal history of nicotine dependence: Secondary | ICD-10-CM

## 2016-11-03 DIAGNOSIS — I5022 Chronic systolic (congestive) heart failure: Secondary | ICD-10-CM | POA: Diagnosis present

## 2016-11-03 DIAGNOSIS — I5023 Acute on chronic systolic (congestive) heart failure: Secondary | ICD-10-CM | POA: Diagnosis not present

## 2016-11-03 DIAGNOSIS — F419 Anxiety disorder, unspecified: Secondary | ICD-10-CM | POA: Diagnosis present

## 2016-11-03 DIAGNOSIS — I472 Ventricular tachycardia: Secondary | ICD-10-CM | POA: Diagnosis present

## 2016-11-03 DIAGNOSIS — E872 Acidosis: Secondary | ICD-10-CM | POA: Diagnosis present

## 2016-11-03 DIAGNOSIS — R0602 Shortness of breath: Secondary | ICD-10-CM

## 2016-11-03 DIAGNOSIS — R1314 Dysphagia, pharyngoesophageal phase: Secondary | ICD-10-CM | POA: Diagnosis present

## 2016-11-03 DIAGNOSIS — G9341 Metabolic encephalopathy: Secondary | ICD-10-CM | POA: Diagnosis present

## 2016-11-03 DIAGNOSIS — R569 Unspecified convulsions: Secondary | ICD-10-CM

## 2016-11-03 DIAGNOSIS — L8990 Pressure ulcer of unspecified site, unspecified stage: Secondary | ICD-10-CM | POA: Insufficient documentation

## 2016-11-03 DIAGNOSIS — L89629 Pressure ulcer of left heel, unspecified stage: Secondary | ICD-10-CM | POA: Diagnosis present

## 2016-11-03 DIAGNOSIS — R778 Other specified abnormalities of plasma proteins: Secondary | ICD-10-CM | POA: Diagnosis present

## 2016-11-03 DIAGNOSIS — Z23 Encounter for immunization: Secondary | ICD-10-CM

## 2016-11-03 DIAGNOSIS — J9621 Acute and chronic respiratory failure with hypoxia: Secondary | ICD-10-CM | POA: Diagnosis not present

## 2016-11-03 DIAGNOSIS — R1310 Dysphagia, unspecified: Secondary | ICD-10-CM

## 2016-11-03 DIAGNOSIS — R0682 Tachypnea, not elsewhere classified: Secondary | ICD-10-CM

## 2016-11-03 DIAGNOSIS — H548 Legal blindness, as defined in USA: Secondary | ICD-10-CM | POA: Diagnosis present

## 2016-11-03 DIAGNOSIS — F0390 Unspecified dementia without behavioral disturbance: Secondary | ICD-10-CM | POA: Diagnosis present

## 2016-11-03 DIAGNOSIS — I4581 Long QT syndrome: Secondary | ICD-10-CM | POA: Diagnosis present

## 2016-11-03 LAB — URINALYSIS, ROUTINE W REFLEX MICROSCOPIC
Bilirubin Urine: NEGATIVE
Glucose, UA: 50 mg/dL — AB
Hgb urine dipstick: NEGATIVE
KETONES UR: NEGATIVE mg/dL
Nitrite: NEGATIVE
PH: 5 (ref 5.0–8.0)
Protein, ur: 100 mg/dL — AB
SPECIFIC GRAVITY, URINE: 1.018 (ref 1.005–1.030)

## 2016-11-03 LAB — COMPREHENSIVE METABOLIC PANEL
ALBUMIN: 2.8 g/dL — AB (ref 3.5–5.0)
ALT: 17 U/L (ref 14–54)
ANION GAP: 13 (ref 5–15)
AST: 26 U/L (ref 15–41)
Alkaline Phosphatase: 115 U/L (ref 38–126)
BUN: 65 mg/dL — AB (ref 6–20)
CHLORIDE: 102 mmol/L (ref 101–111)
CO2: 33 mmol/L — AB (ref 22–32)
Calcium: 10.7 mg/dL — ABNORMAL HIGH (ref 8.9–10.3)
Creatinine, Ser: 1.76 mg/dL — ABNORMAL HIGH (ref 0.44–1.00)
GFR calc Af Amer: 31 mL/min — ABNORMAL LOW (ref >60)
GFR calc non Af Amer: 27 mL/min — ABNORMAL LOW (ref >60)
GLUCOSE: 350 mg/dL — AB (ref 65–99)
POTASSIUM: 4 mmol/L (ref 3.5–5.1)
SODIUM: 148 mmol/L — AB (ref 135–145)
TOTAL PROTEIN: 7.9 g/dL (ref 6.5–8.1)
Total Bilirubin: 0.5 mg/dL (ref 0.3–1.2)

## 2016-11-03 LAB — I-STAT CG4 LACTIC ACID, ED: Lactic Acid, Venous: 3.65 mmol/L (ref 0.5–1.9)

## 2016-11-03 LAB — CBC WITH DIFFERENTIAL/PLATELET
BASOS PCT: 1 %
Basophils Absolute: 0.1 10*3/uL (ref 0.0–0.1)
EOS ABS: 0 10*3/uL (ref 0.0–0.7)
Eosinophils Relative: 0 %
HCT: 31.7 % — ABNORMAL LOW (ref 36.0–46.0)
Hemoglobin: 9.5 g/dL — ABNORMAL LOW (ref 12.0–15.0)
LYMPHS ABS: 0.7 10*3/uL (ref 0.7–4.0)
LYMPHS PCT: 7 %
MCH: 25.4 pg — ABNORMAL LOW (ref 26.0–34.0)
MCHC: 30 g/dL (ref 30.0–36.0)
MCV: 84.8 fL (ref 78.0–100.0)
Monocytes Absolute: 0.5 10*3/uL (ref 0.1–1.0)
Monocytes Relative: 5 %
NEUTROS ABS: 8.8 10*3/uL — AB (ref 1.7–7.7)
NEUTROS PCT: 87 %
PLATELETS: 333 10*3/uL (ref 150–400)
RBC: 3.74 MIL/uL — ABNORMAL LOW (ref 3.87–5.11)
RDW: 17.3 % — AB (ref 11.5–15.5)
WBC: 10.1 10*3/uL (ref 4.0–10.5)

## 2016-11-03 LAB — PROTIME-INR
INR: 1.32
PROTHROMBIN TIME: 16.2 s — AB (ref 11.4–15.2)

## 2016-11-03 LAB — POC OCCULT BLOOD, ED: Fecal Occult Bld: POSITIVE — AB

## 2016-11-03 MED ORDER — LEVOFLOXACIN IN D5W 750 MG/150ML IV SOLN
750.0000 mg | Freq: Once | INTRAVENOUS | Status: AC
  Administered 2016-11-03: 750 mg via INTRAVENOUS
  Filled 2016-11-03: qty 150

## 2016-11-03 MED ORDER — DEXTROSE 5 % IV SOLN
2.0000 g | Freq: Once | INTRAVENOUS | Status: AC
  Administered 2016-11-03: 2 g via INTRAVENOUS
  Filled 2016-11-03: qty 2

## 2016-11-03 MED ORDER — DILTIAZEM LOAD VIA INFUSION
10.0000 mg | Freq: Once | INTRAVENOUS | Status: AC
  Administered 2016-11-04: 5 mg via INTRAVENOUS
  Filled 2016-11-03: qty 10

## 2016-11-03 MED ORDER — SODIUM CHLORIDE 0.9 % IV BOLUS (SEPSIS)
1000.0000 mL | Freq: Once | INTRAVENOUS | Status: AC
  Administered 2016-11-03: 1000 mL via INTRAVENOUS

## 2016-11-03 MED ORDER — DEXTROSE 5 % IV SOLN
5.0000 mg/h | INTRAVENOUS | Status: DC
  Administered 2016-11-03: 5 mg/h via INTRAVENOUS
  Filled 2016-11-03: qty 100

## 2016-11-03 MED ORDER — SODIUM CHLORIDE 0.9 % IV BOLUS (SEPSIS)
500.0000 mL | Freq: Once | INTRAVENOUS | Status: AC
  Administered 2016-11-03: 500 mL via INTRAVENOUS

## 2016-11-03 MED ORDER — SODIUM CHLORIDE 0.9 % IV BOLUS (SEPSIS)
250.0000 mL | Freq: Once | INTRAVENOUS | Status: AC
  Administered 2016-11-03: 250 mL via INTRAVENOUS

## 2016-11-03 MED ORDER — VANCOMYCIN HCL IN DEXTROSE 1-5 GM/200ML-% IV SOLN
1000.0000 mg | Freq: Once | INTRAVENOUS | Status: AC
  Administered 2016-11-03: 1000 mg via INTRAVENOUS
  Filled 2016-11-03: qty 200

## 2016-11-03 NOTE — ED Provider Notes (Addendum)
[facility] DEPT Provider Note   CSN: [id number] Arrival date & time: 11/03/16  2153     History   Chief Complaint Chief Complaint  Patient presents with  . Altered Mental Status    HPI Casey [name] is a 76 y.o. female. Level V caveat secondary to altered mental status HPI  76 year old female history of CVA, dementia, chronic kidney disease, seizures disorder, dementia, chronic systolic CHF, type 2 diabetes, chronic atrial fibrillation with has been rate controlled presents today from long-term care facility with reports that she has been less responsive and has had increased respiratory rate. They report the patient is normally oriented to person but not place. She has been less responsive to them today.  Past Medical History:  Diagnosis Date  . Abdominal or pelvic swelling, mass, or lump, left upper quadrant   . CHF (congestive heart failure) ([place])   . Chronic atrial fibrillation ([place])   . Chronic kidney disease   . Congestive heart failure, unspecified   . Coronary atherosclerosis of unspecified type of vessel, native or graft   . Diabetes mellitus   . Edema   . GERD (gastroesophageal reflux disease)   . Gout, unspecified   . Hypercholesterolemia   . Hypertension   . 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  . Peripheral vascular disease, unspecified ([place])   . Seizures ([place])   . Sinus of Valsalva aneurysm    a. By 2D echo 05/2011.  . Stroke ([place])   . Unspecified hereditary and idiopathic peripheral neuropathy   . Unspecified urinary incontinence   . Vascular dementia, uncomplicated     Patient Active Problem List   Diagnosis Date Noted  . Acute renal failure superimposed on stage 3 chronic kidney disease ([place]) 10/05/2016  . Thrombocytopenia ([place]) 10/05/2016  . Esophageal dysphagia 10/05/2016  . Candida UTI  10/05/2016  . Anemia due to gastrointestinal blood loss 10/05/2016  . Seizure disorder ([place]) 10/05/2016  . Hypokalemia 10/05/2016  . Hypomagnesemia 10/05/2016  . Hypernatremia 10/05/2016  . Severe protein-calorie malnutrition ([place]) 10/05/2016  . Failure to thrive in adult 10/05/2016  . HCAP (healthcare-associated pneumonia)   . Encounter for palliative care   . Goals of care, counseling/discussion   . Acute respiratory failure with hypoxia ([place])   . Acute on chronic systolic CHF (congestive heart failure), NYHA class 4 ([place])   . DCM (dilated cardiomyopathy) ([place])   . Pneumonia due to gram-positive bacteria 09/14/2016  . Hypotension 09/10/2016  . Chronic kidney disease (CKD), stage III (moderate) ([place]) 09/10/2016  . Acute generalized abdominal pain 09/07/2016  . Acute chest pain 09/07/2016  . Atrial fibrillation with rapid ventricular response ([place]) 09/07/2016  . Wound discharge 09/07/2016  . Anemia 09/07/2016  . Femoral-femoral bypass graft thrombosis, left ([place]) 09/03/2016  . Pseudoaneurysm of femoral artery ([place]) 08/30/2016  . Vascular dementia without behavioral disturbance 08/29/2016  . History of CVA with residual deficit 08/29/2016  . Type 2 diabetes mellitus with diabetic neuropathy, without long-term current use of insulin ([place]) 08/29/2016  . Depression 04/24/2016  . Loss of weight 04/24/2016  . Hypertensive heart disease with congestive heart failure ([place]) 12/30/2014  . Dyslipidemia associated with type 2 diabetes mellitus ([place]) 12/30/2014  . CKD stage 3 due to type 2 diabetes mellitus ([place]) 12/06/2014  . 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 Chronic systolic congestive heart failure ([place]) 02/09/2013  .  Solitary pulmonary nodule 02/09/2013  . Seizures ([place])   . PVD (peripheral vascular disease) ([place]) 09/30/2012  . Gout of multiple sites 05/06/2012  . Dementia without behavioral disturbance 05/06/2012  . Atherosclerosis of native artery of extremity with intermittent claudication  ([place]) 02/20/2012  . AKI (acute kidney injury) ([place]) 06/18/2011  . Type 2 diabetes mellitus with diabetic neuropathy ([place]) 12/29/2010  . Atrial fibrillation ([place])   . Late effects of CVA (cerebrovascular accident)     Past Surgical History:  Procedure Laterality Date  . AORTOGRAM  09/01/2008   Abd w/ bilateral lower extremity runoff arteriography  . APPLICATION OF WOUND VAC Left 08/30/2016   Procedure: APPLICATION OF WOUND VAC LEFT GROIN;  Surgeon: Serafina Mitchell, MD;  Location: [facility];  Service: Vascular;  Laterality: Left;  . ENDARTERECTOMY FEMORAL Left 08/30/2016   Procedure: LEFT COMMON FEMORAL ARTERY ENDARTERECTOMY;  Surgeon: Serafina Mitchell, MD;  Location: [facility];  Service: Vascular;  Laterality: Left;  . FALSE ANEURYSM REPAIR  12/06/2011   Procedure: REPAIR FALSE ANEURYSM;  Surgeon: Angelia Mould, MD;  Location: [facility] OR;  Service: Vascular;  Laterality: Left;  Repair of left femoral Artery pseudoaneurysm  . FALSE ANEURYSM REPAIR Left 08/30/2016   Procedure: REPAIR PSEUDOANEURYSM LEFT GROIN;  Surgeon: Serafina Mitchell, MD;  Location: [facility];  Service: Vascular;  Laterality: Left;  . FEMORAL ARTERY EXPLORATION  12/06/2011   Procedure: FEMORAL ARTERY EXPLORATION;  Surgeon: Angelia Mould, MD;  Location[facility];  Service: Vascular;  Laterality: N/A;  Exploration of large pseudoaneurysm left side of fem-fem bypass graft   . femoral to femoral bypass graft     . FEMORAL-FEMORAL BYPASS GRAFT  12/06/2011   Procedure: BYPASS GRAFT FEMORAL-FEMORAL ARTERY;  Surgeon: Angelia Mould, MD;  Location: [facility] OR;  Service: Vascular;  Laterality: Bilateral;  Revision of left to right Femoral-Femoral bypass graft  . FEMORAL-FEMORAL BYPASS GRAFT Left 08/30/2016   Procedure: REDO RIGHT TO LEFT FEMORAL-FEMORAL ARTERY BYPASS GRAFT;  Surgeon: Serafina Mitchell, MD;  Location: [facility];  Service: Vascular;  Laterality: Left;  . FEMORAL-POPLITEAL BYPASS GRAFT Left 05/2002   lower extremity femoral to below knee  w/ non-versed greater saphenous vein  . FEMORAL-POPLITEAL BYPASS GRAFT Left 11/2002  . FEMORAL-POPLITEAL BYPASS GRAFT Left 08/30/2016   Procedure: REVISION OF LEFT FEMORAL-POPLITEAL ARTERY BYPASS GRAFT;  Surgeon: Serafina Mitchell, MD;  Location: [facility] OR;  Service: Vascular;  Laterality: Left;  . IR GASTROSTOMY TUBE MOD SED  10/18/2016  . THROMBECTOMY FEMORAL ARTERY Left 08/30/2016   Procedure: THROMBECTOMY OF FEMORAL-FEMORAL ARTERY BYPASS GRAFT AND LEFT FEMORAL TO LEFT POPLITEAL BYPASS GRAFT;  Surgeon: Serafina Mitchell, MD;  Location: [facility];  Service: Vascular;  Laterality: Left;  . TUBAL LIGATION Bilateral     OB History    No data available       Home Medications    Prior to Admission medications   Medication Sig Start Date End Date Taking? Authorizing Provider  allopurinol (ZYLOPRIM) 100 MG tablet Take 100 mg by mouth daily.    [provider]  Amantadine HCl 100 MG tablet Place 100 mg into feeding tube 2 (two) times daily.    [provider]  Amino Acids-Protein Hydrolys (FEEDING SUPPLEMENT, PRO-STAT SUGAR FREE 64,) LIQD Place 30 mLs into feeding tube 2 (two) times daily.    [provider]  atorvastatin (LIPITOR) 10 MG tablet Place 10 mg into feeding tube at bedtime.     [provider]  diltiazem [place])  30 MG tablet Give 1 tablet via G-tube every 6 hours    [provider]  donepezil (ARICEPT) 10 MG tablet Place 10 mg into feeding tube at bedtime.    [provider]  ferrous sulfate 325 (65 FE) MG tablet Give 1 tablet via G-Tube tow times daily    [provider]  insulin glargine (LANTUS) 100 unit/mL SOPN Inject 7 Units into the skin at bedtime.    [provider]  insulin lispro (HUMALOG) 100 UNIT/ML injection Inject 5 Units into the skin every 6 (six) hours.    [provider]  levETIRAcetam (KEPPRA) 250 MG tablet Give 1 tablet via G-Tube two times daily    [provider]  levofloxacin  (LEVAQUIN) 750 MG/150ML SOLN Inject 750 mg into the vein daily. Until 11/06/16    [provider]  metoCLOPramide (REGLAN) 5 MG tablet Place 5 mg into feeding tube every 8 (eight) hours.    [provider]  metoprolol tartrate (LOPRESSOR) 25 MG tablet Give 1 tablets (50mg ) via G-tube in the morning and 1 tablet by mouth at bedtime    [provider]  mirtazapine (REMERON) 7.5 MG tablet Place 7.5 mg into feeding tube at bedtime.     [provider]  Multiple Vitamins-Minerals (THERA-M) TABS 1 tablet by PEG Tube route daily.    [provider]  pantoprazole (PROTONIX) 40 MG tablet Give 1 tablet via G-Tube tow times a day    [provider]  zinc sulfate 220 (50 Zn) MG capsule Place 220 mg into feeding tube at bedtime.    [provider]    Family History Family History  Problem Relation Age of Onset  . Cancer Mother        Cervical  . Stroke Mother   . Diabetes Mother   . Stroke Father   . Heart disease Father     Social History Social History  Substance Use Topics  . Smoking status: Former Smoker    Types: Cigarettes    Quit date: 01/29/2001  . Smokeless tobacco: Never Used  . Alcohol use No     Allergies   Penicillins   Review of Systems Review of Systems  Unable to perform ROS: Mental status change     Physical Exam Updated Vital Signs BP (!) 155/109   Pulse (!) 163   Temp 99.5 F (37.5 C) (Temporal)   Resp (!) 46   SpO2 99%   Physical Exam  Constitutional: She appears well-developed.  HENT:  Head: Normocephalic.  Right Ear: External ear normal.  Left Ear: External ear normal.  Neck: Normal range of motion. Neck supple.  Cardiovascular: An irregularly irregular rhythm present. Tachycardia present.   Pulmonary/Chest:  Increased work of breathing and decreased breath sounds throughout  Abdominal: Soft. Bowel sounds are normal. There is no tenderness.  Musculoskeletal:  Patient with pressure ulcers  bilateral heels  Neurological:  Patient's eyes are closed and she moans to pain She does not appear to have any purposeful movement    Skin: Skin is warm and dry. Capillary refill takes less than 2 seconds.  Skin with pressure ulcers bilateral heels and sacrum  Nursing note and vitals reviewed.    ED Treatments / Results  Labs (all labs ordered are listed, but only abnormal results are displayed) Labs Reviewed  CULTURE, BLOOD (ROUTINE X 2)  CULTURE, BLOOD (ROUTINE X 2)  COMPREHENSIVE METABOLIC PANEL  CBC WITH DIFFERENTIAL/PLATELET  PROTIME-INR  URINALYSIS, ROUTINE W REFLEX MICROSCOPIC  I-STAT CG4 LACTIC ACID, ED    EKG  EKG Interpretation  Date/Time:  Saturday November 03 2016 21:58:08 EDT Ventricular Rate:  162 PR Interval:    QRS Duration: 121 QT Interval:  297 QTC Calculation: 510 R Axis:   -76 Text Interpretation:  Atrial fibrillation with rapid ventricular response Non-specific intra-ventricular conduction delay Confirmed by Pattricia Boss [phone]) on 11/03/2016 10:37:22 PM       Radiology Dg Chest Portable 1 View  Result Date: 11/03/2016 CLINICAL DATA:  Altered mental status, shortness of breath, hypertension, diabetes mellitus, former smoker, CHF, history lymphoma EXAM: PORTABLE CHEST 1 VIEW COMPARISON:  Portable exam 2204 hours compared to 10/29/2016 FINDINGS: Enlargement of cardiac silhouette with mild pulmonary vascular congestion. Atherosclerotic calcification aorta. Mediastinal contours and pulmonary vascularity normal. Persistent atelectasis versus infiltrate in LEFT lower lobe. Minimal perihilar edema. Peribronchial thickening persists. Mild RIGHT basilar atelectasis. No gross pleural effusion or pneumothorax. Bones demineralized. IMPRESSION: Enlargement of cardiac silhouette with pulmonary vascular congestion and question minimal pulmonary edema. RIGHT basilar atelectasis with persistent atelectasis versus consolidation in LEFT lower lobe. Electronically Signed    By: Lavonia Dana M.D.   On: 11/03/2016 22:33    Procedures Procedures (including critical care time)  Medications Ordered in ED Medications - No data to display   Initial Impression / Assessment and Plan / ED Course  I have reviewed the triage vital signs and the nursing notes.  Pertinent labs & imaging results that were available during my care of the patient were reviewed by me and considered in my medical decision making (see chart for details). 1- sepsis/patient febrile here with elevated lactic acid.Pateient with a fib with rvr and receiving fluid bolus here. Patient with recent pneumonia now with persistent infiltrate/atelectasis lll and atelectasis rll 2- a fib rvr- suspect due to volume depletion.  However, cardizem being started for rate control. Troponin is elevated at 0.35. Suspect demand ischemia given her A. fib with RVR and sepsis. 3- altered mental status- from previous reports patient has functional quadriplegia and dementia and noted be somewhat less responsive than usual but no focal deficits 12:28 AM Patient has become much more responsive with her heart rate decreasing to approximately 100-110. She opens her eyes and answers some questions including her name. He is to be flaccid on the right but with some purposeful movement on the left. 4- hypernatremia 5- hyperglycemia 6- anemia- appears stable, but is hemocult positive here Critical care consulted. We discussed that they would not be likely to admit unless patient was acidotic. ABG completed that shows pH 7.47 PCO2 of 43 and PO2 of 86 CRITICAL CARE Performed by: Kloi Brodman S Total critical care time: 60 minutes Critical care time was exclusive of separately billable procedures and treating other patients. Critical care was necessary to treat or prevent imminent or life-threatening deterioration. Critical care was time spent personally by me on the following activities: development of treatment plan with patient  and/or surrogate as well as nursing, discussions with consultants, evaluation of patient's response to treatment, examination of patient, obtaining history from patient or surrogate, ordering and performing treatments and interventions, ordering and review of laboratory studies, ordering and review of radiographic studies, pulse oximetry and re-evaluation of patient's condition.  Final Clinical Impressions(s) / ED Diagnoses   Final diagnoses:  Altered mental status, unspecified altered mental status type  Sepsis, due to unspecified organism [facility])  Atrial fibrillation with RVR ([place])    New Prescriptions New Prescriptions   No medications on file  Pattricia Boss, MD 11/04/16 [id number]    Pattricia Boss, MD 11/04/16 [phone]

## 2016-11-03 NOTE — ED Notes (Signed)
Nurse currently drawing blood.

## 2016-11-03 NOTE — ED Triage Notes (Signed)
Pt arrived by EMS from [facility]; dayshift RN reported that pt had not been feeling well all day, nightshift RN noted that pt was more altered that her baseline, with increased shortness of breath. Oxygen sats 90% at the facility on pt's normal 3L with respiratory rate in the 40s.. Pt has hx of baseline and is normally able to communicate in full sentences with some confusion at times . Pt denies pain at present. Pressure ulcer noted to R wrist. EMS also reports pt had a recent PICC line removal; unknown why pt had a PICC

## 2016-11-04 ENCOUNTER — Inpatient Hospital Stay (HOSPITAL_COMMUNITY): Payer: Medicare Other

## 2016-11-04 DIAGNOSIS — E870 Hyperosmolality and hypernatremia: Secondary | ICD-10-CM | POA: Diagnosis present

## 2016-11-04 DIAGNOSIS — E872 Acidosis, unspecified: Secondary | ICD-10-CM | POA: Insufficient documentation

## 2016-11-04 DIAGNOSIS — E1122 Type 2 diabetes mellitus with diabetic chronic kidney disease: Secondary | ICD-10-CM | POA: Diagnosis present

## 2016-11-04 DIAGNOSIS — I4891 Unspecified atrial fibrillation: Secondary | ICD-10-CM | POA: Diagnosis not present

## 2016-11-04 DIAGNOSIS — J9621 Acute and chronic respiratory failure with hypoxia: Secondary | ICD-10-CM | POA: Diagnosis not present

## 2016-11-04 DIAGNOSIS — I361 Nonrheumatic tricuspid (valve) insufficiency: Secondary | ICD-10-CM

## 2016-11-04 DIAGNOSIS — A419 Sepsis, unspecified organism: Secondary | ICD-10-CM | POA: Diagnosis present

## 2016-11-04 DIAGNOSIS — R778 Other specified abnormalities of plasma proteins: Secondary | ICD-10-CM | POA: Diagnosis present

## 2016-11-04 DIAGNOSIS — J189 Pneumonia, unspecified organism: Secondary | ICD-10-CM | POA: Diagnosis not present

## 2016-11-04 DIAGNOSIS — R1314 Dysphagia, pharyngoesophageal phase: Secondary | ICD-10-CM | POA: Diagnosis present

## 2016-11-04 DIAGNOSIS — R748 Abnormal levels of other serum enzymes: Secondary | ICD-10-CM | POA: Diagnosis present

## 2016-11-04 DIAGNOSIS — R4182 Altered mental status, unspecified: Secondary | ICD-10-CM | POA: Diagnosis not present

## 2016-11-04 DIAGNOSIS — E785 Hyperlipidemia, unspecified: Secondary | ICD-10-CM | POA: Diagnosis not present

## 2016-11-04 DIAGNOSIS — F0150 Vascular dementia without behavioral disturbance: Secondary | ICD-10-CM | POA: Diagnosis not present

## 2016-11-04 DIAGNOSIS — Z23 Encounter for immunization: Secondary | ICD-10-CM | POA: Diagnosis present

## 2016-11-04 DIAGNOSIS — I82621 Acute embolism and thrombosis of deep veins of right upper extremity: Secondary | ICD-10-CM | POA: Diagnosis not present

## 2016-11-04 DIAGNOSIS — R609 Edema, unspecified: Secondary | ICD-10-CM | POA: Diagnosis not present

## 2016-11-04 DIAGNOSIS — J690 Pneumonitis due to inhalation of food and vomit: Secondary | ICD-10-CM | POA: Diagnosis not present

## 2016-11-04 DIAGNOSIS — I340 Nonrheumatic mitral (valve) insufficiency: Secondary | ICD-10-CM

## 2016-11-04 DIAGNOSIS — N390 Urinary tract infection, site not specified: Secondary | ICD-10-CM | POA: Diagnosis present

## 2016-11-04 DIAGNOSIS — I5023 Acute on chronic systolic (congestive) heart failure: Secondary | ICD-10-CM | POA: Diagnosis not present

## 2016-11-04 DIAGNOSIS — E1151 Type 2 diabetes mellitus with diabetic peripheral angiopathy without gangrene: Secondary | ICD-10-CM | POA: Diagnosis present

## 2016-11-04 DIAGNOSIS — R7989 Other specified abnormal findings of blood chemistry: Secondary | ICD-10-CM

## 2016-11-04 DIAGNOSIS — Z931 Gastrostomy status: Secondary | ICD-10-CM | POA: Diagnosis not present

## 2016-11-04 DIAGNOSIS — M7989 Other specified soft tissue disorders: Secondary | ICD-10-CM | POA: Diagnosis not present

## 2016-11-04 DIAGNOSIS — G40909 Epilepsy, unspecified, not intractable, without status epilepticus: Secondary | ICD-10-CM | POA: Diagnosis present

## 2016-11-04 DIAGNOSIS — E440 Moderate protein-calorie malnutrition: Secondary | ICD-10-CM | POA: Diagnosis present

## 2016-11-04 DIAGNOSIS — G9341 Metabolic encephalopathy: Secondary | ICD-10-CM

## 2016-11-04 DIAGNOSIS — R532 Functional quadriplegia: Secondary | ICD-10-CM | POA: Diagnosis present

## 2016-11-04 DIAGNOSIS — I130 Hypertensive heart and chronic kidney disease with heart failure and stage 1 through stage 4 chronic kidney disease, or unspecified chronic kidney disease: Secondary | ICD-10-CM | POA: Diagnosis present

## 2016-11-04 DIAGNOSIS — I5022 Chronic systolic (congestive) heart failure: Secondary | ICD-10-CM | POA: Diagnosis not present

## 2016-11-04 DIAGNOSIS — E1165 Type 2 diabetes mellitus with hyperglycemia: Secondary | ICD-10-CM | POA: Diagnosis present

## 2016-11-04 DIAGNOSIS — E1169 Type 2 diabetes mellitus with other specified complication: Secondary | ICD-10-CM | POA: Diagnosis not present

## 2016-11-04 DIAGNOSIS — N183 Chronic kidney disease, stage 3 (moderate): Secondary | ICD-10-CM | POA: Diagnosis not present

## 2016-11-04 DIAGNOSIS — E1140 Type 2 diabetes mellitus with diabetic neuropathy, unspecified: Secondary | ICD-10-CM | POA: Diagnosis present

## 2016-11-04 DIAGNOSIS — I472 Ventricular tachycardia: Secondary | ICD-10-CM | POA: Diagnosis present

## 2016-11-04 LAB — CBG MONITORING, ED: GLUCOSE-CAPILLARY: 306 mg/dL — AB (ref 65–99)

## 2016-11-04 LAB — I-STAT ARTERIAL BLOOD GAS, ED
ACID-BASE EXCESS: 8 mmol/L — AB (ref 0.0–2.0)
BICARBONATE: 32.5 mmol/L — AB (ref 20.0–28.0)
O2 SAT: 97 %
PO2 ART: 86 mmHg (ref 83.0–108.0)
TCO2: 34 mmol/L — AB (ref 22–32)
pCO2 arterial: 43.8 mmHg (ref 32.0–48.0)
pH, Arterial: 7.479 — ABNORMAL HIGH (ref 7.350–7.450)

## 2016-11-04 LAB — LIPID PANEL
CHOL/HDL RATIO: 2 ratio
Cholesterol: 94 mg/dL (ref 0–200)
HDL: 46 mg/dL (ref >40)
LDL Cholesterol: 34 mg/dL (ref 0–99)
Triglycerides: 69 mg/dL (ref <150)
VLDL: 14 mg/dL (ref 0–40)

## 2016-11-04 LAB — BASIC METABOLIC PANEL
ANION GAP: 9 (ref 5–15)
BUN: 55 mg/dL — ABNORMAL HIGH (ref 6–20)
CHLORIDE: 113 mmol/L — AB (ref 101–111)
CO2: 27 mmol/L (ref 22–32)
CREATININE: 1.49 mg/dL — AB (ref 0.44–1.00)
Calcium: 9.4 mg/dL (ref 8.9–10.3)
GFR calc non Af Amer: 33 mL/min — ABNORMAL LOW (ref >60)
GFR, EST AFRICAN AMERICAN: 38 mL/min — AB (ref >60)
Glucose, Bld: 104 mg/dL — ABNORMAL HIGH (ref 65–99)
Potassium: 3.7 mmol/L (ref 3.5–5.1)
Sodium: 149 mmol/L — ABNORMAL HIGH (ref 135–145)

## 2016-11-04 LAB — GLUCOSE, CAPILLARY
GLUCOSE-CAPILLARY: 58 mg/dL — AB (ref 65–99)
GLUCOSE-CAPILLARY: 108 mg/dL — AB (ref 65–99)
Glucose-Capillary: 242 mg/dL — ABNORMAL HIGH (ref 65–99)
Glucose-Capillary: 145 mg/dL — ABNORMAL HIGH (ref 65–99)
Glucose-Capillary: 88 mg/dL (ref 65–99)

## 2016-11-04 LAB — PROCALCITONIN: PROCALCITONIN: 0.46 ng/mL

## 2016-11-04 LAB — CBC
HCT: 31.8 % — ABNORMAL LOW (ref 36.0–46.0)
HEMATOCRIT: 30.4 % — AB (ref 36.0–46.0)
HEMATOCRIT: 31.3 % — AB (ref 36.0–46.0)
HEMOGLOBIN: 8.9 g/dL — AB (ref 12.0–15.0)
HEMOGLOBIN: 9.4 g/dL — AB (ref 12.0–15.0)
HEMOGLOBIN: 9.6 g/dL — AB (ref 12.0–15.0)
MCH: 24.9 pg — ABNORMAL LOW (ref 26.0–34.0)
MCH: 25.7 pg — ABNORMAL LOW (ref 26.0–34.0)
MCH: 25.8 pg — ABNORMAL LOW (ref 26.0–34.0)
MCHC: 29.3 g/dL — ABNORMAL LOW (ref 30.0–36.0)
MCHC: 30 g/dL (ref 30.0–36.0)
MCHC: 30.2 g/dL (ref 30.0–36.0)
MCV: 84.9 fL (ref 78.0–100.0)
MCV: 85.5 fL (ref 78.0–100.0)
MCV: 85.5 fL (ref 78.0–100.0)
PLATELETS: 223 10*3/uL (ref 150–400)
PLATELETS: 272 10*3/uL (ref 150–400)
Platelets: UNDETERMINED 10*3/uL (ref 150–400)
RBC: 3.58 MIL/uL — AB (ref 3.87–5.11)
RBC: 3.66 MIL/uL — ABNORMAL LOW (ref 3.87–5.11)
RBC: 3.72 MIL/uL — AB (ref 3.87–5.11)
RDW: 17.5 % — AB (ref 11.5–15.5)
RDW: 18 % — ABNORMAL HIGH (ref 11.5–15.5)
RDW: 18.3 % — ABNORMAL HIGH (ref 11.5–15.5)
WBC: 10.8 10*3/uL — AB (ref 4.0–10.5)
WBC: 10.8 10*3/uL — ABNORMAL HIGH (ref 4.0–10.5)
WBC: 9.2 10*3/uL (ref 4.0–10.5)

## 2016-11-04 LAB — I-STAT TROPONIN, ED: TROPONIN I, POC: 0.35 ng/mL — AB (ref 0.00–0.08)

## 2016-11-04 LAB — BRAIN NATRIURETIC PEPTIDE: B NATRIURETIC PEPTIDE 5: 1841.2 pg/mL — AB (ref 0.0–100.0)

## 2016-11-04 LAB — MRSA PCR SCREENING: MRSA BY PCR: NEGATIVE

## 2016-11-04 LAB — HEMOGLOBIN A1C
Hgb A1c MFr Bld: 6.8 % — ABNORMAL HIGH (ref 4.8–5.6)
Mean Plasma Glucose: 148.46 mg/dL

## 2016-11-04 LAB — TROPONIN I: TROPONIN I: 0.29 ng/mL — AB (ref <0.03)

## 2016-11-04 LAB — APTT: APTT: 30 s (ref 24–36)

## 2016-11-04 LAB — TYPE AND SCREEN
ABO/RH(D): B POS
Antibody Screen: NEGATIVE

## 2016-11-04 LAB — LACTIC ACID, PLASMA
Lactic Acid, Venous: 3 mmol/L (ref 0.5–1.9)
Lactic Acid, Venous: 2.7 mmol/L (ref 0.5–1.9)

## 2016-11-04 LAB — ECHOCARDIOGRAM COMPLETE
Height: 65 in
Weight: 2000.01 oz

## 2016-11-04 MED ORDER — DEXTROSE 5 % IV SOLN
1.0000 g | INTRAVENOUS | Status: DC
  Administered 2016-11-04 – 2016-11-08 (×5): 1 g via INTRAVENOUS
  Filled 2016-11-04 (×5): qty 10

## 2016-11-04 MED ORDER — ATORVASTATIN CALCIUM 10 MG PO TABS
10.0000 mg | ORAL_TABLET | Freq: Every day | ORAL | Status: DC
  Administered 2016-11-04 – 2016-11-14 (×12): 10 mg
  Filled 2016-11-04 (×12): qty 1

## 2016-11-04 MED ORDER — DILTIAZEM HCL 100 MG IV SOLR
5.0000 mg/h | INTRAVENOUS | Status: DC
  Administered 2016-11-04: 10 mg/h via INTRAVENOUS
  Administered 2016-11-04: 5 mg/h via INTRAVENOUS
  Filled 2016-11-04 (×3): qty 100

## 2016-11-04 MED ORDER — DEXTROSE-NACL 5-0.45 % IV SOLN: INTRAVENOUS | Status: DC

## 2016-11-04 MED ORDER — HYDRALAZINE HCL 20 MG/ML IJ SOLN: 5.0000 mg | INTRAMUSCULAR | Status: DC | PRN

## 2016-11-04 MED ORDER — AMANTADINE HCL 50 MG/5ML PO SYRP
100.0000 mg | ORAL_SOLUTION | Freq: Two times a day (BID) | ORAL | Status: DC
  Administered 2016-11-04 – 2016-11-15 (×24): 100 mg
  Filled 2016-11-04 (×25): qty 10

## 2016-11-04 MED ORDER — PRO-STAT SUGAR FREE PO LIQD: 30.0000 mL | Freq: Two times a day (BID) | ORAL | Status: DC

## 2016-11-04 MED ORDER — HEPARIN SODIUM (PORCINE) 5000 UNIT/ML IJ SOLN
5000.0000 [IU] | Freq: Three times a day (TID) | INTRAMUSCULAR | Status: DC
  Administered 2016-11-04 – 2016-11-15 (×34): 5000 [IU] via SUBCUTANEOUS
  Filled 2016-11-04 (×33): qty 1

## 2016-11-04 MED ORDER — ACETAMINOPHEN 650 MG RE SUPP: 650.0000 mg | Freq: Four times a day (QID) | RECTAL | Status: DC | PRN

## 2016-11-04 MED ORDER — LORAZEPAM 2 MG/ML IJ SOLN: 1.0000 mg | INTRAMUSCULAR | Status: DC | PRN

## 2016-11-04 MED ORDER — MIRTAZAPINE 15 MG PO TABS
7.5000 mg | ORAL_TABLET | Freq: Every day | ORAL | Status: DC
  Administered 2016-11-04 – 2016-11-14 (×11): 7.5 mg
  Filled 2016-11-04 (×12): qty 1

## 2016-11-04 MED ORDER — PANTOPRAZOLE SODIUM 40 MG IV SOLR
40.0000 mg | Freq: Two times a day (BID) | INTRAVENOUS | Status: DC
  Administered 2016-11-07 – 2016-11-09 (×6): 40 mg via INTRAVENOUS
  Filled 2016-11-04 (×6): qty 40

## 2016-11-04 MED ORDER — ADULT MULTIVITAMIN W/MINERALS CH
1.0000 | ORAL_TABLET | Freq: Every day | ORAL | Status: DC
  Administered 2016-11-04 – 2016-11-15 (×12): 1
  Filled 2016-11-04 (×12): qty 1

## 2016-11-04 MED ORDER — METOPROLOL TARTRATE 50 MG PO TABS
50.0000 mg | ORAL_TABLET | Freq: Two times a day (BID) | ORAL | Status: DC
  Administered 2016-11-04 – 2016-11-09 (×12): 50 mg
  Filled 2016-11-04 (×12): qty 1

## 2016-11-04 MED ORDER — DILTIAZEM 12 MG/ML ORAL SUSPENSION
30.0000 mg | Freq: Four times a day (QID) | ORAL | Status: DC
  Filled 2016-11-04: qty 3

## 2016-11-04 MED ORDER — MORPHINE SULFATE (PF) 4 MG/ML IV SOLN
1.0000 mg | INTRAVENOUS | Status: DC | PRN
  Administered 2016-11-05: 1 mg via INTRAVENOUS
  Filled 2016-11-04: qty 1

## 2016-11-04 MED ORDER — INSULIN ASPART 100 UNIT/ML ~~LOC~~ SOLN
1.0000 [IU] | SUBCUTANEOUS | Status: DC
  Administered 2016-11-04: 2 [IU] via SUBCUTANEOUS
  Administered 2016-11-04: 3 [IU] via SUBCUTANEOUS

## 2016-11-04 MED ORDER — HYDROXYZINE HCL 10 MG PO TABS: 10.0000 mg | ORAL_TABLET | Freq: Three times a day (TID) | ORAL | Status: DC | PRN

## 2016-11-04 MED ORDER — ALLOPURINOL 100 MG PO TABS
100.0000 mg | ORAL_TABLET | Freq: Every day | ORAL | Status: DC
  Administered 2016-11-04 – 2016-11-15 (×12): 100 mg
  Filled 2016-11-04 (×12): qty 1

## 2016-11-04 MED ORDER — ACETAMINOPHEN 325 MG PO TABS: 650.0000 mg | ORAL_TABLET | Freq: Four times a day (QID) | ORAL | Status: DC | PRN

## 2016-11-04 MED ORDER — SODIUM CHLORIDE 0.9 % IV SOLN: 250.0000 mL | INTRAVENOUS | Status: DC | PRN

## 2016-11-04 MED ORDER — SODIUM CHLORIDE 0.9 % IV SOLN
8.0000 mg/h | INTRAVENOUS | Status: DC
  Administered 2016-11-04 (×2): 8 mg/h via INTRAVENOUS
  Filled 2016-11-04 (×2): qty 80

## 2016-11-04 MED ORDER — INSULIN ASPART 100 UNIT/ML ~~LOC~~ SOLN
0.0000 [IU] | Freq: Three times a day (TID) | SUBCUTANEOUS | Status: DC
  Administered 2016-11-04: 5 [IU] via SUBCUTANEOUS
  Filled 2016-11-04: qty 1

## 2016-11-04 MED ORDER — FERROUS SULFATE 300 (60 FE) MG/5ML PO SYRP
300.0000 mg | ORAL_SOLUTION | Freq: Two times a day (BID) | ORAL | Status: DC
  Administered 2016-11-04 – 2016-11-15 (×24): 300 mg
  Filled 2016-11-04 (×25): qty 5

## 2016-11-04 MED ORDER — LEVETIRACETAM 250 MG PO TABS
250.0000 mg | ORAL_TABLET | Freq: Two times a day (BID) | ORAL | Status: DC
  Administered 2016-11-04 – 2016-11-06 (×6): 250 mg via ORAL
  Filled 2016-11-04 (×7): qty 1

## 2016-11-04 MED ORDER — PIPERACILLIN-TAZOBACTAM 3.375 G IVPB: 3.3750 g | Freq: Three times a day (TID) | INTRAVENOUS | Status: DC

## 2016-11-04 MED ORDER — VANCOMYCIN HCL 500 MG IV SOLR: 500.0000 mg | INTRAVENOUS | Status: DC

## 2016-11-04 MED ORDER — DONEPEZIL HCL 10 MG PO TABS
10.0000 mg | ORAL_TABLET | Freq: Every day | ORAL | Status: DC
  Administered 2016-11-04 – 2016-11-14 (×12): 10 mg
  Filled 2016-11-04 (×12): qty 1

## 2016-11-04 MED ORDER — LACTATED RINGERS IV SOLN
INTRAVENOUS | Status: DC
  Administered 2016-11-04 (×2): via INTRAVENOUS
  Administered 2016-11-05: 75 mL/h via INTRAVENOUS

## 2016-11-04 MED ORDER — SODIUM CHLORIDE 0.9 % IV SOLN
80.0000 mg | Freq: Once | INTRAVENOUS | Status: AC
  Administered 2016-11-04: 03:00:00 80 mg via INTRAVENOUS
  Filled 2016-11-04: qty 80

## 2016-11-04 MED ORDER — INSULIN GLARGINE 100 UNIT/ML ~~LOC~~ SOLN
5.0000 [IU] | Freq: Every day | SUBCUTANEOUS | Status: DC
  Administered 2016-11-04 – 2016-11-05 (×2): 5 [IU] via SUBCUTANEOUS
  Filled 2016-11-04 (×3): qty 0.05

## 2016-11-04 MED ORDER — ZOLPIDEM TARTRATE 5 MG PO TABS: 5.0000 mg | ORAL_TABLET | Freq: Every evening | ORAL | Status: DC | PRN

## 2016-11-04 MED ORDER — INFLUENZA VAC SPLIT HIGH-DOSE 0.5 ML IM SUSY
0.5000 mL | PREFILLED_SYRINGE | INTRAMUSCULAR | Status: AC
  Administered 2016-11-05: 0.5 mL via INTRAMUSCULAR
  Filled 2016-11-04: qty 0.5

## 2016-11-04 MED ORDER — ZINC SULFATE 220 (50 ZN) MG PO CAPS
220.0000 mg | ORAL_CAPSULE | Freq: Every day | ORAL | Status: DC
  Administered 2016-11-04 – 2016-11-09 (×7): 220 mg
  Filled 2016-11-04 (×7): qty 1

## 2016-11-04 MED ORDER — DEXTROSE 50 % IV SOLN
INTRAVENOUS | Status: AC
  Administered 2016-11-04: 25 mL
  Filled 2016-11-04: qty 50

## 2016-11-04 NOTE — Progress Notes (Addendum)
Pharmacy Antibiotic Note  Casey [name] is a 76 y.o. female admitted on 11/03/2016 with sepsis and suspected HCAP/UTI.  Pharmacy has been consulted for vancomycin and zosyn dosing. Patient received Aztreonam 2g IV x1 and Levaquin 750mg  IV x1 in the ED.   Tmax 100.3, WBC 10.1, and LA 3.65. SCr 1.76 for estimated CrCl ~ 20-25 mL/min.   Plan: Vancomycin 1g IV x1 in ED, then 500 mg IV q24hr  Zosyn 3.375g IV q8hr  Vancomycin trough at [facility] and as needed (goal 15-20 mcg/mL) Monitor renal function, clinical picture, and culture data Follow renal function closely and dose adjust as needed F/u de-escalation and length of therapy   Height: 5' (152.4 cm) Weight: 125 lb (56.7 kg) IBW/kg (Calculated) : 45.5  Temp (24hrs), Avg:99.9 F (37.7 C), Min:99.5 F (37.5 C), Max:100.3 F (37.9 C)   Recent Labs Lab 10/28/16 1613 10/28/16 2041 10/29/16 0717 10/30/16 0650 11/03/16 2219 11/03/16 2225  WBC 16.7*  --   --  13.4* 10.1  --   CREATININE 1.77*  --   --  1.95* 1.76*  --   LATICACIDVEN  --  2.1* 1.8  --   --  3.65*    Estimated Creatinine Clearance: 21.5 mL/min (A) (by C-G formula based on SCr of 1.76 mg/dL (H)).    Allergies  Allergen Reactions  . Penicillins Other (See Comments)    Told by a doctor to "not take" and on MAR as an allergy Has patient had a PCN reaction causing immediate rash, facial/tongue/throat swelling, SOB or lightheadedness with hypotension: Unknown Has patient had a PCN reaction causing severe rash involving mucus membranes or skin necrosis: Unknown Has patient had a PCN reaction that required hospitalization: Unknown Has patient had a PCN reaction occurring within the last 10 years: Unknown If all of the above answers are "NO", then may proceed with Ceph    Antimicrobials this admission: 10/6 Aztreonam x1 10/6 Levaquin x1 10/6 Vanc >>  10/7 Zosyn >>   Microbiology results: pending   Argie Ramming, PharmD Clinical Pharmacist 11/04/16 12:37 AM

## 2016-11-04 NOTE — H&P (Signed)
PULMONARY / CRITICAL CARE MEDICINE   Name: Casey [name] MRN: [id number] DOB: [birth year]    ADMISSION DATE:  11/03/2016 CONSULTATION DATE:  11/04/2016 REFERRING MD:  ED CHIEF COMPLAINT:  AMS   HISTORY OF PRESENT ILLNESS:   76 year old female history of CVA, dementia, chronic kidney disease, seizures disorder, dementia, chronic systolic CHF, type 2 diabetes, chronic atrial fibrillation with has been rate controlled presents today from long-term care facility with reports that she has been less responsive and has had increased respiratory rate. They report the patient is normally oriented to person but not place. She has been less responsive to them today. In the Ed she was found to have UTI and afib with RVR and started on aztreonam and cardizem drip.   Patient is not able to participate in history. She is awake and alert though.   PAST MEDICAL HISTORY :  She  has a past medical history of Abdominal or pelvic swelling, mass, or lump, left upper quadrant; CHF (congestive heart failure) ([place]); Chronic atrial fibrillation ([place]); Chronic kidney disease; Congestive heart failure, unspecified; Coronary atherosclerosis of unspecified type of vessel, native or graft; Diabetes mellitus; Edema; GERD (gastroesophageal reflux disease); Gout, unspecified; Hypercholesterolemia; Hypertension; Legally blind (03/09/2014); Lymphoma ([place]); Peripheral vascular disease, unspecified ([place]); Seizures ([place]); Sinus of Valsalva aneurysm; Stroke [facility]); Unspecified hereditary and idiopathic peripheral neuropathy; Unspecified urinary incontinence; and Vascular dementia, uncomplicated.  PAST SURGICAL HISTORY: She  has a past surgical history that includes femoral to femoral bypass graft ; Femoral artery debridement (12/06/2011); Femoral-femoral Bypass Graft (12/06/2011); False aneurysm repair (12/06/2011); Femoral-popliteal Bypass Graft (Left, 05/2002); Femoral-popliteal Bypass Graft (Left, 11/2002); Tubal ligation (Bilateral);  Aortogram (09/01/2008); False aneurysm repair (Left, 08/30/2016); Femoral-femoral Bypass Graft (Left, 08/30/2016); Femoral-popliteal Bypass Graft (Left, 08/30/2016); Thrombectomy femoral artery (Left, 08/30/2016); Endarterectomy femoral (Left, 08/30/2016); Application if wound vac (Left, 08/30/2016); and IR GASTROSTOMY TUBE MOD SED (10/18/2016).  Allergies  Allergen Reactions  . Penicillins Other (See Comments)    Told by a doctor to "not take" and on MAR as an allergy Has patient had a PCN reaction causing immediate rash, facial/tongue/throat swelling, SOB or lightheadedness with hypotension: Unknown Has patient had a PCN reaction causing severe rash involving mucus membranes or skin necrosis: Unknown Has patient had a PCN reaction that required hospitalization: Unknown Has patient had a PCN reaction occurring within the last 10 years: Unknown If all of the above answers are "NO", then may proceed with Ceph    No current facility-administered medications on file prior to encounter.    Current Outpatient Prescriptions on File Prior to Encounter  Medication Sig  . allopurinol (ZYLOPRIM) 100 MG tablet Place 100 mg into feeding tube daily.   . Amantadine HCl 100 MG tablet Place 100 mg into feeding tube 2 (two) times daily.  . Amino Acids-Protein Hydrolys (FEEDING SUPPLEMENT, PRO-STAT SUGAR FREE 64,) LIQD Place 30 mLs into feeding tube 2 (two) times daily.  Marland Kitchen atorvastatin (LIPITOR) 10 MG tablet Place 10 mg into feeding tube at bedtime.   Marland Kitchen diltiazem (CARDIZEM) 30 MG tablet Give 1 tablet via G-tube every 6 hours  . donepezil (ARICEPT) 10 MG tablet Place 10 mg into feeding tube at bedtime.  . ferrous sulfate 325 (65 FE) MG tablet Give 1 tablet via G-Tube tow times daily  . insulin glargine (LANTUS) 100 unit/mL SOPN Inject 7 Units into the skin at bedtime.  . insulin lispro (HUMALOG) 100 UNIT/ML injection Inject 5 Units into the skin every 6 (six) hours.  . levETIRAcetam (KEPPRA) 250 MG  tablet Give 1 tablet via  G-Tube two times daily  . levofloxacin (LEVAQUIN) 750 MG/150ML SOLN Inject 750 mg into the vein daily. Until 11/06/16  . metoCLOPramide (REGLAN) 5 MG tablet Place 5 mg into feeding tube every 8 (eight) hours.  . mirtazapine (REMERON) 7.5 MG tablet Place 7.5 mg into feeding tube at bedtime.   . Multiple Vitamins-Minerals (THERA-M) TABS 1 tablet by PEG Tube route daily.  . pantoprazole (PROTONIX) 40 MG tablet Give 1 tablet via G-Tube tow times a day  . zinc sulfate 220 (50 Zn) MG capsule Place 220 mg into feeding tube at bedtime.    FAMILY HISTORY:  Her indicated that her mother is deceased. She indicated that her father is deceased. She indicated that her sister is deceased. She indicated that only one of her three brothers is alive. She indicated that her daughter is alive. She indicated that her son is alive.    SOCIAL HISTORY: She  reports that she quit smoking about 15 years ago. Her smoking use included Cigarettes. She has never used smokeless tobacco. She reports that she does not drink alcohol or use drugs.  REVIEW OF SYSTEMS:   coud not be obtained due to patient AMS   VITAL SIGNS: BP 119/65   Pulse (!) 136   Temp 100.3 F (37.9 C) (Rectal)   Resp (!) 39   Ht 5' (1.524 m)   Wt 56.7 kg (125 lb)   SpO2 100%   BMI 24.41 kg/m   HEMODYNAMICS:  on diltiazem drip    INTAKE / OUTPUT: No intake/output data recorded.  PHYSICAL EXAMINATION: General:  Chronically ill Neuro: alert opening eyes spontaneously mumbles words can not understand, not moving limbs  HEENT: dry  Cardiovascular:  Irregularly irregular  Lungs: clear no wheezing no crackles   Abdomen:  Soft no tenderness  Musculoskeletal:  No edema  Skin: no rash   LABS:  BMET  Recent Labs Lab 10/28/16 1613 10/30/16 0650 11/03/16 2219  NA 138 139 148*  K 4.3 3.8 4.0  CL 98* 97* 102  CO2 29 30 33*  BUN 72* 88* 65*  CREATININE 1.77* 1.95* 1.76*  GLUCOSE 250* 213* 350*    Electrolytes  Recent Labs Lab  10/28/16 1613 10/30/16 0650 11/03/16 2219  CALCIUM 9.8 10.1 10.7*  MG  --  2.3  --   PHOS  --  3.6  --     CBC  Recent Labs Lab 10/28/16 1613 10/30/16 0650 11/03/16 2219  WBC 16.7* 13.4* 10.1  HGB 9.3* 10.1* 9.5*  HCT 31.2* 34.6* 31.7*  PLT 356 300 333    Coag's  Recent Labs Lab 11/03/16 2219  INR 1.32    Sepsis Markers  Recent Labs Lab 10/28/16 2041 10/29/16 0717 11/03/16 2225  LATICACIDVEN 2.1* 1.8 3.65*    ABG  Recent Labs Lab 11/04/16 0018  PHART 7.479*  PCO2ART 43.8  PO2ART 86.0    Liver Enzymes  Recent Labs Lab 10/30/16 0650 11/03/16 2219  AST  --  26  ALT  --  17  ALKPHOS  --  115  BILITOT  --  0.5  ALBUMIN 2.8* 2.8*    Cardiac Enzymes No results for input(s): TROPONINI, PROBNP in the last 168 hours.  Glucose No results for input(s): GLUCAP in the last 168 hours.  Imaging Dg Chest Portable 1 View  Result Date: 11/03/2016 CLINICAL DATA:  Altered mental status, shortness of breath, hypertension, diabetes mellitus, former smoker, CHF, history lymphoma EXAM: PORTABLE CHEST 1 VIEW COMPARISON:  Portable exam 2204 hours compared to 10/29/2016 FINDINGS: Enlargement of cardiac silhouette with mild pulmonary vascular congestion. Atherosclerotic calcification aorta. Mediastinal contours and pulmonary vascularity normal. Persistent atelectasis versus infiltrate in LEFT lower lobe. Minimal perihilar edema. Peribronchial thickening persists. Mild RIGHT basilar atelectasis. No gross pleural effusion or pneumothorax. Bones demineralized. IMPRESSION: Enlargement of cardiac silhouette with pulmonary vascular congestion and question minimal pulmonary edema. RIGHT basilar atelectasis with persistent atelectasis versus consolidation in LEFT lower lobe. Electronically Signed   By: Lavonia Dana M.D.   On: 11/03/2016 22:33     ASSESSMENT / PLAN:  UTI Atrial fibrillation with RVR Sepsis Chronic kidney disease stage III Dementia quadriplegia Metabolic  encephalopathy Hypernatremia Lactic acidosis    Plan: - ceftriaxone - IVF at 30ml/hr - cardizem drip for afib - NPO - follow cultures - DVT prophylaxis  - SSI  - follow urine output and renal function - repeat lactic acid    FAMILY  - Updates: none   - Inter-disciplinary family meet or Palliative Care meeting due by: 11/10/2016    Pulmonary and [place] Pager: [phone]  11/04/2016, 12:44 AM

## 2016-11-04 NOTE — ED Notes (Signed)
Dr Roxanne Mins given a copy of troponin results .[place]

## 2016-11-04 NOTE — Progress Notes (Signed)
[facility] PCCM  S: admitted overnight; briefly this is a 76 y/o female with demential, CKD, systolic CHF, type 2 DM, chronic afib; came in from a SNF on 10/7 early AM with confusion and tachypnea, noted to have a UTI and AFiv, started on aztreonam and cardizem.  Received greater than 7 liters of crystalloid.    O: Vitals:   11/04/16 0715 11/04/16 0730 11/04/16 0800 11/04/16 0813  BP: (!) 83/65 108/70 (!) 122/102   Pulse:   100   Resp: (!) 35 (!) 26 (!) 25   Temp:    98.8 F (37.1 C)  TempSrc:    Oral  SpO2:   99%   Weight:   125 lb (56.7 kg)   Height:   5\' 5"  (1.651 m)    Gen; chronically ill, in bed, no distress HENT: NCAT OP Clear PULM: CTA B CV: Irreg irreg, normal rate GI soft, non tender, belly soft Neuro: awake, confused  U/A : WBC TNTC  BMET    Component Value Date/Time   NA 148 (H) 11/03/2016 2219   NA 141 09/11/2016   K 4.0 11/03/2016 2219   CL 102 11/03/2016 2219   CO2 33 (H) 11/03/2016 2219   GLUCOSE 350 (H) 11/03/2016 2219   BUN 65 (H) 11/03/2016 2219   BUN 16 09/11/2016   CREATININE 1.76 (H) 11/03/2016 2219   CALCIUM 10.7 (H) 11/03/2016 2219   GFRNONAA 27 (L) 11/03/2016 2219   GFRAA 31 (L) 11/03/2016 2219   Lactic acid coming down  Impression/Plan: Afib > rate controlled on cardizem, continue infusion UTI> continue ceftriaxone, f/u urine culture Sepsis> improved, continue IVF, antibiotics Hyperglycemia/DM2 > SSI  Feeding: OK for dietary to start tube feeding Analgesia: n/a Sedation: n/a Thromboprophylaxis: start sub q hep HOB >30 degrees Ulcer prophylaxis: n/a Glucose control: as above  Will ask TRH service to pick up tomorrow  Roselie Awkward, MD [place] [facility] Pager: [phone] Cell: [phone] After 3pm or if no response, call [phone]

## 2016-11-04 NOTE — ED Notes (Signed)
CBG 306. RN notified

## 2016-11-04 NOTE — Progress Notes (Signed)
Hypoglycemic Event  CBG: 58  Treatment: D50 IV 25 mL  Symptoms: None  Follow-up CBG: Time: 2134 CBG Result: 108  Possible Reasons for Event: Unknown  Comments/MD notified: No    Casey [name] F

## 2016-11-04 NOTE — Progress Notes (Signed)
CRITICAL VALUE ALERT  Critical Value:  LA 2.7   Date & Time Notied:  11/04/16 0611  Provider Notified: elink notified  Orders Received/Actions taken: no

## 2016-11-04 NOTE — Progress Notes (Signed)
  Echocardiogram 2D Echocardiogram has been performed.  Samiya Mervin T Quinnlan Abruzzo 11/04/2016, 10:29 AM

## 2016-11-05 ENCOUNTER — Inpatient Hospital Stay (HOSPITAL_COMMUNITY): Payer: Medicare Other

## 2016-11-05 DIAGNOSIS — E785 Hyperlipidemia, unspecified: Secondary | ICD-10-CM

## 2016-11-05 DIAGNOSIS — N183 Chronic kidney disease, stage 3 (moderate): Secondary | ICD-10-CM

## 2016-11-05 DIAGNOSIS — T148XXA Other injury of unspecified body region, initial encounter: Secondary | ICD-10-CM | POA: Insufficient documentation

## 2016-11-05 DIAGNOSIS — E870 Hyperosmolality and hypernatremia: Secondary | ICD-10-CM

## 2016-11-05 DIAGNOSIS — J690 Pneumonitis due to inhalation of food and vomit: Secondary | ICD-10-CM

## 2016-11-05 DIAGNOSIS — E1169 Type 2 diabetes mellitus with other specified complication: Secondary | ICD-10-CM

## 2016-11-05 DIAGNOSIS — E440 Moderate protein-calorie malnutrition: Secondary | ICD-10-CM

## 2016-11-05 DIAGNOSIS — I5022 Chronic systolic (congestive) heart failure: Secondary | ICD-10-CM

## 2016-11-05 DIAGNOSIS — J189 Pneumonia, unspecified organism: Secondary | ICD-10-CM

## 2016-11-05 LAB — URINE CULTURE: CULTURE: NO GROWTH

## 2016-11-05 LAB — BASIC METABOLIC PANEL
ANION GAP: 10 (ref 5–15)
BUN: 51 mg/dL — ABNORMAL HIGH (ref 6–20)
CHLORIDE: 111 mmol/L (ref 101–111)
CO2: 28 mmol/L (ref 22–32)
Calcium: 9.5 mg/dL (ref 8.9–10.3)
Creatinine, Ser: 1.43 mg/dL — ABNORMAL HIGH (ref 0.44–1.00)
GFR calc non Af Amer: 35 mL/min — ABNORMAL LOW (ref >60)
GFR, EST AFRICAN AMERICAN: 40 mL/min — AB (ref >60)
GLUCOSE: 98 mg/dL (ref 65–99)
Potassium: 3.6 mmol/L (ref 3.5–5.1)
Sodium: 149 mmol/L — ABNORMAL HIGH (ref 135–145)

## 2016-11-05 LAB — CBC
HCT: 30.8 % — ABNORMAL LOW (ref 36.0–46.0)
Hemoglobin: 9.4 g/dL — ABNORMAL LOW (ref 12.0–15.0)
MCH: 25.8 pg — AB (ref 26.0–34.0)
MCHC: 30.5 g/dL (ref 30.0–36.0)
MCV: 84.6 fL (ref 78.0–100.0)
PLATELETS: ADEQUATE 10*3/uL (ref 150–400)
RBC: 3.64 MIL/uL — AB (ref 3.87–5.11)
RDW: 18.2 % — AB (ref 11.5–15.5)
WBC: 9.2 10*3/uL (ref 4.0–10.5)

## 2016-11-05 LAB — GLUCOSE, CAPILLARY
GLUCOSE-CAPILLARY: 107 mg/dL — AB (ref 65–99)
GLUCOSE-CAPILLARY: 129 mg/dL — AB (ref 65–99)
GLUCOSE-CAPILLARY: 172 mg/dL — AB (ref 65–99)
Glucose-Capillary: 102 mg/dL — ABNORMAL HIGH (ref 65–99)
Glucose-Capillary: 101 mg/dL — ABNORMAL HIGH (ref 65–99)
Glucose-Capillary: 110 mg/dL — ABNORMAL HIGH (ref 65–99)

## 2016-11-05 MED ORDER — NEPRO/CARBSTEADY PO LIQD
1000.0000 mL | ORAL | Status: DC
  Administered 2016-11-05 – 2016-11-13 (×5): 1000 mL
  Filled 2016-11-05 (×14): qty 1000

## 2016-11-05 MED ORDER — INSULIN ASPART 100 UNIT/ML ~~LOC~~ SOLN
0.0000 [IU] | SUBCUTANEOUS | Status: DC
  Administered 2016-11-05: 1 [IU] via SUBCUTANEOUS
  Administered 2016-11-05 – 2016-11-06 (×2): 2 [IU] via SUBCUTANEOUS
  Administered 2016-11-06: 1 [IU] via SUBCUTANEOUS
  Administered 2016-11-06 (×2): 2 [IU] via SUBCUTANEOUS
  Administered 2016-11-06 – 2016-11-07 (×4): 3 [IU] via SUBCUTANEOUS
  Administered 2016-11-07: 2 [IU] via SUBCUTANEOUS
  Administered 2016-11-07 (×2): 3 [IU] via SUBCUTANEOUS
  Administered 2016-11-08: 2 [IU] via SUBCUTANEOUS
  Administered 2016-11-08: 5 [IU] via SUBCUTANEOUS
  Administered 2016-11-08: 3 [IU] via SUBCUTANEOUS
  Administered 2016-11-08: 5 [IU] via SUBCUTANEOUS
  Administered 2016-11-08 (×2): 2 [IU] via SUBCUTANEOUS
  Administered 2016-11-09 (×3): 3 [IU] via SUBCUTANEOUS
  Administered 2016-11-09: 5 [IU] via SUBCUTANEOUS
  Administered 2016-11-09: 3 [IU] via SUBCUTANEOUS
  Administered 2016-11-09: 7 [IU] via SUBCUTANEOUS
  Administered 2016-11-10: 5 [IU] via SUBCUTANEOUS
  Administered 2016-11-10: 2 [IU] via SUBCUTANEOUS
  Administered 2016-11-10: 3 [IU] via SUBCUTANEOUS
  Administered 2016-11-10: 2 [IU] via SUBCUTANEOUS
  Administered 2016-11-10: 3 [IU] via SUBCUTANEOUS
  Administered 2016-11-10: 2 [IU] via SUBCUTANEOUS
  Administered 2016-11-11: 5 [IU] via SUBCUTANEOUS
  Administered 2016-11-11: 3 [IU] via SUBCUTANEOUS
  Administered 2016-11-11: 2 [IU] via SUBCUTANEOUS
  Administered 2016-11-11: 3 [IU] via SUBCUTANEOUS
  Administered 2016-11-11: 2 [IU] via SUBCUTANEOUS
  Administered 2016-11-11 – 2016-11-12 (×2): 3 [IU] via SUBCUTANEOUS
  Administered 2016-11-12 (×2): 2 [IU] via SUBCUTANEOUS
  Administered 2016-11-12: 3 [IU] via SUBCUTANEOUS
  Administered 2016-11-12: 2 [IU] via SUBCUTANEOUS
  Administered 2016-11-12: 3 [IU] via SUBCUTANEOUS
  Administered 2016-11-13: 2 [IU] via SUBCUTANEOUS
  Administered 2016-11-13: 3 [IU] via SUBCUTANEOUS
  Administered 2016-11-13 (×3): 2 [IU] via SUBCUTANEOUS
  Administered 2016-11-13 – 2016-11-14 (×2): 3 [IU] via SUBCUTANEOUS
  Administered 2016-11-14 (×3): 2 [IU] via SUBCUTANEOUS
  Administered 2016-11-14: 3 [IU] via SUBCUTANEOUS
  Administered 2016-11-15 (×2): 2 [IU] via SUBCUTANEOUS
  Administered 2016-11-15: 3 [IU] via SUBCUTANEOUS
  Administered 2016-11-15: 2 [IU] via SUBCUTANEOUS
  Administered 2016-11-15: 3 [IU] via SUBCUTANEOUS

## 2016-11-05 MED ORDER — PRO-STAT SUGAR FREE PO LIQD
30.0000 mL | Freq: Every day | ORAL | Status: DC
  Administered 2016-11-05 – 2016-11-15 (×11): 30 mL
  Filled 2016-11-05 (×12): qty 30

## 2016-11-05 MED ORDER — ORAL CARE MOUTH RINSE: 15.0000 mL | Freq: Two times a day (BID) | OROMUCOSAL | Status: DC

## 2016-11-05 MED ORDER — ORAL CARE MOUTH RINSE
15.0000 mL | Freq: Two times a day (BID) | OROMUCOSAL | Status: DC
  Administered 2016-11-05 – 2016-11-06 (×4): 15 mL via OROMUCOSAL

## 2016-11-05 MED ORDER — DILTIAZEM 12 MG/ML ORAL SUSPENSION
30.0000 mg | Freq: Four times a day (QID) | ORAL | Status: DC
  Administered 2016-11-05 – 2016-11-10 (×21): 30 mg
  Filled 2016-11-05 (×23): qty 3

## 2016-11-05 MED ORDER — METRONIDAZOLE 50 MG/ML ORAL SUSPENSION
500.0000 mg | Freq: Every day | ORAL | Status: DC
  Administered 2016-11-05 – 2016-11-07 (×3): 500 mg
  Filled 2016-11-05 (×3): qty 10

## 2016-11-05 MED ORDER — CHLORHEXIDINE GLUCONATE 0.12 % MT SOLN
15.0000 mL | Freq: Two times a day (BID) | OROMUCOSAL | Status: DC
  Administered 2016-11-05 – 2016-11-07 (×5): 15 mL via OROMUCOSAL
  Filled 2016-11-05 (×3): qty 15

## 2016-11-05 NOTE — Progress Notes (Signed)
Initial Nutrition Assessment  DOCUMENTATION CODES:   Non-severe (moderate) malnutrition in context of chronic illness  INTERVENTION:   Nepro at goal rate of 13ml/hr  Add Protstat 30 ml daily  Regimen provides 1828kcal/day, 93g/day protein, 644ml/day free water   MVI  NUTRITION DIAGNOSIS:   Malnutrition (moderate) related to chronic illness (dementia, CVA, CKD, CHF) as evidenced by moderate depletion of body fat in arms, severe depletion of muscle mass in clavicles and temporal regions, moderate muscle depletions in BLE.  GOAL:   Patient will meet greater than or equal to 90% of their needs  MONITOR:   Labs, Weight trends, TF tolerance, I & O's  REASON FOR ASSESSMENT:   Consult Enteral/tube feeding initiation and management  ASSESSMENT:   76 year old female history of CVA, dementia, chronic kidney disease, seizures disorder, dementia, chronic systolic CHF, type 2 diabetes, chronic atrial fibrillation with has been rate controlled presents today from long-term care facility with reports that she has been less responsive and has had increased respiratory rate.    Unable to obtain nutrition related history as pt with dementia. Per chart, pt s/p CVA and noted to have esophageal dysmotility. Pt had PEG tube placed 9/18 and has been receiving Nepro at 33ml/hr at [facility]. Per chart, pt with weight gain after PEG placement.   Medications reviewed and include: Allopurinol, ferrous sulfate, heparin, insulin, mirtazapine, MVI, protonix, zinc, ceftriaxone   Labs reviewed: Na 149(H), BUN 51(H), creat 1.43(H) BNP- 1841(H)- 10/7 Hgb 9.4(L), Hct 30.8(L)  Nutrition-Focused physical exam completed. Findings are moderate fat depletions in upper arms, moderate muscle depletions in BLE, severe muscle depletions is temporal regions and clavicles, and mild edema in UE.   Diet Order:  Diet NPO time specified Except for: Sips with Meds  Skin:  Wound (see comment) (deep tissue injury heel)  Last  BM:  10/8- type 7  Height:   Ht Readings from Last 1 Encounters:  11/05/16 5\' 5"  (1.651 m)    Weight:   Wt Readings from Last 1 Encounters:  11/05/16 129 lb 6.6 oz (58.7 kg)    Ideal Body Weight:  56.8 kg  BMI:  Body mass index is 21.53 kg/m.  Estimated Nutritional Needs:   Kcal:  1500-1700kcal/day   Protein:  88-99g/day   Fluid:  >1.5L or per MD  EDUCATION NEEDS:   Education needs no appropriate at this time  Koleen Distance MS, RD, [place] Pager #[phone] After Hours Pager: [phone]

## 2016-11-05 NOTE — Progress Notes (Signed)
SLP Cancellation Note  Patient Details Name: Casey [name] MRN: [id number] DOB: [birth year]   Cancelled treatment:       Reason Eval/Treat Not Completed: Medical issues which prohibited therapy. Pt has a history of severe esophageal dysmotility resulting in high risk of aspiration and PEG tube placement while on select in September. Pt may be appropriate for SLP reassessment at some time in the future if physical deconditioning or plan of care changes. At this time initiating swallow therapy for a severe esophageal dysmotility would be futile. Intervention would only be appropriate with family acceptance of risk of aspiration and the potential pulmonary complications of PO intake. Will sign off for now.   Herbie [place], [place] CCC-SLP [phone]  Lynann Beaver 11/05/2016, 10:48 AM

## 2016-11-05 NOTE — Care Management Note (Signed)
Case Management Note Marvetta Gibbons RN, BSN Unit 4E-Case Manager- [place] coverage [phone]  Patient Details  Name: Casey [name] MRN: [id number] Date of Birth: 29-Nov-1940  Subjective/Objective:  Pt admitted from a SNF on 10/7 with confusion and tachypnea, noted to have a UTI and AFib- started on IV abx and IV Cardizem.                 Action/Plan: PTA pt lived at [facility] consulted for return to SNF when medically stable.   Expected Discharge Date:  11/06/16               Expected Discharge Plan:  Skilled Nursing Facility  In-House Referral:  Clinical Social Work  Discharge planning Services  CM Consult  Post Acute Care Choice:    Choice offered to:     DME Arranged:    DME Agency:     HH Arranged:    [place] Agency:     Status of Service:  In process, will continue to follow  If discussed at Long Length of Stay Meetings, dates discussed:    Discharge Disposition: skilled facility   Additional Comments:  Dawayne Patricia, RN 11/05/2016, 10:48 AM

## 2016-11-05 NOTE — Progress Notes (Signed)
PROGRESS NOTE    Casey [name]  ZOX:[id number] DOB: [birth year] DOA: 11/03/2016 PCP: Gildardo Cranker, DO   Brief Narrative: Casey [name] is a 76 y.o. female history of CVA, dementia, chronic kidney disease, seizures disorder, dementia, chronic systolic CHF, type 2 diabetes, chronic atrial fibrillation. She presented with altered mental status with concern for a UTI and had associated atrial fibrillation with RVR. She is on empiric antibiotics.   Assessment & Plan:   Principal Problem:   Sepsis ([place]) Active Problems:   Dementia without behavioral disturbance   Chronic systolic congestive heart failure (HCC)   Seizures (HCC)   Dyslipidemia associated with type 2 diabetes mellitus (HCC)   Type 2 diabetes mellitus with diabetic neuropathy, without long-term current use of insulin (HCC)   Atrial fibrillation with rapid ventricular response (HCC)   Chronic kidney disease (CKD), stage III (moderate) (HCC)   HCAP (healthcare-associated pneumonia)   Esophageal dysphagia   Anemia due to gastrointestinal blood loss   Hypernatremia   UTI (urinary tract infection)   Hypercalcemia   Aspiration pneumonia (HCC)   Acute metabolic encephalopathy   Elevated troponin   Atrial fibrillation with RVR (HCC)   Aspiration pneumonia ?UTI Chest x-ray significant for atelectasis vs pneumonia.  -continue ceftriaxone -blood/urine culture pending -continue ceftriaxone -add metronidazole   Moderate malnutrition PEG dependent Dysphagia -nutrition consult for tube feed  Atrial fibrillation with RVR Resolved on cardizem drip -restart Cardizem PO  Acute metabolic encephalopathy Secondary to infection. Appears to be at baseline.  CKD stage III Stable  Hypernatremia Likely secondary to poor free water intake. S/p IV fluids -if no improvement, may need to increase free water in diet  Diabetes mellitus, type 2 -SSI q4 hours -Hold Lantus, restart pending CBGs   DVT prophylaxis:  Heparin Code Status: Full code Family Communication: Daughter on telephone Disposition Plan: Discharge pending medical improvement   Consultants:   PCCM  Procedures:   None  Antimicrobials:  Ceftriaxone    Subjective: No pain per patient.  Objective: Vitals:   11/05/16 0500 11/05/16 0600 11/05/16 0700 11/05/16 0800  BP: 98/62 119/69 (!) 128/92 119/82  Pulse: (!) 104 86 91 96  Resp: 19 (!) 23 (!) 27 (!) 31  Temp:      TempSrc:      SpO2: 90% 98% 99% 97%  Weight:      Height:        Intake/Output Summary (Last 24 hours) at 11/05/16 0907 Last data filed at 11/05/16 0800  Gross per 24 hour  Intake             2215 ml  Output              150 ml  Net             2065 ml   Filed Weights   11/03/16 2241 11/04/16 0800 11/05/16 0400  Weight: 56.7 kg (125 lb) 56.7 kg (125 lb) 58.7 kg (129 lb 6.6 oz)    Examination:  General exam: Appears calm and comfortable.  Respiratory system: Clear to auscultation. Respiratory effort normal. Cardiovascular system: S1 & S2 heard, RRR. No murmurs. Gastrointestinal system: Abdomen is nondistended, soft and nontender. Normal bowel sounds heard. Central nervous system: Alert and oriented to person. Right hemiparesis Extremities: No edema. No calf tenderness Skin: No cyanosis. No rashes Psychiatry: Judgement and insight appear impaired.    Data Reviewed: I have personally reviewed following labs and imaging studies  CBC:  Recent Labs Lab 11/03/16 2219 11/04/16 0025  11/04/16 1617 11/04/16 1930 11/05/16 0227  WBC 10.1 10.8* 10.8* 9.2 9.2  NEUTROABS 8.8*  --   --   --   --   HGB 9.5* 8.9* 9.4* 9.6* 9.4*  HCT 31.7* 30.4* 31.3* 31.8* 30.8*  MCV 84.8 84.9 85.5 85.5 84.6  PLT 333 272 PLATELET CLUMPS NOTED ON SMEAR, UNABLE TO ESTIMATE 223 PLATELET CLUMPS NOTED ON SMEAR, COUNT APPEARS ADEQUATE   Basic Metabolic Panel:  Recent Labs Lab 10/30/16 0650 11/03/16 2219 11/04/16 1617 11/05/16 0227  NA 139 148* 149* 149*  K  3.8 4.0 3.7 3.6  CL 97* 102 113* 111  CO2 30 33* 27 28  GLUCOSE 213* 350* 104* 98  BUN 88* 65* 55* 51*  CREATININE 1.95* 1.76* 1.49* 1.43*  CALCIUM 10.1 10.7* 9.4 9.5  MG 2.3  --   --   --   PHOS 3.6  --   --   --    GFR: Estimated Creatinine Clearance: 30.1 mL/min (A) (by C-G formula based on SCr of 1.43 mg/dL (H)). Liver Function Tests:  Recent Labs Lab 10/30/16 0650 11/03/16 2219  AST  --  26  ALT  --  17  ALKPHOS  --  115  BILITOT  --  0.5  PROT  --  7.9  ALBUMIN 2.8* 2.8*   No results for input(s): LIPASE, AMYLASE in the last 168 hours. No results for input(s): AMMONIA in the last 168 hours. Coagulation Profile:  Recent Labs Lab 11/03/16 2219  INR 1.32   Cardiac Enzymes:  Recent Labs Lab 11/04/16 0217  TROPONINI 0.29*   BNP (last 3 results) No results for input(s): PROBNP in the last 8760 hours. HbA1C:  Recent Labs  11/04/16 0413  HGBA1C 6.8*   CBG:  Recent Labs Lab 11/04/16 2042 11/04/16 2134 11/05/16 0002 11/05/16 0347 11/05/16 0820  GLUCAP 58* 108* 107* 102* 101*   Lipid Profile:  Recent Labs  11/04/16 0413  CHOL 94  HDL 46  LDLCALC 34  TRIG 69  CHOLHDL 2.0   Thyroid Function Tests: No results for input(s): TSH, T4TOTAL, FREET4, T3FREE, THYROIDAB in the last 72 hours. Anemia Panel: No results for input(s): VITAMINB12, FOLATE, FERRITIN, TIBC, IRON, RETICCTPCT in the last 72 hours. Sepsis Labs:  Recent Labs Lab 11/03/16 2225 11/04/16 0025 11/04/16 0413  PROCALCITON  --  0.46  --   LATICACIDVEN 3.65* 3.0* 2.7*    Recent Results (from the past 240 hour(s))  Culture, Urine     Status: Abnormal   Collection Time: 10/28/16  2:45 AM  Result Value Ref Range Status   Specimen Description URINE, RANDOM  Final   Special Requests NONE  Final   Culture MULTIPLE SPECIES PRESENT, SUGGEST RECOLLECTION (A)  Final   Report Status 10/30/2016 FINAL  Final  Culture, respiratory (NON-Expectorated)     Status: None   Collection Time:  10/28/16  6:18 PM  Result Value Ref Range Status   Specimen Description TRACHEAL ASPIRATE  Final   Special Requests NONE  Final   Gram Stain   Final    DEGENERATED CELLULAR MATERIAL PRESENT MODERATE GRAM POSITIVE COCCI IN CLUSTERS IN PAIRS FEW GRAM NEGATIVE RODS FEW GRAM POSITIVE RODS    Culture FEW Consistent with normal respiratory flora.  Final   Report Status 10/31/2016 FINAL  Final  Culture, blood (routine x 2)     Status: None   Collection Time: 10/28/16  8:41 PM  Result Value Ref Range Status   Specimen Description BLOOD RIGHT HAND  Final  Special Requests   Final    BOTTLES DRAWN AEROBIC AND ANAEROBIC Blood Culture adequate volume   Culture NO GROWTH 5 DAYS  Final   Report Status 11/02/2016 FINAL  Final  Culture, blood (routine x 2)     Status: None   Collection Time: 10/28/16  8:49 PM  Result Value Ref Range Status   Specimen Description BLOOD RIGHT HAND  Final   Special Requests   Final    BOTTLES DRAWN AEROBIC AND ANAEROBIC Blood Culture adequate volume   Culture NO GROWTH 5 DAYS  Final   Report Status 11/02/2016 FINAL  Final  Culture, blood (Routine x 2)     Status: None (Preliminary result)   Collection Time: 11/03/16 10:01 PM  Result Value Ref Range Status   Specimen Description BLOOD RIGHT ANTECUBITAL  Final   Special Requests IN PEDIATRIC BOTTLE Blood Culture adequate volume  Final   Culture NO GROWTH < 24 HOURS  Final   Report Status PENDING  Incomplete  Culture, blood (Routine x 2)     Status: None (Preliminary result)   Collection Time: 11/03/16 10:10 PM  Result Value Ref Range Status   Specimen Description BLOOD RIGHT HAND  Final   Special Requests IN PEDIATRIC BOTTLE Blood Culture adequate volume  Final   Culture NO GROWTH < 24 HOURS  Final   Report Status PENDING  Incomplete  MRSA PCR Screening     Status: None   Collection Time: 11/04/16  4:40 AM  Result Value Ref Range Status   MRSA by PCR NEGATIVE NEGATIVE Final    Comment:        The GeneXpert  MRSA Assay (FDA approved for NASAL specimens only), is one component of a comprehensive MRSA colonization surveillance program. It is not intended to diagnose MRSA infection nor to guide or monitor treatment for MRSA infections.          Radiology Studies: Dg Chest Port 1 View  Result Date: 11/05/2016 CLINICAL DATA:  Tachypnea.  History of CHF, former smoker, CLL EXAM: PORTABLE CHEST 1 VIEW COMPARISON:  Chest x-ray of November 03, 2016 FINDINGS: The lungs are well-expanded. There are small bilateral pleural effusions. The retrocardiac region on the left remains dense. The cardiac silhouette remains enlarged. The pulmonary vascularity is engorged centrally. There is bilateral hilar prominence. There is calcification in the wall of the thoracic aorta. The bony thorax exhibits no acute abnormality. IMPRESSION: CHF superimposed upon COPD and smoking related changes. Left lower lobe atelectasis or pneumonia. Small bilateral pleural effusions. Thoracic aortic atherosclerosis. Electronically Signed   By: David  Martinique M.D.   On: 11/05/2016 08:41   Dg Chest Portable 1 View  Result Date: 11/03/2016 CLINICAL DATA:  Altered mental status, shortness of breath, hypertension, diabetes mellitus, former smoker, CHF, history lymphoma EXAM: PORTABLE CHEST 1 VIEW COMPARISON:  Portable exam 2204 hours compared to 10/29/2016 FINDINGS: Enlargement of cardiac silhouette with mild pulmonary vascular congestion. Atherosclerotic calcification aorta. Mediastinal contours and pulmonary vascularity normal. Persistent atelectasis versus infiltrate in LEFT lower lobe. Minimal perihilar edema. Peribronchial thickening persists. Mild RIGHT basilar atelectasis. No gross pleural effusion or pneumothorax. Bones demineralized. IMPRESSION: Enlargement of cardiac silhouette with pulmonary vascular congestion and question minimal pulmonary edema. RIGHT basilar atelectasis with persistent atelectasis versus consolidation in LEFT lower  lobe. Electronically Signed   By: Lavonia Dana M.D.   On: 11/03/2016 22:33        Scheduled Meds: . allopurinol  100 mg Per Tube Daily  . amantadine  100  mg Per Tube BID  . atorvastatin  10 mg Per Tube QHS  . diltiazem  30 mg Per Tube Q6H  . donepezil  10 mg Per Tube QHS  . ferrous sulfate  300 mg Per Tube BID WC  . heparin subcutaneous  5,000 Units Subcutaneous Q8H  . Influenza vac split quadrivalent PF  0.5 mL Intramuscular Tomorrow-1000  . insulin aspart  0-9 Units Subcutaneous Q4H  . insulin glargine  5 Units Subcutaneous QHS  . levETIRAcetam  250 mg Oral BID  . metoprolol tartrate  50 mg Per Tube BID  . mirtazapine  7.5 mg Per Tube QHS  . multivitamin with minerals  1 tablet Per Tube Daily  . [START ON 11/07/2016] pantoprazole  40 mg Intravenous Q12H  . zinc sulfate  220 mg Per Tube QHS   Continuous Infusions: . sodium chloride    . cefTRIAXone (ROCEPHIN)  IV Stopped (11/05/16 0430)     LOS: 1 day     Cordelia Poche, MD [facility] 11/05/2016, 9:07 AM Pager: [phone]  If 7PM-7AM, please contact night-coverage [email or web address] Password [email or web address] 11/05/2016, 9:07 AM

## 2016-11-05 NOTE — Consult Note (Signed)
[facility] Nurse wound consult note Reason for Consult: Consult requested for bilat heels.   Wound type: Deep tissue injuries to bilat heels: Left heel 3X5cm, Right heel 5X7cm, Right inner foot .5X.5cm, Right anterior foot 1X1cm Pressure Injury POA: Yes Wound bed: All darker-colored intact skin Dressing procedure/placement/frequency: Prevalon beets in place to reduce pressure and protect from further injury.  No family members present to discuss plan of care. Please re-consult if further assistance is needed.  Thank-you,  Julien Girt MSN, [place], [place], [place], [place]

## 2016-11-06 DIAGNOSIS — F0150 Vascular dementia without behavioral disturbance: Secondary | ICD-10-CM

## 2016-11-06 LAB — GLUCOSE, CAPILLARY
GLUCOSE-CAPILLARY: 156 mg/dL — AB (ref 65–99)
GLUCOSE-CAPILLARY: 188 mg/dL — AB (ref 65–99)
GLUCOSE-CAPILLARY: 196 mg/dL — AB (ref 65–99)
GLUCOSE-CAPILLARY: 223 mg/dL — AB (ref 65–99)
Glucose-Capillary: 220 mg/dL — ABNORMAL HIGH (ref 65–99)
Glucose-Capillary: 130 mg/dL — ABNORMAL HIGH (ref 65–99)

## 2016-11-06 LAB — BASIC METABOLIC PANEL
Anion gap: 10 (ref 5–15)
BUN: 63 mg/dL — AB (ref 6–20)
CALCIUM: 9.7 mg/dL (ref 8.9–10.3)
CO2: 28 mmol/L (ref 22–32)
CREATININE: 2.02 mg/dL — AB (ref 0.44–1.00)
Chloride: 109 mmol/L (ref 101–111)
GFR calc non Af Amer: 23 mL/min — ABNORMAL LOW (ref >60)
GFR, EST AFRICAN AMERICAN: 26 mL/min — AB (ref >60)
Glucose, Bld: 213 mg/dL — ABNORMAL HIGH (ref 65–99)
Potassium: 3.5 mmol/L (ref 3.5–5.1)
SODIUM: 147 mmol/L — AB (ref 135–145)

## 2016-11-06 MED ORDER — LEVETIRACETAM 100 MG/ML PO SOLN
250.0000 mg | Freq: Two times a day (BID) | ORAL | Status: DC
  Administered 2016-11-06 – 2016-11-15 (×18): 250 mg via ORAL
  Filled 2016-11-06 (×18): qty 5

## 2016-11-06 MED ORDER — INSULIN GLARGINE 100 UNIT/ML ~~LOC~~ SOLN
10.0000 [IU] | Freq: Every day | SUBCUTANEOUS | Status: DC
  Administered 2016-11-06 – 2016-11-09 (×4): 10 [IU] via SUBCUTANEOUS
  Filled 2016-11-06 (×4): qty 0.1

## 2016-11-06 NOTE — Progress Notes (Signed)
Pt arrive to the unit via bed at 1440.

## 2016-11-06 NOTE — Progress Notes (Signed)
Report called at this time to RN on 5W

## 2016-11-06 NOTE — Progress Notes (Signed)
PROGRESS NOTE    Casey [name]  UUV:[id number] DOB: Dec 15, 1940 DOA: 11/03/2016 PCP: Gildardo Cranker, DO   Brief Narrative: Casey [name] is a 76 y.o. female history of CVA, dementia, chronic kidney disease, seizures disorder, dementia, chronic systolic CHF, type 2 diabetes, chronic atrial fibrillation. She presented with altered mental status with concern for a UTI and had associated atrial fibrillation with RVR. She is on empiric antibiotics.   Assessment & Plan:   Principal Problem:   Sepsis ([place]) Active Problems:   Dementia without behavioral disturbance   Chronic systolic congestive heart failure (HCC)   Seizures (HCC)   Dyslipidemia associated with type 2 diabetes mellitus (HCC)   Type 2 diabetes mellitus with diabetic neuropathy, without long-term current use of insulin (HCC)   Atrial fibrillation with rapid ventricular response (HCC)   Chronic kidney disease (CKD), stage III (moderate) (HCC)   HCAP (healthcare-associated pneumonia)   Esophageal dysphagia   Anemia due to gastrointestinal blood loss   Hypernatremia   UTI (urinary tract infection)   Hypercalcemia   Aspiration pneumonia (HCC)   Acute metabolic encephalopathy   Elevated troponin   Atrial fibrillation with RVR (HCC)   Pressure injury of skin   Malnutrition of moderate degree   Aspiration pneumonia Chest x-ray significant for atelectasis vs pneumonia. Urine culture negative for bacteria. -blood culture pending (no growth x2 days) -continue ceftriaxone -continue metronidazole   Moderate malnutrition PEG dependent Dysphagia Nepro 60ml/hr continuous  Atrial fibrillation with RVR Resolved on cardizem drip -continue Cardizem PO  Acute metabolic encephalopathy Secondary to infection. Appears to be at baseline.  CKD stage III Stable  Hypernatremia Likely secondary to poor free water intake. S/p IV fluids -if no improvement, may need to increase free water in diet -BMP pending this  morning  Diabetes mellitus, type 2 -SSI q4 hours -increase to Lantus 10 units   DVT prophylaxis: Heparin Code Status: Full code Family Communication: Daughter on telephone Disposition Plan: Discharge to SNF   Consultants:   PCCM  Procedures:   None  Antimicrobials:  Ceftriaxone    Subjective: No concerns. Patient rude to me today.  Objective: Vitals:   11/06/16 0600 11/06/16 0700 11/06/16 0754 11/06/16 0800  BP: (!) 97/55   (!) 113/57  Pulse: 90 91  92  Resp: (!) 41 (!) 35  (!) 39  Temp:   98.1 F (36.7 C)   TempSrc:   Oral   SpO2: 99% 100%  99%  Weight:      Height:        Intake/Output Summary (Last 24 hours) at 11/06/16 0920 Last data filed at 11/06/16 0800  Gross per 24 hour  Intake             1080 ml  Output              470 ml  Net              610 ml   Filed Weights   11/04/16 0800 11/05/16 0400 11/06/16 0300  Weight: 56.7 kg (125 lb) 58.7 kg (129 lb 6.6 oz) 57.9 kg (127 lb 10.3 oz)    Examination:  General exam: Appears calm and comfortable.  Respiratory system: Clear to auscultation. Respiratory effort normal. Cardiovascular system: S1 & S2 heard, RRR. No murmurs. Gastrointestinal system: Abdomen is nondistended, soft and nontender. Normal bowel sounds heard. Central nervous system: Alert and oriented to person and place. Right hemiparesis Extremities: No edema. No calf tenderness Skin: No cyanosis. No rashes Psychiatry:  Judgement and insight appear impaired.    Data Reviewed: I have personally reviewed following labs and imaging studies  CBC:  Recent Labs Lab 11/03/16 2219 11/04/16 0025 11/04/16 1617 11/04/16 1930 11/05/16 0227  WBC 10.1 10.8* 10.8* 9.2 9.2  NEUTROABS 8.8*  --   --   --   --   HGB 9.5* 8.9* 9.4* 9.6* 9.4*  HCT 31.7* 30.4* 31.3* 31.8* 30.8*  MCV 84.8 84.9 85.5 85.5 84.6  PLT 333 272 PLATELET CLUMPS NOTED ON SMEAR, UNABLE TO ESTIMATE 223 PLATELET CLUMPS NOTED ON SMEAR, COUNT APPEARS ADEQUATE   Basic  Metabolic Panel:  Recent Labs Lab 11/03/16 2219 11/04/16 1617 11/05/16 0227  NA 148* 149* 149*  K 4.0 3.7 3.6  CL 102 113* 111  CO2 33* 27 28  GLUCOSE 350* 104* 98  BUN 65* 55* 51*  CREATININE 1.76* 1.49* 1.43*  CALCIUM 10.7* 9.4 9.5   GFR: Estimated Creatinine Clearance: 30.1 mL/min (A) (by C-G formula based on SCr of 1.43 mg/dL (H)). Liver Function Tests:  Recent Labs Lab 11/03/16 2219  AST 26  ALT 17  ALKPHOS 115  BILITOT 0.5  PROT 7.9  ALBUMIN 2.8*   No results for input(s): LIPASE, AMYLASE in the last 168 hours. No results for input(s): AMMONIA in the last 168 hours. Coagulation Profile:  Recent Labs Lab 11/03/16 2219  INR 1.32   Cardiac Enzymes:  Recent Labs Lab 11/04/16 0217  TROPONINI 0.29*   BNP (last 3 results) No results for input(s): PROBNP in the last 8760 hours. HbA1C:  Recent Labs  11/04/16 0413  HGBA1C 6.8*   CBG:  Recent Labs Lab 11/05/16 1556 11/05/16 1945 11/06/16 0002 11/06/16 0333 11/06/16 0752  GLUCAP 129* 172* 188* 196* 223*   Lipid Profile:  Recent Labs  11/04/16 0413  CHOL 94  HDL 46  LDLCALC 34  TRIG 69  CHOLHDL 2.0   Thyroid Function Tests: No results for input(s): TSH, T4TOTAL, FREET4, T3FREE, THYROIDAB in the last 72 hours. Anemia Panel: No results for input(s): VITAMINB12, FOLATE, FERRITIN, TIBC, IRON, RETICCTPCT in the last 72 hours. Sepsis Labs:  Recent Labs Lab 11/03/16 2225 11/04/16 0025 11/04/16 0413  PROCALCITON  --  0.46  --   LATICACIDVEN 3.65* 3.0* 2.7*    Recent Results (from the past 240 hour(s))  Culture, Urine     Status: Abnormal   Collection Time: 10/28/16  2:45 AM  Result Value Ref Range Status   Specimen Description URINE, RANDOM  Final   Special Requests NONE  Final   Culture MULTIPLE SPECIES PRESENT, SUGGEST RECOLLECTION (A)  Final   Report Status 10/30/2016 FINAL  Final  Culture, respiratory (NON-Expectorated)     Status: None   Collection Time: 10/28/16  6:18 PM   Result Value Ref Range Status   Specimen Description TRACHEAL ASPIRATE  Final   Special Requests NONE  Final   Gram Stain   Final    DEGENERATED CELLULAR MATERIAL PRESENT MODERATE GRAM POSITIVE COCCI IN CLUSTERS IN PAIRS FEW GRAM NEGATIVE RODS FEW GRAM POSITIVE RODS    Culture FEW Consistent with normal respiratory flora.  Final   Report Status 10/31/2016 FINAL  Final  Culture, blood (routine x 2)     Status: None   Collection Time: 10/28/16  8:41 PM  Result Value Ref Range Status   Specimen Description BLOOD RIGHT HAND  Final   Special Requests   Final    BOTTLES DRAWN AEROBIC AND ANAEROBIC Blood Culture adequate volume   Culture NO  GROWTH 5 DAYS  Final   Report Status 11/02/2016 FINAL  Final  Culture, blood (routine x 2)     Status: None   Collection Time: 10/28/16  8:49 PM  Result Value Ref Range Status   Specimen Description BLOOD RIGHT HAND  Final   Special Requests   Final    BOTTLES DRAWN AEROBIC AND ANAEROBIC Blood Culture adequate volume   Culture NO GROWTH 5 DAYS  Final   Report Status 11/02/2016 FINAL  Final  Culture, blood (Routine x 2)     Status: None (Preliminary result)   Collection Time: 11/03/16 10:01 PM  Result Value Ref Range Status   Specimen Description BLOOD RIGHT ANTECUBITAL  Final   Special Requests IN PEDIATRIC BOTTLE Blood Culture adequate volume  Final   Culture NO GROWTH 2 DAYS  Final   Report Status PENDING  Incomplete  Culture, blood (Routine x 2)     Status: None (Preliminary result)   Collection Time: 11/03/16 10:10 PM  Result Value Ref Range Status   Specimen Description BLOOD RIGHT HAND  Final   Special Requests IN PEDIATRIC BOTTLE Blood Culture adequate volume  Final   Culture NO GROWTH 2 DAYS  Final   Report Status PENDING  Incomplete  Urine Culture     Status: None   Collection Time: 11/03/16 10:36 PM  Result Value Ref Range Status   Specimen Description URINE, CATHETERIZED  Final   Special Requests NONE  Final   Culture NO GROWTH   Final   Report Status 11/05/2016 FINAL  Final  MRSA PCR Screening     Status: None   Collection Time: 11/04/16  4:40 AM  Result Value Ref Range Status   MRSA by PCR NEGATIVE NEGATIVE Final    Comment:        The GeneXpert MRSA Assay (FDA approved for NASAL specimens only), is one component of a comprehensive MRSA colonization surveillance program. It is not intended to diagnose MRSA infection nor to guide or monitor treatment for MRSA infections.          Radiology Studies: Dg Chest Port 1 View  Result Date: 11/05/2016 CLINICAL DATA:  Tachypnea.  History of CHF, former smoker, CLL EXAM: PORTABLE CHEST 1 VIEW COMPARISON:  Chest x-ray of November 03, 2016 FINDINGS: The lungs are well-expanded. There are small bilateral pleural effusions. The retrocardiac region on the left remains dense. The cardiac silhouette remains enlarged. The pulmonary vascularity is engorged centrally. There is bilateral hilar prominence. There is calcification in the wall of the thoracic aorta. The bony thorax exhibits no acute abnormality. IMPRESSION: CHF superimposed upon COPD and smoking related changes. Left lower lobe atelectasis or pneumonia. Small bilateral pleural effusions. Thoracic aortic atherosclerosis. Electronically Signed   By: David  Martinique M.D.   On: 11/05/2016 08:41        Scheduled Meds: . allopurinol  100 mg Per Tube Daily  . amantadine  100 mg Per Tube BID  . atorvastatin  10 mg Per Tube QHS  . chlorhexidine  15 mL Mouth Rinse BID  . diltiazem  30 mg Per Tube Q6H  . donepezil  10 mg Per Tube QHS  . feeding supplement (PRO-STAT SUGAR FREE 64)  30 mL Per Tube Daily  . ferrous sulfate  300 mg Per Tube BID WC  . heparin subcutaneous  5,000 Units Subcutaneous Q8H  . insulin aspart  0-9 Units Subcutaneous Q4H  . insulin glargine  10 Units Subcutaneous QHS  . levETIRAcetam  250 mg Oral  BID  . mouth rinse  15 mL Mouth Rinse q12n4p  . metoprolol tartrate  50 mg Per Tube BID  .  metroNIDAZOLE  500 mg Per Tube Daily  . mirtazapine  7.5 mg Per Tube QHS  . multivitamin with minerals  1 tablet Per Tube Daily  . [START ON 11/07/2016] pantoprazole  40 mg Intravenous Q12H  . zinc sulfate  220 mg Per Tube QHS   Continuous Infusions: . sodium chloride    . cefTRIAXone (ROCEPHIN)  IV Stopped (11/06/16 0430)  . feeding supplement (NEPRO CARB STEADY) 1,000 mL (11/06/16 0800)     LOS: 2 days     Cordelia Poche, MD [facility] 11/06/2016, 9:20 AM Pager: [phone]  If 7PM-7AM, please contact night-coverage [email or web address] Password [email or web address] 11/06/2016, 9:20 AM

## 2016-11-06 NOTE — Progress Notes (Signed)
Casey [name] is a 76 y.o. female patient is a transfer from [facility] oriented to self upon arrival to unit - no acute distress noted.  VSS - Blood pressure (!) 110/43, pulse 87, temperature 98.5 F (36.9 C), temperature source Axillary, resp. rate (!) 33, height 5\' 5"  (1.651 m), weight 64 kg (141 lb 1.5 oz), SpO2 100 %.    IV in place, occlusive dsg intact without redness.  Orientation to room, and floor completed with information packet given to patient/family.  Patient declined safety video at this time.  Admission INP armband ID verified with patient/family, and in place.   SR up x 2, fall assessment completed. Call light within reach, patient is not able to express herself due to expressive aphasia. Pt has an unstageable pressure ulcer on right posterior hand,  Healing ulcer on left bottom, stage 2 on right buttock, and bilateral deep tissue injury on hills   Will cont to eval and treat per MD orders.  Dorris Carnes, RN 11/06/2016 6:25 PM

## 2016-11-06 NOTE — H&P (Signed)
Report received from  [place] at [facility] at 1418.

## 2016-11-06 NOTE — Progress Notes (Signed)
Pharmacist Heart Failure Core Measure Documentation  Assessment: Casey [name] has an EF documented as 20-25% on 11/04/16 by ECHO.  Rationale: Heart failure patients with left ventricular systolic dysfunction (LVSD) and an EF < 40% should be prescribed an angiotensin converting enzyme inhibitor (ACEI) or angiotensin receptor blocker (ARB) at discharge unless a contraindication is documented in the medical record.  This patient is not currently on an ACEI or ARB for HF.  This note is being placed in the record in order to provide documentation that a contraindication to the use of these agents is present for this encounter.  ACE Inhibitor or Angiotensin Receptor Blocker is contraindicated (specify all that apply)  []   ACEI allergy AND ARB allergy []   Angioedema []   Moderate or severe aortic stenosis []   Hyperkalemia [x]   Hypotension []   Renal artery stenosis [x]   Worsening renal function, preexisting renal disease or dysfunction   Deboraha Sprang 11/06/2016 9:11 AM

## 2016-11-06 NOTE — Progress Notes (Signed)
Nurse notified  

## 2016-11-07 DIAGNOSIS — L8990 Pressure ulcer of unspecified site, unspecified stage: Secondary | ICD-10-CM | POA: Insufficient documentation

## 2016-11-07 LAB — CBC
HCT: 30.4 % — ABNORMAL LOW (ref 36.0–46.0)
HEMOGLOBIN: 8.5 g/dL — AB (ref 12.0–15.0)
MCH: 23.9 pg — AB (ref 26.0–34.0)
MCHC: 28 g/dL — ABNORMAL LOW (ref 30.0–36.0)
MCV: 85.4 fL (ref 78.0–100.0)
Platelets: 269 10*3/uL (ref 150–400)
RBC: 3.56 MIL/uL — AB (ref 3.87–5.11)
RDW: 17.9 % — ABNORMAL HIGH (ref 11.5–15.5)
WBC: 9.6 10*3/uL (ref 4.0–10.5)

## 2016-11-07 LAB — BASIC METABOLIC PANEL
Anion gap: 11 (ref 5–15)
BUN: 65 mg/dL — AB (ref 6–20)
CHLORIDE: 110 mmol/L (ref 101–111)
CO2: 30 mmol/L (ref 22–32)
Calcium: 9.7 mg/dL (ref 8.9–10.3)
Creatinine, Ser: 2.09 mg/dL — ABNORMAL HIGH (ref 0.44–1.00)
GFR calc non Af Amer: 22 mL/min — ABNORMAL LOW (ref >60)
GFR, EST AFRICAN AMERICAN: 25 mL/min — AB (ref >60)
Glucose, Bld: 208 mg/dL — ABNORMAL HIGH (ref 65–99)
POTASSIUM: 3.4 mmol/L — AB (ref 3.5–5.1)
SODIUM: 151 mmol/L — AB (ref 135–145)

## 2016-11-07 LAB — GLUCOSE, CAPILLARY
GLUCOSE-CAPILLARY: 192 mg/dL — AB (ref 65–99)
GLUCOSE-CAPILLARY: 207 mg/dL — AB (ref 65–99)
GLUCOSE-CAPILLARY: 229 mg/dL — AB (ref 65–99)
GLUCOSE-CAPILLARY: 237 mg/dL — AB (ref 65–99)
GLUCOSE-CAPILLARY: 284 mg/dL — AB (ref 65–99)

## 2016-11-07 MED ORDER — LEVOFLOXACIN 25 MG/ML PO SOLN
500.0000 mg | ORAL | Status: DC
  Administered 2016-11-08: 500 mg
  Filled 2016-11-07 (×2): qty 20

## 2016-11-07 MED ORDER — FREE WATER
200.0000 mL | Freq: Three times a day (TID) | Status: DC
  Administered 2016-11-07 – 2016-11-08 (×4): 200 mL

## 2016-11-07 NOTE — NC FL2 (Signed)
[place] MEDICAID FL2 LEVEL OF CARE SCREENING TOOL     IDENTIFICATION  Patient Name: Casey [name] Birthdate: 02/03/1940 Sex: female Admission Date (Current Location): 11/03/2016  [facility] and [place] Number:  Herbalist and Address:  [facility] [place]. [facility], [place] [address], [place], [place] [address]      Provider Number: [id number]  Attending Physician Name and Address:  Lavina Hamman, MD  Relative Name and Phone Number:       Current Level of Care: Hospital Recommended Level of Care: [place] Prior Approval Number:    Date Approved/Denied:   PASRR Number:    Discharge Plan: SNF    Current Diagnoses: Patient Active Problem List   Diagnosis Date Noted  . Pressure injury of skin 11/07/2016  . Deep tissue injury 11/05/2016  . Malnutrition of moderate degree 11/05/2016  . Sepsis ([place]) 11/04/2016  . Hypercalcemia 11/04/2016  . Aspiration pneumonia ([place]) 11/04/2016  . Acute metabolic encephalopathy 25/42/7062  . Elevated troponin 11/04/2016  . Atrial fibrillation with RVR ([place]) 11/04/2016  . Altered mental status   . Lactic acidosis   . Acute renal failure superimposed on stage 3 chronic kidney disease ([place]) 10/05/2016  . Thrombocytopenia ([place]) 10/05/2016  . Esophageal dysphagia 10/05/2016  . Candida UTI 10/05/2016  . Anemia due to gastrointestinal blood loss 10/05/2016  . Seizure disorder ([place]) 10/05/2016  . Hypokalemia 10/05/2016  . Hypomagnesemia 10/05/2016  . Hypernatremia 10/05/2016  . Severe protein-calorie malnutrition ([place]) 10/05/2016  . Failure to thrive in adult 10/05/2016  . HCAP (healthcare-associated pneumonia)   . Encounter for palliative care   . Goals of care, counseling/discussion   . Acute respiratory failure with hypoxia ([place])   . Acute on chronic systolic CHF (congestive heart failure), NYHA class 4 ([place])   . DCM (dilated cardiomyopathy) ([place])   . Pneumonia due to gram-positive bacteria 09/14/2016   . Hypotension 09/10/2016  . Chronic kidney disease (CKD), stage III (moderate) ([place]) 09/10/2016  . Acute generalized abdominal pain 09/07/2016  . Acute chest pain 09/07/2016  . Atrial fibrillation with rapid ventricular response ([place]) 09/07/2016  . Wound discharge 09/07/2016  . Anemia 09/07/2016  . Femoral-femoral bypass graft thrombosis, left ([place]) 09/03/2016  . Pseudoaneurysm of femoral artery ([place]) 08/30/2016  . Vascular dementia without behavioral disturbance 08/29/2016  . History of CVA with residual deficit 08/29/2016  . Type 2 diabetes mellitus with diabetic neuropathy, without long-term current use of insulin ([place]) 08/29/2016  . Depression 04/24/2016  . Loss of weight 04/24/2016  . Hypertensive heart disease with congestive heart failure ([place]) 12/30/2014  . Dyslipidemia associated with type 2 diabetes mellitus ([place]) 12/30/2014  . CKD stage 3 due to type 2 diabetes mellitus ([place]) 12/06/2014  . 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 Chronic systolic congestive heart failure ([place]) 02/09/2013  . Solitary pulmonary nodule 02/09/2013  . Seizures ([place])   . PVD (peripheral vascular disease) ([place]) 09/30/2012  . Gout of multiple sites 05/06/2012  . Dementia without behavioral disturbance 05/06/2012  . Atherosclerosis of native artery of extremity with intermittent claudication ([place]) 02/20/2012  . AKI (acute kidney injury) ([place]) 06/18/2011  . Type 2 diabetes mellitus with diabetic neuropathy ([place]) 12/29/2010  . Atrial fibrillation ([place])   . Late effects of CVA (cerebrovascular accident)     Orientation RESPIRATION BLADDER Height & Weight     Self, Place  Normal Incontinent Weight: 141 lb 1.5 oz (64 kg) Height:  5\' 5"  (165.1 cm)  BEHAVIORAL SYMPTOMS/MOOD NEUROLOGICAL BOWEL NUTRITION  STATUS      Incontinent Feeding tube  AMBULATORY STATUS COMMUNICATION OF NEEDS Skin   Total Care Verbally Other (Comment) (DTI on heel foam dressing changes q 5)                       Personal Care  Assistance Level of Assistance  Bathing, Total care, Dressing, Feeding Bathing Assistance: Maximum assistance Feeding assistance: Maximum assistance Dressing Assistance: Maximum assistance Total Care Assistance: Maximum assistance   Functional Limitations Info             SPECIAL CARE FACTORS FREQUENCY                       Contractures      Additional Factors Info  Code Status, Allergies, Insulin Sliding Scale Code Status Info: FULL Allergies Info: Penicillins   Insulin Sliding Scale Info: 7/day       Current Medications (11/07/2016):  This is the current hospital active medication list Current Facility-Administered Medications  Medication Dose Route Frequency Provider Last Rate Last Dose  . 0.9 %  sodium chloride infusion  250 mL Intravenous PRN Aljishi, Virgina Norfolk, MD      . acetaminophen (TYLENOL) tablet 650 mg  650 mg Oral Q6H PRN Ivor Costa, MD       Or  . acetaminophen (TYLENOL) suppository 650 mg  650 mg Rectal Q6H PRN Ivor Costa, MD      . allopurinol (ZYLOPRIM) tablet 100 mg  100 mg Per Tube Daily Ivor Costa, MD   100 mg at 11/07/16 1048  . amantadine (SYMMETREL) 50 MG/5ML solution 100 mg  100 mg Per Tube BID Ivor Costa, MD   100 mg at 11/07/16 1049  . atorvastatin (LIPITOR) tablet 10 mg  10 mg Per Tube QHS Ivor Costa, MD   10 mg at 11/06/16 2147  . cefTRIAXone (ROCEPHIN) 1 g in dextrose 5 % 50 mL IVPB  1 g Intravenous Q24H Aldean Jewett, MD   Stopped at 11/07/16 0535  . diltiazem (CARDIZEM) 10 mg/ml oral suspension 30 mg  30 mg Per Tube Q6H Mariel Aloe, MD   30 mg at 11/07/16 0651  . donepezil (ARICEPT) tablet 10 mg  10 mg Per Tube QHS Ivor Costa, MD   10 mg at 11/06/16 2148  . feeding supplement (NEPRO CARB STEADY) liquid 1,000 mL  1,000 mL Per Tube Continuous Mariel Aloe, MD 40 mL/hr at 11/06/16 0800 1,000 mL at 11/06/16 0800  . feeding supplement (PRO-STAT SUGAR FREE 64) liquid 30 mL  30 mL Per Tube Daily Mariel Aloe, MD   30 mL at 11/07/16 1050   . ferrous sulfate 300 (60 Fe) MG/5ML syrup 300 mg  300 mg Per Tube BID WC Ivor Costa, MD   300 mg at 11/07/16 1048  . free water 200 mL  200 mL Per Tube Q8H Lavina Hamman, MD      . heparin injection 5,000 Units  5,000 Units Subcutaneous Q8H Juanito Doom, MD   5,000 Units at 11/07/16 [phone]  . hydrALAZINE (APRESOLINE) injection 5 mg  5 mg Intravenous Q2H PRN Ivor Costa, MD      . hydrOXYzine (ATARAX/VISTARIL) tablet 10 mg  10 mg Per Tube TID PRN Ivor Costa, MD      . insulin aspart (novoLOG) injection 0-9 Units  0-9 Units Subcutaneous Q4H Mariel Aloe, MD   3 Units at 11/07/16 0848  . insulin glargine (LANTUS) injection  10 Units  10 Units Subcutaneous QHS Mariel Aloe, MD   10 Units at 11/06/16 2201  . levETIRAcetam (KEPPRA) 100 MG/ML solution 250 mg  250 mg Oral BID Mariel Aloe, MD   250 mg at 11/07/16 1049  . LORazepam (ATIVAN) injection 1 mg  1 mg Intravenous Q2H PRN Ivor Costa, MD      . metoprolol tartrate (LOPRESSOR) tablet 50 mg  50 mg Per Tube BID Ivor Costa, MD   50 mg at 11/07/16 1048  . metroNIDAZOLE (FLAGYL) 50 mg/ml oral suspension 500 mg  500 mg Per Tube Daily Mariel Aloe, MD   500 mg at 11/07/16 1050  . mirtazapine (REMERON) tablet 7.5 mg  7.5 mg Per Tube QHS Ivor Costa, MD   7.5 mg at 11/06/16 2144  . morphine 4 MG/ML injection 1 mg  1 mg Intravenous Q4H PRN Ivor Costa, MD   1 mg at 11/05/16 0150  . multivitamin with minerals tablet 1 tablet  1 tablet Per Tube Daily Ivor Costa, MD   1 tablet at 11/07/16 1047  . pantoprazole (PROTONIX) injection 40 mg  40 mg Intravenous Q12H Ivor Costa, MD      . zinc sulfate capsule 220 mg  220 mg Per Tube QHS Ivor Costa, MD   220 mg at 11/06/16 2147  . zolpidem (AMBIEN) tablet 5 mg  5 mg Oral QHS PRN Ivor Costa, MD         Discharge Medications: Please see discharge summary for a list of discharge medications.  Relevant Imaging Results:  Relevant Lab Results:   Additional Information SS#: [id number]  Jorge Ny,  LCSW

## 2016-11-07 NOTE — Progress Notes (Signed)
PROGRESS NOTE    Casey [name]  KKX:[id number] DOB: [birth year] DOA: 11/03/2016 PCP: Gildardo Cranker, DO   Brief Narrative: Casey [name] is a 76 y.o. female history of CVA, dementia, chronic kidney disease, seizures disorder, dementia, chronic systolic CHF, type 2 diabetes, chronic atrial fibrillation. She presented with altered mental status with concern for a UTI and had associated atrial fibrillation with RVR. She is on empiric antibiotics.   Assessment & Plan:     Aspiration pneumonia Chest x-ray significant for atelectasis vs pneumonia. Urine culture negative for bacteria. -blood culture pending (no growth x2 days) Change to levaquin, penn allergy.   Moderate malnutrition PEG dependent Dysphagia Nepro 71ml/hr continuous  Atrial fibrillation with RVR Resolved on cardizem drip -continue Cardizem PO  Acute metabolic encephalopathy Secondary to infection. Appears to be at baseline.  CKD stage III Stable  Hypernatremia Likely secondary to poor free water intake. S/p IV fluids -if no improvement, may need to increase free water in diet Free water 24ml q8.   Diabetes mellitus, type 2 -SSI q4 hours -increase to Lantus 10 units   DVT prophylaxis: Heparin Code Status: Full code Family Communication: Daughter on telephone Disposition Plan: Discharge to SNF   Consultants:   PCCM  Procedures:   None  Antimicrobials:  Ceftriaxone    Subjective: No acute events, no acute complains, per RN no change in condition.   Objective: Vitals:   11/07/16 0059 11/07/16 0149 11/07/16 0449 11/07/16 1313  BP: 107/80  110/65 111/79  Pulse: (!) 124 87 98 (!) 153  Resp:   (!) 22 (!) 30  Temp:   98.6 F (37 C)   TempSrc:   Oral   SpO2:  100% 100% 96%  Weight:      Height:        Intake/Output Summary (Last 24 hours) at 11/07/16 1809 Last data filed at 11/07/16 1345  Gross per 24 hour  Intake             1200 ml  Output                0 ml  Net              1200 ml   Filed Weights   11/05/16 0400 11/06/16 0300 11/06/16 1604  Weight: 58.7 kg (129 lb 6.6 oz) 57.9 kg (127 lb 10.3 oz) 64 kg (141 lb 1.5 oz)    Examination:  General exam: Appears calm and comfortable.  Respiratory system: Clear to auscultation. Respiratory effort normal. Cardiovascular system: S1 & S2 heard, RRR. No murmurs. Gastrointestinal system: Abdomen is nondistended, soft and nontender. Normal bowel sounds heard. Central nervous system: Alert and oriented to person and place. Right hemiparesis Extremities: No edema. No calf tenderness Skin: No cyanosis. No rashes  Data Reviewed: I have personally reviewed following labs and imaging studies  CBC:  Recent Labs Lab 11/03/16 2219 11/04/16 0025 11/04/16 1617 11/04/16 1930 11/05/16 0227 11/07/16 0351  WBC 10.1 10.8* 10.8* 9.2 9.2 9.6  NEUTROABS 8.8*  --   --   --   --   --   HGB 9.5* 8.9* 9.4* 9.6* 9.4* 8.5*  HCT 31.7* 30.4* 31.3* 31.8* 30.8* 30.4*  MCV 84.8 84.9 85.5 85.5 84.6 85.4  PLT 333 272 PLATELET CLUMPS NOTED ON SMEAR, UNABLE TO ESTIMATE 223 PLATELET CLUMPS NOTED ON SMEAR, COUNT APPEARS ADEQUATE 169   Basic Metabolic Panel:  Recent Labs Lab 11/03/16 2219 11/04/16 1617 11/05/16 0227 11/06/16 1113 11/07/16 0351  NA 148*  149* 149* 147* 151*  K 4.0 3.7 3.6 3.5 3.4*  CL 102 113* 111 109 110  CO2 33* 27 28 28 30   GLUCOSE 350* 104* 98 213* 208*  BUN 65* 55* 51* 63* 65*  CREATININE 1.76* 1.49* 1.43* 2.02* 2.09*  CALCIUM 10.7* 9.4 9.5 9.7 9.7   GFR: Estimated Creatinine Clearance: 20.6 mL/min (A) (by C-G formula based on SCr of 2.09 mg/dL (H)). Liver Function Tests:  Recent Labs Lab 11/03/16 2219  AST 26  ALT 17  ALKPHOS 115  BILITOT 0.5  PROT 7.9  ALBUMIN 2.8*   No results for input(s): LIPASE, AMYLASE in the last 168 hours. No results for input(s): AMMONIA in the last 168 hours. Coagulation Profile:  Recent Labs Lab 11/03/16 2219  INR 1.32   Cardiac Enzymes:  Recent Labs Lab  11/04/16 0217  TROPONINI 0.29*   BNP (last 3 results) No results for input(s): PROBNP in the last 8760 hours. HbA1C: No results for input(s): HGBA1C in the last 72 hours. CBG:  Recent Labs Lab 11/06/16 1603 11/06/16 1958 11/07/16 0038 11/07/16 0447 11/07/16 0755  GLUCAP 130* 156* 192* 207* 229*   Lipid Profile: No results for input(s): CHOL, HDL, LDLCALC, TRIG, CHOLHDL, LDLDIRECT in the last 72 hours. Thyroid Function Tests: No results for input(s): TSH, T4TOTAL, FREET4, T3FREE, THYROIDAB in the last 72 hours. Anemia Panel: No results for input(s): VITAMINB12, FOLATE, FERRITIN, TIBC, IRON, RETICCTPCT in the last 72 hours. Sepsis Labs:  Recent Labs Lab 11/03/16 2225 11/04/16 0025 11/04/16 0413  PROCALCITON  --  0.46  --   LATICACIDVEN 3.65* 3.0* 2.7*    Recent Results (from the past 240 hour(s))  Culture, respiratory (NON-Expectorated)     Status: None   Collection Time: 10/28/16  6:18 PM  Result Value Ref Range Status   Specimen Description TRACHEAL ASPIRATE  Final   Special Requests NONE  Final   Gram Stain   Final    DEGENERATED CELLULAR MATERIAL PRESENT MODERATE GRAM POSITIVE COCCI IN CLUSTERS IN PAIRS FEW GRAM NEGATIVE RODS FEW GRAM POSITIVE RODS    Culture FEW Consistent with normal respiratory flora.  Final   Report Status 10/31/2016 FINAL  Final  Culture, blood (routine x 2)     Status: None   Collection Time: 10/28/16  8:41 PM  Result Value Ref Range Status   Specimen Description BLOOD RIGHT HAND  Final   Special Requests   Final    BOTTLES DRAWN AEROBIC AND ANAEROBIC Blood Culture adequate volume   Culture NO GROWTH 5 DAYS  Final   Report Status 11/02/2016 FINAL  Final  Culture, blood (routine x 2)     Status: None   Collection Time: 10/28/16  8:49 PM  Result Value Ref Range Status   Specimen Description BLOOD RIGHT HAND  Final   Special Requests   Final    BOTTLES DRAWN AEROBIC AND ANAEROBIC Blood Culture adequate volume   Culture NO GROWTH 5  DAYS  Final   Report Status 11/02/2016 FINAL  Final  Culture, blood (Routine x 2)     Status: None (Preliminary result)   Collection Time: 11/03/16 10:01 PM  Result Value Ref Range Status   Specimen Description BLOOD RIGHT ANTECUBITAL  Final   Special Requests IN PEDIATRIC BOTTLE Blood Culture adequate volume  Final   Culture NO GROWTH 4 DAYS  Final   Report Status PENDING  Incomplete  Culture, blood (Routine x 2)     Status: None (Preliminary result)   Collection Time:  11/03/16 10:10 PM  Result Value Ref Range Status   Specimen Description BLOOD RIGHT HAND  Final   Special Requests IN PEDIATRIC BOTTLE Blood Culture adequate volume  Final   Culture NO GROWTH 4 DAYS  Final   Report Status PENDING  Incomplete  Urine Culture     Status: None   Collection Time: 11/03/16 10:36 PM  Result Value Ref Range Status   Specimen Description URINE, CATHETERIZED  Final   Special Requests NONE  Final   Culture NO GROWTH  Final   Report Status 11/05/2016 FINAL  Final  MRSA PCR Screening     Status: None   Collection Time: 11/04/16  4:40 AM  Result Value Ref Range Status   MRSA by PCR NEGATIVE NEGATIVE Final    Comment:        The GeneXpert MRSA Assay (FDA approved for NASAL specimens only), is one component of a comprehensive MRSA colonization surveillance program. It is not intended to diagnose MRSA infection nor to guide or monitor treatment for MRSA infections.          Radiology Studies: No results found.      Scheduled Meds: . allopurinol  100 mg Per Tube Daily  . amantadine  100 mg Per Tube BID  . atorvastatin  10 mg Per Tube QHS  . diltiazem  30 mg Per Tube Q6H  . donepezil  10 mg Per Tube QHS  . feeding supplement (PRO-STAT SUGAR FREE 64)  30 mL Per Tube Daily  . ferrous sulfate  300 mg Per Tube BID WC  . free water  200 mL Per Tube Q8H  . heparin subcutaneous  5,000 Units Subcutaneous Q8H  . insulin aspart  0-9 Units Subcutaneous Q4H  . insulin glargine  10 Units  Subcutaneous QHS  . levETIRAcetam  250 mg Oral BID  . metoprolol tartrate  50 mg Per Tube BID  . mirtazapine  7.5 mg Per Tube QHS  . multivitamin with minerals  1 tablet Per Tube Daily  . pantoprazole  40 mg Intravenous Q12H  . zinc sulfate  220 mg Per Tube QHS   Continuous Infusions: . sodium chloride    . cefTRIAXone (ROCEPHIN)  IV Stopped (11/07/16 0535)  . feeding supplement (NEPRO CARB STEADY) 1,000 mL (11/06/16 0800)     LOS: 3 days     Author:  Berle Mull, MD [facility] Pager: [phone] 11/07/2016 6:12 PM     If 7PM-7AM, please contact night-coverage [email or web address] Password [email or web address]

## 2016-11-07 NOTE — Progress Notes (Signed)
PHARMACY NOTE:  ANTIMICROBIAL RENAL DOSAGE ADJUSTMENT  Current antimicrobial regimen includes a mismatch between antimicrobial dosage and estimated renal function.  As per policy approved by the Pharmacy & Therapeutics and Medical Executive Committees, the antimicrobial dosage will be adjusted accordingly.  Current antimicrobial dosage:  Levofloxacin 500mg  Q24H x3 doses  Indication: Pneumonia  Renal Function: Estimated Creatinine Clearance: 20.6 mL/min (A) (by C-G formula based on SCr of 2.09 mg/dL (H)).    Antimicrobial dosage has been changed to:  Levofloxacin 500mg  Q48H x2 doses  Thank you for allowing pharmacy to be a part of this patient's care.  Haze Boyden, [facility] 11/07/2016 6:14 PM

## 2016-11-08 LAB — BASIC METABOLIC PANEL
ANION GAP: 9 (ref 5–15)
BUN: 56 mg/dL — ABNORMAL HIGH (ref 6–20)
CO2: 26 mmol/L (ref 22–32)
Calcium: 9.6 mg/dL (ref 8.9–10.3)
Chloride: 111 mmol/L (ref 101–111)
Creatinine, Ser: 1.58 mg/dL — ABNORMAL HIGH (ref 0.44–1.00)
GFR calc Af Amer: 36 mL/min — ABNORMAL LOW (ref >60)
GFR, EST NON AFRICAN AMERICAN: 31 mL/min — AB (ref >60)
GLUCOSE: 273 mg/dL — AB (ref 65–99)
POTASSIUM: 4 mmol/L (ref 3.5–5.1)
SODIUM: 146 mmol/L — AB (ref 135–145)

## 2016-11-08 LAB — CBC WITH DIFFERENTIAL/PLATELET
BASOS PCT: 0 %
Basophils Absolute: 0 10*3/uL (ref 0.0–0.1)
EOS ABS: 0.4 10*3/uL (ref 0.0–0.7)
EOS PCT: 3 %
HEMATOCRIT: 32.6 % — AB (ref 36.0–46.0)
Hemoglobin: 9.1 g/dL — ABNORMAL LOW (ref 12.0–15.0)
Lymphocytes Relative: 11 %
Lymphs Abs: 1.2 10*3/uL (ref 0.7–4.0)
MCH: 23.8 pg — ABNORMAL LOW (ref 26.0–34.0)
MCHC: 27.9 g/dL — AB (ref 30.0–36.0)
MCV: 85.3 fL (ref 78.0–100.0)
MONO ABS: 0.9 10*3/uL (ref 0.1–1.0)
MONOS PCT: 9 %
Neutro Abs: 8.1 10*3/uL — ABNORMAL HIGH (ref 1.7–7.7)
Neutrophils Relative %: 77 %
PLATELETS: 248 10*3/uL (ref 150–400)
RBC: 3.82 MIL/uL — ABNORMAL LOW (ref 3.87–5.11)
RDW: 18 % — AB (ref 11.5–15.5)
WBC: 10.6 10*3/uL — ABNORMAL HIGH (ref 4.0–10.5)

## 2016-11-08 LAB — GLUCOSE, CAPILLARY
GLUCOSE-CAPILLARY: 188 mg/dL — AB (ref 65–99)
GLUCOSE-CAPILLARY: 233 mg/dL — AB (ref 65–99)
GLUCOSE-CAPILLARY: 257 mg/dL — AB (ref 65–99)
Glucose-Capillary: 169 mg/dL — ABNORMAL HIGH (ref 65–99)
Glucose-Capillary: 192 mg/dL — ABNORMAL HIGH (ref 65–99)
Glucose-Capillary: 286 mg/dL — ABNORMAL HIGH (ref 65–99)

## 2016-11-08 LAB — COMPREHENSIVE METABOLIC PANEL
ALBUMIN: 2.4 g/dL — AB (ref 3.5–5.0)
ALT: 21 U/L (ref 14–54)
ANION GAP: 10 (ref 5–15)
AST: 21 U/L (ref 15–41)
Alkaline Phosphatase: 113 U/L (ref 38–126)
BILIRUBIN TOTAL: 0.5 mg/dL (ref 0.3–1.2)
BUN: 59 mg/dL — ABNORMAL HIGH (ref 6–20)
CO2: 27 mmol/L (ref 22–32)
Calcium: 10 mg/dL (ref 8.9–10.3)
Chloride: 113 mmol/L — ABNORMAL HIGH (ref 101–111)
Creatinine, Ser: 1.72 mg/dL — ABNORMAL HIGH (ref 0.44–1.00)
GFR calc Af Amer: 32 mL/min — ABNORMAL LOW (ref >60)
GFR calc non Af Amer: 28 mL/min — ABNORMAL LOW (ref >60)
GLUCOSE: 186 mg/dL — AB (ref 65–99)
POTASSIUM: 3.3 mmol/L — AB (ref 3.5–5.1)
SODIUM: 150 mmol/L — AB (ref 135–145)
TOTAL PROTEIN: 6.6 g/dL (ref 6.5–8.1)

## 2016-11-08 LAB — OSMOLALITY: Osmolality: 342 mOsm/kg (ref 275–295)

## 2016-11-08 LAB — CULTURE, BLOOD (ROUTINE X 2)
CULTURE: NO GROWTH
Culture: NO GROWTH
Special Requests: ADEQUATE
Special Requests: ADEQUATE

## 2016-11-08 LAB — MAGNESIUM: Magnesium: 2.3 mg/dL (ref 1.7–2.4)

## 2016-11-08 MED ORDER — POTASSIUM CHLORIDE 20 MEQ PO PACK
20.0000 meq | PACK | Freq: Once | ORAL | Status: AC
  Administered 2016-11-08: 20 meq
  Filled 2016-11-08: qty 1

## 2016-11-08 MED ORDER — FREE WATER
200.0000 mL | Freq: Four times a day (QID) | Status: DC
  Administered 2016-11-08 – 2016-11-10 (×7): 200 mL

## 2016-11-08 MED ORDER — DEXTROSE 5 % IV SOLN
INTRAVENOUS | Status: DC
  Administered 2016-11-08 – 2016-11-09 (×2): via INTRAVENOUS

## 2016-11-08 NOTE — Clinical Social Work Note (Signed)
Clinical Social Work Assessment  Patient Details  Name: Casey [name] MRN: [id number] Date of Birth: August 29, 1940  Date of referral:  11/08/16               Reason for consult:  Discharge Planning                Permission sought to share information with:  Facility Sport and exercise psychologist, Family Supports Permission granted to share information::  No  Name::     Psychologist, forensic::  [place]  Relationship::  Daughter  Contact Information:  [phone]  Housing/Transportation Living arrangements for the past 2 months:  [place] of Information:  Adult Children Patient Interpreter Needed:  None Criminal Activity/Legal Involvement Pertinent to Current Situation/Hospitalization:  No - Comment as needed Significant Relationships:  Adult Children Lives with:  Facility Resident Do you feel safe going back to the place where you live?  Yes Need for family participation in patient care:  Yes (Comment)  Care giving concerns:  CSW received consult regarding discharge planning. CSW spoke with patient's daughter. Patient has been living at [facility] for the past two and a half years and will return there at discharge. CSW to continue to follow and assist with discharge planning needs.   Social Worker assessment / plan:  CSW spoke with patient's daughter regarding return to [employer] [facility].   Employment status:  Retired Forensic scientist:  Medicare PT Recommendations:  Not assessed at this time Information / Referral to community resources:  [place]  Patient/Family's Response to care: Patient's daughter expressed agreement with discharge plan.   Patient/Family's Understanding of and Emotional Response to Diagnosis, Current Treatment, and Prognosis:  Patient/family is realistic regarding therapy needs and expressed being hopeful for return to SNF placement. Patient's daughter expressed understanding of CSW role and discharge process as well as medical  condition. No questions/concerns about plan or treatment.    Emotional Assessment Appearance:  Appears stated age Attitude/Demeanor/Rapport:  Unable to Assess Affect (typically observed):  Unable to Assess Orientation:   (Disoriented x4) Alcohol / Substance use:  Not Applicable Psych involvement (Current and /or in the community):  No (Comment)  Discharge Needs  Concerns to be addressed:  Care Coordination Readmission within the last 30 days:  Yes Current discharge risk:  None Barriers to Discharge:  Continued Medical Work up   [employer], [place] 11/08/2016, 2:16 PM

## 2016-11-08 NOTE — Care Management Important Message (Signed)
Important Message  Patient Details  Name: Casey [name] MRN: [id number] Date of Birth: [birth year]   Medicare Important Message Given:  Yes    Nathen May 11/08/2016, 9:49 AM

## 2016-11-08 NOTE — Progress Notes (Signed)
CRITICAL VALUE ALERT  Critical Value:  Serum osmolality=342  Date & Time Notied:  11/08/16 0730  Provider Notified: Marlowe Sax, MD  Orders Received/Actions taken: Will continue to monitor.

## 2016-11-08 NOTE — Progress Notes (Signed)
PROGRESS NOTE    Casey [name]  CBJ:[id number] DOB: 08-28-1940 DOA: 11/03/2016 PCP: Gildardo Cranker, DO   Brief Narrative: Casey [name] is a 76 y.o. female history of CVA, dementia, chronic kidney disease, seizures disorder, dementia, chronic systolic CHF, type 2 diabetes, chronic atrial fibrillation. She presented with altered mental status with concern for a UTI and had associated atrial fibrillation with RVR. She is on empiric antibiotics.   Assessment & Plan:     Aspiration pneumonia Chest x-ray significant for atelectasis vs pneumonia. Urine culture negative for bacteria. -blood culture no growth for final report Change to levaquin, penn allergy. Completed 7 days of antibiotic course. remains at recurrent risk for aspiration  Moderate malnutrition PEG dependent Dysphagia Nepro tube feeding via PEG tube Pro-stat ordered per RD  Atrial fibrillation with RVR Resolved on cardizem drip -continue Cardizem PO  Acute metabolic encephalopathy Secondary to infection. Appears to be at baseline.  CKD stage III Stable  Hypernatremia Likely secondary to poor free water intake. S/p IV fluids Currently on free water 200 mg every 8 hours. Sodium improved with addition of D5, we'll increase the frequency of free water. Initially started on D5 at 64, we'll reduce to 40.  Diabetes mellitus, type 2 -SSI q4 hours -increase to Lantus 10 units  DVT prophylaxis: Heparin Code Status: Full code Family Communication: Attempted to reach the daughter on phone, left a voicemail Disposition Plan: Discharge to SNF, possibly tomorrow   Consultants:   PCCM  Procedures:   None  Antimicrobials:  Ceftriaxone    Subjective: No acute events, denies any acute complaints of pain anywhere and nausea no breathing issues.  Objective: Vitals:   11/08/16 0129 11/08/16 0500 11/08/16 0920 11/08/16 1100  BP: (!) 128/52 117/68 138/62 120/68  Pulse: (!) 129 (!) 107 90 89  Resp: (!)  32 20    Temp: 99.6 F (37.6 C) 98.2 F (36.8 C) 98.7 F (37.1 C)   TempSrc:  Axillary Axillary   SpO2: 100% 100%    Weight:  62.2 kg (137 lb 2 oz)    Height:        Intake/Output Summary (Last 24 hours) at 11/08/16 1627 Last data filed at 11/08/16 1100  Gross per 24 hour  Intake           264.67 ml  Output             2000 ml  Net         -1735.33 ml   Filed Weights   11/06/16 0300 11/06/16 1604 11/08/16 0500  Weight: 57.9 kg (127 lb 10.3 oz) 64 kg (141 lb 1.5 oz) 62.2 kg (137 lb 2 oz)    Examination:  General exam: Appears calm and comfortable.  Respiratory system: Clear to auscultation. Respiratory effort normal. Cardiovascular system: S1 & S2 heard, RRR. No murmurs. Gastrointestinal system: Abdomen is nondistended, soft and nontender. Normal bowel sounds heard. Central nervous system: Alert and oriented to person and place. Right hemiparesis Extremities: No edema. No calf tenderness Skin: No cyanosis. No rashes  Data Reviewed: I have personally reviewed following labs and imaging studies  CBC:  Recent Labs Lab 11/03/16 2219  11/04/16 1617 11/04/16 1930 11/05/16 0227 11/07/16 0351 11/08/16 0611  WBC 10.1  < > 10.8* 9.2 9.2 9.6 10.6*  NEUTROABS 8.8*  --   --   --   --   --  8.1*  HGB 9.5*  < > 9.4* 9.6* 9.4* 8.5* 9.1*  HCT 31.7*  < >  31.3* 31.8* 30.8* 30.4* 32.6*  MCV 84.8  < > 85.5 85.5 84.6 85.4 85.3  PLT 333  < > PLATELET CLUMPS NOTED ON SMEAR, UNABLE TO ESTIMATE 223 PLATELET CLUMPS NOTED ON SMEAR, COUNT APPEARS ADEQUATE 269 248  < > = values in this interval not displayed. Basic Metabolic Panel:  Recent Labs Lab 11/05/16 0227 11/06/16 1113 11/07/16 0351 11/08/16 0611 11/08/16 1437  NA 149* 147* 151* 150* 146*  K 3.6 3.5 3.4* 3.3* 4.0  CL 111 109 110 113* 111  CO2 28 28 30 27 26   GLUCOSE 98 213* 208* 186* 273*  BUN 51* 63* 65* 59* 56*  CREATININE 1.43* 2.02* 2.09* 1.72* 1.58*  CALCIUM 9.5 9.7 9.7 10.0 9.6  MG  --   --   --  2.3  --     GFR: Estimated Creatinine Clearance: 27.3 mL/min (A) (by C-G formula based on SCr of 1.58 mg/dL (H)). Liver Function Tests:  Recent Labs Lab 11/03/16 2219 11/08/16 0611  AST 26 21  ALT 17 21  ALKPHOS 115 113  BILITOT 0.5 0.5  PROT 7.9 6.6  ALBUMIN 2.8* 2.4*   No results for input(s): LIPASE, AMYLASE in the last 168 hours. No results for input(s): AMMONIA in the last 168 hours. Coagulation Profile:  Recent Labs Lab 11/03/16 2219  INR 1.32   Cardiac Enzymes:  Recent Labs Lab 11/04/16 0217  TROPONINI 0.29*   BNP (last 3 results) No results for input(s): PROBNP in the last 8760 hours. HbA1C: No results for input(s): HGBA1C in the last 72 hours. CBG:  Recent Labs Lab 11/07/16 2011 11/08/16 0036 11/08/16 0412 11/08/16 0815 11/08/16 1232  GLUCAP 237* 169* 192* 188* 233*   Lipid Profile: No results for input(s): CHOL, HDL, LDLCALC, TRIG, CHOLHDL, LDLDIRECT in the last 72 hours. Thyroid Function Tests: No results for input(s): TSH, T4TOTAL, FREET4, T3FREE, THYROIDAB in the last 72 hours. Anemia Panel: No results for input(s): VITAMINB12, FOLATE, FERRITIN, TIBC, IRON, RETICCTPCT in the last 72 hours. Sepsis Labs:  Recent Labs Lab 11/03/16 2225 11/04/16 0025 11/04/16 0413  PROCALCITON  --  0.46  --   LATICACIDVEN 3.65* 3.0* 2.7*    Recent Results (from the past 240 hour(s))  Culture, blood (Routine x 2)     Status: None   Collection Time: 11/03/16 10:01 PM  Result Value Ref Range Status   Specimen Description BLOOD RIGHT ANTECUBITAL  Final   Special Requests IN PEDIATRIC BOTTLE Blood Culture adequate volume  Final   Culture NO GROWTH 5 DAYS  Final   Report Status 11/08/2016 FINAL  Final  Culture, blood (Routine x 2)     Status: None   Collection Time: 11/03/16 10:10 PM  Result Value Ref Range Status   Specimen Description BLOOD RIGHT HAND  Final   Special Requests IN PEDIATRIC BOTTLE Blood Culture adequate volume  Final   Culture NO GROWTH 5 DAYS   Final   Report Status 11/08/2016 FINAL  Final  Urine Culture     Status: None   Collection Time: 11/03/16 10:36 PM  Result Value Ref Range Status   Specimen Description URINE, CATHETERIZED  Final   Special Requests NONE  Final   Culture NO GROWTH  Final   Report Status 11/05/2016 FINAL  Final  MRSA PCR Screening     Status: None   Collection Time: 11/04/16  4:40 AM  Result Value Ref Range Status   MRSA by PCR NEGATIVE NEGATIVE Final    Comment:  The GeneXpert MRSA Assay (FDA approved for NASAL specimens only), is one component of a comprehensive MRSA colonization surveillance program. It is not intended to diagnose MRSA infection nor to guide or monitor treatment for MRSA infections.          Radiology Studies: No results found.      Scheduled Meds: . allopurinol  100 mg Per Tube Daily  . amantadine  100 mg Per Tube BID  . atorvastatin  10 mg Per Tube QHS  . diltiazem  30 mg Per Tube Q6H  . donepezil  10 mg Per Tube QHS  . feeding supplement (PRO-STAT SUGAR FREE 64)  30 mL Per Tube Daily  . ferrous sulfate  300 mg Per Tube BID WC  . free water  200 mL Per Tube Q8H  . heparin subcutaneous  5,000 Units Subcutaneous Q8H  . insulin aspart  0-9 Units Subcutaneous Q4H  . insulin glargine  10 Units Subcutaneous QHS  . levETIRAcetam  250 mg Oral BID  . levofloxacin  500 mg Per Tube QODAY  . metoprolol tartrate  50 mg Per Tube BID  . mirtazapine  7.5 mg Per Tube QHS  . multivitamin with minerals  1 tablet Per Tube Daily  . pantoprazole  40 mg Intravenous Q12H  . zinc sulfate  220 mg Per Tube QHS   Continuous Infusions: . sodium chloride    . dextrose 75 mL/hr at 11/08/16 0857  . feeding supplement (NEPRO CARB STEADY) 1,000 mL (11/06/16 0800)     LOS: 4 days     Author:  Berle Mull, MD [facility] Pager: [phone] 11/08/2016 4:27 PM     If 7PM-7AM, please contact night-coverage [email or web address] Password [email or web address]

## 2016-11-08 NOTE — Progress Notes (Signed)
Nutrition Follow-up  DOCUMENTATION CODES:   Non-severe (moderate) malnutrition in context of chronic illness  INTERVENTION:   -Continue Nepro @ 40 ml/hr via PEG    30 ml Prostat daily.    Tube feeding regimen provides 1828 kcals, 93 grams, and 698 ml free water (1698 total free water)  -Continue MVI daily  NUTRITION DIAGNOSIS:   Malnutrition related to chronic illness (dementia, CVA, CKD, CHF) as evidenced by moderate depletion of body fat, severe depletion of muscle mass.  Ongoing  GOAL:   Patient will meet greater than or equal to 90% of their needs  Met with TF  MONITOR:   Labs, Weight trends, TF tolerance, I & O's  REASON FOR ASSESSMENT:   Consult Enteral/tube feeding initiation and management  ASSESSMENT:   76 year old female history of CVA, dementia, chronic kidney disease, seizures disorder, dementia, chronic systolic CHF, type 2 diabetes, chronic atrial fibrillation with has been rate controlled presents today from long-term care facility with reports that she has been less responsive and has had increased respiratory rate.   10/9- transferred from ICU to floor  Pt unable to participate in interview.   Pt remains on TF- Nepro infusing at 40 ml/hr via PEG. Pt also receiving MVI, zinc, and 30 ml Prostat daily via tube. Noted 200 ml free water every 8 hours ordered on 11/07/16. Complete TF regimen providing 1828 kcals, 93 grams, and 698 ml free water (1698 total free water). Regimen providing 100% of estimated kcal and protein needs.  Per CSW note, plan to d/c back to [facility] once medically stable.   Labs reviewed: Na: 146, CBGS: 188-233 (inpatient orders for glycemic control are 0-9 units insulin aspart every 4 hours and 10 units insulin glargine q HS).   Diet Order:  Diet NPO time specified Except for: Sips with Meds  Skin:  Wound (see comment) (open non pressure injury rt arm, DTI rt/lt heel)  Last BM:  11/07/16  Height:   Ht Readings from Last 1  Encounters:  11/06/16 _0  (1.651 m)    Weight:   Wt Readings from Last 1 Encounters:  11/08/16 137 lb 2 oz (62.2 kg)    Ideal Body Weight:  56.8 kg  BMI:  Body mass index is 22.82 kg/m.  Estimated Nutritional Needs:   Kcal:  1700-1900  Protein:  85-100 grams  Fluid:  1.7-1.9 L  EDUCATION NEEDS:   Education needs no appropriate at this time  Osamah Schmader A. Jimmye Norman, RD, LDN, CDE Pager: [phone] After hours Pager: [phone]

## 2016-11-09 ENCOUNTER — Inpatient Hospital Stay (HOSPITAL_COMMUNITY): Payer: Medicare Other

## 2016-11-09 LAB — GLUCOSE, CAPILLARY
Glucose-Capillary: 213 mg/dL — ABNORMAL HIGH (ref 65–99)
Glucose-Capillary: 218 mg/dL — ABNORMAL HIGH (ref 65–99)
Glucose-Capillary: 229 mg/dL — ABNORMAL HIGH (ref 65–99)
Glucose-Capillary: 230 mg/dL — ABNORMAL HIGH (ref 65–99)
Glucose-Capillary: 259 mg/dL — ABNORMAL HIGH (ref 65–99)
Glucose-Capillary: 316 mg/dL — ABNORMAL HIGH (ref 65–99)

## 2016-11-09 LAB — CBC
HEMATOCRIT: 31 % — AB (ref 36.0–46.0)
HEMOGLOBIN: 8.7 g/dL — AB (ref 12.0–15.0)
MCH: 23.8 pg — ABNORMAL LOW (ref 26.0–34.0)
MCHC: 28.1 g/dL — AB (ref 30.0–36.0)
MCV: 84.7 fL (ref 78.0–100.0)
Platelets: 208 10*3/uL (ref 150–400)
RBC: 3.66 MIL/uL — AB (ref 3.87–5.11)
RDW: 18.5 % — ABNORMAL HIGH (ref 11.5–15.5)
WBC: 11.2 10*3/uL — ABNORMAL HIGH (ref 4.0–10.5)

## 2016-11-09 LAB — MAGNESIUM: MAGNESIUM: 2 mg/dL (ref 1.7–2.4)

## 2016-11-09 LAB — BASIC METABOLIC PANEL
ANION GAP: 7 (ref 5–15)
Anion gap: 7 (ref 5–15)
BUN: 49 mg/dL — AB (ref 6–20)
BUN: 52 mg/dL — ABNORMAL HIGH (ref 6–20)
CALCIUM: 9.7 mg/dL (ref 8.9–10.3)
CHLORIDE: 107 mmol/L (ref 101–111)
CHLORIDE: 106 mmol/L (ref 101–111)
CO2: 29 mmol/L (ref 22–32)
CO2: 28 mmol/L (ref 22–32)
CREATININE: 1.42 mg/dL — AB (ref 0.44–1.00)
Calcium: 9.7 mg/dL (ref 8.9–10.3)
Creatinine, Ser: 1.43 mg/dL — ABNORMAL HIGH (ref 0.44–1.00)
GFR calc Af Amer: 40 mL/min — ABNORMAL LOW (ref >60)
GFR, EST AFRICAN AMERICAN: 40 mL/min — AB (ref >60)
GFR, EST NON AFRICAN AMERICAN: 35 mL/min — AB (ref >60)
GFR, EST NON AFRICAN AMERICAN: 35 mL/min — AB (ref >60)
GLUCOSE: 231 mg/dL — AB (ref 65–99)
Glucose, Bld: 206 mg/dL — ABNORMAL HIGH (ref 65–99)
POTASSIUM: 3.4 mmol/L — AB (ref 3.5–5.1)
POTASSIUM: 4.1 mmol/L (ref 3.5–5.1)
SODIUM: 143 mmol/L (ref 135–145)
Sodium: 141 mmol/L (ref 135–145)

## 2016-11-09 MED ORDER — FUROSEMIDE 10 MG/ML IJ SOLN
20.0000 mg | Freq: Once | INTRAMUSCULAR | Status: AC
  Administered 2016-11-09: 20 mg via INTRAVENOUS
  Filled 2016-11-09: qty 2

## 2016-11-09 MED ORDER — ACETAMINOPHEN 160 MG/5ML PO SOLN: 650.0000 mg | Freq: Four times a day (QID) | ORAL | Status: DC | PRN

## 2016-11-09 MED ORDER — IPRATROPIUM-ALBUTEROL 0.5-2.5 (3) MG/3ML IN SOLN
3.0000 mL | Freq: Four times a day (QID) | RESPIRATORY_TRACT | Status: DC
  Administered 2016-11-09: 3 mL via RESPIRATORY_TRACT
  Filled 2016-11-09: qty 3

## 2016-11-09 MED ORDER — ACETAMINOPHEN 160 MG/5ML PO SOLN
650.0000 mg | Freq: Four times a day (QID) | ORAL | Status: DC | PRN
  Administered 2016-11-09: 650 mg via ORAL
  Filled 2016-11-09: qty 20.3

## 2016-11-09 MED ORDER — ACETAMINOPHEN 650 MG RE SUPP: 650.0000 mg | Freq: Four times a day (QID) | RECTAL | Status: DC | PRN

## 2016-11-09 MED ORDER — LEVALBUTEROL HCL 0.63 MG/3ML IN NEBU
0.6300 mg | INHALATION_SOLUTION | Freq: Four times a day (QID) | RESPIRATORY_TRACT | Status: DC
  Administered 2016-11-09 – 2016-11-10 (×4): 0.63 mg via RESPIRATORY_TRACT
  Filled 2016-11-09 (×4): qty 3

## 2016-11-09 MED ORDER — INSULIN GLARGINE 100 UNIT/ML ~~LOC~~ SOLN
14.0000 [IU] | Freq: Every day | SUBCUTANEOUS | Status: DC
  Administered 2016-11-10 – 2016-11-15 (×5): 14 [IU] via SUBCUTANEOUS
  Filled 2016-11-09 (×6): qty 0.14

## 2016-11-09 NOTE — Progress Notes (Signed)
PROGRESS NOTE    Casey [name]  ZOX:[id number] DOB: Jun 11, 1940 DOA: 11/03/2016 PCP: Gildardo Cranker, DO   Brief Narrative: Casey [name] is a 76 y.o. female history of CVA, dementia, chronic kidney disease, seizures disorder, dementia, chronic systolic CHF, type 2 diabetes, chronic atrial fibrillation. She presented with altered mental status with concern for a UTI and had associated atrial fibrillation with RVR. Completed Antibiotics. Now primary issue is volume overload   Assessment & Plan:     Aspiration pneumonia Chest x-ray significant for atelectasis vs pneumonia. Urine culture negative for bacteria. -blood culture no growth for final report Change to levaquin, penn allergy. Completed 7 days of antibiotic course. remains at recurrent risk for aspiration  Moderate malnutrition PEG dependent Dysphagia Nepro tube feeding via PEG tube Pro-stat ordered per RD  Atrial fibrillation with RVR Resolved on cardizem drip -continue Cardizem PO  Acute metabolic encephalopathy Secondary to infection. Appears to be at baseline.  CKD stage III Stable  Hypernatremia Likely secondary to poor free water intake. S/p IV fluids Currently on free water 200 mg every 8 hours. Sodium improved with addition of D5, we'll increase the frequency of free water. Treated with d5 now resolved.  Diabetes mellitus, type 2 -SSI q4 hours -increase to Lantus 10 units  Upper airway wheeze Nasotracheal sunctioning PRN xopenex  Acute on chronic systolic CHF. Patient was given aggressive IV hydration during the course of hospitalization Now 13 L positive. We'll start with IV Lasix.  DVT prophylaxis: Heparin Code Status: Full code Family Communication: Attempted to reach the daughter on phone, left a voicemail Disposition Plan: Discharge to SNF, possibly tomorrow   Consultants:   PCCM  Procedures:   None  Antimicrobials:  Ceftriaxone    Subjective: No acute events, denies  any acute complaints of pain anywhere and nausea no breathing issues.  Objective: Vitals:   11/09/16 0420 11/09/16 0500 11/09/16 0920 11/09/16 1500  BP: 125/65  (!) 157/66 (!) 149/56  Pulse: 100   93  Resp: (!) 32     Temp: 99.1 F (37.3 C)   98.9 F (37.2 C)  TempSrc: Oral   Oral  SpO2: 99%   97%  Weight:  64.8 kg (142 lb 13.7 oz)    Height:        Intake/Output Summary (Last 24 hours) at 11/09/16 1748 Last data filed at 11/09/16 0234  Gross per 24 hour  Intake          3172.25 ml  Output                0 ml  Net          3172.25 ml   Filed Weights   11/06/16 1604 11/08/16 0500 11/09/16 0500  Weight: 64 kg (141 lb 1.5 oz) 62.2 kg (137 lb 2 oz) 64.8 kg (142 lb 13.7 oz)    Examination:  General exam: Appears calm and comfortable.  Respiratory system: Clear to auscultation. Respiratory effort normal. Cardiovascular system: S1 & S2 heard, RRR. No murmurs. Gastrointestinal system: Abdomen is nondistended, soft and nontender. Normal bowel sounds heard. Central nervous system: Alert and oriented to person and place. Right hemiparesis Extremities: No edema. No calf tenderness Skin: No cyanosis. No rashes  Data Reviewed: I have personally reviewed following labs and imaging studies  CBC:  Recent Labs Lab 11/03/16 2219  11/04/16 1930 11/05/16 0227 11/07/16 0351 11/08/16 0611 11/09/16 0538  WBC 10.1  < > 9.2 9.2 9.6 10.6* 11.2*  NEUTROABS 8.8*  --   --   --   --  8.1*  --   HGB 9.5*  < > 9.6* 9.4* 8.5* 9.1* 8.7*  HCT 31.7*  < > 31.8* 30.8* 30.4* 32.6* 31.0*  MCV 84.8  < > 85.5 84.6 85.4 85.3 84.7  PLT 333  < > 223 PLATELET CLUMPS NOTED ON SMEAR, COUNT APPEARS ADEQUATE 269 248 208  < > = values in this interval not displayed. Basic Metabolic Panel:  Recent Labs Lab 11/06/16 1113 11/07/16 0351 11/08/16 0611 11/08/16 1437 11/09/16 0538  NA 147* 151* 150* 146* 143  K 3.5 3.4* 3.3* 4.0 3.4*  CL 109 110 113* 111 107  CO2 28 30 27 26 29   GLUCOSE 213* 208* 186*  273* 206*  BUN 63* 65* 59* 56* 49*  CREATININE 2.02* 2.09* 1.72* 1.58* 1.42*  CALCIUM 9.7 9.7 10.0 9.6 9.7  MG  --   --  2.3  --  2.0   GFR: Estimated Creatinine Clearance: 30.3 mL/min (A) (by C-G formula based on SCr of 1.42 mg/dL (H)). Liver Function Tests:  Recent Labs Lab 11/03/16 2219 11/08/16 0611  AST 26 21  ALT 17 21  ALKPHOS 115 113  BILITOT 0.5 0.5  PROT 7.9 6.6  ALBUMIN 2.8* 2.4*   No results for input(s): LIPASE, AMYLASE in the last 168 hours. No results for input(s): AMMONIA in the last 168 hours. Coagulation Profile:  Recent Labs Lab 11/03/16 2219  INR 1.32   Cardiac Enzymes:  Recent Labs Lab 11/04/16 0217  TROPONINI 0.29*   BNP (last 3 results) No results for input(s): PROBNP in the last 8760 hours. HbA1C: No results for input(s): HGBA1C in the last 72 hours. CBG:  Recent Labs Lab 11/08/16 2008 11/09/16 0024 11/09/16 0418 11/09/16 0804 11/09/16 1205  GLUCAP 257* 229* 218* 213* 230*   Lipid Profile: No results for input(s): CHOL, HDL, LDLCALC, TRIG, CHOLHDL, LDLDIRECT in the last 72 hours. Thyroid Function Tests: No results for input(s): TSH, T4TOTAL, FREET4, T3FREE, THYROIDAB in the last 72 hours. Anemia Panel: No results for input(s): VITAMINB12, FOLATE, FERRITIN, TIBC, IRON, RETICCTPCT in the last 72 hours. Sepsis Labs:  Recent Labs Lab 11/03/16 2225 11/04/16 0025 11/04/16 0413  PROCALCITON  --  0.46  --   LATICACIDVEN 3.65* 3.0* 2.7*    Recent Results (from the past 240 hour(s))  Culture, blood (Routine x 2)     Status: None   Collection Time: 11/03/16 10:01 PM  Result Value Ref Range Status   Specimen Description BLOOD RIGHT ANTECUBITAL  Final   Special Requests IN PEDIATRIC BOTTLE Blood Culture adequate volume  Final   Culture NO GROWTH 5 DAYS  Final   Report Status 11/08/2016 FINAL  Final  Culture, blood (Routine x 2)     Status: None   Collection Time: 11/03/16 10:10 PM  Result Value Ref Range Status   Specimen  Description BLOOD RIGHT HAND  Final   Special Requests IN PEDIATRIC BOTTLE Blood Culture adequate volume  Final   Culture NO GROWTH 5 DAYS  Final   Report Status 11/08/2016 FINAL  Final  Urine Culture     Status: None   Collection Time: 11/03/16 10:36 PM  Result Value Ref Range Status   Specimen Description URINE, CATHETERIZED  Final   Special Requests NONE  Final   Culture NO GROWTH  Final   Report Status 11/05/2016 FINAL  Final  MRSA PCR Screening     Status: None   Collection Time: 11/04/16  4:40 AM  Result Value Ref Range Status   MRSA by  PCR NEGATIVE NEGATIVE Final    Comment:        The GeneXpert MRSA Assay (FDA approved for NASAL specimens only), is one component of a comprehensive MRSA colonization surveillance program. It is not intended to diagnose MRSA infection nor to guide or monitor treatment for MRSA infections.          Radiology Studies: Dg Chest Port 1 View  Result Date: 11/09/2016 CLINICAL DATA:  Shortness of breath, wheezing EXAM: PORTABLE CHEST 1 VIEW COMPARISON:  11/05/2016 FINDINGS: Cardiomegaly with vascular congestion. Bilateral lower lobe airspace opacities and effusions. Interstitial prominence, likely interstitial edema. IMPRESSION: Changes of CHF.  Small bilateral pleural effusions. Electronically Signed   By: Rolm Baptise M.D.   On: 11/09/2016 11:13        Scheduled Meds: . allopurinol  100 mg Per Tube Daily  . amantadine  100 mg Per Tube BID  . atorvastatin  10 mg Per Tube QHS  . diltiazem  30 mg Per Tube Q6H  . donepezil  10 mg Per Tube QHS  . feeding supplement (PRO-STAT SUGAR FREE 64)  30 mL Per Tube Daily  . ferrous sulfate  300 mg Per Tube BID WC  . free water  200 mL Per Tube Q6H  . heparin subcutaneous  5,000 Units Subcutaneous Q8H  . insulin aspart  0-9 Units Subcutaneous Q4H  . insulin glargine  10 Units Subcutaneous QHS  . levalbuterol  0.63 mg Nebulization Q6H  . levETIRAcetam  250 mg Oral BID  . metoprolol tartrate   50 mg Per Tube BID  . mirtazapine  7.5 mg Per Tube QHS  . multivitamin with minerals  1 tablet Per Tube Daily  . pantoprazole  40 mg Intravenous Q12H  . zinc sulfate  220 mg Per Tube QHS   Continuous Infusions: . sodium chloride    . feeding supplement (NEPRO CARB STEADY) 1,000 mL (11/08/16 1816)     LOS: 5 days     Author:  Berle Mull, MD [facility] Pager: [phone] 11/09/2016 5:48 PM     If 7PM-7AM, please contact night-coverage [email or web address] Password [email or web address]

## 2016-11-09 NOTE — Progress Notes (Signed)
Inpatient Diabetes Program Recommendations  AACE/ADA: New Consensus Statement on Inpatient Glycemic Control (2015)  Target Ranges:  Prepandial:   less than 140 mg/dL      Peak postprandial:   less than 180 mg/dL (1-2 hours)      Critically ill patients:  140 - 180 mg/dL   Lab Results  Component Value Date   GLUCAP 213 (H) 11/09/2016   HGBA1C 6.8 (H) 11/04/2016    Review of Glycemic Control Results for Casey [name], Casey [name] (MRN [id number]) as of 11/09/2016 10:26  Ref. Range 11/08/2016 17:06 11/08/2016 20:08 11/09/2016 00:24 11/09/2016 04:18 11/09/2016 08:04  Glucose-Capillary Latest Ref Range: 65 - 99 mg/dL 286 (H) 257 (H) 229 (H) 218 (H) 213 (H)   Inpatient Diabetes Program Recommendations:   CBG's > 200 with nepro started. Please consider: Novolog 2-3 units q 4 hrs for coverage of feeding and hold or stop if feedings stopped.  Thank you, Nani Gasser. Elex Mainwaring, RN, MSN, CDE  Diabetes Coordinator Inpatient Glycemic Control Team Team Pager [phone] (8am-5pm) 11/09/2016 10:27 AM

## 2016-11-10 LAB — BASIC METABOLIC PANEL
ANION GAP: 7 (ref 5–15)
BUN: 53 mg/dL — ABNORMAL HIGH (ref 6–20)
CALCIUM: 9.9 mg/dL (ref 8.9–10.3)
CHLORIDE: 108 mmol/L (ref 101–111)
CO2: 27 mmol/L (ref 22–32)
Creatinine, Ser: 1.54 mg/dL — ABNORMAL HIGH (ref 0.44–1.00)
GFR calc non Af Amer: 32 mL/min — ABNORMAL LOW (ref >60)
GFR, EST AFRICAN AMERICAN: 37 mL/min — AB (ref >60)
Glucose, Bld: 260 mg/dL — ABNORMAL HIGH (ref 65–99)
Potassium: 4.1 mmol/L (ref 3.5–5.1)
Sodium: 142 mmol/L (ref 135–145)

## 2016-11-10 LAB — CBC WITH DIFFERENTIAL/PLATELET
BASOS ABS: 0 10*3/uL (ref 0.0–0.1)
Basophils Relative: 0 %
EOS ABS: 0.4 10*3/uL (ref 0.0–0.7)
Eosinophils Relative: 3 %
HCT: 30.3 % — ABNORMAL LOW (ref 36.0–46.0)
HEMOGLOBIN: 8.7 g/dL — AB (ref 12.0–15.0)
LYMPHS ABS: 1.6 10*3/uL (ref 0.7–4.0)
LYMPHS PCT: 15 %
MCH: 24.2 pg — AB (ref 26.0–34.0)
MCHC: 28.7 g/dL — AB (ref 30.0–36.0)
MCV: 84.2 fL (ref 78.0–100.0)
Monocytes Absolute: 0.7 10*3/uL (ref 0.1–1.0)
Monocytes Relative: 6 %
NEUTROS PCT: 76 %
Neutro Abs: 8.4 10*3/uL — ABNORMAL HIGH (ref 1.7–7.7)
Platelets: 214 10*3/uL (ref 150–400)
RBC: 3.6 MIL/uL — AB (ref 3.87–5.11)
RDW: 18.4 % — AB (ref 11.5–15.5)
WBC: 11.1 10*3/uL — AB (ref 4.0–10.5)

## 2016-11-10 LAB — COMPREHENSIVE METABOLIC PANEL
ALBUMIN: 2.1 g/dL — AB (ref 3.5–5.0)
ALT: 15 U/L (ref 14–54)
AST: 42 U/L — AB (ref 15–41)
Alkaline Phosphatase: 99 U/L (ref 38–126)
Anion gap: 9 (ref 5–15)
BUN: 53 mg/dL — AB (ref 6–20)
CHLORIDE: 107 mmol/L (ref 101–111)
CO2: 26 mmol/L (ref 22–32)
CREATININE: 1.49 mg/dL — AB (ref 0.44–1.00)
Calcium: 9.5 mg/dL (ref 8.9–10.3)
GFR calc Af Amer: 38 mL/min — ABNORMAL LOW (ref >60)
GFR calc non Af Amer: 33 mL/min — ABNORMAL LOW (ref >60)
Glucose, Bld: 176 mg/dL — ABNORMAL HIGH (ref 65–99)
POTASSIUM: 3.8 mmol/L (ref 3.5–5.1)
SODIUM: 142 mmol/L (ref 135–145)
Total Bilirubin: 0.7 mg/dL (ref 0.3–1.2)
Total Protein: 6 g/dL — ABNORMAL LOW (ref 6.5–8.1)

## 2016-11-10 LAB — GLUCOSE, CAPILLARY
GLUCOSE-CAPILLARY: 174 mg/dL — AB (ref 65–99)
GLUCOSE-CAPILLARY: 186 mg/dL — AB (ref 65–99)
GLUCOSE-CAPILLARY: 204 mg/dL — AB (ref 65–99)
GLUCOSE-CAPILLARY: 216 mg/dL — AB (ref 65–99)
Glucose-Capillary: 162 mg/dL — ABNORMAL HIGH (ref 65–99)
Glucose-Capillary: 259 mg/dL — ABNORMAL HIGH (ref 65–99)
Glucose-Capillary: 240 mg/dL — ABNORMAL HIGH (ref 65–99)

## 2016-11-10 LAB — BRAIN NATRIURETIC PEPTIDE: B Natriuretic Peptide: 632.3 pg/mL — ABNORMAL HIGH (ref 0.0–100.0)

## 2016-11-10 LAB — MAGNESIUM
MAGNESIUM: 2 mg/dL (ref 1.7–2.4)
Magnesium: 2.1 mg/dL (ref 1.7–2.4)

## 2016-11-10 LAB — OSMOLALITY, URINE: Osmolality, Ur: 392 mOsm/kg (ref 300–900)

## 2016-11-10 MED ORDER — FUROSEMIDE 10 MG/ML IJ SOLN
20.0000 mg | Freq: Once | INTRAMUSCULAR | Status: AC
  Administered 2016-11-10: 20 mg via INTRAVENOUS
  Filled 2016-11-10: qty 2

## 2016-11-10 MED ORDER — PANTOPRAZOLE SODIUM 40 MG PO PACK
40.0000 mg | PACK | Freq: Every day | ORAL | Status: DC
  Administered 2016-11-10 – 2016-11-15 (×6): 40 mg
  Filled 2016-11-10 (×6): qty 20

## 2016-11-10 MED ORDER — LEVALBUTEROL HCL 0.63 MG/3ML IN NEBU
0.6300 mg | INHALATION_SOLUTION | Freq: Four times a day (QID) | RESPIRATORY_TRACT | Status: DC | PRN
  Administered 2016-11-11: 0.63 mg via RESPIRATORY_TRACT
  Filled 2016-11-10: qty 3

## 2016-11-10 MED ORDER — METOPROLOL TARTRATE 5 MG/5ML IV SOLN
2.5000 mg | Freq: Four times a day (QID) | INTRAVENOUS | Status: DC
  Administered 2016-11-10: 2.5 mg via INTRAVENOUS
  Filled 2016-11-10: qty 5

## 2016-11-10 MED ORDER — METOPROLOL TARTRATE 50 MG PO TABS
50.0000 mg | ORAL_TABLET | Freq: Two times a day (BID) | ORAL | Status: DC
  Administered 2016-11-10 – 2016-11-12 (×5): 50 mg
  Filled 2016-11-10 (×5): qty 1

## 2016-11-10 MED ORDER — CARVEDILOL 12.5 MG PO TABS
12.5000 mg | ORAL_TABLET | Freq: Two times a day (BID) | ORAL | Status: DC
  Administered 2016-11-10: 12.5 mg
  Filled 2016-11-10: qty 1

## 2016-11-10 MED ORDER — METOPROLOL TARTRATE 5 MG/5ML IV SOLN: 2.5000 mg | Freq: Four times a day (QID) | INTRAVENOUS | Status: DC | PRN

## 2016-11-10 MED ORDER — ALBUMIN HUMAN 25 % IV SOLN
25.0000 g | Freq: Once | INTRAVENOUS | Status: AC
  Administered 2016-11-10: 25 g via INTRAVENOUS
  Filled 2016-11-10: qty 50

## 2016-11-10 MED ORDER — FREE WATER
100.0000 mL | Freq: Four times a day (QID) | Status: DC
  Administered 2016-11-10 – 2016-11-15 (×21): 100 mL

## 2016-11-10 MED ORDER — AMIODARONE HCL 200 MG PO TABS
200.0000 mg | ORAL_TABLET | Freq: Two times a day (BID) | ORAL | Status: DC
  Administered 2016-11-10 – 2016-11-12 (×5): 200 mg
  Filled 2016-11-10 (×6): qty 1

## 2016-11-10 MED ORDER — FUROSEMIDE 10 MG/ML IJ SOLN
40.0000 mg | Freq: Once | INTRAMUSCULAR | Status: AC
  Administered 2016-11-10: 40 mg via INTRAVENOUS
  Filled 2016-11-10: qty 4

## 2016-11-10 NOTE — Progress Notes (Signed)
RN paged Jeannette Corpus, NP to make her aware of patients increased respirations of 40/ min, BP of 161/84 and HR 105, also made her aware of bibasilar crackles.  RN awaiting response.  P.J. Linus Mako, RN

## 2016-11-10 NOTE — Progress Notes (Signed)
PROGRESS NOTE    [name]  HYW:[id number] DOB: [birth year] DOA: 11/03/2016 PCP: Gildardo Cranker, DO   Brief Narrative: Casey [name] is a 76 y.o. female history of CVA, dementia, chronic kidney disease, seizures disorder, dementia, chronic systolic CHF, type 2 diabetes, chronic atrial fibrillation. She presented with altered mental status with concern for a UTI and had associated atrial fibrillation with RVR. Completed Antibiotics. Now primary issue is volume overload   Assessment & Plan: Aspiration pneumonia Chest x-ray significant for atelectasis vs pneumonia. Urine culture negative for bacteria. -blood culture no growth for final report Change to levaquin, penn allergy. Completed 7 days of antibiotic course. remains at recurrent risk for aspiration  Moderate malnutrition PEG dependent Dysphagia Nepro tube feeding via PEG tube Pro-stat ordered per RD  Atrial fibrillation with RVR Resolved on cardizem drip Cardizem has been contraindicated to be used in patients with systolic heart failure especially in acute decompensated 1. Given to the patient appears to be having acute on chronic systolic CHF I would discontinue Cardizem. Discussed with cardiology. Patient not a candidate for anticoagulation based on his last admission with GI bleed. Only option for rate control will be continuing IV Lopressor, continue oral Lopressor and starting the patient on amiodarone. Cardiology recommended to start the patient on 200 mg twice a day for one week and then transition to 200 mg daily. Cardiology reiterated their poor prognosis for the patient from last admission.  Acute metabolic encephalopathy Secondary to infection. Appears to be at baseline.  CKD stage III Stable  Hypernatremia Likely secondary to poor free water intake. S/p IV fluids Currently on free water 200 mg every 8 hours. Sodium improved with addition of D5, we'll increase the frequency of free water. Treated  with d5 now resolved.  Diabetes mellitus, type 2 -SSI q4 hours -increase to Lantus 10 units  Upper airway wheeze Nasotracheal sunctioning PRN xopenex  Acute on chronic systolic CHF. Patient was given aggressive IV hydration during the course of hospitalization Now 13 L positive. We'll start with IV Lasix.  DVT prophylaxis: Heparin Code Status: Full code Family Communication: Discussed with daughter on the phone. Disposition Plan: Discharge to SNF, possibly Monday.   Consultants:   PCCM  Procedures:   None  Antimicrobials:  Ceftriaxone    Subjective: Shortness of breath remains. No nausea no vomiting per patient. No abdominal pain no chest pain per patient. Tachycardic this morning.  Objective: Vitals:   11/10/16 0815 11/10/16 0845 11/10/16 1304 11/10/16 1400  BP:   125/80 125/85  Pulse:   (!) 121 (!) 124  Resp:   (!) 31 (!) 36  Temp:   98.6 F (37 C) 98.6 F (37 C)  TempSrc:   Oral Oral  SpO2: 97% 99% 100%   Weight:      Height:        Intake/Output Summary (Last 24 hours) at 11/10/16 1921 Last data filed at 11/10/16 1900  Gross per 24 hour  Intake             1880 ml  Output              750 ml  Net             1130 ml   Filed Weights   11/08/16 0500 11/09/16 0500 11/10/16 0500  Weight: 62.2 kg (137 lb 2 oz) 64.8 kg (142 lb 13.7 oz) 68.6 kg (151 lb 3.8 oz)    Examination:  General exam: Appears calm and comfortable.  Respiratory system: Clear to auscultation. Bilateral expiratory wheezing primarily upper airway. Cardiovascular system: S1 & S2 heard, RRR. No murmurs. Gastrointestinal system: Abdomen is nondistended, soft and nontender. Normal bowel sounds heard. Central nervous system: Alert and oriented to person and place. Right hemiparesis Extremities: No edema. No calf tenderness Skin: No cyanosis. No rashes  Data Reviewed: I have personally reviewed following labs and imaging studies  CBC:  Recent Labs Lab 11/03/16 2219   11/05/16 0227 11/07/16 0351 11/08/16 0611 11/09/16 0538 11/10/16 0939  WBC 10.1  < > 9.2 9.6 10.6* 11.2* 11.1*  NEUTROABS 8.8*  --   --   --  8.1*  --  8.4*  HGB 9.5*  < > 9.4* 8.5* 9.1* 8.7* 8.7*  HCT 31.7*  < > 30.8* 30.4* 32.6* 31.0* 30.3*  MCV 84.8  < > 84.6 85.4 85.3 84.7 84.2  PLT 333  < > PLATELET CLUMPS NOTED ON SMEAR, COUNT APPEARS ADEQUATE 269 248 208 214  < > = values in this interval not displayed. Basic Metabolic Panel:  Recent Labs Lab 11/08/16 0611 11/08/16 1437 11/09/16 0538 11/09/16 2203 11/10/16 0612 11/10/16 1531  NA 150* 146* 143 141 142 142  K 3.3* 4.0 3.4* 4.1 3.8 4.1  CL 113* 111 107 106 107 108  CO2 27 26 29 28 26 27   GLUCOSE 186* 273* 206* 231* 176* 260*  BUN 59* 56* 49* 52* 53* 53*  CREATININE 1.72* 1.58* 1.42* 1.43* 1.49* 1.54*  CALCIUM 10.0 9.6 9.7 9.7 9.5 9.9  MG 2.3  --  2.0  --  2.1 2.0   GFR: Estimated Creatinine Clearance: 30.2 mL/min (A) (by C-G formula based on SCr of 1.54 mg/dL (H)). Liver Function Tests:  Recent Labs Lab 11/03/16 2219 11/08/16 0611 11/10/16 0612  AST 26 21 42*  ALT 17 21 15   ALKPHOS 115 113 99  BILITOT 0.5 0.5 0.7  PROT 7.9 6.6 6.0*  ALBUMIN 2.8* 2.4* 2.1*   No results for input(s): LIPASE, AMYLASE in the last 168 hours. No results for input(s): AMMONIA in the last 168 hours. Coagulation Profile:  Recent Labs Lab 11/03/16 2219  INR 1.32   Cardiac Enzymes:  Recent Labs Lab 11/04/16 0217  TROPONINI 0.29*   BNP (last 3 results) No results for input(s): PROBNP in the last 8760 hours. HbA1C: No results for input(s): HGBA1C in the last 72 hours. CBG:  Recent Labs Lab 11/10/16 0409 11/10/16 0754 11/10/16 1307 11/10/16 1450 11/10/16 1712  GLUCAP 174* 162* 216* 259* 204*   Lipid Profile: No results for input(s): CHOL, HDL, LDLCALC, TRIG, CHOLHDL, LDLDIRECT in the last 72 hours. Thyroid Function Tests: No results for input(s): TSH, T4TOTAL, FREET4, T3FREE, THYROIDAB in the last 72  hours. Anemia Panel: No results for input(s): VITAMINB12, FOLATE, FERRITIN, TIBC, IRON, RETICCTPCT in the last 72 hours. Sepsis Labs:  Recent Labs Lab 11/03/16 2225 11/04/16 0025 11/04/16 0413  PROCALCITON  --  0.46  --   LATICACIDVEN 3.65* 3.0* 2.7*    Recent Results (from the past 240 hour(s))  Culture, blood (Routine x 2)     Status: None   Collection Time: 11/03/16 10:01 PM  Result Value Ref Range Status   Specimen Description BLOOD RIGHT ANTECUBITAL  Final   Special Requests IN PEDIATRIC BOTTLE Blood Culture adequate volume  Final   Culture NO GROWTH 5 DAYS  Final   Report Status 11/08/2016 FINAL  Final  Culture, blood (Routine x 2)     Status: None   Collection Time: 11/03/16  10:10 PM  Result Value Ref Range Status   Specimen Description BLOOD RIGHT HAND  Final   Special Requests IN PEDIATRIC BOTTLE Blood Culture adequate volume  Final   Culture NO GROWTH 5 DAYS  Final   Report Status 11/08/2016 FINAL  Final  Urine Culture     Status: None   Collection Time: 11/03/16 10:36 PM  Result Value Ref Range Status   Specimen Description URINE, CATHETERIZED  Final   Special Requests NONE  Final   Culture NO GROWTH  Final   Report Status 11/05/2016 FINAL  Final  MRSA PCR Screening     Status: None   Collection Time: 11/04/16  4:40 AM  Result Value Ref Range Status   MRSA by PCR NEGATIVE NEGATIVE Final    Comment:        The GeneXpert MRSA Assay (FDA approved for NASAL specimens only), is one component of a comprehensive MRSA colonization surveillance program. It is not intended to diagnose MRSA infection nor to guide or monitor treatment for MRSA infections.          Radiology Studies: Dg Chest Port 1 View  Result Date: 11/09/2016 CLINICAL DATA:  Shortness of breath, wheezing EXAM: PORTABLE CHEST 1 VIEW COMPARISON:  11/05/2016 FINDINGS: Cardiomegaly with vascular congestion. Bilateral lower lobe airspace opacities and effusions. Interstitial prominence, likely  interstitial edema. IMPRESSION: Changes of CHF.  Small bilateral pleural effusions. Electronically Signed   By: Rolm Baptise M.D.   On: 11/09/2016 11:13        Scheduled Meds: . allopurinol  100 mg Per Tube Daily  . amantadine  100 mg Per Tube BID  . amiodarone  200 mg Per Tube BID  . atorvastatin  10 mg Per Tube QHS  . donepezil  10 mg Per Tube QHS  . feeding supplement (PRO-STAT SUGAR FREE 64)  30 mL Per Tube Daily  . ferrous sulfate  300 mg Per Tube BID WC  . free water  100 mL Per Tube Q6H  . heparin subcutaneous  5,000 Units Subcutaneous Q8H  . insulin aspart  0-9 Units Subcutaneous Q4H  . insulin glargine  14 Units Subcutaneous QHS  . levETIRAcetam  250 mg Oral BID  . metoprolol tartrate  2.5 mg Intravenous Q6H  . metoprolol tartrate  50 mg Per Tube BID  . mirtazapine  7.5 mg Per Tube QHS  . multivitamin with minerals  1 tablet Per Tube Daily  . pantoprazole sodium  40 mg Per Tube Daily   Continuous Infusions: . sodium chloride    . feeding supplement (NEPRO CARB STEADY) 1,000 mL (11/09/16 1855)     LOS: 6 days     Author:  Berle Mull, MD [facility] Pager: [phone] 11/10/2016 7:21 PM     If 7PM-7AM, please contact night-coverage [email or web address] Password [email or web address]

## 2016-11-11 ENCOUNTER — Inpatient Hospital Stay (HOSPITAL_COMMUNITY): Payer: Medicare Other

## 2016-11-11 LAB — COMPREHENSIVE METABOLIC PANEL
ALBUMIN: 2.5 g/dL — AB (ref 3.5–5.0)
ALK PHOS: 110 U/L (ref 38–126)
ALT: 16 U/L (ref 14–54)
AST: 19 U/L (ref 15–41)
Anion gap: 9 (ref 5–15)
BUN: 51 mg/dL — ABNORMAL HIGH (ref 6–20)
CALCIUM: 10.4 mg/dL — AB (ref 8.9–10.3)
CO2: 30 mmol/L (ref 22–32)
CREATININE: 1.72 mg/dL — AB (ref 0.44–1.00)
Chloride: 105 mmol/L (ref 101–111)
GFR calc non Af Amer: 28 mL/min — ABNORMAL LOW (ref >60)
GFR, EST AFRICAN AMERICAN: 32 mL/min — AB (ref >60)
GLUCOSE: 156 mg/dL — AB (ref 65–99)
Potassium: 3.4 mmol/L — ABNORMAL LOW (ref 3.5–5.1)
SODIUM: 144 mmol/L (ref 135–145)
Total Bilirubin: 0.7 mg/dL (ref 0.3–1.2)
Total Protein: 7.4 g/dL (ref 6.5–8.1)

## 2016-11-11 LAB — CBC WITH DIFFERENTIAL/PLATELET
Basophils Absolute: 0.1 10*3/uL (ref 0.0–0.1)
Basophils Relative: 1 %
EOS ABS: 0.4 10*3/uL (ref 0.0–0.7)
Eosinophils Relative: 4 %
HCT: 31.7 % — ABNORMAL LOW (ref 36.0–46.0)
HEMOGLOBIN: 9 g/dL — AB (ref 12.0–15.0)
LYMPHS ABS: 1.1 10*3/uL (ref 0.7–4.0)
LYMPHS PCT: 12 %
MCH: 23.8 pg — AB (ref 26.0–34.0)
MCHC: 28.4 g/dL — ABNORMAL LOW (ref 30.0–36.0)
MCV: 83.9 fL (ref 78.0–100.0)
Monocytes Absolute: 0.8 10*3/uL (ref 0.1–1.0)
Monocytes Relative: 8 %
NEUTROS PCT: 75 %
Neutro Abs: 6.9 10*3/uL (ref 1.7–7.7)
Platelets: 245 10*3/uL (ref 150–400)
RBC: 3.78 MIL/uL — AB (ref 3.87–5.11)
RDW: 18.3 % — ABNORMAL HIGH (ref 11.5–15.5)
WBC: 9.1 10*3/uL (ref 4.0–10.5)

## 2016-11-11 LAB — GLUCOSE, CAPILLARY
Glucose-Capillary: 166 mg/dL — ABNORMAL HIGH (ref 65–99)
Glucose-Capillary: 167 mg/dL — ABNORMAL HIGH (ref 65–99)
Glucose-Capillary: 199 mg/dL — ABNORMAL HIGH (ref 65–99)
Glucose-Capillary: 217 mg/dL — ABNORMAL HIGH (ref 65–99)
Glucose-Capillary: 236 mg/dL — ABNORMAL HIGH (ref 65–99)
Glucose-Capillary: 238 mg/dL — ABNORMAL HIGH (ref 65–99)
Glucose-Capillary: 264 mg/dL — ABNORMAL HIGH (ref 65–99)

## 2016-11-11 LAB — BLOOD GAS, ARTERIAL
ACID-BASE EXCESS: 6.3 mmol/L — AB (ref 0.0–2.0)
BICARBONATE: 30.9 mmol/L — AB (ref 20.0–28.0)
Drawn by: 345601
O2 Content: 2 L/min
O2 Saturation: 96.2 %
PCO2 ART: 48.8 mmHg — AB (ref 32.0–48.0)
PH ART: 7.417 (ref 7.350–7.450)
PO2 ART: 82.8 mmHg — AB (ref 83.0–108.0)
Patient temperature: 98.2

## 2016-11-11 LAB — MAGNESIUM: Magnesium: 2.1 mg/dL (ref 1.7–2.4)

## 2016-11-11 MED ORDER — FUROSEMIDE 10 MG/ML IJ SOLN
40.0000 mg | Freq: Once | INTRAMUSCULAR | Status: AC
  Administered 2016-11-11: 40 mg via INTRAVENOUS
  Filled 2016-11-11: qty 4

## 2016-11-11 MED ORDER — LORAZEPAM 2 MG/ML IJ SOLN
0.5000 mg | Freq: Once | INTRAMUSCULAR | Status: AC
  Administered 2016-11-11: 0.5 mg via INTRAVENOUS
  Filled 2016-11-11: qty 1

## 2016-11-11 MED ORDER — BUMETANIDE 1 MG PO TABS: 1.0000 mg | ORAL_TABLET | Freq: Every day | ORAL | Status: DC

## 2016-11-11 MED ORDER — TORSEMIDE 20 MG PO TABS
20.0000 mg | ORAL_TABLET | Freq: Every day | ORAL | Status: DC
  Administered 2016-11-11 – 2016-11-12 (×2): 20 mg via ORAL
  Filled 2016-11-11 (×2): qty 1

## 2016-11-11 MED ORDER — FUROSEMIDE 10 MG/ML IJ SOLN
40.0000 mg | Freq: Once | INTRAMUSCULAR | Status: DC
Start: 2016-11-11 — End: 2016-11-11

## 2016-11-11 MED ORDER — TORSEMIDE 20 MG PO TABS
20.0000 mg | ORAL_TABLET | Freq: Once | ORAL | Status: AC
  Administered 2016-11-11: 20 mg via ORAL
  Filled 2016-11-11: qty 1

## 2016-11-11 NOTE — Progress Notes (Signed)
RN notified Casey Corpus, NP that patient has not had urine output since Lasix 40 mg IV given, also notified Magda Paganini with RR and Magda Paganini suggested chest x-ray.  RN inquired if NP wanted to order CXR, awaiting response. Patient still having respirations of 32/ minute.  RN will continue to monitor patient and notify NP and RR as needed.  P.J. Linus Mako, RN

## 2016-11-11 NOTE — Progress Notes (Signed)
PROGRESS NOTE    Casey [name]  RJJ:[id number] DOB: 11-02-1940 DOA: 11/03/2016 PCP: Gildardo Cranker, DO   Brief Narrative: Casey [name] is a 76 y.o. female history of CVA, dementia, chronic kidney disease, seizures disorder, dementia, chronic systolic CHF, type 2 diabetes, chronic atrial fibrillation. She presented with altered mental status with concern for a UTI and had associated atrial fibrillation with RVR. Completed Antibiotics. Now primary issue is volume overload   Assessment & Plan: Aspiration pneumonia Chest x-ray significant for atelectasis vs pneumonia. Urine culture negative for bacteria. -blood culture no growth for final report Change to levaquin, penn allergy. Completed 7 days of antibiotic course. remains at recurrent risk for aspiration  Moderate malnutrition PEG dependent Dysphagia Nepro tube feeding via PEG tube Pro-stat ordered per RD  Atrial fibrillation with RVR Resolved on cardizem drip Cardizem has been contraindicated to be used in patients with systolic heart failure especially in acute decompensated 1. Given to the patient appears to be having acute on chronic systolic CHF I would discontinue Cardizem. Discussed with cardiology. Patient not a candidate for anticoagulation based on his last admission with GI bleed. Only option for rate control will be continuing IV Lopressor, continue oral Lopressor and starting the patient on amiodarone. Cardiology recommended to start the patient on 200 mg twice a day for one week and then transition to 200 mg daily. Cardiology reiterated their poor prognosis for the patient from last admission. Discuss with daughter on phone that amiodarone may put pt at higher risk of stroke. Heart rate appears to be well controlled. Will continue with current regimen on Lopressor and amiodarone with IV Lopressor when necessary.  Acute metabolic encephalopathy Secondary to infection. Appears to be at baseline.  CKD stage  III Stable  Hypernatremia Likely secondary to poor free water intake. S/p IV fluids Currently on free water 200 mg every 8 hours. Sodium improved with addition of D5, we'll increase the frequency of free water. Treated with d5 now resolved.  Diabetes mellitus, type 2 -SSI q4 hours -increase to Lantus 10 units  Upper airway wheeze Nasotracheal sunctioning PRN xopenex  Acute on chronic systolic CHF. Patient was given aggressive IV hydration during the course of hospitalization Now 13 L positive. IV Lasix has not been effective, we will try torsemide and monitor.   DVT prophylaxis: Heparin Code Status: Full code Family Communication: Discussed with daughter on the phone. Disposition Plan: Discharge to SNF, possibly Monday.   Consultants:   PCCM  Procedures:   None  Antimicrobials:  Ceftriaxone    Subjective: Patient initially was calm and comfortable. Her breathing was also calm. When I walk close to her she became tachypneic. Initially she was not responding but later on she did answered all the questions. Denies any shortness of breath. Still feeling about the same. No nausea no vomiting. No pain anywhere. Overnight had breathing issues and required IV Lasix.  Objective: Vitals:   11/11/16 0719 11/11/16 0834 11/11/16 1009 11/11/16 1327  BP:   133/64 (!) 123/55  Pulse:    (!) 122  Resp:    (!) 22  Temp:    98.6 F (37 C)  TempSrc:    Oral  SpO2:  94%  97%  Weight: 67.1 kg (148 lb)     Height:        Intake/Output Summary (Last 24 hours) at 11/11/16 1549 Last data filed at 11/11/16 1200  Gross per 24 hour  Intake  50 ml  Output             1069 ml  Net            -1019 ml   Filed Weights   11/09/16 0500 11/10/16 0500 11/11/16 0719  Weight: 64.8 kg (142 lb 13.7 oz) 68.6 kg (151 lb 3.8 oz) 67.1 kg (148 lb)    Examination:  General exam: Appears calm and comfortable.  Respiratory system: Clear to auscultation. Bilateral expiratory  wheezing primarily upper airway. Cardiovascular system: S1 & S2 heard, RRR. No murmurs. Gastrointestinal system: Abdomen is nondistended, soft and nontender. Normal bowel sounds heard. Central nervous system: Alert and oriented to person and place. Right hemiparesis Extremities: No edema. No calf tenderness Skin: No cyanosis. No rashes  Data Reviewed: I have personally reviewed following labs and imaging studies  CBC:  Recent Labs Lab 11/07/16 0351 11/08/16 0611 11/09/16 0538 11/10/16 0939 11/11/16 0625  WBC 9.6 10.6* 11.2* 11.1* 9.1  NEUTROABS  --  8.1*  --  8.4* 6.9  HGB 8.5* 9.1* 8.7* 8.7* 9.0*  HCT 30.4* 32.6* 31.0* 30.3* 31.7*  MCV 85.4 85.3 84.7 84.2 83.9  PLT 269 248 208 214 867   Basic Metabolic Panel:  Recent Labs Lab 11/08/16 0611  11/09/16 0538 11/09/16 2203 11/10/16 0612 11/10/16 1531 11/11/16 0625  NA 150*  < > 143 141 142 142 144  K 3.3*  < > 3.4* 4.1 3.8 4.1 3.4*  CL 113*  < > 107 106 107 108 105  CO2 27  < > 29 28 26 27 30   GLUCOSE 186*  < > 206* 231* 176* 260* 156*  BUN 59*  < > 49* 52* 53* 53* 51*  CREATININE 1.72*  < > 1.42* 1.43* 1.49* 1.54* 1.72*  CALCIUM 10.0  < > 9.7 9.7 9.5 9.9 10.4*  MG 2.3  --  2.0  --  2.1 2.0 2.1  < > = values in this interval not displayed. GFR: Estimated Creatinine Clearance: 25 mL/min (A) (by C-G formula based on SCr of 1.72 mg/dL (H)). Liver Function Tests:  Recent Labs Lab 11/08/16 0611 11/10/16 0612 11/11/16 0625  AST 21 42* 19  ALT 21 15 16   ALKPHOS 113 99 110  BILITOT 0.5 0.7 0.7  PROT 6.6 6.0* 7.4  ALBUMIN 2.4* 2.1* 2.5*   No results for input(s): LIPASE, AMYLASE in the last 168 hours. No results for input(s): AMMONIA in the last 168 hours. Coagulation Profile: No results for input(s): INR, PROTIME in the last 168 hours. Cardiac Enzymes: No results for input(s): CKTOTAL, CKMB, CKMBINDEX, TROPONINI in the last 168 hours. BNP (last 3 results) No results for input(s): PROBNP in the last 8760  hours. HbA1C: No results for input(s): HGBA1C in the last 72 hours. CBG:  Recent Labs Lab 11/10/16 2000 11/11/16 0000 11/11/16 0354 11/11/16 0805 11/11/16 1143  GLUCAP 240* 264* 199* 166* 238*   Lipid Profile: No results for input(s): CHOL, HDL, LDLCALC, TRIG, CHOLHDL, LDLDIRECT in the last 72 hours. Thyroid Function Tests: No results for input(s): TSH, T4TOTAL, FREET4, T3FREE, THYROIDAB in the last 72 hours. Anemia Panel: No results for input(s): VITAMINB12, FOLATE, FERRITIN, TIBC, IRON, RETICCTPCT in the last 72 hours. Sepsis Labs: No results for input(s): PROCALCITON, LATICACIDVEN in the last 168 hours.  Recent Results (from the past 240 hour(s))  Culture, blood (Routine x 2)     Status: None   Collection Time: 11/03/16 10:01 PM  Result Value Ref Range Status   Specimen Description  BLOOD RIGHT ANTECUBITAL  Final   Special Requests IN PEDIATRIC BOTTLE Blood Culture adequate volume  Final   Culture NO GROWTH 5 DAYS  Final   Report Status 11/08/2016 FINAL  Final  Culture, blood (Routine x 2)     Status: None   Collection Time: 11/03/16 10:10 PM  Result Value Ref Range Status   Specimen Description BLOOD RIGHT HAND  Final   Special Requests IN PEDIATRIC BOTTLE Blood Culture adequate volume  Final   Culture NO GROWTH 5 DAYS  Final   Report Status 11/08/2016 FINAL  Final  Urine Culture     Status: None   Collection Time: 11/03/16 10:36 PM  Result Value Ref Range Status   Specimen Description URINE, CATHETERIZED  Final   Special Requests NONE  Final   Culture NO GROWTH  Final   Report Status 11/05/2016 FINAL  Final  MRSA PCR Screening     Status: None   Collection Time: 11/04/16  4:40 AM  Result Value Ref Range Status   MRSA by PCR NEGATIVE NEGATIVE Final    Comment:        The GeneXpert MRSA Assay (FDA approved for NASAL specimens only), is one component of a comprehensive MRSA colonization surveillance program. It is not intended to diagnose MRSA infection nor to  guide or monitor treatment for MRSA infections.          Radiology Studies: Dg Chest Port 1 View  Result Date: 11/11/2016 CLINICAL DATA:  Dyspnea EXAM: PORTABLE CHEST 1 VIEW COMPARISON:  11/09/2016 FINDINGS: Marked cardiomegaly. Central airspace opacities likely represent edema. Small pleural effusions bilaterally. Moderate vascular and interstitial prominence. Little or no interval change from the prior. IMPRESSION: The findings likely represent congestive heart failure with interstitial and alveolar edema as well as bilateral effusions. Electronically Signed   By: Andreas Newport M.D.   On: 11/11/2016 03:09        Scheduled Meds: . allopurinol  100 mg Per Tube Daily  . amantadine  100 mg Per Tube BID  . amiodarone  200 mg Per Tube BID  . atorvastatin  10 mg Per Tube QHS  . donepezil  10 mg Per Tube QHS  . feeding supplement (PRO-STAT SUGAR FREE 64)  30 mL Per Tube Daily  . ferrous sulfate  300 mg Per Tube BID WC  . free water  100 mL Per Tube Q6H  . heparin subcutaneous  5,000 Units Subcutaneous Q8H  . insulin aspart  0-9 Units Subcutaneous Q4H  . insulin glargine  14 Units Subcutaneous QHS  . levETIRAcetam  250 mg Oral BID  . metoprolol tartrate  50 mg Per Tube BID  . mirtazapine  7.5 mg Per Tube QHS  . multivitamin with minerals  1 tablet Per Tube Daily  . pantoprazole sodium  40 mg Per Tube Daily  . torsemide  20 mg Oral Daily   Continuous Infusions: . sodium chloride    . feeding supplement (NEPRO CARB STEADY) 1,000 mL (11/11/16 0202)     LOS: 7 days     Author:  Berle Mull, MD [facility] Pager: [phone] 11/11/2016 3:49 PM     If 7PM-7AM, please contact night-coverage [email or web address] Password [email or web address]

## 2016-11-11 NOTE — Progress Notes (Signed)
RN called for increased RR 40's, bibasilar crackles, and diaphoretic. [place] NP paged PTA with new orders given for Lasix IVP 40 mg. Will continue to monitor. Advised RN to call as needed. No intervention from this RN  Pt alert to slef which is her norm per RN. BP 103/68, HR 103, RR 32, 100% 2 L [place] 0230 follow up with pt, RR mid 20's to low 30's, RN reports no urine output since receiving Lasix. CXR order. Advised RN to page [facility] NP with results.

## 2016-11-11 NOTE — Progress Notes (Signed)
Patient having respirations of 44/minute, sweating, daughter at bedside who alerted nurse of patient's labored breathing. RN notified RR and also called Jeannette Corpus, NP to make them both aware. Awaiting response from NP. RN will continue to monitor patient and make NP aware of any changes.  P.J. Linus Mako, RN

## 2016-11-11 NOTE — Progress Notes (Signed)
RN notified Rush Landmark, NP that patient continues to have elevated respirations of 36-40, HR is a fib with rates in low 100's to 130's, most recent BP 103/65 and 02 sat 100% on 2 liters per [place].  RN also made NP aware that lungs have fine bibasilar crackles and patient has broken out in a sweat.  Patient's CBG 264.  RN also informed NP that RN contacted [facility] for additional support and Magda Paganini, Therapist, sports responded. Order for Lasix 40 mg received.  Magda Paganini also contacted RT for PRN Xopenex treatment.  No other orders received at this time, as NP states patient has lab work due in a.m., RN will continue to monitor patient and report any issues to NP immediately.  P.J. Linus Mako, RN

## 2016-11-12 ENCOUNTER — Inpatient Hospital Stay (HOSPITAL_COMMUNITY): Payer: Medicare Other

## 2016-11-12 DIAGNOSIS — M7989 Other specified soft tissue disorders: Secondary | ICD-10-CM

## 2016-11-12 LAB — GLUCOSE, CAPILLARY
GLUCOSE-CAPILLARY: 221 mg/dL — AB (ref 65–99)
GLUCOSE-CAPILLARY: 207 mg/dL — AB (ref 65–99)
Glucose-Capillary: 198 mg/dL — ABNORMAL HIGH (ref 65–99)
Glucose-Capillary: 184 mg/dL — ABNORMAL HIGH (ref 65–99)
Glucose-Capillary: 237 mg/dL — ABNORMAL HIGH (ref 65–99)

## 2016-11-12 LAB — CBC
HCT: 30.6 % — ABNORMAL LOW (ref 36.0–46.0)
Hemoglobin: 8.6 g/dL — ABNORMAL LOW (ref 12.0–15.0)
MCH: 23.8 pg — AB (ref 26.0–34.0)
MCHC: 28.1 g/dL — ABNORMAL LOW (ref 30.0–36.0)
MCV: 84.8 fL (ref 78.0–100.0)
PLATELETS: 216 10*3/uL (ref 150–400)
RBC: 3.61 MIL/uL — AB (ref 3.87–5.11)
RDW: 18.3 % — AB (ref 11.5–15.5)
WBC: 8 10*3/uL (ref 4.0–10.5)

## 2016-11-12 LAB — BASIC METABOLIC PANEL
ANION GAP: 8 (ref 5–15)
Anion gap: 9 (ref 5–15)
BUN: 62 mg/dL — AB (ref 6–20)
BUN: 60 mg/dL — ABNORMAL HIGH (ref 6–20)
CALCIUM: 10.1 mg/dL (ref 8.9–10.3)
CHLORIDE: 104 mmol/L (ref 101–111)
CO2: 31 mmol/L (ref 22–32)
CO2: 31 mmol/L (ref 22–32)
CREATININE: 2.03 mg/dL — AB (ref 0.44–1.00)
Calcium: 10 mg/dL (ref 8.9–10.3)
Chloride: 104 mmol/L (ref 101–111)
Creatinine, Ser: 1.89 mg/dL — ABNORMAL HIGH (ref 0.44–1.00)
GFR calc Af Amer: 26 mL/min — ABNORMAL LOW (ref >60)
GFR calc non Af Amer: 25 mL/min — ABNORMAL LOW (ref >60)
GFR, EST AFRICAN AMERICAN: 29 mL/min — AB (ref >60)
GFR, EST NON AFRICAN AMERICAN: 23 mL/min — AB (ref >60)
GLUCOSE: 205 mg/dL — AB (ref 65–99)
Glucose, Bld: 232 mg/dL — ABNORMAL HIGH (ref 65–99)
POTASSIUM: 3.5 mmol/L (ref 3.5–5.1)
POTASSIUM: 3.4 mmol/L — AB (ref 3.5–5.1)
SODIUM: 144 mmol/L (ref 135–145)
SODIUM: 143 mmol/L (ref 135–145)

## 2016-11-12 LAB — MAGNESIUM
MAGNESIUM: 2 mg/dL (ref 1.7–2.4)
MAGNESIUM: 2 mg/dL (ref 1.7–2.4)

## 2016-11-12 MED ORDER — FUROSEMIDE 10 MG/ML IJ SOLN: 80.0000 mg | Freq: Two times a day (BID) | INTRAMUSCULAR | Status: DC

## 2016-11-12 MED ORDER — POTASSIUM CHLORIDE 10 MEQ/100ML IV SOLN
10.0000 meq | INTRAVENOUS | Status: AC
  Administered 2016-11-12 (×4): 10 meq via INTRAVENOUS
  Filled 2016-11-12 (×3): qty 100

## 2016-11-12 MED ORDER — BUMETANIDE 0.25 MG/ML IJ SOLN
0.5000 mg | Freq: Once | INTRAMUSCULAR | Status: AC
  Administered 2016-11-12: 0.5 mg via INTRAVENOUS
  Filled 2016-11-12: qty 2

## 2016-11-12 MED ORDER — POTASSIUM CHLORIDE 10 MEQ/100ML IV SOLN
INTRAVENOUS | Status: AC
  Filled 2016-11-12: qty 100

## 2016-11-12 NOTE — Care Management Important Message (Signed)
Important Message  Patient Details  Name: Casey [name] MRN: [id number] Date of Birth: 1940/04/03   Medicare Important Message Given:  Yes    Donnette Macmullen [place] 11/12/2016, 10:59 AM

## 2016-11-12 NOTE — Procedures (Signed)
ELECTROENCEPHALOGRAM REPORT  Date of Study: 11/12/2016  Patient's Name: Casey [name] MRN: [id number] Date of Birth: 24-Aug-1940  Referring Provider: Dr. Berle Mull  Clinical History: This is a 76 year old woman with altered mental status.  Medications: levETIRAcetam (KEPPRA) 100 MG/ML solution 250 mg  allopurinol (ZYLOPRIM) tablet 100 mg  amantadine (SYMMETREL) 50 MG/5ML solution 100 mg  amiodarone (PACERONE) tablet 200 mg  atorvastatin (LIPITOR) tablet 10 mg  donepezil (ARICEPT) tablet 10 mg  feeding supplement (NEPRO CARB STEADY) liquid 1,000 mL  feeding supplement (PRO-STAT SUGAR FREE 64) liquid 30 mL  ferrous sulfate 300 (60 Fe) MG/5ML syrup 300 mg  free water 100 mL  heparin injection 5,000 Units  hydrALAZINE (APRESOLINE) injection 5 mg  hydrOXYzine (ATARAX/VISTARIL) tablet 10 mg  insulin aspart (novoLOG) injection 0-9 Units  insulin glargine (LANTUS) injection 14 Units  levalbuterol (XOPENEX) nebulizer solution 0.63 mg  metoprolol tartrate (LOPRESSOR) injection 2.5 mg  metoprolol tartrate (LOPRESSOR) tablet 50 mg  mirtazapine (REMERON) tablet 7.5 mg  multivitamin with minerals tablet 1 tablet  pantoprazole sodium (PROTONIX) 40 mg/20 mL oral suspension 40 mg  torsemide (DEMADEX) tablet 20 mg  zolpidem (AMBIEN) tablet 5 mg   Technical Summary: A multichannel digital EEG recording measured by the international 10-20 system with electrodes applied with paste and impedances below 5000 ohms performed in our laboratory with EKG monitoring in an unresponsive patient.  Hyperventilation was not performed. Photic stimulation was performed.  The digital EEG was referentially recorded, reformatted, and digitally filtered in a variety of bipolar and referential montages for optimal display.    Description: The patient is unresponsive during the recording. There is no clear posterior dominant rhythm. The background consists of a moderate amount of diffuse low voltage theta and  delta slowing admixed with diffuse low voltage beta activity. The record is symmetric. Normal sleep architecture is not seen.  Photic stimulation did not elicit any abnormalities.  There were no epileptiform discharges or electrographic seizures seen.    EKG lead showed sinus tachycardia.  Impression: This EEG is abnormal due to the presence of diffuse low voltage background slowing.  Clinical Correlation of the above findings indicates diffuse cerebral dysfunction that is nonspecific in etiology and may be seen with hypoxic/ischemic injury, toxic/metabolic encephalopathies, neurodegenerative conditions, or medication effect. The absence of epileptiform dischares does not exclude a clinical diagnosis of epilepsy. Clinical correlation is advised.   Ellouise Newer, M.D.

## 2016-11-12 NOTE — Progress Notes (Signed)
Gastric residual at 2000 30cc

## 2016-11-12 NOTE — Progress Notes (Signed)
*  PRELIMINARY RESULTS* Vascular Ultrasound Left upper ext venous duplex has been completed.  Preliminary findings: No evidence of DVT or superficial thrombosis. Area of mixed echoes noted at the distal left upper arm measuring 3.59 x 1.37 cm. Possibly hematoma vs unknown etiology.   Landry Mellow, RDMS, RVT  11/12/2016, 11:05 AM

## 2016-11-12 NOTE — Progress Notes (Signed)
EEG complete - results pending 

## 2016-11-12 NOTE — Progress Notes (Signed)
PROGRESS NOTE    Casey [name]  CZY:[id number] DOB: Feb 11, 1940 DOA: 11/03/2016 PCP: Gildardo Cranker, DO   Brief Narrative: Casey [name] is a 76 y.o. female history of CVA, dementia, chronic kidney disease, seizures disorder, dementia, chronic systolic CHF, type 2 diabetes, chronic atrial fibrillation. She presented with altered mental status with concern for a UTI and had associated atrial fibrillation with RVR. Completed Antibiotics. Now primary issue is volume overload   Assessment & Plan: Acute on chronic systolic CHF. Acute on chronic respiratory failure Patient was given aggressive IV hydration during the course of hospitalization At peak 13 L positive. IV Lasix has not been effective, neither  Torsemide.  Give IV bumex.Will monitor urine output. Continue oxygen. He requires chest physical therapy. Continue nasal suction. Xopenex when necessary.  Aspiration pneumonia Chest x-ray significant for atelectasis vs pneumonia. Urine culture negative for bacteria. -blood culture no growth for final report Change to levaquin, penn allergy. Completed 7 days of antibiotic course. remains at recurrent risk for aspiration  Moderate malnutrition PEG dependent Dysphagia Nepro tube feeding via PEG tube Pro-stat ordered per RD  Atrial fibrillation with RVR  Resolved on cardizem drip Cardizem has been contraindicated to be used in patients with systolic heart failure especially in acute decompensated 1. Given to the patient appears to be having acute on chronic systolic CHF I would discontinue Cardizem. Discussed with cardiology. Patient not a candidate for anticoagulation based on his last admission with GI bleed. Only option for rate control will be continuing IV Lopressor, continue oral Lopressor and starting the patient on amiodarone. Cardiology recommended to start the patient on 200 mg twice a day for one week and then transition to 200 mg daily. Cardiology reiterated their  poor prognosis for the patient from last admission. Discuss with daughter on phone that amiodarone may put pt at higher risk of stroke. Patient had one episode of NSVT, EKG following that shows QTc prolongation of 510. We will discontinue amiodarone.  back on Cardizem.  Acute metabolic encephalopathy Secondary to infection. Appears to be at baseline.  CKD stage III Stable  Hypernatremia Likely secondary to poor free water intake. S/p IV fluids Currently on free water 200 mg every 8 hours. Sodium improved with addition of D5, we'll increase the frequency of free water. Treated with d5  now resolved.  Diabetes mellitus, type 2 -SSI q4 hours -increase to Lantus 10 units  Upper airway wheeze Nasotracheal sunctioning PRN xopenex  DVT prophylaxis: Heparin Code Status: Full code Family Communication: Discussed with daughter on the phone. Disposition Plan: Discharge to SNF.   Consultants:   PCCM  Procedures:   None  Antimicrobials:  Ceftriaxone    Subjective: Multiple episodes of respiratory distress overnight. Denies any chest pain and abdominal pain at the time of my evaluation.  Objective: Vitals:   11/12/16 0610 11/12/16 0611 11/12/16 1300 11/12/16 1634  BP: (!) 71/57 107/60 108/76 105/67  Pulse: (!) 101 88 93 (!) 106  Resp: (!) 30  20   Temp: 98.9 F (37.2 C)  97.9 F (36.6 C)   TempSrc: Oral  Axillary   SpO2: 97%  96% 100%  Weight:      Height:        Intake/Output Summary (Last 24 hours) at 11/12/16 1838 Last data filed at 11/12/16 1600  Gross per 24 hour  Intake                5 ml  Output  556 ml  Net             -551 ml   Filed Weights   11/09/16 0500 11/10/16 0500 11/11/16 0719  Weight: 64.8 kg (142 lb 13.7 oz) 68.6 kg (151 lb 3.8 oz) 67.1 kg (148 lb)    Examination:  General exam: Appears calm and comfortable.  Respiratory system: Clear to auscultation. Bilateral expiratory wheezing primarily upper airway. Cardiovascular  system: S1 & S2 heard, RRR. No murmurs. Gastrointestinal system: Abdomen is nondistended, soft and nontender. Normal bowel sounds heard. Central nervous system: Alert and oriented to person and place. Right hemiparesis Extremities: No edema. No calf tenderness Skin: No cyanosis. No rashes  Data Reviewed: I have personally reviewed following labs and imaging studies  CBC:  Recent Labs Lab 11/08/16 0611 11/09/16 0538 11/10/16 0939 11/11/16 0625 11/12/16 0434  WBC 10.6* 11.2* 11.1* 9.1 8.0  NEUTROABS 8.1*  --  8.4* 6.9  --   HGB 9.1* 8.7* 8.7* 9.0* 8.6*  HCT 32.6* 31.0* 30.3* 31.7* 30.6*  MCV 85.3 84.7 84.2 83.9 84.8  PLT 248 208 214 245 300   Basic Metabolic Panel:  Recent Labs Lab 11/09/16 0538 11/09/16 2203 11/10/16 0612 11/10/16 1531 11/11/16 0625 11/12/16 0434  NA 143 141 142 142 144 144  K 3.4* 4.1 3.8 4.1 3.4* 3.5  CL 107 106 107 108 105 104  CO2 29 28 26 27 30 31   GLUCOSE 206* 231* 176* 260* 156* 205*  BUN 49* 52* 53* 53* 51* 62*  CREATININE 1.42* 1.43* 1.49* 1.54* 1.72* 2.03*  CALCIUM 9.7 9.7 9.5 9.9 10.4* 10.1  MG 2.0  --  2.1 2.0 2.1 2.0   GFR: Estimated Creatinine Clearance: 21.2 mL/min (A) (by C-G formula based on SCr of 2.03 mg/dL (H)). Liver Function Tests:  Recent Labs Lab 11/08/16 0611 11/10/16 0612 11/11/16 0625  AST 21 42* 19  ALT 21 15 16   ALKPHOS 113 99 110  BILITOT 0.5 0.7 0.7  PROT 6.6 6.0* 7.4  ALBUMIN 2.4* 2.1* 2.5*   No results for input(s): LIPASE, AMYLASE in the last 168 hours. No results for input(s): AMMONIA in the last 168 hours. Coagulation Profile: No results for input(s): INR, PROTIME in the last 168 hours. Cardiac Enzymes: No results for input(s): CKTOTAL, CKMB, CKMBINDEX, TROPONINI in the last 168 hours. BNP (last 3 results) No results for input(s): PROBNP in the last 8760 hours. HbA1C: No results for input(s): HGBA1C in the last 72 hours. CBG:  Recent Labs Lab 11/11/16 2341 11/12/16 0412 11/12/16 0815  11/12/16 1209 11/12/16 1710  GLUCAP 167* 198* 184* 237* 221*   Lipid Profile: No results for input(s): CHOL, HDL, LDLCALC, TRIG, CHOLHDL, LDLDIRECT in the last 72 hours. Thyroid Function Tests: No results for input(s): TSH, T4TOTAL, FREET4, T3FREE, THYROIDAB in the last 72 hours. Anemia Panel: No results for input(s): VITAMINB12, FOLATE, FERRITIN, TIBC, IRON, RETICCTPCT in the last 72 hours. Sepsis Labs: No results for input(s): PROCALCITON, LATICACIDVEN in the last 168 hours.  Recent Results (from the past 240 hour(s))  Culture, blood (Routine x 2)     Status: None   Collection Time: 11/03/16 10:01 PM  Result Value Ref Range Status   Specimen Description BLOOD RIGHT ANTECUBITAL  Final   Special Requests IN PEDIATRIC BOTTLE Blood Culture adequate volume  Final   Culture NO GROWTH 5 DAYS  Final   Report Status 11/08/2016 FINAL  Final  Culture, blood (Routine x 2)     Status: None   Collection Time:  11/03/16 10:10 PM  Result Value Ref Range Status   Specimen Description BLOOD RIGHT HAND  Final   Special Requests IN PEDIATRIC BOTTLE Blood Culture adequate volume  Final   Culture NO GROWTH 5 DAYS  Final   Report Status 11/08/2016 FINAL  Final  Urine Culture     Status: None   Collection Time: 11/03/16 10:36 PM  Result Value Ref Range Status   Specimen Description URINE, CATHETERIZED  Final   Special Requests NONE  Final   Culture NO GROWTH  Final   Report Status 11/05/2016 FINAL  Final  MRSA PCR Screening     Status: None   Collection Time: 11/04/16  4:40 AM  Result Value Ref Range Status   MRSA by PCR NEGATIVE NEGATIVE Final    Comment:        The GeneXpert MRSA Assay (FDA approved for NASAL specimens only), is one component of a comprehensive MRSA colonization surveillance program. It is not intended to diagnose MRSA infection nor to guide or monitor treatment for MRSA infections.          Radiology Studies: Dg Chest Port 1 View  Result Date:  11/11/2016 CLINICAL DATA:  Dyspnea EXAM: PORTABLE CHEST 1 VIEW COMPARISON:  11/09/2016 FINDINGS: Marked cardiomegaly. Central airspace opacities likely represent edema. Small pleural effusions bilaterally. Moderate vascular and interstitial prominence. Little or no interval change from the prior. IMPRESSION: The findings likely represent congestive heart failure with interstitial and alveolar edema as well as bilateral effusions. Electronically Signed   By: Andreas Newport M.D.   On: 11/11/2016 03:09        Scheduled Meds: . allopurinol  100 mg Per Tube Daily  . amantadine  100 mg Per Tube BID  . atorvastatin  10 mg Per Tube QHS  . donepezil  10 mg Per Tube QHS  . feeding supplement (PRO-STAT SUGAR FREE 64)  30 mL Per Tube Daily  . ferrous sulfate  300 mg Per Tube BID WC  . free water  100 mL Per Tube Q6H  . heparin subcutaneous  5,000 Units Subcutaneous Q8H  . insulin aspart  0-9 Units Subcutaneous Q4H  . insulin glargine  14 Units Subcutaneous QHS  . levETIRAcetam  250 mg Oral BID  . metoprolol tartrate  50 mg Per Tube BID  . mirtazapine  7.5 mg Per Tube QHS  . multivitamin with minerals  1 tablet Per Tube Daily  . pantoprazole sodium  40 mg Per Tube Daily  . torsemide  20 mg Oral Daily   Continuous Infusions: . sodium chloride    . feeding supplement (NEPRO CARB STEADY) 1,000 mL (11/11/16 0202)  . potassium chloride 10 mEq (11/12/16 1803)  . potassium chloride       LOS: 8 days     Author:  Berle Mull, MD [facility] Pager: [phone] 11/12/2016 6:38 PM     If 7PM-7AM, please contact night-coverage [email or web address] Password [email or web address]

## 2016-11-13 ENCOUNTER — Inpatient Hospital Stay (HOSPITAL_COMMUNITY): Payer: Medicare Other

## 2016-11-13 DIAGNOSIS — R609 Edema, unspecified: Secondary | ICD-10-CM

## 2016-11-13 LAB — CBC
HCT: 32.7 % — ABNORMAL LOW (ref 36.0–46.0)
Hemoglobin: 9.3 g/dL — ABNORMAL LOW (ref 12.0–15.0)
MCH: 24.2 pg — ABNORMAL LOW (ref 26.0–34.0)
MCHC: 28.4 g/dL — ABNORMAL LOW (ref 30.0–36.0)
MCV: 85.2 fL (ref 78.0–100.0)
PLATELETS: 231 10*3/uL (ref 150–400)
RBC: 3.84 MIL/uL — AB (ref 3.87–5.11)
RDW: 18.2 % — AB (ref 11.5–15.5)
WBC: 8.2 10*3/uL (ref 4.0–10.5)

## 2016-11-13 LAB — GLUCOSE, CAPILLARY
GLUCOSE-CAPILLARY: 182 mg/dL — AB (ref 65–99)
GLUCOSE-CAPILLARY: 186 mg/dL — AB (ref 65–99)
GLUCOSE-CAPILLARY: 195 mg/dL — AB (ref 65–99)
GLUCOSE-CAPILLARY: 213 mg/dL — AB (ref 65–99)
Glucose-Capillary: 211 mg/dL — ABNORMAL HIGH (ref 65–99)
Glucose-Capillary: 172 mg/dL — ABNORMAL HIGH (ref 65–99)

## 2016-11-13 LAB — BASIC METABOLIC PANEL
ANION GAP: 9 (ref 5–15)
BUN: 61 mg/dL — AB (ref 6–20)
CALCIUM: 10.1 mg/dL (ref 8.9–10.3)
CO2: 32 mmol/L (ref 22–32)
Chloride: 104 mmol/L (ref 101–111)
Creatinine, Ser: 1.87 mg/dL — ABNORMAL HIGH (ref 0.44–1.00)
GFR calc Af Amer: 29 mL/min — ABNORMAL LOW (ref >60)
GFR, EST NON AFRICAN AMERICAN: 25 mL/min — AB (ref >60)
GLUCOSE: 201 mg/dL — AB (ref 65–99)
POTASSIUM: 3.7 mmol/L (ref 3.5–5.1)
SODIUM: 145 mmol/L (ref 135–145)

## 2016-11-13 MED ORDER — FUROSEMIDE 10 MG/ML IJ SOLN
40.0000 mg | Freq: Once | INTRAMUSCULAR | Status: AC
  Administered 2016-11-13: 40 mg via INTRAVENOUS
  Filled 2016-11-13: qty 4

## 2016-11-13 MED ORDER — DILTIAZEM 12 MG/ML ORAL SUSPENSION
30.0000 mg | Freq: Four times a day (QID) | ORAL | Status: DC
Start: 2016-11-13 — End: 2016-11-13

## 2016-11-13 MED ORDER — LORAZEPAM 0.5 MG PO TABS: 0.5000 mg | ORAL_TABLET | Freq: Two times a day (BID) | ORAL | Status: DC | PRN

## 2016-11-13 MED ORDER — METOPROLOL TARTRATE 50 MG PO TABS
50.0000 mg | ORAL_TABLET | Freq: Three times a day (TID) | ORAL | Status: DC
  Administered 2016-11-13 – 2016-11-14 (×5): 50 mg
  Filled 2016-11-13 (×4): qty 1

## 2016-11-13 NOTE — Progress Notes (Signed)
Nutrition Follow-up  DOCUMENTATION CODES:   Non-severe (moderate) malnutrition in context of chronic illness  INTERVENTION:   -Continue Nepro @ 40 ml/hr via PEG    30 ml Prostat daily.    Tube feeding regimen provides 1828 kcals, 93 grams, and 698 ml free water (1098 total free water)  -Continue MVI daily  NUTRITION DIAGNOSIS:   Malnutrition related to chronic illness (dementia, CVA, CKD, CHF) as evidenced by moderate depletion of body fat, severe depletion of muscle mass.  Ongoing  GOAL:   Patient will meet greater than or equal to 90% of their needs  Met with TF  MONITOR:   Labs, Weight trends, TF tolerance, I & O's  REASON FOR ASSESSMENT:   Consult Enteral/tube feeding initiation and management  ASSESSMENT:   76 year old female history of CVA, dementia, chronic kidney disease, seizures disorder, dementia, chronic systolic CHF, type 2 diabetes, chronic atrial fibrillation with has been rate controlled presents today from long-term care facility with reports that she has been less responsive and has had increased respiratory rate.   10/9- transferred from ICU to floor 10/15- s/p EEG (abnormal)   Pt being cleaned by nurse tech at time of visit.  Pt remains on TF- Nepro infusing at 40 ml/hr via PEG. Pt also receiving MVI, zinc, and 30 ml Prostat daily via tube. Noted 100 ml free water every 6 hours adjusted on 11/10/16. Complete TF regimen providing 1828 kcals, 93 grams, and 698 ml free water (1098 total free water). Regimen providing 100% of estimated kcal and protein needs.  Labs reviewed: CBGS: 341-443 (current orders for glycemic control are 0-9 units insulin aspart every 4 hours, 14 units insulin glargine daily).   Diet Order:  Diet NPO time specified  Skin:  Wound (see comment) (open non pressure injury rt arm, DTI rt/lt heel)  Last BM:  11/07/16  Height:   Ht Readings from Last 1 Encounters:  11/06/16 '5\' 5"'  (1.651 m)    Weight:   Wt Readings  from Last 1 Encounters:  11/11/16 148 lb (67.1 kg)    Ideal Body Weight:  56.8 kg  BMI:  Body mass index is 24.63 kg/m.  Estimated Nutritional Needs:   Kcal:  1700-1900  Protein:  85-100 grams  Fluid:  1.7-1.9 L  EDUCATION NEEDS:   Education needs no appropriate at this time  Fawn Desrocher A. Jimmye Norman, RD, LDN, CDE Pager: [phone] After hours Pager: [phone]

## 2016-11-13 NOTE — Progress Notes (Signed)
Pt breathing faster listened to her lungs having some fine crackles,  O2 sat 100 PERCENTon 2L nasal canular,  HR 122,  Kirby notified at the floor and called rapid response, no new orders will continue to monitor

## 2016-11-13 NOTE — Progress Notes (Signed)
Gastric residual 10 cc 

## 2016-11-13 NOTE — Progress Notes (Signed)
PROGRESS NOTE    Casey [name]  ONG:[id number] DOB: Oct 30, 1940 DOA: 11/03/2016 PCP: Casey Cranker, DO   Brief Narrative: Casey [name] is a 76 y.o. female history of CVA, dementia, chronic kidney disease, seizures disorder, dementia, chronic systolic CHF, type 2 diabetes, chronic atrial fibrillation. She presented with altered mental status with concern for a UTI and had associated atrial fibrillation with RVR. Completed Antibiotics. Now primary issue is volume overload and anxiety   Assessment & Plan: Acute on chronic systolic CHF. Acute on chronic respiratory failure Patient was given aggressive IV hydration during the course of hospitalization At peak 13 L positive. IV Lasix has not been effective, neither Torsemide. Was given IV Bumex without any put as well.Patient was given IV Lasix 40 mg 1 today. Continue oxygen. May requires chest physical therapy. Continue nasal suction. Xopenex when necessary.  Aspiration pneumonia Chest x-ray significant for atelectasis vs pneumonia. Urine culture negative for bacteria. -blood culture no growth for final report Change to levaquin, penn allergy. Already Completed 7 days of antibiotic course. remains at recurrent risk for aspiration  Moderate malnutrition PEG dependent Dysphagia Nepro tube feeding via PEG tube Pro-stat ordered per RD  Atrial fibrillation with RVR  Resolved on cardizem drip Cardizem has been contraindicated to be used in patients with systolic heart failure especially in acute decompensated 1. Given to the patient appears to be having acute on chronic systolic CHF I would discontinue Cardizem. Discussed with cardiology. Patient not a candidate for anticoagulation based on his last admission with GI bleed. Only option for rate control will be continuing IV Lopressor, continue oral Lopressor and starting the patient on amiodarone. Cardiology recommended to start the patient on 200 mg twice a day for one week and  then transition to 200 mg daily. Cardiology reiterated their poor prognosis for the patient from last admission. Discuss with daughter on phone that amiodarone may put pt at higher risk of stroke. Patient had one episode of NSVT, EKG following that shows QTc prolongation of 510. We will discontinue amiodarone.  Increasing the dose of the Lopressor. Patient was given 50 mg 3 times a day on 11/12/2016 and I will change it to 100 mg twice a day starting tomorrow.  Acute metabolic encephalopathy Secondary to infection. Appears to be at baseline.  CKD stage III Stable  Hypernatremia Likely secondary to poor free water intake. S/p IV fluids Currently on free water 200 mg every 8 hours. Sodium improved with addition of D5, we'll increase the frequency of free water. Treated with d5  now resolved.  Diabetes mellitus, type 2 -SSI q4 hours -increase to Lantus 10 units  Upper airway wheeze Most likely occurring from pooling of the secretions. Nasotracheal sunctioning PRN xopenex  Anxiety. When necessary at [place] of care discussion. Spent almost 30 minutes talking with patient's daughter as well as son on the phone separately today on 11/13/2016. Explain patient's poor prognosis given recurrent aspiration pneumonia as well as recent diagnosis of upper arm DVT with recent admission for GI bleed and chronic debility due to stroke all of which on their own. Poor prognosis and combining with cardiomyopathy as well as chronic kidney disease patient's prognosis is very limited. Daughter had questions about hospice as she felt that he doesn't do any treatment. Patient's son will be here at 4 PM on 11/14/2016. I offered palliative care consult for discussion of goals of care. They would like to talk with them as well. Patient would at least benefit from filling  out the most form.   Right upper arm brachial vein DVT. Recent GI bleed. Patient has a right upper arm DVT, patient is actually  receiving heparin for DVT prophylaxis throughout the course of this hospitalization. There has not been seeing any bleeding this hospitalization. This DVT may be chronic given patient's recent stroke. At present, if the goal of care is aggressive treatment, I feel that the best approach would be serial ultrasound, repeating Doppler in 24 hours to monitor progression of the DVT and if the DVT has progressed consider therapeutic anticoagulation. Patient will remain at high risk for future DVTs due to her stroke and limited mobility. IVC filter therefore would be risky procedure for the patient.  DVT prophylaxis: Heparin Code Status: Full code Family Communication: Discussed with daughter on the phone. Disposition Plan: Discharge to SNF.   Consultants:   PCCM  Procedures:   None  Antimicrobials:  Ceftriaxone    Subjective: Throughout the day patient did not have any acute issues or complain. Later in the afternoon the patient had an episode of increased breathing with the distress and tachycardia. Patient was still awake and was able to also the question and refused having any acute chest pain or shortness of breath or vomiting or nausea.  Objective: Vitals:   11/13/16 0834 11/13/16 1300 11/13/16 1720 11/13/16 1736  BP: 132/70 125/62 132/78   Pulse: 68 (!) 107 (!) 138   Resp:  (!) 25 (!) 38 (!) 40  Temp:  97.7 F (36.5 C)    TempSrc:  Oral    SpO2:  100% 98%   Weight:      Height:        Intake/Output Summary (Last 24 hours) at 11/13/16 1934 Last data filed at 11/13/16 0700  Gross per 24 hour  Intake                0 ml  Output                0 ml  Net                0 ml   Filed Weights   11/09/16 0500 11/10/16 0500 11/11/16 0719  Weight: 64.8 kg (142 lb 13.7 oz) 68.6 kg (151 lb 3.8 oz) 67.1 kg (148 lb)    Examination:  General exam: Appears calm and comfortable.  Respiratory system: Clear to auscultation. Bilateral expiratory wheezing primarily upper  airway. Cardiovascular system: S1 & S2 heard, RRR. No murmurs. Gastrointestinal system: Abdomen is nondistended, soft and nontender. Normal bowel sounds heard. Central nervous system: Alert and oriented to person and place. Right hemiparesis Extremities: No edema. No calf tenderness Skin: No cyanosis. No rashes  Data Reviewed: I have personally reviewed following labs and imaging studies  CBC:  Recent Labs Lab 11/08/16 0611 11/09/16 0538 11/10/16 0939 11/11/16 0625 11/12/16 0434 11/13/16 0519  WBC 10.6* 11.2* 11.1* 9.1 8.0 8.2  NEUTROABS 8.1*  --  8.4* 6.9  --   --   HGB 9.1* 8.7* 8.7* 9.0* 8.6* 9.3*  HCT 32.6* 31.0* 30.3* 31.7* 30.6* 32.7*  MCV 85.3 84.7 84.2 83.9 84.8 85.2  PLT 248 208 214 245 216 481   Basic Metabolic Panel:  Recent Labs Lab 11/10/16 0612 11/10/16 1531 11/11/16 0625 11/12/16 0434 11/12/16 1753 11/13/16 0519  NA 142 142 144 144 143 145  K 3.8 4.1 3.4* 3.5 3.4* 3.7  CL 107 108 105 104 104 104  CO2 26 27 30 31 31  32  GLUCOSE 176* 260* 156* 205* 232* 201*  BUN 53* 53* 51* 62* 60* 61*  CREATININE 1.49* 1.54* 1.72* 2.03* 1.89* 1.87*  CALCIUM 9.5 9.9 10.4* 10.1 10.0 10.1  MG 2.1 2.0 2.1 2.0 2.0  --    GFR: Estimated Creatinine Clearance: 23 mL/min (A) (by C-G formula based on SCr of 1.87 mg/dL (H)). Liver Function Tests:  Recent Labs Lab 11/08/16 0611 11/10/16 0612 11/11/16 0625  AST 21 42* 19  ALT 21 15 16   ALKPHOS 113 99 110  BILITOT 0.5 0.7 0.7  PROT 6.6 6.0* 7.4  ALBUMIN 2.4* 2.1* 2.5*   No results for input(s): LIPASE, AMYLASE in the last 168 hours. No results for input(s): AMMONIA in the last 168 hours. Coagulation Profile: No results for input(s): INR, PROTIME in the last 168 hours. Cardiac Enzymes: No results for input(s): CKTOTAL, CKMB, CKMBINDEX, TROPONINI in the last 168 hours. BNP (last 3 results) No results for input(s): PROBNP in the last 8760 hours. HbA1C: No results for input(s): HGBA1C in the last 72  hours. CBG:  Recent Labs Lab 11/13/16 0008 11/13/16 0439 11/13/16 0759 11/13/16 1154 11/13/16 1653  GLUCAP 182* 211* 172* 213* 195*   Lipid Profile: No results for input(s): CHOL, HDL, LDLCALC, TRIG, CHOLHDL, LDLDIRECT in the last 72 hours. Thyroid Function Tests: No results for input(s): TSH, T4TOTAL, FREET4, T3FREE, THYROIDAB in the last 72 hours. Anemia Panel: No results for input(s): VITAMINB12, FOLATE, FERRITIN, TIBC, IRON, RETICCTPCT in the last 72 hours. Sepsis Labs: No results for input(s): PROCALCITON, LATICACIDVEN in the last 168 hours.  Recent Results (from the past 240 hour(s))  Culture, blood (Routine x 2)     Status: None   Collection Time: 11/03/16 10:01 PM  Result Value Ref Range Status   Specimen Description BLOOD RIGHT ANTECUBITAL  Final   Special Requests IN PEDIATRIC BOTTLE Blood Culture adequate volume  Final   Culture NO GROWTH 5 DAYS  Final   Report Status 11/08/2016 FINAL  Final  Culture, blood (Routine x 2)     Status: None   Collection Time: 11/03/16 10:10 PM  Result Value Ref Range Status   Specimen Description BLOOD RIGHT HAND  Final   Special Requests IN PEDIATRIC BOTTLE Blood Culture adequate volume  Final   Culture NO GROWTH 5 DAYS  Final   Report Status 11/08/2016 FINAL  Final  Urine Culture     Status: None   Collection Time: 11/03/16 10:36 PM  Result Value Ref Range Status   Specimen Description URINE, CATHETERIZED  Final   Special Requests NONE  Final   Culture NO GROWTH  Final   Report Status 11/05/2016 FINAL  Final  MRSA PCR Screening     Status: None   Collection Time: 11/04/16  4:40 AM  Result Value Ref Range Status   MRSA by PCR NEGATIVE NEGATIVE Final    Comment:        The GeneXpert MRSA Assay (FDA approved for NASAL specimens only), is one component of a comprehensive MRSA colonization surveillance program. It is not intended to diagnose MRSA infection nor to guide or monitor treatment for MRSA infections.           Radiology Studies: No results found.      Scheduled Meds: . allopurinol  100 mg Per Tube Daily  . amantadine  100 mg Per Tube BID  . atorvastatin  10 mg Per Tube QHS  . donepezil  10 mg Per Tube QHS  . feeding supplement (PRO-STAT SUGAR FREE  64)  30 mL Per Tube Daily  . ferrous sulfate  300 mg Per Tube BID WC  . free water  100 mL Per Tube Q6H  . heparin subcutaneous  5,000 Units Subcutaneous Q8H  . insulin aspart  0-9 Units Subcutaneous Q4H  . insulin glargine  14 Units Subcutaneous QHS  . levETIRAcetam  250 mg Oral BID  . metoprolol tartrate  50 mg Per Tube TID  . mirtazapine  7.5 mg Per Tube QHS  . multivitamin with minerals  1 tablet Per Tube Daily  . pantoprazole sodium  40 mg Per Tube Daily   Continuous Infusions: . sodium chloride    . feeding supplement (NEPRO CARB STEADY) 1,000 mL (11/13/16 1702)     LOS: 9 days     Author:  Berle Mull, MD [facility] Pager: [phone] 11/13/2016 7:34 PM     If 7PM-7AM, please contact night-coverage [email or web address] Password [email or web address]

## 2016-11-13 NOTE — Progress Notes (Signed)
Residual check @ 1717 is 0cc.

## 2016-11-13 NOTE — Progress Notes (Signed)
*  PRELIMINARY RESULTS* Vascular Ultrasound Right upper extremity venous duplex has been completed.  Preliminary findings: Right upper extremity appears positive for deep vein thrombosis in the right brachial veins.  Other visualized veins appear negative for thrombosis.  Preliminary results called to patients nurse, Cherlynn Kaiser @ 16:20.  Everrett Coombe 11/13/2016, 4:17 PM

## 2016-11-13 NOTE — Progress Notes (Signed)
RN called about increased RR and tachycardia, per RN patient has been this baseline and providers are aware.   Patient was sleeping when I arrived at 210.  She awoke to voice, did follow commands. I suctioned the patient, mouth care was done.   Lung sounds: diminished throughout but overall good air movement bilaterally.  RR 28-30 (manual respirations checked).  Other VS were stable.   - Per RN and documentation these are NOT new findings.  Patient is not in acute respiratory distress  RRT Interventions: NONE  End Time 215

## 2016-11-13 NOTE — Plan of Care (Signed)
Problem: Education: Goal: Knowledge of [place] [place] [facility] Education information/materials will improve Outcome: Not Met (add Reason) Patient not fully oriented

## 2016-11-13 NOTE — Progress Notes (Signed)
Inpatient Diabetes Program Recommendations  AACE/ADA: New Consensus Statement on Inpatient Glycemic Control (2015)  Target Ranges:  Prepandial:   less than 140 mg/dL      Peak postprandial:   less than 180 mg/dL (1-2 hours)      Critically ill patients:  140 - 180 mg/dL   Lab Results  Component Value Date   GLUCAP 172 (H) 11/13/2016   HGBA1C 6.8 (H) 11/04/2016    Review of Glycemic ControlResults for JENNAYA, POGUE (MRN [id number]) as of 11/13/2016 10:59  Ref. Range 11/12/2016 17:10 11/12/2016 20:53 11/13/2016 00:08 11/13/2016 04:39 11/13/2016 07:59  Glucose-Capillary Latest Ref Range: 65 - 99 mg/dL 221 (H) 207 (H) 182 (H) 211 (H) 172 (H)    Inpatient Diabetes Program Recommendations:    Patient currently has Nepro tube feeds running at 40 cc/ hr. Please consider adding Novolog tube feed coverage 2 units q 4 hours to cover CHO in continuous tube feed (hold if tube feeds held).  Thanks, Adah Perl, RN, BC-ADM Inpatient Diabetes Coordinator Pager [phone] (8a-5p)

## 2016-11-13 NOTE — Progress Notes (Signed)
Called to patient room by RN for NT suction.  Pt had clear bilateral breath sounds and did not seem she needed to be suctioned.  Pt was on nasal cannula.  O2 sat 98% RT will continue to monitor.

## 2016-11-13 NOTE — Progress Notes (Signed)
Notified MD of pt venus doppler results of positive for DVT in the brachial artery.

## 2016-11-14 ENCOUNTER — Encounter (HOSPITAL_COMMUNITY): Payer: Medicare Other

## 2016-11-14 DIAGNOSIS — Z515 Encounter for palliative care: Secondary | ICD-10-CM

## 2016-11-14 DIAGNOSIS — Z7189 Other specified counseling: Secondary | ICD-10-CM

## 2016-11-14 LAB — CBC
HCT: 32.7 % — ABNORMAL LOW (ref 36.0–46.0)
Hemoglobin: 9 g/dL — ABNORMAL LOW (ref 12.0–15.0)
MCH: 23.8 pg — AB (ref 26.0–34.0)
MCHC: 27.5 g/dL — ABNORMAL LOW (ref 30.0–36.0)
MCV: 86.5 fL (ref 78.0–100.0)
PLATELETS: 224 10*3/uL (ref 150–400)
RBC: 3.78 MIL/uL — AB (ref 3.87–5.11)
RDW: 18.5 % — ABNORMAL HIGH (ref 11.5–15.5)
WBC: 9.6 10*3/uL (ref 4.0–10.5)

## 2016-11-14 LAB — GLUCOSE, CAPILLARY
GLUCOSE-CAPILLARY: 179 mg/dL — AB (ref 65–99)
Glucose-Capillary: 169 mg/dL — ABNORMAL HIGH (ref 65–99)
Glucose-Capillary: 178 mg/dL — ABNORMAL HIGH (ref 65–99)
Glucose-Capillary: 196 mg/dL — ABNORMAL HIGH (ref 65–99)
Glucose-Capillary: 203 mg/dL — ABNORMAL HIGH (ref 65–99)
Glucose-Capillary: 216 mg/dL — ABNORMAL HIGH (ref 65–99)

## 2016-11-14 LAB — BASIC METABOLIC PANEL
Anion gap: 9 (ref 5–15)
BUN: 61 mg/dL — ABNORMAL HIGH (ref 6–20)
CALCIUM: 10.3 mg/dL (ref 8.9–10.3)
CHLORIDE: 108 mmol/L (ref 101–111)
CO2: 33 mmol/L — ABNORMAL HIGH (ref 22–32)
CREATININE: 1.67 mg/dL — AB (ref 0.44–1.00)
GFR calc Af Amer: 33 mL/min — ABNORMAL LOW (ref >60)
GFR, EST NON AFRICAN AMERICAN: 29 mL/min — AB (ref >60)
GLUCOSE: 206 mg/dL — AB (ref 65–99)
POTASSIUM: 3.5 mmol/L (ref 3.5–5.1)
Sodium: 150 mmol/L — ABNORMAL HIGH (ref 135–145)

## 2016-11-14 LAB — MAGNESIUM: Magnesium: 1.9 mg/dL (ref 1.7–2.4)

## 2016-11-14 MED ORDER — METOPROLOL TARTRATE 100 MG PO TABS
100.0000 mg | ORAL_TABLET | Freq: Two times a day (BID) | ORAL | Status: DC
  Administered 2016-11-14 – 2016-11-15 (×2): 100 mg
  Filled 2016-11-14 (×2): qty 1

## 2016-11-14 MED ORDER — FUROSEMIDE 10 MG/ML IJ SOLN
20.0000 mg | Freq: Once | INTRAMUSCULAR | Status: AC
  Administered 2016-11-15: 20 mg via INTRAVENOUS
  Filled 2016-11-14: qty 2

## 2016-11-14 NOTE — Progress Notes (Signed)
Pt has stage two at the sacrum  and skin break down at buttocks and inner thigh cleansed  I have put multiples of pink foam dressing on, deep tissue injury blt heels and wound rt lower leg also have put foam on it

## 2016-11-14 NOTE — Clinical Social Work Placement (Signed)
   CLINICAL SOCIAL WORK PLACEMENT  NOTE  Date:  11/14/2016  Patient Details  Name: Casey [name] MRN: [id number] Date of Birth: 07-23-1940  Clinical Social Work is seeking post-discharge placement for this patient at the [place] level of care (*CSW will initial, date and re-position this form in  chart as items are completed):  Yes   Patient/family provided with [place] Work Department's list of facilities offering this level of care within the geographic area requested by the patient (or if unable, by the patient's family).  Yes   Patient/family informed of their freedom to choose among providers that offer the needed level of care, that participate in Medicare, Medicaid or managed care program needed by the patient, have an available bed and are willing to accept the patient.  Yes   Patient/family informed of [facility]'s ownership interest in [facility] and [facility], as well as of the fact that they are under no obligation to receive care at these facilities.  PASRR submitted to EDS on       PASRR number received on       Existing PASRR number confirmed on 11/14/16     FL2 transmitted to all facilities in geographic area requested by pt/family on 11/14/16     FL2 transmitted to all facilities within larger geographic area on       Patient informed that his/her managed care company has contracts with or will negotiate with certain facilities, including the following:  [facility]     Yes   Patient/family informed of bed offers received.  Patient chooses bed at [place]     Physician recommends and patient chooses bed at      Patient to be transferred to [facility] on 11/14/16.  Patient to be transferred to facility by [facility]     Patient family notified on 11/14/16 of transfer.  Name of family member notified:  Lupita Leash, daughter     PHYSICIAN Please  prepare priority discharge summary, including medications, Please prepare prescriptions     Additional Comment:    _______________________________________________ Estanislado Emms, LCSW 11/14/2016, 2:52 PM

## 2016-11-14 NOTE — Progress Notes (Signed)
PMT no charge note.   Patient well known to palliative service, when she was hospitalized at [facility] long in last hospitalization, at that time, several family meetings were held with the patient's daughter and palliative providers in conjunction with [facility] MDs.   Chart reviewed in detail pertaining to this hospitalization.   Patient seen and examined this afternoon, awake alert, resting in bed, now has PEG tube, she does not appear to be in distress, there is no family at the bedside.   Recommendations: We have had extensive discussions with the patient's family in the last hospitalization, they are well aware of palliative philosophy of care, recommend SNF with palliative care on d/c.   No additional palliative recommendations at this time.  Thank you for the consult.  Loistine Chance MD [facility] health palliative medicine team [phone]

## 2016-11-14 NOTE — Progress Notes (Signed)
PROGRESS NOTE [facility]   Casey [name]   KNL:[id number] DOB: 1940-02-14  DOA: 11/03/2016 PCP: Gildardo Cranker, DO   Brief Narrative:  Casey [name] is a 76 year old female with history of CVA, dementia, chronic kidney disease, seizure disorders, type 2 diabetes mellitus, chronic A. Fib. Patient presented to the emergency department with altered mental status and concern of UTI and associated A. fib with RVR. Patient was found to be significantly volume overload and there was concern for aspiration pneumonia. Patient completed antibiotic treatment.  Subjective: Patient seen and examined, alert, awake, resting in bed. She does appear in acute distress. No family at bedside. She denies any pain, shortness of breath or discomfort.  Assessment & Plan: Acute on chronic systolic CHF Acute on chronic respiratory failure Patient was treated with aggressive IV hydration initially during the course of hospitalization, was +13 L Patient with EF of 20-25% Patient was treated with multiple diuretics, now responding well to IV Lasix. Continue medical management, patient with poor prognosis in the candidate for invasive procedures  Aspiration pneumonia Patient completed 7 days of antibiotic course with Levaquin. Blood cultures no growth Continue aspiration precautions as patient is PEG tube dependent.  Moderate malnutrition Pectoral dependence/dysphagia Continue Nepro per tube feeding  Chronic A. fib Heart rate well controlled Patient was treated with Cardizem drip, although this was discontinued given her severe systolic CHF. Patient not on anticoagulation given her history of GI bleed and poor prognosis. Patient was on amiodarone which was discontinued due to QTC prolongation and increase of stroke. 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 Will discuss with family benefits and risks before initiating anticoagulating agents  Pressure ulcer on b/l heels  Tx per wound care team   Goals of care Case discussed in detail with son over the phone. Explain patient's poor prognosis given recurrent aspiration as well as recent diagnosis of upper arm DVT with recent admission for GI bleed and chronic debilitation due to stroke. Given all comorbidities patient is not a candidate for oral anticoagulation. Son reported she is not ready for hospice and would like to continue with current treatment. I explained that she is not going to improve much further than this. We will discuss palliative care follow at SNF.   DVT prophylaxis: Heparin  Code Status: Full  Family Communication: Son via phone call  Disposition Plan: SNF in AM after discussing oral a/c with family.   Consultants:   PCCM   Palliative care   Procedures:   None   Antimicrobials: Anti-infectives    Start     Dose/Rate Route Frequency Ordered Stop   11/07/16 2000  levofloxacin (LEVAQUIN) 25 MG/ML solution 500 mg  Status:  Discontinued     500 mg Per Tube Every other day 11/07/16 1810 11/09/16 1020   11/05/16 1430  metroNIDAZOLE (FLAGYL) 50 mg/ml oral suspension 500 mg  Status:  Discontinued     500 mg Per Tube Daily 11/05/16 1242 11/07/16 1131   11/04/16 2300  vancomycin (VANCOCIN) 500 mg in sodium chloride 0.9 % 100 mL IVPB  Status:  Discontinued     500 mg 100 mL/hr over 60 Minutes Intravenous Every 24 hours 11/04/16 0041 11/04/16 0053   11/04/16 0600   piperacillin-tazobactam (ZOSYN) IVPB 3.375 g  Status:  Discontinued     3.375 g 12.5 mL/hr over 240 Minutes Intravenous Every 8 hours 11/04/16 0041 11/04/16 0053   11/04/16 0400  cefTRIAXone (ROCEPHIN) 1 g in dextrose 5 % 50 mL IVPB  Status:  Discontinued     1 g 100 mL/hr over 30 Minutes Intravenous Every 24 hours 11/04/16 0043 11/08/16 0818   11/03/16 2245  levofloxacin (LEVAQUIN) IVPB 750 mg     750 mg 100 mL/hr over 90 Minutes Intravenous  Once 11/03/16 2235 11/04/16 0348   11/03/16 2245  aztreonam (AZACTAM) 2 g in dextrose 5 % 50 mL IVPB     2 g 100 mL/hr over 30 Minutes Intravenous  Once 11/03/16 2235 11/04/16 0349   11/03/16 2245  vancomycin (VANCOCIN) IVPB 1000 mg/200 mL premix     1,000 mg 200 mL/hr over 60 Minutes Intravenous  Once 11/03/16 2235 11/04/16 0349          Objective: Vitals:   11/14/16 0430 11/14/16 1017 11/14/16 1300 11/14/16 1341  BP: 113/77  (!) 142/87 (!) 142/33  Pulse: (!) 49 62 (!) 140 96  Resp: (!) 30  20 18   Temp: 98.2 F (36.8 C)  98.8 F (37.1 C) 98.8 F (37.1 C)  TempSrc:   Axillary Oral  SpO2: 100%  100% 100%  Weight:      Height:        Intake/Output Summary (Last 24 hours) at 11/14/16 1829 Last data filed at 11/14/16 6440  Gross per 24 hour  Intake                3 ml  Output              500 ml  Net             -497 ml   Filed Weights   11/09/16 0500 11/10/16 0500 11/11/16 0719  Weight: 64.8 kg (142 lb 13.7 oz) 68.6 kg (151 lb 3.8 oz) 67.1 kg (148 lb)    Examination:  General exam: Appears calm and comfortable  Respiratory system: Good air entry, mild bilateral crackles at the bases Cardiovascular system: S1 & S2 heard, RRR. No JVD, murmurs, rubs or gallops Gastrointestinal system: Abdomen is nondistended, soft and nontender.  Central nervous system: Alert and oriented. R hemiparesis.  Extremities: Trace pedal edema b/l  Skin: Warm and dry   Data Reviewed: I have personally reviewed following labs and imaging  studies  CBC:  Recent Labs Lab 11/08/16 0611  11/10/16 0939 11/11/16 0625 11/12/16 0434 11/13/16 0519 11/14/16 0658  WBC 10.6*  < > 11.1* 9.1 8.0 8.2 9.6  NEUTROABS 8.1*  --  8.4* 6.9  --   --   --   HGB 9.1*  < > 8.7* 9.0* 8.6* 9.3* 9.0*  HCT 32.6*  < > 30.3* 31.7* 30.6* 32.7* 32.7*  MCV 85.3  < > 84.2 83.9 84.8 85.2 86.5  PLT 248  < > 214 245 216 231 224  < > = values in this interval not displayed. Basic Metabolic Panel:  Recent Labs Lab 11/10/16 1531 11/11/16 0625 11/12/16 0434 11/12/16 1753 11/13/16 0519 11/14/16 0658  NA 142 144 144 143 145 150*  K 4.1 3.4* 3.5 3.4* 3.7  3.5  CL 108 105 104 104 104 108  CO2 27 30 31 31  32 33*  GLUCOSE 260* 156* 205* 232* 201* 206*  BUN 53* 51* 62* 60* 61* 61*  CREATININE 1.54* 1.72* 2.03* 1.89* 1.87* 1.67*  CALCIUM 9.9 10.4* 10.1 10.0 10.1 10.3  MG 2.0 2.1 2.0 2.0  --  1.9   GFR: Estimated Creatinine Clearance: 25.8 mL/min (A) (by C-G formula based on SCr of 1.67 mg/dL (H)). Liver Function Tests:  Recent Labs Lab 11/08/16 0611 11/10/16 0612 11/11/16 0625  AST 21 42* 19  ALT 21 15 16   ALKPHOS 113 99 110  BILITOT 0.5 0.7 0.7  PROT 6.6 6.0* 7.4  ALBUMIN 2.4* 2.1* 2.5*   No results for input(s): LIPASE, AMYLASE in the last 168 hours. No results for input(s): AMMONIA in the last 168 hours. Coagulation Profile: No results for input(s): INR, PROTIME in the last 168 hours. Cardiac Enzymes: No results for input(s): CKTOTAL, CKMB, CKMBINDEX, TROPONINI in the last 168 hours. BNP (last 3 results) No results for input(s): PROBNP in the last 8760 hours. HbA1C: No results for input(s): HGBA1C in the last 72 hours. CBG:  Recent Labs Lab 11/14/16 0017 11/14/16 0428 11/14/16 0735 11/14/16 1158 11/14/16 1614  GLUCAP 203* 179* 178* 216* 169*   Lipid Profile: No results for input(s): CHOL, HDL, LDLCALC, TRIG, CHOLHDL, LDLDIRECT in the last 72 hours. Thyroid Function Tests: No results for input(s): TSH, T4TOTAL, FREET4,  T3FREE, THYROIDAB in the last 72 hours. Anemia Panel: No results for input(s): VITAMINB12, FOLATE, FERRITIN, TIBC, IRON, RETICCTPCT in the last 72 hours. Sepsis Labs: No results for input(s): PROCALCITON, LATICACIDVEN in the last 168 hours.  No results found for this or any previous visit (from the past 240 hour(s)).    Radiology Studies: No results found.   Scheduled Meds: . allopurinol  100 mg Per Tube Daily  . amantadine  100 mg Per Tube BID  . atorvastatin  10 mg Per Tube QHS  . donepezil  10 mg Per Tube QHS  . feeding supplement (PRO-STAT SUGAR FREE 64)  30 mL Per Tube Daily  . ferrous sulfate  300 mg Per Tube BID WC  . free water  100 mL Per Tube Q6H  . heparin subcutaneous  5,000 Units Subcutaneous Q8H  . insulin aspart  0-9 Units Subcutaneous Q4H  . insulin glargine  14 Units Subcutaneous QHS  . levETIRAcetam  250 mg Oral BID  . metoprolol tartrate  50 mg Per Tube TID  . mirtazapine  7.5 mg Per Tube QHS  . multivitamin with minerals  1 tablet Per Tube Daily  . pantoprazole sodium  40 mg Per Tube Daily   Continuous Infusions: . sodium chloride    . feeding supplement (NEPRO CARB STEADY) 1,000 mL (11/13/16 1702)     LOS: 10 days    Time spent: Total of 25 minutes spent with pt, greater than 50% of which was spent in discussion of  treatment, counseling and coordination of care   Chipper Oman, MD Pager: Text Page via [email or web address]   If 7PM-7AM, please contact night-coverage [email or web address] 11/14/2016, 6:29 PM

## 2016-11-14 NOTE — Progress Notes (Signed)
Gastric residual 20

## 2016-11-14 NOTE — Progress Notes (Signed)
Gastric residual 10 cc 

## 2016-11-14 NOTE — Progress Notes (Signed)
Gastric residual 10

## 2016-11-15 ENCOUNTER — Inpatient Hospital Stay (HOSPITAL_COMMUNITY): Payer: Medicare Other

## 2016-11-15 DIAGNOSIS — R609 Edema, unspecified: Secondary | ICD-10-CM

## 2016-11-15 LAB — GLUCOSE, CAPILLARY
GLUCOSE-CAPILLARY: 169 mg/dL — AB (ref 65–99)
GLUCOSE-CAPILLARY: 191 mg/dL — AB (ref 65–99)
Glucose-Capillary: 178 mg/dL — ABNORMAL HIGH (ref 65–99)
Glucose-Capillary: 195 mg/dL — ABNORMAL HIGH (ref 65–99)
Glucose-Capillary: 197 mg/dL — ABNORMAL HIGH (ref 65–99)
Glucose-Capillary: 219 mg/dL — ABNORMAL HIGH (ref 65–99)

## 2016-11-15 LAB — BASIC METABOLIC PANEL
Anion gap: 10 (ref 5–15)
BUN: 65 mg/dL — AB (ref 6–20)
CALCIUM: 10.3 mg/dL (ref 8.9–10.3)
CHLORIDE: 109 mmol/L (ref 101–111)
CO2: 30 mmol/L (ref 22–32)
CREATININE: 1.7 mg/dL — AB (ref 0.44–1.00)
GFR calc Af Amer: 33 mL/min — ABNORMAL LOW (ref >60)
GFR, EST NON AFRICAN AMERICAN: 28 mL/min — AB (ref >60)
Glucose, Bld: 190 mg/dL — ABNORMAL HIGH (ref 65–99)
Potassium: 3.5 mmol/L (ref 3.5–5.1)
SODIUM: 149 mmol/L — AB (ref 135–145)

## 2016-11-15 LAB — CBC
HCT: 31.2 % — ABNORMAL LOW (ref 36.0–46.0)
Hemoglobin: 8.7 g/dL — ABNORMAL LOW (ref 12.0–15.0)
MCH: 24.1 pg — AB (ref 26.0–34.0)
MCHC: 27.9 g/dL — AB (ref 30.0–36.0)
MCV: 86.4 fL (ref 78.0–100.0)
PLATELETS: 227 10*3/uL (ref 150–400)
RBC: 3.61 MIL/uL — ABNORMAL LOW (ref 3.87–5.11)
RDW: 17.9 % — AB (ref 11.5–15.5)
WBC: 8.9 10*3/uL (ref 4.0–10.5)

## 2016-11-15 MED ORDER — METOPROLOL TARTRATE 100 MG PO TABS: 100.0000 mg | ORAL_TABLET | Freq: Two times a day (BID) | ORAL | Status: DC

## 2016-11-15 MED ORDER — FREE WATER: 100.0000 mL | Freq: Three times a day (TID) | Status: DC

## 2016-11-15 NOTE — Care Management Important Message (Signed)
Important Message  Patient Details  Name: Casey [name] MRN: [id number] Date of Birth: 1940/02/07   Medicare Important Message Given:  Yes    Nathen May 11/15/2016, 12:36 PM

## 2016-11-15 NOTE — Progress Notes (Signed)
*  PRELIMINARY RESULTS* Vascular Ultrasound Right upper extremity venous duplex has been completed.  Preliminary findings: Findings consistent with subacute deep vein thrombosis involving the proximal and mid brachial veins of the right upper extremity. Other visualized veins of the right upper extremity appear negative for thrombus.  Exam appears unchanged from prior study.  Casey [name] 11/15/2016, 2:55 PM

## 2016-11-15 NOTE — Progress Notes (Signed)
Patient will DC to: [facility] Anticipated DC date: 11/15/16 Family notified: Son Transport by: Corey Harold   Per MD patient ready for DC to [facility]. RN, patient, patient's family, and facility notified of DC. Discharge Summary sent to facility. RN given number for report [phone] Room 106A). DC packet on chart. Ambulance transport requested for patient.   CSW signing off.  Cedric Fishman, [place] Social Worker [phone]

## 2016-11-15 NOTE — Discharge Summary (Signed)
Physician Discharge Summary  Casey [name]  LPF:[id number]  DOB: [birth year]  DOA: 11/03/2016 PCP: Gildardo Cranker, DO  Admit date: 11/03/2016 Discharge date: 11/15/2016  Admitted From: SNF  Disposition:  SNF   Recommendations for Outpatient Follow-up:  1. Frequent suctioning to avoid aspiration  2. Palliative care consult  3. Monitor fluid intake  4. Aspiration precautions - elevate head of bed at 30 degrees   Discharge Condition: Fair   CODE STATUS: FULL  Diet recommendation: Peg tube feedings - Nepro   Brief/Interim Summary: Casey [name] is a 76 year old female with history of CVA, dementia, chronic kidney disease, seizure disorders, type 2 diabetes mellitus, chronic A. Fib. Patient presented to the emergency department with altered mental status and concern of UTI and associated A. fib with RVR. Patient initially wa treated with aggressive IVF for concerns of infectious process, subsequently developed be significantly volume overload and there was concern for aspiration pneumonia. Patient completed antibiotic treatment. Patient was aggressively diuresed with significant improvement. During hospital stay a new DVT of R arm was diagnosed, and decision of not to begging oral a/c was made in agreement with son. Patient has return to baseline. She is clinically optimized and will be discharge to SNF will palliative care consult.   Subjective: Patient seen and examined, she report doing well, she feels hot, seems slight diaphoretic. Denies chest pain, SOB and palpitations. Patient had an isolated temp of 100.4. No signs of new infections, new CXR improving, EKG with no acute changes. Patient tolerating tube feedings well    Discharge Diagnoses/Hospital Course:  Acute on chronic systolic CHF due to over hydration  Acute on chronic respiratory failure Patient was treated with aggressive IV hydration initially during the course of hospitalization, was +13 L Patient with EF of 20-25%   Patient was treated with multiple diuretics, subsequently responded well to IV lasix, will hold lasix for now due to mild hypernatremia.  Monitor fluid intake, resume Lasix if signs of fluid overload, likely will needed in the next 2-3 days  Continue medical management, patient with poor prognosis not candidate for invasive procedures Continue O2 supplementation to keep O2 sat above 89%   Aspiration pneumonia Patient completed 7 days of antibiotic course with Levaquin. Blood cultures no growth Continue aspiration precautions as patient is PEG tube dependent.  Moderate malnutrition PEGl dependence/dysphagia Continue Nepro per tube feeding  Chronic A. fib Heart rate well controlled, new EKG no changes patient in A fib, mild RVR HR @ 110  Patient was treated with Cardizem drip, although this was discontinued given her severe systolic CHF. Patient not on anticoagulation given her history of GI bleed and poor prognosis. Patient was on amiodarone which was discontinued due to QTC prolongation and increase of stroke. Patient doing better on Lopressor 100 mg BID, continue for now  Acute metabolic encephalopathy Secondary to infection Appears to be at baseline  Chronic kidney disease stage III Creatinine at baseline Monitor BMP in 1 week   Hypernatremia Likely secondary to poor free water intake and Lasix  Treated with D5, sodium improving, hold lasix for now.   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 I discussed risk and benefit of a/c, they decide not to treat with blood thinners. I agree with their decision as patient has high risk for bleeding. Discussed  the risk of stroke given her A fib, son understands.   Severe dementia  Continue current medications, Aricept  and Amantadine - consider d/c unknown benefit at this point.   Pressure ulcer on b/l heels   Goals of care Palliative care consult as outpatient   On the day of the discharge the patient's vitals were stable, and no other acute medical condition were reported by patient. the patient was felt safe to be discharge to SNF   Discharge Instructions  You were cared for by a hospitalist during your hospital stay. If you have any questions about your discharge medications or the care you received while you were in the hospital after you are discharged, you can call the unit and asked to speak with the hospitalist on call if the hospitalist that took care of you is not available. Once you are discharged, your primary care physician will handle any further medical issues. Please note that NO REFILLS for any discharge medications will be authorized once you are discharged, as it is imperative that you return to your primary care physician (or establish a relationship with a primary care physician if you do not have one) for your aftercare needs so that they can reassess your need for medications and monitor your lab values.  Discharge Instructions    Call MD for:  difficulty breathing, headache or visual disturbances    Complete by:  As directed    Call MD for:  extreme fatigue    Complete by:  As directed    Call MD for:  hives    Complete by:  As directed    Call MD for:  persistant dizziness or light-headedness    Complete by:  As directed    Call MD for:  persistant nausea and vomiting    Complete by:  As directed    Call MD for:  redness, tenderness, or signs of infection (pain, swelling, redness, odor or green/yellow discharge around incision site)    Complete by:  As directed    Call MD for:  severe uncontrolled pain    Complete by:  As directed    Call MD for:  temperature >100.4    Complete by:  As directed      Allergies as of 11/15/2016      Reactions   Penicillins Other (See  Comments)   Told by a doctor to "not take" and on MAR as an allergy Has patient had a PCN reaction causing immediate rash, facial/tongue/throat swelling, SOB or lightheadedness with hypotension: Unknown Has patient had a PCN reaction causing severe rash involving mucus membranes or skin necrosis: Unknown Has patient had a PCN reaction that required hospitalization: Unknown Has patient had a PCN reaction occurring within the last 10 years: Unknown If all of the above answers are "NO", then may proceed with Ceph      Medication List    STOP taking these medications   diltiazem 30 MG tablet Commonly known as:  CARDIZEM   levofloxacin 750 MG/150ML Soln Commonly known as:  LEVAQUIN   metoCLOPramide 5 MG tablet Commonly known as:  REGLAN   zinc sulfate 220 (50 Zn) MG capsule     TAKE these medications   allopurinol 100 MG tablet Commonly known as:  ZYLOPRIM Place 100 mg into feeding tube daily.   Amantadine HCl 100 MG tablet Place 100 mg into feeding tube 2 (two) times daily.   atorvastatin 10 MG tablet Commonly known as:  LIPITOR Place 10 mg into  feeding tube at bedtime.   donepezil 10 MG tablet Commonly known as:  ARICEPT Place 10 mg into feeding tube at bedtime.   feeding supplement (PRO-STAT SUGAR FREE 64) Liqd Place 30 mLs into feeding tube 2 (two) times daily.   ferrous sulfate 325 (65 FE) MG tablet Give 1 tablet via G-Tube tow times daily   free water Soln Place 100 mLs into feeding tube every 8 (eight) hours.   HUMALOG 100 UNIT/ML injection Generic drug:  insulin lispro Inject 5 Units into the skin every 6 (six) hours.   insulin glargine 100 unit/mL Sopn Commonly known as:  LANTUS Inject 7 Units into the skin at bedtime.   levETIRAcetam 250 MG tablet Commonly known as:  KEPPRA Give 1 tablet via G-Tube two times daily   metoprolol tartrate 100 MG tablet Commonly known as:  LOPRESSOR Place 1 tablet (100 mg total) into feeding tube 2 (two) times  daily. What changed:  medication strength  how much to take   mirtazapine 7.5 MG tablet Commonly known as:  REMERON Place 7.5 mg into feeding tube at bedtime.   pantoprazole 40 MG tablet Commonly known as:  PROTONIX Give 1 tablet via G-Tube tow times a day   THERA-M Tabs 1 tablet by PEG Tube route daily.      Follow-up Information    Gildardo Cranker, DO. Schedule an appointment as soon as possible for a visit in 1 week(s).   Specialty:  Internal Medicine Why:  Hospital follow up  Contact information: [address] [place] [address] [phone]          Allergies  Allergen Reactions  . Penicillins Other (See Comments)    Told by a doctor to "not take" and on MAR as an allergy Has patient had a PCN reaction causing immediate rash, facial/tongue/throat swelling, SOB or lightheadedness with hypotension: Unknown Has patient had a PCN reaction causing severe rash involving mucus membranes or skin necrosis: Unknown Has patient had a PCN reaction that required hospitalization: Unknown Has patient had a PCN reaction occurring within the last 10 years: Unknown If all of the above answers are "NO", then may proceed with Ceph    Consultations:  None    Procedures/Studies: Ir Gastrostomy Tube Mod Sed  Result Date: 10/18/2016 INDICATION: 76 year old female with dysphagia EXAM: IMAGE GUIDED GASTROSTOMY TUBE PLACEMENT MEDICATIONS: Vancomycin 1 gm IV; Antibiotics were administered within 1 hour of the procedure. Glucagon 1 mg IV ANESTHESIA/SEDATION: Versed 1.0 mg IV; Fentanyl 50 mcg IV Moderate Sedation Time:  15 minutes The patient was continuously monitored during the procedure by the interventional radiology nurse under my direct supervision. CONTRAST:  10 cc - administered into the gastric lumen. FLUOROSCOPY TIME:  Fluoroscopy Time: 1 minutes 24 seconds COMPLICATIONS: None PROCEDURE: Informed written consent was obtained from the patient and patient's  family after a thorough discussion of the procedural risks, benefits and alternatives. All questions were addressed. Maximal Sterile Barrier Technique was utilized including caps, mask, sterile gowns, sterile gloves, sterile drape, hand hygiene and skin antiseptic. A timeout was performed prior to the initiation of the procedure. The procedure, risks, benefits, and alternatives were explained to the patient. Questions regarding the procedure were encouraged and answered. The patient understands and consents to the procedure. The epigastrium was prepped with Betadine in a sterile fashion, and a sterile drape was applied covering the operative field. A sterile gown and sterile gloves were used for the procedure. A 5-French orogastric tube is placed under fluoroscopic  guidance. Scout imaging of the abdomen confirms barium within the transverse colon. The stomach was distended with gas. Under fluoroscopic guidance, an 18 gauge needle was utilized to puncture the anterior wall of the body of the stomach. An Amplatz wire was advanced through the needle passing a T fastener into the lumen of the stomach. The T fastener was secured for gastropexy. A 9-French sheath was inserted. A snare was advanced through the 9-French sheath. A Britta Mccreedy was advanced through the orogastric tube. It was snared then pulled out the oral cavity, pulling the snare, as well. The leading edge of the gastrostomy was attached to the snare. It was then pulled down the esophagus and out the percutaneous site. It was secured in place. Contrast was injected. No complication IMPRESSION: Status post image guided percutaneous gastrostomy tube placement. Signed, Dulcy Fanny. Earleen Newport, DO [facility] Electronically Signed   By: Corrie Mckusick D.O.   On: 10/18/2016 17:56   Dg Chest Port 1 View  Result Date: 11/15/2016 CLINICAL DATA:  Encounter for shortness of breath . EXAM: PORTABLE CHEST 1 VIEW  COMPARISON:  11/11/2016 . FINDINGS: Cardiomegaly with pulmonary vascular prominence and bilateral interstitial prominence bilateral pleural effusions. Findings consistent congestive heart failure. Findings have progressed slightly from prior exam. IMPRESSION: Congestive heart failure bilateral from interstitial edema bilateral pleural effusions. Slight progression from prior exam. Electronically Signed   By: Marcello Moores  Register   On: 11/15/2016 11:32   Dg Chest Port 1 View  Result Date: 11/11/2016 CLINICAL DATA:  Dyspnea EXAM: PORTABLE CHEST 1 VIEW COMPARISON:  11/09/2016 FINDINGS: Marked cardiomegaly. Central airspace opacities likely represent edema. Small pleural effusions bilaterally. Moderate vascular and interstitial prominence. Little or no interval change from the prior. IMPRESSION: The findings likely represent congestive heart failure with interstitial and alveolar edema as well as bilateral effusions. Electronically Signed   By: Andreas Newport M.D.   On: 11/11/2016 03:09   Dg Chest Port 1 View  Result Date: 11/09/2016 CLINICAL DATA:  Shortness of breath, wheezing EXAM: PORTABLE CHEST 1 VIEW COMPARISON:  11/05/2016 FINDINGS: Cardiomegaly with vascular congestion. Bilateral lower lobe airspace opacities and effusions. Interstitial prominence, likely interstitial edema. IMPRESSION: Changes of CHF.  Small bilateral pleural effusions. Electronically Signed   By: Rolm Baptise M.D.   On: 11/09/2016 11:13   Dg Chest Port 1 View  Result Date: 11/05/2016 CLINICAL DATA:  Tachypnea.  History of CHF, former smoker, CLL EXAM: PORTABLE CHEST 1 VIEW COMPARISON:  Chest x-ray of November 03, 2016 FINDINGS: The lungs are well-expanded. There are small bilateral pleural effusions. The retrocardiac region on the left remains dense. The cardiac silhouette remains enlarged. The pulmonary vascularity is engorged centrally. There is bilateral hilar prominence. There is calcification in the wall of the thoracic aorta.  The bony thorax exhibits no acute abnormality. IMPRESSION: CHF superimposed upon COPD and smoking related changes. Left lower lobe atelectasis or pneumonia. Small bilateral pleural effusions. Thoracic aortic atherosclerosis. Electronically Signed   By: David  Martinique M.D.   On: 11/05/2016 08:41   Dg Chest Portable 1 View  Result Date: 11/03/2016 CLINICAL DATA:  Altered mental status, shortness of breath, hypertension, diabetes mellitus, former smoker, CHF, history lymphoma EXAM: PORTABLE CHEST 1 VIEW COMPARISON:  Portable exam 2204 hours compared to 10/29/2016 FINDINGS: Enlargement of cardiac silhouette with mild pulmonary vascular congestion. Atherosclerotic calcification aorta. Mediastinal contours and pulmonary vascularity normal. Persistent atelectasis versus infiltrate in LEFT lower lobe. Minimal perihilar edema. Peribronchial thickening persists. Mild RIGHT basilar atelectasis.  No gross pleural effusion or pneumothorax. Bones demineralized. IMPRESSION: Enlargement of cardiac silhouette with pulmonary vascular congestion and question minimal pulmonary edema. RIGHT basilar atelectasis with persistent atelectasis versus consolidation in LEFT lower lobe. Electronically Signed   By: Lavonia Dana M.D.   On: 11/03/2016 22:33   Dg Chest Port 1 View  Result Date: 10/29/2016 CLINICAL DATA:  Respiratory failure EXAM: PORTABLE CHEST 1 VIEW COMPARISON:  10/28/2016 FINDINGS: The heart remains markedly enlarged. Pulmonary edema and vascular congestion are stable. Bilateral small pleural effusions are stable. No pneumothorax. Aortic atherosclerotic calcifications are again noted. IMPRESSION: Stable CHF with small bilateral pleural effusions. Electronically Signed   By: Marybelle Killings M.D.   On: 10/29/2016 07:28   Dg Chest Port 1 View  Result Date: 10/28/2016 CLINICAL DATA:  Shortness of breath.  Difficulty breathing. EXAM: PORTABLE CHEST 1 VIEW COMPARISON:  10/27/2016 and 10/25/2016. FINDINGS: 1301 hours. Cardiomegaly  and aortic atherosclerosis are again noted. There is central enlargement of the pulmonary vasculature with mild pulmonary edema and small bilateral pleural effusions. Probable head left-greater-than-right basilar atelectasis is unchanged. There is no pneumothorax. The bones appear stable. IMPRESSION: Little change from previous study. Persistent findings compatible with congestive heart failure with bilateral pleural effusions. Electronically Signed   By: Richardean Sale M.D.   On: 10/28/2016 13:47   Dg Chest Port 1 View  Result Date: 10/27/2016 CLINICAL DATA:  Encounter for aspiration for pneumonia EXAM: PORTABLE CHEST 1 VIEW COMPARISON:  10/25/2016 FINDINGS: The heart is enlarged. There is pulmonary vascular congestion and probable mild edema. There are bilateral pleural effusions. Opacity at the left lung base partially obscures the hemidiaphragm, compatible with atelectasis, infiltrate, or edema. IMPRESSION: Cardiomegaly.  Probable mild edema. Left lower lobe atelectasis and/or edema. Electronically Signed   By: Nolon Nations M.D.   On: 10/27/2016 16:24   Dg Chest Port 1 View  Result Date: 10/25/2016 CLINICAL DATA:  Acute shortness of breath and wheezing. EXAM: PORTABLE CHEST 1 VIEW COMPARISON:  10/05/2016 and prior radiographs FINDINGS: Cardiomegaly and fullness of both hilar regions again noted. New interstitial opacities are present compatible with interstitial edema. There may be trace bilateral pleural effusions present. Mild bibasilar atelectasis noted. There is no evidence of pneumothorax. No acute bony abnormalities are identified. IMPRESSION: 1. New interstitial opacities compatible with interstitial pulmonary edema. Question trace bilateral pleural effusions. 2. Cardiomegaly. Electronically Signed   By: Margarette Canada M.D.   On: 10/25/2016 13:37    Discharge Exam: Vitals:   11/15/16 1144 11/15/16 1322  BP: 109/61   Pulse: (!) 122   Resp: 20   Temp:  98.2 F (36.8 C)  SpO2: 100%     Vitals:   11/14/16 2013 11/15/16 0438 11/15/16 1144 11/15/16 1322  BP: 118/64 114/64 109/61   Pulse: (!) 121 97 (!) 122   Resp: 18 17 20    Temp: 99.7 F (37.6 C) (!) 100.4 F (38 C)  98.2 F (36.8 C)  TempSrc: Oral Oral  Axillary  SpO2: 99% 98% 100%   Weight:  67.5 kg (148 lb 13 oz)    Height:        General: NAD  Cardiovascular: Irr Irr, S1/S2 +, no rubs, no gallops Respiratory: Good air entry, mild bibasilar crackles  Abdominal: Soft, NT, ND, bowel sounds + Extremities: R side hemiparesis, swollen R arm, trace pedal edema b/l  Skin: Warm, slight diaphoretic   The results of significant diagnostics from this hospitalization (including imaging, microbiology, ancillary and laboratory) are listed below for reference.  Microbiology: No results found for this or any previous visit (from the past 240 hour(s)).   Labs: BNP (last 3 results)  Recent Labs  09/14/16 1050 11/04/16 0217 11/10/16 1531  BNP 932.9* 1,841.2* 885.0*   Basic Metabolic Panel:  Recent Labs Lab 11/10/16 1531 11/11/16 0625 11/12/16 0434 11/12/16 1753 11/13/16 0519 11/14/16 0658 11/15/16 0457  NA 142 144 144 143 145 150* 149*  K 4.1 3.4* 3.5 3.4* 3.7 3.5 3.5  CL 108 105 104 104 104 108 109  CO2 27 30 31 31  32 33* 30  GLUCOSE 260* 156* 205* 232* 201* 206* 190*  BUN 53* 51* 62* 60* 61* 61* 65*  CREATININE 1.54* 1.72* 2.03* 1.89* 1.87* 1.67* 1.70*  CALCIUM 9.9 10.4* 10.1 10.0 10.1 10.3 10.3  MG 2.0 2.1 2.0 2.0  --  1.9  --    Liver Function Tests:  Recent Labs Lab 11/10/16 0612 11/11/16 0625  AST 42* 19  ALT 15 16  ALKPHOS 99 110  BILITOT 0.7 0.7  PROT 6.0* 7.4  ALBUMIN 2.1* 2.5*   No results for input(s): LIPASE, AMYLASE in the last 168 hours. No results for input(s): AMMONIA in the last 168 hours. CBC:  Recent Labs Lab 11/10/16 0939 11/11/16 0625 11/12/16 0434 11/13/16 0519 11/14/16 0658 11/15/16 0457  WBC 11.1* 9.1 8.0 8.2 9.6 8.9  NEUTROABS 8.4* 6.9  --   --   --    --   HGB 8.7* 9.0* 8.6* 9.3* 9.0* 8.7*  HCT 30.3* 31.7* 30.6* 32.7* 32.7* 31.2*  MCV 84.2 83.9 84.8 85.2 86.5 86.4  PLT 214 245 216 231 224 227   Cardiac Enzymes: No results for input(s): CKTOTAL, CKMB, CKMBINDEX, TROPONINI in the last 168 hours. BNP: Invalid input(s): POCBNP CBG:  Recent Labs Lab 11/15/16 0029 11/15/16 0436 11/15/16 0758 11/15/16 1224 11/15/16 1244  GLUCAP 219* 169* 197* 178* 191*   D-Dimer No results for input(s): DDIMER in the last 72 hours. Hgb A1c No results for input(s): HGBA1C in the last 72 hours. Lipid Profile No results for input(s): CHOL, HDL, LDLCALC, TRIG, CHOLHDL, LDLDIRECT in the last 72 hours. Thyroid function studies No results for input(s): TSH, T4TOTAL, T3FREE, THYROIDAB in the last 72 hours.  Invalid input(s): FREET3 Anemia work up No results for input(s): VITAMINB12, FOLATE, FERRITIN, TIBC, IRON, RETICCTPCT in the last 72 hours. Urinalysis    Component Value Date/Time   COLORURINE YELLOW 11/03/2016 2236   APPEARANCEUR HAZY (A) 11/03/2016 2236   LABSPEC 1.018 11/03/2016 2236   PHURINE 5.0 11/03/2016 2236   GLUCOSEU 50 (A) 11/03/2016 2236   HGBUR NEGATIVE 11/03/2016 2236   BILIRUBINUR NEGATIVE 11/03/2016 2236   KETONESUR NEGATIVE 11/03/2016 2236   PROTEINUR 100 (A) 11/03/2016 2236   UROBILINOGEN 0.2 02/02/2013 2008   NITRITE NEGATIVE 11/03/2016 2236   LEUKOCYTESUR MODERATE (A) 11/03/2016 2236   Sepsis Labs Invalid input(s): PROCALCITONIN,  WBC,  LACTICIDVEN Microbiology No results found for this or any previous visit (from the past 240 hour(s)).   Time coordinating discharge: 35 minutes  SIGNED:  Chipper Oman, MD  [facility] 11/15/2016, 3:30 PM  Pager please text page via  [email or web address] Password [email or web address]

## 2016-11-15 NOTE — Progress Notes (Signed)
Casey [name] to be D/C'd to [facility] SNF per MD order. Report called to nurse Casey [name] at [facility].  Allergies as of 11/15/2016      Reactions   Penicillins Other (See Comments)   Told by a doctor to "not take" and on MAR as an allergy Has patient had a PCN reaction causing immediate rash, facial/tongue/throat swelling, SOB or lightheadedness with hypotension: Unknown Has patient had a PCN reaction causing severe rash involving mucus membranes or skin necrosis: Unknown Has patient had a PCN reaction that required hospitalization: Unknown Has patient had a PCN reaction occurring within the last 10 years: Unknown If all of the above answers are "NO", then may proceed with Ceph      Medication List    STOP taking these medications   diltiazem 30 MG tablet Commonly known as:  CARDIZEM   levofloxacin 750 MG/150ML Soln Commonly known as:  LEVAQUIN   metoCLOPramide 5 MG tablet Commonly known as:  REGLAN   zinc sulfate 220 (50 Zn) MG capsule     TAKE these medications   allopurinol 100 MG tablet Commonly known as:  ZYLOPRIM Place 100 mg into feeding tube daily.   Amantadine HCl 100 MG tablet Place 100 mg into feeding tube 2 (two) times daily.   atorvastatin 10 MG tablet Commonly known as:  LIPITOR Place 10 mg into feeding tube at bedtime.   donepezil 10 MG tablet Commonly known as:  ARICEPT Place 10 mg into feeding tube at bedtime.   feeding supplement (PRO-STAT SUGAR FREE 64) Liqd Place 30 mLs into feeding tube 2 (two) times daily.   ferrous sulfate 325 (65 FE) MG tablet Give 1 tablet via G-Tube tow times daily   free water Soln Place 100 mLs into feeding tube every 8 (eight) hours.   HUMALOG 100 UNIT/ML injection Generic drug:  insulin lispro Inject 5 Units into the skin every 6 (six) hours.   insulin glargine 100 unit/mL Sopn Commonly known as:  LANTUS Inject 7 Units into the skin at bedtime.   levETIRAcetam 250 MG tablet Commonly known as:   KEPPRA Give 1 tablet via G-Tube two times daily   metoprolol tartrate 100 MG tablet Commonly known as:  LOPRESSOR Place 1 tablet (100 mg total) into feeding tube 2 (two) times daily. What changed:  medication strength  how much to take   mirtazapine 7.5 MG tablet Commonly known as:  REMERON Place 7.5 mg into feeding tube at bedtime.   pantoprazole 40 MG tablet Commonly known as:  PROTONIX Give 1 tablet via G-Tube tow times a day   THERA-M Tabs 1 tablet by PEG Tube route daily.       VVS, Skin clean and dry, pressure injuries to bottom and bilateral heels with foam in place.  IV catheter discontinued intact. Feeding tube disconnected. An After Visit Summary was printed and given to transport services. Patient escorted via stretcher.   Casey [name]  11/15/2016 5:00 PM

## 2016-11-16 ENCOUNTER — Encounter (HOSPITAL_COMMUNITY): Payer: Self-pay | Admitting: Emergency Medicine

## 2016-11-16 ENCOUNTER — Inpatient Hospital Stay (HOSPITAL_COMMUNITY)
Admission: EM | Admit: 2016-11-16 | Discharge: 2016-11-20 | DRG: 871 | Disposition: A | Discharge status: Hospice | Payer: Medicare Other | Attending: Internal Medicine | Admitting: Internal Medicine

## 2016-11-16 ENCOUNTER — Inpatient Hospital Stay (HOSPITAL_COMMUNITY): Payer: Medicare Other

## 2016-11-16 ENCOUNTER — Emergency Department (HOSPITAL_COMMUNITY): Payer: Medicare Other

## 2016-11-16 DIAGNOSIS — I5022 Chronic systolic (congestive) heart failure: Secondary | ICD-10-CM | POA: Diagnosis not present

## 2016-11-16 DIAGNOSIS — E870 Hyperosmolality and hypernatremia: Secondary | ICD-10-CM | POA: Diagnosis present

## 2016-11-16 DIAGNOSIS — E874 Mixed disorder of acid-base balance: Secondary | ICD-10-CM | POA: Diagnosis present

## 2016-11-16 DIAGNOSIS — F0390 Unspecified dementia without behavioral disturbance: Secondary | ICD-10-CM | POA: Diagnosis not present

## 2016-11-16 DIAGNOSIS — E1151 Type 2 diabetes mellitus with diabetic peripheral angiopathy without gangrene: Secondary | ICD-10-CM | POA: Diagnosis present

## 2016-11-16 DIAGNOSIS — L89629 Pressure ulcer of left heel, unspecified stage: Secondary | ICD-10-CM | POA: Diagnosis present

## 2016-11-16 DIAGNOSIS — Z515 Encounter for palliative care: Secondary | ICD-10-CM | POA: Diagnosis not present

## 2016-11-16 DIAGNOSIS — F329 Major depressive disorder, single episode, unspecified: Secondary | ICD-10-CM | POA: Diagnosis present

## 2016-11-16 DIAGNOSIS — E440 Moderate protein-calorie malnutrition: Secondary | ICD-10-CM | POA: Diagnosis present

## 2016-11-16 DIAGNOSIS — A419 Sepsis, unspecified organism: Principal | ICD-10-CM | POA: Diagnosis present

## 2016-11-16 DIAGNOSIS — Z87891 Personal history of nicotine dependence: Secondary | ICD-10-CM

## 2016-11-16 DIAGNOSIS — I5023 Acute on chronic systolic (congestive) heart failure: Secondary | ICD-10-CM | POA: Diagnosis not present

## 2016-11-16 DIAGNOSIS — I4891 Unspecified atrial fibrillation: Secondary | ICD-10-CM | POA: Diagnosis present

## 2016-11-16 DIAGNOSIS — R627 Adult failure to thrive: Secondary | ICD-10-CM | POA: Diagnosis present

## 2016-11-16 DIAGNOSIS — N183 Chronic kidney disease, stage 3 unspecified: Secondary | ICD-10-CM | POA: Diagnosis present

## 2016-11-16 DIAGNOSIS — I130 Hypertensive heart and chronic kidney disease with heart failure and stage 1 through stage 4 chronic kidney disease, or unspecified chronic kidney disease: Secondary | ICD-10-CM | POA: Diagnosis present

## 2016-11-16 DIAGNOSIS — Z931 Gastrostomy status: Secondary | ICD-10-CM

## 2016-11-16 DIAGNOSIS — I82629 Acute embolism and thrombosis of deep veins of unspecified upper extremity: Secondary | ICD-10-CM | POA: Diagnosis present

## 2016-11-16 DIAGNOSIS — Z856 Personal history of leukemia: Secondary | ICD-10-CM

## 2016-11-16 DIAGNOSIS — E860 Dehydration: Secondary | ICD-10-CM | POA: Diagnosis present

## 2016-11-16 DIAGNOSIS — N179 Acute kidney failure, unspecified: Secondary | ICD-10-CM | POA: Diagnosis not present

## 2016-11-16 DIAGNOSIS — Z79899 Other long term (current) drug therapy: Secondary | ICD-10-CM

## 2016-11-16 DIAGNOSIS — I420 Dilated cardiomyopathy: Secondary | ICD-10-CM | POA: Diagnosis present

## 2016-11-16 DIAGNOSIS — I2510 Atherosclerotic heart disease of native coronary artery without angina pectoris: Secondary | ICD-10-CM | POA: Diagnosis present

## 2016-11-16 DIAGNOSIS — D649 Anemia, unspecified: Secondary | ICD-10-CM | POA: Diagnosis not present

## 2016-11-16 DIAGNOSIS — G40909 Epilepsy, unspecified, not intractable, without status epilepticus: Secondary | ICD-10-CM | POA: Diagnosis present

## 2016-11-16 DIAGNOSIS — F0150 Vascular dementia without behavioral disturbance: Secondary | ICD-10-CM | POA: Diagnosis present

## 2016-11-16 DIAGNOSIS — Z6826 Body mass index (BMI) 26.0-26.9, adult: Secondary | ICD-10-CM

## 2016-11-16 DIAGNOSIS — L89619 Pressure ulcer of right heel, unspecified stage: Secondary | ICD-10-CM | POA: Diagnosis present

## 2016-11-16 DIAGNOSIS — M109 Gout, unspecified: Secondary | ICD-10-CM | POA: Diagnosis present

## 2016-11-16 DIAGNOSIS — E7800 Pure hypercholesterolemia, unspecified: Secondary | ICD-10-CM | POA: Diagnosis present

## 2016-11-16 DIAGNOSIS — E1140 Type 2 diabetes mellitus with diabetic neuropathy, unspecified: Secondary | ICD-10-CM | POA: Diagnosis present

## 2016-11-16 DIAGNOSIS — I739 Peripheral vascular disease, unspecified: Secondary | ICD-10-CM | POA: Diagnosis present

## 2016-11-16 DIAGNOSIS — J9622 Acute and chronic respiratory failure with hypercapnia: Secondary | ICD-10-CM | POA: Diagnosis present

## 2016-11-16 DIAGNOSIS — E1122 Type 2 diabetes mellitus with diabetic chronic kidney disease: Secondary | ICD-10-CM | POA: Diagnosis present

## 2016-11-16 DIAGNOSIS — F0280 Dementia in other diseases classified elsewhere without behavioral disturbance: Secondary | ICD-10-CM | POA: Diagnosis not present

## 2016-11-16 DIAGNOSIS — I482 Chronic atrial fibrillation: Secondary | ICD-10-CM | POA: Diagnosis present

## 2016-11-16 DIAGNOSIS — D631 Anemia in chronic kidney disease: Secondary | ICD-10-CM | POA: Diagnosis present

## 2016-11-16 DIAGNOSIS — R6520 Severe sepsis without septic shock: Secondary | ICD-10-CM | POA: Diagnosis present

## 2016-11-16 DIAGNOSIS — I4581 Long QT syndrome: Secondary | ICD-10-CM | POA: Diagnosis present

## 2016-11-16 DIAGNOSIS — G301 Alzheimer's disease with late onset: Secondary | ICD-10-CM | POA: Diagnosis not present

## 2016-11-16 DIAGNOSIS — J690 Pneumonitis due to inhalation of food and vomit: Secondary | ICD-10-CM | POA: Diagnosis present

## 2016-11-16 DIAGNOSIS — Z8673 Personal history of transient ischemic attack (TIA), and cerebral infarction without residual deficits: Secondary | ICD-10-CM

## 2016-11-16 DIAGNOSIS — N390 Urinary tract infection, site not specified: Secondary | ICD-10-CM | POA: Diagnosis present

## 2016-11-16 DIAGNOSIS — Z66 Do not resuscitate: Secondary | ICD-10-CM | POA: Diagnosis not present

## 2016-11-16 DIAGNOSIS — H548 Legal blindness, as defined in USA: Secondary | ICD-10-CM | POA: Diagnosis present

## 2016-11-16 DIAGNOSIS — Z794 Long term (current) use of insulin: Secondary | ICD-10-CM

## 2016-11-16 DIAGNOSIS — G9341 Metabolic encephalopathy: Secondary | ICD-10-CM | POA: Diagnosis present

## 2016-11-16 DIAGNOSIS — Z880 Allergy status to penicillin: Secondary | ICD-10-CM

## 2016-11-16 DIAGNOSIS — K219 Gastro-esophageal reflux disease without esophagitis: Secondary | ICD-10-CM | POA: Diagnosis present

## 2016-11-16 LAB — COMPREHENSIVE METABOLIC PANEL
ALT: 18 U/L (ref 14–54)
AST: 31 U/L (ref 15–41)
Albumin: 2.7 g/dL — ABNORMAL LOW (ref 3.5–5.0)
Alkaline Phosphatase: 101 U/L (ref 38–126)
Anion gap: 8 (ref 5–15)
BUN: 65 mg/dL — AB (ref 6–20)
CALCIUM: 10.7 mg/dL — AB (ref 8.9–10.3)
CO2: 35 mmol/L — ABNORMAL HIGH (ref 22–32)
CREATININE: 1.65 mg/dL — AB (ref 0.44–1.00)
Chloride: 111 mmol/L (ref 101–111)
GFR calc Af Amer: 34 mL/min — ABNORMAL LOW (ref >60)
GFR, EST NON AFRICAN AMERICAN: 29 mL/min — AB (ref >60)
GLUCOSE: 190 mg/dL — AB (ref 65–99)
Potassium: 3.8 mmol/L (ref 3.5–5.1)
Sodium: 154 mmol/L — ABNORMAL HIGH (ref 135–145)
TOTAL PROTEIN: 7.3 g/dL (ref 6.5–8.1)
Total Bilirubin: 0.6 mg/dL (ref 0.3–1.2)

## 2016-11-16 LAB — CBC WITH DIFFERENTIAL/PLATELET
BASOS ABS: 0.1 10*3/uL (ref 0.0–0.1)
BASOS PCT: 1 %
EOS ABS: 0.1 10*3/uL (ref 0.0–0.7)
EOS PCT: 1 %
HCT: 32.7 % — ABNORMAL LOW (ref 36.0–46.0)
Hemoglobin: 9 g/dL — ABNORMAL LOW (ref 12.0–15.0)
LYMPHS PCT: 10 %
Lymphs Abs: 0.9 10*3/uL (ref 0.7–4.0)
MCH: 23.6 pg — ABNORMAL LOW (ref 26.0–34.0)
MCHC: 27.5 g/dL — ABNORMAL LOW (ref 30.0–36.0)
MCV: 85.8 fL (ref 78.0–100.0)
MONO ABS: 0.9 10*3/uL (ref 0.1–1.0)
Monocytes Relative: 10 %
Neutro Abs: 7.2 10*3/uL (ref 1.7–7.7)
Neutrophils Relative %: 78 %
PLATELETS: 212 10*3/uL (ref 150–400)
RBC: 3.81 MIL/uL — AB (ref 3.87–5.11)
RDW: 18.2 % — AB (ref 11.5–15.5)
WBC: 9.2 10*3/uL (ref 4.0–10.5)

## 2016-11-16 LAB — URINALYSIS, ROUTINE W REFLEX MICROSCOPIC
Bilirubin Urine: NEGATIVE
Glucose, UA: NEGATIVE mg/dL
HGB URINE DIPSTICK: NEGATIVE
Ketones, ur: NEGATIVE mg/dL
Nitrite: NEGATIVE
Protein, ur: 30 mg/dL — AB
RBC / HPF: NONE SEEN RBC/hpf (ref 0–5)
SPECIFIC GRAVITY, URINE: 1.015 (ref 1.005–1.030)
SQUAMOUS EPITHELIAL / LPF: NONE SEEN
pH: 5 (ref 5.0–8.0)

## 2016-11-16 LAB — CBC
HEMATOCRIT: 32.4 % — AB (ref 36.0–46.0)
HEMOGLOBIN: 9 g/dL — AB (ref 12.0–15.0)
MCH: 24 pg — AB (ref 26.0–34.0)
MCHC: 27.8 g/dL — ABNORMAL LOW (ref 30.0–36.0)
MCV: 86.4 fL (ref 78.0–100.0)
PLATELETS: 214 10*3/uL (ref 150–400)
RBC: 3.75 MIL/uL — AB (ref 3.87–5.11)
RDW: 17.8 % — ABNORMAL HIGH (ref 11.5–15.5)
WBC: 8.8 10*3/uL (ref 4.0–10.5)

## 2016-11-16 LAB — GLUCOSE, CAPILLARY: Glucose-Capillary: 188 mg/dL — ABNORMAL HIGH (ref 65–99)

## 2016-11-16 LAB — I-STAT ARTERIAL BLOOD GAS, ED
ACID-BASE EXCESS: 13 mmol/L — AB (ref 0.0–2.0)
BICARBONATE: 39.5 mmol/L — AB (ref 20.0–28.0)
O2 Saturation: 97 %
PO2 ART: 94 mmHg (ref 83.0–108.0)
Patient temperature: 99.5
TCO2: 41 mmol/L — ABNORMAL HIGH (ref 22–32)
pCO2 arterial: 63.2 mmHg — ABNORMAL HIGH (ref 32.0–48.0)
pH, Arterial: 7.407 (ref 7.350–7.450)

## 2016-11-16 LAB — I-STAT TROPONIN, ED: TROPONIN I, POC: 0.37 ng/mL — AB (ref 0.00–0.08)

## 2016-11-16 LAB — I-STAT CG4 LACTIC ACID, ED
LACTIC ACID, VENOUS: 2.08 mmol/L — AB (ref 0.5–1.9)
LACTIC ACID, VENOUS: 1.31 mmol/L (ref 0.5–1.9)

## 2016-11-16 LAB — PROTIME-INR
INR: 1.3
PROTHROMBIN TIME: 16.1 s — AB (ref 11.4–15.2)

## 2016-11-16 LAB — MAGNESIUM: MAGNESIUM: 2.3 mg/dL (ref 1.7–2.4)

## 2016-11-16 LAB — CREATININE, SERUM
Creatinine, Ser: 1.66 mg/dL — ABNORMAL HIGH (ref 0.44–1.00)
GFR calc Af Amer: 33 mL/min — ABNORMAL LOW (ref >60)
GFR calc non Af Amer: 29 mL/min — ABNORMAL LOW (ref >60)

## 2016-11-16 LAB — APTT: aPTT: 30 seconds (ref 24–36)

## 2016-11-16 LAB — PROCALCITONIN: Procalcitonin: 0.45 ng/mL

## 2016-11-16 LAB — TSH: TSH: 4.9 u[IU]/mL — AB (ref 0.350–4.500)

## 2016-11-16 MED ORDER — ACETAMINOPHEN 650 MG RE SUPP
650.0000 mg | RECTAL | Status: DC | PRN
  Administered 2016-11-16: 650 mg via RECTAL
  Filled 2016-11-16: qty 1

## 2016-11-16 MED ORDER — DILTIAZEM HCL 100 MG IV SOLR
5.0000 mg/h | INTRAVENOUS | Status: DC
  Administered 2016-11-17 – 2016-11-18 (×4): 15 mg/h via INTRAVENOUS
  Administered 2016-11-18 – 2016-11-19 (×3): 10 mg/h via INTRAVENOUS
  Filled 2016-11-16 (×11): qty 100

## 2016-11-16 MED ORDER — ATORVASTATIN CALCIUM 10 MG PO TABS
10.0000 mg | ORAL_TABLET | Freq: Every day | ORAL | Status: DC
  Administered 2016-11-16 – 2016-11-17 (×2): 10 mg
  Filled 2016-11-16 (×2): qty 1

## 2016-11-16 MED ORDER — INSULIN ASPART 100 UNIT/ML ~~LOC~~ SOLN
0.0000 [IU] | Freq: Three times a day (TID) | SUBCUTANEOUS | Status: DC
  Administered 2016-11-17 (×2): 2 [IU] via SUBCUTANEOUS
  Administered 2016-11-17 – 2016-11-18 (×3): 3 [IU] via SUBCUTANEOUS

## 2016-11-16 MED ORDER — DONEPEZIL HCL 5 MG PO TABS
10.0000 mg | ORAL_TABLET | Freq: Every day | ORAL | Status: DC
  Administered 2016-11-16 – 2016-11-17 (×2): 10 mg
  Filled 2016-11-16 (×2): qty 2

## 2016-11-16 MED ORDER — MIRTAZAPINE 15 MG PO TABS
7.5000 mg | ORAL_TABLET | Freq: Every day | ORAL | Status: DC
  Administered 2016-11-16 – 2016-11-19 (×4): 7.5 mg
  Filled 2016-11-16 (×4): qty 1

## 2016-11-16 MED ORDER — PANTOPRAZOLE SODIUM 40 MG PO TBEC: 40.0000 mg | DELAYED_RELEASE_TABLET | Freq: Every day | ORAL | Status: DC

## 2016-11-16 MED ORDER — PIPERACILLIN-TAZOBACTAM 3.375 G IVPB
3.3750 g | Freq: Three times a day (TID) | INTRAVENOUS | Status: DC
  Administered 2016-11-16 – 2016-11-18 (×5): 3.375 g via INTRAVENOUS
  Filled 2016-11-16 (×6): qty 50

## 2016-11-16 MED ORDER — VANCOMYCIN HCL IN DEXTROSE 1-5 GM/200ML-% IV SOLN
1000.0000 mg | Freq: Once | INTRAVENOUS | Status: AC
  Administered 2016-11-16: 1000 mg via INTRAVENOUS
  Filled 2016-11-16: qty 200

## 2016-11-16 MED ORDER — VANCOMYCIN HCL IN DEXTROSE 750-5 MG/150ML-% IV SOLN
750.0000 mg | INTRAVENOUS | Status: DC
  Administered 2016-11-17 – 2016-11-18 (×2): 750 mg via INTRAVENOUS
  Filled 2016-11-16 (×2): qty 150

## 2016-11-16 MED ORDER — DILTIAZEM LOAD VIA INFUSION
10.0000 mg | Freq: Once | INTRAVENOUS | Status: DC
  Filled 2016-11-16: qty 10

## 2016-11-16 MED ORDER — INSULIN GLARGINE 100 UNIT/ML ~~LOC~~ SOLN
7.0000 [IU] | Freq: Every day | SUBCUTANEOUS | Status: DC
  Administered 2016-11-16 – 2016-11-17 (×2): 7 [IU] via SUBCUTANEOUS
  Filled 2016-11-16 (×3): qty 0.07

## 2016-11-16 MED ORDER — AZTREONAM 2 G IJ SOLR: 2.0000 g | Freq: Once | INTRAMUSCULAR | Status: DC

## 2016-11-16 MED ORDER — DILTIAZEM HCL 25 MG/5ML IV SOLN
10.0000 mg | Freq: Once | INTRAVENOUS | Status: AC
  Administered 2016-11-16: 10 mg via INTRAVENOUS
  Filled 2016-11-16: qty 5

## 2016-11-16 MED ORDER — HEPARIN SODIUM (PORCINE) 5000 UNIT/ML IJ SOLN
5000.0000 [IU] | Freq: Three times a day (TID) | INTRAMUSCULAR | Status: DC
  Administered 2016-11-16 – 2016-11-18 (×6): 5000 [IU] via SUBCUTANEOUS
  Filled 2016-11-16 (×6): qty 1

## 2016-11-16 MED ORDER — LEVOFLOXACIN IN D5W 750 MG/150ML IV SOLN: 750.0000 mg | Freq: Once | INTRAVENOUS | Status: DC

## 2016-11-16 MED ORDER — AMANTADINE HCL 100 MG PO CAPS
100.0000 mg | ORAL_CAPSULE | Freq: Two times a day (BID) | ORAL | Status: DC
  Administered 2016-11-16 – 2016-11-19 (×6): 100 mg
  Filled 2016-11-16 (×6): qty 1

## 2016-11-16 MED ORDER — PIPERACILLIN-TAZOBACTAM IN DEX 2-0.25 GM/50ML IV SOLN
2.2500 g | Freq: Three times a day (TID) | INTRAVENOUS | Status: DC
  Administered 2016-11-16: 2.25 g via INTRAVENOUS
  Filled 2016-11-16 (×2): qty 50

## 2016-11-16 MED ORDER — SODIUM CHLORIDE 0.9 % IV SOLN
INTRAVENOUS | Status: AC
  Administered 2016-11-16: 17:00:00 via INTRAVENOUS

## 2016-11-16 MED ORDER — LEVETIRACETAM 250 MG PO TABS
250.0000 mg | ORAL_TABLET | Freq: Two times a day (BID) | ORAL | Status: DC
  Administered 2016-11-16: 250 mg via ORAL
  Filled 2016-11-16 (×2): qty 1

## 2016-11-16 MED ORDER — INSULIN GLARGINE 100 UNITS/ML SOLOSTAR PEN
7.0000 [IU] | PEN_INJECTOR | Freq: Every day | SUBCUTANEOUS | Status: DC
  Filled 2016-11-16: qty 3

## 2016-11-16 MED ORDER — METOPROLOL TARTRATE 25 MG PO TABS
25.0000 mg | ORAL_TABLET | Freq: Two times a day (BID) | ORAL | Status: DC
  Administered 2016-11-16 – 2016-11-18 (×4): 25 mg
  Filled 2016-11-16 (×5): qty 1

## 2016-11-16 NOTE — ED Triage Notes (Signed)
Patient arrived to ED via [facility] from [place]. EMS reports:  Called to facility by staff for HR ranging to 180s. Patient has history of AF, CVA, Dementia. Answers to own name, and able to answer simple questions. Baseline. Edema noted on R arm and R leg.   BP 146/74. Pulse 110-180. Afib. Resp 22, 100% on 2 LPM via nasal cannula. CBG 216.

## 2016-11-16 NOTE — ED Notes (Signed)
Patient transported to CT 

## 2016-11-16 NOTE — Progress Notes (Signed)
Pharmacy Antibiotic Note  Casey [name] is a 76 y.o. female admitted on 11/16/2016 with sepsis.  Pharmacy dosing vancomycin and zosyn. Renal fx improving. CrCl ~ 25-30 mL/min.    Plan: Continue Vancomycin  750mg  IV q24h  Increase zosyn to 3.375 gm IV Q 8 hours  Follow c/s, clinical progression, de-escalation/LOT, renal function, level PRN    Temp (24hrs), Avg:100.7 F (38.2 C), Min:100.7 F (38.2 C), Max:100.7 F (38.2 C)   Recent Labs Lab 11/13/16 0519 11/14/16 0658 11/15/16 0457 11/16/16 1010 11/16/16 1026 11/16/16 1252 11/16/16 1555  WBC 8.2 9.6 8.9 9.2  --   --  8.8  CREATININE 1.87* 1.67* 1.70* 1.65*  --   --  1.66*  LATICACIDVEN  --   --   --   --  2.08* 1.31  --     Estimated Creatinine Clearance: 25.9 mL/min (A) (by C-G formula based on SCr of 1.66 mg/dL (H)).    Allergies  Allergen Reactions  . Penicillins Other (See Comments)    Has patient had a PCN reaction causing immediate rash, facial/tongue/throat swelling, SOB or lightheadedness with hypotension: Unk Has patient had a PCN reaction causing severe rash involving mucus membranes or skin necrosis: Unk Has patient had a PCN reaction that required hospitalization: Unk Has patient had a PCN reaction occurring within the last 10 years: Unk If all of the above answers are "NO", then may proceed with Ceph *has tolerated cephalosporins, amoxicillin, and Zosyn*    Antimicrobials this admission: Vancomycin 10/19 >>  Zosyn 10/19 >>   Dose adjustments this admission: n/a  Microbiology results: 10/19 BCx:    Thank you for allowing pharmacy to be a part of this patient's care.  Albertina Parr, PharmD., BCPS Clinical Pharmacist Pager [phone]

## 2016-11-16 NOTE — ED Notes (Signed)
Phlebotomy at bedside attempting to obtain 2nd set of blood cultures prior to IV antibiotic therapy. Delays d/t patient extremely difficult stick.

## 2016-11-16 NOTE — H&P (Addendum)
[facility] History and Physical  Casey [name] MOQ:[id number] DOB: [birth year] DOA: 11/16/2016  Referring physician: ED   PCP: Gildardo Cranker, DO   Chief Complaint altered mental status   HPI:   76 year old female with history of CVA, dementia, chronic kidney disease, seizure disorders, type 2 diabetes mellitus, chronic A. Fib, recently hospitalized and discharged on 10/18 to SNF after being hospitalized for altered mental status, UTI, aspiration pneumonia, A. Fib with RVR. Patient presents again today from the facility in less than 24 hours for heart rate of 180s, fever of 100.7, lethargy, encephalopathy,inability to communicate or onset any questions. Patient recently completed a seven-day course of levofloxacin for possible aspiration pneumonia .during this hospitalization patient was also treated for atrial fibrillation with rapid ventricular response, acute metabolic encephalopathy.patient has been gradually declining  Months.previous palliative care consultations have not resulted in any adjustment in her code status. Palliative care has had several meetings with the family. Patient now has a PEG tube that was placed on 9/20. Patient has had aspiration pneumonia despite PEG tube placement. ED course BP 122/62 (BP Location: Left Arm)   Pulse (!) 129   Temp (!) 100.7 F (38.2 C) (Rectal)   Resp (!) 24   SpO2 97%  lactic acid 2.08, troponin 0.37,sodium 154, creatinine 1.65, calcium 10.7, magnesium 2.3, white count 9.2, chest x-ray  Shows stable right upper lobe opacity.  I had an extensive discussion with the patient's son Legrand Como by the bedside. Patient is at the end of life, has not improved after a 12 day hospitalization, is likely not going to improve despite broad-spectrum IV antibiotics. Patient's daughter is driving out of town and will be here in 1 hour. It is anticipated that they will make decisions to transition to full comfort care in  a few hours.     Review of  Systems:   Review of Systems  Unable to perform ROS: Patient nonverbal     Past Medical History:  Diagnosis Date  . Abdominal or pelvic swelling, mass, or lump, left upper quadrant   . CHF (congestive heart failure) ([place])   . Chronic atrial fibrillation ([place])   . Chronic kidney disease   . Congestive heart failure, unspecified   . Coronary atherosclerosis of unspecified type of vessel, native or graft   . Diabetes mellitus   . Edema   . GERD (gastroesophageal reflux disease)   . Gout, unspecified   . Hypercholesterolemia   . Hypertension   . 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  . Peripheral vascular disease, unspecified ([place])   . Seizures ([place])   . Sinus of Valsalva aneurysm    a. By 2D echo 05/2011.  . Stroke ([place])   . Unspecified hereditary and idiopathic peripheral neuropathy   . Unspecified urinary incontinence   . Vascular dementia, uncomplicated      Past Surgical History:  Procedure Laterality Date  . AORTOGRAM  09/01/2008   Abd w/ bilateral lower extremity runoff arteriography  . APPLICATION OF WOUND VAC Left 08/30/2016   Procedure: APPLICATION OF WOUND VAC LEFT GROIN;  Surgeon: Serafina Mitchell, MD;  Location: [place];  Service: Vascular;  Laterality: Left;  . ENDARTERECTOMY FEMORAL Left 08/30/2016   Procedure: LEFT COMMON FEMORAL ARTERY ENDARTERECTOMY;  Surgeon: Serafina Mitchell, MD;  Location: [place];  Service: Vascular;  Laterality: Left;  . FALSE  ANEURYSM REPAIR  12/06/2011   Procedure: REPAIR FALSE ANEURYSM;  Surgeon: Angelia Mould, MD;  Location: [facility] OR;  Service: Vascular;  Laterality: Left;  Repair of left femoral Artery pseudoaneurysm  . FALSE ANEURYSM REPAIR Left 08/30/2016   Procedure: REPAIR PSEUDOANEURYSM LEFT GROIN;  Surgeon: Serafina Mitchell, MD;  Location: [place];  Service: Vascular;  Laterality: Left;   . FEMORAL ARTERY EXPLORATION  12/06/2011   Procedure: FEMORAL ARTERY EXPLORATION;  Surgeon: Angelia Mould, MD;  Location[facility] OR;  Service: Vascular;  Laterality: N/A;  Exploration of large pseudoaneurysm left side of fem-fem bypass graft   . femoral to femoral bypass graft     . FEMORAL-FEMORAL BYPASS GRAFT  12/06/2011   Procedure: BYPASS GRAFT FEMORAL-FEMORAL ARTERY;  Surgeon: Angelia Mould, MD;  Location: [facility] OR;  Service: Vascular;  Laterality: Bilateral;  Revision of left to right Femoral-Femoral bypass graft  . FEMORAL-FEMORAL BYPASS GRAFT Left 08/30/2016   Procedure: REDO RIGHT TO LEFT FEMORAL-FEMORAL ARTERY BYPASS GRAFT;  Surgeon: Serafina Mitchell, MD;  Location: [place];  Service: Vascular;  Laterality: Left;  . FEMORAL-POPLITEAL BYPASS GRAFT Left 05/2002   lower extremity femoral to below knee w/ non-versed greater saphenous vein  . FEMORAL-POPLITEAL BYPASS GRAFT Left 11/2002  . FEMORAL-POPLITEAL BYPASS GRAFT Left 08/30/2016   Procedure: REVISION OF LEFT FEMORAL-POPLITEAL ARTERY BYPASS GRAFT;  Surgeon: Serafina Mitchell, MD;  Location: [facility] OR;  Service: Vascular;  Laterality: Left;  . IR GASTROSTOMY TUBE MOD SED  10/18/2016  . THROMBECTOMY FEMORAL ARTERY Left 08/30/2016   Procedure: THROMBECTOMY OF FEMORAL-FEMORAL ARTERY BYPASS GRAFT AND LEFT FEMORAL TO LEFT POPLITEAL BYPASS GRAFT;  Surgeon: Serafina Mitchell, MD;  Location: [place];  Service: Vascular;  Laterality: Left;  . TUBAL LIGATION Bilateral       Social History:  reports that she quit smoking about 15 years ago. Her smoking use included Cigarettes. She has never used smokeless tobacco. She reports that she does not drink alcohol or use drugs. *   Allergies  Allergen Reactions  . Penicillins Other (See Comments)    Has patient had a PCN reaction causing immediate rash, facial/tongue/throat swelling, SOB or lightheadedness with hypotension: Unk Has patient had a PCN reaction causing severe rash involving mucus membranes or  skin necrosis: Unk Has patient had a PCN reaction that required hospitalization: Unk Has patient had a PCN reaction occurring within the last 10 years: Unk If all of the above answers are "NO", then may proceed with Ceph *has tolerated cephalosporins, amoxicillin, and Zosyn*    Family History  Problem Relation Age of Onset  . Cancer Mother        Cervical  . Stroke Mother   . Diabetes Mother   . Stroke Father   . Heart disease Father         Prior to Admission medications   Medication Sig Start Date End Date Taking? Authorizing Provider  allopurinol (ZYLOPRIM) 100 MG tablet Place 100 mg into feeding tube daily.    Yes [provider]  Amantadine HCl 100 MG tablet Place 100 mg into feeding tube 2 (two) times daily.   Yes [provider]  atorvastatin (LIPITOR) 10 MG tablet Place 10 mg into feeding tube at bedtime.    Yes [provider]  donepezil (ARICEPT) 10 MG tablet Place 10 mg into feeding tube at bedtime.   Yes [provider]  ferrous sulfate 220 (44 Fe) MG/5ML solution Place 325 mg into feeding tube daily. 7.4 ML'S  Yes [provider]  insulin glargine (LANTUS) 100 unit/mL SOPN Inject 7 Units into the skin at bedtime.   Yes [provider]  levETIRAcetam (KEPPRA) 250 MG tablet Give 1 tablet via G-Tube two times daily   Yes [provider]  mirtazapine (REMERON) 7.5 MG tablet Place 7.5 mg into feeding tube at bedtime.    Yes [provider]  Multiple Vitamins-Minerals (THERA-M) TABS 1 tablet by PEG Tube route daily.   Yes [provider]  Water For Irrigation, Sterile (FREE WATER) SOLN Place 100 mLs into feeding tube every 8 (eight) hours. 11/15/16  Yes Patrecia Pour, Christean Grief, MD  Amino Acids-Protein Hydrolys (FEEDING SUPPLEMENT, PRO-STAT SUGAR FREE 64,) LIQD Place 30 mLs into feeding tube 2 (two) times daily.    [provider]  metoprolol tartrate (LOPRESSOR) 100 MG tablet Place 1 tablet  (100 mg total) into feeding tube 2 (two) times daily. Patient not taking: Reported on 11/16/2016 11/15/16   Patrecia Pour, Christean Grief, MD  pantoprazole (PROTONIX) 40 MG tablet Give 1 tablet via G-Tube tow times a day    [provider]     Physical Exam: Vitals:   11/16/16 1230 11/16/16 1300 11/16/16 1330 11/16/16 1400  BP: 114/72 119/78 120/76 130/71  Pulse: (!) 125 (!) 133 (!) 115 (!) 125  Resp: (!) 37 (!) 33 (!) 29 (!) 31  Temp:      TempSrc:      SpO2: 99% 99% 99% 99%        Vitals:   11/16/16 1230 11/16/16 1300 11/16/16 1330 11/16/16 1400  BP: 114/72 119/78 120/76 130/71  Pulse: (!) 125 (!) 133 (!) 115 (!) 125  Resp: (!) 37 (!) 33 (!) 29 (!) 31  Temp:      TempSrc:      SpO2: 99% 99% 99% 99%   Constitutional: She has a sickly appearance. She appears ill.  Eyes: PERRL, lids and conjunctivae normal ENMT: Mucous membranes are moist. Posterior pharynx clear of any exudate or lesions.Normal dentition.  Neck: normal, supple, no masses, no thyromegaly Cardiovascular: An irregularly irregular rhythm present. Tachycardia present.   Pulmonary/Chest: Tachypnea noted. She has no decreased breath sounds. She has no wheezes. She has no rhonchi. She has no rales.   Abdomen: no tenderness, no masses palpated. No hepatosplenomegaly. Bowel sounds positive.  Musculoskeletal: no clubbing / cyanosis. No joint deformity upper and lower extremities. Good ROM, no contractures. Normal muscle tone.  Skin: no rashes, lesions, ulcers. No induration Neurologic: CN 2-12 grossly intact. Sensation intact, DTR normal. Strength 5/5 in all 4.  Psychiatric: Normal judgment and insight. Alert and oriented x 3. Normal mood.     Labs on Admission: I have personally reviewed following labs and imaging studies  CBC:  Recent Labs Lab 11/10/16 0939 11/11/16 0625 11/12/16 0434 11/13/16 0519 11/14/16 0658 11/15/16 0457 11/16/16 1010  WBC 11.1* 9.1 8.0 8.2 9.6 8.9 9.2  NEUTROABS 8.4* 6.9  --   --    --   --  7.2  HGB 8.7* 9.0* 8.6* 9.3* 9.0* 8.7* 9.0*  HCT 30.3* 31.7* 30.6* 32.7* 32.7* 31.2* 32.7*  MCV 84.2 83.9 84.8 85.2 86.5 86.4 85.8  PLT 214 245 216 231 224 227 409    Basic Metabolic Panel:  Recent Labs Lab 11/11/16 0625 11/12/16 0434 11/12/16 1753 11/13/16 0519 11/14/16 0658 11/15/16 0457 11/16/16 1010  NA 144 144 143 145 150* 149* 154*  K 3.4* 3.5 3.4* 3.7 3.5 3.5 3.8  CL 105 104 104 104  108 109 111  CO2 30 31 31  32 33* 30 35*  GLUCOSE 156* 205* 232* 201* 206* 190* 190*  BUN 51* 62* 60* 61* 61* 65* 65*  CREATININE 1.72* 2.03* 1.89* 1.87* 1.67* 1.70* 1.65*  CALCIUM 10.4* 10.1 10.0 10.1 10.3 10.3 10.7*  MG 2.1 2.0 2.0  --  1.9  --  2.3    GFR: Estimated Creatinine Clearance: 26.1 mL/min (A) (by C-G formula based on SCr of 1.65 mg/dL (H)).  Liver Function Tests:  Recent Labs Lab 11/10/16 0612 11/11/16 0625 11/16/16 1010  AST 42* 19 31  ALT 15 16 18   ALKPHOS 99 110 101  BILITOT 0.7 0.7 0.6  PROT 6.0* 7.4 7.3  ALBUMIN 2.1* 2.5* 2.7*   No results for input(s): LIPASE, AMYLASE in the last 168 hours. No results for input(s): AMMONIA in the last 168 hours.  Coagulation Profile: No results for input(s): INR, PROTIME in the last 168 hours. No results for input(s): DDIMER in the last 72 hours.  Cardiac Enzymes: No results for input(s): CKTOTAL, CKMB, CKMBINDEX, TROPONINI in the last 168 hours.  BNP (last 3 results) No results for input(s): PROBNP in the last 8760 hours.  HbA1C: No results for input(s): HGBA1C in the last 72 hours. Lab Results  Component Value Date   HGBA1C 6.8 (H) 11/04/2016   HGBA1C 5.6 10/06/2016   HGBA1C 6.6 (H) 08/30/2016     CBG:  Recent Labs Lab 11/15/16 0436 11/15/16 0758 11/15/16 1224 11/15/16 1244 11/15/16 1652  GLUCAP 169* 197* 178* 191* 195*    Lipid Profile: No results for input(s): CHOL, HDL, LDLCALC, TRIG, CHOLHDL, LDLDIRECT in the last 72 hours.  Thyroid Function Tests: No results for input(s): TSH,  T4TOTAL, FREET4, T3FREE, THYROIDAB in the last 72 hours.  Anemia Panel: No results for input(s): VITAMINB12, FOLATE, FERRITIN, TIBC, IRON, RETICCTPCT in the last 72 hours.  Urine analysis:    Component Value Date/Time   COLORURINE YELLOW 11/16/2016 1211   APPEARANCEUR HAZY (A) 11/16/2016 1211   LABSPEC 1.015 11/16/2016 1211   PHURINE 5.0 11/16/2016 1211   GLUCOSEU NEGATIVE 11/16/2016 1211   HGBUR NEGATIVE 11/16/2016 1211   BILIRUBINUR NEGATIVE 11/16/2016 1211   KETONESUR NEGATIVE 11/16/2016 1211   PROTEINUR 30 (A) 11/16/2016 1211   UROBILINOGEN 0.2 02/02/2013 2008   NITRITE NEGATIVE 11/16/2016 1211   LEUKOCYTESUR TRACE (A) 11/16/2016 1211    Sepsis Labs: @LABRCNTIP (procalcitonin:4,lacticidven:4) )No results found for this or any previous visit (from the past 240 hour(s)).       Radiological Exams on Admission: Dg Chest Port 1 View  Result Date: 11/16/2016 CLINICAL DATA:  Fever. EXAM: PORTABLE CHEST 1 VIEW COMPARISON:  Radiograph of November 15, 2016. FINDINGS: Stable cardiomegaly. Atherosclerosis of thoracic aorta is noted. Stable mild central pulmonary vascular congestion is noted. No pneumothorax is noted. Mild right upper lobe atelectasis or infiltrate is noted. Mild bibasilar atelectasis or edema is noted with associated small pleural effusions. Bony thorax is unremarkable. IMPRESSION: Aortic atherosclerosis. Stable cardiomegaly with mild central pulmonary vascular congestion. Stable right upper lobe opacity is noted concerning for atelectasis or infiltrate. Mild bibasilar atelectasis or edema is noted with associated pleural effusions. Electronically Signed   By: Marijo Conception, M.D.   On: 11/16/2016 11:49   Dg Chest Port 1 View  Result Date: 11/15/2016 CLINICAL DATA:  Encounter for shortness of breath . EXAM: PORTABLE CHEST 1 VIEW COMPARISON:  11/11/2016 . FINDINGS: Cardiomegaly with pulmonary vascular prominence and bilateral interstitial prominence bilateral pleural  effusions. Findings consistent  congestive heart failure. Findings have progressed slightly from prior exam. IMPRESSION: Congestive heart failure bilateral from interstitial edema bilateral pleural effusions. Slight progression from prior exam. Electronically Signed   By: Marcello Moores  Register   On: 11/15/2016 11:32   Ir Gastrostomy Francesco Runner Mod Sed  Result Date: 10/18/2016 INDICATION: 76 year old female with dysphagia EXAM: IMAGE GUIDED GASTROSTOMY TUBE PLACEMENT MEDICATIONS: Vancomycin 1 gm IV; Antibiotics were administered within 1 hour of the procedure. Glucagon 1 mg IV ANESTHESIA/SEDATION: Versed 1.0 mg IV; Fentanyl 50 mcg IV Moderate Sedation Time:  15 minutes The patient was continuously monitored during the procedure by the interventional radiology nurse under my direct supervision. CONTRAST:  10 cc - administered into the gastric lumen. FLUOROSCOPY TIME:  Fluoroscopy Time: 1 minutes 24 seconds COMPLICATIONS: None PROCEDURE: Informed written consent was obtained from the patient and patient's family after a thorough discussion of the procedural risks, benefits and alternatives. All questions were addressed. Maximal Sterile Barrier Technique was utilized including caps, mask, sterile gowns, sterile gloves, sterile drape, hand hygiene and skin antiseptic. A timeout was performed prior to the initiation of the procedure. The procedure, risks, benefits, and alternatives were explained to the patient. Questions regarding the procedure were encouraged and answered. The patient understands and consents to the procedure. The epigastrium was prepped with Betadine in a sterile fashion, and a sterile drape was applied covering the operative field. A sterile gown and sterile gloves were used for the procedure. A 5-French orogastric tube is placed under fluoroscopic guidance. Scout imaging of the abdomen confirms barium within the transverse colon. The stomach was distended with gas. Under fluoroscopic guidance, an 18 gauge  needle was utilized to puncture the anterior wall of the body of the stomach. An Amplatz wire was advanced through the needle passing a T fastener into the lumen of the stomach. The T fastener was secured for gastropexy. A 9-French sheath was inserted. A snare was advanced through the 9-French sheath. A Britta Mccreedy was advanced through the orogastric tube. It was snared then pulled out the oral cavity, pulling the snare, as well. The leading edge of the gastrostomy was attached to the snare. It was then pulled down the esophagus and out the percutaneous site. It was secured in place. Contrast was injected. No complication IMPRESSION: Status post image guided percutaneous gastrostomy tube placement. Signed, Dulcy Fanny. Earleen Newport, DO [facility] Electronically Signed   By: Corrie Mckusick D.O.   On: 10/18/2016 17:56   Dg Chest Port 1 View  Result Date: 11/16/2016 CLINICAL DATA:  Fever. EXAM: PORTABLE CHEST 1 VIEW COMPARISON:  Radiograph of November 15, 2016. FINDINGS: Stable cardiomegaly. Atherosclerosis of thoracic aorta is noted. Stable mild central pulmonary vascular congestion is noted. No pneumothorax is noted. Mild right upper lobe atelectasis or infiltrate is noted. Mild bibasilar atelectasis or edema is noted with associated small pleural effusions. Bony thorax is unremarkable. IMPRESSION: Aortic atherosclerosis. Stable cardiomegaly with mild central pulmonary vascular congestion. Stable right upper lobe opacity is noted concerning for atelectasis or infiltrate. Mild bibasilar atelectasis or edema is noted with associated pleural effusions. Electronically Signed   By: Marijo Conception, M.D.   On: 11/16/2016 11:49   Dg Chest Port 1 View  Result Date: 11/15/2016 CLINICAL DATA:  Encounter for shortness of breath . EXAM: PORTABLE CHEST 1 VIEW COMPARISON:  11/11/2016 . FINDINGS: Cardiomegaly with pulmonary vascular prominence and bilateral interstitial  prominence bilateral pleural effusions. Findings consistent congestive heart failure. Findings have progressed slightly from prior exam.  IMPRESSION: Congestive heart failure bilateral from interstitial edema bilateral pleural effusions. Slight progression from prior exam. Electronically Signed   By: Marcello Moores  Register   On: 11/15/2016 11:32   Dg Chest Port 1 View  Result Date: 11/11/2016 CLINICAL DATA:  Dyspnea EXAM: PORTABLE CHEST 1 VIEW COMPARISON:  11/09/2016 FINDINGS: Marked cardiomegaly. Central airspace opacities likely represent edema. Small pleural effusions bilaterally. Moderate vascular and interstitial prominence. Little or no interval change from the prior. IMPRESSION: The findings likely represent congestive heart failure with interstitial and alveolar edema as well as bilateral effusions. Electronically Signed   By: Andreas Newport M.D.   On: 11/11/2016 03:09   Dg Chest Port 1 View  Result Date: 11/09/2016 CLINICAL DATA:  Shortness of breath, wheezing EXAM: PORTABLE CHEST 1 VIEW COMPARISON:  11/05/2016 FINDINGS: Cardiomegaly with vascular congestion. Bilateral lower lobe airspace opacities and effusions. Interstitial prominence, likely interstitial edema. IMPRESSION: Changes of CHF.  Small bilateral pleural effusions. Electronically Signed   By: Rolm Baptise M.D.   On: 11/09/2016 11:13   Dg Chest Port 1 View  Result Date: 11/05/2016 CLINICAL DATA:  Tachypnea.  History of CHF, former smoker, CLL EXAM: PORTABLE CHEST 1 VIEW COMPARISON:  Chest x-ray of November 03, 2016 FINDINGS: The lungs are well-expanded. There are small bilateral pleural effusions. The retrocardiac region on the left remains dense. The cardiac silhouette remains enlarged. The pulmonary vascularity is engorged centrally. There is bilateral hilar prominence. There is calcification in the wall of the thoracic aorta. The bony thorax exhibits no acute abnormality. IMPRESSION: CHF superimposed upon COPD and smoking related  changes. Left lower lobe atelectasis or pneumonia. Small bilateral pleural effusions. Thoracic aortic atherosclerosis. Electronically Signed   By: David  Martinique M.D.   On: 11/05/2016 08:41   Dg Chest Portable 1 View  Result Date: 11/03/2016 CLINICAL DATA:  Altered mental status, shortness of breath, hypertension, diabetes mellitus, former smoker, CHF, history lymphoma EXAM: PORTABLE CHEST 1 VIEW COMPARISON:  Portable exam 2204 hours compared to 10/29/2016 FINDINGS: Enlargement of cardiac silhouette with mild pulmonary vascular congestion. Atherosclerotic calcification aorta. Mediastinal contours and pulmonary vascularity normal. Persistent atelectasis versus infiltrate in LEFT lower lobe. Minimal perihilar edema. Peribronchial thickening persists. Mild RIGHT basilar atelectasis. No gross pleural effusion or pneumothorax. Bones demineralized. IMPRESSION: Enlargement of cardiac silhouette with pulmonary vascular congestion and question minimal pulmonary edema. RIGHT basilar atelectasis with persistent atelectasis versus consolidation in LEFT lower lobe. Electronically Signed   By: Lavonia Dana M.D.   On: 11/03/2016 22:33   Dg Chest Port 1 View  Result Date: 10/29/2016 CLINICAL DATA:  Respiratory failure EXAM: PORTABLE CHEST 1 VIEW COMPARISON:  10/28/2016 FINDINGS: The heart remains markedly enlarged. Pulmonary edema and vascular congestion are stable. Bilateral small pleural effusions are stable. No pneumothorax. Aortic atherosclerotic calcifications are again noted. IMPRESSION: Stable CHF with small bilateral pleural effusions. Electronically Signed   By: Marybelle Killings M.D.   On: 10/29/2016 07:28   Dg Chest Port 1 View  Result Date: 10/28/2016 CLINICAL DATA:  Shortness of breath.  Difficulty breathing. EXAM: PORTABLE CHEST 1 VIEW COMPARISON:  10/27/2016 and 10/25/2016. FINDINGS: 1301 hours. Cardiomegaly and aortic atherosclerosis are again noted. There is central enlargement of the pulmonary vasculature  with mild pulmonary edema and small bilateral pleural effusions. Probable head left-greater-than-right basilar atelectasis is unchanged. There is no pneumothorax. The bones appear stable. IMPRESSION: Little change from previous study. Persistent findings compatible with congestive heart failure with bilateral pleural effusions. Electronically Signed   By: Caryl Comes.D.  On: 10/28/2016 13:47   Dg Chest Port 1 View  Result Date: 10/27/2016 CLINICAL DATA:  Encounter for aspiration for pneumonia EXAM: PORTABLE CHEST 1 VIEW COMPARISON:  10/25/2016 FINDINGS: The heart is enlarged. There is pulmonary vascular congestion and probable mild edema. There are bilateral pleural effusions. Opacity at the left lung base partially obscures the hemidiaphragm, compatible with atelectasis, infiltrate, or edema. IMPRESSION: Cardiomegaly.  Probable mild edema. Left lower lobe atelectasis and/or edema. Electronically Signed   By: Nolon Nations M.D.   On: 10/27/2016 16:24   Dg Chest Port 1 View  Result Date: 10/25/2016 CLINICAL DATA:  Acute shortness of breath and wheezing. EXAM: PORTABLE CHEST 1 VIEW COMPARISON:  10/05/2016 and prior radiographs FINDINGS: Cardiomegaly and fullness of both hilar regions again noted. New interstitial opacities are present compatible with interstitial edema. There may be trace bilateral pleural effusions present. Mild bibasilar atelectasis noted. There is no evidence of pneumothorax. No acute bony abnormalities are identified. IMPRESSION: 1. New interstitial opacities compatible with interstitial pulmonary edema. Question trace bilateral pleural effusions. 2. Cardiomegaly. Electronically Signed   By: Margarette Canada M.D.   On: 10/25/2016 13:37      EKG: Independently reviewed.   Assessment/Plan Principal Problem:   Atrial fibrillation with RVR (HCC) Active Problems:   Atrial fibrillation (HCC)   Type 2 diabetes mellitus with diabetic neuropathy (HCC)   AKI (acute kidney injury)  ([place])   Dementia without behavioral disturbance   PVD (peripheral vascular disease) (HCC)   Chronic systolic congestive heart failure ([place])   CKD stage 3 due to type 2 diabetes mellitus ([place])   Type 2 diabetes mellitus with diabetic neuropathy, without long-term current use of insulin (HCC)   Anemia   Chronic kidney disease (CKD), stage III (moderate) (HCC)   Acute on chronic systolic CHF (congestive heart failure), NYHA class 4 (HCC)   Hypernatremia   Failure to thrive in adult     Sepsis ([place])  likely secondary to aspiration pneumonia, UA negative neck and patient started on broad-spectrum antibiotics , currently on Zosyn and vancomycin Check pro-calcitonin Sepsis workup initiated Follow urine culture, blood culture Continue broad-spectrum antibiotics Once family decides if they want to proceed with comfort care measures, DC antibiotics and IV hydration   chronic   systolic CHF due to over hydration  Acute on chronic respiratory failure Hydrate patient cautiously Patient with EF of 20-25%  Monitor fluid intake, resume Lasix if signs of fluid overload, likely will needed in the next 2-3 days   patient with poor prognosis not candidate for invasive procedures Continue O2 supplementation to keep O2 sat above 89%   Aspiration pneumonia Patient completed 7 days of antibiotic course with Levaquin. Blood cultures no growth Continue aspiration precautions , Continue medications through PEG tube  Discussed with family,. Probably continue to have aspiration events,  Moderate malnutrition We'll keep nothing by mouth for now, and nothing through PEG    Chronic A. fib Heart rate un  controlled, heart rate close to 150, started on Cardizem drip, continue Lopressor Patient not on anticoagulation given her history of GI bleed and poor prognosis. Patient was on amiodarone which was discontinued due to QTC prolongation   Acute metabolic encephalopathy Secondary to infection in  the  setting of underlying dementia    Chronic kidney disease stage III Creatinine at baseline Monitor BMP   Hypernatremia Likely secondary to poor free water intake and  dehydration  .   Right upper arm brachial vein DVT Patient has had recent  hospitalization for GI bleed, this made her not a good candidate for oral anticoagulation.  discussed risk and benefit of a/c, they decide not to treat with blood thinners.   Severe dementia  Continue current medications, Aricept and Amantadine -    Pressure ulcer on b/l heels - woc consult   Goals of care  long discussion with the patient's son Legrand Como by the bedside. He is currently awaiting his sister's arrival so that he can make a decision at to transition to full comfort care measures     DVT prophylaxis:  heparin     Code Status Orders        Start     Ordered     consults called none   Family Communication: Admission, patients condition and plan of care including tests being ordered have been discussed with the patient  who indicates understanding and agree with the plan and Code Status  Admission status: inpatient    Disposition plan: DNR  Further plan will depend as patient's clinical course evolves and further radiologic and laboratory data become available. Likely home when stable   At the time of admission, it appears that the appropriate admission status for this patient is INPATIENT .Thisis judged to be reasonable and necessary in order to provide the required intensity of service to ensure the patient's safetygiven thepresenting symptoms, physical exam findings, and initial radiographic and laboratory data in the context of their chronic comorbidities.   Reyne Dumas MD [facility] Pager [phone]  If 7PM-7AM, please contact night-coverage [email or web address] Password [email or web address]  11/16/2016, 2:23 PM

## 2016-11-16 NOTE — ED Notes (Signed)
Attempted to call report to [address] x 1. 

## 2016-11-16 NOTE — Progress Notes (Signed)
Pharmacy Antibiotic Note  Casey [name] is a 76 y.o. female admitted on 11/16/2016 with sepsis.  Pharmacy has been consulted for vancomycin, and Zosyn dosing.  Patient was discharged to [facility] form [facility] yesterday after an admission for AMS during which she was treated for aspiration pneumonia.  Vancomycin 1g IV x1 ordered by EDP.  Plan: Vancomycin  750mg  IV q24h starting 10/20 at 1000 Zosyn 2.25g IV q8h Follow c/s, clinical progression, de-escalation/LOT, renal function, level PRN    Temp (24hrs), Avg:99.5 F (37.5 C), Min:98.2 F (36.8 C), Max:100.7 F (38.2 C)   Recent Labs Lab 11/11/16 0625 11/12/16 0434 11/12/16 1753 11/13/16 0519 11/14/16 0658 11/15/16 0457  WBC 9.1 8.0  --  8.2 9.6 8.9  CREATININE 1.72* 2.03* 1.89* 1.87* 1.67* 1.70*    Estimated Creatinine Clearance: 25.3 mL/min (A) (by C-G formula based on SCr of 1.7 mg/dL (H)).    Allergies  Allergen Reactions  . Penicillins Other (See Comments)    Told by a doctor to "not take" and on MAR as an allergy Has patient had a PCN reaction causing immediate rash, facial/tongue/throat swelling, SOB or lightheadedness with hypotension: Unknown Has patient had a PCN reaction causing severe rash involving mucus membranes or skin necrosis: Unknown Has patient had a PCN reaction that required hospitalization: Unknown Has patient had a PCN reaction occurring within the last 10 years: Unknown If all of the above answers are "NO", then may proceed with Ceph    Antimicrobials this admission: Vancomycin 10/19 >>  Zosyn 10/19 >>   Dose adjustments this admission: n/a  Microbiology results: 10/19 BCx:    Thank you for allowing pharmacy to be a part of this patient's care.  Janes Colegrove D. Rovena Hearld, PharmD, BCPS Clinical Pharmacist Pager: [phone] Clinical Phone for 11/16/2016 until 3:30pm: [phone] If after 3:30pm, please call main pharmacy at [phone] 11/16/2016 10:01 AM

## 2016-11-16 NOTE — ED Provider Notes (Signed)
[facility] EMERGENCY DEPARTMENT Provider Note   CSN: [id number] Arrival date & time: 11/16/16  4098     History   Chief Complaint Chief Complaint  Patient presents with  . A Fib w/RVR   Level V caveat due to dementia HPI Casey [name] is a 76 y.o. female he presents the emergency department from skilled nursing facility with atrial fibrillation. She is a history of CVA, advanced dementia, chronic kidney disease, seizure disorder, CHF, type 2 diabetes,  Dysphagia, PEG tube feeding dependent. She was discharged from the hospital yesterday after being treated for aspiration pneumonia. Patient arrives by EMS at baseline mental status with HR >140 in AFib With RVR.  She is found to have a rectal temperature of 100.7. Patient also noted to have multiple pressure ulcers. She was discharged yesterday with instructions for frequent suctioning of the airway to prevent aspiration and palliative care consult. Review of the EMR shows that side has declined hospice intervention. The patient is full code.  HPI  Past Medical History:  Diagnosis Date  . Abdominal or pelvic swelling, mass, or lump, left upper quadrant   . CHF (congestive heart failure) ([place])   . Chronic atrial fibrillation ([place])   . Chronic kidney disease   . Congestive heart failure, unspecified   . Coronary atherosclerosis of unspecified type of vessel, native or graft   . Diabetes mellitus   . Edema   . GERD (gastroesophageal reflux disease)   . Gout, unspecified   . Hypercholesterolemia   . Hypertension   . 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  . Peripheral vascular disease, unspecified ([place])   . Seizures ([place])   . Sinus of Valsalva aneurysm    a. By 2D echo 05/2011.  . Stroke ([place])   . Unspecified hereditary and idiopathic peripheral neuropathy     . Unspecified urinary incontinence   . Vascular dementia, uncomplicated     Patient Active Problem List   Diagnosis Date Noted  . Pressure injury of skin 11/07/2016  . Deep tissue injury 11/05/2016  . Malnutrition of moderate degree 11/05/2016  . Sepsis ([place]) 11/04/2016  . Hypercalcemia 11/04/2016  . Aspiration pneumonia ([place]) 11/04/2016  . Acute metabolic encephalopathy 11/91/4782  . Elevated troponin 11/04/2016  . Atrial fibrillation with RVR ([place]) 11/04/2016  . Altered mental status   . Lactic acidosis   . Acute renal failure superimposed on stage 3 chronic kidney disease ([place]) 10/05/2016  . Thrombocytopenia ([place]) 10/05/2016  . Esophageal dysphagia 10/05/2016  . Candida UTI 10/05/2016  . Anemia due to gastrointestinal blood loss 10/05/2016  . Seizure disorder ([place]) 10/05/2016  . Hypokalemia 10/05/2016  . Hypomagnesemia 10/05/2016  . Hypernatremia 10/05/2016  . Severe protein-calorie malnutrition ([place]) 10/05/2016  . Failure to thrive in adult 10/05/2016  . HCAP (healthcare-associated pneumonia)   . Encounter for palliative care   . Goals of care, counseling/discussion   . Acute respiratory failure with hypoxia ([place])   . Acute on chronic systolic CHF (congestive heart failure), NYHA class 4 ([place])   . DCM (dilated cardiomyopathy) ([place])   . Pneumonia due to gram-positive bacteria 09/14/2016  . Hypotension 09/10/2016  . Chronic kidney disease (CKD), stage III (moderate) ([place]) 09/10/2016  . Acute generalized abdominal pain 09/07/2016  . Acute chest pain 09/07/2016  . Atrial fibrillation with rapid ventricular response ([place]) 09/07/2016  .  Wound discharge 09/07/2016  . Anemia 09/07/2016  . Femoral-femoral bypass graft thrombosis, left ([place]) 09/03/2016  . Pseudoaneurysm of femoral artery ([place]) 08/30/2016  . Vascular dementia without behavioral disturbance 08/29/2016  . History of CVA with residual deficit 08/29/2016  . Type 2 diabetes mellitus with diabetic neuropathy,  without long-term current use of insulin ([place]) 08/29/2016  . Depression 04/24/2016  . Loss of weight 04/24/2016  . Hypertensive heart disease with congestive heart failure ([place]) 12/30/2014  . Dyslipidemia associated with type 2 diabetes mellitus ([place]) 12/30/2014  . CKD stage 3 due to type 2 diabetes mellitus ([place]) 12/06/2014  . 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 Chronic systolic congestive heart failure ([place]) 02/09/2013  . Solitary pulmonary nodule 02/09/2013  . Seizures ([place])   . PVD (peripheral vascular disease) ([place]) 09/30/2012  . Gout of multiple sites 05/06/2012  . Dementia without behavioral disturbance 05/06/2012  . Atherosclerosis of native artery of extremity with intermittent claudication ([place]) 02/20/2012  . AKI (acute kidney injury) ([place]) 06/18/2011  . Type 2 diabetes mellitus with diabetic neuropathy ([place]) 12/29/2010  . Atrial fibrillation ([place])   . Late effects of CVA (cerebrovascular accident)     Past Surgical History:  Procedure Laterality Date  . AORTOGRAM  09/01/2008   Abd w/ bilateral lower extremity runoff arteriography  . APPLICATION OF WOUND VAC Left 08/30/2016   Procedure: APPLICATION OF WOUND VAC LEFT GROIN;  Surgeon: Serafina Mitchell, MD;  Location: [facility];  Service: Vascular;  Laterality: Left;  . ENDARTERECTOMY FEMORAL Left 08/30/2016   Procedure: LEFT COMMON FEMORAL ARTERY ENDARTERECTOMY;  Surgeon: Serafina Mitchell, MD;  Location: [facility];  Service: Vascular;  Laterality: Left;  . FALSE ANEURYSM REPAIR  12/06/2011   Procedure: REPAIR FALSE ANEURYSM;  Surgeon: Angelia Mould, MD;  Location: [facility] OR;  Service: Vascular;  Laterality: Left;  Repair of left femoral Artery pseudoaneurysm  . FALSE ANEURYSM REPAIR Left 08/30/2016   Procedure: REPAIR PSEUDOANEURYSM LEFT GROIN;  Surgeon: Serafina Mitchell, MD;  Location: [facility];  Service: Vascular;  Laterality: Left;  . FEMORAL ARTERY EXPLORATION  12/06/2011   Procedure: FEMORAL ARTERY EXPLORATION;  Surgeon: Angelia Mould,  MD;  Location[facility];  Service: Vascular;  Laterality: N/A;  Exploration of large pseudoaneurysm left side of fem-fem bypass graft   . femoral to femoral bypass graft     . FEMORAL-FEMORAL BYPASS GRAFT  12/06/2011   Procedure: BYPASS GRAFT FEMORAL-FEMORAL ARTERY;  Surgeon: Angelia Mould, MD;  Location: [facility] OR;  Service: Vascular;  Laterality: Bilateral;  Revision of left to right Femoral-Femoral bypass graft  . FEMORAL-FEMORAL BYPASS GRAFT Left 08/30/2016   Procedure: REDO RIGHT TO LEFT FEMORAL-FEMORAL ARTERY BYPASS GRAFT;  Surgeon: Serafina Mitchell, MD;  Location: [facility];  Service: Vascular;  Laterality: Left;  . FEMORAL-POPLITEAL BYPASS GRAFT Left 05/2002   lower extremity femoral to below knee w/ non-versed greater saphenous vein  . FEMORAL-POPLITEAL BYPASS GRAFT Left 11/2002  . FEMORAL-POPLITEAL BYPASS GRAFT Left 08/30/2016   Procedure: REVISION OF LEFT FEMORAL-POPLITEAL ARTERY BYPASS GRAFT;  Surgeon: Serafina Mitchell, MD;  Location: [facility] OR;  Service: Vascular;  Laterality: Left;  . IR GASTROSTOMY TUBE MOD SED  10/18/2016  . THROMBECTOMY FEMORAL ARTERY Left 08/30/2016   Procedure: THROMBECTOMY OF FEMORAL-FEMORAL ARTERY BYPASS GRAFT AND LEFT FEMORAL TO LEFT POPLITEAL BYPASS GRAFT;  Surgeon: Serafina Mitchell, MD;  Location: [facility];  Service: Vascular;  Laterality: Left;  . TUBAL LIGATION Bilateral     OB History    No  data available       Home Medications    Prior to Admission medications   Medication Sig Start Date End Date Taking? Authorizing Provider  allopurinol (ZYLOPRIM) 100 MG tablet Place 100 mg into feeding tube daily.     [provider]  Amantadine HCl 100 MG tablet Place 100 mg into feeding tube 2 (two) times daily.    [provider]  Amino Acids-Protein Hydrolys (FEEDING SUPPLEMENT, PRO-STAT SUGAR FREE 64,) LIQD Place 30 mLs into feeding tube 2 (two) times daily.    [provider]  atorvastatin (LIPITOR) 10 MG tablet Place 10 mg into feeding tube  at bedtime.     [provider]  donepezil (ARICEPT) 10 MG tablet Place 10 mg into feeding tube at bedtime.    [provider]  ferrous sulfate 325 (65 FE) MG tablet Give 1 tablet via G-Tube tow times daily    [provider]  insulin glargine (LANTUS) 100 unit/mL SOPN Inject 7 Units into the skin at bedtime.    [provider]  insulin lispro (HUMALOG) 100 UNIT/ML injection Inject 5 Units into the skin every 6 (six) hours.    [provider]  levETIRAcetam (KEPPRA) 250 MG tablet Give 1 tablet via G-Tube two times daily    [provider]  metoprolol tartrate (LOPRESSOR) 100 MG tablet Place 1 tablet (100 mg total) into feeding tube 2 (two) times daily. 11/15/16   Doreatha Lew, MD  mirtazapine (REMERON) 7.5 MG tablet Place 7.5 mg into feeding tube at bedtime.     [provider]  Multiple Vitamins-Minerals (THERA-M) TABS 1 tablet by PEG Tube route daily.    [provider]  pantoprazole (PROTONIX) 40 MG tablet Give 1 tablet via G-Tube tow times a day    [provider]  Water For Irrigation, Sterile (FREE WATER) SOLN Place 100 mLs into feeding tube every 8 (eight) hours. 11/15/16   Doreatha Lew, MD    Family History Family History  Problem Relation Age of Onset  . Cancer Mother        Cervical  . Stroke Mother   . Diabetes Mother   . Stroke Father   . Heart disease Father     Social History Social History  Substance Use Topics  . Smoking status: Former Smoker    Types: Cigarettes    Quit date: 01/29/2001  . Smokeless tobacco: Never Used  . Alcohol use No     Allergies   Penicillins   Review of Systems Review of Systems  Unable to perform ROS: Patient nonverbal  Advanced dementia   Physical Exam Updated Vital Signs BP 122/62 (BP Location: Left Arm)   Pulse (!) 129   Temp (!) 100.7 F (38.2 C) (Rectal)   Resp (!) 24   SpO2 97%   Physical Exam  Constitutional: She has a  sickly appearance. She appears ill.  Protein malnourished  HENT:  Head: Normocephalic and atraumatic.  Eyes: No scleral icterus.  Rightward gaze preference  Neck: JVD present.  Cardiovascular: An irregularly irregular rhythm present. Tachycardia present.   Pulmonary/Chest: Tachypnea noted. She has no decreased breath sounds. She has no wheezes. She has no rhonchi. She has no rales.  Abdominal: Soft. There is generalized tenderness.    Musculoskeletal:  Stage II ulcer on the right lower extremity, bilateral heels with signs of underlying developing pressure ulcers. Stage II decubitus sacral ulcer and stage II ulceration of the right thigh on the posterior side.  Both of these had dressings that were soiled with stool on my examination.  Neurological: She is alert.  Skin: Skin is warm.  Nursing note and vitals reviewed.    ED Treatments / Results  Labs (all labs ordered are listed, but only abnormal results are displayed) Labs Reviewed  CULTURE, BLOOD (ROUTINE X 2)  CULTURE, BLOOD (ROUTINE X 2)  COMPREHENSIVE METABOLIC PANEL  CBC WITH DIFFERENTIAL/PLATELET  URINALYSIS, ROUTINE W REFLEX MICROSCOPIC  MAGNESIUM  I-STAT CG4 LACTIC ACID, ED  I-STAT TROPONIN, ED    EKG  EKG Interpretation None       Radiology Dg Chest Port 1 View  Result Date: 11/15/2016 CLINICAL DATA:  Encounter for shortness of breath . EXAM: PORTABLE CHEST 1 VIEW COMPARISON:  11/11/2016 . FINDINGS: Cardiomegaly with pulmonary vascular prominence and bilateral interstitial prominence bilateral pleural effusions. Findings consistent congestive heart failure. Findings have progressed slightly from prior exam. IMPRESSION: Congestive heart failure bilateral from interstitial edema bilateral pleural effusions. Slight progression from prior exam. Electronically Signed   By: Marcello Moores  Register   On: 11/15/2016 11:32    Procedures Procedures (including critical care time)  Medications Ordered in ED Medications    levofloxacin (LEVAQUIN) IVPB 750 mg (not administered)  aztreonam (AZACTAM) 2 g in dextrose 5 % 50 mL IVPB (not administered)  vancomycin (VANCOCIN) IVPB 1000 mg/200 mL premix (not administered)  diltiazem (CARDIZEM) 1 mg/mL load via infusion 10 mg (not administered)    And  diltiazem (CARDIZEM) 100 mg in dextrose 5 % 100 mL (1 mg/mL) infusion (not administered)     Initial Impression / Assessment and Plan / ED Course  I have reviewed the triage vital signs and the nursing notes.  Pertinent labs & imaging results that were available during my care of the patient were reviewed by me and considered in my medical decision making (see chart for details).  Clinical Course as of Nov 17 1314  Fri Nov 16, 2016  1052 Noted to be elevated at last visit. Likely due to strain from RVR. Troponin i, poc: (!!) 0.37 [AH]  1052 No elevated WBC WBC: 9.2 [AH]  1052 Lactic acid is only mildly elevated Lactic Acid, Venous: (!!) 2.08 [AH]    Clinical Course User Index [AH] Margarita Mail, PA-C    Patient with A. Fib and RVR. Her lactic acid was elevated to 2.08 however it has resolved with minimal fluid resuscitation. She is receiving IV antibiotics at this time and had a 10 mg loading dose of Cardizem prior to giving her antibiotics, however she continues to have rapid ventricular leg and I have been unable to start a diltiazem drip secondary to poor vascular access and a single IV line. The patient continues to be somewhat thick. She has not had any significant decline in here in the emergency department. Review of her x-ray shows a stable right lower lobe pneumonia. I spoken with Dr. Allyson Sabal who will admit the patient.   Final Clinical Impressions(s) / ED Diagnoses   Final diagnoses:  Atrial fibrillation with RVR [facility])    New Prescriptions New Prescriptions   No medications on file     Margarita Mail, PA-C 11/16/16 1318    Lajean Saver, MD 11/16/16 1525

## 2016-11-16 NOTE — Consult Note (Addendum)
[facility] Nurse wound consult note Consult requested for multiple sites.  Pt is familiar to [facility] from recent admission, refer to progress note on 10/8.  Wounds to lower extremities are basically unchanged. Wound type: Deep tissue injuries to bilat heels: Left heel 3X5cm, Right heel 5X7cm, Right inner foot .5X.5cm, Right anterior foot 1X1cm, Left anterior foot 2X1cm Pressure Injury POA: Yes Wound bed: All darker-colored intact skin Right outer leg with full thickness wound; 4X1X.3cm, 80% red and moist, 20% black eschar, no odor, mod amt tan drainage Right arm with full thickness wound; 2X1X.2cm, 80% red and moist, 20% black eschar, no odor, mod amt tan drainage Bilat buttocks/sacrum with red patchy areas of partial thickness skin loss; appearance consistent with moisture associated skin damage. Affected area is approx 5X5cm. Pt is frequently incontinent of loose stools and it is difficult to keep the affected area from becoming soiled.  Dressing procedure/placement/frequency: Float heels to reduce pressure and protect from further injury.  Moist gauze dressing to right arm and leg to assist with removal of nonviable tissue and promote healing. Foam dressing to protect buttocks and attempt to avoid constant soiling.  Discussed plan of care with family member at the bedside. . Please re-consult if further assistance is needed.  Thank-you,  Julien Girt MSN, [place], [place], [place], [place]

## 2016-11-16 NOTE — ED Notes (Addendum)
Patient extremely difficult stick. EMS unable. IV team consult ordered. PA aware.

## 2016-11-16 NOTE — ED Notes (Signed)
IV team staff at bedside at this time.  

## 2016-11-16 NOTE — ED Notes (Signed)
Wound care nurse at bedside at this time.

## 2016-11-16 NOTE — Progress Notes (Signed)
Patient arrived to 4e13 from ED. Cardizem drip was not infusing upon arrival to unit, ED RN unsure of when it got turned off. HR 110-125's at present. BP 116/72. Will continue to infuse cardizem at 5mg /hr for the time being since it was just restarted. Will monitor.  Joellen Jersey, RN

## 2016-11-16 NOTE — Progress Notes (Signed)
Patient arrived to unit from ED. Minimally responsive. Vitals documented in epic. Wounds previously assessed and documented by WOC while in the ED. Rufina Falco, RN at bedside when patient arrived for second skin assessment. CCMD notified of patient's arrival. Family at bedside.   Joellen Jersey, RN

## 2016-11-17 LAB — COMPREHENSIVE METABOLIC PANEL
ALBUMIN: 2.5 g/dL — AB (ref 3.5–5.0)
ALK PHOS: 79 U/L (ref 38–126)
ALT: 14 U/L (ref 14–54)
AST: 25 U/L (ref 15–41)
Anion gap: 10 (ref 5–15)
BUN: 61 mg/dL — AB (ref 6–20)
CALCIUM: 10.1 mg/dL (ref 8.9–10.3)
CHLORIDE: 109 mmol/L (ref 101–111)
CO2: 33 mmol/L — AB (ref 22–32)
CREATININE: 1.68 mg/dL — AB (ref 0.44–1.00)
GFR calc Af Amer: 33 mL/min — ABNORMAL LOW (ref >60)
GFR calc non Af Amer: 28 mL/min — ABNORMAL LOW (ref >60)
GLUCOSE: 200 mg/dL — AB (ref 65–99)
Potassium: 3.9 mmol/L (ref 3.5–5.1)
SODIUM: 152 mmol/L — AB (ref 135–145)
Total Bilirubin: 0.8 mg/dL (ref 0.3–1.2)
Total Protein: 6.7 g/dL (ref 6.5–8.1)

## 2016-11-17 LAB — CBC
HCT: 32 % — ABNORMAL LOW (ref 36.0–46.0)
Hemoglobin: 8.8 g/dL — ABNORMAL LOW (ref 12.0–15.0)
MCH: 23.6 pg — AB (ref 26.0–34.0)
MCHC: 27.5 g/dL — ABNORMAL LOW (ref 30.0–36.0)
MCV: 85.8 fL (ref 78.0–100.0)
PLATELETS: 212 10*3/uL (ref 150–400)
RBC: 3.73 MIL/uL — AB (ref 3.87–5.11)
RDW: 17.8 % — ABNORMAL HIGH (ref 11.5–15.5)
WBC: 8.8 10*3/uL (ref 4.0–10.5)

## 2016-11-17 LAB — GLUCOSE, CAPILLARY: Glucose-Capillary: 198 mg/dL — ABNORMAL HIGH (ref 65–99)

## 2016-11-17 MED ORDER — SODIUM CHLORIDE 0.9 % IV SOLN
250.0000 mg | Freq: Two times a day (BID) | INTRAVENOUS | Status: DC
  Administered 2016-11-17 – 2016-11-19 (×5): 250 mg via INTRAVENOUS
  Filled 2016-11-17 (×5): qty 2.5

## 2016-11-17 MED ORDER — DEXTROSE-NACL 5-0.45 % IV SOLN
INTRAVENOUS | Status: DC
  Administered 2016-11-17: 23:00:00 via INTRAVENOUS
  Filled 2016-11-17: qty 1000

## 2016-11-17 MED ORDER — PANTOPRAZOLE SODIUM 40 MG IV SOLR
40.0000 mg | INTRAVENOUS | Status: DC
  Administered 2016-11-17 – 2016-11-19 (×3): 40 mg via INTRAVENOUS
  Filled 2016-11-17 (×3): qty 40

## 2016-11-17 MED ORDER — FUROSEMIDE 10 MG/ML IJ SOLN
20.0000 mg | Freq: Two times a day (BID) | INTRAMUSCULAR | Status: DC
  Administered 2016-11-17 – 2016-11-18 (×3): 20 mg via INTRAVENOUS
  Filled 2016-11-17 (×3): qty 2

## 2016-11-17 MED ORDER — METOPROLOL TARTRATE 5 MG/5ML IV SOLN: 5.0000 mg | Freq: Once | INTRAVENOUS | Status: DC

## 2016-11-17 MED ORDER — METOPROLOL TARTRATE 5 MG/5ML IV SOLN
5.0000 mg | Freq: Once | INTRAVENOUS | Status: AC | PRN
  Administered 2016-11-17: 5 mg via INTRAVENOUS
  Filled 2016-11-17: qty 5

## 2016-11-17 MED ORDER — ORAL CARE MOUTH RINSE
15.0000 mL | Freq: Two times a day (BID) | OROMUCOSAL | Status: DC
  Administered 2016-11-17 – 2016-11-19 (×5): 15 mL via OROMUCOSAL

## 2016-11-17 NOTE — Progress Notes (Signed)
PROGRESS NOTE  Casey [name] FKC:[id number] DOB: 1941/01/28 DOA: 11/16/2016 PCP: Gildardo Cranker, DO  HPI/Recap of past 60 hours: 76 year old female with history of CVA, dementia, CKD, seizure disorders, type 2 diabetes mellitus, chronic A. fib, recurrent aspiration pneumonia recently discharged on 10/18 to SNF after being hospitalized for altered mental status, UTI, aspiration pneumonia and A. fib with RVR.  Patient presents on 10/19 from the SNF with reports of heart rates in the 180s, mild fever, altered mental status.  Of note is patient's completed 7 days of IV levofloxacin for aspiration pneumonia on last admission.  Patient was also treated for A. fib with RVR on last admission.  Patient is presenting with possible sepsis-like picture most likely from recurrent aspiration pneumonia despite PEG tube placements on 9/20  Due to patient's declining medical condition, I had an extensive conversation with patient's daughter and son about patient's declining medical condition and they both agreed to change their mothers CODE STATUS to DNR.  Also the palliative team consult was also placed to discuss comfort care measures and both children are in agreement to have a meeting with the palliative team.  Today, patient still  ill looking, heart rate a little bit more controlled, but still tachypneic.  Overall state of patient with guarded prognosis.  Assessment/Plan: Principal Problem:   Atrial fibrillation with RVR (HCC) Active Problems:   Atrial fibrillation (HCC)   Type 2 diabetes mellitus with diabetic neuropathy (HCC)   AKI (acute kidney injury) ([place])   Dementia without behavioral disturbance   PVD (peripheral vascular disease) (HCC)   Chronic systolic congestive heart failure (HCC)   CKD stage 3 due to type 2 diabetes mellitus (HCC)   Type 2 diabetes mellitus with diabetic neuropathy, without long-term current use of insulin (HCC)   Anemia   Chronic kidney disease (CKD), stage III  (moderate) (HCC)   Acute on chronic systolic CHF (congestive heart failure), NYHA class 4 (HCC)   Hypernatremia   Failure to thrive in adult   Sepsis ([place])   #Severe sepsis Likely secondary to aspiration pneumonia Recently completed 7-day of IV Levaquin UA negative, repeat blood culture pending Pro-calcitonin 0.45 signifying local bacterial infection Lactic acid resolved, from 2.08-1.31 Cxr: R upper lobe opacity, mild pulm congestion Continue IV Zosyn and vancomycin Aspiration precautions, continue medications through PEG tube Patient is currently DNR, awaiting palliative team for comfort measures  #Chronic A. fib with RVR Heart rate currently controlled although still fluctuating Still on Cardizem drip, continue Lopressor, as needed IV metoprolol Patient not on any anticoagulation due to history of GI bleed Patient was on amiodarone which was discontinued due to QTC prolongation  #Acute on chronic hypercapnic resp failure ABG showing ph 7.40, PCO2 63.2, PO2 94 Likely respiratory acidosis + met alkalosis Likely 2/2 to aspiration PNA + ??dehydration  Plan to hydrate pt cautiously as NPO currently Start D5W+ 1/2NS @ 50cc/hr Strict I&O Bipap would be rec, but pt not a good candidate to be placed on bipap  #Chronic systolic CHF Euvolemic EF 20-25% Patient with poor prognosis, not any candidate for invasive procedures  #CKD At baseline Daily BMP  #hypernatremia Likely secondary to poor free water intake and dehydration  #R upper arm brachial vein DVT Recent GI bleed, not a candidate for A/C  # Severe dementia Continue aricept, amantadine   #Pressure ulcer on bilateral heels WOC consult  Goals of care discussed as mentioned above       Code Status: DNR  Family Communication: Spoke extensively  with Son Hoover Browns and daughter Fraser Din, who agreed for code status change to DNR  Disposition Plan: Palliative team to evaluate    Consultants:  Palliative team    Procedures:  None   Antimicrobials: IV Vancomycin +Zosyn  DVT prophylaxis:  SCDs   Objective: Vitals:   11/17/16 1634 11/17/16 1700 11/17/16 1800 11/17/16 1955  BP: 134/75 (!) 131/112 (!) 116/57 129/66  Pulse: 94 60 92 83  Resp: (!) 48 (!) 38 (!) 40 (!) 36  Temp: 99 F (37.2 C)   (!) 100.5 F (38.1 C)  TempSrc: Axillary   Axillary  SpO2: 95% 96%  96%  Weight:      Height:        Intake/Output Summary (Last 24 hours) at 11/17/16 2116 Last data filed at 11/17/16 2058  Gross per 24 hour  Intake           366.83 ml  Output              550 ml  Net          -183.17 ml   Filed Weights   11/16/16 2300 11/17/16 0411  Weight: 67.5 kg (148 lb 13 oz) 61.4 kg (135 lb 5.8 oz)    Exam:   General: ill appearing, unable to verbalize   Cardiovascular: Irregularly irregular, no murmurs  Respiratory: Tachypneic, chest clear bilaterally   Abdomen: soft, non-tender, PEG tube in place  Musculoskeletal: Ulcer noted on bilateral heel, no pedal edema  Skin: Multiple pressure ulcers  Psychiatry: Unable to determine mood    Data Reviewed: CBC:  Recent Labs Lab 11/11/16 0625  11/14/16 0658 11/15/16 0457 11/16/16 1010 11/16/16 1555 11/17/16 0245  WBC 9.1  < > 9.6 8.9 9.2 8.8 8.8  NEUTROABS 6.9  --   --   --  7.2  --   --   HGB 9.0*  < > 9.0* 8.7* 9.0* 9.0* 8.8*  HCT 31.7*  < > 32.7* 31.2* 32.7* 32.4* 32.0*  MCV 83.9  < > 86.5 86.4 85.8 86.4 85.8  PLT 245  < > 224 227 212 214 212  < > = values in this interval not displayed. Basic Metabolic Panel:  Recent Labs Lab 11/11/16 0625 11/12/16 0434 11/12/16 1753 11/13/16 0519 11/14/16 0658 11/15/16 0457 11/16/16 1010 11/16/16 1555 11/17/16 0245  NA 144 144 143 145 150* 149* 154*  --  152*  K 3.4* 3.5 3.4* 3.7 3.5 3.5 3.8  --  3.9  CL 105 104 104 104 108 109 111  --  109  CO2 '30 31 31 ' 32 33* 30 35*  --  33*  GLUCOSE 156* 205* 232* 201* 206* 190* 190*  --  200*  BUN 51* 62* 60* 61* 61* 65* 65*  --  61*   CREATININE 1.72* 2.03* 1.89* 1.87* 1.67* 1.70* 1.65* 1.66* 1.68*  CALCIUM 10.4* 10.1 10.0 10.1 10.3 10.3 10.7*  --  10.1  MG 2.1 2.0 2.0  --  1.9  --  2.3  --   --    GFR: Estimated Creatinine Clearance: 23.3 mL/min (A) (by C-G formula based on SCr of 1.68 mg/dL (H)). Liver Function Tests:  Recent Labs Lab 11/11/16 0625 11/16/16 1010 11/17/16 0245  AST '19 31 25  ' ALT '16 18 14  ' ALKPHOS 110 101 79  BILITOT 0.7 0.6 0.8  PROT 7.4 7.3 6.7  ALBUMIN 2.5* 2.7* 2.5*   No results for input(s): LIPASE, AMYLASE in the last 168 hours. No results for input(s): AMMONIA in  the last 168 hours. Coagulation Profile:  Recent Labs Lab 11/16/16 1555  INR 1.30   Cardiac Enzymes: No results for input(s): CKTOTAL, CKMB, CKMBINDEX, TROPONINI in the last 168 hours. BNP (last 3 results) No results for input(s): PROBNP in the last 8760 hours. HbA1C: No results for input(s): HGBA1C in the last 72 hours. CBG:  Recent Labs Lab 11/15/16 1224 11/15/16 1244 11/15/16 1652 11/16/16 2102 11/17/16 1631  GLUCAP 178* 191* 195* 188* 198*   Lipid Profile: No results for input(s): CHOL, HDL, LDLCALC, TRIG, CHOLHDL, LDLDIRECT in the last 72 hours. Thyroid Function Tests:  Recent Labs  11/16/16 1555  TSH 4.900*   Anemia Panel: No results for input(s): VITAMINB12, FOLATE, FERRITIN, TIBC, IRON, RETICCTPCT in the last 72 hours. Urine analysis:    Component Value Date/Time   COLORURINE YELLOW 11/16/2016 1211   APPEARANCEUR HAZY (A) 11/16/2016 1211   LABSPEC 1.015 11/16/2016 1211   PHURINE 5.0 11/16/2016 1211   GLUCOSEU NEGATIVE 11/16/2016 1211   HGBUR NEGATIVE 11/16/2016 1211   BILIRUBINUR NEGATIVE 11/16/2016 1211   KETONESUR NEGATIVE 11/16/2016 1211   PROTEINUR 30 (A) 11/16/2016 1211   UROBILINOGEN 0.2 02/02/2013 2008   NITRITE NEGATIVE 11/16/2016 1211   LEUKOCYTESUR TRACE (A) 11/16/2016 1211   Sepsis Labs: '@LABRCNTIP' (procalcitonin:4,lacticidven:4)  ) Recent Results (from the past 240  hour(s))  Blood Culture (routine x 2)     Status: None (Preliminary result)   Collection Time: 11/16/16 10:10 AM  Result Value Ref Range Status   Specimen Description BLOOD LEFT FOREARM  Final   Special Requests   Final    BOTTLES DRAWN AEROBIC AND ANAEROBIC Blood Culture adequate volume   Culture NO GROWTH 1 DAY  Final   Report Status PENDING  Incomplete  Blood Culture (routine x 2)     Status: None (Preliminary result)   Collection Time: 11/16/16 11:15 AM  Result Value Ref Range Status   Specimen Description BLOOD LEFT HAND  Final   Special Requests   Final    BOTTLES DRAWN AEROBIC AND ANAEROBIC Blood Culture adequate volume   Culture NO GROWTH 1 DAY  Final   Report Status PENDING  Incomplete      Studies: No results found.  Scheduled Meds: . amantadine  100 mg Per Tube BID  . atorvastatin  10 mg Per Tube QHS  . donepezil  10 mg Per Tube QHS  . furosemide  20 mg Intravenous BID  . heparin  5,000 Units Subcutaneous Q8H  . insulin aspart  0-9 Units Subcutaneous TID WC  . insulin glargine  7 Units Subcutaneous QHS  . mouth rinse  15 mL Mouth Rinse BID  . metoprolol tartrate  25 mg Per Tube BID  . mirtazapine  7.5 mg Per Tube QHS  . pantoprazole (PROTONIX) IV  40 mg Intravenous Q24H    Continuous Infusions: . diltiazem (CARDIZEM) infusion 15 mg/hr (11/17/16 1535)  . levETIRAcetam Stopped (11/17/16 1234)  . piperacillin-tazobactam (ZOSYN)  IV Stopped (11/17/16 1843)  . vancomycin Stopped (11/17/16 1330)     LOS: 1 day     Alma Friendly, MD [facility] Pager [phone]  If 7PM-7AM, please contact night-coverage [email or web address] Password [email or web address] 11/17/2016, 9:16 PM

## 2016-11-18 DIAGNOSIS — I5022 Chronic systolic (congestive) heart failure: Secondary | ICD-10-CM

## 2016-11-18 DIAGNOSIS — E870 Hyperosmolality and hypernatremia: Secondary | ICD-10-CM

## 2016-11-18 DIAGNOSIS — I5023 Acute on chronic systolic (congestive) heart failure: Secondary | ICD-10-CM

## 2016-11-18 DIAGNOSIS — A419 Sepsis, unspecified organism: Principal | ICD-10-CM

## 2016-11-18 DIAGNOSIS — I4891 Unspecified atrial fibrillation: Secondary | ICD-10-CM

## 2016-11-18 DIAGNOSIS — G301 Alzheimer's disease with late onset: Secondary | ICD-10-CM

## 2016-11-18 DIAGNOSIS — N179 Acute kidney failure, unspecified: Secondary | ICD-10-CM

## 2016-11-18 DIAGNOSIS — F0280 Dementia in other diseases classified elsewhere without behavioral disturbance: Secondary | ICD-10-CM

## 2016-11-18 DIAGNOSIS — E1140 Type 2 diabetes mellitus with diabetic neuropathy, unspecified: Secondary | ICD-10-CM

## 2016-11-18 DIAGNOSIS — N183 Chronic kidney disease, stage 3 (moderate): Secondary | ICD-10-CM

## 2016-11-18 LAB — CBC WITH DIFFERENTIAL/PLATELET
BASOS ABS: 0.1 10*3/uL (ref 0.0–0.1)
BASOS PCT: 1 %
EOS ABS: 0.2 10*3/uL (ref 0.0–0.7)
Eosinophils Relative: 2 %
HCT: 33.2 % — ABNORMAL LOW (ref 36.0–46.0)
HEMOGLOBIN: 9.3 g/dL — AB (ref 12.0–15.0)
LYMPHS ABS: 1.4 10*3/uL (ref 0.7–4.0)
Lymphocytes Relative: 15 %
MCH: 23.8 pg — AB (ref 26.0–34.0)
MCHC: 28 g/dL — ABNORMAL LOW (ref 30.0–36.0)
MCV: 84.9 fL (ref 78.0–100.0)
Monocytes Absolute: 0.7 10*3/uL (ref 0.1–1.0)
Monocytes Relative: 7 %
NEUTROS PCT: 75 %
Neutro Abs: 7.4 10*3/uL (ref 1.7–7.7)
PLATELETS: 228 10*3/uL (ref 150–400)
RBC: 3.91 MIL/uL (ref 3.87–5.11)
RDW: 17.9 % — ABNORMAL HIGH (ref 11.5–15.5)
WBC: 9.8 10*3/uL (ref 4.0–10.5)

## 2016-11-18 LAB — BLOOD GAS, ARTERIAL
Acid-Base Excess: 8.3 mmol/L — ABNORMAL HIGH (ref 0.0–2.0)
BICARBONATE: 32.7 mmol/L — AB (ref 20.0–28.0)
DRAWN BY: 283401
O2 Content: 1.5 L/min
O2 Saturation: 88.4 %
PH ART: 7.438 (ref 7.350–7.450)
PO2 ART: 56.2 mmHg — AB (ref 83.0–108.0)
Patient temperature: 98.6
pCO2 arterial: 49.2 mmHg — ABNORMAL HIGH (ref 32.0–48.0)

## 2016-11-18 LAB — BASIC METABOLIC PANEL
ANION GAP: 10 (ref 5–15)
BUN: 64 mg/dL — ABNORMAL HIGH (ref 6–20)
CHLORIDE: 112 mmol/L — AB (ref 101–111)
CO2: 31 mmol/L (ref 22–32)
Calcium: 10.2 mg/dL (ref 8.9–10.3)
Creatinine, Ser: 2.32 mg/dL — ABNORMAL HIGH (ref 0.44–1.00)
GFR, EST AFRICAN AMERICAN: 22 mL/min — AB (ref >60)
GFR, EST NON AFRICAN AMERICAN: 19 mL/min — AB (ref >60)
GLUCOSE: 234 mg/dL — AB (ref 65–99)
POTASSIUM: 3.8 mmol/L (ref 3.5–5.1)
Sodium: 153 mmol/L — ABNORMAL HIGH (ref 135–145)

## 2016-11-18 LAB — GLUCOSE, CAPILLARY
Glucose-Capillary: 214 mg/dL — ABNORMAL HIGH (ref 65–99)
Glucose-Capillary: 182 mg/dL — ABNORMAL HIGH (ref 65–99)

## 2016-11-18 LAB — BRAIN NATRIURETIC PEPTIDE: B NATRIURETIC PEPTIDE 5: 1213.4 pg/mL — AB (ref 0.0–100.0)

## 2016-11-18 MED ORDER — GLYCOPYRROLATE 0.2 MG/ML IJ SOLN
0.2000 mg | INTRAMUSCULAR | Status: DC | PRN
  Filled 2016-11-18: qty 1

## 2016-11-18 MED ORDER — HALOPERIDOL LACTATE 2 MG/ML PO CONC
0.5000 mg | ORAL | Status: DC | PRN
  Filled 2016-11-18: qty 0.3

## 2016-11-18 MED ORDER — POLYVINYL ALCOHOL 1.4 % OP SOLN
1.0000 [drp] | Freq: Four times a day (QID) | OPHTHALMIC | Status: DC | PRN
  Filled 2016-11-18: qty 15

## 2016-11-18 MED ORDER — BIOTENE DRY MOUTH MT LIQD: 15.0000 mL | OROMUCOSAL | Status: DC | PRN

## 2016-11-18 MED ORDER — HALOPERIDOL LACTATE 5 MG/ML IJ SOLN: 0.5000 mg | INTRAMUSCULAR | Status: DC | PRN

## 2016-11-18 MED ORDER — HALOPERIDOL 0.5 MG PO TABS
0.5000 mg | ORAL_TABLET | ORAL | Status: DC | PRN
  Filled 2016-11-18: qty 1

## 2016-11-18 MED ORDER — MORPHINE SULFATE (CONCENTRATE) 10 MG/0.5ML PO SOLN
5.0000 mg | ORAL | Status: DC | PRN
  Administered 2016-11-19 (×2): 5 mg via SUBLINGUAL
  Filled 2016-11-18 (×2): qty 0.5

## 2016-11-18 MED ORDER — PIPERACILLIN-TAZOBACTAM IN DEX 2-0.25 GM/50ML IV SOLN
2.2500 g | Freq: Three times a day (TID) | INTRAVENOUS | Status: DC
  Administered 2016-11-18: 2.25 g via INTRAVENOUS
  Filled 2016-11-18 (×2): qty 50

## 2016-11-18 MED ORDER — METOPROLOL TARTRATE 50 MG PO TABS
50.0000 mg | ORAL_TABLET | Freq: Two times a day (BID) | ORAL | Status: DC
  Administered 2016-11-18 – 2016-11-19 (×3): 50 mg
  Filled 2016-11-18 (×3): qty 1

## 2016-11-18 MED ORDER — VANCOMYCIN HCL IN DEXTROSE 750-5 MG/150ML-% IV SOLN: 750.0000 mg | INTRAVENOUS | Status: DC

## 2016-11-18 MED ORDER — GLYCOPYRROLATE 1 MG PO TABS
1.0000 mg | ORAL_TABLET | ORAL | Status: DC | PRN
  Filled 2016-11-18: qty 1

## 2016-11-18 NOTE — Progress Notes (Signed)
PROGRESS NOTE  Casey [name] DTO:[id number] DOB: 06/18/1940 DOA: 11/16/2016 PCP: Gildardo Cranker, DO  HPI/Recap of past 20 hours: 76 year old female with a history of CVA, dementia, CKD, seizure disorders, type 2 diabetes mellitus, chronic A. fib, recurrent aspiration pneumonia recently discharged on 10/18 to SNF after being hospitalized for altered mental status, UTI, aspiration pneumonia and A. fib with RVR.  Patient presented again on 10/19 with similar complaints.  Patient had completed 7 days of IV levo on last admission.  Patient is presenting with possible sepsis-like picture likely from recurrent aspiration pneumonia despite PEG tube placement on 9/20  Today, patient still ill looking, heart rate controlled, still slightly tachypneic.  Review of systems can be done as patient has a history of dementia/stroke and is not able to communicate effectively.  Due to patient's declining medical condition, patient is DNR, status changed on 11/17/16.  Patient's son and daughter in agreement with comfort care measures.  Consult to palliative team.  Assessment/Plan: Principal Problem:   Atrial fibrillation with RVR (HCC) Active Problems:   Atrial fibrillation (HCC)   Type 2 diabetes mellitus with diabetic neuropathy (HCC)   AKI (acute kidney injury) ([place])   Dementia without behavioral disturbance   PVD (peripheral vascular disease) (HCC)   Chronic systolic congestive heart failure ([place])   CKD stage 3 due to type 2 diabetes mellitus ([place])   Type 2 diabetes mellitus with diabetic neuropathy, without long-term current use of insulin (HCC)   Anemia   Chronic kidney disease (CKD), stage III (moderate) (HCC)   Acute on chronic systolic CHF (congestive heart failure), NYHA class 4 (HCC)   Hypernatremia   Failure to thrive in adult   Sepsis ([place])  #Severe sepsis Likely secondary to aspiration pneumonia UA negative, repeat blood culture pending Pro-calcitonin 0.45 signifying local bacterial  infection Lactic acid resolved Chest x-ray showed right upper lobe opacity, mild pulmonary congestion Continue IV Zosyn and vancomycin Aspiration precautions, continue medications to PEG tube Patient is currently DNR, awaiting palliative team for comfort measures  #Chronic A. fib with RVR Heart rate controlled Will titrate down Cardizem drip and continue Lopressor, as needed IV metoprolol Will increase Lopressor to 50 twice daily Patient not on any anticoagulation due to history of GI bleed Patient was on amiodarone which was discontinued due to QTC prolongation  #Acute on chronic hypercapnic respiratory failure Resolving, in mild distress Likely secondary to respiratory acidosis plus metabolic alkalosis Status post cautious hydration Oxygen as needed, patient not a good candidate to be placed on BiPAP  #Chronic systolic CHF Euvolemic EF 20-25% Patient with poor prognosis, not a candidate for invasive procedures  #CKD At baseline Daily BMP  #Hypernatremia Likely secondary to poor free water intake and dehydration Status post cautious hydration Difficult to manage as patient has an EF of 20-25%  #Right upper brachial vein DVT Recent GI bleed, not a candidate for anticoagulation  #Severe dementia Continue Aricept, amantadine  #Pressure ulcer on bilateral heels WOC on board     Code Status: DNR, status changed on 11/17/16  Family Communication: None today  Disposition Plan: Palliative team to evaluate   Consultants:  Palliative team  Procedures:  None  Antimicrobials:  IV vancomycin plus Zosyn  DVT prophylaxis: SCDs   Objective: Vitals:   11/18/16 0500 11/18/16 0600 11/18/16 0830 11/18/16 1100  BP: 121/80 133/78 119/72 133/82  Pulse: 90 94 91 95  Resp: (!) 39 (!) 28 (!) 34 (!) 32  Temp:   99.2 F (37.3 C) 99  F (37.2 C)  TempSrc:   Axillary Axillary  SpO2: 97% 100% 97% 99%  Weight:      Height:        Intake/Output Summary (Last 24  hours) at 11/18/16 1333 Last data filed at 11/18/16 0500  Gross per 24 hour  Intake             1172 ml  Output              450 ml  Net              722 ml   Filed Weights   11/16/16 2300 11/17/16 0411  Weight: 67.5 kg (148 lb 13 oz) 61.4 kg (135 lb 5.8 oz)    Exam:   General: Ill-appearing, unable to communicate  Cardiovascular: Irregularly irregular, no murmurs  Respiratory: Tachypneic, chest clear bilaterally  Abdomen: Soft nontender, PEG tube in place  Musculoskeletal: Also noted on bilateral heel, no pedal edema  Skin: Multiple pressure ulcers  Psychiatry: Unable to determine mood   Data Reviewed: CBC:  Recent Labs Lab 11/15/16 0457 11/16/16 1010 11/16/16 1555 11/17/16 0245 11/18/16 0537  WBC 8.9 9.2 8.8 8.8 9.8  NEUTROABS  --  7.2  --   --  7.4  HGB 8.7* 9.0* 9.0* 8.8* 9.3*  HCT 31.2* 32.7* 32.4* 32.0* 33.2*  MCV 86.4 85.8 86.4 85.8 84.9  PLT 227 212 214 212 161   Basic Metabolic Panel:  Recent Labs Lab 11/12/16 0434 11/12/16 1753  11/14/16 0658 11/15/16 0457 11/16/16 1010 11/16/16 1555 11/17/16 0245 11/18/16 0537  NA 144 143  < > 150* 149* 154*  --  152* 153*  K 3.5 3.4*  < > 3.5 3.5 3.8  --  3.9 3.8  CL 104 104  < > 108 109 111  --  109 112*  CO2 31 31  < > 33* 30 35*  --  33* 31  GLUCOSE 205* 232*  < > 206* 190* 190*  --  200* 234*  BUN 62* 60*  < > 61* 65* 65*  --  61* 64*  CREATININE 2.03* 1.89*  < > 1.67* 1.70* 1.65* 1.66* 1.68* 2.32*  CALCIUM 10.1 10.0  < > 10.3 10.3 10.7*  --  10.1 10.2  MG 2.0 2.0  --  1.9  --  2.3  --   --   --   < > = values in this interval not displayed. GFR: Estimated Creatinine Clearance: 16.9 mL/min (A) (by C-G formula based on SCr of 2.32 mg/dL (H)). Liver Function Tests:  Recent Labs Lab 11/16/16 1010 11/17/16 0245  AST 31 25  ALT 18 14  ALKPHOS 101 79  BILITOT 0.6 0.8  PROT 7.3 6.7  ALBUMIN 2.7* 2.5*   No results for input(s): LIPASE, AMYLASE in the last 168 hours. No results for input(s):  AMMONIA in the last 168 hours. Coagulation Profile:  Recent Labs Lab 11/16/16 1555  INR 1.30   Cardiac Enzymes: No results for input(s): CKTOTAL, CKMB, CKMBINDEX, TROPONINI in the last 168 hours. BNP (last 3 results) No results for input(s): PROBNP in the last 8760 hours. HbA1C: No results for input(s): HGBA1C in the last 72 hours. CBG:  Recent Labs Lab 11/15/16 1244 11/15/16 1652 11/16/16 2102 11/17/16 1631 11/18/16 0600  GLUCAP 191* 195* 188* 198* 214*   Lipid Profile: No results for input(s): CHOL, HDL, LDLCALC, TRIG, CHOLHDL, LDLDIRECT in the last 72 hours. Thyroid Function Tests:  Recent Labs  11/16/16 1555  TSH 4.900*  Anemia Panel: No results for input(s): VITAMINB12, FOLATE, FERRITIN, TIBC, IRON, RETICCTPCT in the last 72 hours. Urine analysis:    Component Value Date/Time   COLORURINE YELLOW 11/16/2016 1211   APPEARANCEUR HAZY (A) 11/16/2016 1211   LABSPEC 1.015 11/16/2016 1211   PHURINE 5.0 11/16/2016 1211   GLUCOSEU NEGATIVE 11/16/2016 1211   HGBUR NEGATIVE 11/16/2016 1211   BILIRUBINUR NEGATIVE 11/16/2016 1211   KETONESUR NEGATIVE 11/16/2016 1211   PROTEINUR 30 (A) 11/16/2016 1211   UROBILINOGEN 0.2 02/02/2013 2008   NITRITE NEGATIVE 11/16/2016 1211   LEUKOCYTESUR TRACE (A) 11/16/2016 1211   Sepsis Labs: @LABRCNTIP (procalcitonin:4,lacticidven:4)  ) Recent Results (from the past 240 hour(s))  Blood Culture (routine x 2)     Status: None (Preliminary result)   Collection Time: 11/16/16 10:10 AM  Result Value Ref Range Status   Specimen Description BLOOD LEFT FOREARM  Final   Special Requests   Final    BOTTLES DRAWN AEROBIC AND ANAEROBIC Blood Culture adequate volume   Culture NO GROWTH 1 DAY  Final   Report Status PENDING  Incomplete  Blood Culture (routine x 2)     Status: None (Preliminary result)   Collection Time: 11/16/16 11:15 AM  Result Value Ref Range Status   Specimen Description BLOOD LEFT HAND  Final   Special Requests    Final    BOTTLES DRAWN AEROBIC AND ANAEROBIC Blood Culture adequate volume   Culture NO GROWTH 1 DAY  Final   Report Status PENDING  Incomplete      Studies: No results found.  Scheduled Meds: . amantadine  100 mg Per Tube BID  . atorvastatin  10 mg Per Tube QHS  . donepezil  10 mg Per Tube QHS  . heparin  5,000 Units Subcutaneous Q8H  . insulin aspart  0-9 Units Subcutaneous TID WC  . insulin glargine  7 Units Subcutaneous QHS  . mouth rinse  15 mL Mouth Rinse BID  . metoprolol tartrate  50 mg Per Tube BID  . mirtazapine  7.5 mg Per Tube QHS  . pantoprazole (PROTONIX) IV  40 mg Intravenous Q24H    Continuous Infusions: . diltiazem (CARDIZEM) infusion 10 mg/hr (11/18/16 1327)  . levETIRAcetam Stopped (11/17/16 2234)  . piperacillin-tazobactam (ZOSYN)  IV    . [START ON ] vancomycin       LOS: 2 days     Alma Friendly, MD [facility] Pager [phone]  If 7PM-7AM, please contact night-coverage [email or web address] Password [email or web address] 11/18/2016, 1:33 PM

## 2016-11-18 NOTE — Progress Notes (Signed)
1645--[facility]--[facility] RN Visit for [place] request from Florentina Jenny, [place] and Rhea Pink, [place] for family interest in [facility]. Chart reviewed and spoke with daughter Fraser Din to acknowledge referral. Unfortunately, [facility] is not able to offer a room today. Family and CSW are aware. [facility] liaison will follow up with CSW and family tomorrow if room becomes available.  Please do not hesitate to call with questions.  Thank you, Margaretmary Eddy, RN, [facility] Liaison [phone]  [place] are on [facility]ION.

## 2016-11-18 NOTE — Consult Note (Signed)
Consultation Note Date: 11/18/2016   Patient Name: Casey [name]  DOB: 14-Aug-1940  MRN: [id number]  Age / Sex: 76 y.o., female  PCP: Gildardo Cranker, DO Referring Physician: Alma Friendly, MD  Reason for Consultation: Establishing goals of care  HPI/Patient Profile: 76 y.o. female  with past medical history of CVA s/p PEG, dementia, CHF with an EF of 20-25%, CKD, Seizures, DM, Afib with RVR, and bilateral lower extremity wounds, who was admitted on 11/16/2016 with CHF exacerbation and aspiration pneumonia causing severe sepsis. She remains on a cardizem gtt due to RVR.  She has had 5 hospitalizations in the last 3 months.  She has a PEG in place and is NPO.  The family has met with Palliative Care multiple times in the past.  MD is recommending comfort measures.  Clinical Assessment and Goals of Care:  I have reviewed medical records including EPIC notes, labs and imaging, received report from the bedside RN, assessed the patient and then met at the bedside along with her daughter and Casey to discuss diagnosis prognosis, GOC, EOL wishes, disposition and options.  I introduced Palliative Medicine as specialized medical care for people living with serious illness. It focuses on providing relief from the symptoms and stress of a serious illness. The goal is to improve quality of life for both the patient and the family.  We discussed a brief life review of the patient. We discussed her current illness and what it means in the larger context of her on-going co-morbidities.  Natural disease trajectory and expectations at EOL were discussed.  Specifically Ms.  [name] is significantly disabled due to stroke.  Her heart failure and kidney failure have progress significantly.  Her family realizes she is near end of life and can't be cared for at the skilled nursing facility any longer.    We had significant  discussion around use of the feeding tube and starving to death.  After some conversation the family realized that at end of life dehydration is more comfortable as the body can not process nutrition any longer.  The family was comfortable with [facility] and no further feedings - other than comfort feeds for taste.  Advanced directives, concepts specific to code status, artifical feeding and hydration, and rehospitalization were considered and discussed.  The patient and family realize she is near EOL and want her to be comfortable and well cared for above all else.  Hospice and Palliative Care services outpatient were explained and offered.  The family requests [facility].  Questions and concerns were addressed. The family was encouraged to call with questions or concerns.    Primary Decision Maker:  NEXT OF KIN Casey [name] and Casey [name].    SUMMARY OF RECOMMENDATIONS    Full comfort care - except please continue IVF and rate control medications until discharge to [facility].  Discharge to [facility]) as soon as a bed is available.  Code Status/Advance Care Planning:  DNR/DNI   Symptom Management:   All medications  and interventions not related to comfort will be discontinued.  Normal comfort meds have been ordered PRN (Morphine, haldol, robinul)   Prognosis: days to less than two weeks.  Patient has not had tube feedings during this hospitalization.  She is unable to take nutrition by mouth.  Family is agreeable to no further PEG feeding.  She has very advanced heart failure, dementia, kidney failure.  She has uncontrolled Afib.    Discharge Planning: Hospice facility      Primary Diagnoses: Present on Admission: . Atrial fibrillation ([place]) . Type 2 diabetes mellitus with diabetic neuropathy ([place]) . AKI (acute kidney injury) ([place]) . Dementia without behavioral disturbance . PVD (peripheral vascular disease) ([place]) . Chronic systolic congestive  heart failure ([place]) . CKD stage 3 due to type 2 diabetes mellitus ([place]) . Type 2 diabetes mellitus with diabetic neuropathy, without long-term current use of insulin ([place]) . Anemia . Chronic kidney disease (CKD), stage III (moderate) (HCC) . Acute on chronic systolic CHF (congestive heart failure), NYHA class 4 ([place]) . Hypernatremia . Failure to thrive in adult . Atrial fibrillation with RVR ([place]) . Sepsis ([place])   I have reviewed the medical record, interviewed the patient and family, and examined the patient. The following aspects are pertinent.  Past Medical History:  Diagnosis Date  . Abdominal or pelvic swelling, mass, or lump, left upper quadrant   . CHF (congestive heart failure) ([place])   . Chronic atrial fibrillation ([place])   . Chronic kidney disease   . Congestive heart failure, unspecified   . Coronary atherosclerosis of unspecified type of vessel, native or graft   . Diabetes mellitus   . Edema   . GERD (gastroesophageal reflux disease)   . Gout, unspecified   . Hypercholesterolemia   . Hypertension   . 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  . Peripheral vascular disease, unspecified ([place])   . Seizures ([place])   . Sinus of Valsalva aneurysm    a. By 2D echo 05/2011.  . Stroke ([place])   . Unspecified hereditary and idiopathic peripheral neuropathy   . Unspecified urinary incontinence   . Vascular dementia, uncomplicated    Social History   Social History  . Marital status: Widowed    Spouse name: N/A  . Number of children: N/A  . Years of education: N/A   Social History Main Topics  . Smoking status: Former Smoker    Types: Cigarettes    Quit date: 01/29/2001  . Smokeless tobacco: Never Used  . Alcohol use No  . Drug use: No  . Sexual activity: No   Other Topics Concern  . None   Social History Narrative    . None   Family History  Problem Relation Age of Onset  . Cancer Mother        Cervical  . Stroke Mother   . Diabetes Mother   . Stroke Father   . Heart disease Father    Scheduled Meds: . amantadine  100 mg Per Tube BID  . atorvastatin  10 mg Per Tube QHS  . donepezil  10 mg Per Tube QHS  . heparin  5,000 Units Subcutaneous Q8H  . insulin aspart  0-9 Units Subcutaneous TID WC  . insulin glargine  7 Units Subcutaneous QHS  . mouth rinse  15 mL Mouth Rinse BID  . metoprolol tartrate  25 mg Per Tube BID  . mirtazapine  7.5 mg Per Tube QHS  . pantoprazole (PROTONIX) IV  40 mg Intravenous Q24H   Continuous Infusions: . diltiazem (CARDIZEM) infusion 15 mg/hr (11/18/16 0720)  . levETIRAcetam Stopped (11/17/16 2234)  . piperacillin-tazobactam (ZOSYN)  IV 3.375 g (11/18/16 0721)  . vancomycin Stopped (11/17/16 1330)   PRN Meds:.acetaminophen Allergies  Allergen Reactions  . Penicillins Other (See Comments)    Has patient had a PCN reaction causing immediate rash, facial/tongue/throat swelling, SOB or lightheadedness with hypotension: Unk Has patient had a PCN reaction causing severe rash involving mucus membranes or skin necrosis: Unk Has patient had a PCN reaction that required hospitalization: Unk Has patient had a PCN reaction occurring within the last 10 years: Unk If all of the above answers are "NO", then may proceed with Ceph *has tolerated cephalosporins, amoxicillin, and Zosyn*   Review of Systems   Physical Exam  Cachectic female, awake, responsive to touch and voice but unable to speak, mildly diaphorectic Resp slightly increased work of breathing Abdomen soft, nd Ext 1+ edema bilaterally.  Bandages on heel wounds Unable to follow commands.    Vital Signs: BP 119/72 (BP Location: Right Arm)   Pulse 91   Temp 99.2 F (37.3 C) (Axillary)   Resp (!) 34   Ht 5' (1.524 m)   Wt 61.4 kg (135 lb 5.8 oz)   SpO2 97%   BMI 26.44 kg/m  Pain Assessment: 0-10    Pain Score: Asleep   SpO2: SpO2: 97 % O2 Device:SpO2: 97 % O2 Flow Rate: .O2 Flow Rate (L/min): 2 L/min  IO: Intake/output summary:  Intake/Output Summary (Last 24 hours) at 11/18/16 1015 Last data filed at 11/18/16 0500  Gross per 24 hour  Intake             1172 ml  Output              700 ml  Net              472 ml    LBM: Last BM Date: 11/17/16 Baseline Weight: Weight: 67.5 kg (148 lb 13 oz) Most recent weight: Weight: 61.4 kg (135 lb 5.8 oz)     Palliative Assessment/Data:20%     Time In: 3:00 Time Out: 4:14 Time Total: 74 min. Greater than 50%  of this time was spent counseling and coordinating care related to the above assessment and plan.  Signed by: Florentina Jenny, PA-C Palliative Medicine Pager: [phone]  Please contact Palliative Medicine Team phone at [phone] for questions and concerns.  For individual provider: See Shea Evans

## 2016-11-18 NOTE — Progress Notes (Signed)
Pharmacy Antibiotic Note  Casey [name] is a 76 y.o. female continues on day #3 vancomycin and zosyn for sepsis. Renal function worsening today with sCr 1.68 > 2.32 and CrCl now 17 ml/min. Will adjust antibiotics accordingly.   Vancomycin trough goal 15-20  Plan: 1) Change vancomycin to 750mg  IV q48 2) Change zosyn to 2.25g IV q8  Height: 5' (152.4 cm) Weight: 135 lb 5.8 oz (61.4 kg) IBW/kg (Calculated) : 45.5  Temp (24hrs), Avg:99.6 F (37.6 C), Min:99 F (37.2 C), Max:100.5 F (38.1 C)   Recent Labs Lab 11/15/16 0457 11/16/16 1010 11/16/16 1026 11/16/16 1252 11/16/16 1555 11/17/16 0245 11/18/16 0537  WBC 8.9 9.2  --   --  8.8 8.8 9.8  CREATININE 1.70* 1.65*  --   --  1.66* 1.68* 2.32*  LATICACIDVEN  --   --  2.08* 1.31  --   --   --     Estimated Creatinine Clearance: 16.9 mL/min (A) (by C-G formula based on SCr of 2.32 mg/dL (H)).    Allergies  Allergen Reactions  . Penicillins Other (See Comments)    Has patient had a PCN reaction causing immediate rash, facial/tongue/throat swelling, SOB or lightheadedness with hypotension: Unk Has patient had a PCN reaction causing severe rash involving mucus membranes or skin necrosis: Unk Has patient had a PCN reaction that required hospitalization: Unk Has patient had a PCN reaction occurring within the last 10 years: Unk If all of the above answers are "NO", then may proceed with Ceph *has tolerated cephalosporins, amoxicillin, and Zosyn*    Antimicrobials this admission: Vancomycin 10/19>> Zosyn 10/19>>  Microbiology results: 10/19 blood x2 >> ngtd  Thank you for allowing pharmacy to be a part of this patient's care.  Deboraha Sprang 11/18/2016 12:14 PM

## 2016-11-19 DIAGNOSIS — R627 Adult failure to thrive: Secondary | ICD-10-CM

## 2016-11-19 DIAGNOSIS — E1122 Type 2 diabetes mellitus with diabetic chronic kidney disease: Secondary | ICD-10-CM

## 2016-11-19 DIAGNOSIS — Z515 Encounter for palliative care: Secondary | ICD-10-CM

## 2016-11-19 DIAGNOSIS — F0390 Unspecified dementia without behavioral disturbance: Secondary | ICD-10-CM

## 2016-11-19 DIAGNOSIS — I739 Peripheral vascular disease, unspecified: Secondary | ICD-10-CM

## 2016-11-19 MED ORDER — GLYCOPYRROLATE 0.2 MG/ML IJ SOLN
0.2000 mg | INTRAMUSCULAR | Status: DC | PRN
  Administered 2016-11-19: 0.2 mg via INTRAVENOUS
  Filled 2016-11-19 (×2): qty 1

## 2016-11-19 MED ORDER — MORPHINE SULFATE (PF) 2 MG/ML IV SOLN
2.0000 mg | INTRAVENOUS | Status: DC | PRN
  Administered 2016-11-19 – 2016-11-20 (×5): 2 mg via INTRAVENOUS
  Filled 2016-11-19 (×5): qty 1

## 2016-11-19 MED ORDER — GLYCOPYRROLATE 1 MG PO TABS
1.0000 mg | ORAL_TABLET | ORAL | Status: DC | PRN
  Filled 2016-11-19: qty 1

## 2016-11-19 MED ORDER — MORPHINE SULFATE (PF) 2 MG/ML IV SOLN: 2.0000 mg | INTRAVENOUS | Status: DC | PRN

## 2016-11-19 MED ORDER — GLYCOPYRROLATE 0.2 MG/ML IJ SOLN
0.2000 mg | INTRAMUSCULAR | Status: DC | PRN
  Filled 2016-11-19: qty 1

## 2016-11-19 MED ORDER — ACETAMINOPHEN 160 MG/5ML PO SOLN
650.0000 mg | Freq: Three times a day (TID) | ORAL | Status: DC
  Administered 2016-11-19 – 2016-11-20 (×4): 650 mg
  Filled 2016-11-19 (×4): qty 20.3

## 2016-11-19 MED ORDER — LEVETIRACETAM 100 MG/ML PO SOLN
250.0000 mg | Freq: Two times a day (BID) | ORAL | Status: DC
  Administered 2016-11-19 – 2016-11-20 (×2): 250 mg
  Filled 2016-11-19 (×3): qty 2.5

## 2016-11-19 MED ORDER — LORAZEPAM 2 MG/ML IJ SOLN: 0.5000 mg | INTRAMUSCULAR | Status: DC | PRN

## 2016-11-19 MED ORDER — LEVETIRACETAM 250 MG PO TABS: 250.0000 mg | ORAL_TABLET | Freq: Two times a day (BID) | ORAL | Status: DC

## 2016-11-19 MED ORDER — MORPHINE SULFATE (CONCENTRATE) 10 MG/0.5ML PO SOLN
5.0000 mg | Freq: Two times a day (BID) | ORAL | Status: DC
  Administered 2016-11-19: 5 mg via SUBLINGUAL
  Filled 2016-11-19 (×2): qty 0.5

## 2016-11-19 NOTE — Progress Notes (Signed)
PROGRESS NOTE  Casey [name] DPO:[id number] DOB: 12/15/1940 DOA: 11/16/2016 PCP: Gildardo Cranker, DO  HPI/Recap of past 85 hours: 76 year old female with a history of CVA, dementia, CKD, seizure disorders, type 2 diabetes mellitus, chronic A. fib, recurrent aspiration pneumonia recently discharged on 10/18 after being hospitalized for altered mental status, UTI, aspiration pneumonia and A. fib with RVR.  Patient presented again on 10/19 with similar complaints despite completing 7 days of IV levofloxacin on last admission.  Patient is presented with sepsis, likely from another episode of recurrent aspiration pneumonia despite PEG tube placement on 9/20  Today, patient still ill looking, less responsive, heart rate controlled, worsening tachypnea with diaphoresis.  Review of systems cannot be done as patient has a history of dementia and is unable to communicate effectively.  Due to patient's declining medical condition, palliative team was consulted.  Patient is currently DNR and in hospice care.  Assessment/Plan: Principal Problem:   Atrial fibrillation with RVR (HCC) Active Problems:   Atrial fibrillation (HCC)   Type 2 diabetes mellitus with diabetic neuropathy (HCC)   AKI (acute kidney injury) ([place])   Dementia without behavioral disturbance   PVD (peripheral vascular disease) (HCC)   Chronic systolic congestive heart failure ([place])   CKD stage 3 due to type 2 diabetes mellitus (HCC)   Type 2 diabetes mellitus with diabetic neuropathy, without long-term current use of insulin (HCC)   Anemia   Chronic kidney disease (CKD), stage III (moderate) (HCC)   Acute on chronic systolic CHF (congestive heart failure), NYHA class 4 (HCC)   Hypernatremia   Failure to thrive in adult   Sepsis [facility])   Palliative care encounter   Terminal care  #Severe sepsis Likely secondary to aspiration pneumonia As patient is being transitioned to hospice, will DC all further management  #Chronic A. fib  with RVR Heart rate somewhat controlled on Cardizem drip Due to patient being transitioned to hospice will discontinue drip Family still wants to keep metoprolol 50 twice daily No anticoagulation due to history of GI bleed  #Chronic systolic CHF EF 43-15% Patient with poor prognosis not a candidate for invasive procedures and transitioning to hospice  #CKD No further intervention, as patient is transitioning to hospice  #Right upper brachial vein DVT Recent GI bleed, not a candidate for anticoagulation  #Severe dementia Discontinue Aricept, amantadine  #Pressure ulcer bilateral heels WOC on board  Code Status: DNR  Family Communication: None today  Disposition Plan: Hospice home   Consultants:  Palliative team  Procedures:  None  Antimicrobials:  None  DVT prophylaxis: SCDs   Objective: Vitals:   11/18/16 2012 11/19/16 0017 11/19/16 0300 11/19/16 0852  BP: (!) 125/59 (!) 105/58 (!) 121/54 131/74  Pulse: 95 78 88 (!) 113  Resp: (!) 37 (!) 45 (!) 33 (!) 41  Temp: 99.6 F (37.6 C) 98.7 F (37.1 C) 98.7 F (37.1 C) 99.1 F (37.3 C)  TempSrc: Axillary Oral Oral Oral  SpO2: 100% 100% 99% 99%  Weight:      Height:        Intake/Output Summary (Last 24 hours) at 11/19/16 1242 Last data filed at 11/19/16 0651  Gross per 24 hour  Intake              120 ml  Output                0 ml  Net              120 ml  Filed Weights   11/16/16 2300 11/17/16 0411  Weight: 67.5 kg (148 lb 13 oz) 61.4 kg (135 lb 5.8 oz)    Exam:   General: Ill-appearing, able to communicate  Cardiovascular: Irregularly irregular, no murmurs  Respiratory: Tachypneic, bibasilar crackles  Abdomen: Soft, nontender, PEG tube in place  Musculoskeletal: No pedal edema, ulcers on bilateral heel  Skin: Multiple pressure ulcers  Psychiatry: Unable to determine mood   Data Reviewed: CBC:  Recent Labs Lab 11/15/16 0457 11/16/16 1010 11/16/16 1555 11/17/16 0245  11/18/16 0537  WBC 8.9 9.2 8.8 8.8 9.8  NEUTROABS  --  7.2  --   --  7.4  HGB 8.7* 9.0* 9.0* 8.8* 9.3*  HCT 31.2* 32.7* 32.4* 32.0* 33.2*  MCV 86.4 85.8 86.4 85.8 84.9  PLT 227 212 214 212 149   Basic Metabolic Panel:  Recent Labs Lab 11/12/16 1753  11/14/16 0658 11/15/16 0457 11/16/16 1010 11/16/16 1555 11/17/16 0245 11/18/16 0537  NA 143  < > 150* 149* 154*  --  152* 153*  K 3.4*  < > 3.5 3.5 3.8  --  3.9 3.8  CL 104  < > 108 109 111  --  109 112*  CO2 31  < > 33* 30 35*  --  33* 31  GLUCOSE 232*  < > 206* 190* 190*  --  200* 234*  BUN 60*  < > 61* 65* 65*  --  61* 64*  CREATININE 1.89*  < > 1.67* 1.70* 1.65* 1.66* 1.68* 2.32*  CALCIUM 10.0  < > 10.3 10.3 10.7*  --  10.1 10.2  MG 2.0  --  1.9  --  2.3  --   --   --   < > = values in this interval not displayed. GFR: Estimated Creatinine Clearance: 16.9 mL/min (A) (by C-G formula based on SCr of 2.32 mg/dL (H)). Liver Function Tests:  Recent Labs Lab 11/16/16 1010 11/17/16 0245  AST 31 25  ALT 18 14  ALKPHOS 101 79  BILITOT 0.6 0.8  PROT 7.3 6.7  ALBUMIN 2.7* 2.5*   No results for input(s): LIPASE, AMYLASE in the last 168 hours. No results for input(s): AMMONIA in the last 168 hours. Coagulation Profile:  Recent Labs Lab 11/16/16 1555  INR 1.30   Cardiac Enzymes: No results for input(s): CKTOTAL, CKMB, CKMBINDEX, TROPONINI in the last 168 hours. BNP (last 3 results) No results for input(s): PROBNP in the last 8760 hours. HbA1C: No results for input(s): HGBA1C in the last 72 hours. CBG:  Recent Labs Lab 11/15/16 1652 11/16/16 2102 11/17/16 1631 11/18/16 0600 11/18/16 2046  GLUCAP 195* 188* 198* 214* 182*   Lipid Profile: No results for input(s): CHOL, HDL, LDLCALC, TRIG, CHOLHDL, LDLDIRECT in the last 72 hours. Thyroid Function Tests:  Recent Labs  11/16/16 1555  TSH 4.900*   Anemia Panel: No results for input(s): VITAMINB12, FOLATE, FERRITIN, TIBC, IRON, RETICCTPCT in the last 72  hours. Urine analysis:    Component Value Date/Time   COLORURINE YELLOW 11/16/2016 1211   APPEARANCEUR HAZY (A) 11/16/2016 1211   LABSPEC 1.015 11/16/2016 1211   PHURINE 5.0 11/16/2016 1211   GLUCOSEU NEGATIVE 11/16/2016 1211   HGBUR NEGATIVE 11/16/2016 1211   BILIRUBINUR NEGATIVE 11/16/2016 [place] 11/16/2016 1211   PROTEINUR 30 (A) 11/16/2016 1211   UROBILINOGEN 0.2 02/02/2013 2008   NITRITE NEGATIVE 11/16/2016 1211   LEUKOCYTESUR TRACE (A) 11/16/2016 1211   Sepsis Labs: @LABRCNTIP (procalcitonin:4,lacticidven:4)  ) Recent Results (from the past  240 hour(s))  Blood Culture (routine x 2)     Status: None (Preliminary result)   Collection Time: 11/16/16 10:10 AM  Result Value Ref Range Status   Specimen Description BLOOD LEFT FOREARM  Final   Special Requests   Final    BOTTLES DRAWN AEROBIC AND ANAEROBIC Blood Culture adequate volume   Culture NO GROWTH 3 DAYS  Final   Report Status PENDING  Incomplete  Blood Culture (routine x 2)     Status: None (Preliminary result)   Collection Time: 11/16/16 11:15 AM  Result Value Ref Range Status   Specimen Description BLOOD LEFT HAND  Final   Special Requests   Final    BOTTLES DRAWN AEROBIC AND ANAEROBIC Blood Culture adequate volume   Culture NO GROWTH 3 DAYS  Final   Report Status PENDING  Incomplete      Studies: No results found.  Scheduled Meds: . acetaminophen (TYLENOL) oral liquid 160 mg/5 mL  650 mg Per Tube TID  . levETIRAcetam  250 mg Per Tube BID  . mouth rinse  15 mL Mouth Rinse BID  . metoprolol tartrate  50 mg Per Tube BID  . mirtazapine  7.5 mg Per Tube QHS  . morphine CONCENTRATE  5 mg Sublingual BID    Continuous Infusions:   LOS: 3 days     Alma Friendly, MD [facility] Pager [phone]  If 7PM-7AM, please contact night-coverage [email or web address] Password [email or web address] 11/19/2016, 12:42 PM

## 2016-11-19 NOTE — Progress Notes (Signed)
Daily Progress Note   Patient Name: Casey [name]       Date: 11/19/2016 DOB: Oct 05, 1940  Age: 76 y.o. MRN#: [id number] Attending Physician: Alma Friendly, MD Primary Care Physician: Gildardo Cranker, DO Admit Date: 11/16/2016  Reason for Consultation/Follow-up: Establishing goals of care, Non pain symptom management, Pain control, Psychosocial/spiritual support and Terminal Care  Subjective: Patient appears less responsive today.  Grandchildren at bedside.  I talked to her daughter Fraser Din) on the phone.  I expressed that the patient appears less responsive and that [facility] is full right now.   We agreed to d/c IV Gtt meds and place on oral meds.  Also agreed to transfer to 6N.   Assessment: Patient beginning the active dying process.  She appears to have a fever (warm and diaphoretic).   Patient Profile/HPI:  76 y.o. female  with past medical history of CVA s/p PEG, dementia, CHF with an EF of 20-25%, CKD, Seizures, DM, Afib with RVR, and bilateral lower extremity wounds, who was admitted on 11/16/2016 with CHF exacerbation and aspiration pneumonia causing severe sepsis. She remains on a cardizem gtt due to RVR.  She has had 5 hospitalizations in the last 3 months.  She has a PEG in place and is NPO.  The family has met with Palliative Care multiple times in the past.  MD is recommending comfort measures.  Length of Stay: 3  Current Medications: Scheduled Meds:  . acetaminophen (TYLENOL) oral liquid 160 mg/5 mL  650 mg Per Tube TID  . levETIRAcetam  250 mg Oral BID  . mouth rinse  15 mL Mouth Rinse BID  . metoprolol tartrate  50 mg Per Tube BID  . mirtazapine  7.5 mg Per Tube QHS  . morphine CONCENTRATE  5 mg Sublingual BID    Continuous Infusions:   PRN  Meds: antiseptic oral rinse, glycopyrrolate **OR** glycopyrrolate **OR** glycopyrrolate, haloperidol **OR** haloperidol **OR** haloperidol lactate, morphine CONCENTRATE, polyvinyl alcohol  Physical Exam        Thin frail demented female, eyes open, facing right. diaphoretic CV RRR resp no distress Abdomen soft nd  Vital Signs: BP 131/74 (BP Location: Right Arm)   Pulse (!) 113   Temp 99.1 F (37.3 C) (Oral)   Resp (!) 41   Ht 5' (1.524 m)   Wt 61.4  kg (135 lb 5.8 oz)   SpO2 99%   BMI 26.44 kg/m  SpO2: SpO2: 99 % O2 Device: O2 Device: Nasal Cannula O2 Flow Rate: O2 Flow Rate (L/min): 2 L/min  Intake/output summary:  Intake/Output Summary (Last 24 hours) at 11/19/16 1113 Last data filed at 11/19/16 0651  Gross per 24 hour  Intake              120 ml  Output                0 ml  Net              120 ml   LBM: Last BM Date: 11/17/16 Baseline Weight: Weight: 67.5 kg (148 lb 13 oz) Most recent weight: Weight: 61.4 kg (135 lb 5.8 oz)       Palliative Assessment/Data: 10%      Patient Active Problem List   Diagnosis Date Noted  . Pressure injury of skin 11/07/2016  . Deep tissue injury 11/05/2016  . Malnutrition of moderate degree 11/05/2016  . Sepsis ([place]) 11/04/2016  . Hypercalcemia 11/04/2016  . Aspiration pneumonia ([place]) 11/04/2016  . Acute metabolic encephalopathy 40/98/1191  . Elevated troponin 11/04/2016  . Atrial fibrillation with RVR ([place]) 11/04/2016  . Altered mental status   . Lactic acidosis   . Acute renal failure superimposed on stage 3 chronic kidney disease ([place]) 10/05/2016  . Thrombocytopenia ([place]) 10/05/2016  . Esophageal dysphagia 10/05/2016  . Candida UTI 10/05/2016  . Anemia due to gastrointestinal blood loss 10/05/2016  . Seizure disorder ([place]) 10/05/2016  . Hypokalemia 10/05/2016  . Hypomagnesemia 10/05/2016  . Hypernatremia 10/05/2016  . Severe protein-calorie malnutrition ([place]) 10/05/2016  . Failure to thrive in adult 10/05/2016  .  HCAP (healthcare-associated pneumonia)   . Encounter for palliative care   . Goals of care, counseling/discussion   . Acute respiratory failure with hypoxia ([place])   . Acute on chronic systolic CHF (congestive heart failure), NYHA class 4 ([place])   . DCM (dilated cardiomyopathy) ([place])   . Pneumonia due to gram-positive bacteria 09/14/2016  . Hypotension 09/10/2016  . Chronic kidney disease (CKD), stage III (moderate) ([place]) 09/10/2016  . Acute generalized abdominal pain 09/07/2016  . Acute chest pain 09/07/2016  . Atrial fibrillation with rapid ventricular response ([place]) 09/07/2016  . Wound discharge 09/07/2016  . Anemia 09/07/2016  . Femoral-femoral bypass graft thrombosis, left ([place]) 09/03/2016  . Pseudoaneurysm of femoral artery ([place]) 08/30/2016  . Vascular dementia without behavioral disturbance 08/29/2016  . History of CVA with residual deficit 08/29/2016  . Type 2 diabetes mellitus with diabetic neuropathy, without long-term current use of insulin ([place]) 08/29/2016  . Depression 04/24/2016  . Loss of weight 04/24/2016  . Hypertensive heart disease with congestive heart failure ([place]) 12/30/2014  . Dyslipidemia associated with type 2 diabetes mellitus ([place]) 12/30/2014  . CKD stage 3 due to type 2 diabetes mellitus ([place]) 12/06/2014  . 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 Chronic systolic congestive heart failure ([place]) 02/09/2013  . Solitary pulmonary nodule 02/09/2013  . Seizures ([place])   . PVD (peripheral vascular disease) ([place]) 09/30/2012  . Gout of multiple sites 05/06/2012  . Dementia without behavioral disturbance 05/06/2012  . Atherosclerosis of native artery of extremity with intermittent claudication ([place]) 02/20/2012  . AKI (acute kidney injury) ([place]) 06/18/2011  . Type 2 diabetes mellitus with diabetic neuropathy ([place]) 12/29/2010  . Atrial fibrillation ([place])   . Late effects of CVA (cerebrovascular accident)     Palliative Care Plan  Recommendations/Plan:  Discussed with  attending physician and family -  Will change keppra to per tube.    Wean off dilt gtt.  Primary team increased metoprolol.  Will schedule tylenol given fever and pain  Will schedule low dose oral morphine for comfort.  Also has PRN morphine  [facility]waiting [facility] bed.  Not appropriate for previous Nursing facility.    Goals of Care and Additional Recommendations:  Limitations on Scope of Treatment: Full Comfort Care  Code Status:  DNR  Prognosis:   Hours - Days    Discharge Planning:  Anticipated Hospital Death vs [place] was discussed with family, physician  Thank you for allowing the Palliative Medicine Team to assist in the care of this patient.  Total time spent:  35 min.     Greater than 50%  of this time was spent counseling and coordinating care related to the above assessment and plan.  Florentina Jenny, PA-C Palliative Medicine  Please contact Palliative MedicineTeam phone at [phone] for questions and concerns between 7 am - 7 pm.   Please see AMION for individual provider pager numbers.

## 2016-11-19 NOTE — Care Management Note (Signed)
Case Management Note  Patient Details  Name: Casey [name] MRN: [id number] Date of Birth: February 01, 1940  Subjective/Objective:  76 year old female with a history of CVA, dementia, CKD, seizure disorders, type 2 diabetes mellitus, chronic A. fib, recurrent aspiration pneumonia recently discharged on 10/18 after being hospitalized for altered mental status, UTI, aspiration pneumonia and A. fib with RVR.  Patient presented again on 10/19 with similar complaints despite completing 7 days of IV levofloxacin on last admission.  Patient is presented with sepsis, likely from another episode of recurrent aspiration pneumonia despite PEG tube placement on 9/20.                    Action/Plan:  Palliative Medicine Team following patient.  Family wishes to offer comfort measures, with ultimate discharge to [facility] upon bed availability.   CSW working with pt/family to facilitate this.    Expected Discharge Date:                  Expected Discharge Plan:  [place]  In-House Referral:  Clinical Social Work  Discharge planning Services  CM Consult  Post Acute Care Choice:    Choice offered to:     DME Arranged:    DME Agency:     HH Arranged:    [place] Agency:     Status of Service:  In process, will continue to follow  If discussed at Long Length of Stay Meetings, dates discussed:    Additional Comments:  Reinaldo Raddle, RN, BSN  Trauma/Neuro ICU Case Manager [phone]

## 2016-11-19 NOTE — Progress Notes (Signed)
Clinical Social Worker received a text from [place] from [facility] statin that at this time [facility] still does not have a bed available for patient. CSW is still following patient/family for support and disposition needs.   Rhea Pink, MSW,  [place]

## 2016-11-20 DIAGNOSIS — D649 Anemia, unspecified: Secondary | ICD-10-CM

## 2016-11-20 MED ORDER — METOPROLOL TARTRATE 50 MG PO TABS: 50.0000 mg | ORAL_TABLET | Freq: Two times a day (BID) | ORAL | Status: AC

## 2016-11-20 NOTE — Care Management Important Message (Signed)
Important Message  Patient Details  Name: Casey [name] MRN: [id number] Date of Birth: 21-Jan-1941   Medicare Important Message Given:  Yes    Nathen May , 9:21 AM

## 2016-11-20 NOTE — Progress Notes (Signed)
CSW spoke to [facility] this morning and stated they will be able to offer patient a bed today. CSW made MD aware of patient's bed offer. MD stated she will work on summary and discharge order. Palliative team spoke with RN and stated patient will be able to go today.   Rhea Pink, MSW,  [place]

## 2016-11-20 NOTE — Progress Notes (Signed)
Clinical Social Worker facilitated patient discharge including contacting patient family and facility to confirm patient discharge plans.  Clinical information faxed to facility and family agreeable with plan.  CSW arranged ambulance transport via [facility] to [facility] .  RN Lauren to call [phone] for report prior to discharge.  Clinical Social Worker will sign off for now as social work intervention is no longer needed. Please consult [place] again if new need arises.  Rhea Pink, MSW, [place]

## 2016-11-20 NOTE — Discharge Summary (Signed)
Discharge Summary  Casey [name] OHY:[id number] DOB: 1941/01/21  PCP: Gildardo Cranker, DO  Admit date: 11/16/2016 Discharge date:   Time spent: >30 mins  Recommendations for Outpatient Follow-up:  Hospice  Discharge Diagnoses:  Active Hospital Problems   Diagnosis Date Noted  . Atrial fibrillation with RVR ([place]) 11/04/2016  . Palliative care encounter   . Terminal care   . Sepsis ([place]) 11/04/2016  . Hypernatremia 10/05/2016  . Failure to thrive in adult 10/05/2016  . Acute on chronic systolic CHF (congestive heart failure), NYHA class 4 ([place])   . Chronic kidney disease (CKD), stage III (moderate) ([place]) 09/10/2016  . Anemia 09/07/2016  . Type 2 diabetes mellitus with diabetic neuropathy, without long-term current use of insulin ([place]) 08/29/2016  . CKD stage 3 due to type 2 diabetes mellitus ([place]) 12/06/2014  . Chronic systolic congestive heart failure ([place]) 02/09/2013  . PVD (peripheral vascular disease) ([place]) 09/30/2012  . Dementia without behavioral disturbance 05/06/2012  . AKI (acute kidney injury) ([place]) 06/18/2011  . Type 2 diabetes mellitus with diabetic neuropathy ([place]) 12/29/2010  . Atrial fibrillation [facility])     Resolved Hospital Problems   Diagnosis Date Noted Date Resolved  No resolved problems to display.    Discharge Condition: Poor prognosis, transferring to hospice  Diet recommendation: Nothing by mouth  Vitals:   11/19/16 1511  1027  BP:    Pulse:    Resp:    Temp: 100 F (37.8 C) (!) 100.7 F (38.2 C)  SpO2:      History of present illness:  76 year old female with a history of CVA, dementia, CKD, seizure disorders, type 2 diabetes mellitus, chronic A. fib, recurrent aspiration pneumonia was admitted due to sepsis likely from recurrent aspiration pneumonia despite PEG tube placement on 9/20.  Patient's was also noted to be in A. fib with RVR.  Due to patient's declining medical condition, palliative team was consulted,  patient was made DNR and is currently in hospice care.  Hospital Course:  Principal Problem:   Atrial fibrillation with RVR (HCC) Active Problems:   Atrial fibrillation (HCC)   Type 2 diabetes mellitus with diabetic neuropathy (HCC)   AKI (acute kidney injury) ([place])   Dementia without behavioral disturbance   PVD (peripheral vascular disease) (HCC)   Chronic systolic congestive heart failure ([place])   CKD stage 3 due to type 2 diabetes mellitus ([place])   Type 2 diabetes mellitus with diabetic neuropathy, without long-term current use of insulin (HCC)   Anemia   Chronic kidney disease (CKD), stage III (moderate) (HCC)   Acute on chronic systolic CHF (congestive heart failure), NYHA class 4 (HCC)   Hypernatremia   Failure to thrive in adult   Sepsis [facility])   Palliative care encounter   Terminal care  Patient presented with severe sepsis and was managed aggressively until patient became hospice, will continue with only Tylenol for fever and pain.  Patient also has chronic A. fib with RVR, family still wants to keep metoprolol 50 twice daily via G-tube, will continue that.  Patient also has a seizure disorder and will continue with Keppra via G-tube.  Continue mirtazapine. Patient has been transitioned to hospice care and has morphine as needed and all the comfort care measures in place.  Procedures:  None  Consultations:  Palliative team  Discharge Exam: BP 131/74 (BP Location: Right Arm)   Pulse (!) 113   Temp (!) 100.7 F (38.2 C) (Axillary)   Resp (!) 41   Ht 5' (1.524  m)   Wt 61.4 kg (135 lb 5.8 oz)   SpO2 99%   BMI 26.44 kg/m   General: Ill-appearing, less responsive Cardiovascular: Irregularly irregular no murmurs Respiratory: Tachypneic, bibasilar crackles  Discharge Instructions You were cared for by a hospitalist during your hospital stay. If you have any questions about your discharge medications or the care you received while you were in the hospital after you are  discharged, you can call the unit and asked to speak with the hospitalist on call if the hospitalist that took care of you is not available. Once you are discharged, your primary care physician will handle any further medical issues. Please note that NO REFILLS for any discharge medications will be authorized once you are discharged, as it is imperative that you return to your primary care physician (or establish a relationship with a primary care physician if you do not have one) for your aftercare needs so that they can reassess your need for medications and monitor your lab values.  Discharge Instructions    Amb referral to [facility]    Complete by:  As directed      Allergies as of       Reactions   Penicillins Other (See Comments)   Has patient had a PCN reaction causing immediate rash, facial/tongue/throat swelling, SOB or lightheadedness with hypotension: Unk Has patient had a PCN reaction causing severe rash involving mucus membranes or skin necrosis: Unk Has patient had a PCN reaction that required hospitalization: Unk Has patient had a PCN reaction occurring within the last 10 years: Unk If all of the above answers are "NO", then may proceed with Ceph *has tolerated cephalosporins, amoxicillin, and Zosyn*      Medication List    STOP taking these medications   allopurinol 100 MG tablet Commonly known as:  ZYLOPRIM   Amantadine HCl 100 MG tablet   atorvastatin 10 MG tablet Commonly known as:  LIPITOR   donepezil 10 MG tablet Commonly known as:  ARICEPT   feeding supplement (PRO-STAT SUGAR FREE 64) Liqd   ferrous sulfate 220 (44 Fe) MG/5ML solution   free water Soln   insulin glargine 100 unit/mL Sopn Commonly known as:  LANTUS   pantoprazole 40 MG tablet Commonly known as:  PROTONIX   THERA-M Tabs     TAKE these medications   levETIRAcetam 250 MG tablet Commonly known as:  KEPPRA Give 1 tablet via G-Tube two times daily   metoprolol tartrate 50  MG tablet Commonly known as:  LOPRESSOR Place 1 tablet (50 mg total) into feeding tube 2 (two) times daily. What changed:  medication strength  how much to take   mirtazapine 7.5 MG tablet Commonly known as:  REMERON Place 7.5 mg into feeding tube at bedtime.      Allergies  Allergen Reactions  . Penicillins Other (See Comments)    Has patient had a PCN reaction causing immediate rash, facial/tongue/throat swelling, SOB or lightheadedness with hypotension: Unk Has patient had a PCN reaction causing severe rash involving mucus membranes or skin necrosis: Unk Has patient had a PCN reaction that required hospitalization: Unk Has patient had a PCN reaction occurring within the last 10 years: Unk If all of the above answers are "NO", then may proceed with Ceph *has tolerated cephalosporins, amoxicillin, and Zosyn*      The results of significant diagnostics from this hospitalization (including imaging, microbiology, ancillary and laboratory) are listed below for reference.    Significant Diagnostic Studies: Ct Head  Wo Contrast  Result Date: 11/16/2016 CLINICAL DATA:  Altered mental status EXAM: CT HEAD WITHOUT CONTRAST TECHNIQUE: Contiguous axial images were obtained from the base of the skull through the vertex without intravenous contrast. COMPARISON:  02/02/2013 FINDINGS: Brain: There are changes consistent with prior right occipital infarct as well as left MCA infarct distribution no acute hemorrhage, acute infarction or space-occupying mass lesion are seen. Cavum septum pellucidum is again noted. Vascular: No hyperdense vessel or unexpected calcification. Skull: Normal. Negative for fracture or focal lesion. Sinuses/Orbits: No acute finding. Other: None. IMPRESSION: Chronic bilateral infarcts stable from the prior exam. No acute abnormality noted. Electronically Signed   By: Inez Catalina M.D.   On: 11/16/2016 14:59   Dg Chest Port 1 View  Result Date: 11/16/2016 CLINICAL DATA:   Fever. EXAM: PORTABLE CHEST 1 VIEW COMPARISON:  Radiograph of November 15, 2016. FINDINGS: Stable cardiomegaly. Atherosclerosis of thoracic aorta is noted. Stable mild central pulmonary vascular congestion is noted. No pneumothorax is noted. Mild right upper lobe atelectasis or infiltrate is noted. Mild bibasilar atelectasis or edema is noted with associated small pleural effusions. Bony thorax is unremarkable. IMPRESSION: Aortic atherosclerosis. Stable cardiomegaly with mild central pulmonary vascular congestion. Stable right upper lobe opacity is noted concerning for atelectasis or infiltrate. Mild bibasilar atelectasis or edema is noted with associated pleural effusions. Electronically Signed   By: Marijo Conception, M.D.   On: 11/16/2016 11:49   Dg Chest Port 1 View  Result Date: 11/15/2016 CLINICAL DATA:  Encounter for shortness of breath . EXAM: PORTABLE CHEST 1 VIEW COMPARISON:  11/11/2016 . FINDINGS: Cardiomegaly with pulmonary vascular prominence and bilateral interstitial prominence bilateral pleural effusions. Findings consistent congestive heart failure. Findings have progressed slightly from prior exam. IMPRESSION: Congestive heart failure bilateral from interstitial edema bilateral pleural effusions. Slight progression from prior exam. Electronically Signed   By: Marcello Moores  Register   On: 11/15/2016 11:32   Dg Chest Port 1 View  Result Date: 11/11/2016 CLINICAL DATA:  Dyspnea EXAM: PORTABLE CHEST 1 VIEW COMPARISON:  11/09/2016 FINDINGS: Marked cardiomegaly. Central airspace opacities likely represent edema. Small pleural effusions bilaterally. Moderate vascular and interstitial prominence. Little or no interval change from the prior. IMPRESSION: The findings likely represent congestive heart failure with interstitial and alveolar edema as well as bilateral effusions. Electronically Signed   By: Andreas Newport M.D.   On: 11/11/2016 03:09   Dg Chest Port 1 View  Result Date:  11/09/2016 CLINICAL DATA:  Shortness of breath, wheezing EXAM: PORTABLE CHEST 1 VIEW COMPARISON:  11/05/2016 FINDINGS: Cardiomegaly with vascular congestion. Bilateral lower lobe airspace opacities and effusions. Interstitial prominence, likely interstitial edema. IMPRESSION: Changes of CHF.  Small bilateral pleural effusions. Electronically Signed   By: Rolm Baptise M.D.   On: 11/09/2016 11:13   Dg Chest Port 1 View  Result Date: 11/05/2016 CLINICAL DATA:  Tachypnea.  History of CHF, former smoker, CLL EXAM: PORTABLE CHEST 1 VIEW COMPARISON:  Chest x-ray of November 03, 2016 FINDINGS: The lungs are well-expanded. There are small bilateral pleural effusions. The retrocardiac region on the left remains dense. The cardiac silhouette remains enlarged. The pulmonary vascularity is engorged centrally. There is bilateral hilar prominence. There is calcification in the wall of the thoracic aorta. The bony thorax exhibits no acute abnormality. IMPRESSION: CHF superimposed upon COPD and smoking related changes. Left lower lobe atelectasis or pneumonia. Small bilateral pleural effusions. Thoracic aortic atherosclerosis. Electronically Signed   By: David  Martinique M.D.   On: 11/05/2016 08:41  Dg Chest Portable 1 View  Result Date: 11/03/2016 CLINICAL DATA:  Altered mental status, shortness of breath, hypertension, diabetes mellitus, former smoker, CHF, history lymphoma EXAM: PORTABLE CHEST 1 VIEW COMPARISON:  Portable exam 2204 hours compared to 10/29/2016 FINDINGS: Enlargement of cardiac silhouette with mild pulmonary vascular congestion. Atherosclerotic calcification aorta. Mediastinal contours and pulmonary vascularity normal. Persistent atelectasis versus infiltrate in LEFT lower lobe. Minimal perihilar edema. Peribronchial thickening persists. Mild RIGHT basilar atelectasis. No gross pleural effusion or pneumothorax. Bones demineralized. IMPRESSION: Enlargement of cardiac silhouette with pulmonary vascular  congestion and question minimal pulmonary edema. RIGHT basilar atelectasis with persistent atelectasis versus consolidation in LEFT lower lobe. Electronically Signed   By: Lavonia Dana M.D.   On: 11/03/2016 22:33   Dg Chest Port 1 View  Result Date: 10/29/2016 CLINICAL DATA:  Respiratory failure EXAM: PORTABLE CHEST 1 VIEW COMPARISON:  10/28/2016 FINDINGS: The heart remains markedly enlarged. Pulmonary edema and vascular congestion are stable. Bilateral small pleural effusions are stable. No pneumothorax. Aortic atherosclerotic calcifications are again noted. IMPRESSION: Stable CHF with small bilateral pleural effusions. Electronically Signed   By: Marybelle Killings M.D.   On: 10/29/2016 07:28   Dg Chest Port 1 View  Result Date: 10/28/2016 CLINICAL DATA:  Shortness of breath.  Difficulty breathing. EXAM: PORTABLE CHEST 1 VIEW COMPARISON:  10/27/2016 and 10/25/2016. FINDINGS: 1301 hours. Cardiomegaly and aortic atherosclerosis are again noted. There is central enlargement of the pulmonary vasculature with mild pulmonary edema and small bilateral pleural effusions. Probable head left-greater-than-right basilar atelectasis is unchanged. There is no pneumothorax. The bones appear stable. IMPRESSION: Little change from previous study. Persistent findings compatible with congestive heart failure with bilateral pleural effusions. Electronically Signed   By: Richardean Sale M.D.   On: 10/28/2016 13:47   Dg Chest Port 1 View  Result Date: 10/27/2016 CLINICAL DATA:  Encounter for aspiration for pneumonia EXAM: PORTABLE CHEST 1 VIEW COMPARISON:  10/25/2016 FINDINGS: The heart is enlarged. There is pulmonary vascular congestion and probable mild edema. There are bilateral pleural effusions. Opacity at the left lung base partially obscures the hemidiaphragm, compatible with atelectasis, infiltrate, or edema. IMPRESSION: Cardiomegaly.  Probable mild edema. Left lower lobe atelectasis and/or edema. Electronically Signed    By: Nolon Nations M.D.   On: 10/27/2016 16:24   Dg Chest Port 1 View  Result Date: 10/25/2016 CLINICAL DATA:  Acute shortness of breath and wheezing. EXAM: PORTABLE CHEST 1 VIEW COMPARISON:  10/05/2016 and prior radiographs FINDINGS: Cardiomegaly and fullness of both hilar regions again noted. New interstitial opacities are present compatible with interstitial edema. There may be trace bilateral pleural effusions present. Mild bibasilar atelectasis noted. There is no evidence of pneumothorax. No acute bony abnormalities are identified. IMPRESSION: 1. New interstitial opacities compatible with interstitial pulmonary edema. Question trace bilateral pleural effusions. 2. Cardiomegaly. Electronically Signed   By: Margarette Canada M.D.   On: 10/25/2016 13:37    Microbiology: Recent Results (from the past 240 hour(s))  Blood Culture (routine x 2)     Status: None (Preliminary result)   Collection Time: 11/16/16 10:10 AM  Result Value Ref Range Status   Specimen Description BLOOD LEFT FOREARM  Final   Special Requests   Final    BOTTLES DRAWN AEROBIC AND ANAEROBIC Blood Culture adequate volume   Culture NO GROWTH 3 DAYS  Final   Report Status PENDING  Incomplete  Blood Culture (routine x 2)     Status: None (Preliminary result)   Collection Time: 11/16/16 11:15 AM  Result Value Ref Range Status   Specimen Description BLOOD LEFT HAND  Final   Special Requests   Final    BOTTLES DRAWN AEROBIC AND ANAEROBIC Blood Culture adequate volume   Culture NO GROWTH 3 DAYS  Final   Report Status PENDING  Incomplete     Labs: Basic Metabolic Panel:  Recent Labs Lab 11/14/16 0658 11/15/16 0457 11/16/16 1010 11/16/16 1555 11/17/16 0245 11/18/16 0537  NA 150* 149* 154*  --  152* 153*  K 3.5 3.5 3.8  --  3.9 3.8  CL 108 109 111  --  109 112*  CO2 33* 30 35*  --  33* 31  GLUCOSE 206* 190* 190*  --  200* 234*  BUN 61* 65* 65*  --  61* 64*  CREATININE 1.67* 1.70* 1.65* 1.66* 1.68* 2.32*  CALCIUM 10.3  10.3 10.7*  --  10.1 10.2  MG 1.9  --  2.3  --   --   --    Liver Function Tests:  Recent Labs Lab 11/16/16 1010 11/17/16 0245  AST 31 25  ALT 18 14  ALKPHOS 101 79  BILITOT 0.6 0.8  PROT 7.3 6.7  ALBUMIN 2.7* 2.5*   No results for input(s): LIPASE, AMYLASE in the last 168 hours. No results for input(s): AMMONIA in the last 168 hours. CBC:  Recent Labs Lab 11/15/16 0457 11/16/16 1010 11/16/16 1555 11/17/16 0245 11/18/16 0537  WBC 8.9 9.2 8.8 8.8 9.8  NEUTROABS  --  7.2  --   --  7.4  HGB 8.7* 9.0* 9.0* 8.8* 9.3*  HCT 31.2* 32.7* 32.4* 32.0* 33.2*  MCV 86.4 85.8 86.4 85.8 84.9  PLT 227 212 214 212 228   Cardiac Enzymes: No results for input(s): CKTOTAL, CKMB, CKMBINDEX, TROPONINI in the last 168 hours. BNP: BNP (last 3 results)  Recent Labs  11/04/16 0217 11/10/16 1531 11/18/16 0537  BNP 1,841.2* 632.3* 1,213.4*    ProBNP (last 3 results) No results for input(s): PROBNP in the last 8760 hours.  CBG:  Recent Labs Lab 11/15/16 1652 11/16/16 2102 11/17/16 1631 11/18/16 0600 11/18/16 2046  GLUCAP 195* 188* 198* 214* 182*       Signed:  Alma Friendly, MD [facility] , 10:52 AM

## 2016-11-20 NOTE — Progress Notes (Signed)
[facility] Liaison: RN visit ° °Received request from CSW for family interest in [facility] with request for transfer today. Chart reviewed. Met with family to confirm interest and explain services. Family agreeable to transfer today. [place] CSW aware  Registration paper work completed. Dr. Donald Hertweck to assume care per family request. Please fax discharge summary to [phone]. RN please call report to [phone]. Please arrange transport for patient to arrive as soon as possible.  ° °Thank you, ° °Mary Anne Robertson, RN, CCM °[facility] Hospital Liaison °[phone] ° °All hospital liaisons are on AMION.  ° °

## 2016-11-20 NOTE — Progress Notes (Signed)
Pt discharged to [facility] per order. IV and telemetry removed. Pt transported via [facility]. Report given to receiving nurse and all questions were answered. Pt left with all of her belongings.    Grant Fontana BSN, RN

## 2016-11-21 ENCOUNTER — Telehealth: Payer: Self-pay

## 2016-11-21 LAB — CULTURE, BLOOD (ROUTINE X 2)
CULTURE: NO GROWTH
Culture: NO GROWTH
Special Requests: ADEQUATE
Special Requests: ADEQUATE

## 2016-11-21 NOTE — Telephone Encounter (Signed)
Possible re-admission to facility. This is a patient you were seeing at [facility] . [facility] F/U is needed if patient was re-admitted to facility upon discharge. Hospital discharge from [facility] on .

## 2016-11-29 DEATH — deceased

## 2016-12-29 DEATH — deceased

## 2017-09-18 ENCOUNTER — Encounter: Payer: Self-pay | Admitting: Internal Medicine

## 2019-07-18 IMAGING — DX DG ABDOMEN 1V
1 series · 1 of 1 positions shown · non-contrast
Comparison: Yesterday.

CLINICAL DATA: Nasogastric tube placement.

EXAM:
ABDOMEN - 1 VIEW

[abdomen kub]
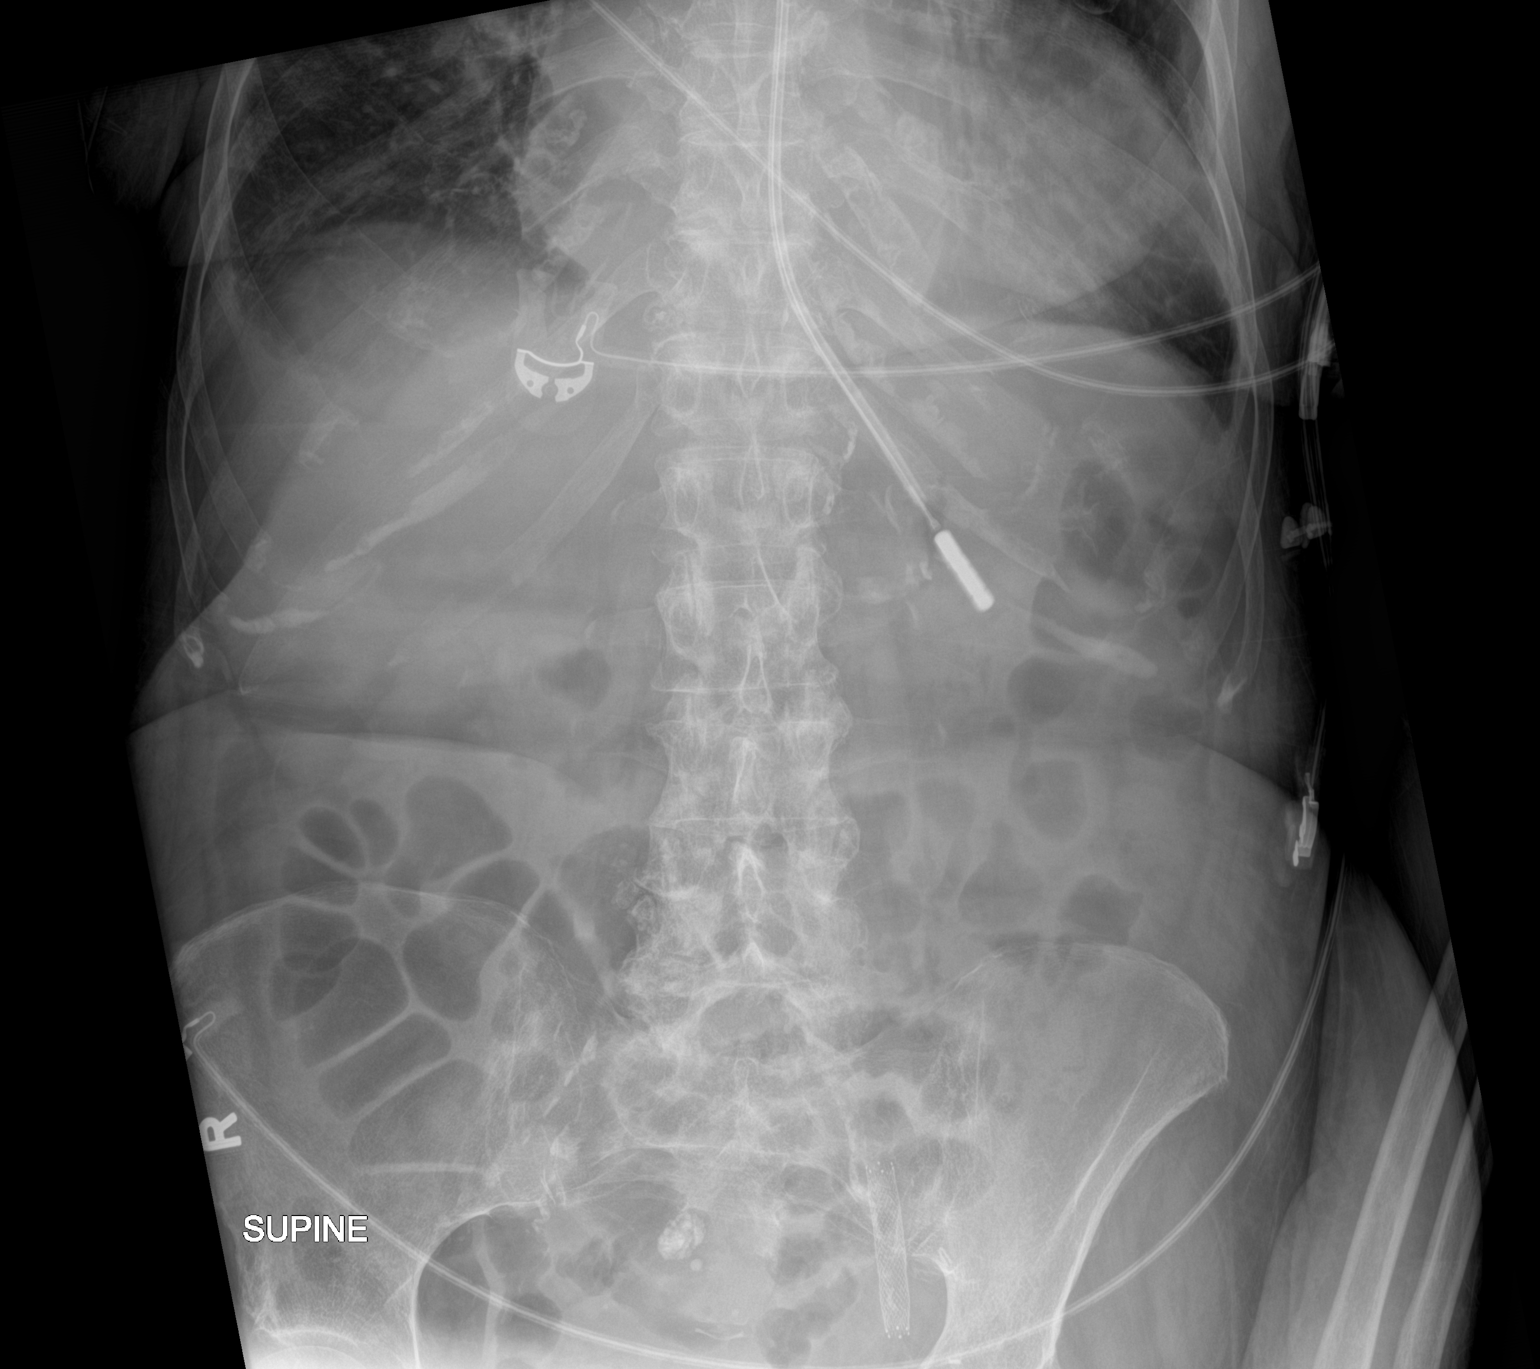

[1 of 1 positions shown; findings below may reference images not displayed]

FINDINGS: Feeding tube tip with its tip in scratch sec peak 2 tip in the mid
stomach. No a nasogastric tube is seen. Normal bowel gas pattern.
Left iliac stent. Lumbar spine degenerative changes. Small to
moderate-sized right pleural effusion, increased. Interval small
left pleural effusion.
IMPRESSION: 1. Feeding tube tip in the mid stomach.
2. No nasogastric tube seen.
3. Small moderate-sized right pleural effusion and small left
pleural effusion.

## 2019-07-19 IMAGING — RF DG SWALLOWING FUNCTION - NRPT [facility]
12 of 14 series · 21 of 24 positions shown · non-contrast
Comparison: none

[Series 1: cp_standard · 0.26mm/px · 2 acquisitions, 2 frames shown (1 of 12)]
[im 1/2]
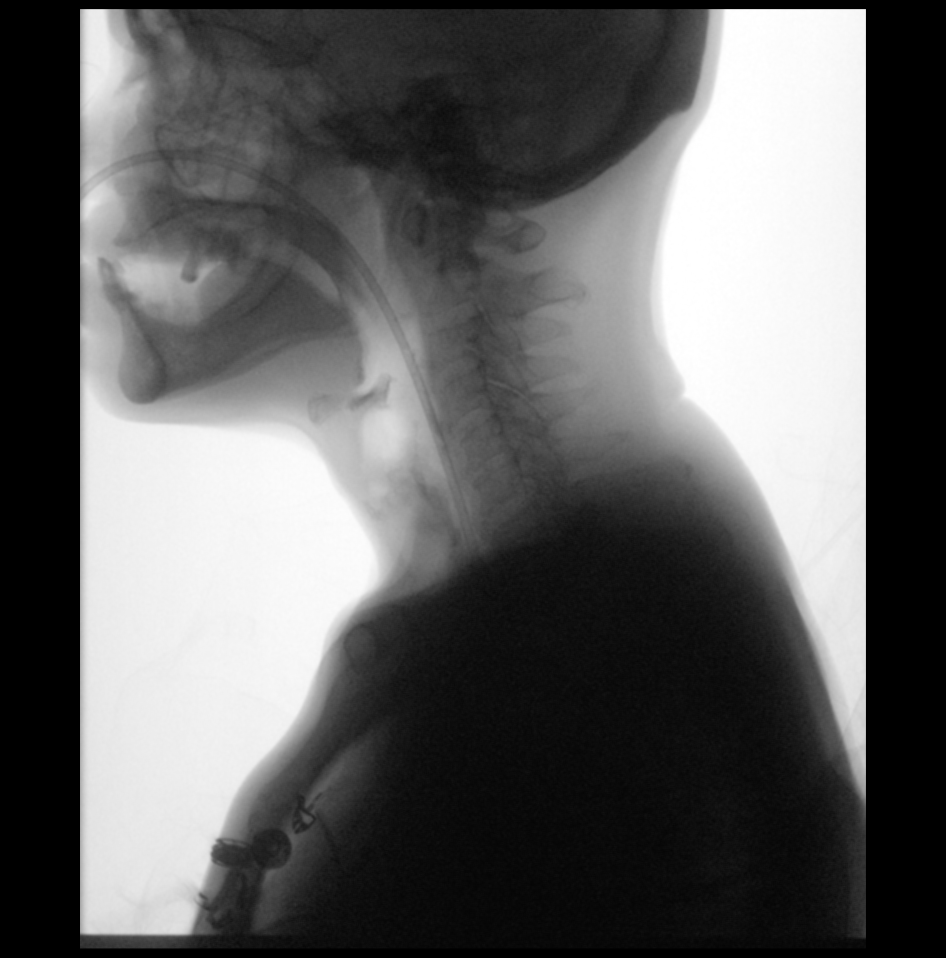
[im 2/2]
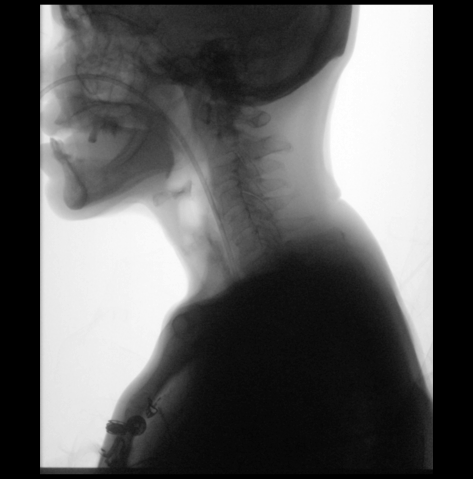

[Series 3: cp_standard · 0.51mm/px · 1 of 37 frames shown (2 of 12)]
[frame 6/37]
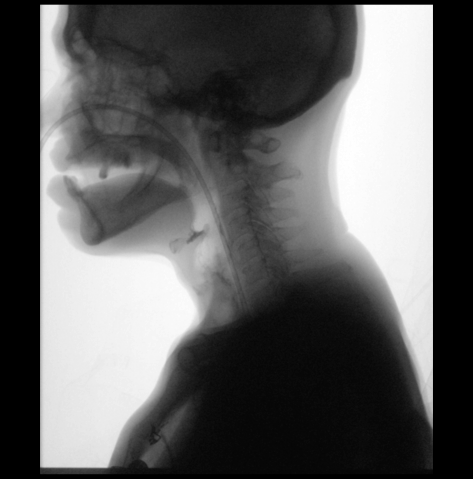

[Series 4: cp_standard · 0.51mm/px · 2 of 332 frames shown (3 of 12)]
[frame 167/332]
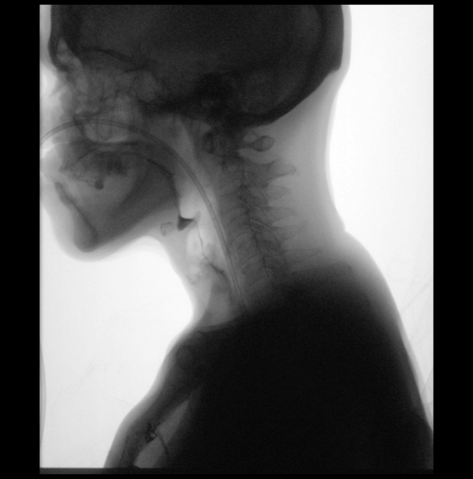
[frame 314/332]
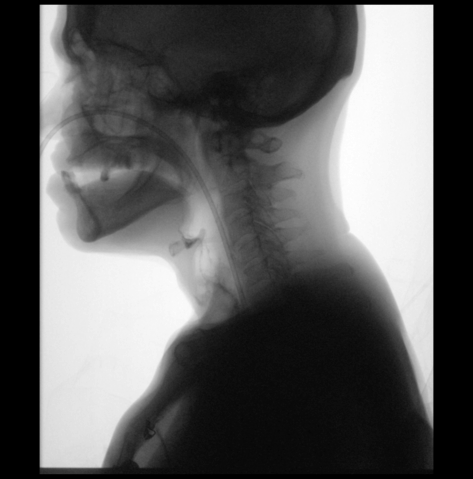

[Series 5: cp_standard · 0.51mm/px · 1 of 305 frames shown (4 of 12)]
[frame 153/305]
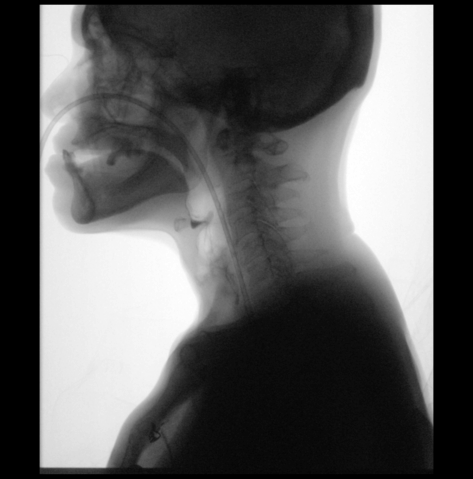

[Series 6: cp_standard · 0.51mm/px · 2 of 203 frames shown (5 of 12)]
[frame 1/203]
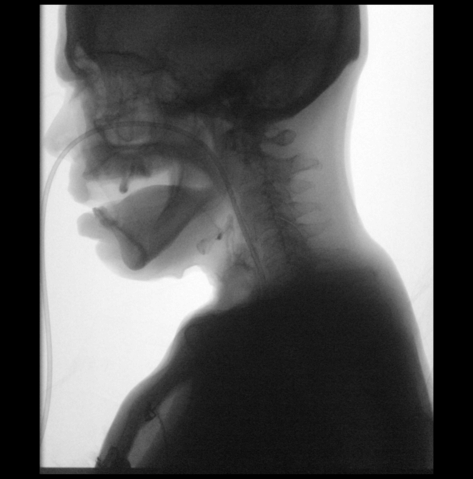
[frame 102/203]
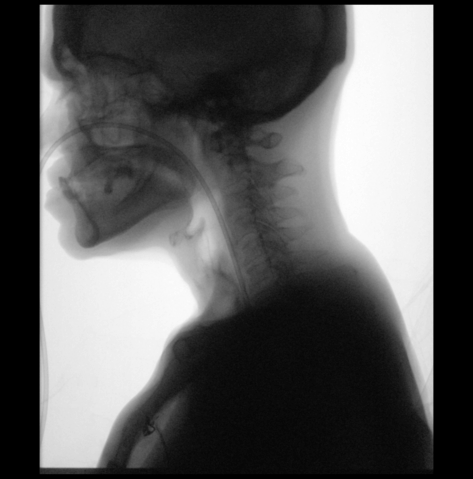

[Series 7: cp_standard · 0.51mm/px · 2 of 445 frames shown (6 of 12)]
[frame 69/445]
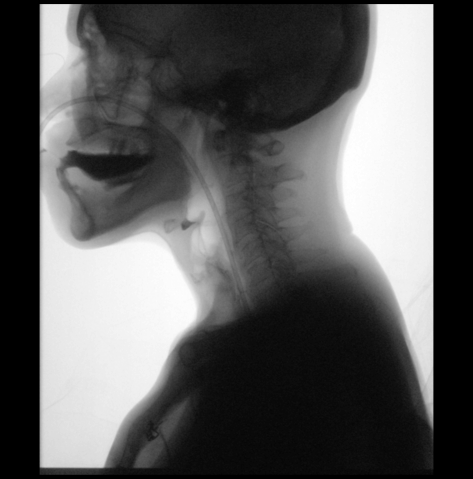
[frame 379/445]
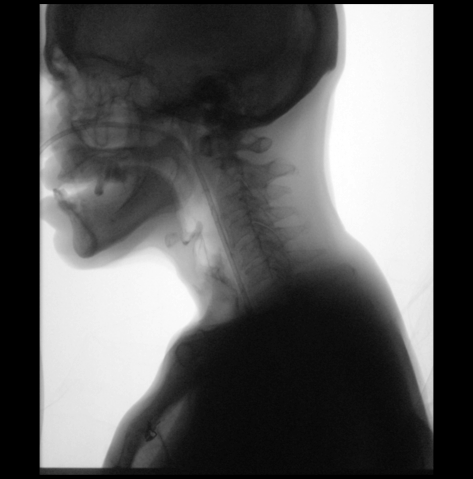

[Series 9: cp_standard · 0.51mm/px · 2 of 148 frames shown (7 of 12)]
[frame 23/148]
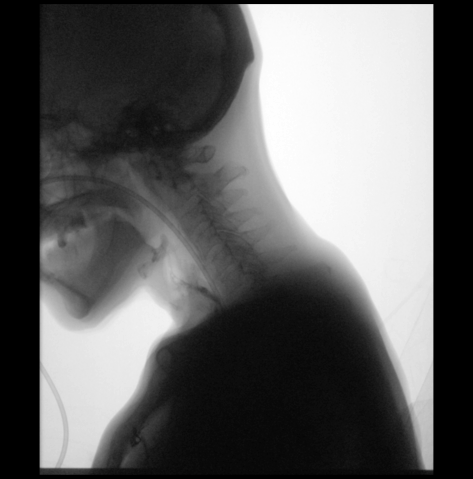
[frame 143/148]
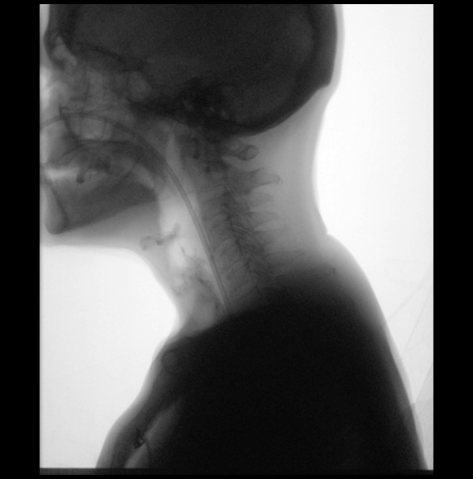

[Series 10: cp_standard · 0.51mm/px · 1 of 95 frames shown (8 of 12)]
[frame 24/95]
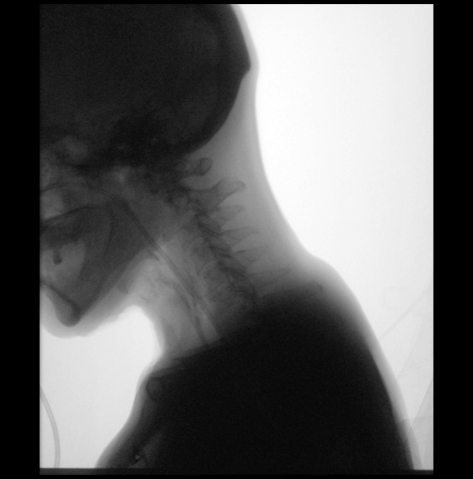

[Series 11: cp_standard · 0.51mm/px · 2 of 224 frames shown (9 of 12)]
[frame 34/224]
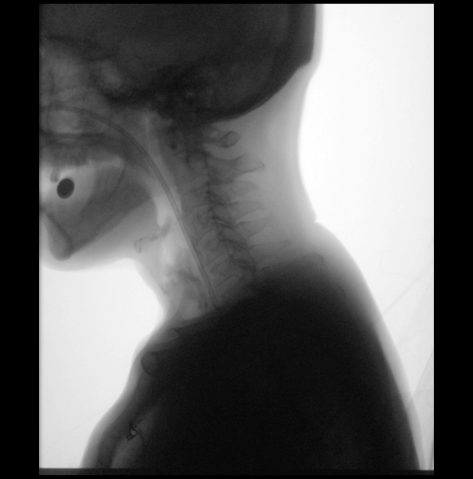
[frame 176/224]
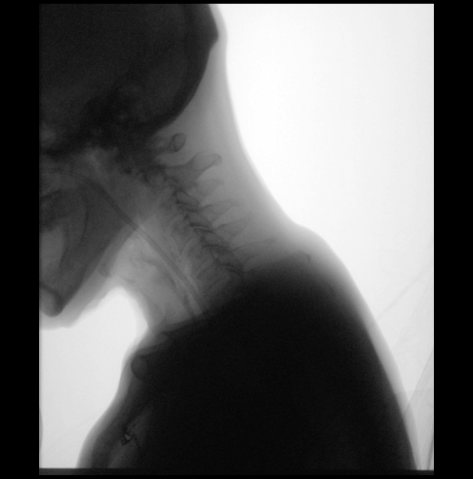

[Series 12: cp_standard · 0.51mm/px · 2 of 314 frames shown (10 of 12)]
[frame 48/314]
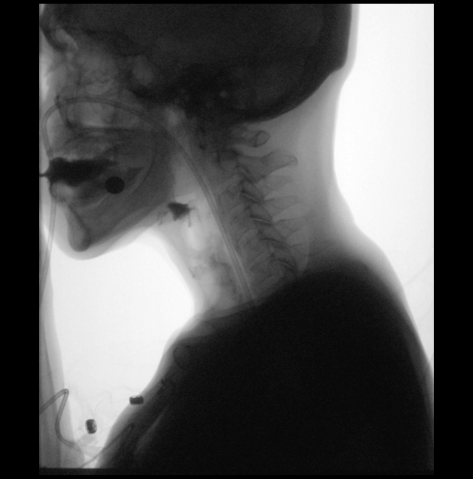
[frame 267/314]
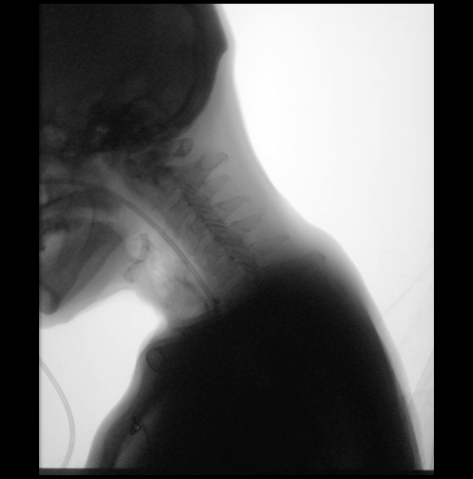

[Series 14: cp_standard · 0.51mm/px · 2 of 231 frames shown (11 of 12)]
[frame 14/231]
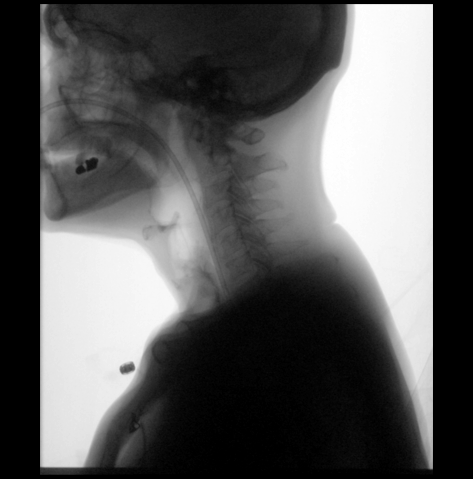
[frame 116/231]
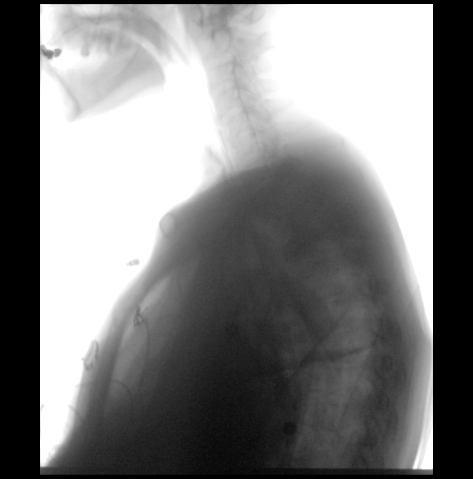

[Series 15: cp_standard · 0.51mm/px · 2 of 590 frames shown (12 of 12)]
[frame 251/590]
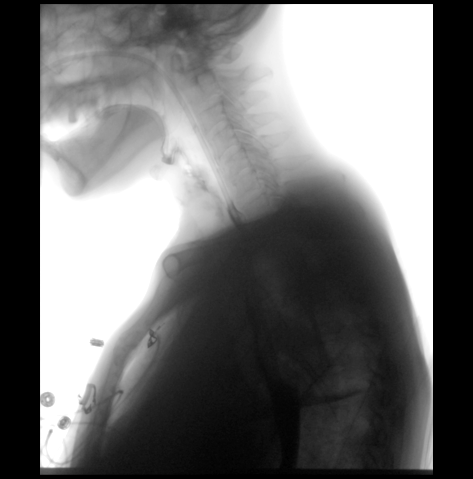
[frame 502/590]
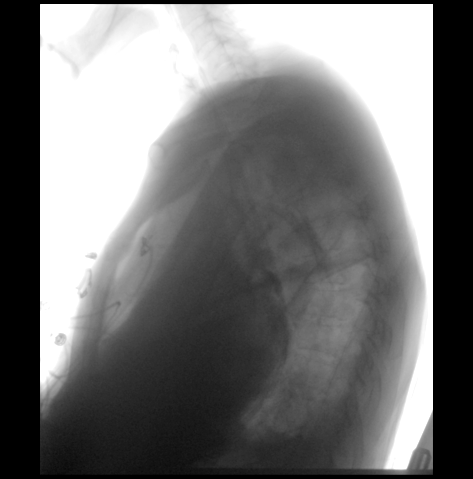

[21 of 24 positions shown; findings below may reference images not displayed]

FLUOROSCOPY FOR SWALLOWING FUNCTION STUDY:
Fluoroscopy was provided for swallowing function study, which was administered by a speech pathologist.  Final results and recommendations from this study are contained within the speech pathology report.

## 2019-07-27 IMAGING — DX DG CHEST 1V PORT
1 series · 1 of 1 positions shown · non-contrast
Comparison: 09/24/2016

CLINICAL DATA: SOb today

EXAM:
PORTABLE CHEST - 1 VIEW

[chest ap]
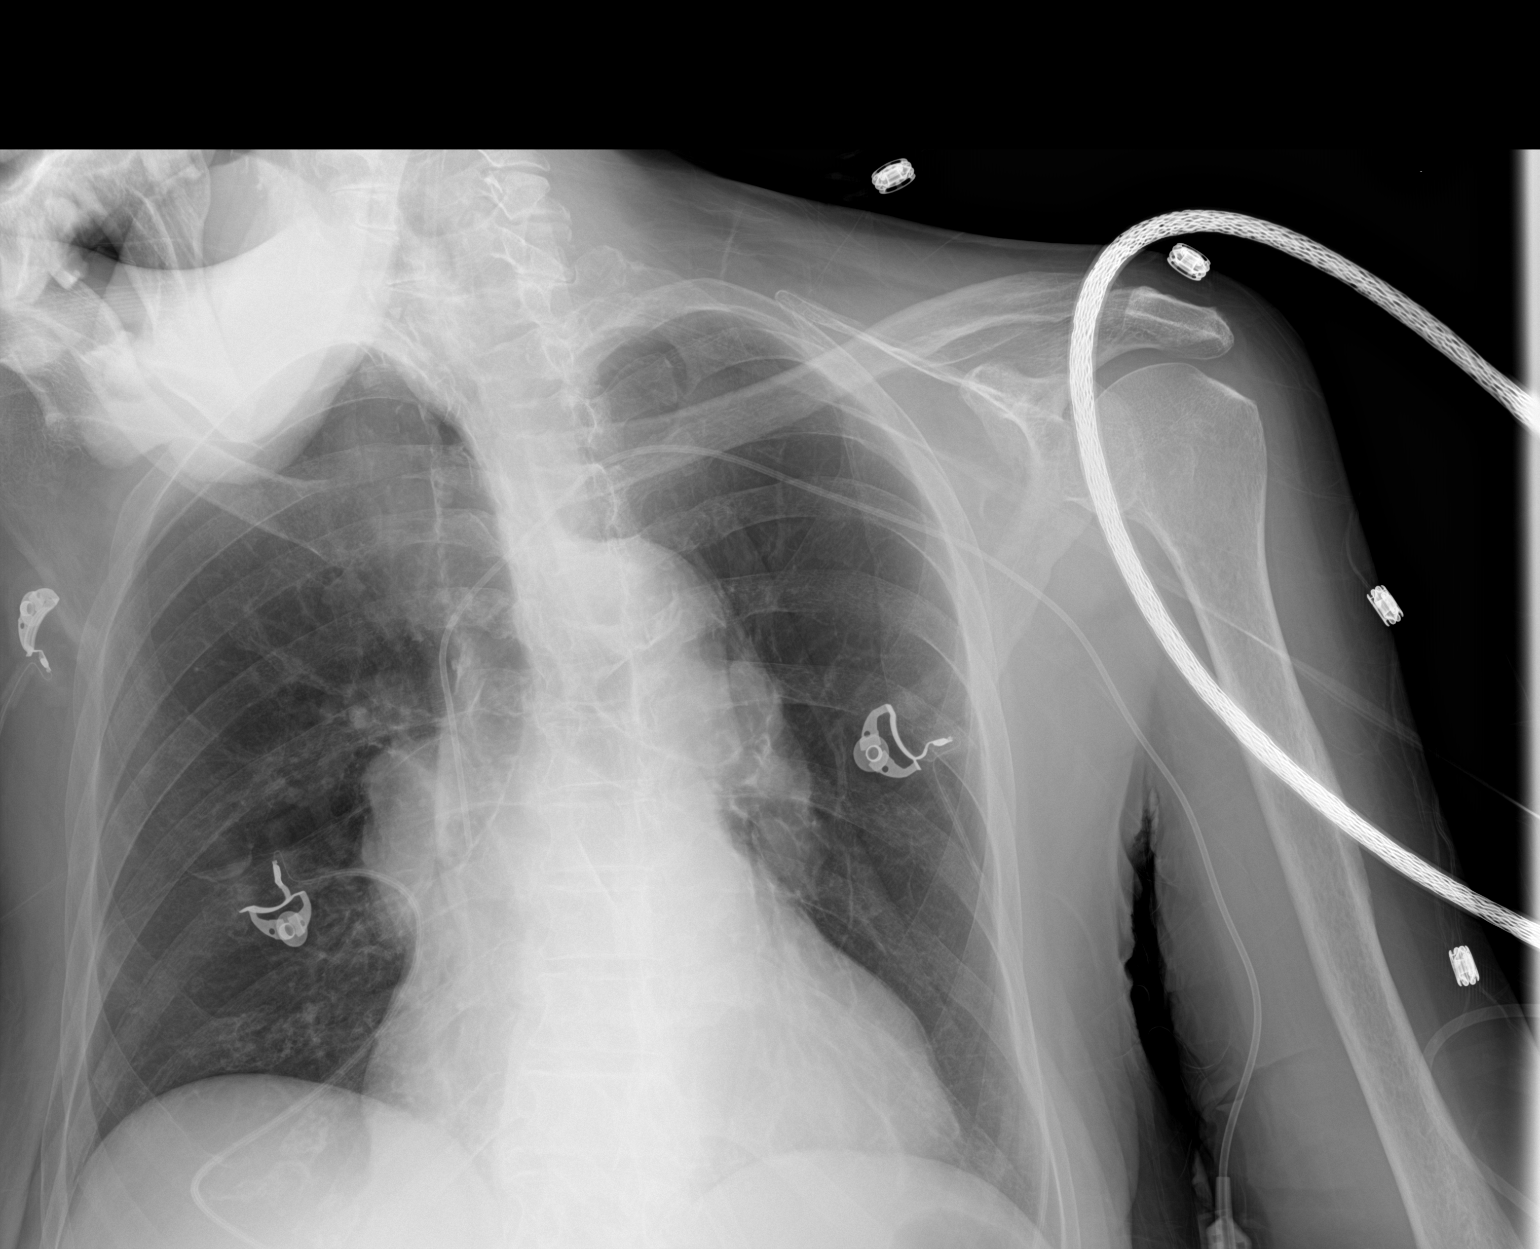

[1 of 1 positions shown; findings below may reference images not displayed]

FINDINGS: Lungs are clear.  Left arm PICC line to the distal SVC as before.

Heart size and mediastinal contours are within normal limits.
Atheromatous aorta.

No effusion.  No pneumothorax.

Visualized bones unremarkable.
IMPRESSION: No acute cardiopulmonary disease.

Aortic Atherosclerosis (39L01-170.0)
